# Patient Record
Sex: Female | Born: 1965 | Race: White | Hispanic: No | Marital: Married | Smoking: Never smoker
Health system: Southern US, Community
[De-identification: ages and names within clinical notes are randomized; demographics above are authoritative.]

## PROBLEM LIST (undated history)

## (undated) DIAGNOSIS — G43109 Migraine with aura, not intractable, without status migrainosus: Secondary | ICD-10-CM

## (undated) DIAGNOSIS — F32A Depression, unspecified: Secondary | ICD-10-CM

## (undated) DIAGNOSIS — D649 Anemia, unspecified: Secondary | ICD-10-CM

## (undated) DIAGNOSIS — N390 Urinary tract infection, site not specified: Secondary | ICD-10-CM

## (undated) DIAGNOSIS — K219 Gastro-esophageal reflux disease without esophagitis: Secondary | ICD-10-CM

## (undated) DIAGNOSIS — I10 Essential (primary) hypertension: Secondary | ICD-10-CM

## (undated) DIAGNOSIS — R030 Elevated blood-pressure reading, without diagnosis of hypertension: Secondary | ICD-10-CM

## (undated) DIAGNOSIS — F329 Major depressive disorder, single episode, unspecified: Secondary | ICD-10-CM

## (undated) DIAGNOSIS — Z8489 Family history of other specified conditions: Secondary | ICD-10-CM

## (undated) DIAGNOSIS — K900 Celiac disease: Secondary | ICD-10-CM

## (undated) HISTORY — DX: Celiac disease: K90.0

## (undated) HISTORY — PX: EXPLORATORY LAPAROTOMY: SUR591

## (undated) HISTORY — PX: BLADDER SUSPENSION: SHX72

## (undated) HISTORY — DX: Essential (primary) hypertension: I10

## (undated) HISTORY — DX: Migraine with aura, not intractable, without status migrainosus: G43.109

## (undated) HISTORY — DX: Gastro-esophageal reflux disease without esophagitis: K21.9

## (undated) HISTORY — PX: ABDOMINAL HYSTERECTOMY: SHX81

## (undated) HISTORY — PX: TUBAL LIGATION: SHX77

## (undated) HISTORY — DX: Urinary tract infection, site not specified: N39.0

---
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
## 1997-11-26 ENCOUNTER — Emergency Department (HOSPITAL_COMMUNITY): Admission: EM | Admit: 1997-11-26 | Discharge: 1997-11-26 | Payer: Self-pay | Admitting: Emergency Medicine

## 1998-05-04 ENCOUNTER — Emergency Department (HOSPITAL_COMMUNITY): Admission: EM | Admit: 1998-05-04 | Discharge: 1998-05-04 | Payer: Self-pay | Admitting: Emergency Medicine

## 1998-06-11 ENCOUNTER — Other Ambulatory Visit: Admission: RE | Admit: 1998-06-11 | Discharge: 1998-06-11 | Payer: Self-pay | Admitting: Emergency Medicine

## 1999-07-09 ENCOUNTER — Emergency Department (HOSPITAL_COMMUNITY): Admission: EM | Admit: 1999-07-09 | Discharge: 1999-07-10 | Payer: Self-pay | Admitting: Emergency Medicine

## 2000-12-02 ENCOUNTER — Emergency Department (HOSPITAL_COMMUNITY): Admission: EM | Admit: 2000-12-02 | Discharge: 2000-12-02 | Payer: Self-pay | Admitting: Emergency Medicine

## 2005-01-06 ENCOUNTER — Ambulatory Visit (HOSPITAL_COMMUNITY): Admission: RE | Admit: 2005-01-06 | Discharge: 2005-01-06 | Payer: Self-pay | Admitting: Urology

## 2005-01-06 ENCOUNTER — Ambulatory Visit (HOSPITAL_BASED_OUTPATIENT_CLINIC_OR_DEPARTMENT_OTHER): Admission: RE | Admit: 2005-01-06 | Discharge: 2005-01-06 | Payer: Self-pay | Admitting: Urology

## 2005-09-12 ENCOUNTER — Ambulatory Visit: Payer: Self-pay | Admitting: Internal Medicine

## 2005-09-12 ENCOUNTER — Inpatient Hospital Stay (HOSPITAL_COMMUNITY): Admission: AD | Admit: 2005-09-12 | Discharge: 2005-09-14 | Payer: Self-pay | Admitting: Family Medicine

## 2010-07-01 ENCOUNTER — Emergency Department (HOSPITAL_COMMUNITY)
Admission: EM | Admit: 2010-07-01 | Discharge: 2010-07-01 | Disposition: A | Discharge status: Home / Self Care | Payer: Self-pay | Attending: Emergency Medicine | Admitting: Emergency Medicine

## 2010-07-01 DIAGNOSIS — Y92009 Unspecified place in unspecified non-institutional (private) residence as the place of occurrence of the external cause: Secondary | ICD-10-CM | POA: Insufficient documentation

## 2010-07-01 DIAGNOSIS — S058X9A Other injuries of unspecified eye and orbit, initial encounter: Secondary | ICD-10-CM | POA: Insufficient documentation

## 2010-07-01 DIAGNOSIS — T1590XA Foreign body on external eye, part unspecified, unspecified eye, initial encounter: Secondary | ICD-10-CM | POA: Insufficient documentation

## 2011-04-09 ENCOUNTER — Ambulatory Visit (INDEPENDENT_AMBULATORY_CARE_PROVIDER_SITE_OTHER): Payer: BC Managed Care – PPO

## 2011-04-09 DIAGNOSIS — J45909 Unspecified asthma, uncomplicated: Secondary | ICD-10-CM

## 2011-04-11 ENCOUNTER — Ambulatory Visit (INDEPENDENT_AMBULATORY_CARE_PROVIDER_SITE_OTHER): Payer: BC Managed Care – PPO

## 2011-04-11 DIAGNOSIS — R05 Cough: Secondary | ICD-10-CM

## 2011-04-11 DIAGNOSIS — F411 Generalized anxiety disorder: Secondary | ICD-10-CM

## 2011-04-11 DIAGNOSIS — R059 Cough, unspecified: Secondary | ICD-10-CM

## 2011-04-11 DIAGNOSIS — S61209A Unspecified open wound of unspecified finger without damage to nail, initial encounter: Secondary | ICD-10-CM

## 2011-04-19 ENCOUNTER — Ambulatory Visit (INDEPENDENT_AMBULATORY_CARE_PROVIDER_SITE_OTHER): Payer: BC Managed Care – PPO

## 2011-04-19 DIAGNOSIS — S61209A Unspecified open wound of unspecified finger without damage to nail, initial encounter: Secondary | ICD-10-CM

## 2011-04-20 ENCOUNTER — Ambulatory Visit (INDEPENDENT_AMBULATORY_CARE_PROVIDER_SITE_OTHER): Payer: BC Managed Care – PPO

## 2011-04-20 DIAGNOSIS — R300 Dysuria: Secondary | ICD-10-CM

## 2011-04-20 DIAGNOSIS — G43709 Chronic migraine without aura, not intractable, without status migrainosus: Secondary | ICD-10-CM

## 2011-04-20 DIAGNOSIS — R112 Nausea with vomiting, unspecified: Secondary | ICD-10-CM

## 2011-05-18 ENCOUNTER — Ambulatory Visit: Payer: BC Managed Care – PPO

## 2011-05-18 ENCOUNTER — Ambulatory Visit (INDEPENDENT_AMBULATORY_CARE_PROVIDER_SITE_OTHER): Payer: BC Managed Care – PPO | Admitting: Family Medicine

## 2011-05-18 VITALS — BP 120/88 | HR 60 | Temp 98.7°F | Resp 16 | Ht 60.25 in | Wt 154.0 lb

## 2011-05-18 DIAGNOSIS — R112 Nausea with vomiting, unspecified: Secondary | ICD-10-CM

## 2011-05-18 DIAGNOSIS — K5900 Constipation, unspecified: Secondary | ICD-10-CM

## 2011-05-18 LAB — GLUCOSE, POCT (MANUAL RESULT ENTRY): POC Glucose: 80

## 2011-05-18 MED ORDER — PROMETHAZINE HCL 25 MG PO TABS: 25.0000 mg | ORAL_TABLET | Freq: Three times a day (TID) | ORAL | Status: DC | PRN

## 2011-05-18 NOTE — Progress Notes (Signed)
  Subjective:    Patient ID: [name], female    DOB: Sep 03, 1965, 46 y.o.   MRN: [id number]  HPI 46 yo female with c/o nausea for 3-4 months.  Daily.  Gags, occasionally vomits.  Comes and goes throughout the day.  Worse with movement, worse with bending over.  Better with sitting up and sitting still.  Sometimes will last several hours.  No relation to food - doesn't help or hurt.  No associated abdominal pain.  H/O migraines, decreased in frequency to 1-2/month since starting atenolol.  Sometimes feels lightheaded or weak with it.  No visual disturbance.  Frequent constipation - has had colonsocopy in last 1-2 years.  Was told to take miralax and dulcolax daily.  Nausea improved for awhile on them but then she stopped taking them and nausea returned.   Also feels heartburn symptoms with the nausea.     Review of Systems Negative except as per HPI     Objective:   Physical Exam  Constitutional: Vital signs are normal. She appears well-developed and well-nourished. She is active.  Cardiovascular: Normal rate, regular rhythm, normal heart sounds and normal pulses.   Pulmonary/Chest: Effort normal and breath sounds normal.  Abdominal: Soft. Normal appearance and bowel sounds are normal. She exhibits no distension and no mass. There is no hepatosplenomegaly. There is tenderness. There is no rigidity, no rebound, no guarding, no CVA tenderness, no tenderness at McBurney's point and negative Murphy's sign. No hernia.  Neurological: She is alert.   Abd tenderness is mild and generalized  [facility] Primary radiology reading by Dr. Georgiana Shore: mild scoliosis noted.  Heavy stool load - constipation.      Assessment & Plan:  Nausea/vomitting, exacerbated by a nything that compresses abdominal cavity.  Suspect due to constipation (history of this).  Resume miralax, dulcolax as directed by GI.  Phenergan for nausea at night while treating constipation. Avoid zofran due to constipating side effect.  If no  improvement in 10-14 days, RTC.

## 2011-05-28 ENCOUNTER — Encounter: Payer: Self-pay | Admitting: Physician Assistant

## 2011-05-28 ENCOUNTER — Ambulatory Visit (INDEPENDENT_AMBULATORY_CARE_PROVIDER_SITE_OTHER): Payer: BC Managed Care – PPO | Admitting: Physician Assistant

## 2011-05-28 VITALS — BP 114/70 | HR 76 | Temp 98.0°F | Resp 16 | Ht 60.5 in | Wt 156.0 lb

## 2011-05-28 DIAGNOSIS — R112 Nausea with vomiting, unspecified: Secondary | ICD-10-CM

## 2011-05-28 MED ORDER — METOCLOPRAMIDE HCL 5 MG PO TABS: ORAL_TABLET | ORAL | Status: DC

## 2011-05-28 NOTE — Progress Notes (Signed)
  Subjective:    Patient ID: [name], female    DOB: [birth year], 46 y.o.   MRN: [id number]  HPI    Review of Systems     Objective:   Physical Exam        Assessment & Plan:

## 2011-05-28 NOTE — Progress Notes (Signed)
  Subjective:    Patient ID: [name], female    DOB: 05/13/1965, 46 y.o.   MRN: [id number]  HPI Tariah is here for recheck of n/v. States her symptoms worsened last night.  More frequent and prolonged episodes. Phenergan has not helped. Episodes occur mainly at night after dinner despite size of meal.  Has used Dulcolax and Mag Citrate for constipation recently as she felt more bloated higher up in abdomen.   She has long h/o constipation.   Review of Systems  Constitutional: Negative for fever and chills.  Gastrointestinal: Positive for nausea, vomiting, abdominal pain, constipation and abdominal distention. Negative for blood in stool.  Musculoskeletal: Negative for myalgias.  Neurological: Negative for headaches.       Objective:   Physical Exam  Constitutional: She appears well-developed and well-nourished.  HENT:  Mouth/Throat: Oropharynx is clear and moist.  Cardiovascular: Normal rate and regular rhythm.   Pulmonary/Chest: Effort normal and breath sounds normal.  Abdominal: Soft. Normal appearance and bowel sounds are normal. There is no splenomegaly or hepatomegaly. There is no tenderness. There is no rebound and no CVA tenderness.          Assessment & Plan:  Nausea and vomiting, prolonged for 4 months. ?gastroparesis Constipation, ongoing  Recommend referral to [facility].  D/C phenergan  Start Reglan at night before meals

## 2011-06-25 ENCOUNTER — Ambulatory Visit (INDEPENDENT_AMBULATORY_CARE_PROVIDER_SITE_OTHER): Payer: BC Managed Care – PPO | Admitting: Family Medicine

## 2011-06-25 VITALS — BP 134/86 | HR 87 | Temp 97.5°F | Resp 16 | Ht 60.5 in | Wt 157.0 lb

## 2011-06-25 DIAGNOSIS — M25511 Pain in right shoulder: Secondary | ICD-10-CM

## 2011-06-25 DIAGNOSIS — K5909 Other constipation: Secondary | ICD-10-CM | POA: Insufficient documentation

## 2011-06-25 DIAGNOSIS — K5900 Constipation, unspecified: Secondary | ICD-10-CM

## 2011-06-25 DIAGNOSIS — M25519 Pain in unspecified shoulder: Secondary | ICD-10-CM

## 2011-06-25 MED ORDER — METHYLPREDNISOLONE 4 MG PO KIT: PACK | ORAL | Status: AC

## 2011-06-25 NOTE — Patient Instructions (Signed)
culturelle   Constipation in Adults Constipation is having fewer than 2 bowel movements per week. Usually, the stools are hard. As we grow older, constipation is more common. If you try to fix constipation with laxatives, the problem may get worse. This is because laxatives taken over a long period of time make the colon muscles weaker. A low-fiber diet, not taking in enough fluids, and taking some medicines may make these problems worse. MEDICATIONS THAT MAY CAUSE CONSTIPATION  Water pills (diuretics).   Calcium channel blockers (used to control blood pressure and for the heart).   Certain pain medicines (narcotics).   Anticholinergics.   Anti-inflammatory agents.   Antacids that contain aluminum.  DISEASES THAT CONTRIBUTE TO CONSTIPATION  Diabetes.   Parkinson's disease.   Dementia.   Stroke.   Depression.   Illnesses that cause problems with salt and water metabolism.  HOME CARE INSTRUCTIONS   Constipation is usually best cared for without medicines. Increasing dietary fiber and eating more fruits and vegetables is the best way to manage constipation.   Slowly increase fiber intake to 25 to 38 grams per day. Whole grains, fruits, vegetables, and legumes are good sources of fiber. A dietitian can further help you incorporate high-fiber foods into your diet.   Drink enough water and fluids to keep your urine clear or pale yellow.   A fiber supplement may be added to your diet if you cannot get enough fiber from foods.   Increasing your activities also helps improve regularity.   Suppositories, as suggested by your caregiver, will also help. If you are using antacids, such as aluminum or calcium containing products, it will be helpful to switch to products containing magnesium if your caregiver says it is okay.   If you have been given a liquid injection (enema) today, this is only a temporary measure. It should not be relied on for treatment of longstanding (chronic)  constipation.   Stronger measures, such as magnesium sulfate, should be avoided if possible. This may cause uncontrollable diarrhea. Using magnesium sulfate may not allow you time to make it to the bathroom.  SEEK IMMEDIATE MEDICAL CARE IF:   There is bright red blood in the stool.   The constipation stays for more than 4 days.   There is belly (abdominal) or rectal pain.   You do not seem to be getting better.   You have any questions or concerns.  MAKE SURE YOU:   Understand these instructions.   Will watch your condition.   Will get help right away if you are not doing well or get worse.  Document Released: 12/26/2003 Document Revised: 03/18/2011 Document Reviewed: 03/02/2011 [facility] Patient Information 2012 [place], [place].

## 2011-06-25 NOTE — Progress Notes (Signed)
46-year-old woman with 6 months of right shoulder pain. The pain is intermittent and usually comes when she is moving her shoulder by reaching behind her or overhead. Occasion the pain is anterior in nature the pain is sharp. Remember any specific injury that precipitated the pain.  Patient also notes chronic constipation. MiraLax which has done very little to help her. She's also taking Correctol and other over-the-counter laxatives which have worked better he stands up these cause chronic long-term problems. She's had a colonoscopy in the past which was unrevealing. She has not had a bowel movement in the last week. Directive:  Objective: Patient is in no acute distress, she is cooperative and friendly  Examination of the right shoulder reveals full range of motion active and passive, inspection revealed no bony normality. Palpation reveals tenderness in the anterior joint line. Resistance testing shows pain with external rotation.  Abdominal exam is negative  Skin is warm and dry   Assessment: Right shoulder tendinitis and chronic constipation  Plan: GI referral and trial of culture lle  Medrol Dosepak

## 2011-07-30 ENCOUNTER — Emergency Department (HOSPITAL_COMMUNITY): Payer: BC Managed Care – PPO

## 2011-07-30 ENCOUNTER — Encounter (HOSPITAL_COMMUNITY): Payer: Self-pay | Admitting: Emergency Medicine

## 2011-07-30 ENCOUNTER — Emergency Department (HOSPITAL_COMMUNITY)
Admission: EM | Admit: 2011-07-30 | Discharge: 2011-07-31 | Disposition: A | Discharge status: Home / Self Care | Payer: BC Managed Care – PPO | Attending: Emergency Medicine | Admitting: Emergency Medicine

## 2011-07-30 DIAGNOSIS — K921 Melena: Secondary | ICD-10-CM | POA: Insufficient documentation

## 2011-07-30 DIAGNOSIS — R112 Nausea with vomiting, unspecified: Secondary | ICD-10-CM | POA: Insufficient documentation

## 2011-07-30 DIAGNOSIS — K5900 Constipation, unspecified: Secondary | ICD-10-CM | POA: Insufficient documentation

## 2011-07-30 DIAGNOSIS — K5909 Other constipation: Secondary | ICD-10-CM

## 2011-07-30 LAB — DIFFERENTIAL
Basophils Absolute: 0 10*3/uL (ref 0.0–0.1)
Basophils Relative: 0 % (ref 0–1)
Eosinophils Absolute: 0 10*3/uL (ref 0.0–0.7)
Eosinophils Relative: 0 % (ref 0–5)
Lymphocytes Relative: 9 % — ABNORMAL LOW (ref 12–46)
Lymphs Abs: 0.5 10*3/uL — ABNORMAL LOW (ref 0.7–4.0)
Monocytes Absolute: 0.2 10*3/uL (ref 0.1–1.0)
Monocytes Relative: 3 % (ref 3–12)
Neutro Abs: 5.3 10*3/uL (ref 1.7–7.7)
Neutrophils Relative %: 88 % — ABNORMAL HIGH (ref 43–77)

## 2011-07-30 LAB — BASIC METABOLIC PANEL
BUN: 9 mg/dL (ref 6–23)
CO2: 26 mEq/L (ref 19–32)
Calcium: 9 mg/dL (ref 8.4–10.5)
Chloride: 103 mEq/L (ref 96–112)
Creatinine, Ser: 0.7 mg/dL (ref 0.50–1.10)
GFR calc Af Amer: >90 mL/min (ref >90)
GFR calc non Af Amer: >90 mL/min (ref >90)
Glucose, Bld: 92 mg/dL (ref 70–99)
Potassium: 3.5 mEq/L (ref 3.5–5.1)
Sodium: 137 mEq/L (ref 135–145)

## 2011-07-30 LAB — LIPASE, BLOOD: Lipase: 23 U/L (ref 11–59)

## 2011-07-30 LAB — URINALYSIS, ROUTINE W REFLEX MICROSCOPIC
Bilirubin Urine: NEGATIVE
Glucose, UA: NEGATIVE mg/dL
Hgb urine dipstick: NEGATIVE
Ketones, ur: 15 mg/dL — AB
Nitrite: NEGATIVE
Protein, ur: NEGATIVE mg/dL
Specific Gravity, Urine: 1.02 (ref 1.005–1.030)
Urobilinogen, UA: 1 mg/dL (ref 0.0–1.0)
pH: 7.5 (ref 5.0–8.0)

## 2011-07-30 LAB — HEPATIC FUNCTION PANEL
ALT: 11 U/L (ref 0–35)
AST: 13 U/L (ref 0–37)
Albumin: 3.2 g/dL — ABNORMAL LOW (ref 3.5–5.2)
Alkaline Phosphatase: 67 U/L (ref 39–117)
Bilirubin, Direct: 0.1 mg/dL (ref 0.0–0.3)
Indirect Bilirubin: 0.4 mg/dL (ref 0.3–0.9)
Total Bilirubin: 0.5 mg/dL (ref 0.3–1.2)
Total Protein: 6 g/dL (ref 6.0–8.3)

## 2011-07-30 LAB — URINE MICROSCOPIC-ADD ON

## 2011-07-30 LAB — CBC
HCT: 44.1 % (ref 36.0–46.0)
Hemoglobin: 15.1 g/dL — ABNORMAL HIGH (ref 12.0–15.0)
MCH: 33.2 pg (ref 26.0–34.0)
MCHC: 34.2 g/dL (ref 30.0–36.0)
MCV: 96.9 fL (ref 78.0–100.0)
Platelets: 215 10*3/uL (ref 150–400)
RBC: 4.55 MIL/uL (ref 3.87–5.11)
RDW: 12.4 % (ref 11.5–15.5)
WBC: 6 10*3/uL (ref 4.0–10.5)

## 2011-07-30 LAB — POCT PREGNANCY, URINE: Preg Test, Ur: NEGATIVE

## 2011-07-30 LAB — OCCULT BLOOD, POC DEVICE: Fecal Occult Bld: NEGATIVE

## 2011-07-30 MED ORDER — SODIUM CHLORIDE 0.9 % IV BOLUS (SEPSIS)
1000.0000 mL | Freq: Once | INTRAVENOUS | Status: AC
  Administered 2011-07-30: 1000 mL via INTRAVENOUS

## 2011-07-30 MED ORDER — ONDANSETRON HCL 4 MG/2ML IJ SOLN
4.0000 mg | Freq: Once | INTRAMUSCULAR | Status: AC
  Administered 2011-07-30: 4 mg via INTRAVENOUS
  Filled 2011-07-30: qty 2

## 2011-07-30 MED ORDER — ACETAMINOPHEN 325 MG PO TABS
650.0000 mg | ORAL_TABLET | Freq: Once | ORAL | Status: AC
  Administered 2011-07-30: 650 mg via ORAL
  Filled 2011-07-30: qty 2

## 2011-07-30 MED ORDER — MORPHINE SULFATE 4 MG/ML IJ SOLN
4.0000 mg | Freq: Once | INTRAMUSCULAR | Status: AC
  Administered 2011-07-30: 4 mg via INTRAVENOUS
  Filled 2011-07-30: qty 1

## 2011-07-30 NOTE — ED Notes (Signed)
PT states unable to provide urine at this time

## 2011-07-30 NOTE — ED Provider Notes (Signed)
History     CSN: [id number]  Arrival date & time 07/30/11  Lurline Hare   First MD Initiated Contact with Patient 07/30/11 2108      Chief Complaint  Patient presents with  . Emesis    (Consider location/radiation/quality/duration/timing/severity/associated sxs/prior treatment) HPI  46 year old female with history of chronic constipation presents with a chief complaints of constipation. Patient states she does have a history of chronic constipation for the past year. She has been seen by a primary care Dr. multiple times for it. She has been receiving several different types of stool softener to help with the symptoms. She has also been scheduled to have a colonoscopy and endoscopy on April 29 by a GI doctor. However for the past several days she has had increased nausea, vomiting with green bile and had trace of blood in her stool. States that Dr. is aware of her rectal bleeding and accounts to straining. Her last bowel movement was today but a very small amount. Due to vomiting biles, patient was recommended by her sister to come to ED for further evaluation. Patient states she still has her gallbladder intact. Patient denies fever, chills, chest pain, shortness of breath, back pain, dysuria, or rash. She has not had any appetite.  History reviewed. No pertinent past medical history.  History reviewed. No pertinent past surgical history.  No family history on file.  History  Substance Use Topics  . Smoking status: Never Smoker   . Smokeless tobacco: Not on file  . Alcohol Use: No    OB History    Grav Para Term Preterm Abortions TAB SAB Ect Mult Living                  Review of Systems  All other systems reviewed and are negative.    Allergies  Review of patient's allergies indicates no known allergies.  Home Medications   Current Outpatient Rx  Name Route Sig Dispense Refill  . ATENOLOL 25 MG PO TABS Oral Take 25 mg by mouth daily.     . BUPROPION HCL ER (SR) 150 MG PO  TB12 Oral Take 150 mg by mouth 2 (two) times daily.    Marland Kitchen HYOSCYAMINE SULFATE 0.125 MG PO TABS Oral Take 0.125-0.25 mg by mouth every 4 (four) hours as needed. For stomach cramping    . METOCLOPRAMIDE HCL 5 MG PO TABS Oral Take 5 mg by mouth See admin instructions. May take 1 tablet  1-2 hours before dinner and may repeat at bedtime if needed for nausea    . ONDANSETRON 4 MG PO TBDP Oral Take 4 mg by mouth every 8 (eight) hours as needed. For nausea    . PROMETHAZINE HCL 25 MG RE SUPP Rectal Place 25 mg rectally every 4 (four) hours as needed. For nausea    . PROMETHAZINE HCL 25 MG PO TABS Oral Take 1 tablet (25 mg total) by mouth every 8 (eight) hours as needed for nausea. 20 tablet 0    BP 120/70  Pulse 88  Temp(Src) 100.1 F (37.8 C) (Oral)  Resp 20  SpO2 94%  LMP 07/21/2011  Physical Exam  Nursing note and vitals reviewed. Constitutional: She appears well-developed and well-nourished. No distress.       Awake, alert, nontoxic appearance  HENT:  Head: Atraumatic.  Eyes: Conjunctivae are normal. Right eye exhibits no discharge. Left eye exhibits no discharge.  Neck: Neck supple.  Cardiovascular: Normal rate and regular rhythm.   Pulmonary/Chest: Effort normal. No respiratory distress.  She exhibits no tenderness.  Abdominal: Soft. There is tenderness in the suprapubic area and left lower quadrant. There is no rebound, no tenderness at McBurney's point and negative Murphy's sign. No hernia. Hernia confirmed negative in the ventral area.  Musculoskeletal: She exhibits no tenderness.       ROM appears intact, no obvious focal weakness  Neurological:       Mental status and motor strength appears intact  Skin: No rash noted.  Psychiatric: She has a normal mood and affect.    ED Course  Procedures (including critical care time)  Labs Reviewed  CBC - Abnormal; Notable for the following:    Hemoglobin 15.1 (*)    All other components within normal limits  DIFFERENTIAL - Abnormal;  Notable for the following:    Neutrophils Relative 88 (*)    Lymphocytes Relative 9 (*)    Lymphs Abs 0.5 (*)    All other components within normal limits  BASIC METABOLIC PANEL  URINALYSIS, ROUTINE W REFLEX MICROSCOPIC   No results found.   No diagnosis found.  Results for orders placed during the hospital encounter of 07/30/11  URINALYSIS, ROUTINE W REFLEX MICROSCOPIC      Component Value Range   Color, Urine YELLOW  YELLOW    APPearance CLOUDY (*) CLEAR    Specific Gravity, Urine 1.020  1.005 - 1.030    pH 7.5  5.0 - 8.0    Glucose, UA NEGATIVE  NEGATIVE (mg/dL)   Hgb urine dipstick NEGATIVE  NEGATIVE    Bilirubin Urine NEGATIVE  NEGATIVE    Ketones, ur 15 (*) NEGATIVE (mg/dL)   Protein, ur NEGATIVE  NEGATIVE (mg/dL)   Urobilinogen, UA 1.0  0.0 - 1.0 (mg/dL)   Nitrite NEGATIVE  NEGATIVE    Leukocytes, UA MODERATE (*) NEGATIVE   CBC      Component Value Range   WBC 6.0  4.0 - 10.5 (K/uL)   RBC 4.55  3.87 - 5.11 (MIL/uL)   Hemoglobin 15.1 (*) 12.0 - 15.0 (g/dL)   HCT 44.1  36.0 - 46.0 (%)   MCV 96.9  78.0 - 100.0 (fL)   MCH 33.2  26.0 - 34.0 (pg)   MCHC 34.2  30.0 - 36.0 (g/dL)   RDW 12.4  11.5 - 15.5 (%)   Platelets 215  150 - 400 (K/uL)  DIFFERENTIAL      Component Value Range   Neutrophils Relative 88 (*) 43 - 77 (%)   Neutro Abs 5.3  1.7 - 7.7 (K/uL)   Lymphocytes Relative 9 (*) 12 - 46 (%)   Lymphs Abs 0.5 (*) 0.7 - 4.0 (K/uL)   Monocytes Relative 3  3 - 12 (%)   Monocytes Absolute 0.2  0.1 - 1.0 (K/uL)   Eosinophils Relative 0  0 - 5 (%)   Eosinophils Absolute 0.0  0.0 - 0.7 (K/uL)   Basophils Relative 0  0 - 1 (%)   Basophils Absolute 0.0  0.0 - 0.1 (K/uL)  BASIC METABOLIC PANEL      Component Value Range   Sodium 137  135 - 145 (mEq/L)   Potassium 3.5  3.5 - 5.1 (mEq/L)   Chloride 103  96 - 112 (mEq/L)   CO2 26  19 - 32 (mEq/L)   Glucose, Bld 92  70 - 99 (mg/dL)   BUN 9  6 - 23 (mg/dL)   Creatinine, Ser 0.70  0.50 - 1.10 (mg/dL)   Calcium 9.0  8.4  - 10.5 (mg/dL)  GFR calc non Af Amer >90  >90 (mL/min)   GFR calc Af Amer >90  >90 (mL/min)  HEPATIC FUNCTION PANEL      Component Value Range   Total Protein 6.0  6.0 - 8.3 (g/dL)   Albumin 3.2 (*) 3.5 - 5.2 (g/dL)   AST 13  0 - 37 (U/L)   ALT 11  0 - 35 (U/L)   Alkaline Phosphatase 67  39 - 117 (U/L)   Total Bilirubin 0.5  0.3 - 1.2 (mg/dL)   Bilirubin, Direct 0.1  0.0 - 0.3 (mg/dL)   Indirect Bilirubin 0.4  0.3 - 0.9 (mg/dL)  LIPASE, BLOOD      Component Value Range   Lipase 23  11 - 59 (U/L)  POCT PREGNANCY, URINE      Component Value Range   Preg Test, Ur NEGATIVE  NEGATIVE   URINE MICROSCOPIC-ADD ON      Component Value Range   Squamous Epithelial / LPF FEW (*) RARE    WBC, UA 0-2  <3 (WBC/hpf)   Bacteria, UA RARE  RARE   OCCULT BLOOD, POC DEVICE      Component Value Range   Fecal Occult Bld NEGATIVE     Dg Abd Acute W/chest  07/30/2011  *RADIOLOGY REPORT*  Clinical Data: Nausea and vomiting.  Constipation.  ACUTE ABDOMEN SERIES (ABDOMEN 2 VIEW & CHEST 1 VIEW)  Comparison: 01/15/2012  Findings: Normal heart size and pulmonary vascularity.  No focal airspace consolidation in the lungs.  No blunting of costophrenic angles.  No pneumothorax.  Scattered gas and stool in the colon and in some loops of small bowel without distension.  Multiple small air-fluid levels predominately in the colon.  Early obstruction with predominately fluid-filled bowel is not excluded but there is no specific evidence of obstruction today.  No free intra-abdominal air.  IMPRESSION: No evidence of gaseous bowel distension.  No free air.  No active pulmonary disease.  Original Report Authenticated By: Neale Burly, M.D.      MDM  Chronic constipation. We'll obtain acute abdomen series to rule out acute obstruction. Patient has abdominal pain with nausea and vomiting, however she has no Murphy sign. The pain localized to the suprapubic and left lower quadrant. We'll obtain UA to rule out urinary  tract infection. IV fluid given, antinausea medication given, and pain medication given.  Will check belly labs.    11:23 PM Rectal exam is unremarkable.  Hemoccult negative.  Her UA shows no acute infection.  NOrmal hepatic function and normal lipase.  Abd nonsurgical.  Pt has stable VS, mildly elevated temp of 100.1.  Tylenol given.  Pt able to tolerates PO.  Acute abd series unremarkable. Will discharge, recommend f/u. Discussed care with my attending who agrees with plan.  Pt agrees to f/u with PCP.  Strict f/u instruction given.       Domenic Moras, PA-C 07/31/11 0023

## 2011-07-30 NOTE — ED Notes (Signed)
Patient with nausea, vomiting, diarrhea and constipation for last three weeks.  Patient has seen Dr Collene Mares for this.  Patient was called in [place] for nausea.  Patient used one, continues with nausea and came to ED.  Patient states she has not been able to urinate very much today and unable to keep liquids down.

## 2011-07-30 NOTE — ED Notes (Signed)
PT. REPORTS EMESIS FOR SEVERAL DAYS WITH CONSTIPATION PRESCRIBED WITH PHENERGAN SUPPOSITORY WITH NO RELIEF , SCHEDULED FOR COLONOSCOPY/ ENDOSCOPY ON APRIL29 BY DR. MANN .

## 2011-07-31 NOTE — ED Provider Notes (Signed)
Medical screening examination/treatment/procedure(s) were performed by non-physician practitioner and as supervising physician I was immediately available for consultation/collaboration.   Lezlie Octave, MD 07/31/11 [phone]

## 2011-07-31 NOTE — Discharge Instructions (Signed)
Please continue with your treatment for constipation.  Follow up with your doctor for further evaluation.  Return to ER if you are unable to keep anything down by mouth or if you have increase abdominal pain, or fever.  Take your antinausea medication as previously prescribed.    Constipation in Adults Constipation is having fewer than 2 bowel movements per week. Usually, the stools are hard. As we grow older, constipation is more common. If you try to fix constipation with laxatives, the problem may get worse. This is because laxatives taken over a long period of time make the colon muscles weaker. A low-fiber diet, not taking in enough fluids, and taking some medicines may make these problems worse. MEDICATIONS THAT MAY CAUSE CONSTIPATION  Water pills (diuretics).   Calcium channel blockers (used to control blood pressure and for the heart).   Certain pain medicines (narcotics).   Anticholinergics.   Anti-inflammatory agents.   Antacids that contain aluminum.  DISEASES THAT CONTRIBUTE TO CONSTIPATION  Diabetes.   Parkinson's disease.   Dementia.   Stroke.   Depression.   Illnesses that cause problems with salt and water metabolism.  HOME CARE INSTRUCTIONS   Constipation is usually best cared for without medicines. Increasing dietary fiber and eating more fruits and vegetables is the best way to manage constipation.   Slowly increase fiber intake to 25 to 38 grams per day. Whole grains, fruits, vegetables, and legumes are good sources of fiber. A dietitian can further help you incorporate high-fiber foods into your diet.   Drink enough water and fluids to keep your urine clear or pale yellow.   A fiber supplement may be added to your diet if you cannot get enough fiber from foods.   Increasing your activities also helps improve regularity.   Suppositories, as suggested by your caregiver, will also help. If you are using antacids, such as aluminum or calcium containing  products, it will be helpful to switch to products containing magnesium if your caregiver says it is okay.   If you have been given a liquid injection (enema) today, this is only a temporary measure. It should not be relied on for treatment of longstanding (chronic) constipation.   Stronger measures, such as magnesium sulfate, should be avoided if possible. This may cause uncontrollable diarrhea. Using magnesium sulfate may not allow you time to make it to the bathroom.  SEEK IMMEDIATE MEDICAL CARE IF:   There is bright red blood in the stool.   The constipation stays for more than 4 days.   There is belly (abdominal) or rectal pain.   You do not seem to be getting better.   You have any questions or concerns.  MAKE SURE YOU:   Understand these instructions.   Will watch your condition.   Will get help right away if you are not doing well or get worse.  Document Released: 12/26/2003 Document Revised: 03/18/2011 Document Reviewed: 03/02/2011 [facility] Patient Information 2012 [place], [place].

## 2011-09-08 ENCOUNTER — Other Ambulatory Visit: Payer: Self-pay | Admitting: Physician Assistant

## 2011-09-19 ENCOUNTER — Ambulatory Visit (INDEPENDENT_AMBULATORY_CARE_PROVIDER_SITE_OTHER): Payer: BC Managed Care – PPO | Admitting: Internal Medicine

## 2011-09-19 VITALS — BP 129/85 | HR 66 | Temp 98.0°F | Resp 16 | Ht 61.0 in | Wt 164.8 lb

## 2011-09-19 DIAGNOSIS — R110 Nausea: Secondary | ICD-10-CM

## 2011-09-19 DIAGNOSIS — F4323 Adjustment disorder with mixed anxiety and depressed mood: Secondary | ICD-10-CM | POA: Insufficient documentation

## 2011-09-19 DIAGNOSIS — G43911 Migraine, unspecified, intractable, with status migrainosus: Secondary | ICD-10-CM | POA: Insufficient documentation

## 2011-09-19 DIAGNOSIS — G43909 Migraine, unspecified, not intractable, without status migrainosus: Secondary | ICD-10-CM

## 2011-09-19 DIAGNOSIS — G43109 Migraine with aura, not intractable, without status migrainosus: Secondary | ICD-10-CM | POA: Insufficient documentation

## 2011-09-19 DIAGNOSIS — F329 Major depressive disorder, single episode, unspecified: Secondary | ICD-10-CM | POA: Insufficient documentation

## 2011-09-19 MED ORDER — ONDANSETRON 4 MG PO TBDP: ORAL_TABLET | ORAL | Status: DC

## 2011-09-19 MED ORDER — KETOROLAC TROMETHAMINE 60 MG/2ML IM SOLN
60.0000 mg | Freq: Once | INTRAMUSCULAR | Status: AC
  Administered 2011-09-19: 60 mg via INTRAMUSCULAR

## 2011-09-19 MED ORDER — ONDANSETRON HCL 4 MG/2ML IJ SOLN: 4.0000 mg | Freq: Once | INTRAMUSCULAR | Status: DC

## 2011-09-19 MED ORDER — ONDANSETRON 4 MG PO TBDP
8.0000 mg | ORAL_TABLET | Freq: Once | ORAL | Status: AC
  Administered 2011-09-19: 8 mg via ORAL

## 2011-09-19 MED ORDER — HYDROCODONE-ACETAMINOPHEN 5-500 MG PO TABS: 1.0000 | ORAL_TABLET | Freq: Four times a day (QID) | ORAL | Status: AC | PRN

## 2011-09-20 ENCOUNTER — Encounter: Payer: Self-pay | Admitting: Internal Medicine

## 2011-09-20 NOTE — Progress Notes (Signed)
  Subjective:    Patient ID: [name], female    DOB: 01-21-1966, 46 y.o.   MRN: [id number]  [facility] had a typical headache for her migraine syndrome for the past 48 hours, getting worse this morning with progression to nausea but no vomiting. She denies vision changes, dizziness, or any other neurological symptoms. The frequency of her migraines has decreased to 1-2 per month with the addition of a beta blocker atenolol. She often will respond to Excedrin Migraine but not this time.    Review of SystemsThere is no recent weight change or fatigue. No fever or chills. No congestion symptoms or hearing loss. Cardiovascular systems unremarkable No change in urination No joint problems Neuropsychiatric symptoms     Objective:   Physical ExamVital signs are stable except overweight Pupils equal round reactive to light and accommodation She is photophobic/ EOMs conjugate The neck is supple Heart is regular at 85 Neurological is otherwise intact, Including cranial nerves, deep tendon reflexes, gait, and orientation.        Assessment & Plan:  Problem #1 migraine headache syndrome Toradol 60 mg IM Zofran 8 mg ODT  Meds ordered this encounter  Medications  .     Home to rest with rescue medications if needed  .     Continue atenolol  .       Marland Kitchen HYDROcodone-acetaminophen (VICODIN) 5-500 MG per tablet    Sig: Take 1 tablet by mouth every 6 (six) hours as needed for pain.    Dispense:  12 tablet    Refill:  0  . ondansetron (ZOFRAN-ODT) 4 MG disintegrating tablet    Sig: One or 2 tabs every 6 hours as needed for nausea.    Dispense:  12 tablet    Refill:  0    You may change this to the non-orally disintegrating tablet form if it's too expensive

## 2011-11-06 ENCOUNTER — Other Ambulatory Visit: Payer: Self-pay | Admitting: Physician Assistant

## 2012-02-25 ENCOUNTER — Ambulatory Visit (INDEPENDENT_AMBULATORY_CARE_PROVIDER_SITE_OTHER): Payer: BC Managed Care – PPO | Admitting: Family Medicine

## 2012-02-25 VITALS — BP 145/98 | HR 91 | Temp 98.6°F | Resp 16 | Ht 61.0 in | Wt 162.2 lb

## 2012-02-25 DIAGNOSIS — M545 Low back pain, unspecified: Secondary | ICD-10-CM

## 2012-02-25 DIAGNOSIS — E860 Dehydration: Secondary | ICD-10-CM

## 2012-02-25 DIAGNOSIS — R112 Nausea with vomiting, unspecified: Secondary | ICD-10-CM

## 2012-02-25 DIAGNOSIS — N390 Urinary tract infection, site not specified: Secondary | ICD-10-CM

## 2012-02-25 DIAGNOSIS — J029 Acute pharyngitis, unspecified: Secondary | ICD-10-CM

## 2012-02-25 LAB — POCT URINALYSIS DIPSTICK
Glucose, UA: NEGATIVE
Ketones, UA: 40
Nitrite, UA: NEGATIVE
Protein, UA: 30
Spec Grav, UA: 1.025
Urobilinogen, UA: 1
pH, UA: 7

## 2012-02-25 LAB — POCT CBC
Granulocyte percent: 74.5 %G (ref 37–80)
HCT, POC: 49 % — AB (ref 37.7–47.9)
Hemoglobin: 15.3 g/dL (ref 12.2–16.2)
Lymph, poc: 1.3 (ref 0.6–3.4)
MCH, POC: 31.7 pg — AB (ref 27–31.2)
MCHC: 31.2 g/dL — AB (ref 31.8–35.4)
MCV: 101.5 fL — AB (ref 80–97)
MID (cbc): 0.5 (ref 0–0.9)
MPV: 10 fL (ref 0–99.8)
POC Granulocyte: 5.1 (ref 2–6.9)
POC LYMPH PERCENT: 18.7 %L (ref 10–50)
POC MID %: 6.8 %M (ref 0–12)
Platelet Count, POC: 282 10*3/uL (ref 142–424)
RBC: 4.83 M/uL (ref 4.04–5.48)
RDW, POC: 13 %
WBC: 6.8 10*3/uL (ref 4.6–10.2)

## 2012-02-25 LAB — POCT UA - MICROSCOPIC ONLY
Casts, Ur, LPF, POC: NEGATIVE
Crystals, Ur, HPF, POC: NEGATIVE
Mucus, UA: POSITIVE
Yeast, UA: NEGATIVE

## 2012-02-25 LAB — POCT URINE PREGNANCY: Preg Test, Ur: NEGATIVE

## 2012-02-25 LAB — POCT RAPID STREP A (OFFICE): Rapid Strep A Screen: NEGATIVE

## 2012-02-25 MED ORDER — CEFTRIAXONE SODIUM 1 G IJ SOLR
1.0000 g | INTRAMUSCULAR | Status: DC
  Administered 2012-02-25: 1 g via INTRAMUSCULAR

## 2012-02-25 MED ORDER — PROMETHAZINE HCL 25 MG PO TABS: 25.0000 mg | ORAL_TABLET | Freq: Three times a day (TID) | ORAL | Status: DC | PRN

## 2012-02-25 MED ORDER — CYCLOBENZAPRINE HCL 10 MG PO TABS: 10.0000 mg | ORAL_TABLET | Freq: Two times a day (BID) | ORAL | Status: DC | PRN

## 2012-02-25 MED ORDER — NITROFURANTOIN MONOHYD MACRO 100 MG PO CAPS: 100.0000 mg | ORAL_CAPSULE | Freq: Two times a day (BID) | ORAL | Status: DC

## 2012-02-25 MED ORDER — PROMETHAZINE HCL 25 MG/ML IJ SOLN
12.5000 mg | Freq: Once | INTRAMUSCULAR | Status: AC
  Administered 2012-02-25: 12.5 mg via INTRAMUSCULAR

## 2012-02-25 NOTE — Patient Instructions (Addendum)
Start the [employer] tomorrow for your UTI.  You may use phenergan as needed for nausea.  You can use flexeril for muscle pain in your back, but be cautious of sedation.    If you are not feeling better tomorrow please come back in.  If you get worse overnight go to the ED.

## 2012-02-25 NOTE — Progress Notes (Addendum)
[facility] [address] [place] [address] [phone]  Date:  02/25/2012   Name:  [name]   DOB:  [birth year]   MRN:  [id number]  PCP:  Sheila Oats, MD    Chief Complaint: Back Pain, Nausea, Emesis, Diarrhea and Sore Throat   History of Present Illness:  [name] is a 47 y.o. very pleasant female patient who presents with the following:  Today is Friday. She came down with flu- like symptoms this past Tuesday- she had a ST, sore cervical glands.  She then had onset of emesis Tuesday night/ early Wednesday morning.  She is still having vomiting off and on.  She has not been able to keep down food.  She is not able to tolerate much liquid either but is drinking some.    She had diarrhea Wednesday as well- 5 or 6 episodes.  No blood.  It was watery. This has now resolved.    Last vomited earlier today.  She is vomiting mucus.   She does have a cough, and still has a ST.   While at work today she noted the onset of lower back pain.  The pain is radiating down her right leg.  It hurts to straighten all the way u[ and seems to pull on her leg.  She is not having any numbness or weakness, no bowel or bladder incontinence.   This back pain reminds her of her typical UTI symptoms.   She is not currently taking either zofran or phenergan, but she notes that zofran gives her a HA so she prefers phenergan.    She has just now noted a little bit of abdominal tenderness across both sides of her lower abdomen.  She had not noted this until she laid down in clinic LMP was about 2 weeks ago.   No vaginal symptoms or bleeding.    She is not sure if she has run a fever during this illness.    She does have a ride home in case we give her some phenergan.  She has not taken her wellbutin in a couple of months  History of BTL  Patient Active Problem List  Diagnosis  . Constipation  . Migraine  . Depression    No past medical history on file.  No  past surgical history on file.  History  Substance Use Topics  . Smoking status: Never Smoker   . Smokeless tobacco: Not on file  . Alcohol Use: No    No family history on file.  No Known Allergies  Medication list has been reviewed and updated.  Current Outpatient Prescriptions on File Prior to Visit  Medication Sig Dispense Refill  . atenolol (TENORMIN) 25 MG tablet TAKE 1 TABLET BY MOUTH ONCE A DAY  30 tablet  0  . buPROPion (WELLBUTRIN SR) 150 MG 12 hr tablet Take 150 mg by mouth 2 (two) times daily.      . ondansetron (ZOFRAN-ODT) 4 MG disintegrating tablet One or 2 tabs every 6 hours as needed for nausea.  12 tablet  0  . promethazine (PHENERGAN) 25 MG tablet Take 1 tablet (25 mg total) by mouth every 8 (eight) hours as needed for nausea.  20 tablet  0    Review of Systems:  As per HPI- otherwise negative.   Physical Examination: Filed Vitals:   02/25/12 1812  BP: 130/80  Pulse: 111  Temp: 98.6 F (37 C)  Resp: 16   Filed  Vitals:   02/25/12 1812  Height: 5\' 1"  (1.549 m)  Weight: 162 lb 3.2 oz (73.573 kg)   Body mass index is 30.65 kg/(m^2). Ideal Body Weight: Weight in (lb) to have BMI = 25: 132   GEN: WDWN, NAD, Non-toxic, A & O x 3 HEENT: Atraumatic, Normocephalic. Neck supple. No masses, No LAD. Bilateral TM wnl, oropharynx normal.  PEERL,EOMI.   Ears and Nose: No external deformity. CV: RRR, No M/G/R. No JVD. No thrill. No extra heart sounds. PULM: CTA B, no wheezes, crackles, rhonchi. No retractions. No resp. distress. No accessory muscle use. ABD: S, ND, +BS. No rebound. No HSM. She has minimal tenderness over her lower abdomen, most over her bladder. Negative SLT.  Normal strength and sensation both LE.  She is tender in her right lumbar muscles.   EXTR: No c/c/e NEURO Normal gait.  PSYCH: Normally interactive. Conversant. Not depressed or anxious appearing.  Calm demeanor.   Results for orders placed in visit on 02/25/12  POCT CBC      Component  Value Range   WBC 6.8  4.6 - 10.2 K/uL   Lymph, poc 1.3  0.6 - 3.4   POC LYMPH PERCENT 18.7  10 - 50 %L   MID (cbc) 0.5  0 - 0.9   POC MID % 6.8  0 - 12 %M   POC Granulocyte 5.1  2 - 6.9   Granulocyte percent 74.5  37 - 80 %G   RBC 4.83  4.04 - 5.48 M/uL   Hemoglobin 15.3  12.2 - 16.2 g/dL   HCT, POC 16.1 (*) 09.6 - 47.9 %   MCV 101.5 (*) 80 - 97 fL   MCH, POC 31.7 (*) 27 - 31.2 pg   MCHC 31.2 (*) 31.8 - 35.4 g/dL   RDW, POC 04.5     Platelet Count, POC 282  142 - 424 K/uL   MPV 10.0  0 - 99.8 fL  POCT URINALYSIS DIPSTICK      Component Value Range   Color, UA yellow     Clarity, UA cloudy     Glucose, UA neg     Bilirubin, UA small     Ketones, UA 40     Spec Grav, UA 1.025     Blood, UA trace-intact     pH, UA 7.0     Protein, UA 30     Urobilinogen, UA 1.0     Nitrite, UA neg     Leukocytes, UA small (1+)    POCT UA - MICROSCOPIC ONLY      Component Value Range   WBC, Ur, HPF, POC 4-8     RBC, urine, microscopic 0-2     Bacteria, U Microscopic 3+     Mucus, UA positive     Epithelial cells, urine per micros 6-12     Crystals, Ur, HPF, POC neg     Casts, Ur, LPF, POC neg     Yeast, UA neg    POCT URINE PREGNANCY      Component Value Range   Preg Test, Ur Negative    POCT RAPID STREP A (OFFICE)      Component Value Range   Rapid Strep A Screen Negative  Negative    Assessment and Plan: 1. Low back pain  POCT urinalysis dipstick, POCT UA - Microscopic Only, POCT urine pregnancy, cyclobenzaprine (FLEXERIL) 10 MG tablet  2. Sore throat  POCT rapid strep A  3. Dehydration    4. Nausea  and vomiting  POCT CBC, promethazine (PHENERGAN) injection 12.5 mg, promethazine (PHENERGAN) 25 MG tablet  5. UTI (lower urinary tract infection)  cefTRIAXone (ROCEPHIN) injection 1 g, Urine culture, nitrofurantoin, macrocrystal-monohydrate, (MACROBID) 100 MG capsule   [name] is ill today with gastroenteritis and a UTI.  Back pain may be due to her UTI vs muscle strain from vomiting.  Treated with phenergan and rocephin IM and 2 L of IVF.  She felt less nauseated and is able to go home.  She will be observed by her family. Offered to order a CT tonight as she did start to notice some lower abdominal pain.  However, [name] feels comfortable with close follow- up if not better.  I do not think that she has appendicitis as her WBC count is normal and a UTI is more likely.  May use flexeril as needed for her back pain but cautioned her about excessive sedation if used with phenergan.  [name] will be sure to let us know if she is not better in the next 24 hours.  Her mother is a [name] and they know how to reach me if any emergency.    Will need to recheck CBC when well- discussed this with [name]  Meds ordered this encounter  Medications  . promethazine (PHENERGAN) injection 12.5 mg    Sig:   . cefTRIAXone (ROCEPHIN) injection 1 g    Sig:   . promethazine (PHENERGAN) 25 MG tablet    Sig: Take 1 tablet (25 mg total) by mouth every 8 (eight) hours as needed for nausea.    Dispense:  20 tablet    Refill:  0  . nitrofurantoin, macrocrystal-monohydrate, (MACROBID) 100 MG capsule    Sig: Take 1 capsule (100 mg total) by mouth 2 (two) times daily.    Dispense:  14 capsule    Refill:  0  . cyclobenzaprine (FLEXERIL) 10 MG tablet    Sig: Take 1 tablet (10 mg total) by mouth 2 (two) times daily as needed for muscle spasms.    Dispense:  20 tablet    Refill:  0     Ayaana Biondo, MD

## 2012-02-26 ENCOUNTER — Telehealth: Payer: Self-pay | Admitting: Family Medicine

## 2012-02-26 NOTE — Telephone Encounter (Signed)
Called- could not place call at this time

## 2012-02-27 LAB — URINE CULTURE: Colony Count: 55000

## 2012-02-28 ENCOUNTER — Telehealth: Payer: Self-pay

## 2012-02-28 MED ORDER — HYDROCOD POLST-CHLORPHEN POLST 10-8 MG/5ML PO LQCR: 5.0000 mL | Freq: Two times a day (BID) | ORAL | Status: DC

## 2012-02-28 NOTE — Telephone Encounter (Signed)
I can call her in some cough medications - it may make her sleepy. At TL desk.

## 2012-02-28 NOTE — Telephone Encounter (Signed)
Pt is stating that the cough is no better and she would like to know if she needs to come  Back in or can we call something else in for her   Best number [phone]

## 2012-02-28 NOTE — Telephone Encounter (Signed)
Patient was here for multiple issues, I think she needs to return to clinic, do you agree? However, she was c/o cough when she was seen, but this was not the most pressing issue. Please advise.

## 2012-02-29 NOTE — Telephone Encounter (Signed)
rx faxed in.  Tried calling pt no answer/no vm

## 2012-02-29 NOTE — Telephone Encounter (Signed)
Mom notified that rx was sent in

## 2012-04-28 ENCOUNTER — Ambulatory Visit (INDEPENDENT_AMBULATORY_CARE_PROVIDER_SITE_OTHER): Payer: Managed Care, Other (non HMO) | Admitting: Physician Assistant

## 2012-04-28 VITALS — BP 154/99 | HR 93 | Temp 98.7°F | Resp 16 | Ht 61.0 in | Wt 164.6 lb

## 2012-04-28 DIAGNOSIS — G43109 Migraine with aura, not intractable, without status migrainosus: Secondary | ICD-10-CM

## 2012-04-28 MED ORDER — KETOROLAC TROMETHAMINE 60 MG/2ML IM SOLN
60.0000 mg | Freq: Once | INTRAMUSCULAR | Status: AC
  Administered 2012-04-28: 60 mg via INTRAMUSCULAR

## 2012-04-28 MED ORDER — ATENOLOL 25 MG PO TABS: 25.0000 mg | ORAL_TABLET | Freq: Every day | ORAL | Status: DC

## 2012-04-28 MED ORDER — PROMETHAZINE HCL 25 MG/ML IJ SOLN
25.0000 mg | Freq: Four times a day (QID) | INTRAMUSCULAR | Status: DC | PRN
  Administered 2012-04-28: 25 mg via INTRAMUSCULAR

## 2012-04-28 NOTE — Patient Instructions (Signed)
Plan to return, at your convenience, to discuss how we can prevent your migraines.

## 2012-04-28 NOTE — Progress Notes (Signed)
Subjective:    Patient ID: [name], female    DOB: October 11, 1965, 47 y.o.   MRN: [id number]  HPI This 47 y.o. female presents for evaluation of migraine HA.    This HA began 2 days ago, and is her typical migraine.  It began with visual aura, then posterior head pain.  She has photo and phonophobia, and nausea.  Vomiting yesterday.  No dizziness, blurred/double vision, paresthesias or weakness.  She's had improvement since starting atenolol for migraine prevention, but hasn't taken it in several weeks (she just hasn't picked it up from the pharmacy).  Her HA seem to be occurring with her menses.   Past Medical History  Diagnosis Date  . Migraine with visual aura   . UTI (lower urinary tract infection)     Past Surgical History  Procedure Date  . Tubal ligation   . Exploratory laparotomy   . Bladder suspension     Prior to Admission medications   Medication Sig Start Date End Date Taking? Authorizing Provider  esomeprazole (NEXIUM) 40 MG capsule Take 40 mg by mouth daily before breakfast.   Yes Historical Provider, MD  atenolol (TENORMIN) 25 MG tablet Take 1 tablet (25 mg total) by mouth daily. 04/28/12   Jaslynn Thome S Myquan Schaumburg, PA-C  promethazine (PHENERGAN) 25 MG tablet Take 1 tablet (25 mg total) by mouth every 8 (eight) hours as needed for nausea. 02/25/12 03/03/12  Pearline Cables, MD    Allergies  Allergen Reactions  . Ondansetron Other (See Comments)    Helps her nausea, but makes HA worse    History   Social History  . Marital Status: Married    Spouse Name: [name]    Number of Children: 3  . Years of Education: 12   Occupational History  . [employer]    Social History Main Topics  . Smoking status: Never Smoker   . Smokeless tobacco: Never Used  . Alcohol Use: No  . Drug Use: No  . Sexually Active: Yes -- Female partner(s)    Birth Control/ Protection: Surgical   Other Topics Concern  . Not on file   Social History Narrative   Lives  with her husband and their three children, and her daughter's boyfriend.  Her older son's daughter lives there part-time as well.    Family History  Problem Relation Age of Onset  . Diabetes Son 12    type 1     Review of Systems As above.    Objective:   Physical Exam Blood pressure 154/99, pulse 93, temperature 98.7 F (37.1 C), temperature source Oral, resp. rate 16, height 5\' 1"  (1.549 m), weight 164 lb 9.6 oz (74.662 kg), last menstrual period 04/25/2012. Body mass index is 31.10 kg/(m^2). Well-developed, well nourished WF who is awake, alert and oriented, in NAD, though she's obviously uncomfortable with the lights on. HEENT: [place]/AT, PERRL, EOMI.  No nystagmus. Sclera and conjunctiva are clear.  Funduscopic exam is normal. EAC are patent, TMs are normal in appearance. Nasal mucosa is pink and moist. OP is clear. Neck: supple, non-tender, no lymphadenopathy, thyromegaly. Heart: RRR, no murmur Lungs: normal effort, CTA Extremities: no cyanosis, clubbing or edema. Skin: warm and dry without rash. Neurologic: CN II-XII intact.  Normal sensation and strength.  DTR's symmetrically strong throughout.  Normal coordination. Psychologic: good mood and appropriate affect, normal speech and behavior.     Assessment & Plan:   1. Migraine with aura  ketorolac (TORADOL) injection 60 mg,  promethazine (PHENERGAN) injection 25 mg, atenolol (TENORMIN) 25 MG tablet   Patient Instructions  Plan to return, at your convenience, to discuss how we can prevent your migraines.

## 2012-08-20 ENCOUNTER — Emergency Department (HOSPITAL_COMMUNITY)
Admission: EM | Admit: 2012-08-20 | Discharge: 2012-08-20 | Disposition: A | Discharge status: Home / Self Care | Payer: Managed Care, Other (non HMO) | Attending: Emergency Medicine | Admitting: Emergency Medicine

## 2012-08-20 ENCOUNTER — Encounter (HOSPITAL_COMMUNITY): Payer: Self-pay | Admitting: Cardiology

## 2012-08-20 DIAGNOSIS — G43909 Migraine, unspecified, not intractable, without status migrainosus: Secondary | ICD-10-CM | POA: Insufficient documentation

## 2012-08-20 DIAGNOSIS — Z87440 Personal history of urinary (tract) infections: Secondary | ICD-10-CM | POA: Insufficient documentation

## 2012-08-20 DIAGNOSIS — R51 Headache: Secondary | ICD-10-CM

## 2012-08-20 DIAGNOSIS — Z79899 Other long term (current) drug therapy: Secondary | ICD-10-CM | POA: Insufficient documentation

## 2012-08-20 MED ORDER — KETOROLAC TROMETHAMINE 30 MG/ML IJ SOLN
INTRAMUSCULAR | Status: AC
  Administered 2012-08-20: 15 mg
  Filled 2012-08-20: qty 1

## 2012-08-20 MED ORDER — SODIUM CHLORIDE 0.9 % IV BOLUS (SEPSIS)
1000.0000 mL | Freq: Once | INTRAVENOUS | Status: AC
  Administered 2012-08-20: 1000 mL via INTRAVENOUS

## 2012-08-20 MED ORDER — KETOROLAC TROMETHAMINE 15 MG/ML IJ SOLN
15.0000 mg | Freq: Once | INTRAMUSCULAR | Status: AC
  Administered 2012-08-20: 15 mg via INTRAVENOUS
  Filled 2012-08-20: qty 1

## 2012-08-20 MED ORDER — METOCLOPRAMIDE HCL 5 MG/ML IJ SOLN
10.0000 mg | Freq: Once | INTRAMUSCULAR | Status: AC
  Administered 2012-08-20: 10 mg via INTRAVENOUS
  Filled 2012-08-20: qty 2

## 2012-08-20 MED ORDER — DIPHENHYDRAMINE HCL 50 MG/ML IJ SOLN
25.0000 mg | Freq: Once | INTRAMUSCULAR | Status: AC
  Administered 2012-08-20: 25 mg via INTRAVENOUS
  Filled 2012-08-20: qty 1

## 2012-08-20 NOTE — ED Notes (Signed)
Up to b/r independently, steady gait, mother at side.

## 2012-08-20 NOTE — ED Notes (Signed)
Pt reports generalized headache that started this morning. States she took OTC medication without much relief and now having nausea. No vomiting. Denies any visions changes. Reports a sensitivity to light. No neuro deficits.

## 2012-08-20 NOTE — ED Notes (Signed)
Up to b/r independently, steady gait.

## 2012-08-20 NOTE — ED Notes (Signed)
Alert, NAD, calm, denies pain or nausea

## 2012-08-20 NOTE — ED Notes (Signed)
Mother at [facility], no changes, updated on wait.

## 2012-08-20 NOTE — ED Notes (Addendum)
C/o HA, also nausea and light sensitivity, pinopoints to occipital area,  c/w usual migraine, no relief with OTC excedrin, onset this am, "mother here" with pt (not in room at this time). Alert, NAD, calm, interactive, resps e/u, speaking in clear complete sentences.

## 2012-08-24 NOTE — ED Provider Notes (Signed)
History    46yf with ha. Gradual onset this morning. Denies trauma. Diffuse. Constant. No appreciable exacerbating or relieving factors. No fever or chills. Nausea. No vomiting. No photophobia. No numbness, tingling or loss of strength. No blood thinners.   CSN: [id number]  Arrival date & time 08/20/12  [id number]   First MD Initiated Contact with Patient 08/20/12 1910      Chief Complaint  Patient presents with  . Headache    (Consider location/radiation/quality/duration/timing/severity/associated sxs/prior treatment) HPI  Past Medical History  Diagnosis Date  . Migraine with visual aura   . UTI (lower urinary tract infection)     Past Surgical History  Procedure Laterality Date  . Tubal ligation    . Exploratory laparotomy    . Bladder suspension      Family History  Problem Relation Age of Onset  . Diabetes Son 12    type 1    History  Substance Use Topics  . Smoking status: Never Smoker   . Smokeless tobacco: Never Used  . Alcohol Use: No    OB History   Grav Para Term Preterm Abortions TAB SAB Ect Mult Living                  Review of Systems  All systems reviewed and negative, other than as noted in HPI.   Allergies  Ondansetron  Home Medications   Current Outpatient Rx  Name  Route  Sig  Dispense  Refill  . aspirin-acetaminophen-caffeine (EXCEDRIN MIGRAINE) 250-250-65 MG per tablet   Oral   Take 1 tablet by mouth every 6 (six) hours as needed for pain.         Marland Kitchen atenolol (TENORMIN) 25 MG tablet   Oral   Take 25 mg by mouth daily.         Marland Kitchen esomeprazole (NEXIUM) 40 MG capsule   Oral   Take 40 mg by mouth daily before breakfast.           BP 115/62  Pulse 72  Temp(Src) 97.9 F (36.6 C) (Oral)  Resp 18  SpO2 98%  Physical Exam  Nursing note and vitals reviewed. Constitutional: She is oriented to person, place, and time. She appears well-developed and well-nourished. No distress.  HENT:  Head: Normocephalic and atraumatic.   Eyes: Conjunctivae are normal. Pupils are equal, round, and reactive to light. Right eye exhibits no discharge. Left eye exhibits no discharge.  Neck: Neck supple.  No nuchal rigidity  Cardiovascular: Normal rate, regular rhythm and normal heart sounds.  Exam reveals no gallop and no friction rub.   No murmur heard. Pulmonary/Chest: Effort normal and breath sounds normal. No respiratory distress.  Abdominal: Soft. She exhibits no distension. There is no tenderness.  Musculoskeletal: She exhibits no edema and no tenderness.  Neurological: She is alert and oriented to person, place, and time. No cranial nerve deficit. She exhibits normal muscle tone. Coordination normal.  Skin: Skin is warm and dry.  Psychiatric: She has a normal mood and affect. Her behavior is normal. Thought content normal.    ED Course  Procedures (including critical care time)  Labs Reviewed - No data to display No results found.   1. Headache       MDM  46yF with headache. Suspect primary HA. Consider emergent secondary causes such as bleed, infectious or mass but doubt. There is no history of trauma. Pt has a nonfocal neurological exam. Afebrile and neck supple. No use of blood thinning medication. Consider ocular  etiology such as acute angle closure glaucoma but doubt. Pt denies acute change in visual acuity and eye exam unremarkable. Doubt temporal arteritis given age, no temporal tenderness and temporal artery pulsations palpable. Doubt CO poisoning. No contacts with similar symptoms. Doubt venous thrombosis. Doubt carotid or vertebral arteries dissection. Symptoms improved with meds. Feel that can be safely discharged, but strict return precautions discussed. Outpt fu.         Virgel Manifold, MD 08/24/12 [phone]

## 2012-11-27 ENCOUNTER — Emergency Department (HOSPITAL_COMMUNITY)
Admission: EM | Admit: 2012-11-27 | Discharge: 2012-11-27 | Disposition: A | Discharge status: Home / Self Care | Payer: Managed Care, Other (non HMO) | Attending: Emergency Medicine | Admitting: Emergency Medicine

## 2012-11-27 ENCOUNTER — Encounter (HOSPITAL_COMMUNITY): Payer: Self-pay | Admitting: Nurse Practitioner

## 2012-11-27 DIAGNOSIS — N3090 Cystitis, unspecified without hematuria: Secondary | ICD-10-CM | POA: Diagnosis present

## 2012-11-27 DIAGNOSIS — G43109 Migraine with aura, not intractable, without status migrainosus: Secondary | ICD-10-CM | POA: Insufficient documentation

## 2012-11-27 DIAGNOSIS — Z9851 Tubal ligation status: Secondary | ICD-10-CM | POA: Insufficient documentation

## 2012-11-27 DIAGNOSIS — R300 Dysuria: Secondary | ICD-10-CM | POA: Insufficient documentation

## 2012-11-27 DIAGNOSIS — A499 Bacterial infection, unspecified: Secondary | ICD-10-CM | POA: Insufficient documentation

## 2012-11-27 DIAGNOSIS — Z79899 Other long term (current) drug therapy: Secondary | ICD-10-CM | POA: Insufficient documentation

## 2012-11-27 DIAGNOSIS — R350 Frequency of micturition: Secondary | ICD-10-CM | POA: Insufficient documentation

## 2012-11-27 DIAGNOSIS — N760 Acute vaginitis: Secondary | ICD-10-CM | POA: Insufficient documentation

## 2012-11-27 DIAGNOSIS — Z3202 Encounter for pregnancy test, result negative: Secondary | ICD-10-CM | POA: Insufficient documentation

## 2012-11-27 DIAGNOSIS — R3915 Urgency of urination: Secondary | ICD-10-CM | POA: Insufficient documentation

## 2012-11-27 DIAGNOSIS — N390 Urinary tract infection, site not specified: Secondary | ICD-10-CM

## 2012-11-27 DIAGNOSIS — B9689 Other specified bacterial agents as the cause of diseases classified elsewhere: Secondary | ICD-10-CM

## 2012-11-27 DIAGNOSIS — N898 Other specified noninflammatory disorders of vagina: Secondary | ICD-10-CM | POA: Insufficient documentation

## 2012-11-27 HISTORY — DX: Other specified bacterial agents as the cause of diseases classified elsewhere: B96.89

## 2012-11-27 LAB — URINALYSIS, ROUTINE W REFLEX MICROSCOPIC
Bilirubin Urine: NEGATIVE
Glucose, UA: NEGATIVE mg/dL
Ketones, ur: NEGATIVE mg/dL
Nitrite: NEGATIVE
Protein, ur: 100 mg/dL — AB
Specific Gravity, Urine: 1.017 (ref 1.005–1.030)
Urobilinogen, UA: 1 mg/dL (ref 0.0–1.0)
pH: 6.5 (ref 5.0–8.0)

## 2012-11-27 LAB — CBC WITH DIFFERENTIAL/PLATELET
Basophils Absolute: 0 10*3/uL (ref 0.0–0.1)
Basophils Relative: 0 % (ref 0–1)
Eosinophils Absolute: 0.1 10*3/uL (ref 0.0–0.7)
Eosinophils Relative: 1 % (ref 0–5)
HCT: 39.3 % (ref 36.0–46.0)
Hemoglobin: 13.8 g/dL (ref 12.0–15.0)
Lymphocytes Relative: 10 % — ABNORMAL LOW (ref 12–46)
Lymphs Abs: 0.9 10*3/uL (ref 0.7–4.0)
MCH: 32.6 pg (ref 26.0–34.0)
MCHC: 35.1 g/dL (ref 30.0–36.0)
MCV: 92.9 fL (ref 78.0–100.0)
Monocytes Absolute: 0.9 10*3/uL (ref 0.1–1.0)
Monocytes Relative: 10 % (ref 3–12)
Neutro Abs: 7.5 10*3/uL (ref 1.7–7.7)
Neutrophils Relative %: 80 % — ABNORMAL HIGH (ref 43–77)
Platelets: 239 10*3/uL (ref 150–400)
RBC: 4.23 MIL/uL (ref 3.87–5.11)
RDW: 13.3 % (ref 11.5–15.5)
WBC: 9.5 10*3/uL (ref 4.0–10.5)

## 2012-11-27 LAB — COMPREHENSIVE METABOLIC PANEL
ALT: 13 U/L (ref 0–35)
AST: 17 U/L (ref 0–37)
Albumin: 3.4 g/dL — ABNORMAL LOW (ref 3.5–5.2)
Alkaline Phosphatase: 83 U/L (ref 39–117)
BUN: 10 mg/dL (ref 6–23)
CO2: 27 mEq/L (ref 19–32)
Calcium: 9 mg/dL (ref 8.4–10.5)
Chloride: 102 mEq/L (ref 96–112)
Creatinine, Ser: 0.71 mg/dL (ref 0.50–1.10)
GFR calc Af Amer: >90 mL/min (ref >90)
GFR calc non Af Amer: >90 mL/min (ref >90)
Glucose, Bld: 110 mg/dL — ABNORMAL HIGH (ref 70–99)
Potassium: 3.5 mEq/L (ref 3.5–5.1)
Sodium: 137 mEq/L (ref 135–145)
Total Bilirubin: 0.3 mg/dL (ref 0.3–1.2)
Total Protein: 6.7 g/dL (ref 6.0–8.3)

## 2012-11-27 LAB — URINE MICROSCOPIC-ADD ON

## 2012-11-27 LAB — POCT PREGNANCY, URINE: Preg Test, Ur: NEGATIVE

## 2012-11-27 LAB — WET PREP, GENITAL
Trich, Wet Prep: NONE SEEN
Yeast Wet Prep HPF POC: NONE SEEN

## 2012-11-27 LAB — LIPASE, BLOOD: Lipase: 31 U/L (ref 11–59)

## 2012-11-27 MED ORDER — METRONIDAZOLE 500 MG PO TABS: 500.0000 mg | ORAL_TABLET | Freq: Two times a day (BID) | ORAL | Status: DC

## 2012-11-27 MED ORDER — DEXTROSE 5 % IV SOLN
1.0000 g | Freq: Once | INTRAVENOUS | Status: AC
  Administered 2012-11-27: 1 g via INTRAVENOUS
  Filled 2012-11-27: qty 10

## 2012-11-27 MED ORDER — CEPHALEXIN 500 MG PO CAPS: 1000.0000 mg | ORAL_CAPSULE | Freq: Two times a day (BID) | ORAL | Status: DC

## 2012-11-27 MED ORDER — ACETAMINOPHEN 325 MG PO TABS
650.0000 mg | ORAL_TABLET | Freq: Once | ORAL | Status: AC
  Administered 2012-11-27: 650 mg via ORAL
  Filled 2012-11-27: qty 2

## 2012-11-27 NOTE — ED Notes (Addendum)
Pt c/o lower abd pain for past week, reports urinary urgency and hesitancy also. Pt has hx UTI and bladder sling surgery. Pt reports nausea but states she always has nausea

## 2012-11-27 NOTE — ED Provider Notes (Signed)
CSN: [id number]     Arrival date & time 11/27/12  1811 History     First MD Initiated Contact with Patient 11/27/12 1937     Chief Complaint  Patient presents with  . Abdominal Pain   (Consider location/radiation/quality/duration/timing/severity/associated sxs/prior Treatment) Patient is a 47 y.o. female presenting with abdominal pain. The history is provided by the patient.  Abdominal Pain Pain location:  Suprapubic Pain quality: burning   Pain radiates to:  Does not radiate Pain severity:  Mild Onset quality:  Gradual Duration:  1 week Timing:  Constant Progression:  Unchanged Chronicity:  Recurrent Relieved by:  Nothing Worsened by:  Nothing tried Ineffective treatments:  None tried Associated symptoms: dysuria and vaginal discharge   Associated symptoms: no chest pain, no cough, no diarrhea, no fatigue, no fever, no hematuria, no nausea, no shortness of breath and no vomiting     Past Medical History  Diagnosis Date  . Migraine with visual aura   . UTI (lower urinary tract infection)    Past Surgical History  Procedure Laterality Date  . Tubal ligation    . Exploratory laparotomy    . Bladder suspension     Family History  Problem Relation Age of Onset  . Diabetes Son 12    type 1   History  Substance Use Topics  . Smoking status: Never Smoker   . Smokeless tobacco: Never Used  . Alcohol Use: No   OB History   Grav Para Term Preterm Abortions TAB SAB Ect Mult Living                 Review of Systems  Constitutional: Negative for fever and fatigue.  HENT: Negative for congestion, drooling and neck pain.   Eyes: Negative for pain.  Respiratory: Negative for cough and shortness of breath.   Cardiovascular: Negative for chest pain.  Gastrointestinal: Positive for abdominal pain. Negative for nausea, vomiting and diarrhea.  Genitourinary: Positive for dysuria, urgency, frequency and vaginal discharge. Negative for hematuria.  Musculoskeletal: Negative  for back pain and gait problem.  Skin: Negative for color change.  Neurological: Negative for dizziness and headaches.  Hematological: Negative for adenopathy.  Psychiatric/Behavioral: Negative for behavioral problems.  All other systems reviewed and are negative.    Allergies  Ondansetron  Home Medications   Current Outpatient Rx  Name  Route  Sig  Dispense  Refill  . aspirin-acetaminophen-caffeine (EXCEDRIN MIGRAINE) 250-250-65 MG per tablet   Oral   Take 1 tablet by mouth every 6 (six) hours as needed for pain.         Marland Kitchen atenolol (TENORMIN) 25 MG tablet   Oral   Take 25 mg by mouth daily.         Marland Kitchen esomeprazole (NEXIUM) 40 MG capsule   Oral   Take 40 mg by mouth daily before breakfast.          BP 140/95  Pulse 102  Temp(Src) 98.4 F (36.9 C) (Oral)  Resp 17  Ht 5' (1.524 m)  Wt 174 lb (78.926 kg)  BMI 33.98 kg/m2  SpO2 96% Physical Exam  Nursing note and vitals reviewed. Constitutional: She is oriented to person, place, and time. She appears well-developed and well-nourished.  HENT:  Head: Normocephalic.  Mouth/Throat: No oropharyngeal exudate.  Eyes: Conjunctivae and EOM are normal. Pupils are equal, round, and reactive to light.  Neck: Normal range of motion. Neck supple.  Cardiovascular: Normal rate, regular rhythm, normal heart sounds and intact distal pulses.  Exam reveals no gallop and no friction rub.   No murmur heard. Pulmonary/Chest: Effort normal and breath sounds normal. No respiratory distress. She has no wheezes.  Abdominal: Soft. Bowel sounds are normal. There is tenderness (mild suprapubic ttp). There is no rebound and no guarding.  Genitourinary:  Normal appearing external vagina. Normal appearing cervix. Os closed. No CMT. Mild ttp of uterus during bimanual.   Musculoskeletal: Normal range of motion. She exhibits no edema and no tenderness.  Neurological: She is alert and oriented to person, place, and time.  Skin: Skin is warm and  dry.  Psychiatric: She has a normal mood and affect. Her behavior is normal.    ED Course   Procedures (including critical care time)  Labs Reviewed  URINALYSIS, ROUTINE W REFLEX MICROSCOPIC - Abnormal; Notable for the following:    APPearance TURBID (*)    Hgb urine dipstick MODERATE (*)    Protein, ur 100 (*)    Leukocytes, UA LARGE (*)    All other components within normal limits  COMPREHENSIVE METABOLIC PANEL - Abnormal; Notable for the following:    Glucose, Bld 110 (*)    Albumin 3.4 (*)    All other components within normal limits  CBC WITH DIFFERENTIAL - Abnormal; Notable for the following:    Neutrophils Relative % 80 (*)    Lymphocytes Relative 10 (*)    All other components within normal limits  URINE MICROSCOPIC-ADD ON - Abnormal; Notable for the following:    Bacteria, UA MANY (*)    All other components within normal limits  URINE CULTURE  GC/CHLAMYDIA PROBE AMP  WET PREP, GENITAL  LIPASE, BLOOD  POCT PREGNANCY, URINE   No results found. 1. UTI (lower urinary tract infection)   2. BV (bacterial vaginosis)     MDM  8:04 PM 47 y.o. female w hx of recurrent uti's pw dysuria x 1 week. Pt also notes vag d/c. Pt denies N/V/D, AFVSS here, no evidence of pyelonephritis. Will perform pelvic, labs already sent, pt refuses pain medicine.    Pt found to have UTI, few clue cells. Gave rocephin here while awaiting labwork.  I have discussed the diagnosis/risks/treatment options with the patient and believe the pt to be eligible for discharge home to follow-up with pcp as needed. We also discussed returning to the ED immediately if new or worsening sx occur. We discussed the sx which are most concerning (e.g., back pain, fever, inability to take abx, worsening pain) that necessitate immediate return. Any new prescriptions provided to the patient are listed below.  Discharge Medication List as of 11/27/2012 10:51 PM    START taking these medications   Details  cephALEXin  (KEFLEX) 500 MG capsule Take 2 capsules (1,000 mg total) by mouth 2 (two) times daily., Starting 11/27/2012, Until Discontinued, Print    metroNIDAZOLE (FLAGYL) 500 MG tablet Take 1 tablet (500 mg total) by mouth 2 (two) times daily. One po bid x 7 days, Starting 11/27/2012, Until Discontinued, Print         Junius Argyle, MD 11/28/12 [phone]

## 2012-11-28 LAB — GC/CHLAMYDIA PROBE AMP
CT Probe RNA: NEGATIVE
GC Probe RNA: NEGATIVE

## 2012-11-29 LAB — URINE CULTURE: Colony Count: >=100000

## 2012-11-30 NOTE — ED Notes (Signed)
+   Urine Culture- treated with appropriate medication per protocol MD. 

## 2012-12-25 ENCOUNTER — Ambulatory Visit (INDEPENDENT_AMBULATORY_CARE_PROVIDER_SITE_OTHER): Payer: Managed Care, Other (non HMO) | Admitting: *Deleted

## 2012-12-25 DIAGNOSIS — Z23 Encounter for immunization: Secondary | ICD-10-CM

## 2013-01-13 ENCOUNTER — Ambulatory Visit: Payer: BC Managed Care – PPO | Admitting: Family Medicine

## 2013-01-13 VITALS — BP 136/90 | HR 71 | Temp 97.9°F | Resp 16 | Ht <= 58 in | Wt 162.4 lb

## 2013-01-13 DIAGNOSIS — R42 Dizziness and giddiness: Secondary | ICD-10-CM

## 2013-01-13 DIAGNOSIS — R030 Elevated blood-pressure reading, without diagnosis of hypertension: Secondary | ICD-10-CM

## 2013-01-13 DIAGNOSIS — N390 Urinary tract infection, site not specified: Secondary | ICD-10-CM

## 2013-01-13 DIAGNOSIS — IMO0001 Reserved for inherently not codable concepts without codable children: Secondary | ICD-10-CM

## 2013-01-13 DIAGNOSIS — D7589 Other specified diseases of blood and blood-forming organs: Secondary | ICD-10-CM

## 2013-01-13 DIAGNOSIS — G43109 Migraine with aura, not intractable, without status migrainosus: Secondary | ICD-10-CM

## 2013-01-13 LAB — POCT CBC
Granulocyte percent: 65.9 %G (ref 37–80)
HCT, POC: 44.4 % (ref 37.7–47.9)
Hemoglobin: 13.9 g/dL (ref 12.2–16.2)
Lymph, poc: 1 (ref 0.6–3.4)
MCH, POC: 31.3 pg — AB (ref 27–31.2)
MCHC: 31.3 g/dL — AB (ref 31.8–35.4)
MCV: 99.9 fL — AB (ref 80–97)
MID (cbc): 0.3 (ref 0–0.9)
MPV: 9.2 fL (ref 0–99.8)
POC Granulocyte: 2.6 (ref 2–6.9)
POC LYMPH PERCENT: 26.1 %L (ref 10–50)
POC MID %: 8 %M (ref 0–12)
Platelet Count, POC: 249 10*3/uL (ref 142–424)
RBC: 4.44 M/uL (ref 4.04–5.48)
RDW, POC: 14.6 %
WBC: 4 10*3/uL — AB (ref 4.6–10.2)

## 2013-01-13 LAB — POCT URINALYSIS DIPSTICK
Bilirubin, UA: NEGATIVE
Glucose, UA: NEGATIVE
Ketones, UA: NEGATIVE
Leukocytes, UA: NEGATIVE
Nitrite, UA: NEGATIVE
Protein, UA: NEGATIVE
Spec Grav, UA: 1.015
Urobilinogen, UA: 0.2
pH, UA: 7

## 2013-01-13 LAB — GLUCOSE, POCT (MANUAL RESULT ENTRY): POC Glucose: 62 mg/dl — AB (ref 70–99)

## 2013-01-13 LAB — POCT UA - MICROSCOPIC ONLY
Casts, Ur, LPF, POC: NEGATIVE
Crystals, Ur, HPF, POC: NEGATIVE
Mucus, UA: POSITIVE
Yeast, UA: NEGATIVE

## 2013-01-13 MED ORDER — VERAPAMIL HCL ER 120 MG PO TBCR: 120.0000 mg | EXTENDED_RELEASE_TABLET | Freq: Every day | ORAL | Status: DC

## 2013-01-13 NOTE — Progress Notes (Addendum)
Subjective:    Patient ID: [name], female    DOB: Sep 27, 1965, 47 y.o.   MRN: [id number]  HPI  Chief Complaint  Patient presents with   Hypertension    Check, high x 3 days   Urinary Tract Infection    X 3 weeks     HPI Comments: [name] is a 47 y.o. female who presents to the [facility] because she has been having episodes of lightheadedness and fatigue.  First noticed 4d ago when she had sudden onset of emesis followed by a nose bleed - also had some weird sensitivity in her right neck at the time.  Checked BP at her moms - was 161/83.  The following day, we still feeling lightheaded, weak, dizzy, nauseated - just off. BP again was 160/96.   Today at work was 140/96 with wrist cuff.  Currently, pt feels a "headache coming on".  Feels that the area beneath her eyes has just been more sensitive. None of this is her normal migraine aura which usu consists of floaters. No HA. On atenolol for migraine prevention.  Pt states Typically blood pressure is not elevated unless she is having severe headache.   Pt denies cough, congestion, chest pain, swelling in legs. Pt frequently has urine infections. Saw Urinologist in the past who placed a bladder sling.  She still states she "keeps them" - gets them again as soon as the antibiotic is completed. Think that she has had preventative abx discussed w/ her prev but unsure. Vagninal odor, urinary frequency, burning when urinating which occurred a week ago and seem to be actually decreasing a little now.  Pt has celiac disease but has not been following a gluten free diet.  Current Outpatient Prescriptions on File Prior to Visit  Medication Sig Dispense Refill   aspirin-acetaminophen-caffeine (EXCEDRIN MIGRAINE) 250-250-65 MG per tablet Take 2 tablets by mouth every 6 (six) hours as needed for pain.        Aspirin-Acetaminophen-Caffeine (GOODY HEADACHE PO) Take 1 Package by mouth daily as needed (for pain).       atenolol (TENORMIN)  25 MG tablet Take 25 mg by mouth daily.       esomeprazole (NEXIUM) 40 MG capsule Take 40 mg by mouth daily before breakfast.       brompheniramine-pseudoephedrine (DIMETAPP) 1-15 MG/5ML ELIX Take 30 mL by mouth 2 (two) times daily as needed (for cold).       cephALEXin (KEFLEX) 500 MG capsule Take 2 capsules (1,000 mg total) by mouth 2 (two) times daily.  40 capsule  0   diphenhydrAMINE (BENADRYL) 25 MG tablet Take 25 mg by mouth every 6 (six) hours as needed for itching.       metroNIDAZOLE (FLAGYL) 500 MG tablet Take 1 tablet (500 mg total) by mouth 2 (two) times daily. One po bid x 7 days  14 tablet  0   No current facility-administered medications on file prior to visit.   Allergies  Allergen Reactions   Gluten Meal Diarrhea and Nausea Only        Ondansetron Other (See Comments)    Helps her nausea, but makes HA worse    Past Medical History  Diagnosis Date   Migraine with visual aura    UTI (lower urinary tract infection)    GERD (gastroesophageal reflux disease)     Review of Systems  Constitutional: Positive for diaphoresis, activity change and fatigue. Negative for fever and chills.  HENT: Positive for nosebleeds and neck  pain (mild). Negative for congestion, facial swelling, rhinorrhea and sinus pressure.   Cardiovascular: Negative for chest pain and leg swelling.  Gastrointestinal: Positive for nausea and vomiting. Negative for abdominal pain and diarrhea.  Genitourinary: Positive for urgency and frequency. Negative for dysuria, flank pain, decreased urine volume, vaginal discharge, difficulty urinating and pelvic pain.       Urinary odor  Skin: Negative for rash.  Neurological: Positive for dizziness, weakness, light-headedness and headaches. Negative for facial asymmetry.   BP 136/90   Pulse 71   Temp(Src) 97.9 F (36.6 C) (Oral)   Resp 16   Ht 5" (0.127 m)   Wt 162 lb 6.4 oz (73.664 kg)   BMI 4,567.18 kg/m2   SpO2 98%   LMP 12/27/2012     Objective:    Physical Exam  Constitutional: She is oriented to person, place, and time. She appears well-developed and well-nourished. No distress.  HENT:  Head: Normocephalic and atraumatic.  Right Ear: External ear normal.  Left Ear: External ear normal.  Eyes: Conjunctivae are normal. No scleral icterus.  Fundoscopic exam:      The right eye shows no arteriolar narrowing, no AV nicking and no papilledema.       The left eye shows no arteriolar narrowing, no AV nicking and no papilledema.  Neck: Normal range of motion. Neck supple. No thyromegaly present.  Cardiovascular: Normal rate, regular rhythm, normal heart sounds and intact distal pulses.   Pulmonary/Chest: Effort normal and breath sounds normal. No respiratory distress.  Musculoskeletal: She exhibits no edema.  Lymphadenopathy:    She has no cervical adenopathy.  Neurological: She is alert and oriented to person, place, and time. She has normal strength. She displays no tremor. No cranial nerve deficit or sensory deficit. She exhibits normal muscle tone. She displays a negative Romberg sign. Coordination normal.  2nd and 3rd parts of trigeminal slight decreased sensitivity on left.   Skin: Skin is warm and dry. She is not diaphoretic. No erythema.  Psychiatric: She has a normal mood and affect. Her behavior is normal.     [facility] reading (PRIMARY) by  Dr. Brigitte Pulse. EKG: NSR, no ischemic changes  Results for orders placed in visit on 01/13/13  POCT UA - MICROSCOPIC ONLY      Result Value Range   WBC, Ur, HPF, POC 1-2     RBC, urine, microscopic 10-15     Bacteria, U Microscopic 1+     Mucus, UA pos     Epithelial cells, urine per micros 2-4     Crystals, Ur, HPF, POC neg     Casts, Ur, LPF, POC neg     Yeast, UA neg    POCT URINALYSIS DIPSTICK      Result Value Range   Color, UA yellow     Clarity, UA clear     Glucose, UA neg     Bilirubin, UA neg     Ketones, UA neg     Spec Grav, UA 1.015     Blood, UA trace     pH, UA 7.0      Protein, UA neg     Urobilinogen, UA 0.2     Nitrite, UA neg     Leukocytes, UA Negative    POCT CBC      Result Value Range   WBC 4.0 (*) 4.6 - 10.2 K/uL   Lymph, poc 1.0  0.6 - 3.4   POC LYMPH PERCENT 26.1  10 - 50 %L  MID (cbc) 0.3  0 - 0.9   POC MID % 8.0  0 - 12 %M   POC Granulocyte 2.6  2 - 6.9   Granulocyte percent 65.9  37 - 80 %G   RBC 4.44  4.04 - 5.48 M/uL   Hemoglobin 13.9  12.2 - 16.2 g/dL   HCT, POC 44.4  37.7 - 47.9 %   MCV 99.9 (*) 80 - 97 fL   MCH, POC 31.3 (*) 27 - 31.2 pg   MCHC 31.3 (*) 31.8 - 35.4 g/dL   RDW, POC 14.6     Platelet Count, POC 249  142 - 424 K/uL   MPV 9.2  0 - 99.8 fL  GLUCOSE, POCT (MANUAL RESULT ENTRY)      Result Value Range   POC Glucose 62 (*) 70 - 99 mg/dl   EKG: NSR, no ischemic changes. Assessment & Plan:  UTI (urinary tract infection) - Plan: POCT UA - Microscopic Only, POCT urinalysis dipstick, POCT CBC, Comprehensive metabolic panel, TSH, Urine culture - pt with recurrent UTIs and reporting sx flair again. UA today looks benign so will hold off on trx until clx returns.   Episodic lightheadedness - Plan: EKG 12-Lead, POCT CBC, POCT glucose (manual entry), Comprehensive metabolic panel, TSH   Elevated blood pressure - Plan: EKG 12-Lead, POCT CBC, Comprehensive metabolic panel, TSH - Unsure if this is cause of pt's sxs or if pt's BP has been elevated due to her sxs but will proceed w/ starting verapamil (which may be helpful as migraine preventative as well) and rec pt RTC for recheck in 3-4d. If sxs worsen -> to ER for head CT.  Migraine with visual aura - Plan: POCT CBC, Comprehensive metabolic panel, TSH  Macrocytosis - Plan: Vitamin B12   Meds ordered this encounter  Medications   verapamil (CALAN-SR) 120 MG CR tablet    Sig: Take 1 tablet (120 mg total) by mouth at bedtime.    Dispense:  30 tablet    Refill:  0

## 2013-01-14 LAB — COMPREHENSIVE METABOLIC PANEL
ALT: 17 U/L (ref 0–35)
AST: 21 U/L (ref 0–37)
Albumin: 4 g/dL (ref 3.5–5.2)
Alkaline Phosphatase: 62 U/L (ref 39–117)
BUN: 11 mg/dL (ref 6–23)
CO2: 30 mEq/L (ref 19–32)
Calcium: 9.5 mg/dL (ref 8.4–10.5)
Chloride: 103 mEq/L (ref 96–112)
Creat: 0.61 mg/dL (ref 0.50–1.10)
Glucose, Bld: 70 mg/dL (ref 70–99)
Potassium: 3.8 mEq/L (ref 3.5–5.3)
Sodium: 138 mEq/L (ref 135–145)
Total Bilirubin: 0.5 mg/dL (ref 0.3–1.2)
Total Protein: 6.8 g/dL (ref 6.0–8.3)

## 2013-01-14 LAB — VITAMIN B12: Vitamin B-12: 450 pg/mL (ref 211–911)

## 2013-01-15 ENCOUNTER — Encounter: Payer: Self-pay | Admitting: Family Medicine

## 2013-01-15 LAB — TSH: TSH: 1.481 u[IU]/mL (ref 0.350–4.500)

## 2013-01-16 LAB — URINE CULTURE
Colony Count: NO GROWTH
Organism ID, Bacteria: NO GROWTH

## 2013-03-09 ENCOUNTER — Ambulatory Visit (INDEPENDENT_AMBULATORY_CARE_PROVIDER_SITE_OTHER): Payer: BC Managed Care – PPO | Admitting: Physician Assistant

## 2013-03-09 VITALS — BP 126/72 | HR 77 | Temp 98.6°F | Resp 17 | Ht 62.5 in | Wt 170.0 lb

## 2013-03-09 DIAGNOSIS — R059 Cough, unspecified: Secondary | ICD-10-CM

## 2013-03-09 DIAGNOSIS — J40 Bronchitis, not specified as acute or chronic: Secondary | ICD-10-CM

## 2013-03-09 DIAGNOSIS — R05 Cough: Secondary | ICD-10-CM

## 2013-03-09 LAB — POCT CBC
Granulocyte percent: 65.8 %G (ref 37–80)
HCT, POC: 43.8 % (ref 37.7–47.9)
Hemoglobin: 13.8 g/dL (ref 12.2–16.2)
Lymph, poc: 1 (ref 0.6–3.4)
MCH, POC: 31.5 pg — AB (ref 27–31.2)
MCHC: 31.5 g/dL — AB (ref 31.8–35.4)
MCV: 100.1 fL — AB (ref 80–97)
MID (cbc): 0.3 (ref 0–0.9)
MPV: 8 fL (ref 0–99.8)
POC Granulocyte: 2.4 (ref 2–6.9)
POC LYMPH PERCENT: 25.8 %L (ref 10–50)
POC MID %: 8.4 %M (ref 0–12)
Platelet Count, POC: 263 10*3/uL (ref 142–424)
RBC: 4.38 M/uL (ref 4.04–5.48)
RDW, POC: 13.9 %
WBC: 3.7 10*3/uL — AB (ref 4.6–10.2)

## 2013-03-09 MED ORDER — ALBUTEROL SULFATE HFA 108 (90 BASE) MCG/ACT IN AERS: 2.0000 | INHALATION_SPRAY | RESPIRATORY_TRACT | Status: DC | PRN

## 2013-03-09 MED ORDER — LEVOFLOXACIN 500 MG PO TABS: 500.0000 mg | ORAL_TABLET | Freq: Every day | ORAL | Status: DC

## 2013-03-09 MED ORDER — HYDROCODONE-HOMATROPINE 5-1.5 MG/5ML PO SYRP: ORAL_SOLUTION | ORAL | Status: DC

## 2013-03-09 NOTE — Progress Notes (Signed)
Patient ID: PATRENA SANTALUCIA MRN: [id number], DOB: [birth year], 47 y.o. Date of Encounter: 03/09/2013, 11:22 AM  Primary Physician: Default, Provider, MD  Chief Complaint:  Chief Complaint  Patient presents with  . Nasal Congestion  . URI  . Sore Throat    HPI: 47 y.o. female presents with a 7 day history of nasal congestion, post nasal drip, sore throat, and cough. Mild sinus pressure along the right maxillary sinus. Afebrile. No chills. Nasal congestion thick and green/yellow. Cough is mildly productive of green/yellow sputum and worse at nighttime. Some wheezing without SOB. Ears feel full, leading to sensation of muffled hearing. Has tried OTC cold preps without success. No GI complaints. Appetite normal. No sick contacts. No recent antibiotics, or recent travels.   No leg trauma, sedentary periods, h/o cancer, or tobacco use.  Past Medical History  Diagnosis Date  . Migraine with visual aura   . UTI (lower urinary tract infection)   . GERD (gastroesophageal reflux disease)      Home Meds: Prior to Admission medications   Medication Sig Start Date End Date Taking? Authorizing Provider  aspirin-acetaminophen-caffeine (EXCEDRIN MIGRAINE) [phone] MG per tablet Take 2 tablets by mouth every 6 (six) hours as needed for pain.    Yes Historical Provider, MD  Aspirin-Acetaminophen-Caffeine (GOODY HEADACHE PO) Take 1 Package by mouth daily as needed (for pain).   Yes Historical Provider, MD  atenolol (TENORMIN) 25 MG tablet Take 25 mg by mouth daily. 04/28/12  Yes Chelle S Jeffery, PA-C  esomeprazole (NEXIUM) 40 MG capsule Take 40 mg by mouth daily before breakfast.   Yes Historical Provider, MD    Allergies:  Allergies  Allergen Reactions  . Gluten Meal Diarrhea and Nausea Only       . Ondansetron Other (See Comments)    Helps her nausea, but makes HA worse    History   Social History  . Marital Status: Married    Spouse Name: [name]    Number of Children: 3  . Years of  Education: 12   Occupational History  . [employer]    Social History Main Topics  . Smoking status: Never Smoker   . Smokeless tobacco: Never Used  . Alcohol Use: No  . Drug Use: No  . Sexual Activity: Yes    Partners: Female    Birth Control/ Protection: Surgical   Other Topics Concern  . Not on file   Social History Narrative   Lives with her husband and their three children, and her daughter's boyfriend.  Her older son's daughter lives there part-time as well.     Review of Systems: Constitutional: positive for fatigue. negative for chills or fever HEENT: see above Cardiovascular: negative for chest pain or palpitations Respiratory: positive for cough and wheezing. negative for shortness of breath Abdominal: negative for abdominal pain, nausea, vomiting or diarrhea Dermatological: negative for rash Neurologic: negative for headache   Physical Exam: Blood pressure 126/72, pulse 77, temperature 98.6 F (37 C), temperature source Oral, resp. rate 17, height 5' 2.5" (1.588 m), weight 170 lb (77.111 kg), last menstrual period 03/01/2013, SpO2 97.00%., Body mass index is 30.58 kg/(m^2). General: Well developed, well nourished, in no acute distress. Head: Normocephalic, atraumatic, eyes without discharge, sclera non-icteric, nares are congested. Bilateral auditory canals clear, TM's are without perforation, pearly grey with reflective cone of light bilaterally. No sinus TTP. Oral cavity moist, dentition normal. Posterior pharynx with post nasal drip and mild erythema. No peritonsillar abscess or  tonsillar exudate. Uvula midline.  Neck: Supple. No thyromegaly. Full ROM. No lymphadenopathy. Lungs: Coarse breath sounds bilaterally that clear with coughing without wheezes, rales, or rhonchi. Breathing is unlabored.  Heart: RRR with S1 S2. No murmurs, rubs, or gallops appreciated. Msk:  Strength and tone normal for age. Extremities: No clubbing or cyanosis. No  edema. Neuro: Alert and oriented X 3. Moves all extremities spontaneously. CNII-XII grossly in tact. Psych:  Responds to questions appropriately with a normal affect.   Labs: Results for orders placed in visit on 03/09/13  POCT CBC      Result Value Range   WBC 3.7 (*) 4.6 - 10.2 K/uL   Lymph, poc 1.0  0.6 - 3.4   POC LYMPH PERCENT 25.8  10 - 50 %L   MID (cbc) 0.3  0 - 0.9   POC MID % 8.4  0 - 12 %M   POC Granulocyte 2.4  2 - 6.9   Granulocyte percent 65.8  37 - 80 %G   RBC 4.38  4.04 - 5.48 M/uL   Hemoglobin 13.8  12.2 - 16.2 g/dL   HCT, POC 16.1  09.6 - 47.9 %   MCV 100.1 (*) 80 - 97 fL   MCH, POC 31.5 (*) 27 - 31.2 pg   MCHC 31.5 (*) 31.8 - 35.4 g/dL   RDW, POC 04.5     Platelet Count, POC 263  142 - 424 K/uL   MPV 8.0  0 - 99.8 fL     ASSESSMENT AND PLAN:  47 y.o. female with bronchitis and cough.  -Symptoms have been present for 1 week, afebrile, and outside of the Tamiflu window, so I will hold of on checking a flu today -Levaquin 500 mg 1 po daily #10 no RF -Proventil 2 puffs inhaled q 4-6 hours prn #1 no RF -Hycodan #4oz 1 tsp po q 4-6 hours prn cough no RF SED -Mucinex -Tylenol/Motrin prn -Rest/fluids -RTC precautions -RTC 3-5 days if no improvement  Signed, Eula Listen, PA-C [facility] [place], [place] [address] [phone] 03/09/2013 11:22 AM

## 2013-03-12 ENCOUNTER — Other Ambulatory Visit: Payer: Self-pay | Admitting: Family Medicine

## 2013-03-13 ENCOUNTER — Encounter: Payer: Self-pay | Admitting: Family Medicine

## 2013-03-13 NOTE — Telephone Encounter (Signed)
Pt was given a 30d supply 2 mos ago so has likely been out for over a month and at her last OV last week her BP was fine so I am concerned that if she restarts the verapamil her BP may go to low.  What have her BPs been ranging outside of the office?

## 2013-03-14 ENCOUNTER — Telehealth: Payer: Self-pay | Admitting: *Deleted

## 2013-03-14 NOTE — Telephone Encounter (Signed)
Ariel Mocha, MD at 03/13/2013 10:55 AM This is in regards to pharmacy request for verapamil.     Status: Signed        Pt was given a 30d supply 2 mos ago so has likely been out for over a month and at her last OV last week her BP was fine so I am concerned that if she restarts the verapamil her BP may go to low. What have her BPs been ranging outside of the office?   lmom to cb

## 2013-03-16 NOTE — Telephone Encounter (Signed)
Left message to get BP readings.

## 2013-03-16 NOTE — Telephone Encounter (Signed)
Left a message for patient to return call.

## 2013-03-17 ENCOUNTER — Telehealth: Payer: Self-pay

## 2013-03-17 NOTE — Telephone Encounter (Signed)
PATIENT STATES SHE RECEIVED A CALL FROM [facility] ON Friday FOR HER TO CALL BACK AND ASK FOR THE CLINICAL TEAM LEADER. SHE SAID SHE DOES NOT KNOW WHY WE HAVE ASKED HER TO CALL? BEST PHONE [phone] (CELL)   [name], [place]

## 2013-03-17 NOTE — Telephone Encounter (Signed)
Left message for rtn call. Need BP reading recently.

## 2013-03-17 NOTE — Telephone Encounter (Signed)
Duplicate message- need BP readings.

## 2013-03-20 ENCOUNTER — Telehealth: Payer: Self-pay | Admitting: Radiology

## 2013-03-20 ENCOUNTER — Telehealth: Payer: Self-pay

## 2013-03-20 NOTE — Telephone Encounter (Signed)
Unable to reach, letter sent

## 2013-03-20 NOTE — Telephone Encounter (Signed)
We have called multiple times, what are her blood pressure readings? Called again. Still unable to reach. Left another message, (detailed) letter sent also.

## 2013-03-20 NOTE — Telephone Encounter (Signed)
PT STATES SOMEONE CALLED HER THE OTHER DAY AND SHE ISN'T SURE WHY. [place] [phone]

## 2013-03-20 NOTE — Telephone Encounter (Signed)
Spoke to patient. She states she has not been checking her blood pressures. She will check them over the course of the next 1-2 weeks and call back with a report.

## 2013-03-28 NOTE — Telephone Encounter (Signed)
03/28/2013 - AMY - CAN THIS ENCOUNTER BE CLOSED?   [place], [place]

## 2013-05-31 ENCOUNTER — Ambulatory Visit (INDEPENDENT_AMBULATORY_CARE_PROVIDER_SITE_OTHER): Payer: BC Managed Care – PPO | Admitting: Internal Medicine

## 2013-05-31 VITALS — BP 128/92 | HR 78 | Temp 97.8°F | Resp 18 | Ht 60.5 in | Wt 166.0 lb

## 2013-05-31 DIAGNOSIS — G43909 Migraine, unspecified, not intractable, without status migrainosus: Secondary | ICD-10-CM

## 2013-05-31 MED ORDER — PROMETHAZINE HCL 12.5 MG PO TABS
25.0000 mg | ORAL_TABLET | Freq: Once | ORAL | Status: AC
  Administered 2013-05-31: 25 mg via ORAL

## 2013-05-31 MED ORDER — NALBUPHINE HCL 10 MG/ML IJ SOLN
10.0000 mg | Freq: Once | INTRAMUSCULAR | Status: AC
  Administered 2013-05-31: 10 mg via INTRAMUSCULAR

## 2013-05-31 MED ORDER — PROMETHAZINE HCL 25 MG/ML IJ SOLN: 25.0000 mg | Freq: Once | INTRAMUSCULAR | Status: DC

## 2013-05-31 MED ORDER — NALBUPHINE HCL 10 MG/ML IJ SOLN: 10.0000 mg | Freq: Once | INTRAMUSCULAR | Status: DC

## 2013-05-31 NOTE — Progress Notes (Signed)
   Subjective:    Patient ID: [name], female    DOB: 20-Jun-1965, 48 y.o.   MRN: [id number]  HPI  Patient presents today with a Migraine for the last 2 days. We are her primary caregivers. She has a history of Migraines with the visual aura and usually her menstrual cycle is a big trigger to the headaches. The aura she had this time before the headache started was black dots. She states tension also triggers them. She has been getting them less frequently lately. She has come in in the past for a Toradol injection and frequently that has helped. She states she does have worsening Migraines when she takes Zofran. She checks her BP every day and more often when her migraine start.   On tenormin and bp normal.  Review of Systems     Objective:   Physical Exam  Vitals reviewed. Constitutional: She is oriented to person, place, and time. She appears well-developed and well-nourished. She appears distressed.  HENT:  Head: Normocephalic.  Mouth/Throat: Oropharynx is clear and moist.  Eyes: EOM are normal. Pupils are equal, round, and reactive to light.  Neck: Normal range of motion. Neck supple.  Cardiovascular: Normal rate, regular rhythm and normal heart sounds.   Pulmonary/Chest: Effort normal and breath sounds normal.  Musculoskeletal: Normal range of motion.  Neurological: She is alert and oriented to person, place, and time. No cranial nerve deficit or sensory deficit. She exhibits normal muscle tone. She displays a negative Romberg sign. Coordination and gait normal.  Balance and drift normal  Psychiatric: She has a normal mood and affect. Her behavior is normal. Judgment and thought content normal.          Assessment & Plan:  Acute migrain with aura Rec establish with one PA or MD 104 to sort out prevention/cpe Nubain 10mg /phenergan 25mg  IM Phenergan po for home use/reconsider tryptans and prevention

## 2013-05-31 NOTE — Patient Instructions (Signed)
Migraine Headache A migraine headache is an intense, throbbing pain on one or both sides of your head. A migraine can last for 30 minutes to several hours. CAUSES  The exact cause of a migraine headache is not always known. However, a migraine may be caused when nerves in the brain become irritated and release chemicals that cause inflammation. This causes pain. Certain things may also trigger migraines, such as:  Alcohol.  Smoking.  Stress.  Menstruation.  Aged cheeses.  Foods or drinks that contain nitrates, glutamate, aspartame, or tyramine.  Lack of sleep.  Chocolate.  Caffeine.  Hunger.  Physical exertion.  Fatigue.  Medicines used to treat chest pain (nitroglycerine), birth control pills, estrogen, and some blood pressure medicines. SIGNS AND SYMPTOMS  Pain on one or both sides of your head.  Pulsating or throbbing pain.  Severe pain that prevents daily activities.  Pain that is aggravated by any physical activity.  Nausea, vomiting, or both.  Dizziness.  Pain with exposure to bright lights, loud noises, or activity.  General sensitivity to bright lights, loud noises, or smells. Before you get a migraine, you may get warning signs that a migraine is coming (aura). An aura may include:  Seeing flashing lights.  Seeing bright spots, halos, or zig-zag lines.  Having tunnel vision or blurred vision.  Having feelings of numbness or tingling.  Having trouble talking.  Having muscle weakness. DIAGNOSIS  A migraine headache is often diagnosed based on:  Symptoms.  Physical exam.  A CT scan or MRI of your head. These imaging tests cannot diagnose migraines, but they can help rule out other causes of headaches. TREATMENT Medicines may be given for pain and nausea. Medicines can also be given to help prevent recurrent migraines.  HOME CARE INSTRUCTIONS  Only take over-the-counter or prescription medicines for pain or discomfort as directed by your  health care provider. The use of long-term narcotics is not recommended.  Lie down in a dark, quiet room when you have a migraine.  Keep a journal to find out what may trigger your migraine headaches. For example, write down:  What you eat and drink.  How much sleep you get.  Any change to your diet or medicines.  Limit alcohol consumption.  Quit smoking if you smoke.  Get 7 9 hours of sleep, or as recommended by your health care provider.  Limit stress.  Keep lights dim if bright lights bother you and make your migraines worse. SEEK IMMEDIATE MEDICAL CARE IF:   Your migraine becomes severe.  You have a fever.  You have a stiff neck.  You have vision loss.  You have muscular weakness or loss of muscle control.  You start losing your balance or have trouble walking.  You feel faint or pass out.  You have severe symptoms that are different from your first symptoms. MAKE SURE YOU:   Understand these instructions.  Will watch your condition.  Will get help right away if you are not doing well or get worse. Document Released: 03/29/2005 Document Revised: 01/17/2013 Document Reviewed: 12/04/2012 [employer] Patient Information 2014 [employer].  

## 2013-05-31 NOTE — Progress Notes (Signed)
   Subjective:    Patient ID: [name], female    DOB: 12-Mar-1966, 48 y.o.   MRN: [id number]  HPI    Review of Systems     Objective:   Physical Exam        Assessment & Plan:

## 2013-11-02 ENCOUNTER — Encounter (HOSPITAL_COMMUNITY): Payer: Self-pay | Admitting: Emergency Medicine

## 2013-11-02 ENCOUNTER — Emergency Department (HOSPITAL_COMMUNITY)
Admission: EM | Admit: 2013-11-02 | Discharge: 2013-11-02 | Disposition: A | Discharge status: Home / Self Care | Payer: BC Managed Care – PPO | Attending: Emergency Medicine | Admitting: Emergency Medicine

## 2013-11-02 DIAGNOSIS — G43009 Migraine without aura, not intractable, without status migrainosus: Secondary | ICD-10-CM | POA: Insufficient documentation

## 2013-11-02 DIAGNOSIS — Z8719 Personal history of other diseases of the digestive system: Secondary | ICD-10-CM | POA: Insufficient documentation

## 2013-11-02 DIAGNOSIS — Z87440 Personal history of urinary (tract) infections: Secondary | ICD-10-CM | POA: Insufficient documentation

## 2013-11-02 DIAGNOSIS — G43109 Migraine with aura, not intractable, without status migrainosus: Secondary | ICD-10-CM | POA: Insufficient documentation

## 2013-11-02 DIAGNOSIS — R110 Nausea: Secondary | ICD-10-CM | POA: Insufficient documentation

## 2013-11-02 MED ORDER — KETOROLAC TROMETHAMINE 30 MG/ML IJ SOLN
30.0000 mg | Freq: Once | INTRAMUSCULAR | Status: AC
  Administered 2013-11-02: 30 mg via INTRAVENOUS
  Filled 2013-11-02: qty 1

## 2013-11-02 MED ORDER — METOCLOPRAMIDE HCL 5 MG/ML IJ SOLN
10.0000 mg | Freq: Once | INTRAMUSCULAR | Status: AC
  Administered 2013-11-02: 10 mg via INTRAVENOUS
  Filled 2013-11-02: qty 2

## 2013-11-02 MED ORDER — DIPHENHYDRAMINE HCL 50 MG/ML IJ SOLN
12.5000 mg | Freq: Once | INTRAMUSCULAR | Status: AC
  Administered 2013-11-02: 12.5 mg via INTRAVENOUS
  Filled 2013-11-02: qty 1

## 2013-11-02 NOTE — ED Provider Notes (Signed)
CSN: [id number]     Arrival date & time 11/02/13  1826 History   First MD Initiated Contact with Patient 11/02/13 1838     Chief Complaint  Patient presents with  . Migraine  . Nausea     (Consider location/radiation/quality/duration/timing/severity/associated sxs/prior Treatment) Patient is a 48 y.o. female presenting with migraines. The history is provided by the patient. No language interpreter was used.  Migraine This is a new problem. The current episode started today. Associated symptoms include headaches and nausea. Pertinent negatives include no chills, fever or vomiting. Associated symptoms comments: Headache since this morning typical of her migraine history. HA located behind her left eye - typical location. She has had nausea without significant vomiting. No visual changes, fever..    Past Medical History  Diagnosis Date  . Migraine with visual aura   . UTI (lower urinary tract infection)   . GERD (gastroesophageal reflux disease)   . Celiac disease    Past Surgical History  Procedure Laterality Date  . Tubal ligation    . Exploratory laparotomy    . Bladder suspension     Family History  Problem Relation Age of Onset  . Diabetes Son 12    type 1   History  Substance Use Topics  . Smoking status: Never Smoker   . Smokeless tobacco: Never Used  . Alcohol Use: No   OB History   Grav Para Term Preterm Abortions TAB SAB Ect Mult Living                 Review of Systems  Constitutional: Negative for fever and chills.  Eyes: Positive for photophobia.  Respiratory: Negative.   Cardiovascular: Negative.   Gastrointestinal: Positive for nausea. Negative for vomiting.  Musculoskeletal: Negative.   Skin: Negative.   Neurological: Positive for headaches.      Allergies  Gluten meal and Ondansetron  Home Medications   Prior to Admission medications   Medication Sig Start Date End Date Taking? Authorizing Provider  aspirin-acetaminophen-caffeine  (EXCEDRIN MIGRAINE) [phone] MG per tablet Take 2 tablets by mouth every 6 (six) hours as needed for pain.    Yes Historical Provider, MD  ibuprofen (ADVIL,MOTRIN) 200 MG tablet Take 800 mg by mouth every 6 (six) hours as needed (pain).   Yes Historical Provider, MD   BP 150/88  Pulse 72  Temp(Src) 98.3 F (36.8 C) (Oral)  Resp 16  SpO2 99% Physical Exam  Constitutional: She is oriented to person, place, and time. She appears well-developed and well-nourished. No distress.  HENT:  Head: Normocephalic.  Eyes: Pupils are equal, round, and reactive to light.  Neck: Normal range of motion.  Pulmonary/Chest: Effort normal.  Abdominal: Soft. There is no tenderness.  Musculoskeletal: Normal range of motion.  Neurological: She is alert and oriented to person, place, and time.  CN's 3-12 intact. Speech clear and focused. No deficits of coordination.     ED Course  Procedures (including critical care time) Labs Review Labs Reviewed - No data to display  Imaging Review No results found.   EKG Interpretation None      MDM   Final diagnoses:  None    1. Migraine HA  Symptoms today are similar in pattern to her history of migraine headaches. No neurologic deficits on exam. Pain is resolved with headache cocktail given IV with Toradol. Stable for discharge.     Dewaine Oats, PA-C 11/03/13 1723

## 2013-11-02 NOTE — Discharge Instructions (Signed)

## 2013-11-02 NOTE — ED Notes (Signed)
Pt reports hx of migraines, migraine started this morning. Pain 10/10. Sensitivity to light and sound. Reports nausea. Denies vomiting.

## 2013-11-10 NOTE — ED Provider Notes (Signed)
Medical screening examination/treatment/procedure(s) were performed by non-physician practitioner and as supervising physician I was immediately available for consultation/collaboration.   EKG Interpretation None        Tanna Furry, MD 11/10/13 1940

## 2014-05-22 ENCOUNTER — Ambulatory Visit (INDEPENDENT_AMBULATORY_CARE_PROVIDER_SITE_OTHER): Payer: BLUE CROSS/BLUE SHIELD | Admitting: Family Medicine

## 2014-05-22 VITALS — BP 142/90 | HR 82 | Temp 98.2°F | Resp 18 | Ht 62.0 in | Wt 158.0 lb

## 2014-05-22 DIAGNOSIS — K2100 Gastro-esophageal reflux disease with esophagitis, without bleeding: Secondary | ICD-10-CM

## 2014-05-22 DIAGNOSIS — N921 Excessive and frequent menstruation with irregular cycle: Secondary | ICD-10-CM

## 2014-05-22 DIAGNOSIS — K5900 Constipation, unspecified: Secondary | ICD-10-CM

## 2014-05-22 DIAGNOSIS — R350 Frequency of micturition: Secondary | ICD-10-CM

## 2014-05-22 LAB — POCT UA - MICROSCOPIC ONLY
Casts, Ur, LPF, POC: NEGATIVE
Crystals, Ur, HPF, POC: NEGATIVE
Mucus, UA: NEGATIVE
RBC, urine, microscopic: NEGATIVE
Yeast, UA: NEGATIVE

## 2014-05-22 LAB — COMPREHENSIVE METABOLIC PANEL
ALT: 14 U/L (ref 0–35)
AST: 19 U/L (ref 0–37)
Albumin: 3.6 g/dL (ref 3.5–5.2)
Alkaline Phosphatase: 60 U/L (ref 39–117)
BUN: 12 mg/dL (ref 6–23)
CO2: 26 mEq/L (ref 19–32)
Calcium: 8.6 mg/dL (ref 8.4–10.5)
Chloride: 104 mEq/L (ref 96–112)
Creat: 0.65 mg/dL (ref 0.50–1.10)
Glucose, Bld: 105 mg/dL — ABNORMAL HIGH (ref 70–99)
Potassium: 4.1 mEq/L (ref 3.5–5.3)
Sodium: 138 mEq/L (ref 135–145)
Total Bilirubin: 0.6 mg/dL (ref 0.2–1.2)
Total Protein: 6.2 g/dL (ref 6.0–8.3)

## 2014-05-22 LAB — POCT URINALYSIS DIPSTICK
Bilirubin, UA: NEGATIVE
Glucose, UA: NEGATIVE
Ketones, UA: NEGATIVE
Leukocytes, UA: NEGATIVE
Nitrite, UA: NEGATIVE
Protein, UA: NEGATIVE
Spec Grav, UA: 1.02
Urobilinogen, UA: 0.2
pH, UA: 7

## 2014-05-22 LAB — POCT CBC
Granulocyte percent: 73.5 %G (ref 37–80)
HCT, POC: 41.5 % (ref 37.7–47.9)
Hemoglobin: 13.4 g/dL (ref 12.2–16.2)
Lymph, poc: 1 (ref 0.6–3.4)
MCH, POC: 30.6 pg (ref 27–31.2)
MCHC: 32.3 g/dL (ref 31.8–35.4)
MCV: 94.8 fL (ref 80–97)
MID (cbc): 0.4 (ref 0–0.9)
MPV: 7.5 fL (ref 0–99.8)
POC Granulocyte: 3.9 (ref 2–6.9)
POC LYMPH PERCENT: 19.6 %L (ref 10–50)
POC MID %: 6.9 %M (ref 0–12)
Platelet Count, POC: 294 10*3/uL (ref 142–424)
RBC: 4.38 M/uL (ref 4.04–5.48)
RDW, POC: 14.6 %
WBC: 5.3 10*3/uL (ref 4.6–10.2)

## 2014-05-22 LAB — TSH: TSH: 2.022 u[IU]/mL (ref 0.350–4.500)

## 2014-05-22 LAB — POCT GLYCOSYLATED HEMOGLOBIN (HGB A1C): Hemoglobin A1C: 5.1

## 2014-05-22 MED ORDER — CIPROFLOXACIN HCL 250 MG PO TABS: 250.0000 mg | ORAL_TABLET | Freq: Two times a day (BID) | ORAL | Status: DC

## 2014-05-22 MED ORDER — RANITIDINE HCL 150 MG PO TABS: 150.0000 mg | ORAL_TABLET | Freq: Two times a day (BID) | ORAL | Status: DC

## 2014-05-22 MED ORDER — PROMETHAZINE HCL 12.5 MG PO TABS: 12.5000 mg | ORAL_TABLET | Freq: Three times a day (TID) | ORAL | Status: DC | PRN

## 2014-05-22 NOTE — Addendum Note (Signed)
Addended by: Robyn Haber on: 05/22/2014 09:23 AM   Modules accepted: Orders

## 2014-05-22 NOTE — Progress Notes (Signed)
49 yo woman who works at [employer] with several problems:  1. Urinary frequency with recurrent UTI's  -this episode is similar to the others  -has been hospitalized with pyelo  - no burning now, but she does have some urgency and hesitancy with an odor  -h/o bladder sling 6-7 years ago  2. Chronic vomiting (almost daily) for six months, associated with nausea, irrespective of meals  - has been diagnosed with GERD and celiac disease after endoscopy (Dr. Collene Mares)  - she cannot manage the gluten free diet  - has taken Nexium with some relief  3. Vaginal spotting for several months  -sometimes associated with cramps  -persistent low back pain  -some dyspareunia and bleeding after intercourse  - no abnormal paps, several years since last pap  4. Constipation not responding to prune juice   Objective:  HEENT:  Normal TM's, missing some teeth with broken tooth #9 Neck: supple, no adenopathy or thyromegaly Chest:  Clear Heart:  Reg, no murmur or gallop Abdomen:  Slight LLQ pain, no HSM or masses, soft Skin:  Warm and dry, no suspicious lesions or rash Extrem:  Full ROM Neuro:  Alert, appropriate, CN III-XII intact  Results for orders placed or performed in visit on 05/22/14  POCT urinalysis dipstick  Result Value Ref Range   Color, UA yellow    Clarity, UA cloudy    Glucose, UA neg    Bilirubin, UA neg    Ketones, UA neg    Spec Grav, UA 1.020    Blood, UA Trace    pH, UA 7.0    Protein, UA neg    Urobilinogen, UA 0.2    Nitrite, UA neg    Leukocytes, UA Negative   POCT UA - Microscopic Only  Result Value Ref Range   WBC, Ur, HPF, POC 0-1    RBC, urine, microscopic neg    Bacteria, U Microscopic Large    Mucus, UA neg    Epithelial cells, urine per micros 0-1    Crystals, Ur, HPF, POC neg    Casts, Ur, LPF, POC neg    Yeast, UA neg   POCT CBC  Result Value Ref Range   WBC 5.3 4.6 - 10.2 K/uL   Lymph, poc 1.0 0.6 - 3.4   POC LYMPH PERCENT 19.6 10 - 50 %L   MID  (cbc) 0.4 0 - 0.9   POC MID % 6.9 0 - 12 %M   POC Granulocyte 3.9 2 - 6.9   Granulocyte percent 73.5 37 - 80 %G   RBC 4.38 4.04 - 5.48 M/uL   Hemoglobin 13.4 12.2 - 16.2 g/dL   HCT, POC 41.5 37.7 - 47.9 %   MCV 94.8 80 - 97 fL   MCH, POC 30.6 27 - 31.2 pg   MCHC 32.3 31.8 - 35.4 g/dL   RDW, POC 14.6 %   Platelet Count, POC 294 142 - 424 K/uL   MPV 7.5 0 - 99.8 fL  POCT glycosylated hemoglobin (Hb A1C)  Result Value Ref Range   Hemoglobin A1C 5.1        ICD-9-CM ICD-10-CM   1. Frequent urination 788.41 R35.0 POCT urinalysis dipstick     POCT UA - Microscopic Only     Urine culture     POCT CBC     POCT glycosylated hemoglobin (Hb A1C)     Comprehensive metabolic panel  2. Gastroesophageal reflux disease with esophagitis 530.11 K21.0 ranitidine (ZANTAC) 150 MG tablet  promethazine (PHENERGAN) 12.5 MG tablet     POCT CBC  3. Constipation, unspecified constipation type 564.00 K59.00 TSH  4. Menorrhagia with irregular cycle 626.2 N92.1 POCT CBC     TSH     Ambulatory referral to Gynecology     Signed, Robyn Haber, MD

## 2014-05-24 ENCOUNTER — Telehealth: Payer: Self-pay

## 2014-05-24 ENCOUNTER — Other Ambulatory Visit: Payer: Self-pay | Admitting: Family Medicine

## 2014-05-24 DIAGNOSIS — N3000 Acute cystitis without hematuria: Secondary | ICD-10-CM

## 2014-05-24 MED ORDER — AMOXICILLIN 875 MG PO TABS
875.0000 mg | ORAL_TABLET | Freq: Two times a day (BID) | ORAL | Status: DC
Start: 2014-05-24 — End: 2014-06-13

## 2014-05-24 NOTE — Telephone Encounter (Signed)
Patient states she received a call from our office and is returning the call. Patient not sure who called but feels it is in regards to her lab results. Please call patient at [phone]

## 2014-05-25 LAB — URINE CULTURE: Colony Count: >=100000

## 2014-05-25 NOTE — Telephone Encounter (Signed)
See labs 

## 2014-06-13 ENCOUNTER — Ambulatory Visit (INDEPENDENT_AMBULATORY_CARE_PROVIDER_SITE_OTHER): Payer: BLUE CROSS/BLUE SHIELD | Admitting: Women's Health

## 2014-06-13 ENCOUNTER — Encounter: Payer: Self-pay | Admitting: Women's Health

## 2014-06-13 ENCOUNTER — Other Ambulatory Visit (HOSPITAL_COMMUNITY)
Admission: RE | Admit: 2014-06-13 | Discharge: 2014-06-13 | Disposition: A | Discharge status: Home / Self Care | Payer: BLUE CROSS/BLUE SHIELD | Source: Ambulatory Visit | Attending: Women's Health | Admitting: Women's Health

## 2014-06-13 VITALS — BP 128/88 | Ht 61.0 in | Wt 160.0 lb

## 2014-06-13 DIAGNOSIS — N898 Other specified noninflammatory disorders of vagina: Secondary | ICD-10-CM | POA: Diagnosis not present

## 2014-06-13 DIAGNOSIS — N938 Other specified abnormal uterine and vaginal bleeding: Secondary | ICD-10-CM

## 2014-06-13 DIAGNOSIS — Z01419 Encounter for gynecological examination (general) (routine) without abnormal findings: Secondary | ICD-10-CM

## 2014-06-13 DIAGNOSIS — Z01411 Encounter for gynecological examination (general) (routine) with abnormal findings: Secondary | ICD-10-CM | POA: Insufficient documentation

## 2014-06-13 DIAGNOSIS — A499 Bacterial infection, unspecified: Secondary | ICD-10-CM

## 2014-06-13 DIAGNOSIS — N760 Acute vaginitis: Secondary | ICD-10-CM | POA: Diagnosis not present

## 2014-06-13 DIAGNOSIS — Z1151 Encounter for screening for human papillomavirus (HPV): Secondary | ICD-10-CM | POA: Insufficient documentation

## 2014-06-13 DIAGNOSIS — B9689 Other specified bacterial agents as the cause of diseases classified elsewhere: Secondary | ICD-10-CM

## 2014-06-13 LAB — WET PREP FOR TRICH, YEAST, CLUE
Trich, Wet Prep: NONE SEEN
WBC, Wet Prep HPF POC: NONE SEEN
Yeast Wet Prep HPF POC: NONE SEEN

## 2014-06-13 MED ORDER — METRONIDAZOLE 0.75 % VA GEL: VAGINAL | Status: DC

## 2014-06-13 NOTE — Progress Notes (Signed)
Anaisa Everage 01/19/1966 [id number]    History:    Presents for annual exam.  Regular monthly 5-6 day cycle until approximately 4 months ago when spotting/bleeding most days of the month started. BTL. History of bladder suspension. Primary care manages celiac disease, migraines and GERD. Greater than 10 years ago for a Pap and has not had a mammogram. Good relationship with husband, denies need for STD screening. Normal TSH and labs at primary care. Negative colonoscopy 2013.  Past medical history, past surgical history, family history and social history were all reviewed and documented in the EPIC chart. Maternal grandmother had breast cancer when old her. Parents heart disease. Son 88 has type 1 diabetes diagnosed at age 51, poor health. 2 other children healthy. Careers adviser.  ROS:  A ROS was performed and pertinent positives and negatives are included.  Exam:  Filed Vitals:   06/13/14 1005  BP: 128/88    General appearance:  Normal Thyroid:  Symmetrical, normal in size, without palpable masses or nodularity. Respiratory  Auscultation:  Clear without wheezing or rhonchi Cardiovascular  Auscultation:  Regular rate, without rubs, murmurs or gallops  Edema/varicosities:  Not grossly evident Abdominal  Soft,nontender, without masses, guarding or rebound.  Liver/spleen:  No organomegaly noted  Hernia:  None appreciated  Skin  Inspection:  Grossly normal   Breasts: Examined lying and sitting.     Right: Without masses, retractions, discharge or axillary adenopathy.     Left: Without masses, retractions, discharge or axillary adenopathy. Gentitourinary   Inguinal/mons:  Normal without inguinal adenopathy  External genitalia:  Normal  BUS/Urethra/Skene's glands:  Normal  Vagina:  Moderate bloody discharge, wet prep positive for many clues, TNTC bacteria and amines.  Cervix:  2 cm lobe on left cervix, not in os  Uterus:   normal in size, shape and contour.   Midline and mobile  Adnexa/parametria:     Rt: Without masses or tenderness.   Lt: Without masses or tenderness.  Anus and perineum: Normal  Digital rectal exam: Normal sphincter tone without palpated masses or tenderness  Assessment/Plan:  49 y.o. MWF G3P3 for annual exam.     Monthly cycle with spotting/bleeding most days of the month for past 4 months BTL Bacteria vaginosis GERD/celiac disease-primary care manages labs and meds  Plan: Sonohysterogram after this cycle if can be scheduled or after next with Dr. Phineas Real. SBE's, reviewed importance of annual screening mammogram, breast center information given and reviewed, instructed to schedule. Reviewed importance of increasing regular exercise, calcium rich diet, vitamin D 2000 daily encouraged. MetroGel vaginal cream 1 applicator at bedtime 5, alcohol precautions reviewed,.UA, Pap with HR HPV typing, new screening guidelines reviewed.     Huel Cote [facility], 11:42 AM 06/13/2014

## 2014-06-13 NOTE — Patient Instructions (Signed)
Health Recommendations for Postmenopausal Women Respected and ongoing research has looked at the most common causes of death, disability, and poor quality of life in postmenopausal women. The causes include heart disease, diseases of blood vessels, diabetes, depression, cancer, and bone loss (osteoporosis). Many things can be done to help lower the chances of developing these and other common problems. CARDIOVASCULAR DISEASE Heart Disease: A heart attack is a medical emergency. Know the signs and symptoms of a heart attack. Below are things women can do to reduce their risk for heart disease.   Do not smoke. If you smoke, quit.  Aim for a healthy weight. Being overweight causes many preventable deaths. Eat a healthy and balanced diet and drink an adequate amount of liquids.  Get moving. Make a commitment to be more physically active. Aim for 30 minutes of activity on most, if not all days of the week.  Eat for heart health. Choose a diet that is low in saturated fat and cholesterol and eliminate trans fat. Include whole grains, vegetables, and fruits. Read and understand the labels on food containers before buying.  Know your numbers. Ask your caregiver to check your blood pressure, cholesterol (total, HDL, LDL, triglycerides) and blood glucose. Work with your caregiver on improving your entire clinical picture.  High blood pressure. Limit or stop your table salt intake (try salt substitute and food seasonings). Avoid salty foods and drinks. Read labels on food containers before buying. Eating well and exercising can help control high blood pressure. STROKE  Stroke is a medical emergency. Stroke may be the result of a blood clot in a blood vessel in the brain or by a brain hemorrhage (bleeding). Know the signs and symptoms of a stroke. To lower the risk of developing a stroke:  Avoid fatty foods.  Quit smoking.  Control your diabetes, blood pressure, and irregular heart rate. THROMBOPHLEBITIS  (BLOOD CLOT) OF THE LEG  Becoming overweight and leading a stationary lifestyle may also contribute to developing blood clots. Controlling your diet and exercising will help lower the risk of developing blood clots. CANCER SCREENING  Breast Cancer: Take steps to reduce your risk of breast cancer.  You should practice "breast self-awareness." This means understanding the normal appearance and feel of your breasts and should include breast self-examination. Any changes detected, no matter how small, should be reported to your caregiver.  After age 40, you should have a clinical breast exam (CBE) every year.  Starting at age 40, you should consider having a mammogram (breast X-ray) every year.  If you have a family history of breast cancer, talk to your caregiver about genetic screening.  If you are at high risk for breast cancer, talk to your caregiver about having an MRI and a mammogram every year.  Intestinal or Stomach Cancer: Tests to consider are a rectal exam, fecal occult blood, sigmoidoscopy, and colonoscopy. Women who are high risk may need to be screened at an earlier age and more often.  Cervical Cancer:  Beginning at age 30, you should have a Pap test every 3 years as long as the past 3 Pap tests have been normal.  If you have had past treatment for cervical cancer or a condition that could lead to cancer, you need Pap tests and screening for cancer for at least 20 years after your treatment.  If you had a hysterectomy for a problem that was not cancer or a condition that could lead to cancer, then you no longer need Pap tests.    If you are between ages 65 and 70, and you have had normal Pap tests going back 10 years, you no longer need Pap tests.  If Pap tests have been discontinued, risk factors (such as a new sexual partner) need to be reassessed to determine if screening should be resumed.  Some medical problems can increase the chance of getting cervical cancer. In these  cases, your caregiver may recommend more frequent screening and Pap tests.  Uterine Cancer: If you have vaginal bleeding after reaching menopause, you should notify your caregiver.  Ovarian Cancer: Other than yearly pelvic exams, there are no reliable tests available to screen for ovarian cancer at this time except for yearly pelvic exams.  Lung Cancer: Yearly chest X-rays can detect lung cancer and should be done on high risk women, such as cigarette smokers and women with chronic lung disease (emphysema).  Skin Cancer: A complete body skin exam should be done at your yearly examination. Avoid overexposure to the sun and ultraviolet light lamps. Use a strong sun block cream when in the sun. All of these things are important for lowering the risk of skin cancer. MENOPAUSE Menopause Symptoms: Hormone therapy products are effective for treating symptoms associated with menopause:  Moderate to severe hot flashes.  Night sweats.  Mood swings.  Headaches.  Tiredness.  Loss of sex drive.  Insomnia.  Other symptoms. Hormone replacement carries certain risks, especially in older women. Women who use or are thinking about using estrogen or estrogen with progestin treatments should discuss that with their caregiver. Your caregiver will help you understand the benefits and risks. The ideal dose of hormone replacement therapy is not known. The Food and Drug Administration (FDA) has concluded that hormone therapy should be used only at the lowest doses and for the shortest amount of time to reach treatment goals.  OSTEOPOROSIS Protecting Against Bone Loss and Preventing Fracture If you use hormone therapy for prevention of bone loss (osteoporosis), the risks for bone loss must outweigh the risk of the therapy. Ask your caregiver about other medications known to be safe and effective for preventing bone loss and fractures. To guard against bone loss or fractures, the following is recommended:  If  you are younger than age 50, take 1000 mg of calcium and at least 600 mg of Vitamin D per day.  If you are older than age 50 but younger than age 70, take 1200 mg of calcium and at least 600 mg of Vitamin D per day.  If you are older than age 70, take 1200 mg of calcium and at least 800 mg of Vitamin D per day. Smoking and excessive alcohol intake increases the risk of osteoporosis. Eat foods rich in calcium and vitamin D and do weight bearing exercises several times a week as your caregiver suggests. DIABETES Diabetes Mellitus: If you have type I or type 2 diabetes, you should keep your blood sugar under control with diet, exercise, and recommended medication. Avoid starchy and fatty foods, and too many sweets. Being overweight can make diabetes control more difficult. COGNITION AND MEMORY Cognition and Memory: Menopausal hormone therapy is not recommended for the prevention of cognitive disorders such as Alzheimer's disease or memory loss.  DEPRESSION  Depression may occur at any age, but it is common in elderly women. This may be because of physical, medical, social (loneliness), or financial problems and needs. If you are experiencing depression because of medical problems and control of symptoms, talk to your caregiver about this. Physical   activity and exercise may help with mood and sleep. Community and volunteer involvement may improve your sense of value and worth. If you have depression and you feel that the problem is getting worse or becoming severe, talk to your caregiver about which treatment options are best for you. ACCIDENTS  Accidents are common and can be serious in elderly woman. Prepare your house to prevent accidents. Eliminate throw rugs, place hand bars in bath, shower, and toilet areas. Avoid wearing high heeled shoes or walking on wet, snowy, and icy areas. Limit or stop driving if you have vision or hearing problems, or if you feel you are unsteady with your movements and  reflexes. HEPATITIS C Hepatitis C is a type of viral infection affecting the liver. It is spread mainly through contact with blood from an infected person. It can be treated, but if left untreated, it can lead to severe liver damage over the years. Many people who are infected do not know that the virus is in their blood. If you are a "baby-boomer", it is recommended that you have one screening test for Hepatitis C. IMMUNIZATIONS  Several immunizations are important to consider having during your senior years, including:   Tetanus, diphtheria, and pertussis booster shot.  Influenza every year before the flu season begins.  Pneumonia vaccine.  Shingles vaccine.  Others, as indicated based on your specific needs. Talk to your caregiver about these. Document Released: 05/21/2005 Document Revised: 08/13/2013 Document Reviewed: 01/15/2008 [facility] Patient Information 2015 [place], [place]. This information is not intended to replace advice given to you by your health care provider. Make sure you discuss any questions you have with your health care provider. Dysfunctional Uterine Bleeding Normally, menstrual periods begin between ages 28 to 23 in [name] women. A normal menstrual cycle/period may begin every 23 days up to 35 days and lasts from 1 to 7 days. Around 12 to 14 days before your menstrual period starts, ovulation (ovary produces an egg) occurs. When counting the time between menstrual periods, count from the first day of bleeding of the previous period to the first day of bleeding of the next period. Dysfunctional (abnormal) uterine bleeding is bleeding that is different from a normal menstrual period. Your periods may come earlier or later than usual. They may be lighter, have blood clots or be heavier. You may have bleeding between periods, or you may skip one period or more. You may have bleeding after sexual intercourse, bleeding after menopause, or no menstrual period. CAUSES    Pregnancy (normal, miscarriage, tubal).  IUDs (intrauterine device, birth control).  Birth control pills.  Hormone treatment.  Menopause.  Infection of the cervix.  Blood clotting problems.  Infection of the inside lining of the uterus.  Endometriosis, inside lining of the uterus growing in the pelvis and other female organs.  Adhesions (scar tissue) inside the uterus.  Obesity or severe weight loss.  Uterine polyps inside the uterus.  Cancer of the vagina, cervix, or uterus.  Ovarian cysts or polycystic ovary syndrome.  Medical problems (diabetes, thyroid disease).  Uterine fibroids (noncancerous tumor).  Problems with your female hormones.  Endometrial hyperplasia, very thick lining and enlarged cells inside of the uterus.  Medicines that interfere with ovulation.  Radiation to the pelvis or abdomen.  Chemotherapy. DIAGNOSIS   Your doctor will discuss the history of your menstrual periods, medicines you are taking, changes in your weight, stress in your life, and any medical problems you may have.  Your doctor will do a  physical and pelvic examination.  Your doctor may want to perform certain tests to make a diagnosis, such as:  Pap test.  Blood tests.  Cultures for infection.  CT scan.  Ultrasound.  Hysteroscopy.  Laparoscopy.  MRI.  Hysterosalpingography.  D and C.  Endometrial biopsy. TREATMENT  Treatment will depend on the cause of the dysfunctional uterine bleeding (DUB). Treatment may include:  Observing your menstrual periods for a couple of months.  Prescribing medicines for medical problems, including:  Antibiotics.  Hormones.  Birth control pills.  Removing an IUD (intrauterine device, birth control).  Surgery:  D and C (scrape and remove tissue from inside the uterus).  Laparoscopy (examine inside the abdomen with a lighted tube).  Uterine ablation (destroy lining of the uterus with electrical current, laser,  heat, or freezing).  Hysteroscopy (examine cervix and uterus with a lighted tube).  Hysterectomy (remove the uterus). HOME CARE INSTRUCTIONS   If medicines were prescribed, take exactly as directed. Do not change or switch medicines without consulting your caregiver.  Long term heavy bleeding may result in iron deficiency. Your caregiver may have prescribed iron pills. They help replace the iron that your body lost from heavy bleeding. Take exactly as directed.  Do not take aspirin or medicines that contain aspirin one week before or during your menstrual period. Aspirin may make the bleeding worse.  If you need to change your sanitary pad or tampon more than once every 2 hours, stay in bed with your feet elevated and a cold pack on your lower abdomen. Rest as much as possible, until the bleeding stops or slows down.  Eat well-balanced meals. Eat foods high in iron. Examples are:  Leafy green vegetables.  Whole-grain breads and cereals.  Eggs.  Meat.  Liver.  Do not try to lose weight until the abnormal bleeding has stopped and your blood iron level is back to normal. Do not lift more than ten pounds or do strenuous activities when you are bleeding.  For a couple of months, make note on your calendar, marking the start and ending of your period, and the type of bleeding (light, medium, heavy, spotting, clots or missed periods). This is for your caregiver to better evaluate your problem. SEEK MEDICAL CARE IF:   You develop nausea (feeling sick to your stomach) and vomiting, dizziness, or diarrhea while you are taking your medicine.  You are getting lightheaded or weak.  You have any problems that may be related to the medicine you are taking.  You develop pain with your DUB.  You want to remove your IUD.  You want to stop or change your birth control pills or hormones.  You have any type of abnormal bleeding mentioned above.  You are over 24 years old and have not had a  menstrual period yet.  You are 49 years old and you are still having menstrual periods.  You have any of the symptoms mentioned above.  You develop a rash. SEEK IMMEDIATE MEDICAL CARE IF:   An oral temperature above 102 F (38.9 C) develops.  You develop chills.  You are changing your sanitary pad or tampon more than once an hour.  You develop abdominal pain.  You pass out or faint. Document Released: 03/26/2000 Document Revised: 06/21/2011 Document Reviewed: 02/25/2009 [facility] Patient Information 2015 [place], [place]. This information is not intended to replace advice given to you by your health care provider. Make sure you discuss any questions you have with your health care provider.

## 2014-06-13 NOTE — Addendum Note (Signed)
Addended by: Burnett Kanaris on: 06/13/2014 02:43 PM   Modules accepted: Orders

## 2014-06-14 LAB — URINALYSIS W MICROSCOPIC + REFLEX CULTURE
Bacteria, UA: NONE SEEN
Bilirubin Urine: NEGATIVE
Casts: NONE SEEN
Crystals: NONE SEEN
Glucose, UA: NEGATIVE mg/dL
Hgb urine dipstick: NEGATIVE
Ketones, ur: NEGATIVE mg/dL
Leukocytes, UA: NEGATIVE
Nitrite: NEGATIVE
Protein, ur: NEGATIVE mg/dL
Specific Gravity, Urine: 1.018 (ref 1.005–1.030)
Squamous Epithelial / LPF: NONE SEEN
Urobilinogen, UA: 1 mg/dL (ref 0.0–1.0)
pH: 7 (ref 5.0–8.0)

## 2014-06-14 LAB — CYTOLOGY - PAP

## 2014-06-17 ENCOUNTER — Other Ambulatory Visit: Payer: Self-pay | Admitting: Gynecology

## 2014-06-17 DIAGNOSIS — N938 Other specified abnormal uterine and vaginal bleeding: Secondary | ICD-10-CM

## 2014-06-17 DIAGNOSIS — N939 Abnormal uterine and vaginal bleeding, unspecified: Secondary | ICD-10-CM

## 2014-06-19 ENCOUNTER — Ambulatory Visit (INDEPENDENT_AMBULATORY_CARE_PROVIDER_SITE_OTHER): Payer: BLUE CROSS/BLUE SHIELD | Admitting: Gynecology

## 2014-06-19 ENCOUNTER — Encounter: Payer: Self-pay | Admitting: Gynecology

## 2014-06-19 ENCOUNTER — Ambulatory Visit (INDEPENDENT_AMBULATORY_CARE_PROVIDER_SITE_OTHER): Payer: BLUE CROSS/BLUE SHIELD

## 2014-06-19 ENCOUNTER — Other Ambulatory Visit: Payer: Self-pay | Admitting: Gynecology

## 2014-06-19 VITALS — BP 116/76

## 2014-06-19 DIAGNOSIS — N938 Other specified abnormal uterine and vaginal bleeding: Secondary | ICD-10-CM

## 2014-06-19 DIAGNOSIS — D251 Intramural leiomyoma of uterus: Secondary | ICD-10-CM

## 2014-06-19 DIAGNOSIS — N939 Abnormal uterine and vaginal bleeding, unspecified: Secondary | ICD-10-CM

## 2014-06-19 DIAGNOSIS — N926 Irregular menstruation, unspecified: Secondary | ICD-10-CM

## 2014-06-19 LAB — PROLACTIN: Prolactin: 7.6 ng/mL

## 2014-06-19 LAB — FOLLICLE STIMULATING HORMONE: FSH: 7.1 m[IU]/mL

## 2014-06-19 NOTE — Progress Notes (Signed)
[name] 06-05-1965 [id number]        49 y.o.  Presents having seen [place] with a history of regular monthly menses lasting 5-6 days until approximately 4 months ago when she started bleeding in between her periods and after intercourse. No pain associated with this. No hot flashes night sweats we changed his hair or skin changes. Recent TSH was normal. Hemoglobin 13  Pap smear/HPV 06/13/2014 was negative but did note endometrial cells.  Past medical history,surgical history, problem list, medications, allergies, family history and social history were all reviewed and documented in the EPIC chart.  Directed ROS with pertinent positives and negatives documented in the history of present illness/assessment and plan.  Exam: [place] assistant Filed Vitals:   06/19/14 0859  BP: 116/76   General appearance:  Normal  abdomen soft nontender without masses guarding rebound Pelvic external BUS vagina normal. Cervix grossly normal. Uterus grossly normal size midline mobile nontender. Adnexa without masses or tenderness  Ultrasound shows uterus overall normal size with endometrial echo of 19 mm. 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 Right and left ovaries grossly normal. Cul-de-sac negative.  Sonohysterogram was performed, sterile technique, easy catheter introduction, good distention with no abnormalities. Thicker endometrium noted but no clear polyps. Endometrial sample taken. Patient tolerated well.  Assessment/Plan:  49 y.o. with the above history. Patient will follow up for biopsy results. TSH was negative. Check FSH and prolactin today. Various scenarios reviewed to include intermittent progesterone withdrawal versus hysteroscopy D&C. We'll further discuss after biopsy results and hormone level results.     Anastasio Auerbach MD, 9:16 AM 06/19/2014

## 2014-06-19 NOTE — Patient Instructions (Signed)
Office will call you with biopsy results 

## 2014-06-21 ENCOUNTER — Other Ambulatory Visit: Payer: Self-pay | Admitting: Gynecology

## 2014-06-21 DIAGNOSIS — N926 Irregular menstruation, unspecified: Secondary | ICD-10-CM

## 2014-06-21 MED ORDER — MEDROXYPROGESTERONE ACETATE 10 MG PO TABS: ORAL_TABLET | ORAL | Status: DC

## 2014-07-10 ENCOUNTER — Telehealth: Payer: Self-pay | Admitting: *Deleted

## 2014-07-10 NOTE — Telephone Encounter (Signed)
Pt started Provera 10 mg for 14 days of month x 3 months as directed her first dose was on 06/20/14 completed 14 days and started bleeding on 07/06/14. Pt said Rx was to help shed her lining, but the bleeding is heavy changing pads every hour to an hour and a half. She asked if this is a normal bled you want her to have? Please advise

## 2014-07-10 NOTE — Telephone Encounter (Signed)
Pt informed with the below note, will follow up tomorrow

## 2014-07-10 NOTE — Telephone Encounter (Signed)
She did have a thicker lining on the ultrasound and this first withdrawal bleed may be heavier because of that. Hopefully when she does this for the next 3 months the subsequent bleeds will not be as heavy as she has shed off most of the lining this time. See what she does today. If her heavy bleeding continues then I can give her something to slow it down but hopefully over today's time it will take care of itself. Call if it continues heavy through tomorrow.

## 2014-09-01 ENCOUNTER — Emergency Department (HOSPITAL_COMMUNITY): Payer: BLUE CROSS/BLUE SHIELD

## 2014-09-01 ENCOUNTER — Encounter (HOSPITAL_COMMUNITY): Payer: Self-pay | Admitting: Physical Medicine and Rehabilitation

## 2014-09-01 ENCOUNTER — Emergency Department (HOSPITAL_COMMUNITY)
Admission: EM | Admit: 2014-09-01 | Discharge: 2014-09-01 | Disposition: A | Discharge status: Home / Self Care | Payer: BLUE CROSS/BLUE SHIELD | Attending: Emergency Medicine | Admitting: Emergency Medicine

## 2014-09-01 DIAGNOSIS — Z87440 Personal history of urinary (tract) infections: Secondary | ICD-10-CM | POA: Diagnosis not present

## 2014-09-01 DIAGNOSIS — Q7649 Other congenital malformations of spine, not associated with scoliosis: Secondary | ICD-10-CM | POA: Insufficient documentation

## 2014-09-01 DIAGNOSIS — M545 Low back pain, unspecified: Secondary | ICD-10-CM

## 2014-09-01 DIAGNOSIS — Z79899 Other long term (current) drug therapy: Secondary | ICD-10-CM | POA: Insufficient documentation

## 2014-09-01 DIAGNOSIS — M419 Scoliosis, unspecified: Secondary | ICD-10-CM | POA: Diagnosis not present

## 2014-09-01 DIAGNOSIS — K219 Gastro-esophageal reflux disease without esophagitis: Secondary | ICD-10-CM | POA: Diagnosis not present

## 2014-09-01 DIAGNOSIS — N200 Calculus of kidney: Secondary | ICD-10-CM | POA: Insufficient documentation

## 2014-09-01 DIAGNOSIS — G43109 Migraine with aura, not intractable, without status migrainosus: Secondary | ICD-10-CM | POA: Diagnosis not present

## 2014-09-01 DIAGNOSIS — M5442 Lumbago with sciatica, left side: Secondary | ICD-10-CM | POA: Diagnosis not present

## 2014-09-01 MED ORDER — NAPROXEN 500 MG PO TABS: 500.0000 mg | ORAL_TABLET | Freq: Two times a day (BID) | ORAL | Status: DC | PRN

## 2014-09-01 MED ORDER — HYDROCODONE-ACETAMINOPHEN 5-325 MG PO TABS
1.0000 | ORAL_TABLET | Freq: Once | ORAL | Status: AC
  Administered 2014-09-01: 1 via ORAL
  Filled 2014-09-01: qty 1

## 2014-09-01 MED ORDER — PROMETHAZINE HCL 25 MG PO TABS
25.0000 mg | ORAL_TABLET | Freq: Once | ORAL | Status: AC
  Administered 2014-09-01: 25 mg via ORAL
  Filled 2014-09-01: qty 1

## 2014-09-01 MED ORDER — CYCLOBENZAPRINE HCL 10 MG PO TABS: 10.0000 mg | ORAL_TABLET | Freq: Three times a day (TID) | ORAL | Status: DC | PRN

## 2014-09-01 MED ORDER — HYDROCODONE-ACETAMINOPHEN 5-325 MG PO TABS: 1.0000 | ORAL_TABLET | Freq: Four times a day (QID) | ORAL | Status: DC | PRN

## 2014-09-01 MED ORDER — PREDNISONE 20 MG PO TABS: ORAL_TABLET | ORAL | Status: DC

## 2014-09-01 NOTE — ED Provider Notes (Signed)
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 This patient was seen in room TR07C/TR07C and the patient's care was started at 7:12 PM.  Chief Complaint  Patient presents with  . Back Pain   Patient is a 49 y.o. female presenting with back pain. The history is provided by the patient. No language interpreter was used.  Back Pain Location:  Lumbar spine Quality:  Aching Radiates to:  L thigh Pain severity:  Moderate Pain is:  Same all the time Onset quality:  Gradual Duration:  3 months Timing:  Constant Progression:  Worsening Chronicity:  New Context: lifting heavy objects   Relieved by:  Muscle relaxants Worsened by:  Ambulation and movement Ineffective treatments:  Ibuprofen Associated symptoms: no abdominal pain, no bladder incontinence, no bowel incontinence, no chest pain, no dysuria, no fever, no headaches, no numbness, no paresthesias, no pelvic pain, no perianal numbness, no tingling and no weakness   Risk factors: no hx of cancer    HPI Comments: [name] is a 49 y.o. female with a PMHx of GERD and chronic migraines, who presents to the Emergency Department complaining of constant moderate lower back pain that started 3 months ago. She states that she has been having lower back pain radiating in her L thigh for 3 months, but worsened today so she came to the ED. She reports that she does frequent heavy lifting with her job, but cannot recall a specific injury with twisting or MVC. She currently rates the severity of the pain as a 10/10, and states it's constant but waxes/wanes in severity. She describes the pain as a constant throbbing, achy sensation with intermittent sharp sensations. She reports that she took ibuprofen and midol without any relief. States that flexeril from her mother helped somewhat. She states  that ambulation and movement exacerbates the pain. She reports that she has no hx of cancer or IV drug use. During her wait in the ER, she developed some nausea which she states is a chronic issue due to her GERD. She denies any fevers, chills, chest pain, SOB, abdominal pain, vomiting, diarrhea, constipation, hematuria, dysuria, melena, hematochezia, vaginal discharge, bowel or bladder incontinence, cauda equina symptoms, numbness, tingling, weakness, vision changes, or headache. Currently on her menses. Has no hx of HTN but states it goes up when she's in pain.    Past Medical History  Diagnosis Date  . Migraine with visual aura   . UTI (lower urinary tract infection)   . GERD (gastroesophageal reflux disease)   . Celiac disease    Past Surgical History  Procedure Laterality Date  . Tubal ligation    . Exploratory laparotomy    . Bladder suspension     Family History  Problem Relation Age of Onset  . Diabetes Son 12    type 1   History  Substance Use Topics  . Smoking status: Never Smoker   . Smokeless tobacco: Never Used  . Alcohol Use: No   OB History    No data available     Review of Systems  Constitutional: Negative for fever and chills.  Eyes: Negative for visual disturbance.  Respiratory: Negative for shortness of breath.   Cardiovascular: Negative for chest pain.  Gastrointestinal: Positive for nausea (mild). Negative for vomiting, abdominal pain, diarrhea, constipation, blood in stool and bowel incontinence.  Genitourinary: Negative  for bladder incontinence, dysuria, hematuria, flank pain, vaginal bleeding, vaginal discharge and pelvic pain.  Musculoskeletal: Positive for back pain. Negative for gait problem and neck pain.  Skin: Negative for color change.  Allergic/Immunologic: Negative for immunocompromised state.  Neurological: Negative for tingling, weakness, numbness, headaches and paresthesias.  10 Systems reviewed and all are negative for acute change  except as noted in the HPI.  Allergies  Gluten meal and Ondansetron  Home Medications   Prior to Admission medications   Medication Sig Start Date End Date Taking? Authorizing Provider  aspirin-acetaminophen-caffeine (EXCEDRIN MIGRAINE) [phone] MG per tablet Take 2 tablets by mouth every 6 (six) hours as needed for pain.     Historical Provider, MD  ibuprofen (ADVIL,MOTRIN) 200 MG tablet Take 800 mg by mouth every 6 (six) hours as needed (pain).    Historical Provider, MD  medroxyPROGESTERone (PROVERA) 10 MG tablet Take one tab daily D1-14 of each mos x 3 mos. 06/21/14   Anastasio Auerbach, MD  promethazine (PHENERGAN) 25 MG tablet Take 25 mg by mouth every 6 (six) hours as needed for nausea or vomiting.    Historical Provider, MD  ranitidine (ZANTAC) 150 MG tablet Take 1 tablet (150 mg total) by mouth 2 (two) times daily. 05/22/14   Robyn Haber, MD   BP 155/103 mmHg  Pulse 97  Temp(Src) 98 F (36.7 C) (Oral)  Resp 18  Wt 157 lb 4.8 oz (71.351 kg)  SpO2 98% Physical Exam  Constitutional: She is oriented to person, place, and time. She appears well-developed and well-nourished.  Non-toxic appearance. No distress.  Afebrile, nontoxic, NAD, mild HTN noted but otherwise VSS  HENT:  Head: Normocephalic and atraumatic.  Mouth/Throat: Oropharynx is clear and moist and mucous membranes are normal.  Eyes: Conjunctivae and EOM are normal. Right eye exhibits no discharge. Left eye exhibits no discharge.  Neck: Normal range of motion. Neck supple. No spinous process tenderness and no muscular tenderness present. No rigidity. Normal range of motion present.  Cardiovascular: Normal rate, regular rhythm, normal heart sounds and intact distal pulses.  Exam reveals no gallop and no friction rub.   No murmur heard. Pulmonary/Chest: Effort normal and breath sounds normal. No respiratory distress. She has no decreased breath sounds. She has no wheezes. She has no rhonchi. She has no rales.   Abdominal: Soft. Normal appearance and bowel sounds are normal. She exhibits no distension. There is no tenderness. There is no rigidity, no rebound, no guarding, no CVA tenderness, no tenderness at McBurney's point and negative Murphy's sign.  Soft, NTND, +BS throughout, no r/g/r, neg murphy's, neg mcburney's, no CVA TTP   Musculoskeletal: Normal range of motion.       Lumbar back: She exhibits tenderness, bony tenderness and spasm. She exhibits normal range of motion and no deformity.       Back:  Lumbar spine with FROM intact with mild midline spinous process TTP around L4-5, no bony stepoffs or deformities, mild b/l paraspinous muscle TTP with slight muscle spasms. Strength 5/5 in all extremities, sensation grossly intact in all extremities, +L sided SLR, neg R sided SLR, gait steady and nonantalgic. No overlying skin changes.   Neurological: She is alert and oriented to person, place, and time. She has normal strength. No sensory deficit. Gait normal.  Skin: Skin is warm, dry and intact. No rash noted.  Psychiatric: She has a normal mood and affect. Her behavior is normal.  Nursing note and vitals reviewed.   ED Course  Procedures (  including critical care time) DIAGNOSTIC STUDIES: Oxygen Saturation is 98% on RA, normal by my interpretation.    COORDINATION OF CARE: 7:16 PM- Pt advised of plan for treatment which includes medication and pt agrees.  Labs Review Labs Reviewed - No data to display  Imaging Review Dg Lumbar Spine Complete  09/01/2014   CLINICAL DATA:  Lumbar back pain. Pain extends down left leg intermittently for the past 3 months.  EXAM: LUMBAR SPINE - COMPLETE 4+ VIEW  COMPARISON:  07/30/2011  FINDINGS: There is 11 degrees of dextroconvex lumbar scoliosis between L1 and L5.  Small calculi project over the lower poles of both kidneys.  Limbus vertebra at L4. No significant malalignment. Mild spurring anterior to the vertebral body column along the lower thoracic spine.  No significant malalignment.  IMPRESSION: 1. Mild dextroconvex lumbar scoliosis. No significant subluxation or fracture. 2. Small calculi project over the lower poles of both kidneys.   Electronically Signed   By: Van Clines M.D.   On: 09/01/2014 20:27     EKG Interpretation None      MDM   Final diagnoses:  Lumbar back pain  Midline low back pain with left-sided sciatica  Scoliosis  Anomaly of vertebrae  Nephrolithiasis    49 y.o. female here with low back pain radiating down L leg x3 months. Does a lot of heavy lifting at work. Worsened yesterday and today. On exam, some midline tenderness, extremities neurovascularly intact, and steady gait, but given midline tenderness will obtain xray. Doubt need for emergent MRI but discussed that it could be a disc pathology and could warrant MRI as outpt. Want to eval for degenerative changes or occult fx with xray today. Will give pain meds. Of note, pt states she's developed nausea in the ER while waiting, states this is a chronic issue due to her reflux, and has been using NSAIDs quite a bit recently. No abdominal tenderness, tolerating PO well here. Will give phenergan now. Pt noted to have mild HTN here, could be pain related, no s/sx of HTN therefore doubt further work up needs to be done. Will reassess after xray.   8:58 PM Pain improved and VS improved. Xray showing Limbus vertebrae over her area of pain which increases suspicion for bulging disc; also shows scoliosis, and b/l renal stones in lower pole, doubt this is causing her pain. Given no cauda equina symptoms, doubt need for emergent MRI imaging. Patient was counseled on back pain precautions and told to do activity as tolerated but do not lift, push, or pull heavy objects more than 10 pounds for the next week. Patient counseled to use ice or heat on back for no longer than 15 minutes every hour. Rx given for muscle relaxer and counseled on proper use of muscle relaxant medication.  Rx given for narcotic pain medicine and counseled on proper use of narcotic pain medications. Told that they can increase to every 4 hrs if needed while pain is worse. Counseled not to combine this medication with others containing tylenol. Urged patient not to drink alcohol, drive, or perform any other activities that requires focus while taking either of these medications. Patient urged to follow-up with ortho in 1wk. Urged to return with worsening severe pain, loss of bowel or bladder control, trouble walking. The patient verbalizes understanding and agrees with the plan.   I personally performed the services described in this documentation, which was scribed in my presence. The recorded information has been reviewed and is accurate.  BP  137/90 mmHg  Pulse 71  Temp(Src) 98 F (36.7 C) (Oral)  Resp 16  Wt 157 lb 4.8 oz (71.351 kg)  SpO2 100%  LMP 08/31/2014  Meds ordered this encounter  Medications  . promethazine (PHENERGAN) tablet 25 mg    Sig:   . HYDROcodone-acetaminophen (NORCO/VICODIN) 5-325 MG per tablet 1 tablet    Sig:   . predniSONE (DELTASONE) 20 MG tablet    Sig: 3 tabs po daily x 4 days    Dispense:  12 tablet    Refill:  0    Order Specific Question:  Supervising Provider    Answer:  MILLER, BRIAN [[id number]]  . HYDROcodone-acetaminophen (NORCO) 5-325 MG per tablet    Sig: Take 1 tablet by mouth every 6 (six) hours as needed for severe pain.    Dispense:  20 tablet    Refill:  0    Order Specific Question:  Supervising Provider    Answer:  MILLER, BRIAN [[id number]]  . naproxen (NAPROSYN) 500 MG tablet    Sig: Take 1 tablet (500 mg total) by mouth 2 (two) times daily as needed for mild pain, moderate pain or headache (TAKE WITH MEALS.).    Dispense:  20 tablet    Refill:  0    Order Specific Question:  Supervising Provider    Answer:  MILLER, BRIAN [[id number]]  . cyclobenzaprine (FLEXERIL) 10 MG tablet    Sig: Take 1 tablet (10 mg total) by mouth 3 (three) times daily as needed  for muscle spasms.    Dispense:  30 tablet    Refill:  0    Order Specific Question:  Supervising Provider    Answer:  Noemi Chapel [[id number]]     Ariel Boling Camprubi-Soms, [name] 09/01/14 2100  Ariel Essex, MD 09/02/14 3149

## 2014-09-01 NOTE — Discharge Instructions (Signed)
Back Pain:  Your back pain should be treated with medicines such as ibuprofen or aleve and this back pain should get better over the next 2 weeks.  However if you develop severe or worsening pain, low back pain with fever, numbness, weakness or inability to walk or urinate, you should return to the ER immediately.  Please follow up with your doctor this week for a recheck if still having symptoms. NO HEAVY LIFTING over 15 pounds.  Low back pain is discomfort in the lower back that may be due to injuries to muscles and ligaments around the spine.  Occasionally, it may be caused by a a problem to a part of the spine called a disc, which may be what's causing your pain.  The pain may last several days or a week;  However, most patients get completely well in 4 weeks.  Self - care:  The application of heat can help soothe the pain.  Maintaining your daily activities, including walking, is encourged, as it will help you get better faster than just staying in bed. Perform gentle stretching as discussed. Drink plenty of fluids.  Medications are also useful to help with pain control.  A commonly prescribed medication includes norco.  Do not drive or operate heavy machinery while taking this medication.  Non steroidal anti inflammatory medications including Ibuprofen and naproxen;  These medications help both pain and swelling and are very useful in treating back pain.  They should be taken with food, as they can cause stomach upset, and more seriously, stomach bleeding.    Muscle relaxants:  These medications can help with muscle tightness that is a cause of lower back pain.  Most of these medications can cause drowsiness, and it is not safe to drive or use dangerous machinery while taking them.  Prednisone: take this to help with the pain in your leg.  SEEK IMMEDIATE MEDICAL ATTENTION IF: New numbness, tingling, weakness, or problem with the use of your arms or legs.  Severe back pain not relieved with  medications.  Difficulty with or loss of control of your bowel or bladder control.  Increasing pain in any areas of the body (such as chest or abdominal pain).  Shortness of breath, dizziness or fainting.  Nausea (feeling sick to your stomach), vomiting, fever, or sweats.  You will need to follow up with the orthopedist listed above in 1-2 weeks for ongoing management of your back pain.   Back Pain, Adult Back pain is very common. The pain often gets better over time. The cause of back pain is usually not dangerous. Most people can learn to manage their back pain on their own.  HOME CARE   Stay active. Start with short walks on flat ground if you can. Try to walk farther each day.  Do not sit, drive, or stand in one place for more than 30 minutes. Do not stay in bed.  Do not avoid exercise or work. Activity can help your back heal faster.  Be careful when you bend or lift an object. Bend at your knees, keep the object close to you, and do not twist.  Sleep on a firm mattress. Lie on your side, and bend your knees. If you lie on your back, put a pillow under your knees.  Only take medicines as told by your doctor.  Put ice on the injured area.  Put ice in a plastic bag.  Place a towel between your skin and the bag.  Leave the ice  on for 15-20 minutes, 03-04 times a day for the first 2 to 3 days. After that, you can switch between ice and heat packs.  Ask your doctor about back exercises or massage.  Avoid feeling anxious or stressed. Find good ways to deal with stress, such as exercise. GET HELP RIGHT AWAY IF:   Your pain does not go away with rest or medicine.  Your pain does not go away in 1 week.  You have new problems.  You do not feel well.  The pain spreads into your legs.  You cannot control when you poop (bowel movement) or pee (urinate).  Your arms or legs feel weak or lose feeling (numbness).  You feel sick to your stomach (nauseous) or throw up  (vomit).  You have belly (abdominal) pain.  You feel like you may pass out (faint). MAKE SURE YOU:   Understand these instructions.  Will watch your condition.  Will get help right away if you are not doing well or get worse. Document Released: 09/15/2007 Document Revised: 06/21/2011 Document Reviewed: 07/31/2013 [facility] Patient Information 2015 [place], [place]. This information is not intended to replace advice given to you by your health care provider. Make sure you discuss any questions you have with your health care provider.  Back Injury Prevention The following tips can help you to prevent a back injury. PHYSICAL FITNESS  Exercise often. Try to develop strong stomach (abdominal) muscles.  Do aerobic exercises often. This includes walking, jogging, biking, swimming.  Do exercises that help with balance and strength often. This includes tai chi and yoga.  Stretch before and after you exercise.  Keep a healthy weight. DIET   Ask your doctor how much calcium and vitamin D you need every day.  Include calcium in your diet. Foods high in calcium include dairy products; green, leafy vegetables; and products with calcium added (fortified).  Include vitamin D in your diet. Foods high in vitamin D include milk and products with vitamin D added.  Think about taking a multivitamin or other nutritional products called " supplements."  Stop smoking if you smoke. POSTURE   Sit and stand up straight. Avoid leaning forward or hunching over.  Choose chairs that support your lower back.  If you work at a desk:  Sit close to your work so you do not lean over.  Keep your chin tucked in.  Keep your neck drawn back.  Keep your elbows bent at a right angle. Your arms should look like the letter "L."  Sit high and close to the steering wheel when you drive. Add low back support to your car seat if needed.  Avoid sitting or standing in one position for too long. Get up and move  around every hour. Take breaks if you are driving for a long time.  Sleep on your side with your knees slightly bent. You can also sleep on your back with a pillow under your knees. Do not sleep on your stomach. LIFTING, TWISTING, AND REACHING  Avoid heavy lifting, especially lifting over and over again. If you must do heavy lifting:  Stretch before lifting.  Work slowly.  Rest between lifts.  Use carts and dollies to move objects when possible.  Make several small trips instead of carrying 1 heavy load.  Ask for help when you need it.  Ask for help when moving big, awkward objects.  Follow these steps when lifting:  Stand with your feet shoulder-width apart.  Get as close to the object  as you can. Do not pick up heavy objects that are far from your body.  Use handles or lifting straps when possible.  Bend at your knees. Squat down, but keep your heels off the floor.  Keep your shoulders back, your chin tucked in, and your back straight.  Lift the object slowly. Tighten the muscles in your legs, stomach, and butt. Keep the object as close to the center of your body as possible.  Reverse these directions when you put a load down.  Do not:  Lift the object above your waist.  Twist at the waist while lifting or carrying a load. Move your feet if you need to turn, not your waist.  Bend over without bending at your knees.  Avoid reaching over your head, across a table, or for an object on a high surface. OTHER TIPS  Avoid wet floors and keep sidewalks clear of ice.  Do not sleep on a mattress that is too soft or too hard.  Keep items that you use often within easy reach.  Put heavier objects on shelves at waist level. Put lighter objects on lower or higher shelves.  Find ways to lessen your stress. You can try exercise, massage, or relaxation.  Get help for depression or anxiety if needed. GET HELP IF:  You injure your back.  You have questions about diet,  exercise, or other ways to prevent back injuries. MAKE SURE YOU:  Understand these instructions.  Will watch your condition.  Will get help right away if you are not doing well or get worse. Document Released: 09/15/2007 Document Revised: 06/21/2011 Document Reviewed: 05/10/2011 [facility] Patient Information 2015 [place], [place]. This information is not intended to replace advice given to you by your health care provider. Make sure you discuss any questions you have with your health care provider.  Back Exercises Back exercises help treat and prevent back injuries. The goal is to increase your strength in your belly (abdominal) and back muscles. These exercises can also help with flexibility. Start these exercises when told by your doctor. HOME CARE Back exercises include: Pelvic Tilt.  Lie on your back with your knees bent. Tilt your pelvis until the lower part of your back is against the floor. Hold this position 5 to 10 sec. Repeat this exercise 5 to 10 times. Knee to Chest.  Pull 1 knee up against your chest and hold for 20 to 30 seconds. Repeat this with the other knee. This may be done with the other leg straight or bent, whichever feels better. Then, pull both knees up against your chest. Sit-Ups or Curl-Ups.  Bend your knees 90 degrees. Start with tilting your pelvis, and do a partial, slow sit-up. Only lift your upper half 30 to 45 degrees off the floor. Take at least 2 to 3 seonds for each sit-up. Do not do sit-ups with your knees out straight. If partial sit-ups are difficult, simply do the above but with only tightening your belly (abdominal) muscles and holding it as told. Hip-Lift.  Lie on your back with your knees flexed 90 degrees. Push down with your feet and shoulders as you raise your hips 2 inches off the floor. Hold for 10 seconds, repeat 5 to 10 times. Back Arches.  Lie on your stomach. Prop yourself up on bent elbows. Slowly press on your hands, causing an arch in  your low back. Repeat 3 to 5 times. Shoulder-Lifts.  Lie face down with arms beside your body. Keep hips and belly  pressed to floor as you slowly lift your head and shoulders off the floor. Do not overdo your exercises. Be careful in the beginning. Exercises may cause you some mild back discomfort. If the pain lasts for more than 15 minutes, stop the exercises until you see your doctor. Improvement with exercise for back problems is slow.  Document Released: 05/01/2010 Document Revised: 06/21/2011 Document Reviewed: 01/28/2011 [facility] Patient Information 2015 [place], [place]. This information is not intended to replace advice given to you by your health care provider. Make sure you discuss any questions you have with your health care provider.  Heat Therapy Heat therapy can help make painful, stiff muscles and joints feel better. Do not use heat on new injuries. Wait at least 48 hours after an injury to use heat. Do not use heat when you have aches or pains right after an activity. If you still have pain 3 hours after stopping the activity, then you may use heat. HOME CARE Wet heat pack  Soak a clean towel in warm water. Squeeze out the extra water.  Put the warm, wet towel in a plastic bag.  Place a thin, dry towel between your skin and the bag.  Put the heat pack on the area for 5 minutes, and check your skin. Your skin may be pink, but it should not be red.  Leave the heat pack on the area for 15 to 30 minutes.  Repeat this every 2 to 4 hours while awake. Do not use heat while you are sleeping. Warm water bath  Fill a tub with warm water.  Place the affected body part in the tub.  Soak the area for 20 to 40 minutes.  Repeat as needed. Hot water bottle  Fill the water bottle half full with hot water.  Press out the extra air. Close the cap tightly.  Place a dry towel between your skin and the bottle.  Put the bottle on the area for 5 minutes, and check your skin. Your skin  may be pink, but it should not be red.  Leave the bottle on the area for 15 to 30 minutes.  Repeat this every 2 to 4 hours while awake. Electric heating pad  Place a dry towel between your skin and the heating pad.  Set the heating pad on low heat.  Put the heating pad on the area for 10 minutes, and check your skin. Your skin may be pink, but it should not be red.  Leave the heating pad on the area for 20 to 40 minutes.  Repeat this every 2 to 4 hours while awake.  Do not lie on the heating pad.  Do not fall asleep while using the heating pad.  Do not use the heating pad near water. GET HELP RIGHT AWAY IF:  You get blisters or red skin.  Your skin is puffy (swollen), or you lose feeling (numbness) in the affected area.  You have any new problems.  Your problems are getting worse.  You have any questions or concerns. If you have any problems, stop using heat therapy until you see your doctor. MAKE SURE YOU:  Understand these instructions.  Will watch your condition.  Will get help right away if you are not doing well or get worse. Document Released: 06/21/2011 Document Reviewed: 05/22/2013 [facility] Patient Information 2015 [place]. This information is not intended to replace advice given to you by your health care provider. Make sure you discuss any questions you have with your  health care provider.

## 2014-09-01 NOTE — ED Notes (Signed)
Pt reports lower back pain radiating to L leg for several months. States pain increased today. 8/10 pain upon arrival. Pt is alert and oriented x4. NAD.

## 2014-09-20 ENCOUNTER — Emergency Department (HOSPITAL_COMMUNITY): Payer: BLUE CROSS/BLUE SHIELD

## 2014-09-20 ENCOUNTER — Emergency Department (HOSPITAL_COMMUNITY)
Admission: EM | Admit: 2014-09-20 | Discharge: 2014-09-21 | Disposition: A | Discharge status: Home / Self Care | Payer: BLUE CROSS/BLUE SHIELD | Attending: Emergency Medicine | Admitting: Emergency Medicine

## 2014-09-20 ENCOUNTER — Other Ambulatory Visit: Payer: Self-pay | Admitting: Gynecology

## 2014-09-20 ENCOUNTER — Encounter (HOSPITAL_COMMUNITY): Payer: Self-pay | Admitting: Emergency Medicine

## 2014-09-20 DIAGNOSIS — Z79899 Other long term (current) drug therapy: Secondary | ICD-10-CM | POA: Diagnosis not present

## 2014-09-20 DIAGNOSIS — R42 Dizziness and giddiness: Secondary | ICD-10-CM

## 2014-09-20 DIAGNOSIS — G43909 Migraine, unspecified, not intractable, without status migrainosus: Secondary | ICD-10-CM | POA: Diagnosis not present

## 2014-09-20 DIAGNOSIS — Z87440 Personal history of urinary (tract) infections: Secondary | ICD-10-CM | POA: Diagnosis not present

## 2014-09-20 DIAGNOSIS — N930 Postcoital and contact bleeding: Secondary | ICD-10-CM

## 2014-09-20 DIAGNOSIS — K219 Gastro-esophageal reflux disease without esophagitis: Secondary | ICD-10-CM | POA: Insufficient documentation

## 2014-09-20 DIAGNOSIS — Z8669 Personal history of other diseases of the nervous system and sense organs: Secondary | ICD-10-CM | POA: Diagnosis not present

## 2014-09-20 DIAGNOSIS — R030 Elevated blood-pressure reading, without diagnosis of hypertension: Secondary | ICD-10-CM | POA: Diagnosis present

## 2014-09-20 LAB — CBC WITH DIFFERENTIAL/PLATELET
Basophils Absolute: 0 10*3/uL (ref 0.0–0.1)
Basophils Relative: 0 % (ref 0–1)
Eosinophils Absolute: 0.3 10*3/uL (ref 0.0–0.7)
Eosinophils Relative: 7 % — ABNORMAL HIGH (ref 0–5)
HCT: 38.3 % (ref 36.0–46.0)
Hemoglobin: 12.9 g/dL (ref 12.0–15.0)
Lymphocytes Relative: 21 % (ref 12–46)
Lymphs Abs: 1.1 10*3/uL (ref 0.7–4.0)
MCH: 30.4 pg (ref 26.0–34.0)
MCHC: 33.7 g/dL (ref 30.0–36.0)
MCV: 90.3 fL (ref 78.0–100.0)
Monocytes Absolute: 0.4 10*3/uL (ref 0.1–1.0)
Monocytes Relative: 7 % (ref 3–12)
Neutro Abs: 3.5 10*3/uL (ref 1.7–7.7)
Neutrophils Relative %: 65 % (ref 43–77)
Platelets: 251 10*3/uL (ref 150–400)
RBC: 4.24 MIL/uL (ref 3.87–5.11)
RDW: 14.2 % (ref 11.5–15.5)
WBC: 5.3 10*3/uL (ref 4.0–10.5)

## 2014-09-20 NOTE — ED Notes (Signed)
Pt. reports elevated blood pressure at [facility] this evening 153/100 , denies headache or nausea , pt. is not taking  antihypertensive medications .

## 2014-09-20 NOTE — ED Provider Notes (Signed)
CSN: [id number]     Arrival date & time 09/20/14  2126 History  This chart was scribed for Ariel Glazier, PA-C working with No att. providers found by Mercy Moore, ED Scribe. This patient was seen in room TR05C/TR05C and the patient's care was started at 10:18 PM.   Chief Complaint  Patient presents with  . Hypertension   The history is provided by the patient. No language interpreter was used.   HPI Comments: [name] is a 49 y.o. female who presents to the Emergency Department reporting elevated blood pressure measured [facility] today. Patient without prior history/diagnosis of Hypertension. Patient reports headache and left eye redness which presented five days ago; patient states she did not find this concerning because she typically has migraines. Patient states that her associated headache is consistent with typical migraine which is relieved with OTC ibuprofen and/or Excedrin. Patient reports blood pressure measured at 136/91 this morning. Later in the day patient reports feeling lightheaded, dizzy with blurred vision. [facility]; her blood pressure measured at 153/91 with a pulse of 94. One hour later patient's blood pressure measured at 153/100 with pulse of 108. Currently patient reports that she is no longer experiencing lightheadedness or room spinning sensation, but reports that she still feels dazed. Patient is not a smoker.   Past Medical History  Diagnosis Date  . Migraine with visual aura   . UTI (lower urinary tract infection)   . GERD (gastroesophageal reflux disease)   . Celiac disease    Past Surgical History  Procedure Laterality Date  . Tubal ligation    . Exploratory laparotomy    . Bladder suspension     Family History  Problem Relation Age of Onset  . Diabetes Son 12    type 1   History  Substance Use Topics  . Smoking status: Never Smoker   . Smokeless tobacco: Never Used  . Alcohol Use: No   OB History    No data available      Review of Systems  Constitutional: Negative for fever and chills.  HENT: Negative for ear pain and sore throat.   Respiratory: Negative for cough.   Cardiovascular: Negative for chest pain.  Gastrointestinal: Negative for abdominal pain, diarrhea and constipation.  Genitourinary: Negative for dysuria, hematuria and difficulty urinating.  Neurological: Positive for dizziness, light-headedness and headaches. Negative for syncope, facial asymmetry, speech difficulty, weakness and numbness.  All other systems reviewed and are negative.     Allergies  Gluten meal and Ondansetron  Home Medications   Prior to Admission medications   Medication Sig Start Date End Date Taking? Authorizing Provider  aspirin-acetaminophen-caffeine (EXCEDRIN MIGRAINE) [phone] MG per tablet Take 2 tablets by mouth every 6 (six) hours as needed for pain.     Historical Provider, MD  cyclobenzaprine (FLEXERIL) 10 MG tablet Take 1 tablet (10 mg total) by mouth 3 (three) times daily as needed for muscle spasms. 09/01/14   Mercedes Camprubi-Soms, PA-C  HYDROcodone-acetaminophen (NORCO) 5-325 MG per tablet Take 1 tablet by mouth every 6 (six) hours as needed for severe pain. 09/01/14   Mercedes Camprubi-Soms, PA-C  ibuprofen (ADVIL,MOTRIN) 200 MG tablet Take 800 mg by mouth every 6 (six) hours as needed (pain).    Historical Provider, MD  medroxyPROGESTERone (PROVERA) 10 MG tablet Take one tab daily D1-14 of each mos x 3 mos. 06/21/14   Anastasio Auerbach, MD  naproxen (NAPROSYN) 500 MG tablet Take 1 tablet (500 mg total) by mouth  2 (two) times daily as needed for mild pain, moderate pain or headache (TAKE WITH MEALS.). 09/01/14   Mercedes Camprubi-Soms, PA-C  predniSONE (DELTASONE) 20 MG tablet 3 tabs po daily x 4 days 09/01/14   Mercedes Camprubi-Soms, PA-C  promethazine (PHENERGAN) 25 MG tablet Take 25 mg by mouth every 6 (six) hours as needed for nausea or vomiting.    Historical Provider, MD  ranitidine (ZANTAC) 150  MG tablet Take 1 tablet (150 mg total) by mouth 2 (two) times daily. 05/22/14   Robyn Haber, MD   Triage Vitals: BP 158/94 mmHg  Pulse 94  Temp(Src) 98.8 F (37.1 C) (Oral)  Resp 16  SpO2 100%  LMP 09/13/2014 Physical Exam  Constitutional: She is oriented to person, place, and time. She appears well-developed and well-nourished. No distress.  Well appearing non toxic.   HENT:  Head: Normocephalic and atraumatic.  Eyes: Conjunctivae and EOM are normal.  Neck: Neck supple.  Cardiovascular: Normal rate, regular rhythm and normal heart sounds.   Good radial pulses.   Pulmonary/Chest: Effort normal and breath sounds normal. No respiratory distress. She has no wheezes. She has no rales.  Lungs clear to ascultation.   Abdominal: Soft. There is no tenderness.  Musculoskeletal: Normal range of motion. She exhibits no edema.  No leg swelling.   Neurological: She is alert and oriented to person, place, and time. She has normal strength. No cranial nerve deficit or sensory deficit. GCS eye subscore is 4. GCS verbal subscore is 5. GCS motor subscore is 6.  Cranial nerves III through XII intact.  Skin: Skin is warm and dry.  Psychiatric: She has a normal mood and affect. Her behavior is normal.  Nursing note and vitals reviewed.   ED Course  Procedures (including critical care time)  COORDINATION OF CARE: 10:30 PM- Discussed treatment plan with patient at bedside and patient agreed to plan.   Labs Review Labs Reviewed  BASIC METABOLIC PANEL - Abnormal; Notable for the following:    Calcium 8.6 (*)    All other components within normal limits  CBC WITH DIFFERENTIAL/PLATELET - Abnormal; Notable for the following:    Eosinophils Relative 7 (*)    All other components within normal limits    Imaging Review Ct Head Wo Contrast  09/20/2014   CLINICAL DATA:  High blood pressure, acute onset. Dizziness and migraine headaches. Initial encounter.  EXAM: CT HEAD WITHOUT CONTRAST  TECHNIQUE:  Contiguous axial images were obtained from the base of the skull through the vertex without intravenous contrast.  COMPARISON:  None.  FINDINGS: There is no evidence of acute infarction, mass lesion, or intra- or extra-axial hemorrhage on CT.  The posterior fossa, including the cerebellum, brainstem and fourth ventricle, is within normal limits. The third and lateral ventricles, and basal ganglia are unremarkable in appearance. The cerebral hemispheres are symmetric in appearance, with normal gray-white differentiation. No mass effect or midline shift is seen.  There is no evidence of fracture; visualized osseous structures are unremarkable in appearance. The visualized portions of the orbits are within normal limits. The paranasal sinuses and mastoid air cells are well-aerated. No significant soft tissue abnormalities are seen.  IMPRESSION: Unremarkable noncontrast CT of the head.   Electronically Signed   By: Garald Balding M.D.   On: 09/20/2014 23:00     EKG Interpretation   Date/Time:  Friday September 20 2014 22:36:57 EDT Ventricular Rate:  89 PR Interval:  160 QRS Duration: 84 QT Interval:  384 QTC Calculation: 467  R Axis:   72 Text Interpretation:  Normal sinus rhythm Normal ECG Confirmed by Hazle Coca [phone]) on 09/20/2014 10:43:04 PM      MDM   Final diagnoses:  Dizziness  Patient had symptoms earlier today of dizziness, blurry vision, hypertension. Her symptoms have resolved including her headache prior to arrival in the ED. She is asymptomatic now so I did not take orthostatic vital signs. Her vitals have been stable throughout her visit. She states her daughter was the last person who saw her this morning and did not notice any facial droop or any difficulty speaking. She denies any numbness or tingling. I discussed that she should follow up with her primary care provider regarding her blood pressure. I did not treat her for hypertension since her last blood pressure reading was 137/84.  She is not tachycardic. Her EKG is normal sinus rhythm. I discussed her labs which are unremarkable and her CT which is negative for acute infarct, mass lesion or intracranial hemorrhage. Patient verbally agrees with the plan to follow up with her PCP.  I personally performed the services described in this documentation, which was scribed in my presence. The recorded information has been reviewed and is accurate.   Ariel Glazier, PA-C 09/21/14 0207  Quintella Reichert, MD 09/22/14 [phone]

## 2014-09-21 LAB — BASIC METABOLIC PANEL
Anion gap: 9 (ref 5–15)
BUN: 14 mg/dL (ref 6–20)
CO2: 22 mmol/L (ref 22–32)
Calcium: 8.6 mg/dL — ABNORMAL LOW (ref 8.9–10.3)
Chloride: 107 mmol/L (ref 101–111)
Creatinine, Ser: 0.68 mg/dL (ref 0.44–1.00)
GFR calc Af Amer: >60 mL/min (ref >60)
GFR calc non Af Amer: >60 mL/min (ref >60)
Glucose, Bld: 91 mg/dL (ref 65–99)
Potassium: 4.7 mmol/L (ref 3.5–5.1)
Sodium: 138 mmol/L (ref 135–145)

## 2014-09-21 NOTE — Discharge Instructions (Signed)

## 2014-10-09 ENCOUNTER — Emergency Department (HOSPITAL_COMMUNITY)
Admission: EM | Admit: 2014-10-09 | Discharge: 2014-10-09 | Disposition: A | Discharge status: Home / Self Care | Payer: BLUE CROSS/BLUE SHIELD | Attending: Emergency Medicine | Admitting: Emergency Medicine

## 2014-10-09 ENCOUNTER — Encounter (HOSPITAL_COMMUNITY): Payer: Self-pay | Admitting: *Deleted

## 2014-10-09 DIAGNOSIS — Z8719 Personal history of other diseases of the digestive system: Secondary | ICD-10-CM | POA: Insufficient documentation

## 2014-10-09 DIAGNOSIS — Z791 Long term (current) use of non-steroidal anti-inflammatories (NSAID): Secondary | ICD-10-CM | POA: Insufficient documentation

## 2014-10-09 DIAGNOSIS — G43109 Migraine with aura, not intractable, without status migrainosus: Secondary | ICD-10-CM | POA: Insufficient documentation

## 2014-10-09 DIAGNOSIS — N390 Urinary tract infection, site not specified: Secondary | ICD-10-CM | POA: Diagnosis not present

## 2014-10-09 DIAGNOSIS — G8929 Other chronic pain: Secondary | ICD-10-CM

## 2014-10-09 DIAGNOSIS — Z7982 Long term (current) use of aspirin: Secondary | ICD-10-CM | POA: Diagnosis not present

## 2014-10-09 DIAGNOSIS — M545 Low back pain: Secondary | ICD-10-CM | POA: Diagnosis present

## 2014-10-09 DIAGNOSIS — M549 Dorsalgia, unspecified: Secondary | ICD-10-CM

## 2014-10-09 LAB — CBC WITH DIFFERENTIAL/PLATELET
Basophils Absolute: 0 10*3/uL (ref 0.0–0.1)
Basophils Relative: 0 % (ref 0–1)
Eosinophils Absolute: 0 10*3/uL (ref 0.0–0.7)
Eosinophils Relative: 0 % (ref 0–5)
HCT: 39.7 % (ref 36.0–46.0)
Hemoglobin: 12.8 g/dL (ref 12.0–15.0)
Lymphocytes Relative: 10 % — ABNORMAL LOW (ref 12–46)
Lymphs Abs: 0.7 10*3/uL (ref 0.7–4.0)
MCH: 29.4 pg (ref 26.0–34.0)
MCHC: 32.2 g/dL (ref 30.0–36.0)
MCV: 91.1 fL (ref 78.0–100.0)
Monocytes Absolute: 0.5 10*3/uL (ref 0.1–1.0)
Monocytes Relative: 7 % (ref 3–12)
Neutro Abs: 5.8 10*3/uL (ref 1.7–7.7)
Neutrophils Relative %: 83 % — ABNORMAL HIGH (ref 43–77)
Platelets: 222 10*3/uL (ref 150–400)
RBC: 4.36 MIL/uL (ref 3.87–5.11)
RDW: 13.8 % (ref 11.5–15.5)
WBC: 7 10*3/uL (ref 4.0–10.5)

## 2014-10-09 LAB — COMPREHENSIVE METABOLIC PANEL
ALT: 19 U/L (ref 14–54)
AST: 24 U/L (ref 15–41)
Albumin: 3.7 g/dL (ref 3.5–5.0)
Alkaline Phosphatase: 81 U/L (ref 38–126)
Anion gap: 7 (ref 5–15)
BUN: 15 mg/dL (ref 6–20)
CO2: 26 mmol/L (ref 22–32)
Calcium: 8.8 mg/dL — ABNORMAL LOW (ref 8.9–10.3)
Chloride: 103 mmol/L (ref 101–111)
Creatinine, Ser: 0.81 mg/dL (ref 0.44–1.00)
GFR calc Af Amer: >60 mL/min (ref >60)
GFR calc non Af Amer: >60 mL/min (ref >60)
Glucose, Bld: 100 mg/dL — ABNORMAL HIGH (ref 65–99)
Potassium: 3.5 mmol/L (ref 3.5–5.1)
Sodium: 136 mmol/L (ref 135–145)
Total Bilirubin: 0.8 mg/dL (ref 0.3–1.2)
Total Protein: 7 g/dL (ref 6.5–8.1)

## 2014-10-09 LAB — URINE MICROSCOPIC-ADD ON

## 2014-10-09 LAB — LIPASE, BLOOD: Lipase: 18 U/L — ABNORMAL LOW (ref 22–51)

## 2014-10-09 LAB — URINALYSIS, ROUTINE W REFLEX MICROSCOPIC
Bilirubin Urine: NEGATIVE
Glucose, UA: NEGATIVE mg/dL
Hgb urine dipstick: NEGATIVE
Ketones, ur: 15 mg/dL — AB
Nitrite: NEGATIVE
Protein, ur: 100 mg/dL — AB
Specific Gravity, Urine: 1.022 (ref 1.005–1.030)
Urobilinogen, UA: 1 mg/dL (ref 0.0–1.0)
pH: 7 (ref 5.0–8.0)

## 2014-10-09 MED ORDER — CEPHALEXIN 500 MG PO CAPS: 1000.0000 mg | ORAL_CAPSULE | Freq: Two times a day (BID) | ORAL | Status: DC

## 2014-10-09 MED ORDER — MORPHINE SULFATE 4 MG/ML IJ SOLN
4.0000 mg | Freq: Once | INTRAMUSCULAR | Status: AC
  Administered 2014-10-09: 4 mg via INTRAVENOUS
  Filled 2014-10-09: qty 1

## 2014-10-09 MED ORDER — PROMETHAZINE HCL 25 MG/ML IJ SOLN
25.0000 mg | Freq: Once | INTRAMUSCULAR | Status: AC
  Administered 2014-10-09: 25 mg via INTRAVENOUS
  Filled 2014-10-09: qty 1

## 2014-10-09 MED ORDER — TRAMADOL HCL 50 MG PO TABS: 50.0000 mg | ORAL_TABLET | Freq: Four times a day (QID) | ORAL | Status: DC | PRN

## 2014-10-09 MED ORDER — CEFTRIAXONE SODIUM 1 G IJ SOLR
1.0000 g | Freq: Once | INTRAMUSCULAR | Status: AC
  Administered 2014-10-09: 1 g via INTRAVENOUS
  Filled 2014-10-09: qty 10

## 2014-10-09 NOTE — ED Notes (Signed)
Pt reports recent lower back pain and leg pain. Having n/v x 4 days. Denies diarrhea, having recent urinary symptoms.

## 2014-10-09 NOTE — ED Provider Notes (Signed)
CSN: [id number]     Arrival date & time 10/09/14  1612 History   First MD Initiated Contact with Patient 10/09/14 1834     Chief Complaint  Patient presents with  . Emesis  . Back Pain     (Consider location/radiation/quality/duration/timing/severity/associated sxs/prior Treatment) HPI   49 year old female with history of recurrent urinary tract infection, GERD, celiac disease who presents for evaluation of low back pain and dysuria. Patient admits that she has recurrent urinary tract infection. For nearly a month she has had increased urinary frequency, urgency, burning urination, strong urine odor and cloudy urine. Symptoms have been persistent. She also has history of low back pain with radicular pain which she was diagnosed with having sciatica. She has been having increasing low back pain radiates to her right leg consistence with her sciatica for the same duration. She has been seen and evaluated by orthopedist and had been referred for physical therapy which she has been going for several visits but unable to continue with treatment due to lack of funds. Patient states having back pain persist. For the past several days she also endorsed having nausea and vomiting, last vomited was yesterday. She denies any fever but endorsed chills. No chest pain, shortness of breath, bowel bladder incontinence, saddle anesthesia, or rash. No history of kidney stone.  Past Medical History  Diagnosis Date  . Migraine with visual aura   . UTI (lower urinary tract infection)   . GERD (gastroesophageal reflux disease)   . Celiac disease    Past Surgical History  Procedure Laterality Date  . Tubal ligation    . Exploratory laparotomy    . Bladder suspension     Family History  Problem Relation Age of Onset  . Diabetes Son 12    type 1   History  Substance Use Topics  . Smoking status: Never Smoker   . Smokeless tobacco: Never Used  . Alcohol Use: No   OB History    No data available      Review of Systems  All other systems reviewed and are negative.     Allergies  Gluten meal and Ondansetron  Home Medications   Prior to Admission medications   Medication Sig Start Date End Date Taking? Authorizing Provider  aspirin-acetaminophen-caffeine (EXCEDRIN MIGRAINE) [phone] MG per tablet Take 2 tablets by mouth every 6 (six) hours as needed for pain.    Yes Historical Provider, MD  cyclobenzaprine (FLEXERIL) 10 MG tablet Take 1 tablet (10 mg total) by mouth 3 (three) times daily as needed for muscle spasms. 09/01/14  Yes Mercedes Camprubi-Soms, PA-C  ibuprofen (ADVIL,MOTRIN) 200 MG tablet Take 800 mg by mouth every 6 (six) hours as needed (pain).   Yes Historical Provider, MD  naproxen (NAPROSYN) 500 MG tablet Take 500 mg by mouth 2 (two) times daily. 09/11/14  Yes Historical Provider, MD  promethazine (PHENERGAN) 25 MG tablet Take 25 mg by mouth every 6 (six) hours as needed for nausea or vomiting.   Yes Historical Provider, MD  medroxyPROGESTERone (PROVERA) 10 MG tablet Take one tab daily D1-14 of each mos x 3 mos. 06/21/14   Anastasio Auerbach, MD   BP 135/81 mmHg  Pulse 100  Temp(Src) 99 F (37.2 C) (Oral)  Resp 20  Ht 5' (1.524 m)  Wt 153 lb (69.4 kg)  BMI 29.88 kg/m2  SpO2 99%  LMP 09/13/2014 Physical Exam  Constitutional: She is oriented to person, place, and time. She appears well-developed and well-nourished. No distress.  HENT:  Head: Atraumatic.  Eyes: Conjunctivae are normal.  Neck: Neck supple.  Cardiovascular: Normal rate, regular rhythm and intact distal pulses.   Pulmonary/Chest: Effort normal and breath sounds normal.  Abdominal: Soft. There is tenderness (mild suprapubic tenderness without guarding or rebound tenderness).  Genitourinary:  No CVA tenderness  Musculoskeletal: She exhibits tenderness (Lumbar and paralumbar spine tenderness on palpation ).  Neurological: She is alert and oriented to person, place, and time.  Patellar deep tendon  reflex intact bilaterally, normal strength to bilateral lower extremities without foot drops. Intact distal pedal pulses.  Skin: No rash noted.  Psychiatric: She has a normal mood and affect.  Nursing note and vitals reviewed.   ED Course  Procedures (including critical care time)  Patient presents with dysuria and low back pain urine showed evidence of urinary tract infection. She notes nausea without vomiting. Her electrolytes are reassuring and normal WBC. She does have some radicular pain but no red flags. Low suspicion for cauda equina. Will get antibiotic and patient will be discharge with pain medication and antibiotic. Urine culture sent.  8:36 PM Nausea has resolved. Patient able to start his by mouth. The pain is well controlled. She has received antibiotic and she is stable for discharge.  Labs Review Labs Reviewed  CBC WITH DIFFERENTIAL/PLATELET - Abnormal; Notable for the following:    Neutrophils Relative % 83 (*)    Lymphocytes Relative 10 (*)    All other components within normal limits  COMPREHENSIVE METABOLIC PANEL - Abnormal; Notable for the following:    Glucose, Bld 100 (*)    Calcium 8.8 (*)    All other components within normal limits  LIPASE, BLOOD - Abnormal; Notable for the following:    Lipase 18 (*)    All other components within normal limits  URINALYSIS, ROUTINE W REFLEX MICROSCOPIC (NOT AT [facility]) - Abnormal; Notable for the following:    APPearance TURBID (*)    Ketones, ur 15 (*)    Protein, ur 100 (*)    Leukocytes, UA LARGE (*)    All other components within normal limits  URINE MICROSCOPIC-ADD ON - Abnormal; Notable for the following:    Squamous Epithelial / LPF FEW (*)    Bacteria, UA MANY (*)    Casts GRANULAR CAST (*)    All other components within normal limits    Imaging Review No results found.   EKG Interpretation None      MDM   Final diagnoses:  UTI (lower urinary tract infection)  Chronic back pain    BP 138/84 mmHg   Pulse 107  Temp(Src) 99 F (37.2 C) (Oral)  Resp 20  Ht 5' (1.524 m)  Wt 153 lb (69.4 kg)  BMI 29.88 kg/m2  SpO2 99%  LMP 09/13/2014     Domenic Moras, PA-C 10/09/14 2039  Daleen Bo, MD 10/10/14 [phone]

## 2014-10-09 NOTE — Discharge Instructions (Signed)
Urinary Tract Infection Urinary tract infections (UTIs) can develop anywhere along your urinary tract. Your urinary tract is your body's drainage system for removing wastes and extra water. Your urinary tract includes two kidneys, two ureters, a bladder, and a urethra. Your kidneys are a pair of bean-shaped organs. Each kidney is about the size of your fist. They are located below your ribs, one on each side of your spine. CAUSES Infections are caused by microbes, which are microscopic organisms, including fungi, viruses, and bacteria. These organisms are so small that they can only be seen through a microscope. Bacteria are the microbes that most commonly cause UTIs. SYMPTOMS  Symptoms of UTIs may vary by age and gender of the patient and by the location of the infection. Symptoms in young women typically include a frequent and intense urge to urinate and a painful, burning feeling in the bladder or urethra during urination. Older women and men are more likely to be tired, shaky, and weak and have muscle aches and abdominal pain. A fever may mean the infection is in your kidneys. Other symptoms of a kidney infection include pain in your back or sides below the ribs, nausea, and vomiting. DIAGNOSIS To diagnose a UTI, your caregiver will ask you about your symptoms. Your caregiver also will ask to provide a urine sample. The urine sample will be tested for bacteria and white blood cells. White blood cells are made by your body to help fight infection. TREATMENT  Typically, UTIs can be treated with medication. Because most UTIs are caused by a bacterial infection, they usually can be treated with the use of antibiotics. The choice of antibiotic and length of treatment depend on your symptoms and the type of bacteria causing your infection. HOME CARE INSTRUCTIONS  If you were prescribed antibiotics, take them exactly as your caregiver instructs you. Finish the medication even if you feel better after you  have only taken some of the medication.  Drink enough water and fluids to keep your urine clear or pale yellow.  Avoid caffeine, tea, and carbonated beverages. They tend to irritate your bladder.  Empty your bladder often. Avoid holding urine for long periods of time.  Empty your bladder before and after sexual intercourse.  After a bowel movement, women should cleanse from front to back. Use each tissue only once. SEEK MEDICAL CARE IF:   You have back pain.  You develop a fever.  Your symptoms do not begin to resolve within 3 days. SEEK IMMEDIATE MEDICAL CARE IF:   You have severe back pain or lower abdominal pain.  You develop chills.  You have nausea or vomiting.  You have continued burning or discomfort with urination. MAKE SURE YOU:   Understand these instructions.  Will watch your condition.  Will get help right away if you are not doing well or get worse. Document Released: 01/06/2005 Document Revised: 09/28/2011 Document Reviewed: 05/07/2011 [facility] Patient Information 2015 [place], [place]. This information is not intended to replace advice given to you by your health care provider. Make sure you discuss any questions you have with your health care provider.   Emergency Department Resource Guide 1) Find a Doctor and Pay Out of Pocket Although you won't have to find out who is covered by your insurance plan, it is a good idea to ask around and get recommendations. You will then need to call the office and see if the doctor you have chosen will accept you as a new patient and what types of  options they offer for patients who are self-pay. Some doctors offer discounts or will set up payment plans for their patients who do not have insurance, but you will need to ask so you aren't surprised when you get to your appointment.  2) Contact Your Local Health Department Not all health departments have doctors that can see patients for sick visits, but many do, so it is worth  a call to see if yours does. If you don't know where your local health department is, you can check in your phone book. The CDC also has a tool to help you locate your state's health department, and many state websites also have listings of all of their local health departments.  3) Find a [facility] If your illness is not likely to be very severe or complicated, you may want to try a walk in clinic. These are popping up all over the country in pharmacies, drugstores, and shopping centers. They're usually staffed by nurse practitioners or physician assistants that have been trained to treat common illnesses and complaints. They're usually fairly quick and inexpensive. However, if you have serious medical issues or chronic medical problems, these are probably not your best option.  No Primary Care Doctor: - Call Health Connect at  [phone] - they can help you locate a primary care doctor that  accepts your insurance, provides certain services, etc. - Physician Referral Service- [phone]  Chronic Pain Problems: Organization         Address  Phone   Notes  [facility]  [phone] Patients need to be referred by their primary care doctor.   Medication Assistance: Organization         Address  Phone   Notes  [facility] Medication [facility] [address] [place], [place] [address] [phone] --Must be a resident of [facility] -- Must have NO insurance coverage whatsoever (no Medicaid/ Medicare, etc.) -- The pt. MUST have a primary care doctor that directs their care regularly and follows them in the community   MedAssist  [phone]   [employer]  [phone]    Agencies that provide inexpensive medical care: Organization         Address  Phone   Notes  [place]  [phone]   [facility]    [phone]   [facility] [place], [place]  [phone]   [place] [address] [place] [phone]   Planned Parenthood    [phone]   [facility]    [phone]   [place] and [place] [address], [place] Phone:  [phone], Fax:  [phone] Hours of Operation:  9 am - 6 pm, M-F.  Also accepts Medicaid/Medicare and self-pay.  [facility] for [place], [address], [place] Phone: [phone], Fax: [phone]. Hours of Operation:  8:30 am - 5:30 pm, M-F.  Also accepts Medicaid and self-pay.  [facility] [place] [address] [place] Phone: [phone]   [place], [place], [place] [phone], Ext. [phone] Mondays & Thursdays: 7-9 AM.  First 15 patients are seen on a first come, first serve basis.    [place] Providers:  Organization         Address  Phone   Notes  [facility] [address], [place] [phone] Also accepts self-pay patients.  [facility] [address], [place]  [phone]   [place], [address], [place] [phone]   [facility] [address], [place] [phone]   Lucianne Lei [address] [place] [address], [place]   [phone] Only accepts [place] Access [place] patients after they have their name applied to their card.   Self-Pay (no insurance) in [facility]:  Organization         Address  Phone   Notes  Sickle Cell Patients, [facility] [place] [phone]   [facility] [place] [phone]   [facility] [place]  [place], [place], [place] [phone]   [facility]/Dr. Osei-Bonsu  [address] [place] or [address], [place] [phone] Phone number for both [place] and [place] locations is the same.  Urgent Medical and [facility] [address] [place] [phone]   [facility] [address], [place] or [address] [phone]   [facility] [address], [place] [phone], phone; [phone], fax Sees patients 1st and 3rd Saturday of every month.  Must not qualify for public or private insurance (i.e. Medicaid, Medicare, [place] Health Choice, Veterans' Benefits)  Household income should be no more than 200% of the poverty level The clinic cannot treat you if you are pregnant or think you are pregnant  Sexually transmitted diseases are not treated at the clinic.    Dental Care: Organization         Address  Phone  Notes  [facility] Department of [facility] [place] [phone] Accepts children up to age 2 who are enrolled in [place] or [place]; pregnant women with a Medicaid card; and children who have applied for Medicaid or [place] Health Choice, but were declined, whose parents can pay a reduced fee at time of service.  [facility] Department of [facility]  [address], [place] [phone] Accepts children up to age 23 who are enrolled in [place] or [place]; pregnant women with a Medicaid card; and children who have applied for Medicaid or [place] Health Choice, but were declined, whose parents can pay a reduced fee at time of service.  [place] Adult Dental Access PROGRAM  [place] [phone] Patients are seen by appointment only. Walk-ins are not accepted. [place] will see patients 30 years of age and older. Monday - Tuesday (8am-5pm) Most Wednesdays (8:30-5pm) $30 per visit, cash only  [facility] Adult Dental Access PROGRAM  [address], [facility] [phone]  Patients are seen by appointment only. Walk-ins are not accepted. [place] will see patients 97 years of age and older. One Wednesday Evening (Monthly: Volunteer Based).  $30 per visit, cash only  [place]  [phone] for adults; Children under age 70, call Graduate Pediatric Dentistry at [phone]. Children aged 87-14, please call [phone] to request a pediatric application.  Dental services are provided in all areas of dental care including fillings, crowns and bridges, complete and partial  dentures, implants, gum treatment, root canals, and extractions. Preventive care is also provided. Treatment is provided to both adults and children. Patients are selected via a lottery and there is often a waiting list.   [facility] [address], [place]  [phone] [email or web address]   [facility] [address] [place], [place] [phone], Ext. [phone] Second and Fourth Thursday of each month, opens at 6:30 AM; Clinic ends at 9 AM.  Patients are seen on a first-come first-served basis, and a limited number are seen during each clinic.   [facility]  [address] Hillard Danker [place], [place] [phone]   Eligibility Requirements You must have lived in [place], [place], or [place] counties for at least the last three months.   You cannot be eligible for state or federal sponsored [employer], including [employer], [place], or [employer].   You generally cannot be eligible for healthcare insurance through your employer.    How to apply: Eligibility screenings are held every Tuesday and Wednesday afternoon from 1:00 pm until 4:00 pm. You do not need an appointment for the interview!  [facility] [address], [place], [place]   [place]  [place] Department  [place]   [phone]    Behavioral Health Resources in the Community: Intensive Outpatient Programs Organization         Address  Phone  Notes  [facility] [address] [place], [place] [phone]   [facility] Outpatient [address] [place], [place]   ADS: Alcohol & Drug Svcs [address] [place], [place]   [place] [address],  [place], [place] or [phone]   Substance Abuse Resources Organization         Address  Phone  Notes  Alcohol and Drug Services  [phone]   [place]  [phone]   [facility] [place]   Chinita Pester  [phone]   Residential & Outpatient Substance Abuse Program  [phone]   Psychological Services Organization         Address  Phone  Notes  [facility]  [place]  [phone]   [place] [address] [place] or [phone]    Mobile Crisis Teams Organization         Address  Phone  Notes  [facility], Mobile Crisis Care Unit  [phone]   [facility] Psychotherapeutic Services  [address] [place], [place]   [place] [address], [place] [phone]    Self-Help/Support Groups Organization         Address  Phone             Notes  [place]. of [place]fort - variety of support groups  [place] Call for more information  Narcotics Anonymous (NA), [facility] [address], [employer]   2 meetings at this location   Special educational needs teacher         Address  Phone  Notes  [facility]idential [facility] [place],    [place]  [place]  [address] [place] [phone], [place], [place]   [place], [place] [phone] Admissions: 8am-3pm M-F  Incentives Substance Magnet [address]    [place], [place], [place] [phone]   [facility] [address] [place], [place], [place]   [facility] [address],  [place], [place]   [facility]ight Programs - Intensive Outpatient [address], [place], [place]   [name] ([place].,  [place], [place] [phone] or [phone]   [facility] Services (RTS) [address] [place], [place] Accepts Medicaid  [place] [address]  [place] [phone] Substance Abuse/Addiction Treatment   [facility] Organization         Address  Phone  Notes  [facility]  [phone]   Domenic Schwab, PhD [address] Arlis Porta [employer], [place]   [phone] or [phone]   Isabella Bellefonte [place], [place] [phone]   [facility] [address], [place], [place] [phone] Insurance/Medicaid/sponsorship through [facility] and Families [address], [address] [place]                                    [place], [place] [phone] [place] [address], [place] [phone]    Dr. Adele Schilder  [phone]   [facility] [place] Dept. 1) [address] [place] 2) [place] 3)  [place] [address], [place] [phone]  [phone]   [place] [phone] or [phone] (After Hours)

## 2014-10-09 NOTE — ED Notes (Addendum)
PT requesting to be tested for UTI. RN notified

## 2014-10-11 ENCOUNTER — Telehealth: Payer: Self-pay | Admitting: Gynecology

## 2014-10-11 NOTE — Telephone Encounter (Signed)
10/11/14-Pt was advised today that her [facility] will cover her sonohysterogram & biopsy under her $25.00 copay.wl

## 2014-10-14 LAB — URINE CULTURE: Culture: >=100000

## 2014-10-15 ENCOUNTER — Telehealth (HOSPITAL_BASED_OUTPATIENT_CLINIC_OR_DEPARTMENT_OTHER): Payer: Self-pay | Admitting: Emergency Medicine

## 2014-10-15 NOTE — Telephone Encounter (Signed)
Post ED Visit - Positive Culture Follow-up  Culture report reviewed by antimicrobial stewardship pharmacist: []  Wes [place], Pharm.D., BCPS []  Heide Guile, Pharm.D., BCPS []  Alycia Rossetti, Pharm.D., BCPS []  [place], Pharm.D., BCPS, AAHIVP []  Legrand Como, Pharm.D., BCPS, AAHIVP []  Isac Sarna, Pharm.D., BCPS [place] Pharm D  Positive urine  Culture E. coli Treated with cephalexin, organism sensitive to the same and no further patient follow-up is required at this time.  Hazle Nordmann 10/15/2014, 8:43 AM

## 2014-10-16 ENCOUNTER — Ambulatory Visit: Payer: BLUE CROSS/BLUE SHIELD | Admitting: Gynecology

## 2014-10-16 ENCOUNTER — Other Ambulatory Visit: Payer: BLUE CROSS/BLUE SHIELD

## 2014-11-12 ENCOUNTER — Emergency Department (HOSPITAL_COMMUNITY)
Admission: EM | Admit: 2014-11-12 | Discharge: 2014-11-12 | Disposition: A | Discharge status: Home / Self Care | Payer: BLUE CROSS/BLUE SHIELD | Attending: Emergency Medicine | Admitting: Emergency Medicine

## 2014-11-12 ENCOUNTER — Encounter (HOSPITAL_COMMUNITY): Payer: Self-pay | Admitting: Emergency Medicine

## 2014-11-12 DIAGNOSIS — Z79899 Other long term (current) drug therapy: Secondary | ICD-10-CM | POA: Diagnosis not present

## 2014-11-12 DIAGNOSIS — Z8679 Personal history of other diseases of the circulatory system: Secondary | ICD-10-CM | POA: Diagnosis not present

## 2014-11-12 DIAGNOSIS — Z8719 Personal history of other diseases of the digestive system: Secondary | ICD-10-CM | POA: Insufficient documentation

## 2014-11-12 DIAGNOSIS — R112 Nausea with vomiting, unspecified: Secondary | ICD-10-CM | POA: Diagnosis present

## 2014-11-12 DIAGNOSIS — N390 Urinary tract infection, site not specified: Secondary | ICD-10-CM | POA: Insufficient documentation

## 2014-11-12 DIAGNOSIS — Z3202 Encounter for pregnancy test, result negative: Secondary | ICD-10-CM | POA: Insufficient documentation

## 2014-11-12 LAB — CBC
HCT: 36.7 % (ref 36.0–46.0)
Hemoglobin: 12.1 g/dL (ref 12.0–15.0)
MCH: 29.5 pg (ref 26.0–34.0)
MCHC: 33 g/dL (ref 30.0–36.0)
MCV: 89.5 fL (ref 78.0–100.0)
Platelets: 206 10*3/uL (ref 150–400)
RBC: 4.1 MIL/uL (ref 3.87–5.11)
RDW: 14.3 % (ref 11.5–15.5)
WBC: 7.6 10*3/uL (ref 4.0–10.5)

## 2014-11-12 LAB — COMPREHENSIVE METABOLIC PANEL
ALT: 15 U/L (ref 14–54)
AST: 19 U/L (ref 15–41)
Albumin: 3.5 g/dL (ref 3.5–5.0)
Alkaline Phosphatase: 58 U/L (ref 38–126)
Anion gap: 9 (ref 5–15)
BUN: 9 mg/dL (ref 6–20)
CO2: 24 mmol/L (ref 22–32)
Calcium: 8.7 mg/dL — ABNORMAL LOW (ref 8.9–10.3)
Chloride: 102 mmol/L (ref 101–111)
Creatinine, Ser: 0.75 mg/dL (ref 0.44–1.00)
GFR calc Af Amer: >60 mL/min (ref >60)
GFR calc non Af Amer: >60 mL/min (ref >60)
Glucose, Bld: 128 mg/dL — ABNORMAL HIGH (ref 65–99)
Potassium: 3.5 mmol/L (ref 3.5–5.1)
Sodium: 135 mmol/L (ref 135–145)
Total Bilirubin: 0.7 mg/dL (ref 0.3–1.2)
Total Protein: 6.2 g/dL — ABNORMAL LOW (ref 6.5–8.1)

## 2014-11-12 LAB — URINALYSIS, ROUTINE W REFLEX MICROSCOPIC
Bilirubin Urine: NEGATIVE
Glucose, UA: NEGATIVE mg/dL
Ketones, ur: NEGATIVE mg/dL
Nitrite: POSITIVE — AB
Protein, ur: 30 mg/dL — AB
Specific Gravity, Urine: 1.016 (ref 1.005–1.030)
Urobilinogen, UA: 0.2 mg/dL (ref 0.0–1.0)
pH: 8 (ref 5.0–8.0)

## 2014-11-12 LAB — LIPASE, BLOOD: Lipase: 19 U/L — ABNORMAL LOW (ref 22–51)

## 2014-11-12 LAB — URINE MICROSCOPIC-ADD ON

## 2014-11-12 LAB — POC URINE PREG, ED: Preg Test, Ur: NEGATIVE

## 2014-11-12 MED ORDER — SODIUM CHLORIDE 0.9 % IV BOLUS (SEPSIS)
1000.0000 mL | Freq: Once | INTRAVENOUS | Status: AC
  Administered 2014-11-12: 1000 mL via INTRAVENOUS

## 2014-11-12 MED ORDER — CEPHALEXIN 500 MG PO CAPS: 500.0000 mg | ORAL_CAPSULE | Freq: Four times a day (QID) | ORAL | Status: DC

## 2014-11-12 MED ORDER — DEXTROSE 5 % IV SOLN
1.0000 g | Freq: Once | INTRAVENOUS | Status: AC
  Administered 2014-11-12: 1 g via INTRAVENOUS
  Filled 2014-11-12: qty 10

## 2014-11-12 MED ORDER — PROMETHAZINE HCL 25 MG PO TABS: 25.0000 mg | ORAL_TABLET | Freq: Four times a day (QID) | ORAL | Status: DC | PRN

## 2014-11-12 MED ORDER — KETOROLAC TROMETHAMINE 15 MG/ML IJ SOLN
15.0000 mg | Freq: Once | INTRAMUSCULAR | Status: AC
  Administered 2014-11-12: 15 mg via INTRAVENOUS
  Filled 2014-11-12: qty 1

## 2014-11-12 MED ORDER — PROMETHAZINE HCL 25 MG/ML IJ SOLN
25.0000 mg | Freq: Once | INTRAMUSCULAR | Status: AC
  Administered 2014-11-12: 25 mg via INTRAMUSCULAR
  Filled 2014-11-12: qty 1

## 2014-11-12 NOTE — ED Notes (Signed)
Dr. Ralene Bathe with patient at bedside at this time.

## 2014-11-12 NOTE — ED Provider Notes (Signed)
CSN: [id number]     Arrival date & time 11/12/14  0421 History   First MD Initiated Contact with Patient 11/12/14 0441     Chief Complaint  Patient presents with  . Nausea  . Emesis  . Urinary Tract Infection     Patient is a 49 y.o. female presenting with vomiting and urinary tract infection. The history is provided by the patient. No language interpreter was used.  Emesis Urinary Tract Infection Associated symptoms: vomiting    [name] presents for evaluation of vomiting and dysuria. Her symptoms started yesterday. She's had moderate amounts of vomiting with a small amount of diarrhea. She denies any fevers. She denies any abdominal pain. She does have low back pain bilaterally. The back pain is been present for the last several months. The back pain worsened today. She has associated dysuria, urinary frequency, foul-smelling urine. She has a history of urinary tract infection was treated with Avelox about a month ago for similar symptoms and her symptoms did improve in that time and now they have returned. Symptoms are moderate, constant, worsening.  Past Medical History  Diagnosis Date  . Migraine with visual aura   . UTI (lower urinary tract infection)   . GERD (gastroesophageal reflux disease)   . Celiac disease    Past Surgical History  Procedure Laterality Date  . Tubal ligation    . Exploratory laparotomy    . Bladder suspension     Family History  Problem Relation Age of Onset  . Diabetes Son 12    type 1   History  Substance Use Topics  . Smoking status: Never Smoker   . Smokeless tobacco: Never Used  . Alcohol Use: No   OB History    No data available     Review of Systems  Gastrointestinal: Positive for vomiting.  All other systems reviewed and are negative.     Allergies  Gluten meal and Ondansetron  Home Medications   Prior to Admission medications   Medication Sig Start Date End Date Taking? Authorizing Provider  Cranberry-Vitamin  C-Probiotic (AZO CRANBERRY) 250-30 MG TABS Take 1 tablet by mouth as needed (for bladder).   Yes Historical Provider, MD  ibuprofen (ADVIL,MOTRIN) 200 MG tablet Take 800 mg by mouth every 6 (six) hours as needed (pain).   Yes Historical Provider, MD  cephALEXin (KEFLEX) 500 MG capsule Take 1 capsule (500 mg total) by mouth 4 (four) times daily. 11/12/14   Ariel Reichert, MD  promethazine (PHENERGAN) 25 MG tablet Take 1 tablet (25 mg total) by mouth every 6 (six) hours as needed for nausea or vomiting. 11/12/14   Ariel Reichert, MD   BP 132/70 mmHg  Pulse 92  Temp(Src) 99.8 F (37.7 C) (Oral)  Ht 5' (1.524 m)  Wt 157 lb (71.215 kg)  BMI 30.66 kg/m2  SpO2 98%  LMP 10/12/2014 (Approximate) Physical Exam  Constitutional: She is oriented to person, place, and time. She appears well-developed and well-nourished.  HENT:  Head: Normocephalic and atraumatic.  Cardiovascular: Normal rate and regular rhythm.   No murmur heard. Pulmonary/Chest: Effort normal and breath sounds normal. No respiratory distress.  Abdominal: Soft. There is no rebound and no guarding.  Mild suprapubic tenderness  Musculoskeletal: She exhibits no edema or tenderness.  Neurological: She is alert and oriented to person, place, and time.  Skin: Skin is warm and dry.  Psychiatric: She has a normal mood and affect. Her behavior is normal.  Nursing note and vitals reviewed.  ED Course  Procedures (including critical care time) Labs Review Labs Reviewed  LIPASE, BLOOD - Abnormal; Notable for the following:    Lipase 19 (*)    All other components within normal limits  COMPREHENSIVE METABOLIC PANEL - Abnormal; Notable for the following:    Glucose, Bld 128 (*)    Calcium 8.7 (*)    Total Protein 6.2 (*)    All other components within normal limits  URINALYSIS, ROUTINE W REFLEX MICROSCOPIC (NOT AT [facility]) - Abnormal; Notable for the following:    APPearance TURBID (*)    Hgb urine dipstick SMALL (*)    Protein, ur 30 (*)     Nitrite POSITIVE (*)    Leukocytes, UA MODERATE (*)    All other components within normal limits  URINE MICROSCOPIC-ADD ON - Abnormal; Notable for the following:    Squamous Epithelial / LPF FEW (*)    Bacteria, UA MANY (*)    All other components within normal limits  URINE CULTURE  CBC  POC URINE PREG, ED    Imaging Review No results found.   EKG Interpretation None      MDM   Final diagnoses:  Acute UTI    Patient here for evaluation of dysuria, low back pain, vomiting. Patient with acute urinary tract infection. History of presentation is not consistent with renal colic, appendicitis. Patient tolerating oral fluids in the department. Discussed with patient having care for urinary tract infection. Sending urine culture due to patient's recurrent infections. Return precautions were discussed.  Ariel Reichert, MD 11/12/14 [phone]

## 2014-11-12 NOTE — ED Notes (Signed)
Patient reports that she has had nausea/vomiting since yesterday (reports vomiting small amounts every 30 minutes). She also c/o lower back pain and urine with foul odor. Reports that she has frequent UTIs. Reports lower back pain 10/10.

## 2014-11-12 NOTE — ED Notes (Signed)
Patient provided with ginger ale with ice for PO challenge. Tolerating well at this time.

## 2014-11-12 NOTE — Discharge Instructions (Signed)

## 2014-11-14 LAB — URINE CULTURE: Culture: >=100000

## 2014-11-15 ENCOUNTER — Telehealth: Payer: Self-pay | Admitting: Emergency Medicine

## 2014-11-15 NOTE — Telephone Encounter (Signed)
Post ED Visit - Positive Culture Follow-up  Culture report reviewed by antimicrobial stewardship pharmacist: []  Wes [place], Pharm.D., BCPS []  Heide Guile, Pharm.D., BCPS []  Alycia Rossetti, Pharm.D., BCPS []  [place], Pharm.D., BCPS, AAHIVP []  Legrand Como, Pharm.D., BCPS, AAHIVP [x]  Dimitri Ped, [facility]  Positive Urine culture Treated with Cephalexin, organism sensitive to the same and no further patient follow-up is required at this time.  Ernesta Amble 11/15/2014, 5:06 PM

## 2015-04-25 ENCOUNTER — Emergency Department (HOSPITAL_COMMUNITY)
Admission: EM | Admit: 2015-04-25 | Discharge: 2015-04-25 | Disposition: A | Discharge status: Home / Self Care | Payer: BLUE CROSS/BLUE SHIELD | Attending: Emergency Medicine | Admitting: Emergency Medicine

## 2015-04-25 ENCOUNTER — Encounter (HOSPITAL_COMMUNITY): Payer: Self-pay

## 2015-04-25 DIAGNOSIS — Z3202 Encounter for pregnancy test, result negative: Secondary | ICD-10-CM | POA: Insufficient documentation

## 2015-04-25 DIAGNOSIS — N390 Urinary tract infection, site not specified: Secondary | ICD-10-CM | POA: Diagnosis not present

## 2015-04-25 DIAGNOSIS — Z8679 Personal history of other diseases of the circulatory system: Secondary | ICD-10-CM | POA: Insufficient documentation

## 2015-04-25 DIAGNOSIS — R300 Dysuria: Secondary | ICD-10-CM | POA: Diagnosis present

## 2015-04-25 LAB — URINALYSIS, ROUTINE W REFLEX MICROSCOPIC
Bilirubin Urine: NEGATIVE
Glucose, UA: NEGATIVE mg/dL
Ketones, ur: NEGATIVE mg/dL
Nitrite: POSITIVE — AB
Protein, ur: NEGATIVE mg/dL
Specific Gravity, Urine: 1.03 (ref 1.005–1.030)
pH: 6 (ref 5.0–8.0)

## 2015-04-25 LAB — COMPREHENSIVE METABOLIC PANEL
ALT: 20 U/L (ref 14–54)
AST: 29 U/L (ref 15–41)
Albumin: 3.6 g/dL (ref 3.5–5.0)
Alkaline Phosphatase: 68 U/L (ref 38–126)
Anion gap: 7 (ref 5–15)
BUN: 13 mg/dL (ref 6–20)
CO2: 27 mmol/L (ref 22–32)
Calcium: 8.6 mg/dL — ABNORMAL LOW (ref 8.9–10.3)
Chloride: 104 mmol/L (ref 101–111)
Creatinine, Ser: 0.72 mg/dL (ref 0.44–1.00)
GFR calc Af Amer: >60 mL/min (ref >60)
GFR calc non Af Amer: >60 mL/min (ref >60)
Glucose, Bld: 121 mg/dL — ABNORMAL HIGH (ref 65–99)
Potassium: 3.7 mmol/L (ref 3.5–5.1)
Sodium: 138 mmol/L (ref 135–145)
Total Bilirubin: 0.6 mg/dL (ref 0.3–1.2)
Total Protein: 6.7 g/dL (ref 6.5–8.1)

## 2015-04-25 LAB — CBC
HCT: 41.1 % (ref 36.0–46.0)
Hemoglobin: 13.4 g/dL (ref 12.0–15.0)
MCH: 29.9 pg (ref 26.0–34.0)
MCHC: 32.6 g/dL (ref 30.0–36.0)
MCV: 91.7 fL (ref 78.0–100.0)
Platelets: 214 10*3/uL (ref 150–400)
RBC: 4.48 MIL/uL (ref 3.87–5.11)
RDW: 14 % (ref 11.5–15.5)
WBC: 5.2 10*3/uL (ref 4.0–10.5)

## 2015-04-25 LAB — URINE MICROSCOPIC-ADD ON

## 2015-04-25 LAB — POC URINE PREG, ED: Preg Test, Ur: NEGATIVE

## 2015-04-25 LAB — LIPASE, BLOOD: Lipase: 29 U/L (ref 11–51)

## 2015-04-25 MED ORDER — PROMETHAZINE HCL 25 MG/ML IJ SOLN
12.5000 mg | Freq: Once | INTRAMUSCULAR | Status: AC
  Administered 2015-04-25: 12.5 mg via INTRAVENOUS
  Filled 2015-04-25: qty 1

## 2015-04-25 MED ORDER — ONDANSETRON 4 MG PO TBDP
ORAL_TABLET | ORAL | Status: DC
Start: 2015-04-25 — End: 2015-07-06

## 2015-04-25 MED ORDER — SODIUM CHLORIDE 0.9 % IV BOLUS (SEPSIS)
1000.0000 mL | Freq: Once | INTRAVENOUS | Status: AC
  Administered 2015-04-25: 1000 mL via INTRAVENOUS

## 2015-04-25 MED ORDER — CEFTRIAXONE SODIUM 1 G IJ SOLR
1.0000 g | Freq: Once | INTRAMUSCULAR | Status: AC
  Administered 2015-04-25: 1 g via INTRAVENOUS
  Filled 2015-04-25: qty 10

## 2015-04-25 MED ORDER — CEPHALEXIN 500 MG PO CAPS: 500.0000 mg | ORAL_CAPSULE | Freq: Four times a day (QID) | ORAL | Status: DC

## 2015-04-25 MED ORDER — HYDROMORPHONE HCL 1 MG/ML IJ SOLN
1.0000 mg | Freq: Once | INTRAMUSCULAR | Status: AC
  Administered 2015-04-25: 1 mg via INTRAVENOUS
  Filled 2015-04-25: qty 1

## 2015-04-25 NOTE — ED Notes (Signed)
Patient here with dysuria for several days and then started vomiting 4 days agho, complains of weakness

## 2015-04-25 NOTE — ED Provider Notes (Signed)
CSN: [id number]     Arrival date & time 04/25/15  0710 History   First MD Initiated Contact with Patient 04/25/15 [phone]     Chief Complaint  Patient presents with  . Emesis  . Dysuria     (Consider location/radiation/quality/duration/timing/severity/associated sxs/prior Treatment) Patient is a 50 y.o. female presenting with vomiting and dysuria. The history is provided by the patient (Patient complains of dysuria and vomiting).  Emesis Severity:  Moderate Timing:  Intermittent Quality:  Undigested food Able to tolerate:  Liquids Progression:  Unchanged Associated symptoms: no abdominal pain, no diarrhea and no headaches   Dysuria Associated symptoms: vomiting   Associated symptoms: no abdominal pain     Past Medical History  Diagnosis Date  . Migraine with visual aura   . UTI (lower urinary tract infection)   . GERD (gastroesophageal reflux disease)   . Celiac disease    Past Surgical History  Procedure Laterality Date  . Tubal ligation    . Exploratory laparotomy    . Bladder suspension     Family History  Problem Relation Age of Onset  . Diabetes Son 67    type 1   Social History  Substance Use Topics  . Smoking status: Never Smoker   . Smokeless tobacco: Never Used  . Alcohol Use: No   OB History    No data available     Review of Systems  Constitutional: Negative for appetite change and fatigue.  HENT: Negative for congestion, ear discharge and sinus pressure.   Eyes: Negative for discharge.  Respiratory: Negative for cough.   Cardiovascular: Negative for chest pain.  Gastrointestinal: Positive for vomiting. Negative for abdominal pain and diarrhea.  Genitourinary: Positive for dysuria. Negative for frequency and hematuria.  Musculoskeletal: Negative for back pain.  Skin: Negative for rash.  Neurological: Negative for seizures and headaches.  Psychiatric/Behavioral: Negative for hallucinations.      Allergies  Gluten meal and Ondansetron  Home  Medications   Prior to Admission medications   Medication Sig Start Date End Date Taking? Authorizing Provider  ibuprofen (ADVIL,MOTRIN) 200 MG tablet Take 800 mg by mouth every 6 (six) hours as needed (pain).   Yes Historical Provider, MD  cephALEXin (KEFLEX) 500 MG capsule Take 1 capsule (500 mg total) by mouth 4 (four) times daily. 04/25/15   Milton Ferguson, MD  Cranberry-Vitamin C-Probiotic (AZO CRANBERRY) 250-30 MG TABS Take 1 tablet by mouth as needed (for bladder).    Historical Provider, MD  ondansetron (ZOFRAN ODT) 4 MG disintegrating tablet 4mg  ODT q4 hours prn nausea/vomit 04/25/15   Milton Ferguson, MD  promethazine (PHENERGAN) 25 MG tablet Take 1 tablet (25 mg total) by mouth every 6 (six) hours as needed for nausea or vomiting. Patient not taking: Reported on 04/25/2015 11/12/14   Quintella Reichert, MD   BP 109/76 mmHg  Pulse 84  Temp(Src) 98.8 F (37.1 C) (Oral)  Resp 16  Ht 5' (1.524 m)  Wt 165 lb (74.844 kg)  BMI 32.22 kg/m2  SpO2 95%  LMP 04/24/2015 Physical Exam  Constitutional: She is oriented to person, place, and time. She appears well-developed.  HENT:  Head: Normocephalic.  Eyes: Conjunctivae and EOM are normal. No scleral icterus.  Neck: Neck supple. No thyromegaly present.  Cardiovascular: Normal rate and regular rhythm.  Exam reveals no gallop and no friction rub.   No murmur heard. Pulmonary/Chest: No stridor. She has no wheezes. She has no rales. She exhibits no tenderness.  Abdominal: She exhibits no distension. There  is tenderness. There is no rebound.  Minor suprapubic tenderness  Musculoskeletal: Normal range of motion. She exhibits no edema.  Lymphadenopathy:    She has no cervical adenopathy.  Neurological: She is oriented to person, place, and time. She exhibits normal muscle tone. Coordination normal.  Skin: No rash noted. No erythema.  Psychiatric: She has a normal mood and affect. Her behavior is normal.    ED Course  Procedures (including critical  care time) Labs Review Labs Reviewed  COMPREHENSIVE METABOLIC PANEL - Abnormal; Notable for the following:    Glucose, Bld 121 (*)    Calcium 8.6 (*)    All other components within normal limits  URINALYSIS, ROUTINE W REFLEX MICROSCOPIC (NOT AT [facility]) - Abnormal; Notable for the following:    APPearance CLOUDY (*)    Hgb urine dipstick MODERATE (*)    Nitrite POSITIVE (*)    Leukocytes, UA TRACE (*)    All other components within normal limits  URINE MICROSCOPIC-ADD ON - Abnormal; Notable for the following:    Squamous Epithelial / LPF 0-5 (*)    Bacteria, UA MANY (*)    All other components within normal limits  URINE CULTURE  LIPASE, BLOOD  CBC  POC URINE PREG, ED    Imaging Review No results found. I have personally reviewed and evaluated these images and lab results as part of my medical decision-making.   EKG Interpretation None      MDM   Final diagnoses:  UTI (lower urinary tract infection)    UTI will treat with Keflex and Zofran and have patient follow-up with PCP    Milton Ferguson, MD 04/25/15 1139

## 2015-04-25 NOTE — Discharge Instructions (Signed)
Follow up with your md next week. °

## 2015-04-25 NOTE — ED Notes (Signed)
MD at bedside. 

## 2015-04-27 LAB — URINE CULTURE
Culture: >=100000
Special Requests: NORMAL

## 2015-04-28 ENCOUNTER — Telehealth (HOSPITAL_BASED_OUTPATIENT_CLINIC_OR_DEPARTMENT_OTHER): Payer: Self-pay | Admitting: Emergency Medicine

## 2015-04-28 NOTE — Telephone Encounter (Signed)
Post ED Visit - Positive Culture Follow-up  Culture report reviewed by antimicrobial stewardship pharmacist:  []  Elenor Quinones, Pharm.D. []  Heide Guile, Pharm.D., BCPS []  Parks Neptune, Pharm.D. []  Alycia Rossetti, Pharm.D., BCPS []  [place], Pharm.D., BCPS, AAHIVP []  Legrand Como, Pharm.D., BCPS, AAHIVP []  Milus Glazier, Pharm.D. []  Stephens November, Pharm.D.  Positive urine  culture Treated with cephalexin, organism sensitive to the same and no further patient follow-up is required at this time.  Hazle Nordmann 04/28/2015, 11:25 AM

## 2015-07-06 ENCOUNTER — Ambulatory Visit (INDEPENDENT_AMBULATORY_CARE_PROVIDER_SITE_OTHER): Payer: BLUE CROSS/BLUE SHIELD | Admitting: Physician Assistant

## 2015-07-06 VITALS — BP 152/88 | HR 89 | Temp 98.1°F | Resp 16 | Ht 61.0 in | Wt 159.0 lb

## 2015-07-06 DIAGNOSIS — S335XXA Sprain of ligaments of lumbar spine, initial encounter: Secondary | ICD-10-CM | POA: Diagnosis not present

## 2015-07-06 DIAGNOSIS — R350 Frequency of micturition: Secondary | ICD-10-CM

## 2015-07-06 DIAGNOSIS — Z23 Encounter for immunization: Secondary | ICD-10-CM | POA: Diagnosis not present

## 2015-07-06 LAB — POC MICROSCOPIC URINALYSIS (UMFC)

## 2015-07-06 LAB — POCT URINALYSIS DIP (MANUAL ENTRY)
Bilirubin, UA: NEGATIVE
Glucose, UA: NEGATIVE
Ketones, POC UA: NEGATIVE
Leukocytes, UA: NEGATIVE
Nitrite, UA: NEGATIVE
Protein Ur, POC: NEGATIVE
Spec Grav, UA: 1.02
Urobilinogen, UA: 0.2
pH, UA: 6

## 2015-07-06 MED ORDER — CYCLOBENZAPRINE HCL 5 MG PO TABS: 5.0000 mg | ORAL_TABLET | Freq: Three times a day (TID) | ORAL | Status: DC | PRN

## 2015-07-06 MED ORDER — PREDNISONE 10 MG PO TABS: ORAL_TABLET | ORAL | Status: AC

## 2015-07-06 MED ORDER — DICLOFENAC SODIUM 75 MG PO TBEC: 75.0000 mg | DELAYED_RELEASE_TABLET | Freq: Two times a day (BID) | ORAL | Status: DC

## 2015-07-06 NOTE — Progress Notes (Signed)
[name]  MRN: [id number] DOB: July 22, 1965  Subjective:  Pt presents to clinic with contiuned problems with dysuria, urinary urgency and frequency.  Generalized low back pain currently.  No fevers or chills.  Some abd pressure.  A couple of weeks ago she was having low back spasms and some pain into the left hip/thigh area which she is still currently having. She has limited work duties with lifting related to her chronic back problems but over the last couple of days she has done it anyway.  She has also been lifting her grandchildren who live with her.  She has no paresthesias or weakness in her left leg.  They wanted her to go to PT but she was not able to because of the cost.  Patient Active Problem List   Diagnosis Date Noted  . BV (bacterial vaginosis) 11/27/2012  . UTI (lower urinary tract infection) 11/27/2012  . Migraine with visual aura 09/19/2011  . Depression 09/19/2011  . Constipation 06/25/2011    Current Outpatient Prescriptions on File Prior to Visit  Medication Sig Dispense Refill  . [DISCONTINUED] medroxyPROGESTERone (PROVERA) 10 MG tablet Take one tab daily D1-14 of each mos x 3 mos. (Patient not taking: Reported on 11/12/2014) 42 tablet 0   No current facility-administered medications on file prior to visit.    Allergies  Allergen Reactions  . Gluten Meal Diarrhea and Nausea Only       . Ondansetron Other (See Comments)    Helps her nausea, but makes HA worse    Review of Systems  Constitutional: Negative for fever and chills.  Gastrointestinal: Positive for abdominal pain (pressure).  Genitourinary: Negative for dysuria, urgency and frequency.  Musculoskeletal: Positive for back pain (low back). Negative for myalgias.   Objective:  BP 152/88 mmHg  Pulse 89  Temp(Src) 98.1 F (36.7 C) (Oral)  Resp 16  Ht 5\' 1"  (1.549 m)  Wt 159 lb (72.122 kg)  BMI 30.06 kg/m2  SpO2 97%  LMP 07/06/2015 (Exact Date)  Physical Exam  Constitutional: She is  oriented to person, place, and time and well-developed, well-nourished, and in no distress.  HENT:  Head: Normocephalic and atraumatic.  Right Ear: Hearing and external ear normal.  Left Ear: Hearing and external ear normal.  Eyes: Conjunctivae are normal.  Neck: Normal range of motion.  Cardiovascular: Normal rate, regular rhythm and normal heart sounds.   No murmur heard. Pulmonary/Chest: Effort normal and breath sounds normal. She has no wheezes.  Abdominal: Soft. Bowel sounds are normal. There is no tenderness.  Musculoskeletal:       Lumbar back: She exhibits decreased range of motion (increased pain with extension>flexion but she almost to full movement), tenderness and spasm.       Back:  Pain with SLR but she is able to do it  Neurological: She is alert and oriented to person, place, and time. She has normal sensation, normal strength and normal reflexes. She displays no weakness. She has a normal Straight Leg Raise Test. Gait normal. Gait normal.  Skin: Skin is warm and dry.  Psychiatric: Mood, memory, affect and judgment normal.  Vitals reviewed.  Results for orders placed or performed in visit on 07/06/15  POCT urinalysis dipstick  Result Value Ref Range   Color, UA yellow yellow   Clarity, UA cloudy (A) clear   Glucose, UA negative negative   Bilirubin, UA negative negative   Ketones, POC UA negative negative   Spec Grav, UA 1.020    Blood,  UA moderate (A) negative   pH, UA 6.0    Protein Ur, POC negative negative   Urobilinogen, UA 0.2    Nitrite, UA Negative Negative   Leukocytes, UA Negative Negative  POCT Microscopic Urinalysis ([facility])  Result Value Ref Range   WBC,UR,HPF,POC None None WBC/hpf   RBC,UR,HPF,POC Few (A) None RBC/hpf   Bacteria None None, Too numerous to count   Mucus Present (A) Absent   Epithelial Cells, UR Per Microscopy Few (A) None, Too numerous to count cells/hpf    Assessment and Plan :  Urinary frequency - Plan: POCT urinalysis  dipstick, POCT Microscopic Urinalysis ([facility])  Lumbar sprain, initial encounter - Plan: diclofenac (VOLTAREN) 75 MG EC tablet, predniSONE (DELTASONE) 10 MG tablet, cyclobenzaprine (FLEXERIL) 5 MG tablet, Care order/instruction  Need for Tdap vaccination - Plan: Tdap vaccine greater than or equal to 7yo IM   Pt's urine is clear today and her symptoms seem more related to her lumbar strain and sciatica.  We will treat with muscle relaxers and prednisone today and then when the prednisone is finished she will use Voltaren as needed.  She was given home PT exercises to start but encouraged her to call insurance to see if any PT place is cheaper for her.  She will use heat and f/u as needed.  Windell Hummingbird PA-C  [facility] 07/06/2015 1:22 PM

## 2015-07-06 NOTE — Patient Instructions (Addendum)
Low Back Sprain With Rehab A sprain is an injury in which a ligament is torn. The ligaments of the lower back are vulnerable to sprains. However, they are strong and require great force to be injured. These ligaments are important for stabilizing the spinal column. Sprains are classified into three categories. Grade 1 sprains cause pain, but the tendon is not lengthened. Grade 2 sprains include a lengthened ligament, due to the ligament being stretched or partially ruptured. With grade 2 sprains there is still function, although the function may be decreased. Grade 3 sprains involve a complete tear of the tendon or muscle, and function is usually impaired. SYMPTOMS   Severe pain in the lower back.  Sometimes, a feeling of a "pop," "snap," or tear, at the time of injury.  Tenderness and sometimes swelling at the injury site.  Uncommonly, bruising (contusion) within 48 hours of injury.  Muscle spasms in the back. CAUSES  Low back sprains occur when a force is placed on the ligaments that is greater than they can handle. Common causes of injury include:  Performing a stressful act while off-balance.  Repetitive stressful activities that involve movement of the lower back.  Direct hit (trauma) to the lower back. RISK INCREASES WITH:  Contact sports (football, wrestling).  Collisions (major skiing accidents).  Sports that require throwing or lifting (baseball, weightlifting).  Sports involving twisting of the spine (gymnastics, diving, tennis, golf).  Poor strength and flexibility.  Inadequate protection.  Previous back injury or surgery (especially fusion). PREVENTION  Wear properly fitted and padded protective equipment.  Warm up and stretch properly before activity.  Allow for adequate recovery between workouts.  Maintain physical fitness:  Strength, flexibility, and endurance.  Cardiovascular fitness.  Maintain a healthy body weight. PROGNOSIS  If treated properly,  low back sprains usually heal with non-surgical treatment. The length of time for healing depends on the severity of the injury.  RELATED COMPLICATIONS   Recurring symptoms, resulting in a chronic problem.  Chronic inflammation and pain in the low back.  Delayed healing or resolution of symptoms, especially if activity is resumed too soon.  Prolonged impairment.  Unstable or arthritic joints of the low back. TREATMENT  Treatment first involves the use of ice and medicine, to reduce pain and inflammation. The use of strengthening and stretching exercises may help reduce pain with activity. These exercises may be performed at home or with a therapist. Severe injuries may require referral to a therapist for further evaluation and treatment, such as ultrasound. Your caregiver may advise that you wear a back brace or corset, to help reduce pain and discomfort. Often, prolonged bed rest results in greater harm then benefit. Corticosteroid injections may be recommended. However, these should be reserved for the most serious cases. It is important to avoid using your back when lifting objects. At night, sleep on your back on a firm mattress, with a pillow placed under your knees. If non-surgical treatment is unsuccessful, surgery may be needed.  MEDICATION   If pain medicine is needed, nonsteroidal anti-inflammatory medicines (aspirin and ibuprofen), or other minor pain relievers (acetaminophen), are often advised.  Do not take pain medicine for 7 days before surgery.  Prescription pain relievers may be given, if your caregiver thinks they are needed. Use only as directed and only as much as you need.  Ointments applied to the skin may be helpful.  Corticosteroid injections may be given by your caregiver. These injections should be reserved for the most serious cases, because   they may only be given a certain number of times. HEAT AND COLD  Cold treatment (icing) should be applied for 10 to 15  minutes every 2 to 3 hours for inflammation and pain, and immediately after activity that aggravates your symptoms. Use ice packs or an ice massage.  Heat treatment may be used before performing stretching and strengthening activities prescribed by your caregiver, physical therapist, or athletic trainer. Use a heat pack or a warm water soak. SEEK MEDICAL CARE IF:   Symptoms get worse or do not improve in 2 to 4 weeks, despite treatment.  You develop numbness or weakness in either leg.  You lose bowel or bladder function.  Any of the following occur after surgery: fever, increased pain, swelling, redness, drainage of fluids, or bleeding in the affected area.  New, unexplained symptoms develop. (Drugs used in treatment may produce side effects.) EXERCISES  RANGE OF MOTION (ROM) AND STRETCHING EXERCISES - Low Back Sprain Most people with lower back pain will find that their symptoms get worse with excessive bending forward (flexion) or arching at the lower back (extension). The exercises that will help resolve your symptoms will focus on the opposite motion.  Your physician, physical therapist or athletic trainer will help you determine which exercises will be most helpful to resolve your lower back pain. Do not complete any exercises without first consulting with your caregiver. Discontinue any exercises which make your symptoms worse, until you speak to your caregiver. If you have pain, numbness or tingling which travels down into your buttocks, leg or foot, the goal of the therapy is for these symptoms to move closer to your back and eventually resolve. Sometimes, these leg symptoms will get better, but your lower back pain may worsen. This is often an indication of progress in your rehabilitation. Be very alert to any changes in your symptoms and the activities in which you participated in the 24 hours prior to the change. Sharing this information with your caregiver will allow him or her to most  efficiently treat your condition. These exercises may help you when beginning to rehabilitate your injury. Your symptoms may resolve with or without further involvement from your physician, physical therapist or athletic trainer. While completing these exercises, remember:   Restoring tissue flexibility helps normal motion to return to the joints. This allows healthier, less painful movement and activity.  An effective stretch should be held for at least 30 seconds.  A stretch should never be painful. You should only feel a gentle lengthening or release in the stretched tissue. FLEXION RANGE OF MOTION AND STRETCHING EXERCISES: STRETCH - Flexion, Single Knee to Chest   Lie on a firm bed or floor with both legs extended in front of you.  Keeping one leg in contact with the floor, bring your opposite knee to your chest. Hold your leg in place by either grabbing behind your thigh or at your knee.  Pull until you feel a gentle stretch in your low back. Hold __________ seconds.  Slowly release your grasp and repeat the exercise with the opposite side. Repeat __________ times. Complete this exercise __________ times per day.  STRETCH - Flexion, Double Knee to Chest  Lie on a firm bed or floor with both legs extended in front of you.  Keeping one leg in contact with the floor, bring your opposite knee to your chest.  Tense your stomach muscles to support your back and then lift your other knee to your chest. Hold your legs in   place by either grabbing behind your thighs or at your knees.  Pull both knees toward your chest until you feel a gentle stretch in your low back. Hold __________ seconds.  Tense your stomach muscles and slowly return one leg at a time to the floor. Repeat __________ times. Complete this exercise __________ times per day.  STRETCH - Low Trunk Rotation  Lie on a firm bed or floor. Keeping your legs in front of you, bend your knees so they are both pointed toward the  ceiling and your feet are flat on the floor.  Extend your arms out to the side. This will stabilize your upper body by keeping your shoulders in contact with the floor.  Gently and slowly drop both knees together to one side until you feel a gentle stretch in your low back. Hold for __________ seconds.  Tense your stomach muscles to support your lower back as you bring your knees back to the starting position. Repeat the exercise to the other side. Repeat __________ times. Complete this exercise __________ times per day  EXTENSION RANGE OF MOTION AND FLEXIBILITY EXERCISES: STRETCH - Extension, Prone on Elbows   Lie on your stomach on the floor, a bed will be too soft. Place your palms about shoulder width apart and at the height of your head.  Place your elbows under your shoulders. If this is too painful, stack pillows under your chest.  Allow your body to relax so that your hips drop lower and make contact more completely with the floor.  Hold this position for __________ seconds.  Slowly return to lying flat on the floor. Repeat __________ times. Complete this exercise __________ times per day.  RANGE OF MOTION - Extension, Prone Press Ups  Lie on your stomach on the floor, a bed will be too soft. Place your palms about shoulder width apart and at the height of your head.  Keeping your back as relaxed as possible, slowly straighten your elbows while keeping your hips on the floor. You may adjust the placement of your hands to maximize your comfort. As you gain motion, your hands will come more underneath your shoulders.  Hold this position __________ seconds.  Slowly return to lying flat on the floor. Repeat __________ times. Complete this exercise __________ times per day.  RANGE OF MOTION- Quadruped, Neutral Spine   Assume a hands and knees position on a firm surface. Keep your hands under your shoulders and your knees under your hips. You may place padding under your knees for  comfort.  Drop your head and point your tailbone toward the ground below you. This will round out your lower back like an angry cat. Hold this position for __________ seconds.  Slowly lift your head and release your tail bone so that your back sags into a large arch, like an old horse.  Hold this position for __________ seconds.  Repeat this until you feel limber in your low back.  Now, find your "sweet spot." This will be the most comfortable position somewhere between the two previous positions. This is your neutral spine. Once you have found this position, tense your stomach muscles to support your low back.  Hold this position for __________ seconds. Repeat __________ times. Complete this exercise __________ times per day.  STRENGTHENING EXERCISES - Low Back Sprain These exercises may help you when beginning to rehabilitate your injury. These exercises should be done near your "sweet spot." This is the neutral, low-back arch, somewhere between fully rounded and   fully arched, that is your least painful position. When performed in this safe range of motion, these exercises can be used for people who have either a flexion or extension based injury. These exercises may resolve your symptoms with or without further involvement from your physician, physical therapist or athletic trainer. While completing these exercises, remember:   Muscles can gain both the endurance and the strength needed for everyday activities through controlled exercises.  Complete these exercises as instructed by your physician, physical therapist or athletic trainer. Increase the resistance and repetitions only as guided.  You may experience muscle soreness or fatigue, but the pain or discomfort you are trying to eliminate should never worsen during these exercises. If this pain does worsen, stop and make certain you are following the directions exactly. If the pain is still present after adjustments, discontinue the  exercise until you can discuss the trouble with your caregiver. STRENGTHENING - Deep Abdominals, Pelvic Tilt   Lie on a firm bed or floor. Keeping your legs in front of you, bend your knees so they are both pointed toward the ceiling and your feet are flat on the floor.  Tense your lower abdominal muscles to press your low back into the floor. This motion will rotate your pelvis so that your tail bone is scooping upwards rather than pointing at your feet or into the floor. With a gentle tension and even breathing, hold this position for __________ seconds. Repeat __________ times. Complete this exercise __________ times per day.  STRENGTHENING - Abdominals, Crunches   Lie on a firm bed or floor. Keeping your legs in front of you, bend your knees so they are both pointed toward the ceiling and your feet are flat on the floor. Cross your arms over your chest.  Slightly tip your chin down without bending your neck.  Tense your abdominals and slowly lift your trunk high enough to just clear your shoulder blades. Lifting higher can put excessive stress on the lower back and does not further strengthen your abdominal muscles.  Control your return to the starting position. Repeat __________ times. Complete this exercise __________ times per day.  STRENGTHENING - Quadruped, Opposite UE/LE Lift   Assume a hands and knees position on a firm surface. Keep your hands under your shoulders and your knees under your hips. You may place padding under your knees for comfort.  Find your neutral spine and gently tense your abdominal muscles so that you can maintain this position. Your shoulders and hips should form a rectangle that is parallel with the floor and is not twisted.  Keeping your trunk steady, lift your right hand no higher than your shoulder and then your left leg no higher than your hip. Make sure you are not holding your breath. Hold this position for __________ seconds.  Continuing to keep  your abdominal muscles tense and your back steady, slowly return to your starting position. Repeat with the opposite arm and leg. Repeat __________ times. Complete this exercise __________ times per day.  STRENGTHENING - Abdominals and Quadriceps, Straight Leg Raise   Lie on a firm bed or floor with both legs extended in front of you.  Keeping one leg in contact with the floor, bend the other knee so that your foot can rest flat on the floor.  Find your neutral spine, and tense your abdominal muscles to maintain your spinal position throughout the exercise.  Slowly lift your straight leg off the floor about 6 inches for a count of   15, making sure to not hold your breath.  Still keeping your neutral spine, slowly lower your leg all the way to the floor. Repeat this exercise with each leg __________ times. Complete this exercise __________ times per day. POSTURE AND BODY MECHANICS CONSIDERATIONS - Low Back Sprain Keeping correct posture when sitting, standing or completing your activities will reduce the stress put on different body tissues, allowing injured tissues a chance to heal and limiting painful experiences. The following are general guidelines for improved posture. Your physician or physical therapist will provide you with any instructions specific to your needs. While reading these guidelines, remember:  The exercises prescribed by your provider will help you have the flexibility and strength to maintain correct postures.  The correct posture provides the best environment for your joints to work. All of your joints have less wear and tear when properly supported by a spine with good posture. This means you will experience a healthier, less painful body.  Correct posture must be practiced with all of your activities, especially prolonged sitting and standing. Correct posture is as important when doing repetitive low-stress activities (typing) as it is when doing a single heavy-load  activity (lifting). RESTING POSITIONS Consider which positions are most painful for you when choosing a resting position. If you have pain with flexion-based activities (sitting, bending, stooping, squatting), choose a position that allows you to rest in a less flexed posture. You would want to avoid curling into a fetal position on your side. If your pain worsens with extension-based activities (prolonged standing, working overhead), avoid resting in an extended position such as sleeping on your stomach. Most people will find more comfort when they rest with their spine in a more neutral position, neither too rounded nor too arched. Lying on a non-sagging bed on your side with a pillow between your knees, or on your back with a pillow under your knees will often provide some relief. Keep in mind, being in any one position for a prolonged period of time, no matter how correct your posture, can still lead to stiffness. PROPER SITTING POSTURE In order to minimize stress and discomfort on your spine, you must sit with correct posture. Sitting with good posture should be effortless for a healthy body. Returning to good posture is a gradual process. Many people can work toward this most comfortably by using various supports until they have the flexibility and strength to maintain this posture on their own. When sitting with proper posture, your ears will fall over your shoulders and your shoulders will fall over your hips. You should use the back of the chair to support your upper back. Your lower back will be in a neutral position, just slightly arched. You may place a small pillow or folded towel at the base of your lower back for  support.  When working at a desk, create an environment that supports good, upright posture. Without extra support, muscles tire, which leads to excessive strain on joints and other tissues. Keep these recommendations in mind: CHAIR:  A chair should be able to slide under your desk  when your back makes contact with the back of the chair. This allows you to work closely.  The chair's height should allow your eyes to be level with the upper part of your monitor and your hands to be slightly lower than your elbows. BODY POSITION  Your feet should make contact with the floor. If this is not possible, use a foot rest.  Keep your ears   over your shoulders. This will reduce stress on your neck and low back. INCORRECT SITTING POSTURES  If you are feeling tired and unable to assume a healthy sitting posture, do not slouch or slump. This puts excessive strain on your back tissues, causing more damage and pain. Healthier options include:  Using more support, like a lumbar pillow.  Switching tasks to something that requires you to be upright or walking.  Talking a brief walk.  Lying down to rest in a neutral-spine position. PROLONGED STANDING WHILE SLIGHTLY LEANING FORWARD  When completing a task that requires you to lean forward while standing in one place for a long time, place either foot up on a stationary 2-4 inch high object to help maintain the best posture. When both feet are on the ground, the lower back tends to lose its slight inward curve. If this curve flattens (or becomes too large), then the back and your other joints will experience too much stress, tire more quickly, and can cause pain. CORRECT STANDING POSTURES Proper standing posture should be assumed with all daily activities, even if they only take a few moments, like when brushing your teeth. As in sitting, your ears should fall over your shoulders and your shoulders should fall over your hips. You should keep a slight tension in your abdominal muscles to brace your spine. Your tailbone should point down to the ground, not behind your body, resulting in an over-extended swayback posture.  INCORRECT STANDING POSTURES  Common incorrect standing postures include a forward head, locked knees and/or an excessive  swayback. WALKING Walk with an upright posture. Your ears, shoulders and hips should all line-up. PROLONGED ACTIVITY IN A FLEXED POSITION When completing a task that requires you to bend forward at your waist or lean over a low surface, try to find a way to stabilize 3 out of 4 of your limbs. You can place a hand or elbow on your thigh or rest a knee on the surface you are reaching across. This will provide you more stability, so that your muscles do not tire as quickly. By keeping your knees relaxed, or slightly bent, you will also reduce stress across your lower back. CORRECT LIFTING TECHNIQUES DO :  Assume a wide stance. This will provide you more stability and the opportunity to get as close as possible to the object which you are lifting.  Tense your abdominals to brace your spine. Bend at the knees and hips. Keeping your back locked in a neutral-spine position, lift using your leg muscles. Lift with your legs, keeping your back straight.  Test the weight of unknown objects before attempting to lift them.  Try to keep your elbows locked down at your sides in order get the best strength from your shoulders when carrying an object.  Always ask for help when lifting heavy or awkward objects. INCORRECT LIFTING TECHNIQUES DO NOT:   Lock your knees when lifting, even if it is a small object.  Bend and twist. Pivot at your feet or move your feet when needing to change directions.  Assume that you can safely pick up even a paperclip without proper posture.   This information is not intended to replace advice given to you by your health care provider. Make sure you discuss any questions you have with your health care provider.   Document Released: 03/29/2005 Document Revised: 04/19/2014 Document Reviewed: 07/11/2008 [employer] Interactive Patient Education 2016 [employer]     IF you received an x-ray today, you will   receive an invoice from [facility]. Please contact  [facility] at [phone] with questions or concerns regarding your invoice.   IF you received labwork today, you will receive an invoice from [facility]. Please contact [facility] at [phone] with questions or concerns regarding your invoice.   Our billing staff will not be able to assist you with questions regarding bills from these companies.  You will be contacted with the lab results as soon as they are available. The fastest way to get your results is to activate your My Chart account. Instructions are located on the last page of this paperwork. If you have not heard from us regarding the results in 2 weeks, please contact this office.      

## 2015-07-13 ENCOUNTER — Emergency Department (HOSPITAL_COMMUNITY)
Admission: EM | Admit: 2015-07-13 | Discharge: 2015-07-14 | Disposition: A | Discharge status: Home / Self Care | Payer: BLUE CROSS/BLUE SHIELD | Attending: Emergency Medicine | Admitting: Emergency Medicine

## 2015-07-13 ENCOUNTER — Encounter (HOSPITAL_COMMUNITY): Payer: Self-pay

## 2015-07-13 DIAGNOSIS — Z3202 Encounter for pregnancy test, result negative: Secondary | ICD-10-CM | POA: Diagnosis not present

## 2015-07-13 DIAGNOSIS — I10 Essential (primary) hypertension: Secondary | ICD-10-CM | POA: Diagnosis not present

## 2015-07-13 DIAGNOSIS — R1110 Vomiting, unspecified: Secondary | ICD-10-CM

## 2015-07-13 DIAGNOSIS — R112 Nausea with vomiting, unspecified: Secondary | ICD-10-CM | POA: Diagnosis not present

## 2015-07-13 DIAGNOSIS — G43909 Migraine, unspecified, not intractable, without status migrainosus: Secondary | ICD-10-CM | POA: Insufficient documentation

## 2015-07-13 DIAGNOSIS — K219 Gastro-esophageal reflux disease without esophagitis: Secondary | ICD-10-CM | POA: Diagnosis not present

## 2015-07-13 DIAGNOSIS — Z87440 Personal history of urinary (tract) infections: Secondary | ICD-10-CM | POA: Insufficient documentation

## 2015-07-13 DIAGNOSIS — Z791 Long term (current) use of non-steroidal anti-inflammatories (NSAID): Secondary | ICD-10-CM | POA: Diagnosis not present

## 2015-07-13 DIAGNOSIS — R197 Diarrhea, unspecified: Secondary | ICD-10-CM | POA: Diagnosis not present

## 2015-07-13 LAB — COMPREHENSIVE METABOLIC PANEL
ALT: 15 U/L (ref 14–54)
AST: 20 U/L (ref 15–41)
Albumin: 3.7 g/dL (ref 3.5–5.0)
Alkaline Phosphatase: 65 U/L (ref 38–126)
Anion gap: 10 (ref 5–15)
BUN: 16 mg/dL (ref 6–20)
CO2: 26 mmol/L (ref 22–32)
Calcium: 9.5 mg/dL (ref 8.9–10.3)
Chloride: 102 mmol/L (ref 101–111)
Creatinine, Ser: 0.66 mg/dL (ref 0.44–1.00)
GFR calc Af Amer: >60 mL/min (ref >60)
GFR calc non Af Amer: >60 mL/min (ref >60)
Glucose, Bld: 100 mg/dL — ABNORMAL HIGH (ref 65–99)
Potassium: 3.9 mmol/L (ref 3.5–5.1)
Sodium: 138 mmol/L (ref 135–145)
Total Bilirubin: 0.5 mg/dL (ref 0.3–1.2)
Total Protein: 6.6 g/dL (ref 6.5–8.1)

## 2015-07-13 LAB — URINALYSIS, ROUTINE W REFLEX MICROSCOPIC
Bilirubin Urine: NEGATIVE
Glucose, UA: NEGATIVE mg/dL
Ketones, ur: NEGATIVE mg/dL
Nitrite: NEGATIVE
Protein, ur: NEGATIVE mg/dL
Specific Gravity, Urine: 1.016 (ref 1.005–1.030)
pH: 8 (ref 5.0–8.0)

## 2015-07-13 LAB — URINE MICROSCOPIC-ADD ON: RBC / HPF: NONE SEEN RBC/hpf (ref 0–5)

## 2015-07-13 LAB — CBC
HCT: 41 % (ref 36.0–46.0)
Hemoglobin: 13.3 g/dL (ref 12.0–15.0)
MCH: 30.4 pg (ref 26.0–34.0)
MCHC: 32.4 g/dL (ref 30.0–36.0)
MCV: 93.6 fL (ref 78.0–100.0)
Platelets: 312 10*3/uL (ref 150–400)
RBC: 4.38 MIL/uL (ref 3.87–5.11)
RDW: 13.9 % (ref 11.5–15.5)
WBC: 6.8 10*3/uL (ref 4.0–10.5)

## 2015-07-13 LAB — I-STAT BETA HCG BLOOD, ED (MC, WL, AP ONLY): I-stat hCG, quantitative: <5 m[IU]/mL (ref <5)

## 2015-07-13 LAB — LIPASE, BLOOD: Lipase: 30 U/L (ref 11–51)

## 2015-07-13 NOTE — ED Provider Notes (Signed)
CSN: [id number]     Arrival date & time 07/13/15  2153 History   By signing my name below, I, [name], attest that this documentation has been prepared under the direction and in the presence of Ariel Hacker, MD.  Electronically Signed: Forrestine [name], ED Scribe. 07/13/2015. 11:50 PM.   Chief Complaint  Patient presents with  . Emesis  . Hypertension   The history is provided by the patient. No language interpreter was used.    HPI Comments: [name] is a 50 y.o. female without any pertinent past medical history who presents to the Emergency Department complaining of intermittent, ongoing nausea and vomiting onset 1330 this afternoon. Pt reports 2-3 episodes while at work earlier. She states she was released early from work and went to [facility] to check her blood pressure with a recorded reading of 150/102 with a HR of 111. Pt also reports feeling "spacey" earlier and a 5/10 HA currently. However, she states HAs are not unusual for her. No recent fever, chills, chest pain, or shortness of breath. She denies any alcohol consumption or illicit drug use. Mr. Kilcrease is not currently prescribed any medications for high blood pressure.  PCP: [facility]     Past Medical History  Diagnosis Date  . Migraine with visual aura   . UTI (lower urinary tract infection)   . GERD (gastroesophageal reflux disease)   . Celiac disease    Past Surgical History  Procedure Laterality Date  . Tubal ligation    . Exploratory laparotomy    . Bladder suspension     Family History  Problem Relation Age of Onset  . Diabetes Son 73    type 1   Social History  Substance Use Topics  . Smoking status: Never Smoker   . Smokeless tobacco: Never Used  . Alcohol Use: No   OB History    No data available     Review of Systems  Constitutional: Negative for fever and chills.  Respiratory: Negative for shortness of breath.   Cardiovascular: Negative for chest pain.   Gastrointestinal: Positive for nausea and vomiting. Negative for abdominal pain.  Neurological: Positive for headaches.  Psychiatric/Behavioral: Negative for confusion.  All other systems reviewed and are negative.     Allergies  Gluten meal and Ondansetron  Home Medications   Prior to Admission medications   Medication Sig Start Date End Date Taking? Authorizing Provider  cyclobenzaprine (FLEXERIL) 5 MG tablet Take 1 tablet (5 mg total) by mouth 3 (three) times daily as needed for muscle spasms. 07/06/15  Yes Ariel Bale, PA-C  diclofenac (VOLTAREN) 75 MG EC tablet Take 1 tablet (75 mg total) by mouth 2 (two) times daily. 07/06/15  Yes Ariel Bale, PA-C  ranitidine (ZANTAC) 150 MG tablet Take 150 mg by mouth 2 (two) times daily.   Yes Historical Provider, MD  loperamide (IMODIUM) 2 MG capsule Take 1 capsule (2 mg total) by mouth as needed for diarrhea or loose stools. 07/14/15   Ariel Hacker, MD  promethazine (PHENERGAN) 25 MG tablet Take 1 tablet (25 mg total) by mouth every 8 (eight) hours as needed for nausea or vomiting. 07/14/15   Ariel Hacker, MD   Triage Vitals: BP 141/88 mmHg  Pulse 100  Temp(Src) 98.3 F (36.8 C) (Oral)  Resp 18  SpO2 99%  LMP 07/06/2015 (Exact Date)   Physical Exam  Constitutional: She is oriented to person, place, and time. She appears well-developed  and well-nourished. No distress.  HENT:  Head: Normocephalic and atraumatic.  Cardiovascular: Normal rate, regular rhythm and normal heart sounds.   No murmur heard. Pulmonary/Chest: Effort normal and breath sounds normal. No respiratory distress. She has no wheezes.  Abdominal: Soft. Bowel sounds are normal. There is no tenderness. There is no rebound.  Neurological: She is alert and oriented to person, place, and time.  Cranial nerves II through XII intact, 5 out of 5 strength in all 4 extremities  Skin: Skin is warm and dry.  Psychiatric: She has a normal mood and affect.  Nursing note  and vitals reviewed.   ED Course  Procedures (including critical care time)  DIAGNOSTIC STUDIES: Oxygen Saturation is 97% on RA, adequate by my interpretation.    COORDINATION OF CARE: 11:43 PM- Will order blood work. Discussed treatment plan with pt at bedside and pt agreed to plan.     Labs Review Labs Reviewed  COMPREHENSIVE METABOLIC PANEL - Abnormal; Notable for the following:    Glucose, Bld 100 (*)    All other components within normal limits  URINALYSIS, ROUTINE W REFLEX MICROSCOPIC (NOT AT [facility]) - Abnormal; Notable for the following:    APPearance TURBID (*)    Hgb urine dipstick SMALL (*)    Leukocytes, UA SMALL (*)    All other components within normal limits  URINE MICROSCOPIC-ADD ON - Abnormal; Notable for the following:    Squamous Epithelial / LPF 0-5 (*)    Bacteria, UA RARE (*)    Crystals TRIPLE PHOSPHATE CRYSTALS (*)    All other components within normal limits  LIPASE, BLOOD  CBC  I-STAT BETA HCG BLOOD, ED ([facility], [facility], [facility] ONLY)    Imaging Review No results found. I have personally reviewed and evaluated these images and lab results as part of my medical decision-making.   EKG Interpretation   Date/Time:  Monday July 14 2015 01:10:47 EDT Ventricular Rate:  87 PR Interval:  160 QRS Duration: 92 QT Interval:  395 QTC Calculation: 475 R Axis:   62 Text Interpretation:  Sinus rhythm Confirmed by Nelline Lio  MD, [name]  [id number]) on 07/14/2015 1:15:20 AM      MDM   Final diagnoses:  Vomiting and diarrhea  Essential hypertension    Patient presents with less than 12 hours of vomiting and diarrhea. Also noted blood pressure to be mildly elevated at 140s over 100. She is nontoxic on exam. Denies chest pain or other symptoms. Basic labwork is reassuring. EKG is reassuring. Repeat blood pressures here more reassuring with 130s over 90s. She is mildly hypertensive but doubt hypertensive urgency or emergency. Patient was given Phenergan and Imodium. Suspect  early viral illness. She is able to tolerate by mouth. I recommended the patient follow-up with her primary physician for repeat blood pressure check in one week.  After history, exam, and medical workup I feel the patient has been appropriately medically screened and is safe for discharge home. Pertinent diagnoses were discussed with the patient. Patient was given return precautions.  I personally performed the services described in this documentation, which was scribed in my presence. The recorded information has been reviewed and is accurate.   Ariel Hacker, MD 07/14/15 [phone]

## 2015-07-13 NOTE — ED Notes (Signed)
Pt reports nausea and vomiting this afternoon around 1330. She also reports losing track of time today. She got off work, went to [facility] and checked her BP and it was 150/102 and HR 111. Pt does not take BP medication.

## 2015-07-14 MED ORDER — LOPERAMIDE HCL 2 MG PO CAPS: 2.0000 mg | ORAL_CAPSULE | ORAL | Status: DC | PRN

## 2015-07-14 MED ORDER — PROMETHAZINE HCL 25 MG PO TABS: 25.0000 mg | ORAL_TABLET | Freq: Three times a day (TID) | ORAL | Status: DC | PRN

## 2015-07-14 MED ORDER — PROMETHAZINE HCL 25 MG PO TABS
25.0000 mg | ORAL_TABLET | Freq: Once | ORAL | Status: AC
  Administered 2015-07-14: 25 mg via ORAL
  Filled 2015-07-14: qty 1

## 2015-07-14 MED ORDER — LOPERAMIDE HCL 2 MG PO CAPS
4.0000 mg | ORAL_CAPSULE | Freq: Once | ORAL | Status: AC
  Administered 2015-07-14: 4 mg via ORAL
  Filled 2015-07-14: qty 2

## 2015-07-14 NOTE — Discharge Instructions (Signed)

## 2015-08-22 ENCOUNTER — Emergency Department (HOSPITAL_COMMUNITY)
Admission: EM | Admit: 2015-08-22 | Discharge: 2015-08-23 | Disposition: A | Discharge status: Home / Self Care | Payer: BLUE CROSS/BLUE SHIELD | Attending: Emergency Medicine | Admitting: Emergency Medicine

## 2015-08-22 ENCOUNTER — Encounter (HOSPITAL_COMMUNITY): Payer: Self-pay

## 2015-08-22 DIAGNOSIS — Z9851 Tubal ligation status: Secondary | ICD-10-CM | POA: Diagnosis not present

## 2015-08-22 DIAGNOSIS — K219 Gastro-esophageal reflux disease without esophagitis: Secondary | ICD-10-CM | POA: Insufficient documentation

## 2015-08-22 DIAGNOSIS — Z79899 Other long term (current) drug therapy: Secondary | ICD-10-CM | POA: Diagnosis not present

## 2015-08-22 DIAGNOSIS — Z87440 Personal history of urinary (tract) infections: Secondary | ICD-10-CM | POA: Insufficient documentation

## 2015-08-22 DIAGNOSIS — G43101 Migraine with aura, not intractable, with status migrainosus: Secondary | ICD-10-CM | POA: Diagnosis not present

## 2015-08-22 DIAGNOSIS — R112 Nausea with vomiting, unspecified: Secondary | ICD-10-CM | POA: Diagnosis present

## 2015-08-22 LAB — URINE MICROSCOPIC-ADD ON: RBC / HPF: NONE SEEN RBC/hpf (ref 0–5)

## 2015-08-22 LAB — URINALYSIS, ROUTINE W REFLEX MICROSCOPIC
Bilirubin Urine: NEGATIVE
Glucose, UA: NEGATIVE mg/dL
Hgb urine dipstick: NEGATIVE
Ketones, ur: NEGATIVE mg/dL
Nitrite: NEGATIVE
Protein, ur: NEGATIVE mg/dL
Specific Gravity, Urine: 1.014 (ref 1.005–1.030)
pH: 7 (ref 5.0–8.0)

## 2015-08-22 NOTE — ED Notes (Signed)
Pt states shes been nauseated and vomiting since Tuesday. Today began having a migraine. Pt states "I might also have a UTI."

## 2015-08-23 LAB — BASIC METABOLIC PANEL
Anion gap: 9 (ref 5–15)
BUN: 13 mg/dL (ref 6–20)
CO2: 26 mmol/L (ref 22–32)
Calcium: 10.3 mg/dL (ref 8.9–10.3)
Chloride: 106 mmol/L (ref 101–111)
Creatinine, Ser: 0.85 mg/dL (ref 0.44–1.00)
GFR calc Af Amer: >60 mL/min (ref >60)
GFR calc non Af Amer: >60 mL/min (ref >60)
Glucose, Bld: 108 mg/dL — ABNORMAL HIGH (ref 65–99)
Potassium: 4 mmol/L (ref 3.5–5.1)
Sodium: 141 mmol/L (ref 135–145)

## 2015-08-23 LAB — CBC WITH DIFFERENTIAL/PLATELET
Basophils Absolute: 0 10*3/uL (ref 0.0–0.1)
Basophils Relative: 0 %
Eosinophils Absolute: 0.3 10*3/uL (ref 0.0–0.7)
Eosinophils Relative: 5 %
HCT: 40.1 % (ref 36.0–46.0)
Hemoglobin: 13.1 g/dL (ref 12.0–15.0)
Lymphocytes Relative: 16 %
Lymphs Abs: 0.9 10*3/uL (ref 0.7–4.0)
MCH: 29.9 pg (ref 26.0–34.0)
MCHC: 32.7 g/dL (ref 30.0–36.0)
MCV: 91.6 fL (ref 78.0–100.0)
Monocytes Absolute: 0.6 10*3/uL (ref 0.1–1.0)
Monocytes Relative: 11 %
Neutro Abs: 3.6 10*3/uL (ref 1.7–7.7)
Neutrophils Relative %: 68 %
Platelets: 239 10*3/uL (ref 150–400)
RBC: 4.38 MIL/uL (ref 3.87–5.11)
RDW: 13.5 % (ref 11.5–15.5)
WBC: 5.4 10*3/uL (ref 4.0–10.5)

## 2015-08-23 LAB — LIPASE, BLOOD: Lipase: 27 U/L (ref 11–51)

## 2015-08-23 MED ORDER — PROMETHAZINE HCL 25 MG PO TABS: 25.0000 mg | ORAL_TABLET | Freq: Three times a day (TID) | ORAL | Status: DC | PRN

## 2015-08-23 MED ORDER — SODIUM CHLORIDE 0.9 % IV BOLUS (SEPSIS)
1000.0000 mL | Freq: Once | INTRAVENOUS | Status: AC
  Administered 2015-08-23: 1000 mL via INTRAVENOUS

## 2015-08-23 MED ORDER — DIPHENHYDRAMINE HCL 50 MG/ML IJ SOLN
25.0000 mg | Freq: Once | INTRAMUSCULAR | Status: AC
  Administered 2015-08-23: 25 mg via INTRAVENOUS
  Filled 2015-08-23: qty 1

## 2015-08-23 MED ORDER — PANTOPRAZOLE SODIUM 40 MG IV SOLR
40.0000 mg | Freq: Once | INTRAVENOUS | Status: AC
  Administered 2015-08-23: 40 mg via INTRAVENOUS
  Filled 2015-08-23: qty 40

## 2015-08-23 MED ORDER — PROCHLORPERAZINE EDISYLATE 5 MG/ML IJ SOLN
10.0000 mg | Freq: Once | INTRAMUSCULAR | Status: AC
Start: 2015-08-23 — End: 2015-08-23
  Administered 2015-08-23: 10 mg via INTRAVENOUS
  Filled 2015-08-23: qty 2

## 2015-08-23 MED ORDER — OMEPRAZOLE 20 MG PO CPDR: 20.0000 mg | DELAYED_RELEASE_CAPSULE | Freq: Every day | ORAL | Status: DC

## 2015-08-23 NOTE — ED Provider Notes (Signed)
CSN: [id number]     Arrival date & time 08/22/15  2259 History  By signing my name below, I, [name], attest that this documentation has been prepared under the direction and in the presence of [name], MD. Electronically Signed: Randa [name], ED Scribe. 08/23/2015. 12:26 AM.      Chief Complaint  Patient presents with  . Nausea  . Migraine   The history is provided by the patient. No language interpreter was used.   HPI Comments: [name] is a 50 y.o. female who presents to the Emergency Department complaining of nausea and vomiting onset 3 days prior. Pt states that today she began to feel light headed prior to the onset of a migraine HA today. Pt reports hx of migraine HA's. Pt reports seeing floaters and having increased sensitivity to light which is typical of her migraine HA's. Pt reports that she feels as if she has a UTI. She reports slight abdominal pain, intermittent dysuria, vaginal itching and  foul smelling urine that's darker than normal. Pt states that this feels like previous UTI. Pt states she has tried OTC medications with no relief. Pt doesn't report fever, chills or other related symptoms.   Past Medical History  Diagnosis Date  . Migraine with visual aura   . UTI (lower urinary tract infection)   . GERD (gastroesophageal reflux disease)   . Celiac disease    Past Surgical History  Procedure Laterality Date  . Tubal ligation    . Exploratory laparotomy    . Bladder suspension     Family History  Problem Relation Age of Onset  . Diabetes Son 63    type 1   Social History  Substance Use Topics  . Smoking status: Never Smoker   . Smokeless tobacco: Never Used  . Alcohol Use: No   OB History    No data available     Review of Systems  Constitutional: Negative for fever and chills.  Eyes: Positive for photophobia.  Gastrointestinal: Positive for nausea and vomiting. Negative for abdominal pain.  Neurological: Positive for  headaches.  All other systems reviewed and are negative.     Allergies  Gluten meal and Ondansetron  Home Medications   Prior to Admission medications   Medication Sig Start Date End Date Taking? Authorizing Provider  loperamide (IMODIUM) 2 MG capsule Take 1 capsule (2 mg total) by mouth as needed for diarrhea or loose stools. 07/14/15  Yes [name], MD  promethazine (PHENERGAN) 25 MG tablet Take 1 tablet (25 mg total) by mouth every 8 (eight) hours as needed for nausea or vomiting. 07/14/15  Yes [name], MD  ranitidine (ZANTAC) 150 MG tablet Take 150 mg by mouth daily.    Yes Historical Provider, MD  omeprazole (PRILOSEC) 20 MG capsule Take 1 capsule (20 mg total) by mouth daily. 08/23/15   [name], MD   BP 149/98 mmHg  Pulse 88  Temp(Src) 97.5 F (36.4 C) (Oral)  Resp 16  SpO2 97%   Physical Exam  Constitutional: She is oriented to person, place, and time. She appears well-developed and well-nourished. No distress.  HENT:  Head: Normocephalic and atraumatic.  Poor dentition  Eyes: Pupils are equal, round, and reactive to light.  Cardiovascular: Normal rate, regular rhythm and normal heart sounds.   No murmur heard. Pulmonary/Chest: Effort normal and breath sounds normal. No respiratory distress. She has no wheezes.  Abdominal: Soft. Bowel sounds are normal. There is tenderness. There  is no rebound and no guarding.  Mild epigastric tenderness to palpation without rebound or guarding  Neurological: She is alert and oriented to person, place, and time.  Fluent speech, cranial nerves II through XII intact, 5 out of 5 strength in all 4 extremities  Skin: Skin is warm and dry.  Psychiatric: She has a normal mood and affect.  Nursing note and vitals reviewed.   ED Course  Procedures (including critical care time) DIAGNOSTIC STUDIES: Oxygen Saturation is 98% on RA, normal by my interpretation.    COORDINATION OF CARE: 12:25 AM-Discussed treatment  plan with pt at bedside and pt agreed to plan.     Labs Review Labs Reviewed  URINALYSIS, ROUTINE W REFLEX MICROSCOPIC (NOT AT [facility]) - Abnormal; Notable for the following:    APPearance TURBID (*)    Leukocytes, UA SMALL (*)    All other components within normal limits  URINE MICROSCOPIC-ADD ON - Abnormal; Notable for the following:    Squamous Epithelial / LPF 0-5 (*)    Bacteria, UA RARE (*)    All other components within normal limits  BASIC METABOLIC PANEL - Abnormal; Notable for the following:    Glucose, Bld 108 (*)    All other components within normal limits  URINE CULTURE  CBC WITH DIFFERENTIAL/PLATELET  LIPASE, BLOOD    Imaging Review No results found.    EKG Interpretation None      MDM   Final diagnoses:  Migraine with aura and with status migrainosus, not intractable  Gastroesophageal reflux disease, esophagitis presence not specified    Patient presents with vomiting for several days as well as migraine headache. Nontoxic-appearing. Vital signs notable for elevated BP. This improved with pain management. She has mild epigastric tenderness. History of GERD and recently switched from Nexium to Zantac. Suspect this may be the etiology of her persistent nausea. However, will obtain lab work. Patient was also given a migraine cocktail. No red flags for headache. History of migraines. Doubt subarachnoid or meningitis.  2:17 AM Lab work is reassuring. Patient improved after migraine cocktail. Will have patient follow-up closely with her primary physician. Will change to omeprazole for PPI.  After history, exam, and medical workup I feel the patient has been appropriately medically screened and is safe for discharge home. Pertinent diagnoses were discussed with the patient. Patient was given return precautions.  I personally performed the services described in this documentation, which was scribed in my presence. The recorded information has been reviewed and is  accurate.      [name], MD 08/23/15 [phone]

## 2015-08-23 NOTE — Discharge Instructions (Signed)
Gastroesophageal Reflux Disease, Adult Normally, food travels down the esophagus and stays in the stomach to be digested. However, when a person has gastroesophageal reflux disease (GERD), food and stomach acid move back up into the esophagus. When this happens, the esophagus becomes sore and inflamed. Over time, GERD can create small holes (ulcers) in the lining of the esophagus.  CAUSES This condition is caused by a problem with the muscle between the esophagus and the stomach (lower esophageal sphincter, or LES). Normally, the LES muscle closes after food passes through the esophagus to the stomach. When the LES is weakened or abnormal, it does not close properly, and that allows food and stomach acid to go back up into the esophagus. The LES can be weakened by certain dietary substances, medicines, and medical conditions, including:  Tobacco use.  Pregnancy.  Having a hiatal hernia.  Heavy alcohol use.  Certain foods and beverages, such as coffee, chocolate, onions, and peppermint. RISK FACTORS This condition is more likely to develop in:  People who have an increased body weight.  People who have connective tissue disorders.  People who use NSAID medicines. SYMPTOMS Symptoms of this condition include:  Heartburn.  Difficult or painful swallowing.  The feeling of having a lump in the throat.  Abitter taste in the mouth.  Bad breath.  Having a large amount of saliva.  Having an upset or bloated stomach.  Belching.  Chest pain.  Shortness of breath or wheezing.  Ongoing (chronic) cough or a night-time cough.  Wearing away of tooth enamel.  Weight loss. Different conditions can cause chest pain. Make sure to see your health care provider if you experience chest pain. DIAGNOSIS Your health care provider will take a medical history and perform a physical exam. To determine if you have mild or severe GERD, your health care provider may also monitor how you respond  to treatment. You may also have other tests, including:  An endoscopy toexamine your stomach and esophagus with a small camera.  A test thatmeasures the acidity level in your esophagus.  A test thatmeasures how much pressure is on your esophagus.  A barium swallow or modified barium swallow to show the shape, size, and functioning of your esophagus. TREATMENT The goal of treatment is to help relieve your symptoms and to prevent complications. Treatment for this condition may vary depending on how severe your symptoms are. Your health care provider may recommend:  Changes to your diet.  Medicine.  Surgery. HOME CARE INSTRUCTIONS Diet  Follow a diet as recommended by your health care provider. This may involve avoiding foods and drinks such as:  Coffee and tea (with or without caffeine).  Drinks that containalcohol.  Energy drinks and sports drinks.  Carbonated drinks or sodas.  Chocolate and cocoa.  Peppermint and mint flavorings.  Garlic and onions.  Horseradish.  Spicy and acidic foods, including peppers, chili powder, curry powder, vinegar, hot sauces, and barbecue sauce.  Citrus fruit juices and citrus fruits, such as oranges, lemons, and limes.  Tomato-based foods, such as red sauce, chili, salsa, and pizza with red sauce.  Fried and fatty foods, such as donuts, french fries, potato chips, and high-fat dressings.  High-fat meats, such as hot dogs and fatty cuts of red and white meats, such as rib eye steak, sausage, ham, and bacon.  High-fat dairy items, such as whole milk, butter, and cream cheese.  Eat small, frequent meals instead of large meals.  Avoid drinking large amounts of liquid with your  meals.  Avoid eating meals during the 2-3 hours before bedtime.  Avoid lying down right after you eat.  Do not exercise right after you eat. General Instructions  Pay attention to any changes in your symptoms.  Take over-the-counter and prescription  medicines only as told by your health care provider. Do not take aspirin, ibuprofen, or other NSAIDs unless your health care provider told you to do so.  Do not use any tobacco products, including cigarettes, chewing tobacco, and e-cigarettes. If you need help quitting, ask your health care provider.  Wear loose-fitting clothing. Do not wear anything tight around your waist that causes pressure on your abdomen.  Raise (elevate) the head of your bed 6 inches (15cm).  Try to reduce your stress, such as with yoga or meditation. If you need help reducing stress, ask your health care provider.  If you are overweight, reduce your weight to an amount that is healthy for you. Ask your health care provider for guidance about a safe weight loss goal.  Keep all follow-up visits as told by your health care provider. This is important. SEEK MEDICAL CARE IF:  You have new symptoms.  You have unexplained weight loss.  You have difficulty swallowing, or it hurts to swallow.  You have wheezing or a persistent cough.  Your symptoms do not improve with treatment.  You have a hoarse voice. SEEK IMMEDIATE MEDICAL CARE IF:  You have pain in your arms, neck, jaw, teeth, or back.  You feel sweaty, dizzy, or light-headed.  You have chest pain or shortness of breath.  You vomit and your vomit looks like blood or coffee grounds.  You faint.  Your stool is bloody or black.  You cannot swallow, drink, or eat.   This information is not intended to replace advice given to you by your health care provider. Make sure you discuss any questions you have with your health care provider.   Document Released: 01/06/2005 Document Revised: 12/18/2014 Document Reviewed: 07/24/2014 Elsevier Interactive Patient Education 2016 [employer]. Migraine Headache A migraine headache is an intense, throbbing pain on one or both sides of your head. A migraine can last for 30 minutes to several hours. CAUSES  The exact  cause of a migraine headache is not always known. However, a migraine may be caused when nerves in the brain become irritated and release chemicals that cause inflammation. This causes pain. Certain things may also trigger migraines, such as:  Alcohol.  Smoking.  Stress.  Menstruation.  Aged cheeses.  Foods or drinks that contain nitrates, glutamate, aspartame, or tyramine.  Lack of sleep.  Chocolate.  Caffeine.  Hunger.  Physical exertion.  Fatigue.  Medicines used to treat chest pain (nitroglycerine), birth control pills, estrogen, and some blood pressure medicines. SIGNS AND SYMPTOMS  Pain on one or both sides of your head.  Pulsating or throbbing pain.  Severe pain that prevents daily activities.  Pain that is aggravated by any physical activity.  Nausea, vomiting, or both.  Dizziness.  Pain with exposure to bright lights, loud noises, or activity.  General sensitivity to bright lights, loud noises, or smells. Before you get a migraine, you may get warning signs that a migraine is coming (aura). An aura may include:  Seeing flashing lights.  Seeing bright spots, halos, or zigzag lines.  Having tunnel vision or blurred vision.  Having feelings of numbness or tingling.  Having trouble talking.  Having muscle weakness. DIAGNOSIS  A migraine headache is often diagnosed based on:  Symptoms.  Physical exam.  A CT scan or MRI of your head. These imaging tests cannot diagnose migraines, but they can help rule out other causes of headaches. TREATMENT Medicines may be given for pain and nausea. Medicines can also be given to help prevent recurrent migraines.  HOME CARE INSTRUCTIONS  Only take over-the-counter or prescription medicines for pain or discomfort as directed by your health care provider. The use of long-term narcotics is not recommended.  Lie down in a dark, quiet room when you have a migraine.  Keep a journal to find out what may trigger  your migraine headaches. For example, write down:  What you eat and drink.  How much sleep you get.  Any change to your diet or medicines.  Limit alcohol consumption.  Quit smoking if you smoke.  Get 7-9 hours of sleep, or as recommended by your health care provider.  Limit stress.  Keep lights dim if bright lights bother you and make your migraines worse. SEEK IMMEDIATE MEDICAL CARE IF:   Your migraine becomes severe.  You have a fever.  You have a stiff neck.  You have vision loss.  You have muscular weakness or loss of muscle control.  You start losing your balance or have trouble walking.  You feel faint or pass out.  You have severe symptoms that are different from your first symptoms. MAKE SURE YOU:   Understand these instructions.  Will watch your condition.  Will get help right away if you are not doing well or get worse.   This information is not intended to replace advice given to you by your health care provider. Make sure you discuss any questions you have with your health care provider.   Document Released: 03/29/2005 Document Revised: 04/19/2014 Document Reviewed: 12/04/2012 Elsevier Interactive Patient Education [employer].

## 2015-08-23 NOTE — ED Notes (Signed)
Given PO challenge 

## 2015-08-23 NOTE — ED Notes (Signed)
Patient states she has hx of Migraine and states vomiting with migraine states took OTC medications with no relief.

## 2015-08-23 NOTE — ED Notes (Signed)
Patient left at this time with all belongings. 

## 2015-08-24 LAB — URINE CULTURE

## 2015-10-03 ENCOUNTER — Ambulatory Visit (INDEPENDENT_AMBULATORY_CARE_PROVIDER_SITE_OTHER): Payer: BLUE CROSS/BLUE SHIELD | Admitting: Family Medicine

## 2015-10-03 VITALS — BP 122/76 | HR 85 | Temp 98.2°F | Resp 17 | Ht 61.0 in | Wt 158.2 lb

## 2015-10-03 DIAGNOSIS — IMO0001 Reserved for inherently not codable concepts without codable children: Secondary | ICD-10-CM

## 2015-10-03 DIAGNOSIS — R030 Elevated blood-pressure reading, without diagnosis of hypertension: Secondary | ICD-10-CM

## 2015-10-03 DIAGNOSIS — K2100 Gastro-esophageal reflux disease with esophagitis, without bleeding: Secondary | ICD-10-CM

## 2015-10-03 DIAGNOSIS — R05 Cough: Secondary | ICD-10-CM

## 2015-10-03 DIAGNOSIS — R058 Other specified cough: Secondary | ICD-10-CM

## 2015-10-03 MED ORDER — BENZONATATE 100 MG PO CAPS: 100.0000 mg | ORAL_CAPSULE | Freq: Three times a day (TID) | ORAL | Status: DC | PRN

## 2015-10-03 MED ORDER — ALBUTEROL SULFATE HFA 108 (90 BASE) MCG/ACT IN AERS: 2.0000 | INHALATION_SPRAY | RESPIRATORY_TRACT | Status: DC | PRN

## 2015-10-03 NOTE — Progress Notes (Signed)
Subjective:    Patient ID: [name], female    DOB: [birth year], 50 y.o.   MRN: [id number]  HPI This is a 50 yo female who presents today with cough x 2 weeks. Has been taking OTC cough suppressants without relief. Chest feels tight and congested. Little sputum production. Little ear popping, some post nasal drainage, little nasal congestion. No fever. Feels fatigued. Cough keeping her awake at night. No SOB, no chest pain. Hears wheezing, worse with getting hot, lying down, over activity, talking.     No known asthma, has used inhaler in the past. Has a history of GERD and has been out of her omeprazole. She has started taking Zantac because it is less expensive.   Checks her blood pressure at her local pharmacy and it has been running 140s/90-100.   Past Medical History  Diagnosis Date  . Migraine with visual aura   . UTI (lower urinary tract infection)   . GERD (gastroesophageal reflux disease)   . Celiac disease    Past Surgical History  Procedure Laterality Date  . Tubal ligation    . Exploratory laparotomy    . Bladder suspension     Family History  Problem Relation Age of Onset  . Diabetes Son 22    type 1   Social History  Substance Use Topics  . Smoking status: Never Smoker   . Smokeless tobacco: Never Used  . Alcohol Use: No      Review of Systems Per HPI    Objective:   Physical Exam  Constitutional: She is oriented to person, place, and time. She appears well-developed and well-nourished.  HENT:  Head: Normocephalic and atraumatic.  Right Ear: Tympanic membrane, external ear and ear canal normal.  Left Ear: Tympanic membrane, external ear and ear canal normal.  Nose: Nose normal.  Mouth/Throat: Oropharynx is clear and moist.  Eyes: Conjunctivae are normal.  Cardiovascular: Normal rate, regular rhythm and normal heart sounds.   Pulmonary/Chest: Effort normal and breath sounds normal.  Musculoskeletal: Normal range of motion.  Neurological: She is  alert and oriented to person, place, and time.  Skin: Skin is warm and dry.  Psychiatric: She has a normal mood and affect. Her behavior is normal. Judgment and thought content normal.  Vitals reviewed.     BP 122/76 mmHg  Pulse 85  Temp(Src) 98.2 F (36.8 C) (Oral)  Resp 17  Ht 5\' 1"  (1.549 m)  Wt 158 lb 3.2 oz (71.759 kg)  BMI 29.91 kg/m2  SpO2 99%  LMP 09/22/2015 Wt Readings from Last 3 Encounters:  10/03/15 158 lb 3.2 oz (71.759 kg)  07/06/15 159 lb (72.122 kg)  04/25/15 165 lb (74.844 kg)       Assessment & Plan:  1. Post-viral cough syndrome - provided written and verbal information regarding diagnosis and treatment - albuterol (PROVENTIL HFA;VENTOLIN HFA) 108 (90 Base) MCG/ACT inhaler; Inhale 2 puffs into the lungs every 4 (four) hours as needed for wheezing or shortness of breath (cough, shortness of breath or wheezing.).  Dispense: 1 Inhaler; Refill: 1 - benzonatate (TESSALON) 100 MG capsule; Take 1-2 capsules (100-200 mg total) by mouth 3 (three) times daily as needed for cough.  Dispense: 40 capsule; Refill: 0 - RTC precautions reviewed  2. Gastroesophageal reflux disease with esophagitis - likely contributing to cough  3. Elevated blood pressure - reviewed blood pressure readings from last year, she had some elevated readings with ED visits (migraine, vomiting/diarrhea), non acute visit BP readings WNL -  normal labs last month - I have asked her to keep a log of her blood pressure readings and bring with her for a follow up visit in about 2 months   Clarene Reamer, FNP-BC  [facility]  10/03/2015 8:51 AM

## 2015-10-03 NOTE — Patient Instructions (Addendum)
Use your inhaler every 2 to 4 hours for the next 2 days, then every 4 hours as needed for cough, chest tightness.   Please come back in if you are not better in 1 week or sooner if you get worse, run a fever or develop shortness of breath    IF you received an x-ray today, you will receive an invoice from [facility]. Please contact [facility] at [phone] with questions or concerns regarding your invoice.   IF you received labwork today, you will receive an invoice from [facility]. Please contact [facility] at [phone] with questions or concerns regarding your invoice.   Our billing staff will not be able to assist you with questions regarding bills from these companies.  You will be contacted with the lab results as soon as they are available. The fastest way to get your results is to activate your My Chart account. Instructions are located on the last page of this paperwork. If you have not heard from us regarding the results in 2 weeks, please contact this office.     Cough, Adult Coughing is a reflex that clears your throat and your airways. Coughing helps to heal and protect your lungs. It is normal to cough occasionally, but a cough that happens with other symptoms or lasts a long time may be a sign of a condition that needs treatment. A cough may last only 2-3 weeks (acute), or it may last longer than 8 weeks (chronic). CAUSES Coughing is commonly caused by:  Breathing in substances that irritate your lungs.  A viral or bacterial respiratory infection.  Allergies.  Asthma.  Postnasal drip.  Smoking.  Acid backing up from the stomach into the esophagus (gastroesophageal reflux).  Certain medicines.  Chronic lung problems, including COPD (or rarely, lung cancer).  Other medical conditions such as heart failure. HOME CARE INSTRUCTIONS  Pay attention to any changes in your symptoms. Take these actions to help with your  discomfort:  Take medicines only as told by your health care provider.  If you were prescribed an antibiotic medicine, take it as told by your health care provider. Do not stop taking the antibiotic even if you start to feel better.  Talk with your health care provider before you take a cough suppressant medicine.  Drink enough fluid to keep your urine clear or pale yellow.  If the air is dry, use a cold steam vaporizer or humidifier in your bedroom or your home to help loosen secretions.  Avoid anything that causes you to cough at work or at home.  If your cough is worse at night, try sleeping in a semi-upright position.  Avoid cigarette smoke. If you smoke, quit smoking. If you need help quitting, ask your health care provider.  Avoid caffeine.  Avoid alcohol.  Rest as needed. SEEK MEDICAL CARE IF:   You have new symptoms.  You cough up pus.  Your cough does not get better after 2-3 weeks, or your cough gets worse.  You cannot control your cough with suppressant medicines and you are losing sleep.  You develop pain that is getting worse or pain that is not controlled with pain medicines.  You have a fever.  You have unexplained weight loss.  You have night sweats. SEEK IMMEDIATE MEDICAL CARE IF:  You cough up blood.  You have difficulty breathing.  Your heartbeat is very fast.   This information is not intended to replace advice given to you by your health  care provider. Make sure you discuss any questions you have with your health care provider.   Document Released: 09/25/2010 Document Revised: 12/18/2014 Document Reviewed: 06/05/2014 Elsevier Interactive Patient Education [employer].

## 2016-04-10 ENCOUNTER — Encounter (HOSPITAL_COMMUNITY): Payer: Self-pay

## 2016-04-10 ENCOUNTER — Emergency Department (HOSPITAL_COMMUNITY): Payer: BLUE CROSS/BLUE SHIELD

## 2016-04-10 DIAGNOSIS — R200 Anesthesia of skin: Secondary | ICD-10-CM | POA: Diagnosis not present

## 2016-04-10 DIAGNOSIS — Z5181 Encounter for therapeutic drug level monitoring: Secondary | ICD-10-CM | POA: Insufficient documentation

## 2016-04-10 DIAGNOSIS — E876 Hypokalemia: Secondary | ICD-10-CM | POA: Insufficient documentation

## 2016-04-10 DIAGNOSIS — I10 Essential (primary) hypertension: Secondary | ICD-10-CM | POA: Insufficient documentation

## 2016-04-10 DIAGNOSIS — Z79899 Other long term (current) drug therapy: Secondary | ICD-10-CM | POA: Insufficient documentation

## 2016-04-10 DIAGNOSIS — R51 Headache: Secondary | ICD-10-CM | POA: Diagnosis present

## 2016-04-10 LAB — COMPREHENSIVE METABOLIC PANEL
ALT: 16 U/L (ref 14–54)
AST: 21 U/L (ref 15–41)
Albumin: 3.2 g/dL — ABNORMAL LOW (ref 3.5–5.0)
Alkaline Phosphatase: 59 U/L (ref 38–126)
Anion gap: 9 (ref 5–15)
BUN: 13 mg/dL (ref 6–20)
CO2: 23 mmol/L (ref 22–32)
Calcium: 9.1 mg/dL (ref 8.9–10.3)
Chloride: 108 mmol/L (ref 101–111)
Creatinine, Ser: 0.62 mg/dL (ref 0.44–1.00)
GFR calc Af Amer: >60 mL/min (ref >60)
GFR calc non Af Amer: >60 mL/min (ref >60)
Glucose, Bld: 117 mg/dL — ABNORMAL HIGH (ref 65–99)
Potassium: 3.4 mmol/L — ABNORMAL LOW (ref 3.5–5.1)
Sodium: 140 mmol/L (ref 135–145)
Total Bilirubin: 0.3 mg/dL (ref 0.3–1.2)
Total Protein: 6.4 g/dL — ABNORMAL LOW (ref 6.5–8.1)

## 2016-04-10 LAB — DIFFERENTIAL
Basophils Absolute: 0 10*3/uL (ref 0.0–0.1)
Basophils Relative: 1 %
Eosinophils Absolute: 0.3 10*3/uL (ref 0.0–0.7)
Eosinophils Relative: 5 %
Lymphocytes Relative: 24 %
Lymphs Abs: 1.2 10*3/uL (ref 0.7–4.0)
Monocytes Absolute: 0.6 10*3/uL (ref 0.1–1.0)
Monocytes Relative: 12 %
Neutro Abs: 3.1 10*3/uL (ref 1.7–7.7)
Neutrophils Relative %: 58 %

## 2016-04-10 LAB — I-STAT CHEM 8, ED
BUN: 14 mg/dL (ref 6–20)
Calcium, Ion: 1.27 mmol/L (ref 1.15–1.40)
Chloride: 105 mmol/L (ref 101–111)
Creatinine, Ser: 0.7 mg/dL (ref 0.44–1.00)
Glucose, Bld: 118 mg/dL — ABNORMAL HIGH (ref 65–99)
HCT: 34 % — ABNORMAL LOW (ref 36.0–46.0)
Hemoglobin: 11.6 g/dL — ABNORMAL LOW (ref 12.0–15.0)
Potassium: 3.4 mmol/L — ABNORMAL LOW (ref 3.5–5.1)
Sodium: 143 mmol/L (ref 135–145)
TCO2: 28 mmol/L (ref 0–100)

## 2016-04-10 LAB — CBC
HCT: 35.4 % — ABNORMAL LOW (ref 36.0–46.0)
Hemoglobin: 11.3 g/dL — ABNORMAL LOW (ref 12.0–15.0)
MCH: 27.6 pg (ref 26.0–34.0)
MCHC: 31.9 g/dL (ref 30.0–36.0)
MCV: 86.6 fL (ref 78.0–100.0)
Platelets: 265 10*3/uL (ref 150–400)
RBC: 4.09 MIL/uL (ref 3.87–5.11)
RDW: 14.6 % (ref 11.5–15.5)
WBC: 5.2 10*3/uL (ref 4.0–10.5)

## 2016-04-10 LAB — APTT: aPTT: 27 seconds (ref 24–36)

## 2016-04-10 LAB — PROTIME-INR
INR: 0.94
Prothrombin Time: 12.6 seconds (ref 11.4–15.2)

## 2016-04-10 LAB — TROPONIN I: Troponin I: <0.03 ng/mL (ref <0.03)

## 2016-04-10 NOTE — ED Triage Notes (Signed)
Pt states she had a knot on her head yesterday but had no soreness but today the right side of her head and face has been sore, tender, and right facial numbness that began this morning at 0600. Pt also endorses right ear ache that began this evening. LSN 0600 this morning. Pt hypertensive in triage. No facial asymmetry, grips equal, no gait problems, no slurred speech. NIH 1 for right sided facial numbness.

## 2016-04-11 ENCOUNTER — Emergency Department (HOSPITAL_COMMUNITY)
Admission: EM | Admit: 2016-04-11 | Discharge: 2016-04-11 | Disposition: A | Discharge status: Home / Self Care | Payer: BLUE CROSS/BLUE SHIELD | Attending: Emergency Medicine | Admitting: Emergency Medicine

## 2016-04-11 DIAGNOSIS — R519 Headache, unspecified: Secondary | ICD-10-CM

## 2016-04-11 DIAGNOSIS — R51 Headache: Secondary | ICD-10-CM

## 2016-04-11 DIAGNOSIS — R200 Anesthesia of skin: Secondary | ICD-10-CM

## 2016-04-11 DIAGNOSIS — E876 Hypokalemia: Secondary | ICD-10-CM

## 2016-04-11 MED ORDER — POTASSIUM CHLORIDE CRYS ER 20 MEQ PO TBCR
40.0000 meq | EXTENDED_RELEASE_TABLET | Freq: Once | ORAL | Status: AC
  Administered 2016-04-11: 40 meq via ORAL
  Filled 2016-04-11: qty 2

## 2016-04-11 NOTE — ED Provider Notes (Signed)
[facility] DEPT Provider Note   CSN: [id number] Arrival date & time: 04/10/16  2211     History   Chief Complaint Chief Complaint  Patient presents with  . Numbness  . Headache  . Stroke Symptoms    HPI [name] is a 50 y.o. female with a hx of celiac disease, GERD, HTN, migraine headache presents to the Emergency Department complaining of gradual, persistent, progressively worsening right sided scalp pain and right sided facial numbness onset around 6am this morning. Pt reports that last night she had a "lump" on the left scalp that resolved spontaneously, but when she awoke this morning she had the right sided pain an numbness.  She denies paresthesias or numbness anywhere else, weakness, gait disturbance, vision changes, rash, abd pain, CP, N/V/D.  Associated symptoms include right ear pain.  She denies nasal congestion, headache.  No treatments PTA.  Nothing makes it better and nothing makes it worse.  Pt denies fever, chills, neck pain.     The history is provided by the patient, a parent and medical records. No language interpreter was used.    Past Medical History:  Diagnosis Date  . Celiac disease   . GERD (gastroesophageal reflux disease)   . Hypertension   . Migraine with visual aura   . UTI (lower urinary tract infection)     Patient Active Problem List   Diagnosis Date Noted  . BV (bacterial vaginosis) 11/27/2012  . UTI (lower urinary tract infection) 11/27/2012  . Migraine with visual aura 09/19/2011  . Depression 09/19/2011  . Constipation 06/25/2011    Past Surgical History:  Procedure Laterality Date  . BLADDER SUSPENSION    . EXPLORATORY LAPAROTOMY    . TUBAL LIGATION      OB History    No data available       Home Medications    Prior to Admission medications   Not on File    Family History Family History  Problem Relation Age of Onset  . Diabetes Son 41    type 1    Social History Social History  Substance Use Topics    . Smoking status: Never Smoker  . Smokeless tobacco: Never Used  . Alcohol use No     Allergies   Gluten meal and Ondansetron   Review of Systems Review of Systems  HENT: Positive for ear pain ( right).   Neurological: Positive for numbness (right side of scalp/face) and headaches ( scalp pain).  All other systems reviewed and are negative.    Physical Exam Updated Vital Signs BP 143/91   Pulse 83   Temp 98.9 F (37.2 C)   Resp 17   Ht 5' (1.524 m)   Wt 74.1 kg   LMP 03/24/2016 (Approximate)   SpO2 98%   BMI 31.89 kg/m   Physical Exam  Constitutional: She is oriented to person, place, and time. She appears well-developed and well-nourished. No distress.  Awake, alert, nontoxic appearance  HENT:  Head: Normocephalic and atraumatic.    Mouth/Throat: Oropharynx is clear and moist. No oropharyngeal exudate.  Eyes: Conjunctivae and EOM are normal. Pupils are equal, round, and reactive to light. No scleral icterus.  No horizontal, vertical or rotational nystagmus  Neck: Normal range of motion. Neck supple.  Full active and passive ROM without pain No midline or paraspinal tenderness No nuchal rigidity or meningeal signs  Cardiovascular: Normal rate, regular rhythm and intact distal pulses.   Pulmonary/Chest: Effort normal and breath sounds normal. No respiratory  distress. She has no wheezes. She has no rales.  Equal chest expansion  Abdominal: Soft. Bowel sounds are normal. She exhibits no mass. There is no tenderness. There is no rebound and no guarding.  Musculoskeletal: Normal range of motion. She exhibits no edema.  Lymphadenopathy:    She has no cervical adenopathy.  Neurological: She is alert and oriented to person, place, and time. No cranial nerve deficit. She exhibits normal muscle tone. Coordination normal.  Mental Status:  Alert, oriented, thought content appropriate. Speech fluent without evidence of aphasia. Able to follow 2 step commands without  difficulty.  Cranial Nerves:  II:  Peripheral visual fields grossly normal, pupils equal, round, reactive to light III,IV, VI: ptosis not present, extra-ocular motions intact bilaterally  V,VII: smile symmetric, facial light touch sensation equal VIII: hearing grossly normal bilaterally  IX,X: midline uvula rise  XI: bilateral shoulder shrug equal and strong XII: midline tongue extension  Motor:  5/5 in upper and lower extremities bilaterally including strong and equal grip strength and dorsiflexion/plantar flexion Sensory: Pinprick and light touch normal in all extremities.  Cerebellar: normal finger-to-nose with bilateral upper extremities Gait: normal gait and balance CV: distal pulses palpable throughout   Skin: Skin is warm and dry. No rash noted. She is not diaphoretic.  Psychiatric: She has a normal mood and affect. Her behavior is normal. Judgment and thought content normal.  Nursing note and vitals reviewed.    ED Treatments / Results  Labs (all labs ordered are listed, but only abnormal results are displayed) Labs Reviewed  CBC - Abnormal; Notable for the following:       Result Value   Hemoglobin 11.3 (*)    HCT 35.4 (*)    All other components within normal limits  COMPREHENSIVE METABOLIC PANEL - Abnormal; Notable for the following:    Potassium 3.4 (*)    Glucose, Bld 117 (*)    Total Protein 6.4 (*)    Albumin 3.2 (*)    All other components within normal limits  I-STAT CHEM 8, ED - Abnormal; Notable for the following:    Potassium 3.4 (*)    Glucose, Bld 118 (*)    Hemoglobin 11.6 (*)    HCT 34.0 (*)    All other components within normal limits  PROTIME-INR  APTT  DIFFERENTIAL  TROPONIN I    EKG  EKG Interpretation  Date/Time:  Saturday April 10 2016 22:31:16 EST Ventricular Rate:  80 PR Interval:  164 QRS Duration: 86 QT Interval:  412 QTC Calculation: 475 R Axis:   70 Text Interpretation:  Normal sinus rhythm Normal ECG No acute changes No  significant change since last tracing Confirmed by Kathrynn Humble, MD, Thelma Comp [phone]) on 04/11/2016 1:34:47 AM       Radiology Ct Head Wo Contrast  Result Date: 04/10/2016 CLINICAL DATA:  Patient with facial numbness. History of migraine headaches. EXAM: CT HEAD WITHOUT CONTRAST TECHNIQUE: Contiguous axial images were obtained from the base of the skull through the vertex without intravenous contrast. COMPARISON:  Brain CT 09/20/2014. FINDINGS: Brain: No evidence of acute infarction, hemorrhage, hydrocephalus, extra-axial collection or mass lesion/mass effect. Vascular: No hyperdense vessel or unexpected calcification. Skull: Intact. Sinuses/Orbits: Circumferential mucosal thickening involving the maxillary sinuses. Remainder of the paranasal sinuses are unremarkable. Mastoid air cells are unremarkable. Orbits are unremarkable. Other: None. IMPRESSION: No acute intracranial process. Maxillary sinus mucosal thickening as can be seen with sinusitis. Electronically Signed   By: Lovey Newcomer M.D.   On: 04/10/2016 22:57  Procedures Procedures (including critical care time)  Medications Ordered in ED Medications  potassium chloride SA (K-DUR,KLOR-CON) CR tablet 40 mEq (40 mEq Oral Given 04/11/16 0255)     Initial Impression / Assessment and Plan / ED Course  I have reviewed the triage vital signs and the nursing notes.  Pertinent labs & imaging results that were available during my care of the patient were reviewed by me and considered in my medical decision making (see chart for details).  Clinical Course as of Apr 11 336  Sun Apr 11, 2016  0335 Replaced in the ED Potassium: (!) 3.4 [HM]  0335 Negative Troponin I: <0.03 [HM]  0336 Hemoglobin: (!) 11.3 [HM]  0336 Slightly below baseline. Patient denies melena or hematochezia. No palpitations, chest pain or shortness of breath.  [HM]  0336 No acute intercranial process. CT Head Wo Contrast [HM]  0336 Sinus rhythm without acute changes. ED EKG  [HM]    Clinical Course User Index [HM] Abigail Butts, PA-C    Patient presents with greater than 12 hours of right-sided scalp pain and numbness. Neurologic exam otherwise unremarkable. She is well appearing. No lesions have appeared to suggest shingles. Concern for possible trigeminal neuralgia however symptoms are somewhat inconsistent. Highly doubt giant cell arteritis at this time. Skin is without vision changes or acute headache. Vital signs are stable. Patient with hypertension upon arrival to the emergency department but no chest pain or shortness of breath. Blood pressure has stabilized without intervention. Highly doubt CVA. Discussed concerns and need for neurology follow-up. Also discussed reasons to return to the emergency department. Patient and mother state understanding and are in agreement with the plan.  The patient was discussed with and seen by Dr. Kathrynn Humble who agrees with the treatment plan.   Final Clinical Impressions(s) / ED Diagnoses   Final diagnoses:  Numbness  Scalp pain  Hypokalemia    New Prescriptions Current Discharge Medication List       Abigail Butts, PA-C 04/11/16 [place], MD 04/11/16 2339

## 2016-04-11 NOTE — Discharge Instructions (Signed)
1. Medications: usual home medications 2. Treatment: rest, drink plenty of fluids,  3. Follow Up: Please followup with neurology this week days for discussion of your diagnoses and further evaluation after today's visit; if you do not have a primary care doctor use the resource guide provided to find one; Please return to the ER for her sitting symptoms, development of headache, vision changes or other concerns.

## 2016-04-11 NOTE — ED Notes (Signed)
Patient left at this time with all belongings. 

## 2016-07-13 ENCOUNTER — Ambulatory Visit: Payer: BLUE CROSS/BLUE SHIELD

## 2016-07-13 ENCOUNTER — Ambulatory Visit (HOSPITAL_COMMUNITY)
Admission: EM | Admit: 2016-07-13 | Discharge: 2016-07-13 | Disposition: A | Discharge status: Home / Self Care | Payer: BLUE CROSS/BLUE SHIELD | Attending: Family Medicine | Admitting: Family Medicine

## 2016-07-13 ENCOUNTER — Encounter (HOSPITAL_COMMUNITY): Payer: Self-pay | Admitting: Emergency Medicine

## 2016-07-13 DIAGNOSIS — R519 Headache, unspecified: Secondary | ICD-10-CM

## 2016-07-13 DIAGNOSIS — N3000 Acute cystitis without hematuria: Secondary | ICD-10-CM | POA: Insufficient documentation

## 2016-07-13 DIAGNOSIS — R51 Headache: Secondary | ICD-10-CM | POA: Insufficient documentation

## 2016-07-13 DIAGNOSIS — R300 Dysuria: Secondary | ICD-10-CM | POA: Diagnosis present

## 2016-07-13 LAB — POCT URINALYSIS DIP (DEVICE)
Bilirubin Urine: NEGATIVE
Glucose, UA: NEGATIVE mg/dL
Ketones, ur: NEGATIVE mg/dL
Leukocytes, UA: NEGATIVE
Nitrite: POSITIVE — AB
Protein, ur: NEGATIVE mg/dL
Specific Gravity, Urine: 1.02 (ref 1.005–1.030)
Urobilinogen, UA: 0.2 mg/dL (ref 0.0–1.0)
pH: 6 (ref 5.0–8.0)

## 2016-07-13 MED ORDER — KETOROLAC TROMETHAMINE 30 MG/ML IJ SOLN
30.0000 mg | Freq: Once | INTRAMUSCULAR | Status: AC
  Administered 2016-07-13: 30 mg via INTRAMUSCULAR

## 2016-07-13 MED ORDER — CEPHALEXIN 500 MG PO CAPS
500.0000 mg | ORAL_CAPSULE | Freq: Four times a day (QID) | ORAL | 0 refills | Status: DC
Start: 2016-07-13 — End: 2016-10-19

## 2016-07-13 MED ORDER — PROMETHAZINE HCL 25 MG PO TABS: 25.0000 mg | ORAL_TABLET | Freq: Four times a day (QID) | ORAL | 0 refills | Status: DC | PRN

## 2016-07-13 MED ORDER — PHENAZOPYRIDINE HCL 200 MG PO TABS: 200.0000 mg | ORAL_TABLET | Freq: Three times a day (TID) | ORAL | 0 refills | Status: DC | PRN

## 2016-07-13 MED ORDER — DEXAMETHASONE SODIUM PHOSPHATE 10 MG/ML IJ SOLN
10.0000 mg | Freq: Once | INTRAMUSCULAR | Status: AC
  Administered 2016-07-13: 10 mg via INTRAMUSCULAR

## 2016-07-13 MED ORDER — DEXAMETHASONE SODIUM PHOSPHATE 10 MG/ML IJ SOLN
INTRAMUSCULAR | Status: AC
  Filled 2016-07-13: qty 1

## 2016-07-13 MED ORDER — KETOROLAC TROMETHAMINE 30 MG/ML IJ SOLN
INTRAMUSCULAR | Status: AC
  Filled 2016-07-13: qty 1

## 2016-07-13 NOTE — ED Provider Notes (Signed)
CSN: [id number]     Arrival date & time 07/13/16  1350 History   First MD Initiated Contact with Patient 07/13/16 1427     Chief Complaint  Patient presents with  . Dysuria   (Consider location/radiation/quality/duration/timing/severity/associated sxs/prior Treatment) 51 year old female presents to clinic for dysuria, and for headache.   The history is provided by the patient.  Dysuria  Pain quality:  Aching and burning Pain severity:  Moderate Onset quality:  Gradual Duration:  1 week Timing:  Constant Progression:  Worsening Chronicity:  Recurrent Recent urinary tract infections: yes   Ineffective treatments:  Acetaminophen Urinary symptoms: discolored urine, foul-smelling urine and frequent urination   Urinary symptoms: no bladder incontinence   Associated symptoms: abdominal pain, fever, flank pain and nausea   Associated symptoms: no vaginal discharge and no vomiting   Headache  Pain location:  Generalized Quality:  Dull Radiates to:  Does not radiate Severity currently:  8/10 Severity at highest:  9/10 Onset quality:  Gradual Duration:  3 days Timing:  Constant Progression:  Worsening Chronicity:  Chronic Similar to prior headaches: yes   Context: activity, bright light and loud noise   Relieved by:  Nothing Worsened by:  Nothing Ineffective treatments:  Acetaminophen Associated symptoms: abdominal pain, back pain, fever and nausea   Associated symptoms: no cough, no dizziness, no neck pain, no neck stiffness and no vomiting     Past Medical History:  Diagnosis Date  . Celiac disease   . GERD (gastroesophageal reflux disease)   . Hypertension   . Migraine with visual aura   . UTI (lower urinary tract infection)    Past Surgical History:  Procedure Laterality Date  . BLADDER SUSPENSION    . EXPLORATORY LAPAROTOMY    . TUBAL LIGATION     Family History  Problem Relation Age of Onset  . Diabetes Son 70    type 1   Social History  Substance Use  Topics  . Smoking status: Never Smoker  . Smokeless tobacco: Never Used  . Alcohol use No   OB History    No data available     Review of Systems  Constitutional: Positive for chills and fever.  HENT: Negative.   Respiratory: Negative for cough and shortness of breath.   Cardiovascular: Negative for chest pain and palpitations.  Gastrointestinal: Positive for abdominal pain and nausea. Negative for vomiting.  Genitourinary: Positive for dysuria, flank pain and frequency. Negative for urgency and vaginal discharge.  Musculoskeletal: Positive for back pain. Negative for neck pain and neck stiffness.  Neurological: Positive for headaches. Negative for dizziness.    Allergies  Gluten meal and Ondansetron  Home Medications   Prior to Admission medications   Medication Sig Start Date End Date Taking? Authorizing Provider  cephALEXin (KEFLEX) 500 MG capsule Take 1 capsule (500 mg total) by mouth 4 (four) times daily. 07/13/16   Barnet Glasgow, NP  phenazopyridine (PYRIDIUM) 200 MG tablet Take 1 tablet (200 mg total) by mouth 3 (three) times daily as needed for pain. 07/13/16   Barnet Glasgow, NP  promethazine (PHENERGAN) 25 MG tablet Take 1 tablet (25 mg total) by mouth every 6 (six) hours as needed for nausea or vomiting. 07/13/16   Barnet Glasgow, NP   Meds Ordered and Administered this Visit   Medications  ketorolac (TORADOL) 30 MG/ML injection 30 mg (30 mg Intramuscular Given 07/13/16 1450)  dexamethasone (DECADRON) injection 10 mg (10 mg Intramuscular Given 07/13/16 1451)    BP (!) 155/98 (BP Location:  Right Arm) Comment: notified cma  Pulse 74   Temp 98.6 F (37 C) (Oral)   Resp 16   LMP 07/13/2016 (Exact Date)   SpO2 100%  No data found.   Physical Exam  Constitutional: She is oriented to person, place, and time. She appears well-developed and well-nourished. No distress.  HENT:  Head: Normocephalic and atraumatic.  Eyes: Pupils are equal, round, and reactive to light.   Neck: Normal range of motion.  Cardiovascular: Normal rate and regular rhythm.   Pulmonary/Chest: Effort normal and breath sounds normal.  Abdominal: Soft. Bowel sounds are normal. She exhibits no distension and no mass. There is no tenderness. There is no guarding and no CVA tenderness.  Neurological: She is alert and oriented to person, place, and time.  Skin: Skin is warm and dry. Capillary refill takes less than 2 seconds. She is not diaphoretic.  Psychiatric: She has a normal mood and affect. Her behavior is normal.  Nursing note and vitals reviewed.   Urgent Care Course     Procedures (including critical care time)  Labs Review Labs Reviewed  URINE CULTURE    Imaging Review No results found.     MDM   1. Acute cystitis without hematuria   2. Nonintractable headache, unspecified chronicity pattern, unspecified headache type    Treating for urinary tract infection, given prescription for Keflex, and Pyridium. Urine sent for culture. For headache, given an injection of Toradol, dexamethasone in clinic, Phenergan for nausea.     Barnet Glasgow, NP 07/13/16 1505

## 2016-07-13 NOTE — ED Triage Notes (Signed)
The patient presented to the [facility] with a complaint of a vaginal/urinary odor as well as dysuria x "several" months. The patient also stated her BP has been elevated.

## 2016-07-13 NOTE — Discharge Instructions (Signed)
You are being treated today for a urinary tract infection. I have prescribed Keflex, take 1 tablet 4 times a day for 5 days. I have also prescribed Pyridium. Take 1 tablet a day 3 times a day for 2 days. Your urine will be sent for culture and you will be notified should any change in therapy be needed. Drink plenty of fluids and rest. For your nausea, you have been given a prescription for phenergan, this medicine may cause drowsiness, do not drive while taking.   You've been given an injection of Toradol, and dexamethasone in clinic today. He may take over-the-counter Tylenol 1 tablet every 4 hours as needed not to exceed 4000 mg in any 24-hour period   Should your symptoms fail to resolve, follow up with your primary care provider or return to clinic.

## 2016-07-15 LAB — URINE CULTURE: Culture: >=100000 — AB

## 2016-10-19 ENCOUNTER — Encounter (HOSPITAL_COMMUNITY): Payer: Self-pay

## 2016-10-19 ENCOUNTER — Emergency Department (HOSPITAL_COMMUNITY)
Admission: EM | Admit: 2016-10-19 | Discharge: 2016-10-19 | Disposition: A | Discharge status: Home / Self Care | Payer: BLUE CROSS/BLUE SHIELD | Attending: Emergency Medicine | Admitting: Emergency Medicine

## 2016-10-19 DIAGNOSIS — R197 Diarrhea, unspecified: Secondary | ICD-10-CM | POA: Diagnosis not present

## 2016-10-19 DIAGNOSIS — I10 Essential (primary) hypertension: Secondary | ICD-10-CM | POA: Insufficient documentation

## 2016-10-19 DIAGNOSIS — N390 Urinary tract infection, site not specified: Secondary | ICD-10-CM | POA: Diagnosis not present

## 2016-10-19 DIAGNOSIS — R109 Unspecified abdominal pain: Secondary | ICD-10-CM | POA: Diagnosis present

## 2016-10-19 LAB — URINALYSIS, ROUTINE W REFLEX MICROSCOPIC
Bilirubin Urine: NEGATIVE
Glucose, UA: NEGATIVE mg/dL
Ketones, ur: NEGATIVE mg/dL
Nitrite: POSITIVE — AB
Protein, ur: NEGATIVE mg/dL
Specific Gravity, Urine: 1.021 (ref 1.005–1.030)
pH: 5 (ref 5.0–8.0)

## 2016-10-19 LAB — COMPREHENSIVE METABOLIC PANEL
ALT: 18 U/L (ref 14–54)
AST: 21 U/L (ref 15–41)
Albumin: 3.8 g/dL (ref 3.5–5.0)
Alkaline Phosphatase: 80 U/L (ref 38–126)
Anion gap: 8 (ref 5–15)
BUN: 12 mg/dL (ref 6–20)
CO2: 25 mmol/L (ref 22–32)
Calcium: 8.8 mg/dL — ABNORMAL LOW (ref 8.9–10.3)
Chloride: 104 mmol/L (ref 101–111)
Creatinine, Ser: 0.77 mg/dL (ref 0.44–1.00)
GFR calc Af Amer: >60 mL/min (ref >60)
GFR calc non Af Amer: >60 mL/min (ref >60)
Glucose, Bld: 87 mg/dL (ref 65–99)
Potassium: 3.4 mmol/L — ABNORMAL LOW (ref 3.5–5.1)
Sodium: 137 mmol/L (ref 135–145)
Total Bilirubin: 0.8 mg/dL (ref 0.3–1.2)
Total Protein: 7.2 g/dL (ref 6.5–8.1)

## 2016-10-19 LAB — I-STAT BETA HCG BLOOD, ED (MC, WL, AP ONLY): I-stat hCG, quantitative: <5 m[IU]/mL (ref <5)

## 2016-10-19 LAB — CBC
HCT: 40.6 % (ref 36.0–46.0)
Hemoglobin: 13.6 g/dL (ref 12.0–15.0)
MCH: 29.3 pg (ref 26.0–34.0)
MCHC: 33.5 g/dL (ref 30.0–36.0)
MCV: 87.5 fL (ref 78.0–100.0)
Platelets: 236 10*3/uL (ref 150–400)
RBC: 4.64 MIL/uL (ref 3.87–5.11)
RDW: 14.6 % (ref 11.5–15.5)
WBC: 5.6 10*3/uL (ref 4.0–10.5)

## 2016-10-19 LAB — LIPASE, BLOOD: Lipase: 21 U/L (ref 11–51)

## 2016-10-19 MED ORDER — CEPHALEXIN 500 MG PO CAPS: 500.0000 mg | ORAL_CAPSULE | Freq: Four times a day (QID) | ORAL | 0 refills | Status: DC

## 2016-10-19 MED ORDER — SODIUM CHLORIDE 0.9 % IV BOLUS (SEPSIS)
1000.0000 mL | Freq: Once | INTRAVENOUS | Status: AC
  Administered 2016-10-19: 1000 mL via INTRAVENOUS

## 2016-10-19 MED ORDER — DICYCLOMINE HCL 10 MG PO CAPS
10.0000 mg | ORAL_CAPSULE | Freq: Once | ORAL | Status: AC
  Administered 2016-10-19: 10 mg via ORAL
  Filled 2016-10-19: qty 1

## 2016-10-19 MED ORDER — DICYCLOMINE HCL 20 MG PO TABS: 20.0000 mg | ORAL_TABLET | Freq: Three times a day (TID) | ORAL | 0 refills | Status: DC | PRN

## 2016-10-19 MED ORDER — PROMETHAZINE HCL 25 MG/ML IJ SOLN
12.5000 mg | Freq: Once | INTRAMUSCULAR | Status: AC
  Administered 2016-10-19: 12.5 mg via INTRAVENOUS
  Filled 2016-10-19: qty 1

## 2016-10-19 NOTE — ED Provider Notes (Signed)
[facility] DEPT Provider Note   CSN: [id number] Arrival date & time: 10/19/16  0950     History   Chief Complaint Chief Complaint  Patient presents with  . Abdominal Pain  . Diarrhea    HPI Matteson Blue is a 51 y.o. female.  HPI Patient presents with nausea vomiting diarrhea for last 3 days. Has a history of chronic nausea and some chronic emesis. States she has had more diarrhea here. States his been crampy. States it has gotten severe and there could be visual cramps in her abdomen. No fevers. Has had some darkening of her urine without dysuria. No sick contacts. Pain goes to the back.   Past Medical History:  Diagnosis Date  . Celiac disease   . GERD (gastroesophageal reflux disease)   . Hypertension   . Migraine with visual aura   . UTI (lower urinary tract infection)     Patient Active Problem List   Diagnosis Date Noted  . BV (bacterial vaginosis) 11/27/2012  . UTI (lower urinary tract infection) 11/27/2012  . Migraine with visual aura 09/19/2011  . Depression 09/19/2011  . Constipation 06/25/2011    Past Surgical History:  Procedure Laterality Date  . BLADDER SUSPENSION    . EXPLORATORY LAPAROTOMY    . TUBAL LIGATION      OB History    No data available       Home Medications    Prior to Admission medications   Medication Sig Start Date End Date Taking? Authorizing Provider  cephALEXin (KEFLEX) 500 MG capsule Take 1 capsule (500 mg total) by mouth 4 (four) times daily. 10/19/16   Davonna Belling, MD  dicyclomine (BENTYL) 20 MG tablet Take 1 tablet (20 mg total) by mouth 3 (three) times daily as needed for spasms. 10/19/16   Davonna Belling, MD  phenazopyridine (PYRIDIUM) 200 MG tablet Take 1 tablet (200 mg total) by mouth 3 (three) times daily as needed for pain. Patient not taking: Reported on 10/19/2016 07/13/16   Barnet Glasgow, NP  promethazine (PHENERGAN) 25 MG tablet Take 1 tablet (25 mg total) by mouth every 6 (six) hours as needed for  nausea or vomiting. Patient not taking: Reported on 10/19/2016 07/13/16   Barnet Glasgow, NP    Family History Family History  Problem Relation Age of Onset  . Diabetes Son 77       type 1    Social History Social History  Substance Use Topics  . Smoking status: Never Smoker  . Smokeless tobacco: Never Used  . Alcohol use No     Allergies   Gluten meal and Ondansetron   Review of Systems Review of Systems  Constitutional: Positive for appetite change. Negative for fever.  HENT: Negative for congestion.   Eyes: Negative for photophobia.  Respiratory: Negative for shortness of breath.   Cardiovascular: Negative for chest pain.  Gastrointestinal: Positive for abdominal pain, diarrhea, nausea and vomiting.  Endocrine: Negative for polyuria.  Genitourinary: Negative for flank pain.  Musculoskeletal: Negative for back pain.  Skin: Negative for rash.  Neurological: Negative for seizures.  Hematological: Negative for adenopathy.  Psychiatric/Behavioral: Negative for confusion.     Physical Exam Updated Vital Signs BP (!) 143/90 (BP Location: Right Arm)   Pulse 75   Temp 98.8 F (37.1 C) (Oral)   Resp 16   Ht 5' (1.524 m)   Wt 72.6 kg (160 lb)   LMP 10/19/2016   SpO2 100%   BMI 31.25 kg/m   Physical Exam  Constitutional:  She appears well-developed.  HENT:  Head: Atraumatic.  Eyes: Pupils are equal, round, and reactive to light.  Neck: Neck supple.  Cardiovascular: Normal rate.   Pulmonary/Chest: Effort normal.  Abdominal: There is no tenderness.  Musculoskeletal: She exhibits no edema.  Neurological: She is alert.  Skin: Skin is warm. Capillary refill takes less than 2 seconds.     ED Treatments / Results  Labs (all labs ordered are listed, but only abnormal results are displayed) Labs Reviewed  COMPREHENSIVE METABOLIC PANEL - Abnormal; Notable for the following:       Result Value   Potassium 3.4 (*)    Calcium 8.8 (*)    All other components  within normal limits  URINALYSIS, ROUTINE W REFLEX MICROSCOPIC - Abnormal; Notable for the following:    APPearance HAZY (*)    Hgb urine dipstick MODERATE (*)    Nitrite POSITIVE (*)    Leukocytes, UA LARGE (*)    Bacteria, UA MANY (*)    Squamous Epithelial / LPF 0-5 (*)    All other components within normal limits  URINE CULTURE  LIPASE, BLOOD  CBC  I-STAT BETA HCG BLOOD, ED ([facility], [facility], [facility] ONLY)    EKG  EKG Interpretation None       Radiology No results found.  Procedures Procedures (including critical care time)  Medications Ordered in ED Medications  sodium chloride 0.9 % bolus 1,000 mL (1,000 mLs Intravenous New Bag/Given 10/19/16 1305)  promethazine (PHENERGAN) injection 12.5 mg (12.5 mg Intravenous Given 10/19/16 1305)  dicyclomine (BENTYL) capsule 10 mg (10 mg Oral Given 10/19/16 1306)     Initial Impression / Assessment and Plan / ED Course  I have reviewed the triage vital signs and the nursing notes.  Pertinent labs & imaging results that were available during my care of the patient were reviewed by me and considered in my medical decision making (see chart for details).     Patient with abdominal pain. Has had some nausea vomiting diarrhea. Urine has been more slowly than normal. Found to have UTI. Mild abdominal tenderness. Labs reassuring except for UTI. Tolerated orals will discharge home. Culture sent.  Final Clinical Impressions(s) / ED Diagnoses   Final diagnoses:  Lower urinary tract infectious disease  Diarrhea, unspecified type    New Prescriptions New Prescriptions   DICYCLOMINE (BENTYL) 20 MG TABLET    Take 1 tablet (20 mg total) by mouth 3 (three) times daily as needed for spasms.     Davonna Belling, MD 10/19/16 1525

## 2016-10-19 NOTE — ED Notes (Signed)
Pt is alert and oriented x 4 and is verbally responsive. Pt states that she has been having N/V/D x 3 days. Pt denies blood in stool or emesis. Pt denies pain or burning with urination, but does report urine has had a strong odor. Pt reports abdominal pain to Bilateral upper quads, and states area is tender and sore 5/10 Pain.

## 2016-10-19 NOTE — ED Triage Notes (Signed)
Patient c/o intermittent mid abdominal cramping that radiates to her back, nausea and diarrhea since yesterday.

## 2016-10-19 NOTE — ED Notes (Signed)
Pt tolerated oral intake well.  

## 2016-10-21 LAB — URINE CULTURE: Culture: >=100000 — AB

## 2016-10-22 ENCOUNTER — Telehealth: Payer: Self-pay

## 2016-10-22 NOTE — Telephone Encounter (Signed)
Post ED Visit - Positive Culture Follow-up  Culture report reviewed by antimicrobial stewardship pharmacist:  []  Elenor Quinones, Pharm.D. []  Heide Guile, Pharm.D., BCPS AQ-ID []  Parks Neptune, Pharm.D., BCPS [x]  Alycia Rossetti, Pharm.D., BCPS []  [place], [place].D., BCPS, AAHIVP []  Legrand Como, Pharm.D., BCPS, AAHIVP []  Salome Arnt, PharmD, BCPS []  Dimitri Ped, PharmD, BCPS []  Vincenza Hews, PharmD, BCPS  Positive urine culture Treated with Cephalexin, organism sensitive to the same and no further patient follow-up is required at this time.  Genia Del 10/22/2016, 9:22 AM

## 2017-03-05 ENCOUNTER — Emergency Department: Payer: PRIVATE HEALTH INSURANCE

## 2017-03-05 ENCOUNTER — Other Ambulatory Visit: Payer: Self-pay

## 2017-03-05 ENCOUNTER — Observation Stay
Admission: EM | Admit: 2017-03-05 | Discharge: 2017-03-06 | Disposition: A | Discharge status: Home / Self Care | Payer: PRIVATE HEALTH INSURANCE | Attending: Obstetrics and Gynecology | Admitting: Obstetrics and Gynecology

## 2017-03-05 DIAGNOSIS — N938 Other specified abnormal uterine and vaginal bleeding: Secondary | ICD-10-CM

## 2017-03-05 DIAGNOSIS — Z79899 Other long term (current) drug therapy: Secondary | ICD-10-CM | POA: Diagnosis not present

## 2017-03-05 DIAGNOSIS — N83201 Unspecified ovarian cyst, right side: Secondary | ICD-10-CM | POA: Insufficient documentation

## 2017-03-05 DIAGNOSIS — N939 Abnormal uterine and vaginal bleeding, unspecified: Secondary | ICD-10-CM

## 2017-03-05 DIAGNOSIS — I10 Essential (primary) hypertension: Secondary | ICD-10-CM

## 2017-03-05 DIAGNOSIS — D259 Leiomyoma of uterus, unspecified: Secondary | ICD-10-CM | POA: Diagnosis not present

## 2017-03-05 DIAGNOSIS — K900 Celiac disease: Secondary | ICD-10-CM | POA: Diagnosis not present

## 2017-03-05 DIAGNOSIS — Z888 Allergy status to other drugs, medicaments and biological substances status: Secondary | ICD-10-CM | POA: Insufficient documentation

## 2017-03-05 DIAGNOSIS — Z91018 Allergy to other foods: Secondary | ICD-10-CM | POA: Insufficient documentation

## 2017-03-05 DIAGNOSIS — D62 Acute posthemorrhagic anemia: Secondary | ICD-10-CM

## 2017-03-05 DIAGNOSIS — N83202 Unspecified ovarian cyst, left side: Secondary | ICD-10-CM | POA: Diagnosis not present

## 2017-03-05 DIAGNOSIS — D500 Iron deficiency anemia secondary to blood loss (chronic): Secondary | ICD-10-CM

## 2017-03-05 DIAGNOSIS — K219 Gastro-esophageal reflux disease without esophagitis: Secondary | ICD-10-CM

## 2017-03-05 LAB — COMPREHENSIVE METABOLIC PANEL
ALT: 14 U/L (ref 14–54)
AST: 19 U/L (ref 15–41)
Albumin: 3.6 g/dL (ref 3.5–5.0)
Alkaline Phosphatase: 50 U/L (ref 38–126)
Anion gap: 7 (ref 5–15)
BUN: 14 mg/dL (ref 6–20)
CO2: 23 mmol/L (ref 22–32)
Calcium: 8.8 mg/dL — ABNORMAL LOW (ref 8.9–10.3)
Chloride: 106 mmol/L (ref 101–111)
Creatinine, Ser: 0.62 mg/dL (ref 0.44–1.00)
GFR calc Af Amer: >60 mL/min (ref >60)
GFR calc non Af Amer: >60 mL/min (ref >60)
Glucose, Bld: 126 mg/dL — ABNORMAL HIGH (ref 65–99)
Potassium: 3.5 mmol/L (ref 3.5–5.1)
Sodium: 136 mmol/L (ref 135–145)
Total Bilirubin: 0.4 mg/dL (ref 0.3–1.2)
Total Protein: 6.7 g/dL (ref 6.5–8.1)

## 2017-03-05 LAB — CBC
HCT: 21.2 % — ABNORMAL LOW (ref 35.0–47.0)
Hemoglobin: 6.5 g/dL — ABNORMAL LOW (ref 12.0–16.0)
MCH: 22.7 pg — ABNORMAL LOW (ref 26.0–34.0)
MCHC: 30.4 g/dL — ABNORMAL LOW (ref 32.0–36.0)
MCV: 74.8 fL — ABNORMAL LOW (ref 80.0–100.0)
Platelets: 278 10*3/uL (ref 150–440)
RBC: 2.84 MIL/uL — ABNORMAL LOW (ref 3.80–5.20)
RDW: 16.5 % — ABNORMAL HIGH (ref 11.5–14.5)
WBC: 3.4 10*3/uL — ABNORMAL LOW (ref 3.6–11.0)

## 2017-03-05 LAB — POCT PREGNANCY, URINE: Preg Test, Ur: NEGATIVE

## 2017-03-05 LAB — PREPARE RBC (CROSSMATCH)

## 2017-03-05 LAB — ABO/RH: ABO/RH(D): O NEG

## 2017-03-05 MED ORDER — KETOROLAC TROMETHAMINE 30 MG/ML IJ SOLN
15.0000 mg | Freq: Once | INTRAMUSCULAR | Status: AC
Start: 2017-03-05 — End: 2017-03-05
  Administered 2017-03-05: 15 mg via INTRAVENOUS
  Filled 2017-03-05: qty 1

## 2017-03-05 MED ORDER — DEXTROSE IN LACTATED RINGERS 5 % IV SOLN
INTRAVENOUS | Status: DC
  Administered 2017-03-06: 01:00:00 via INTRAVENOUS

## 2017-03-05 MED ORDER — PRENATAL MULTIVITAMIN CH: 1.0000 | ORAL_TABLET | Freq: Every day | ORAL | Status: DC

## 2017-03-05 MED ORDER — INFLUENZA VAC SPLIT QUAD 0.5 ML IM SUSY
0.5000 mL | PREFILLED_SYRINGE | INTRAMUSCULAR | Status: DC
Start: 2017-03-06 — End: 2017-03-06
  Filled 2017-03-05: qty 0.5

## 2017-03-05 MED ORDER — ACETAMINOPHEN 325 MG PO TABS
650.0000 mg | ORAL_TABLET | ORAL | Status: DC | PRN
  Administered 2017-03-06: 650 mg via ORAL
  Filled 2017-03-05: qty 2

## 2017-03-05 MED ORDER — ACETAMINOPHEN 325 MG PO TABS
650.0000 mg | ORAL_TABLET | Freq: Once | ORAL | Status: AC
  Administered 2017-03-05: 650 mg via ORAL

## 2017-03-05 MED ORDER — ACETAMINOPHEN 325 MG PO TABS
ORAL_TABLET | ORAL | Status: AC
  Filled 2017-03-05: qty 2

## 2017-03-05 MED ORDER — NORETHINDRONE ACETATE 5 MG PO TABS
10.0000 mg | ORAL_TABLET | Freq: Every day | ORAL | Status: DC
  Administered 2017-03-05 – 2017-03-06 (×2): 10 mg via ORAL
  Filled 2017-03-05 (×2): qty 2

## 2017-03-05 MED ORDER — SODIUM CHLORIDE 0.9 % IV SOLN
10.0000 mL/h | Freq: Once | INTRAVENOUS | Status: AC
  Administered 2017-03-05: 10 mL/h via INTRAVENOUS

## 2017-03-05 NOTE — ED Provider Notes (Signed)
[facility] Emergency Department Provider Note    First MD Initiated Contact with Patient 03/05/17 1146     (approximate)  I have reviewed the triage vital signs and the nursing notes.   HISTORY  Chief Complaint Vaginal Bleeding and Weakness    HPI [name] is a 51 y.o. female presents with chief complaint of generalized weakness and lightheadedness particularly when she is standing to pass out.  Patient states that for the past 5-6 months she has been having heavy vaginal bleeding intermittent cramping sensation but no pain today.  Patient has not been seen for her vaginal bleeding.  Denies any history of abnormal Pap smears.  She does not smoke.  No known history of malignancy.  Denies any discharge.  Her presentation to the ER today was due to weakness.  She is not on any blood thinners.  Past Medical History:  Diagnosis Date  . Celiac disease   . GERD (gastroesophageal reflux disease)   . Hypertension   . Migraine with visual aura   . UTI (lower urinary tract infection)    Family History  Problem Relation Age of Onset  . Diabetes Son 12       type 1   Past Surgical History:  Procedure Laterality Date  . BLADDER SUSPENSION    . EXPLORATORY LAPAROTOMY    . TUBAL LIGATION     Patient Active Problem List   Diagnosis Date Noted  . BV (bacterial vaginosis) 11/27/2012  . UTI (lower urinary tract infection) 11/27/2012  . Migraine with visual aura 09/19/2011  . Depression 09/19/2011  . Constipation 06/25/2011      Prior to Admission medications   Medication Sig Start Date End Date Taking? Authorizing Provider  Acetaminophen (MIDOL PO) Take 2 tablets by mouth every 4 (four) hours as needed (Pain).   Yes [provider]  ibuprofen (ADVIL,MOTRIN) 200 MG tablet Take 200 mg by mouth every 6 (six) hours as needed.   Yes [provider]  dicyclomine (BENTYL) 20 MG tablet Take 1 tablet (20 mg total) by mouth 3 (three) times daily  as needed for spasms. Patient not taking: Reported on 03/05/2017 10/19/16   Davonna Belling, MD  phenazopyridine (PYRIDIUM) 200 MG tablet Take 1 tablet (200 mg total) by mouth 3 (three) times daily as needed for pain. Patient not taking: Reported on 10/19/2016 07/13/16   Barnet Glasgow, NP  promethazine (PHENERGAN) 25 MG tablet Take 1 tablet (25 mg total) by mouth every 6 (six) hours as needed for nausea or vomiting. Patient not taking: Reported on 10/19/2016 07/13/16   Barnet Glasgow, NP    Allergies Gluten meal and Ondansetron    Social History Social History   Tobacco Use  . Smoking status: Never Smoker  . Smokeless tobacco: Never Used  Substance Use Topics  . Alcohol use: No    Alcohol/week: 0.0 oz  . Drug use: No    Review of Systems Patient denies headaches, rhinorrhea, blurry vision, numbness, shortness of breath, chest pain, edema, cough, abdominal pain, nausea, vomiting, diarrhea, dysuria, fevers, rashes or hallucinations unless otherwise stated above in HPI. ____________________________________________   PHYSICAL EXAM:  VITAL SIGNS: Vitals:   03/05/17 1330 03/05/17 1400  BP: 119/79 121/72  Pulse: 64 75  Resp: 13 17  Temp:    SpO2: 97% 97%    Constitutional: Alert and oriented. Well appearing and in no acute distress. Eyes: Conjunctivae are normal.  Head: Atraumatic. Nose: No congestion/rhinnorhea. Mouth/Throat: Mucous membranes are moist.  Neck: No stridor. Painless ROM.  Cardiovascular: Normal rate, regular rhythm. Grossly normal heart sounds.  Good peripheral circulation. Respiratory: Normal respiratory effort.  No retractions. Lungs CTAB. Gastrointestinal: Soft and nontender. No distention. No abdominal bruits. No CVA tenderness. Genitourinary: Large volume blood in the vaginal vault.  There is oozing bleeding from the cervix.  Cervix appears almost nodular but I do not see any obvious mass or bleeding vessels. Musculoskeletal: No lower extremity  tenderness nor edema.  No joint effusions. Neurologic:  Normal speech and language. No gross focal neurologic deficits are appreciated. No facial droop Skin:  Skin is warm, dry and intact. No rash noted. Psychiatric: Mood and affect are normal. Speech and behavior are normal.  ____________________________________________   LABS (all labs ordered are listed, but only abnormal results are displayed)  Results for orders placed or performed during the hospital encounter of 03/05/17 (from the past 24 hour(s))  CBC     Status: Abnormal   Collection Time: 03/05/17 10:58 AM  Result Value Ref Range   WBC 3.4 (L) 3.6 - 11.0 K/uL   RBC 2.84 (L) 3.80 - 5.20 MIL/uL   Hemoglobin 6.5 (L) 12.0 - 16.0 g/dL   HCT 21.2 (L) 35.0 - 47.0 %   MCV 74.8 (L) 80.0 - 100.0 fL   MCH 22.7 (L) 26.0 - 34.0 pg   MCHC 30.4 (L) 32.0 - 36.0 g/dL   RDW 16.5 (H) 11.5 - 14.5 %   Platelets 278 150 - 440 K/uL  Comprehensive metabolic panel     Status: Abnormal   Collection Time: 03/05/17 10:58 AM  Result Value Ref Range   Sodium 136 135 - 145 mmol/L   Potassium 3.5 3.5 - 5.1 mmol/L   Chloride 106 101 - 111 mmol/L   CO2 23 22 - 32 mmol/L   Glucose, Bld 126 (H) 65 - 99 mg/dL   BUN 14 6 - 20 mg/dL   Creatinine, Ser 0.62 0.44 - 1.00 mg/dL   Calcium 8.8 (L) 8.9 - 10.3 mg/dL   Total Protein 6.7 6.5 - 8.1 g/dL   Albumin 3.6 3.5 - 5.0 g/dL   AST 19 15 - 41 U/L   ALT 14 14 - 54 U/L   Alkaline Phosphatase 50 38 - 126 U/L   Total Bilirubin 0.4 0.3 - 1.2 mg/dL   GFR calc non Af Amer >60 >60 mL/min   GFR calc Af Amer >60 >60 mL/min   Anion gap 7 5 - 15  Type and screen Tracy     Status: None   Collection Time: 03/05/17 10:58 AM  Result Value Ref Range   ABO/RH(D) O NEG    Antibody Screen NEG    Sample Expiration 03/08/2017   Pregnancy, urine POC     Status: None   Collection Time: 03/05/17  2:01 PM  Result Value Ref Range   Preg Test, Ur NEGATIVE NEGATIVE  Prepare RBC     Status: None  (Preliminary result)   Collection Time: 03/05/17  4:30 PM  Result Value Ref Range   Order Confirmation PENDING    ____________________________________________  EKG My review and personal interpretation at Time: 12:43   Indication: chest pain  Rate: 65  Rhythm: sinus Axis: normal Other: normal intervals, no stemi,  ____________________________________________  RADIOLOGY  I personally reviewed all radiographic images ordered to evaluate for the above acute complaints and reviewed radiology reports and findings.  These findings were personally discussed with the patient.  Please see medical record for  radiology report.  ____________________________________________   PROCEDURES  Procedure(s) performed:  Procedures    Critical Care performed: no ____________________________________________   INITIAL IMPRESSION / ASSESSMENT AND PLAN / ED COURSE  Pertinent labs & imaging results that were available during my care of the patient were reviewed by me and considered in my medical decision making (see chart for details).  DDX: Mass, fibroid, ectopic, miscarriage, bleeding disorder, acute blood loss anemia  [name] is a 51 y.o. who presents to the ED with evidence of acute blood loss anemia with symptomatic anemia down to 6.5.  Exam as above.  Ultrasound ordered to evaluate for structural lesion does show evidence of endometrial enlargement.  Based on persistent bleeding do feel patient will require transfusion.  Spoke with Dr. Amalia Hailey of OB/GYN who has requested dose aygestin and kindly agrees to admit patient to his service for type directed transfusion.  Have discussed with the patient and available family all diagnostics and treatments performed thus far and all questions were answered to the best of my ability. The patient demonstrates understanding and agreement with plan.       ____________________________________________   FINAL CLINICAL IMPRESSION(S) / ED  DIAGNOSES  Final diagnoses:  Vaginal bleeding  Acute blood loss anemia      NEW MEDICATIONS STARTED DURING THIS VISIT:  This SmartLink is deprecated. Use AVSMEDLIST instead to display the medication list for a patient.   Note:  This document was prepared using Dragon voice recognition software and may include unintentional dictation errors.    Merlyn Lot, MD 03/05/17 [phone]

## 2017-03-05 NOTE — ED Triage Notes (Signed)
Pt c/o having vaginal bleeding for the past 5 months, states today is having increased weakness. Denies pain.

## 2017-03-05 NOTE — H&P (Signed)
ADMIT NOTE  HPI:      [name] is a 51 y.o.   Subjective: She presents today with complaint of persistent vaginal bleeding.  She has had irregular vaginal bleeding over the last 5 months.  She often describes the bleeding as heavy it is occasionally accompanied by cramping.  She complains today of feeling rundown and occasionally "lightheaded". She has a tubal ligation for birth control. Her most recent Pap smear is not readily available.           HISTORY Allergies  Allergen Reactions  . Gluten Meal Diarrhea and Nausea Only       . Ondansetron Other (See Comments)    Helps her nausea, but makes HA worse    OB History  OB History  No data available    Past Medical History  Past Medical History:  Diagnosis Date  . Celiac disease   . GERD (gastroesophageal reflux disease)   . Hypertension   . Migraine with visual aura   . UTI (lower urinary tract infection)     Past Surgical History  Past Surgical History:  Procedure Laterality Date  . BLADDER SUSPENSION    . EXPLORATORY LAPAROTOMY    . TUBAL LIGATION        Past Social History:  Social History   Socioeconomic History  . Marital status: Married    Spouse name: Jenny Reichmann  . Number of children: 3  . Years of education: 80  . Highest education level: None  Social Needs  . Financial resource strain: None  . Food insecurity - worry: None  . Food insecurity - inability: None  . Transportation needs - medical: None  . Transportation needs - non-medical: None  Occupational History  . Occupation: Personnel officer  Tobacco Use  . Smoking status: Never Smoker  . Smokeless tobacco: Never Used  Substance and Sexual Activity  . Alcohol use: No    Alcohol/week: 0.0 oz  . Drug use: No  . Sexual activity: Yes    Partners: Female    Birth control/protection: Surgical    Comment: INTERCOURSE AGE 51, SEXUAL PARTNERS LEES THAN 6  Other Topics Concern  . None  Social History Narrative   Lives with her husband and their three children, and her daughter's boyfriend.  Her older son's daughter lives there part-time as well.    Family History  Family History  Problem Relation Age of Onset  . Diabetes Son 12       type 1     ROS: Constitutional: Denied constitutional symptoms, night sweats, recent illness, fatigue, fever, insomnia and weight loss.  Eyes: Denied eye symptoms, eye pain, photophobia, vision change and visual disturbance.  Ears/Nose/Throat/Neck: Denied ear, nose, throat or neck symptoms, hearing loss, nasal discharge, sinus congestion and sore throat.  Cardiovascular: Denied cardiovascular symptoms, arrhythmia, chest pain/pressure, edema, exercise intolerance, orthopnea and palpitations.  Respiratory: Denied pulmonary symptoms, asthma, pleuritic pain, productive sputum, cough, dyspnea and wheezing.  Gastrointestinal: Denied, gastro-esophageal reflux, melena, nausea and vomiting.  Genitourinary: See HPI for additional information.  Musculoskeletal: Denied musculoskeletal symptoms, stiffness, swelling, muscle weakness and myalgia.  Dermatologic: Denied dermatology symptoms, rash and scar.  Neurologic: Denied neurology symptoms, dizziness, headache, neck pain and syncope.  Psychiatric: Denied psychiatric symptoms, anxiety and depression.  Endocrine: Denied endocrine symptoms including hot flashes and night sweats.   Medications      Medication List    ASK your doctor about these medications  dicyclomine 20 MG tablet Commonly known as:  BENTYL Take 1 tablet (20 mg total) by mouth 3 (three) times daily as needed for spasms.   ibuprofen 200 MG tablet Commonly known as:  ADVIL,MOTRIN   MIDOL PO   phenazopyridine 200 MG tablet Commonly known as:  PYRIDIUM Take 1 tablet (200 mg total) by mouth 3 (three) times daily as needed for pain.   promethazine 25 MG tablet Commonly known as:  PHENERGAN Take 1 tablet (25 mg total) by mouth every 6 (six) hours as  needed for nausea or vomiting.        Objective: Vitals:   03/05/17 1330 03/05/17 1400  BP: 119/79 121/72  Pulse: 64 75  Resp: 13 17  Temp:    SpO2: 97% 97%      Abnormal Labs Reviewed  CBC - Abnormal; Notable for the following components:      Result Value   WBC 3.4 (*)    RBC 2.84 (*)    Hemoglobin 6.5 (*)    HCT 21.2 (*)    MCV 74.8 (*)    MCH 22.7 (*)    MCHC 30.4 (*)    RDW 16.5 (*)    All other components within normal limits  COMPREHENSIVE METABOLIC PANEL - Abnormal; Notable for the following components:   Glucose, Bld 126 (*)    Calcium 8.8 (*)    All other components within normal limits   FINDINGS: Uterus  Measurements: 11.3 x 4.6 x 6.3 cm. 1.3 cm fibroid is noted in the left side of the fundus.  Endometrium  Thickness: 23 mm which is abnormally thickened. No focal abnormality visualized.  Right ovary  Measurements: 4.1 x 2.4 x 2.3 cm. 3.2 cm simple cyst is noted.  Left ovary  Measurements: 3.7 x 2.7 x 1.6 cm. 2.6 cm simple cyst is noted.  Other findings  No abnormal free fluid.  IMPRESSION: Small uterine fibroid.  Bilateral simple ovarian cysts.  Endometrium is abnormally thickened at 23 mm. If bleeding remains unresponsive to hormonal or medical therapy, focal lesion work-up with sonohysterogram should be considered. Endometrial biopsy should also be considered in pre-menopausal patients at high risk for endometrial carcinoma. (Ref: Radiological Reasoning: Algorithmic Workup of Abnormal Vaginal Bleeding with Endovaginal Sonography and Sonohysterography. AJR 2008; 324:M01-02)  Exam from ED notes: Constitutional: Alert and oriented. Well appearing and in no acute distress. Eyes: Conjunctivae are normal.  Head: Atraumatic. Nose: No congestion/rhinnorhea. Mouth/Throat: Mucous membranes are moist.   Neck: No stridor. Painless ROM.  Cardiovascular: Normal rate, regular rhythm. Grossly normal heart sounds.  Good peripheral  circulation. Respiratory: Normal respiratory effort.  No retractions. Lungs CTAB. Gastrointestinal: Soft and nontender. No distention. No abdominal bruits. No CVA tenderness. Genitourinary: Large volume blood in the vaginal vault.  There is oozing bleeding from the cervix.  Cervix appears almost nodular but I do not see any obvious mass or bleeding vessels. Musculoskeletal: No lower extremity tenderness nor edema.  No joint effusions. Neurologic:  Normal speech and language. No gross focal neurologic deficits are appreciated. No facial droop Skin:  Skin is warm, dry and intact. No rash noted. Psychiatric: Mood and affect are normal. Speech and behavior are normal.  ASSESSMENT:  1.  Acute blood loss anemia:  Likely secondary to uterine fibroids. 2.  Thickened endometrium:  Possibly secondary to endometrial hyperplasia or malignancy.  May also be a result of perimenopausal anovulation.  PLAN: 1.  Stop bleeding using progestin (Aygestin) 2.  Transfuse 2 units packed red cells 3.  Continue workup as outpatient.  Likely endometrial biopsy and Pap smear.  Jeannie Fend ,MD 03/05/2017,4:41 PM

## 2017-03-05 NOTE — ED Notes (Signed)
Per Dr Quentin Cornwall pt will receive blood products once she arrives on floor

## 2017-03-05 NOTE — Progress Notes (Signed)
RN called MD to clarify orders. RN inquired about post transfusions H&H. MD declined a post H&H at this time.    Hilbert Bible, RN

## 2017-03-05 NOTE — ED Notes (Signed)
Lab called and stated they needed a 2nd small purple top for blood type verification - they stated they would call me back once test once ran and then I could release the blood to be given

## 2017-03-05 NOTE — ED Notes (Signed)
Pt c/o weakness - she has been having vaginal bleeding for 5-6 months (has not seen MD for this) - decreased appetite

## 2017-03-05 NOTE — Progress Notes (Signed)
Blood consent signed at this time.   Hilbert Bible, RN

## 2017-03-05 NOTE — Progress Notes (Signed)
First Unit of [facility] started as per order. Pt. V/O of purpose and potential side effects of Blood Transfusion . Blood Transfusion signed and witnessed is on Pt. Shadow Chart.Premedicated with Tylenol X1 as per order.

## 2017-03-05 NOTE — ED Notes (Signed)
Patient transported to Ultrasound 

## 2017-03-05 NOTE — ED Notes (Signed)
Pt unhooked from monitor and allowed to go to the bathroom. No urine sample ordered but pt given cup for sample.

## 2017-03-06 DIAGNOSIS — I10 Essential (primary) hypertension: Secondary | ICD-10-CM | POA: Diagnosis not present

## 2017-03-06 DIAGNOSIS — N938 Other specified abnormal uterine and vaginal bleeding: Secondary | ICD-10-CM | POA: Diagnosis not present

## 2017-03-06 DIAGNOSIS — K219 Gastro-esophageal reflux disease without esophagitis: Secondary | ICD-10-CM | POA: Diagnosis not present

## 2017-03-06 DIAGNOSIS — D500 Iron deficiency anemia secondary to blood loss (chronic): Secondary | ICD-10-CM | POA: Diagnosis not present

## 2017-03-06 LAB — TYPE AND SCREEN
ABO/RH(D): O NEG
Antibody Screen: NEGATIVE
Unit division: 0
Unit division: 0

## 2017-03-06 LAB — BPAM RBC
Blood Product Expiration Date: 201811272359
Blood Product Expiration Date: 201812012359
ISSUE DATE / TIME: 201811241936
ISSUE DATE / TIME: 201811242208
Unit Type and Rh: 9500
Unit Type and Rh: 9500

## 2017-03-06 MED ORDER — NORETHINDRONE ACETATE 5 MG PO TABS: 5.0000 mg | ORAL_TABLET | Freq: Three times a day (TID) | ORAL | 1 refills | Status: DC

## 2017-03-06 NOTE — Plan of Care (Signed)
VS stable; tolerating regular diet; states "I feel better"; MD rounded on pt at 0600 and has discharged pt

## 2017-03-06 NOTE — Discharge Summary (Signed)
    L&D GYN Triage Note  SUBJECTIVE Jinger Middlesworth is a 51 y.o. No obstetric history on file. female at Unknown, EDD Estimated Date of Delivery: None noted. who presented to ED with c/o persistent vaginal bleeding and lightheadedness.  Obstetric History    No data available      Medications Prior to Admission  Medication Sig Dispense Refill Last Dose  . Acetaminophen (MIDOL PO) Take 2 tablets by mouth every 4 (four) hours as needed (Pain).   PRN at PRN  . ibuprofen (ADVIL,MOTRIN) 200 MG tablet Take 200 mg by mouth every 6 (six) hours as needed.   PRN at PRN  . dicyclomine (BENTYL) 20 MG tablet Take 1 tablet (20 mg total) by mouth 3 (three) times daily as needed for spasms. (Patient not taking: Reported on 03/05/2017) 10 tablet 0 Completed Course at Unknown time  . phenazopyridine (PYRIDIUM) 200 MG tablet Take 1 tablet (200 mg total) by mouth 3 (three) times daily as needed for pain. (Patient not taking: Reported on 10/19/2016) 10 tablet 0 Completed Course at Unknown time  . promethazine (PHENERGAN) 25 MG tablet Take 1 tablet (25 mg total) by mouth every 6 (six) hours as needed for nausea or vomiting. (Patient not taking: Reported on 10/19/2016) 30 tablet 0 Completed Course at Unknown time     OBJECTIVE  Nursing Evaluation:   BP 118/62 (BP Location: Left Arm)   Pulse 72   Temp 98 F (36.7 C) (Oral)   Resp 18   Ht 5' (1.524 m)   Wt 155 lb (70.3 kg)   SpO2 97%   BMI 30.27 kg/m    Findings:  Pt feeling much better at time of discharge.  Pt was seen by me before discharge.     ASSESSMENT Impression:  1. DUB - likely secondary to fibroids or endometrial hyperplasia. 2.  Blood loss anemia. - pt improved after transfusion.  PLAN 1.  Continue Aygestin for bleeding control 2.  Endometrial bx as outpatient.

## 2017-03-06 NOTE — Discharge Instructions (Signed)
° °  Abnormal Uterine Bleeding Abnormal uterine bleeding means bleeding more than usual from your uterus. It can include:  Bleeding between periods.  Bleeding after sex.  Bleeding that is heavier than normal.  Periods that last longer than usual.  Bleeding after you have stopped having your period (menopause).  There are many problems that may cause this. You should see a doctor for any kind of bleeding that is not normal. Treatment depends on the cause of the bleeding. Follow these instructions at home:  Watch your condition for any changes.  Do not use tampons, douche, or have sex, if your doctor tells you not to.  Change your pads often.  Get regular well-woman exams. Make sure they include a pelvic exam and cervical cancer screening.  Keep all follow-up visits as told by your doctor. This is important. Contact a doctor if:  The bleeding lasts more than one week.  You feel dizzy at times.  You feel like you are going to throw up (nauseous).  You throw up. Get help right away if:  You pass out.  You have to change pads every hour.  You have belly (abdominal) pain.  You have a fever.  You get sweaty.  You get weak.  You passing large blood clots from your vagina. Summary  Abnormal uterine bleeding means bleeding more than usual from your uterus.  There are many problems that may cause this. You should see a doctor for any kind of bleeding that is not normal.  Treatment depends on the cause of the bleeding. This information is not intended to replace advice given to you by your health care provider. Make sure you discuss any questions you have with your health care provider. Document Released: 01/24/2009 Document Revised: 03/23/2016 Document Reviewed: 03/23/2016 Elsevier Interactive Patient Education  2017 [place] MD for any dizziness, persistent bleeding or passing clots from vagina, fever, any concerns. Please call Dr. Amalia Hailey office on Monday  to schedule Out patient appointment.

## 2017-03-06 NOTE — Progress Notes (Signed)
Discharge instr reviewed with pt.  Verb u/o of calling for appt at [facility] tomorrow.  Phone number given.  Rx given for home use.

## 2017-03-06 NOTE — Progress Notes (Signed)
Discharge home.  To car via wc.

## 2017-03-09 ENCOUNTER — Encounter: Payer: Self-pay | Admitting: Obstetrics and Gynecology

## 2017-03-09 ENCOUNTER — Ambulatory Visit (INDEPENDENT_AMBULATORY_CARE_PROVIDER_SITE_OTHER): Payer: PRIVATE HEALTH INSURANCE | Admitting: Obstetrics and Gynecology

## 2017-03-09 VITALS — BP 133/76 | HR 69 | Ht 60.0 in | Wt 150.1 lb

## 2017-03-09 DIAGNOSIS — N938 Other specified abnormal uterine and vaginal bleeding: Secondary | ICD-10-CM | POA: Diagnosis not present

## 2017-03-09 NOTE — Progress Notes (Signed)
HPI:      [name] is a 51 y.o. [phone] who LMP was No LMP recorded.  Subjective:   She presents today after receiving 2 units of packed red cells at the hospital for menorrhagia.  Patient has had approximately 5 months of dysfunctional bleeding. An ultrasound performed in the hospital revealed a thickened endometrium and a uterine fibroid. She was begun on Aygestin and this has stopped her bleeding. She presents today for follow-up. Patient states that she has had normal Pap smears all of her life and a normal within the last 2 years.    Hx: The following portions of the patient's history were reviewed and updated as appropriate:             She  has a past medical history of Celiac disease, GERD (gastroesophageal reflux disease), Hypertension, Migraine with visual aura, and UTI (lower urinary tract infection). She does not have any pertinent problems on file. She  has a past surgical history that includes Tubal ligation; Exploratory laparotomy; and Bladder suspension. Her family history includes Diabetes (age of onset: 25) in her son. She  reports that  has never smoked. she has never used smokeless tobacco. She reports that she does not drink alcohol or use drugs. She is allergic to gluten meal and ondansetron.       Review of Systems:  Review of Systems  Constitutional: Denied constitutional symptoms, night sweats, recent illness, fatigue, fever, insomnia and weight loss.  Eyes: Denied eye symptoms, eye pain, photophobia, vision change and visual disturbance.  Ears/Nose/Throat/Neck: Denied ear, nose, throat or neck symptoms, hearing loss, nasal discharge, sinus congestion and sore throat.  Cardiovascular: Denied cardiovascular symptoms, arrhythmia, chest pain/pressure, edema, exercise intolerance, orthopnea and palpitations.  Respiratory: Denied pulmonary symptoms, asthma, pleuritic pain, productive sputum, cough, dyspnea and wheezing.  Gastrointestinal: Denied,  gastro-esophageal reflux, melena, nausea and vomiting.  Genitourinary: Denied genitourinary symptoms including symptomatic vaginal discharge, pelvic relaxation issues, and urinary complaints.  Musculoskeletal: Denied musculoskeletal symptoms, stiffness, swelling, muscle weakness and myalgia.  Dermatologic: Denied dermatology symptoms, rash and scar.  Neurologic: Denied neurology symptoms, dizziness, headache, neck pain and syncope.  Psychiatric: Denied psychiatric symptoms, anxiety and depression.  Endocrine: Denied endocrine symptoms including hot flashes and night sweats.   Meds:   Current Outpatient Medications on File Prior to Visit  Medication Sig Dispense Refill  . ibuprofen (ADVIL,MOTRIN) 200 MG tablet Take 200 mg by mouth every 6 (six) hours as needed.    . norethindrone (AYGESTIN) 5 MG tablet Take 1 tablet (5 mg total) by mouth 3 (three) times daily. 50 tablet 1  . [DISCONTINUED] medroxyPROGESTERone (PROVERA) 10 MG tablet Take one tab daily D1-14 of each mos x 3 mos. (Patient not taking: Reported on 11/12/2014) 42 tablet 0   No current facility-administered medications on file prior to visit.     Objective:     Vitals:   03/09/17 0824  BP: 133/76  Pulse: 69              Physical examination   Pelvic:   Vulva: Normal appearance.  No lesions.  Vagina: No lesions or abnormalities noted.  Support: Normal pelvic support.  Urethra No masses tenderness or scarring.  Meatus Normal size without lesions or prolapse.  Cervix: Normal appearance.  No lesions.  Anus: Normal exam.  No lesions.  Perineum: Normal exam.  No lesions.        Bimanual   Uterus:  10 weeks size.  Non-tender.  Mobile.  AV.  Adnexae: No masses.  Non-tender to palpation.  Cul-de-sac: Negative for abnormality.   Endometrial Biopsy After discussion with the patient regarding her abnormal uterine bleeding I recommended that she proceed with an endometrial biopsy for further diagnosis. The risks, benefits,  alternatives, and indications for an endometrial biopsy were discussed with the patient in detail. She understood the risks including infection, bleeding, cervical laceration and uterine perforation.  Verbal consent was obtained.   PROCEDURE NOTE:  Vacurette endometrial biopsy was performed using aseptic technique with iodine preparation.  The uterus was sounded to a length of 8 cm.  Adequate sampling was obtained with minimal blood loss.  The patient tolerated the procedure well.  Disposition will be pending pathology   Assessment:    [id number] Patient Active Problem List   Diagnosis Date Noted  . Blood loss anemia 03/05/2017  . BV (bacterial vaginosis) 11/27/2012  . UTI (lower urinary tract infection) 11/27/2012  . Migraine with visual aura 09/19/2011  . Depression 09/19/2011  . Constipation 06/25/2011     1. DUB (dysfunctional uterine bleeding)     This may be secondary to uterine fibroids for possible endometrial hyperplasia.   Plan:            1.  Await biopsy results discussed management at that time. Orders No orders of the defined types were placed in this encounter.   No orders of the defined types were placed in this encounter.     F/U  Return in about 1 week (around 03/16/2017).  Finis Bud, M.D. 03/09/2017 9:06 AM

## 2017-03-09 NOTE — Addendum Note (Signed)
Addended by: Raliegh Ip on: 03/09/2017 09:28 AM   Modules accepted: Orders

## 2017-03-11 LAB — PATHOLOGY

## 2017-03-16 ENCOUNTER — Encounter: Payer: Self-pay | Admitting: Obstetrics and Gynecology

## 2017-03-16 ENCOUNTER — Ambulatory Visit (INDEPENDENT_AMBULATORY_CARE_PROVIDER_SITE_OTHER): Payer: PRIVATE HEALTH INSURANCE | Admitting: Obstetrics and Gynecology

## 2017-03-16 VITALS — BP 147/93 | HR 73 | Ht 60.0 in | Wt 149.0 lb

## 2017-03-16 DIAGNOSIS — N840 Polyp of corpus uteri: Secondary | ICD-10-CM

## 2017-03-16 DIAGNOSIS — N938 Other specified abnormal uterine and vaginal bleeding: Secondary | ICD-10-CM

## 2017-03-16 DIAGNOSIS — D219 Benign neoplasm of connective and other soft tissue, unspecified: Secondary | ICD-10-CM

## 2017-03-16 NOTE — Progress Notes (Signed)
HPI:      [name] is a 51 y.o. [phone] who LMP was No LMP recorded.  Subjective:   She presents today to discuss her endometrial biopsy and make a plan going forward regarding her uterine fibroid and dysfunctional bleeding.  She is currently taking Aygestin is not bleeding.  She recently received a blood transfusion because of significant menorrhagia.    Hx: The following portions of the patient's history were reviewed and updated as appropriate:             She  has a past medical history of Celiac disease, GERD (gastroesophageal reflux disease), Hypertension, Migraine with visual aura, and UTI (lower urinary tract infection). She does not have any pertinent problems on file. She  has a past surgical history that includes Tubal ligation; Exploratory laparotomy; and Bladder suspension. Her family history includes Diabetes (age of onset: 49) in her son. She  reports that  has never smoked. she has never used smokeless tobacco. She reports that she does not drink alcohol or use drugs. She is allergic to gluten meal and ondansetron.       Review of Systems:  Review of Systems  Constitutional: Denied constitutional symptoms, night sweats, recent illness, fatigue, fever, insomnia and weight loss.  Eyes: Denied eye symptoms, eye pain, photophobia, vision change and visual disturbance.  Ears/Nose/Throat/Neck: Denied ear, nose, throat or neck symptoms, hearing loss, nasal discharge, sinus congestion and sore throat.  Cardiovascular: Denied cardiovascular symptoms, arrhythmia, chest pain/pressure, edema, exercise intolerance, orthopnea and palpitations.  Respiratory: Denied pulmonary symptoms, asthma, pleuritic pain, productive sputum, cough, dyspnea and wheezing.  Gastrointestinal: Denied, gastro-esophageal reflux, melena, nausea and vomiting.  Genitourinary: Denied genitourinary symptoms including symptomatic vaginal discharge, pelvic relaxation issues, and urinary complaints.   Musculoskeletal: Denied musculoskeletal symptoms, stiffness, swelling, muscle weakness and myalgia.  Dermatologic: Denied dermatology symptoms, rash and scar.  Neurologic: Denied neurology symptoms, dizziness, headache, neck pain and syncope.  Psychiatric: Denied psychiatric symptoms, anxiety and depression.  Endocrine: Denied endocrine symptoms including hot flashes and night sweats.   Meds:   Current Outpatient Medications on File Prior to Visit  Medication Sig Dispense Refill  . ibuprofen (ADVIL,MOTRIN) 200 MG tablet Take 200 mg by mouth every 6 (six) hours as needed.    . norethindrone (AYGESTIN) 5 MG tablet Take 1 tablet (5 mg total) by mouth 3 (three) times daily. 50 tablet 1  . [DISCONTINUED] medroxyPROGESTERone (PROVERA) 10 MG tablet Take one tab daily D1-14 of each mos x 3 mos. (Patient not taking: Reported on 11/12/2014) 42 tablet 0   No current facility-administered medications on file prior to visit.     Objective:     Vitals:   03/16/17 0821  BP: (!) 147/93  Pulse: 73              Endometrial biopsy results reviewed directly with the patient.  Assessment:    [id number] Patient Active Problem List   Diagnosis Date Noted  . Blood loss anemia 03/05/2017  . BV (bacterial vaginosis) 11/27/2012  . UTI (lower urinary tract infection) 11/27/2012  . Migraine with visual aura 09/19/2011  . Depression 09/19/2011  . Constipation 06/25/2011     1. DUB (dysfunctional uterine bleeding)   2. Fibroid   3. Endometrial polyp     No evidence of hyperplasia or malignancy.  Endometrial polyp noted.   Plan:            1.  I discussed multiple options with the patient including hormonal cycle control,  hysteroscopy D&C, IUD, definitive surgery.  The risks and benefits of each were specifically discussed.  She has elected to get an IUD in the hope that it will control her bleeding and that it will allow her to reach menopause whereupon her periods will stop and the fibroids will  shrink. Orders No orders of the defined types were placed in this encounter.   No orders of the defined types were placed in this encounter.     F/U  No Follow-up on file. I spent 17 minutes with this patient of which greater than 50% was spent discussing her endometrial biopsy, her fibroid uterus, and the multiple management schemes noted above.  Finis Bud, M.D. 03/16/2017 9:35 AM

## 2017-03-22 ENCOUNTER — Encounter: Payer: Self-pay | Admitting: Obstetrics and Gynecology

## 2017-03-22 ENCOUNTER — Ambulatory Visit (INDEPENDENT_AMBULATORY_CARE_PROVIDER_SITE_OTHER): Payer: PRIVATE HEALTH INSURANCE | Admitting: Obstetrics and Gynecology

## 2017-03-22 VITALS — BP 127/81 | HR 84 | Ht 60.0 in | Wt 152.2 lb

## 2017-03-22 DIAGNOSIS — Z30430 Encounter for insertion of intrauterine contraceptive device: Secondary | ICD-10-CM

## 2017-03-22 DIAGNOSIS — N938 Other specified abnormal uterine and vaginal bleeding: Secondary | ICD-10-CM

## 2017-03-22 NOTE — Progress Notes (Signed)
HPI:      [name] is a 51 y.o. [phone] who LMP was No LMP recorded.  Subjective:   She presents today for IUD insertion.  She would like to attempt IUD use for control of dysfunctional uterine bleeding.  See previous visits and dictations for further discussion on this matter.    Hx: The following portions of the patient's history were reviewed and updated as appropriate:             She  has a past medical history of Celiac disease, GERD (gastroesophageal reflux disease), Hypertension, Migraine with visual aura, and UTI (lower urinary tract infection). She does not have any pertinent problems on file. She  has a past surgical history that includes Tubal ligation; Exploratory laparotomy; and Bladder suspension. Her family history includes Diabetes (age of onset: 72) in her son. She  reports that  has never smoked. she has never used smokeless tobacco. She reports that she does not drink alcohol or use drugs. She is allergic to gluten meal and ondansetron.       Review of Systems:  Review of Systems  Constitutional: Denied constitutional symptoms, night sweats, recent illness, fatigue, fever, insomnia and weight loss.  Eyes: Denied eye symptoms, eye pain, photophobia, vision change and visual disturbance.  Ears/Nose/Throat/Neck: Denied ear, nose, throat or neck symptoms, hearing loss, nasal discharge, sinus congestion and sore throat.  Cardiovascular: Denied cardiovascular symptoms, arrhythmia, chest pain/pressure, edema, exercise intolerance, orthopnea and palpitations.  Respiratory: Denied pulmonary symptoms, asthma, pleuritic pain, productive sputum, cough, dyspnea and wheezing.  Gastrointestinal: Denied, gastro-esophageal reflux, melena, nausea and vomiting.  Genitourinary: Denied genitourinary symptoms including symptomatic vaginal discharge, pelvic relaxation issues, and urinary complaints.  Musculoskeletal: Denied musculoskeletal symptoms, stiffness, swelling, muscle weakness  and myalgia.  Dermatologic: Denied dermatology symptoms, rash and scar.  Neurologic: Denied neurology symptoms, dizziness, headache, neck pain and syncope.  Psychiatric: Denied psychiatric symptoms, anxiety and depression.  Endocrine: Denied endocrine symptoms including hot flashes and night sweats.   Meds:   Current Outpatient Medications on File Prior to Visit  Medication Sig Dispense Refill  . ibuprofen (ADVIL,MOTRIN) 200 MG tablet Take 200 mg by mouth every 6 (six) hours as needed.    . norethindrone (AYGESTIN) 5 MG tablet Take 1 tablet (5 mg total) by mouth 3 (three) times daily. 50 tablet 1  . [DISCONTINUED] medroxyPROGESTERone (PROVERA) 10 MG tablet Take one tab daily D1-14 of each mos x 3 mos. (Patient not taking: Reported on 11/12/2014) 42 tablet 0   No current facility-administered medications on file prior to visit.     Objective:     Vitals:   03/22/17 1522  BP: 127/81  Pulse: 84    Physical examination   Pelvic:   Vulva: Normal appearance.  No lesions.  Vagina: No lesions or abnormalities noted.  Support: Normal pelvic support.  Urethra No masses tenderness or scarring.  Meatus Normal size without lesions or prolapse.  Cervix: Normal appearance.  No lesions.  Anus: Normal exam.  No lesions.  Perineum: Normal exam.  No lesions.        Bimanual   Uterus: Normal size.  Non-tender.  Mobile.  AV.  Adnexae: No masses.  Non-tender to palpation.  Cul-de-sac: Negative for abnormality.   IUD Procedure Pt has read the booklet and signed the appropriate forms regarding the Mirena IUD.  All of her questions have been answered.   The cervix was cleansed with betadine solution.  After sounding the uterus and noting the position,  the IUD was placed in the usual manner without problem.  The string was cut to the appropriate length.  The patient tolerated the procedure well.              Assessment:    [id number] Patient Active Problem List   Diagnosis Date Noted  . Blood  loss anemia 03/05/2017  . BV (bacterial vaginosis) 11/27/2012  . UTI (lower urinary tract infection) 11/27/2012  . Migraine with visual aura 09/19/2011  . Depression 09/19/2011  . Constipation 06/25/2011     1. Encounter for insertion of mirena IUD   2. DUB (dysfunctional uterine bleeding)       Plan:             F/U  Return in about 4 weeks (around 04/19/2017).  Finis Bud, M.D. 03/22/2017 3:58 PM

## 2017-03-29 ENCOUNTER — Telehealth: Payer: Self-pay | Admitting: Obstetrics and Gynecology

## 2017-03-29 NOTE — Telephone Encounter (Signed)
The patient called and stated that she missed a call from [facility] and would like a call back. Please advise.

## 2017-03-29 NOTE — Telephone Encounter (Signed)
The patient called and stated that she had an IUD put in last Tuesday, and since then she has been experiencing severe lower back pain, and her medicine that she ha/s been taking has not been helping. The patient would like to speak with a nurse to discuss her questions and concerns she has. No other information was disclosed. Please advise.

## 2017-03-30 ENCOUNTER — Other Ambulatory Visit: Payer: Self-pay | Admitting: Obstetrics and Gynecology

## 2017-03-30 ENCOUNTER — Encounter: Payer: Self-pay | Admitting: Obstetrics and Gynecology

## 2017-03-30 ENCOUNTER — Telehealth: Payer: Self-pay | Admitting: Obstetrics and Gynecology

## 2017-03-30 ENCOUNTER — Telehealth: Payer: Self-pay

## 2017-03-30 ENCOUNTER — Other Ambulatory Visit (INDEPENDENT_AMBULATORY_CARE_PROVIDER_SITE_OTHER): Payer: PRIVATE HEALTH INSURANCE

## 2017-03-30 ENCOUNTER — Ambulatory Visit (INDEPENDENT_AMBULATORY_CARE_PROVIDER_SITE_OTHER): Payer: PRIVATE HEALTH INSURANCE | Admitting: Obstetrics and Gynecology

## 2017-03-30 VITALS — BP 157/84 | HR 83 | Wt 151.3 lb

## 2017-03-30 DIAGNOSIS — Z975 Presence of (intrauterine) contraceptive device: Secondary | ICD-10-CM

## 2017-03-30 DIAGNOSIS — Z30431 Encounter for routine checking of intrauterine contraceptive device: Secondary | ICD-10-CM

## 2017-03-30 DIAGNOSIS — N921 Excessive and frequent menstruation with irregular cycle: Secondary | ICD-10-CM | POA: Diagnosis not present

## 2017-03-30 DIAGNOSIS — N946 Dysmenorrhea, unspecified: Secondary | ICD-10-CM | POA: Diagnosis not present

## 2017-03-30 DIAGNOSIS — N920 Excessive and frequent menstruation with regular cycle: Secondary | ICD-10-CM | POA: Diagnosis not present

## 2017-03-30 MED ORDER — NORETHINDRONE ACETATE 5 MG PO TABS: 5.0000 mg | ORAL_TABLET | Freq: Three times a day (TID) | ORAL | 0 refills | Status: DC

## 2017-03-30 NOTE — Telephone Encounter (Signed)
The patient called and stated that she missed a call from [facility] and she would like her call to be returned. The patient is experiencing back pain, and has been taking a variation of medications and has had no relief, The patient thinks it may be her IUD. Please advise.

## 2017-03-30 NOTE — Progress Notes (Signed)
HPI:      [name] is a 51 y.o. [phone] who LMP was No LMP recorded.  Subjective:   She presents today after calling in today stating that she had very heavy bleeding over the last 3 days.  And significant cramping associated with it.  She recently received an IUD and was taking Aygestin prior to IUD insertion.  Patient stopped Aygestin on Friday.  She was concerned that the IUD was malpositioned and causing her significant pain. She states this is not the worst.  That she has ever had and that she is used to very heavy periods like this sometimes.    Hx: The following portions of the patient's history were reviewed and updated as appropriate:             She  has a past medical history of Celiac disease, GERD (gastroesophageal reflux disease), Hypertension, Migraine with visual aura, and UTI (lower urinary tract infection). She does not have any pertinent problems on file. She  has a past surgical history that includes Tubal ligation; Exploratory laparotomy; and Bladder suspension. Her family history includes Diabetes (age of onset: 63) in her son. She  reports that  has never smoked. she has never used smokeless tobacco. She reports that she does not drink alcohol or use drugs. She is allergic to gluten meal and ondansetron.       Review of Systems:  Review of Systems  Constitutional: Denied constitutional symptoms, night sweats, recent illness, fatigue, fever, insomnia and weight loss.  Eyes: Denied eye symptoms, eye pain, photophobia, vision change and visual disturbance.  Ears/Nose/Throat/Neck: Denied ear, nose, throat or neck symptoms, hearing loss, nasal discharge, sinus congestion and sore throat.  Cardiovascular: Denied cardiovascular symptoms, arrhythmia, chest pain/pressure, edema, exercise intolerance, orthopnea and palpitations.  Respiratory: Denied pulmonary symptoms, asthma, pleuritic pain, productive sputum, cough, dyspnea and wheezing.  Gastrointestinal: Denied,  gastro-esophageal reflux, melena, nausea and vomiting.  Genitourinary: See HPI for additional information.  Musculoskeletal: Denied musculoskeletal symptoms, stiffness, swelling, muscle weakness and myalgia.  Dermatologic: Denied dermatology symptoms, rash and scar.  Neurologic: Denied neurology symptoms, dizziness, headache, neck pain and syncope.  Psychiatric: Denied psychiatric symptoms, anxiety and depression.  Endocrine: Denied endocrine symptoms including hot flashes and night sweats.   Meds:   Current Outpatient Medications on File Prior to Visit  Medication Sig Dispense Refill  . ibuprofen (ADVIL,MOTRIN) 200 MG tablet Take 200 mg by mouth every 6 (six) hours as needed.    . [DISCONTINUED] medroxyPROGESTERone (PROVERA) 10 MG tablet Take one tab daily D1-14 of each mos x 3 mos. (Patient not taking: Reported on 11/12/2014) 42 tablet 0   No current facility-administered medications on file prior to visit.     Objective:     Vitals:   03/30/17 1541  BP: (!) 157/84  Pulse: 83              Ultrasound results reviewed directly with the patient.  One fibroid appears to be submucous mildly distorting the endometrial cavity.  IUD appears to be positioned within the large endometrial cavity and not low in the cervix.  The IUD does not appear to be malpositioned by the fibroid.  Assessment:    [id number] Patient Active Problem List   Diagnosis Date Noted  . Blood loss anemia 03/05/2017  . BV (bacterial vaginosis) 11/27/2012  . UTI (lower urinary tract infection) 11/27/2012  . Migraine with visual aura 09/19/2011  . Depression 09/19/2011  . Constipation 06/25/2011     1.  Menorrhagia with regular cycle   2. Dysmenorrhea     It is likely she is having a very heavy cycle as she sheds the endometrium by stopping Aygestin and having an IUD.  Doubt mis placed IUD causing her issue.   Plan:            1.  Restart Aygestin and expect her bleeding to slow down rapidly and her cramping  to improve.  2.  NSAID for pain relief  3.  If patient continues to have this type of menses in the future strongly consider definitive surgery. Orders No orders of the defined types were placed in this encounter.    Meds ordered this encounter  Medications  . norethindrone (AYGESTIN) 5 MG tablet    Sig: Take 1 tablet (5 mg total) by mouth 3 (three) times daily for 21 days. As directed 3 times daily for 6 days then twice daily for 14 days.    Dispense:  42 tablet    Refill:  0      F/U  Return in about 3 weeks (around 04/20/2017).  [name], M.D. 03/30/2017 4:31 PM

## 2017-03-30 NOTE — Telephone Encounter (Signed)
Appointment made for today 03/30/17

## 2017-04-20 ENCOUNTER — Ambulatory Visit (INDEPENDENT_AMBULATORY_CARE_PROVIDER_SITE_OTHER): Payer: PRIVATE HEALTH INSURANCE | Admitting: Obstetrics and Gynecology

## 2017-04-20 ENCOUNTER — Encounter: Payer: Self-pay | Admitting: Obstetrics and Gynecology

## 2017-04-20 VITALS — BP 145/81 | HR 82 | Ht 60.0 in | Wt 153.2 lb

## 2017-04-20 DIAGNOSIS — N946 Dysmenorrhea, unspecified: Secondary | ICD-10-CM

## 2017-04-20 DIAGNOSIS — N920 Excessive and frequent menstruation with regular cycle: Secondary | ICD-10-CM

## 2017-04-20 MED ORDER — NORETHINDRONE ACETATE 5 MG PO TABS: 5.0000 mg | ORAL_TABLET | Freq: Every day | ORAL | 1 refills | Status: DC

## 2017-04-20 NOTE — Progress Notes (Signed)
HPI:      [name] is a 52 y.o. [phone] who LMP was No LMP recorded.  Subjective:   She presents today stating that she discontinued her Aygestin and she began bleeding.  She says the bleeding was initially heavy and she restarted the Aygestin.  At this time she has decided upon surgery.  She will have to clear this with work and would like to schedule it in the future after discussing this with her new employer.  Until that time she would like to continue at 1 Aygestin pill per day to help control her bleeding.    Hx: The following portions of the patient's history were reviewed and updated as appropriate:             She  has a past medical history of Celiac disease, GERD (gastroesophageal reflux disease), Hypertension, Migraine with visual aura, and UTI (lower urinary tract infection). She does not have any pertinent problems on file. She  has a past surgical history that includes Tubal ligation; Exploratory laparotomy; and Bladder suspension. Her family history includes Diabetes (age of onset: 46) in her son. She  reports that  has never smoked. she has never used smokeless tobacco. She reports that she does not drink alcohol or use drugs. She is allergic to gluten meal and ondansetron.       Review of Systems:  Review of Systems  Constitutional: Denied constitutional symptoms, night sweats, recent illness, fatigue, fever, insomnia and weight loss.  Eyes: Denied eye symptoms, eye pain, photophobia, vision change and visual disturbance.  Ears/Nose/Throat/Neck: Denied ear, nose, throat or neck symptoms, hearing loss, nasal discharge, sinus congestion and sore throat.  Cardiovascular: Denied cardiovascular symptoms, arrhythmia, chest pain/pressure, edema, exercise intolerance, orthopnea and palpitations.  Respiratory: Denied pulmonary symptoms, asthma, pleuritic pain, productive sputum, cough, dyspnea and wheezing.  Gastrointestinal: Denied, gastro-esophageal reflux, melena, nausea  and vomiting.  Genitourinary: Denied genitourinary symptoms including symptomatic vaginal discharge, pelvic relaxation issues, and urinary complaints.  Musculoskeletal: Denied musculoskeletal symptoms, stiffness, swelling, muscle weakness and myalgia.  Dermatologic: Denied dermatology symptoms, rash and scar.  Neurologic: Denied neurology symptoms, dizziness, headache, neck pain and syncope.  Psychiatric: Denied psychiatric symptoms, anxiety and depression.  Endocrine: Denied endocrine symptoms including hot flashes and night sweats.   Meds:   Current Outpatient Medications on File Prior to Visit  Medication Sig Dispense Refill  . ibuprofen (ADVIL,MOTRIN) 200 MG tablet Take 200 mg by mouth every 6 (six) hours as needed.    . norethindrone (AYGESTIN) 5 MG tablet Take 1 tablet (5 mg total) by mouth 3 (three) times daily for 21 days. As directed 3 times daily for 6 days then twice daily for 14 days. 42 tablet 0  . [DISCONTINUED] medroxyPROGESTERone (PROVERA) 10 MG tablet Take one tab daily D1-14 of each mos x 3 mos. (Patient not taking: Reported on 11/12/2014) 42 tablet 0   No current facility-administered medications on file prior to visit.     Objective:     Vitals:   04/20/17 1532  BP: (!) 145/81  Pulse: 82                Assessment:    [id number] Patient Active Problem List   Diagnosis Date Noted  . Blood loss anemia 03/05/2017  . BV (bacterial vaginosis) 11/27/2012  . UTI (lower urinary tract infection) 11/27/2012  . Migraine with visual aura 09/19/2011  . Depression 09/19/2011  . Constipation 06/25/2011     1. Menorrhagia with regular cycle  2. Dysmenorrhea     Patient with known uterine fibroids (submucosal).  Despite IUD in place and previous use of Aygestin patient continues to experience heavy bleeding and dysmenorrhea.   Plan:            1.  Continue Aygestin 1/day to control bleeding.  2.  Patient to discuss possible surgery with employer and choose future  date. Orders No orders of the defined types were placed in this encounter.    Meds ordered this encounter  Medications  . norethindrone (AYGESTIN) 5 MG tablet    Sig: Take 1 tablet (5 mg total) by mouth daily for 21 days. As directed    Dispense:  50 tablet    Refill:  1      F/U  No Follow-up on file. I spent 12 minutes with this patient of which greater than 50% was spent discussing bleeding, strategies to control bleeding, possible alternatives to surgery, surgery.  Finis Bud, M.D. 04/20/2017 4:19 PM

## 2017-04-22 ENCOUNTER — Encounter: Payer: PRIVATE HEALTH INSURANCE | Admitting: Obstetrics and Gynecology

## 2017-05-12 ENCOUNTER — Ambulatory Visit (HOSPITAL_COMMUNITY)
Admission: EM | Admit: 2017-05-12 | Discharge: 2017-05-12 | Disposition: A | Discharge status: Home / Self Care | Payer: PRIVATE HEALTH INSURANCE | Attending: Family Medicine | Admitting: Family Medicine

## 2017-05-12 ENCOUNTER — Encounter (HOSPITAL_COMMUNITY): Payer: Self-pay | Admitting: Emergency Medicine

## 2017-05-12 DIAGNOSIS — R300 Dysuria: Secondary | ICD-10-CM | POA: Diagnosis present

## 2017-05-12 DIAGNOSIS — R05 Cough: Secondary | ICD-10-CM | POA: Diagnosis not present

## 2017-05-12 DIAGNOSIS — K219 Gastro-esophageal reflux disease without esophagitis: Secondary | ICD-10-CM

## 2017-05-12 DIAGNOSIS — R059 Cough, unspecified: Secondary | ICD-10-CM

## 2017-05-12 DIAGNOSIS — Z1620 Resistance to unspecified antibiotic: Secondary | ICD-10-CM | POA: Diagnosis not present

## 2017-05-12 DIAGNOSIS — B9689 Other specified bacterial agents as the cause of diseases classified elsewhere: Secondary | ICD-10-CM | POA: Insufficient documentation

## 2017-05-12 DIAGNOSIS — B9620 Unspecified Escherichia coli [E. coli] as the cause of diseases classified elsewhere: Secondary | ICD-10-CM | POA: Insufficient documentation

## 2017-05-12 DIAGNOSIS — N390 Urinary tract infection, site not specified: Secondary | ICD-10-CM | POA: Insufficient documentation

## 2017-05-12 LAB — POCT URINALYSIS DIP (DEVICE)
Bilirubin Urine: NEGATIVE
Glucose, UA: NEGATIVE mg/dL
Ketones, ur: NEGATIVE mg/dL
Nitrite: POSITIVE — AB
Protein, ur: NEGATIVE mg/dL
Specific Gravity, Urine: 1.01 (ref 1.005–1.030)
Urobilinogen, UA: 0.2 mg/dL (ref 0.0–1.0)
pH: 6.5 (ref 5.0–8.0)

## 2017-05-12 MED ORDER — OMEPRAZOLE 20 MG PO CPDR: 20.0000 mg | DELAYED_RELEASE_CAPSULE | Freq: Every day | ORAL | 5 refills | Status: DC

## 2017-05-12 MED ORDER — CEPHALEXIN 500 MG PO CAPS: 500.0000 mg | ORAL_CAPSULE | Freq: Three times a day (TID) | ORAL | 0 refills | Status: DC

## 2017-05-12 NOTE — ED Provider Notes (Signed)
[place]   [id number] 05/12/17 Arrival Time: 8676   SUBJECTIVE:  [name] is a 52 y.o. female who presents to the urgent care with complaint of dysuria and frequency for over 1 month.   PT reports URI symptoms for 1 week.   She has esophageal reflux diagnosed in the past and wonders if that is causing some of the cough.  She has mild congestion as well as dry cough.  She has run out of her Prilosec.  Past Medical History:  Diagnosis Date  . Celiac disease   . GERD (gastroesophageal reflux disease)   . Hypertension   . Migraine with visual aura   . UTI (lower urinary tract infection)    Family History  Problem Relation Age of Onset  . Diabetes Son 40       type 1   Social History   Socioeconomic History  . Marital status: Married    Spouse name: Jenny Reichmann  . Number of children: 3  . Years of education: 50  . Highest education level: Not on file  Social Needs  . Financial resource strain: Not on file  . Food insecurity - worry: Not on file  . Food insecurity - inability: Not on file  . Transportation needs - medical: Not on file  . Transportation needs - non-medical: Not on file  Occupational History  . Occupation: Personnel officer  Tobacco Use  . Smoking status: Never Smoker  . Smokeless tobacco: Never Used  Substance and Sexual Activity  . Alcohol use: No    Alcohol/week: 0.0 oz  . Drug use: No  . Sexual activity: Yes    Partners: Female    Birth control/protection: Surgical    Comment: INTERCOURSE AGE 64, SEXUAL PARTNERS LEES THAN 5  Other Topics Concern  . Not on file  Social History Narrative   Lives with her husband and their three children, and her daughter's boyfriend.  Her older son's daughter lives there part-time as well.   Current Meds  Medication Sig  . norethindrone (AYGESTIN) 5 MG tablet Take 1 tablet (5 mg total) by mouth daily for 21 days. As directed   Allergies  Allergen Reactions  . Gluten Meal Diarrhea and  Nausea Only       . Ondansetron Other (See Comments)    Helps her nausea, but makes HA worse      ROS: As per HPI, remainder of ROS negative.   OBJECTIVE:   Vitals:   05/12/17 1559  BP: (!) 165/96  Pulse: 89  Resp: 16  Temp: 98.5 F (36.9 C)  TempSrc: Oral  SpO2: 97%  Weight: 155 lb (70.3 kg)  Height: 5' (1.524 m)     General appearance: alert; no distress Eyes: PERRL; EOMI; conjunctiva normal HENT: normocephalic; atraumatic; TMs serous fluid, canal normal, external ears normal without trauma; nasal mucosa normal; oral mucosa normal Neck: supple Lungs: clear to auscultation bilaterally Heart: regular rate and rhythm Back: no CVA tenderness Extremities: no cyanosis or edema; symmetrical with no gross deformities Skin: warm and dry Neurologic: normal gait; grossly normal Psychological: alert and cooperative; normal mood and affect      Labs:  Results for orders placed or performed during the hospital encounter of 05/12/17  POCT urinalysis dip (device)  Result Value Ref Range   Glucose, UA NEGATIVE NEGATIVE mg/dL   Bilirubin Urine NEGATIVE NEGATIVE   Ketones, ur NEGATIVE NEGATIVE mg/dL   Specific Gravity, Urine 1.010 1.005 - 1.030   Hgb urine  dipstick TRACE (A) NEGATIVE   pH 6.5 5.0 - 8.0   Protein, ur NEGATIVE NEGATIVE mg/dL   Urobilinogen, UA 0.2 0.0 - 1.0 mg/dL   Nitrite POSITIVE (A) NEGATIVE   Leukocytes, UA SMALL (A) NEGATIVE    Labs Reviewed  POCT URINALYSIS DIP (DEVICE) - Abnormal; Notable for the following components:      Result Value   Hgb urine dipstick TRACE (*)    Nitrite POSITIVE (*)    Leukocytes, UA SMALL (*)    All other components within normal limits    No results found.     ASSESSMENT & PLAN:  1. Cough   2. Gastroesophageal reflux disease without esophagitis   3. Lower urinary tract infectious disease     Meds ordered this encounter  Medications  . cephALEXin (KEFLEX) 500 MG capsule    Sig: Take 1 capsule (500 mg  total) by mouth 3 (three) times daily.    Dispense:  20 capsule    Refill:  0  . omeprazole (PRILOSEC) 20 MG capsule    Sig: Take 1 capsule (20 mg total) by mouth daily.    Dispense:  30 capsule    Refill:  5    Reviewed expectations re: course of current medical issues. Questions answered. Outlined signs and symptoms indicating need for more acute intervention. Patient verbalized understanding. After Visit Summary given.    Procedures:      Robyn Haber, MD 05/12/17 1635

## 2017-05-12 NOTE — ED Triage Notes (Signed)
PT reports dysuria and frequency for over 1 month.   PT reports URI symptoms for 1 week.

## 2017-05-15 LAB — URINE CULTURE: Culture: >=100000 — AB

## 2017-05-16 ENCOUNTER — Telehealth (HOSPITAL_COMMUNITY): Payer: Self-pay | Admitting: Internal Medicine

## 2017-05-16 MED ORDER — NITROFURANTOIN MONOHYD MACRO 100 MG PO CAPS: 100.0000 mg | ORAL_CAPSULE | Freq: Two times a day (BID) | ORAL | 0 refills | Status: DC

## 2017-05-16 NOTE — Telephone Encounter (Signed)
Clinical staff, please let patient know that urine culture was positive for E coli germ, resistant to cephalexin rx given at the urgent care visit but sensitive to nitrofurantoin.  Stop cephalexin.  Rx nitrofurantoin sent to the pharmacy of record, [facility] on [address] in [place].  Recheck for further evaluation if symptoms are not improving.  LM

## 2017-05-29 ENCOUNTER — Other Ambulatory Visit: Payer: Self-pay | Admitting: Obstetrics and Gynecology

## 2017-05-29 DIAGNOSIS — N920 Excessive and frequent menstruation with regular cycle: Secondary | ICD-10-CM

## 2017-06-06 ENCOUNTER — Other Ambulatory Visit: Payer: Self-pay

## 2017-06-06 DIAGNOSIS — N920 Excessive and frequent menstruation with regular cycle: Secondary | ICD-10-CM

## 2017-06-06 MED ORDER — NORETHINDRONE ACETATE 5 MG PO TABS: 5.0000 mg | ORAL_TABLET | Freq: Three times a day (TID) | ORAL | 0 refills | Status: DC

## 2017-06-22 ENCOUNTER — Other Ambulatory Visit: Payer: Self-pay | Admitting: *Deleted

## 2017-06-22 DIAGNOSIS — N920 Excessive and frequent menstruation with regular cycle: Secondary | ICD-10-CM

## 2017-06-22 MED ORDER — NORETHINDRONE ACETATE 5 MG PO TABS: 5.0000 mg | ORAL_TABLET | Freq: Every day | ORAL | 1 refills | Status: DC

## 2017-06-24 ENCOUNTER — Encounter: Payer: Self-pay | Admitting: *Deleted

## 2017-06-24 DIAGNOSIS — Z79899 Other long term (current) drug therapy: Secondary | ICD-10-CM | POA: Diagnosis not present

## 2017-06-24 DIAGNOSIS — I10 Essential (primary) hypertension: Secondary | ICD-10-CM | POA: Diagnosis not present

## 2017-06-24 DIAGNOSIS — N938 Other specified abnormal uterine and vaginal bleeding: Secondary | ICD-10-CM | POA: Diagnosis not present

## 2017-06-24 DIAGNOSIS — R1030 Lower abdominal pain, unspecified: Secondary | ICD-10-CM | POA: Diagnosis present

## 2017-06-24 LAB — CBC
HCT: 38.9 % (ref 35.0–47.0)
Hemoglobin: 12.6 g/dL (ref 12.0–16.0)
MCH: 25.9 pg — ABNORMAL LOW (ref 26.0–34.0)
MCHC: 32.3 g/dL (ref 32.0–36.0)
MCV: 80.2 fL (ref 80.0–100.0)
Platelets: 335 10*3/uL (ref 150–440)
RBC: 4.86 MIL/uL (ref 3.80–5.20)
RDW: 25.9 % — ABNORMAL HIGH (ref 11.5–14.5)
WBC: 8.4 10*3/uL (ref 3.6–11.0)

## 2017-06-24 LAB — COMPREHENSIVE METABOLIC PANEL
ALT: 53 U/L (ref 14–54)
AST: 36 U/L (ref 15–41)
Albumin: 3.6 g/dL (ref 3.5–5.0)
Alkaline Phosphatase: 45 U/L (ref 38–126)
Anion gap: 10 (ref 5–15)
BUN: 18 mg/dL (ref 6–20)
CO2: 22 mmol/L (ref 22–32)
Calcium: 9 mg/dL (ref 8.9–10.3)
Chloride: 105 mmol/L (ref 101–111)
Creatinine, Ser: 0.65 mg/dL (ref 0.44–1.00)
GFR calc Af Amer: >60 mL/min (ref >60)
GFR calc non Af Amer: >60 mL/min (ref >60)
Glucose, Bld: 96 mg/dL (ref 65–99)
Potassium: 4.1 mmol/L (ref 3.5–5.1)
Sodium: 137 mmol/L (ref 135–145)
Total Bilirubin: 0.4 mg/dL (ref 0.3–1.2)
Total Protein: 7 g/dL (ref 6.5–8.1)

## 2017-06-24 LAB — URINALYSIS, COMPLETE (UACMP) WITH MICROSCOPIC
Bacteria, UA: NONE SEEN
Bilirubin Urine: NEGATIVE
Glucose, UA: NEGATIVE mg/dL
Hgb urine dipstick: NEGATIVE
Ketones, ur: NEGATIVE mg/dL
Leukocytes, UA: NEGATIVE
Nitrite: NEGATIVE
Protein, ur: NEGATIVE mg/dL
Specific Gravity, Urine: 1.011 (ref 1.005–1.030)
Squamous Epithelial / LPF: NONE SEEN
pH: 6 (ref 5.0–8.0)

## 2017-06-24 LAB — POCT PREGNANCY, URINE: Preg Test, Ur: NEGATIVE

## 2017-06-24 NOTE — ED Triage Notes (Addendum)
Pt reports she has an IUD and is taking hormone replacement (norethindrone 5mg ) for vaginal bleeding and is due to have hysterectomy at some point. Pt says she ran out of her hormone pills (for 5 days) and started taking them on Saturday again. Since then, she has been having lower left abdominal pain and sharp lower back pain that has become worse. Last [place] for pain at 1630 today. Denies urinary symptoms or fevers.

## 2017-06-25 ENCOUNTER — Emergency Department
Admission: EM | Admit: 2017-06-25 | Discharge: 2017-06-25 | Disposition: A | Discharge status: Home / Self Care | Payer: PRIVATE HEALTH INSURANCE | Attending: Emergency Medicine | Admitting: Emergency Medicine

## 2017-06-25 ENCOUNTER — Emergency Department: Payer: PRIVATE HEALTH INSURANCE

## 2017-06-25 DIAGNOSIS — N938 Other specified abnormal uterine and vaginal bleeding: Secondary | ICD-10-CM

## 2017-06-25 DIAGNOSIS — R102 Pelvic and perineal pain: Secondary | ICD-10-CM

## 2017-06-25 MED ORDER — KETOROLAC TROMETHAMINE 30 MG/ML IJ SOLN
30.0000 mg | Freq: Once | INTRAMUSCULAR | Status: AC
  Administered 2017-06-25: 30 mg via INTRAMUSCULAR
  Filled 2017-06-25: qty 1

## 2017-06-25 MED ORDER — TRAMADOL HCL 50 MG PO TABS
50.0000 mg | ORAL_TABLET | Freq: Once | ORAL | Status: AC
  Administered 2017-06-25: 50 mg via ORAL
  Filled 2017-06-25: qty 1

## 2017-06-25 MED ORDER — TRAMADOL HCL 50 MG PO TABS: 50.0000 mg | ORAL_TABLET | Freq: Four times a day (QID) | ORAL | 0 refills | Status: AC | PRN

## 2017-06-25 MED ORDER — IBUPROFEN 600 MG PO TABS: 600.0000 mg | ORAL_TABLET | Freq: Four times a day (QID) | ORAL | 0 refills | Status: DC | PRN

## 2017-06-25 NOTE — ED Provider Notes (Signed)
[name] Emergency Department Provider Note ____________________________________________   First MD Initiated Contact with Patient 06/25/17 0131     (approximate)  I have reviewed the triage vital signs and the nursing notes.   HISTORY  Chief Complaint Abdominal Pain    HPI [name] is a 52 y.o. female with past medical history as noted below who presents with lower abdominal pain over the last several [name], [name], [name] but now more constant, associated with vaginal bleeding.  No associated vomiting, diarrhea, or urinary symptoms.  The patient reports chronic vaginal bleeding for which she is on hormone replacement therapy.  She states that she had run out of them for several [name] and then resumed last week, but the pain and bleeding which recurred while she was off the medication persisted since she restarted.  Past Medical History:  Diagnosis Date  . Celiac disease   . GERD (gastroesophageal reflux disease)   . Hypertension   . Migraine with visual aura   . UTI (lower urinary tract infection)     Patient Active Problem List   Diagnosis Date Noted  . Blood loss anemia 03/05/2017  . BV (bacterial vaginosis) 11/27/2012  . UTI (lower urinary tract infection) 11/27/2012  . Migraine with visual aura 09/19/2011  . Depression 09/19/2011  . Constipation 06/25/2011    Past Surgical History:  Procedure Laterality Date  . BLADDER SUSPENSION    . EXPLORATORY LAPAROTOMY    . TUBAL LIGATION      Prior to Admission medications   Medication Sig Start Date End Date Taking? Authorizing Provider  cephALEXin (KEFLEX) 500 MG capsule Take 1 capsule (500 mg total) by mouth 3 (three) times daily. 05/12/17   Robyn Haber, MD  ibuprofen (ADVIL,MOTRIN) 200 MG tablet Take 200 mg by mouth every 6 (six) hours as needed.    [provider]  nitrofurantoin, macrocrystal-monohydrate, (MACROBID) 100 MG capsule Take 1 capsule  (100 mg total) by mouth 2 (two) times daily. For UTI, resistant to cephalexin, on urine culture. 05/16/17   Wynona Luna, MD  norethindrone (AYGESTIN) 5 MG tablet Take 1 tablet (5 mg total) by mouth 3 (three) times daily for 21 [name]. As directed 3 times daily for 6 [name] then twice daily for 14 [name]. 06/06/17 06/27/17  Harlin Heys, MD  norethindrone (AYGESTIN) 5 MG tablet Take 1 tablet (5 mg total) by mouth daily for 21 [name]. As directed 06/22/17 07/13/17  Harlin Heys, MD  omeprazole (PRILOSEC) 20 MG capsule Take 1 capsule (20 mg total) by mouth daily. 05/12/17   Robyn Haber, MD    Allergies Gluten meal and Ondansetron  Family History  Problem Relation Age of [name]  . Diabetes Son 61       type 1    Social History Social History   Tobacco Use  . Smoking status: Never Smoker  . Smokeless tobacco: Never Used  Substance Use Topics  . Alcohol use: No    Alcohol/week: 0.0 oz  . Drug use: No    Review of Systems  Constitutional: No fever Eyes: No redness. ENT: No sore throat. Cardiovascular: Denies chest pain. Respiratory: Denies shortness of breath. Gastrointestinal: No vomiting.  Positive for abdominal pain..  Genitourinary: Negative for dysuria.  Positive for vaginal bleeding. Musculoskeletal: Negative for back pain. Skin: Negative for rash. Neurological: Negative for headache.   ____________________________________________   PHYSICAL EXAM:  VITAL SIGNS: ED Triage Vitals  Enc Vitals Group     BP 06/24/17  2023 (!) 167/90     Pulse Rate 06/24/17 2023 78     Resp 06/24/17 2023 18     Temp 06/24/17 2023 98.5 F (36.9 C)     Temp Source 06/24/17 2023 Oral     SpO2 06/24/17 2023 96 %     Weight 06/24/17 2028 160 lb (72.6 kg)     Height 06/24/17 2028 5' (1.524 m)     Head Circumference --      Peak Flow --      Pain Score 06/24/17 2028 10     Pain Loc --      Pain Edu? --      Excl. in [place]? --     Constitutional: Alert and oriented. Well  appearing and in no acute distress. Eyes: Conjunctivae are normal.  Head: Atraumatic. Nose: No congestion/rhinnorhea. Mouth/Throat: Mucous membranes are moist.   Neck: Normal range of motion.  Cardiovascular: Good peripheral circulation. Respiratory: Normal respiratory effort.   Gastrointestinal: Soft with bilateral suprapubic mild tenderness. Genitourinary: No CVA or flank tenderness. Musculoskeletal: No lower extremity edema.  Extremities warm and well perfused.  Neurologic:  Normal speech and language. No gross focal neurologic deficits are appreciated.  Skin:  Skin is warm and dry. No rash noted. Psychiatric: Mood and affect are normal. Speech and behavior are normal.  ____________________________________________   LABS (all labs ordered are listed, but only abnormal results are displayed)  Labs Reviewed  CBC - Abnormal; Notable for the following components:      Result Value   MCH 25.9 (*)    RDW 25.9 (*)    All other components within normal limits  URINALYSIS, COMPLETE (UACMP) WITH MICROSCOPIC - Abnormal; Notable for the following components:   Color, Urine STRAW (*)    APPearance CLEAR (*)    All other components within normal limits  COMPREHENSIVE METABOLIC PANEL  POCT PREGNANCY, URINE   ____________________________________________  EKG   ____________________________________________  RADIOLOGY  US pelvis: Malpositioned low-lying IUD.  Adenomyosis of uterus.  Small fibroid.  ____________________________________________   PROCEDURES  Procedure(s) performed: No  Procedures  Critical Care performed: No ____________________________________________   INITIAL IMPRESSION / ASSESSMENT AND PLAN / ED COURSE  Pertinent labs & imaging results that were available during my care of the patient were reviewed by me and considered in my medical decision making (see chart for details).  52 year old female with past medical history as noted above on hormone therapy  for chronic vaginal bleeding presents with vaginal bleeding and lower abdominal pain over the last several [name] after being off of the medication for several [name] when she ran out, and resuming last week.  On exam, the patient is  Relatively well-appearing, vital signs are normal except for hypertension, and the remainder the exam is as described above.  The patient has bilateral mild suprapubic tenderness but no other significant abdominal tenderness.  Pelvic exam deferred as patient has no acute severe hemorrhage and she declines the exam at this time.  Presentation is consistent with dysfunctional uterine bleeding, versus endometriosis, fibroid, or other exacerbation of chronic cause, or possible UTI/cystitis.  We will obtain a pelvic ultrasound to evaluate for any acute causes, as well as basic labs, UA, and reassess.    ----------------------------------------- 4:07 AM on 06/25/2017 -----------------------------------------  Ultrasound does not show any concerning acute findings.  The IUD is somewhat misplaced but patient states she has been told this previously.  There is no evidence of other acute complication.  Her pain is well  controlled with Toradol here, and she remained stable.  She feels well to go home.  She agrees to call her gynecologist on Monday to arrange for follow-up.  Return precautions given and the patient expresses understanding.  ____________________________________________   FINAL CLINICAL IMPRESSION(S) / ED DIAGNOSES  Final diagnoses:  Pelvic pain  Dysfunctional uterine bleeding      NEW MEDICATIONS STARTED DURING THIS VISIT:  New Prescriptions   No medications on file     Note:  This document was prepared using Dragon voice recognition software and may include unintentional dictation errors.    Arta Silence, MD 06/25/17 [phone]

## 2017-06-25 NOTE — Discharge Instructions (Signed)
Call your gynecologist on Monday to arrange for follow-up.  Return to the emergency department for new, worsening, or persistent severe pain, bleeding, weakness or lightheadedness, or any other new or worsening symptoms that concern you.  You may take the ibuprofen up to every 6 hours for pain, and you can take the tramadol additionally for more severe pain.  Do not take the ibuprofen at the same time as other NSAIDs such as Aleve.

## 2017-06-27 ENCOUNTER — Ambulatory Visit (INDEPENDENT_AMBULATORY_CARE_PROVIDER_SITE_OTHER): Payer: PRIVATE HEALTH INSURANCE | Admitting: Obstetrics and Gynecology

## 2017-06-27 ENCOUNTER — Encounter: Payer: Self-pay | Admitting: Obstetrics and Gynecology

## 2017-06-27 VITALS — BP 152/86 | HR 97 | Ht 60.0 in | Wt 157.4 lb

## 2017-06-27 DIAGNOSIS — D219 Benign neoplasm of connective and other soft tissue, unspecified: Secondary | ICD-10-CM

## 2017-06-27 DIAGNOSIS — N938 Other specified abnormal uterine and vaginal bleeding: Secondary | ICD-10-CM | POA: Diagnosis not present

## 2017-06-27 DIAGNOSIS — N920 Excessive and frequent menstruation with regular cycle: Secondary | ICD-10-CM | POA: Diagnosis not present

## 2017-06-27 DIAGNOSIS — N946 Dysmenorrhea, unspecified: Secondary | ICD-10-CM

## 2017-06-27 NOTE — Progress Notes (Signed)
HPI:      [name] is a 52 y.o. [phone] who LMP was No LMP recorded (lmp unknown).  Subjective:   She presents today as an ED follow-up.  Patient went to the emergency department because she was having left-sided pain and bleeding.  She continues to experience intermittent bleeding with Aygestin and with IUD inserted. Ultrasound reveals IUD in the lower uterine segment.  Fibroids noted. The patient has decided that she would like to have definitive surgery at this time.  She is tired of dealing with the pain and irregular bleeding despite IUD and Aygestin coincidently.    Hx: The following portions of the patient's history were reviewed and updated as appropriate:             She  has a past medical history of Celiac disease, GERD (gastroesophageal reflux disease), Hypertension, Migraine with visual aura, and UTI (lower urinary tract infection). She does not have any pertinent problems on file. She  has a past surgical history that includes Tubal ligation; Exploratory laparotomy; and Bladder suspension. Her family history includes Diabetes (age of onset: 3) in her son. She  reports that  has never smoked. she has never used smokeless tobacco. She reports that she does not drink alcohol or use drugs. She has a current medication list which includes the following prescription(s): ibuprofen, norethindrone, omeprazole, and tramadol. She is allergic to gluten meal and ondansetron.       Review of Systems:  Review of Systems  Constitutional: Denied constitutional symptoms, night sweats, recent illness, fatigue, fever, insomnia and weight loss.  Eyes: Denied eye symptoms, eye pain, photophobia, vision change and visual disturbance.  Ears/Nose/Throat/Neck: Denied ear, nose, throat or neck symptoms, hearing loss, nasal discharge, sinus congestion and sore throat.  Cardiovascular: Denied cardiovascular symptoms, arrhythmia, chest pain/pressure, edema, exercise intolerance, orthopnea and  palpitations.  Respiratory: Denied pulmonary symptoms, asthma, pleuritic pain, productive sputum, cough, dyspnea and wheezing.  Gastrointestinal: Denied, gastro-esophageal reflux, melena, nausea and vomiting.  Genitourinary: See HPI for additional information.  Musculoskeletal: Denied musculoskeletal symptoms, stiffness, swelling, muscle weakness and myalgia.  Dermatologic: Denied dermatology symptoms, rash and scar.  Neurologic: Denied neurology symptoms, dizziness, headache, neck pain and syncope.  Psychiatric: Denied psychiatric symptoms, anxiety and depression.  Endocrine: Denied endocrine symptoms including hot flashes and night sweats.   Meds:   Current Outpatient Medications on File Prior to Visit  Medication Sig Dispense Refill  . ibuprofen (ADVIL,MOTRIN) 600 MG tablet Take 1 tablet (600 mg total) by mouth every 6 (six) hours as needed. 30 tablet 0  . norethindrone (AYGESTIN) 5 MG tablet Take 1 tablet (5 mg total) by mouth 3 (three) times daily for 21 days. As directed 3 times daily for 6 days then twice daily for 14 days. 42 tablet 0  . omeprazole (PRILOSEC) 20 MG capsule Take 1 capsule (20 mg total) by mouth daily. 30 capsule 5  . traMADol (ULTRAM) 50 MG tablet Take 1 tablet (50 mg total) by mouth every 6 (six) hours as needed for up to 5 days for moderate pain. 20 tablet 0   No current facility-administered medications on file prior to visit.     Objective:     Vitals:   06/27/17 1058  BP: (!) 152/86  Pulse: 97              Physical examination   Pelvic:   Vulva: Normal appearance.  No lesions.  Vagina: No lesions or abnormalities noted.  Support: Normal pelvic support.  Urethra No masses tenderness or scarring.  Meatus Normal size without lesions or prolapse.  Cervix: Normal appearance.  No lesions.  IUD strings could not be noted at the external cervical loss.  Passing a tonsil clamp could not palpate the IUD.  Anus: Normal exam.  No lesions.  Perineum: Normal  exam.  No lesions.        Bimanual   Uterus: Normal size.  Non-tender.  Mobile.  AV.  Adnexae: No masses.  Non-tender to palpation.  Cul-de-sac: Negative for abnormality.     Assessment:    [id number] Patient Active Problem List   Diagnosis Date Noted  . Blood loss anemia 03/05/2017  . BV (bacterial vaginosis) 11/27/2012  . UTI (lower urinary tract infection) 11/27/2012  . Migraine with visual aura 09/19/2011  . Depression 09/19/2011  . Constipation 06/25/2011     1. Menorrhagia with regular cycle   2. Dysmenorrhea   3. Fibroid   4. DUB (dysfunctional uterine bleeding)     Patient desires definitive surgery   Plan:            1.  Had planned to attempt to further insert the IUD out of the lower uterine segment but it could not be visualized or palpated including use of a tonsil clamp.  Because patient has decided upon definitive surgery I think this is not immediately necessary nor is complete identification of the IUD in the uterus necessary.  Did not attempt replacement aggressively.  2.  Schedule preop for LAV[facility] BSO. Orders No orders of the defined types were placed in this encounter.   No orders of the defined types were placed in this encounter.     F/U  Return in about 1 week (around 07/04/2017).  Finis Bud, M.D. 06/27/2017 11:32 AM

## 2017-07-05 ENCOUNTER — Ambulatory Visit (INDEPENDENT_AMBULATORY_CARE_PROVIDER_SITE_OTHER): Payer: PRIVATE HEALTH INSURANCE | Admitting: Obstetrics and Gynecology

## 2017-07-05 ENCOUNTER — Encounter: Payer: Self-pay | Admitting: Obstetrics and Gynecology

## 2017-07-05 ENCOUNTER — Other Ambulatory Visit: Payer: Self-pay | Admitting: Obstetrics and Gynecology

## 2017-07-05 ENCOUNTER — Other Ambulatory Visit (INDEPENDENT_AMBULATORY_CARE_PROVIDER_SITE_OTHER): Payer: PRIVATE HEALTH INSURANCE

## 2017-07-05 VITALS — BP 141/76 | HR 77 | Ht 60.0 in | Wt 156.4 lb

## 2017-07-05 DIAGNOSIS — T8332XD Displacement of intrauterine contraceptive device, subsequent encounter: Secondary | ICD-10-CM

## 2017-07-05 DIAGNOSIS — N920 Excessive and frequent menstruation with regular cycle: Secondary | ICD-10-CM | POA: Diagnosis not present

## 2017-07-05 DIAGNOSIS — Z30431 Encounter for routine checking of intrauterine contraceptive device: Secondary | ICD-10-CM

## 2017-07-05 DIAGNOSIS — Z01818 Encounter for other preprocedural examination: Secondary | ICD-10-CM

## 2017-07-05 DIAGNOSIS — N946 Dysmenorrhea, unspecified: Secondary | ICD-10-CM

## 2017-07-05 DIAGNOSIS — D219 Benign neoplasm of connective and other soft tissue, unspecified: Secondary | ICD-10-CM

## 2017-07-05 MED ORDER — METRONIDAZOLE 500 MG PO TABS: 500.0000 mg | ORAL_TABLET | Freq: Two times a day (BID) | ORAL | 0 refills | Status: DC

## 2017-07-05 NOTE — Progress Notes (Signed)
PRE-OPERATIVE HISTORY AND PHYSICAL EXAM  PCP:  Patient, No Pcp Per Subjective:   HPI:  [name] is a 52 y.o. [id number].  No LMP recorded (lmp unknown).  She presents today for a pre-op discussion and PE.  She has the following symptoms: Pelvic cramping, dysfunctional bleeding despite IUD and Aygestin.  Generally uncomfortable with intermittent pelvic pain. Significant history includes a history of "some type of bladder sling procedure". Previous abdominal surgery-tubal ligation.  Review of Systems:   Constitutional: Denied constitutional symptoms, night sweats, recent illness, fatigue, fever, insomnia and weight loss.  Eyes: Denied eye symptoms, eye pain, photophobia, vision change and visual disturbance.  Ears/Nose/Throat/Neck: Denied ear, nose, throat or neck symptoms, hearing loss, nasal discharge, sinus congestion and sore throat.  Cardiovascular: Denied cardiovascular symptoms, arrhythmia, chest pain/pressure, edema, exercise intolerance, orthopnea and palpitations.  Respiratory: Denied pulmonary symptoms, asthma, pleuritic pain, productive sputum, cough, dyspnea and wheezing.  Gastrointestinal: Denied, gastro-esophageal reflux, melena, nausea and vomiting.  Genitourinary: Denied genitourinary symptoms including symptomatic vaginal discharge, pelvic relaxation issues, and urinary complaints.  Musculoskeletal: Denied musculoskeletal symptoms, stiffness, swelling, muscle weakness and myalgia.  Dermatologic: Denied dermatology symptoms, rash and scar.  Neurologic: Denied neurology symptoms, dizziness, headache, neck pain and syncope.  Psychiatric: Denied psychiatric symptoms, anxiety and depression.  Endocrine: Denied endocrine symptoms including hot flashes and night sweats.   OB History  Gravida Para Term Preterm AB Living  3 3 3     3   SAB TAB Ectopic Multiple Live Births          3    # Outcome Date GA Lbr Len/2nd Weight Sex Delivery Anes PTL Lv  3 Term 1992   7 lb 4  oz (3.289 kg) F Vag-Spont  N LIV  2 Term 1989   7 lb 12 oz (3.515 kg) M Vag-Spont  N LIV  1 Term 1988   8 lb 4.5 oz (3.756 kg) M Vag-Spont  N LIV    Past Medical History:  Diagnosis Date  . Celiac disease   . GERD (gastroesophageal reflux disease)   . Hypertension   . Migraine with visual aura   . UTI (lower urinary tract infection)     Past Surgical History:  Procedure Laterality Date  . BLADDER SUSPENSION    . EXPLORATORY LAPAROTOMY    . TUBAL LIGATION        SOCIAL HISTORY:  Social History   Tobacco Use  Smoking Status Never Smoker  Smokeless Tobacco Never Used   Social History   Substance and Sexual Activity  Alcohol Use No  . Alcohol/week: 0.0 oz    Social History   Substance and Sexual Activity  Drug Use No    Family History  Problem Relation Age of Onset  . Diabetes Son 12       type 1    ALLERGIES:  Gluten meal and Ondansetron  MEDS:   Current Outpatient Medications on File Prior to Visit  Medication Sig Dispense Refill  . ibuprofen (ADVIL,MOTRIN) 600 MG tablet Take 1 tablet (600 mg total) by mouth every 6 (six) hours as needed. 30 tablet 0  . Naproxen Sod-diphenhydrAMINE (ALEVE PM) 220-25 MG TABS Take by mouth.    . norethindrone (AYGESTIN) 5 MG tablet Take 5 mg by mouth daily.    Marland Kitchen omeprazole (PRILOSEC) 20 MG capsule Take 1 capsule (20 mg total) by mouth daily. 30 capsule 5  . norethindrone (AYGESTIN) 5 MG tablet Take 1 tablet (5 mg total) by  mouth 3 (three) times daily for 21 days. As directed 3 times daily for 6 days then twice daily for 14 days. 42 tablet 0   No current facility-administered medications on file prior to visit.     Meds ordered this encounter  Medications  . metroNIDAZOLE (FLAGYL) 500 MG tablet    Sig: Take 1 tablet (500 mg total) by mouth 2 (two) times daily. Begin 5 days prior to scheduled surgery as directed.    Dispense:  10 tablet    Refill:  0     Physical examination BP (!) 141/76   Pulse 77   Ht 5' (1.524  m)   Wt 156 lb 6 oz (70.9 kg)   LMP  (LMP Unknown)   BMI 30.54 kg/m   General NAD, Conversant  HEENT Atraumatic; Op clear with mmm.  Normo-cephalic. Pupils reactive. Anicteric sclerae  Thyroid/Neck Smooth without nodularity or enlargement. Normal ROM.  Neck Supple.  Skin No rashes, lesions or ulceration. Normal palpated skin turgor. No nodularity.  Breasts: No masses or discharge.  Symmetric.  No axillary adenopathy.  Lungs: Clear to auscultation.No rales or wheezes. Normal Respiratory effort, no retractions.  Heart: NSR.  No murmurs or rubs appreciated. No periferal edema  Abdomen: Soft.  Non-tender.  No masses.  No HSM. No hernia  Extremities: Moves all appropriately.  Normal ROM for age. No lymphadenopathy.  Neuro: Oriented to PPT.  Normal mood. Normal affect.     Pelvic:   Vulva: Normal appearance.  No lesions.  Vagina: No lesions or abnormalities noted.  Support: 2nd degree cystocele and rectocelee   Urethra No masses tenderness or scarring.  Meatus Normal size without lesions or prolapse.  Cervix: Normal ectropion.  No lesions.  No IUD strings noted at the office or with exploration of the uterus.  Anus: Normal exam.  No lesions.  Perineum: Normal exam.  No lesions.        Bimanual   Uterus:  10 weeks size Tender    Mobile.  AV.  Adnexae: No masses.  Non-tender to palpation.  Cul-de-sac: Negative for abnormality.   Assessment:   [id number] Patient Active Problem List   Diagnosis Date Noted  . Blood loss anemia 03/05/2017  . BV (bacterial vaginosis) 11/27/2012  . UTI (lower urinary tract infection) 11/27/2012  . Migraine with visual aura 09/19/2011  . Depression 09/19/2011  . Constipation 06/25/2011    1. Preop examination   2. Menorrhagia with regular cycle   3. Dysmenorrhea   4. Intrauterine contraceptive device threads lost, subsequent encounter   5. Fibroid      Plan:   Orders: Meds ordered this encounter  Medications  . metroNIDAZOLE (FLAGYL) 500 MG  tablet    Sig: Take 1 tablet (500 mg total) by mouth 2 (two) times daily. Begin 5 days prior to scheduled surgery as directed.    Dispense:  10 tablet    Refill:  0     1.  laparoscopic assisted vaginal hysterectomy and bilateral oophorectomy  Pre-op discussions regarding Risks and Benefits of her scheduled surgery.  LAVH The procedure of Laparoscopic Assisted Vaginal Hysterectomy was described to the patient in detail.  We reviewed the rationale for Hysterectomy and the patient was again informed of other nonsurgical management possibilities for her condition.  She has considered these other options, and desires a Hysterectomy.  We have reviewed the fact that Hysterectomy is permanent and that following the procedure she will not be able to become pregnant or bear children.  We have discussed the following risk factors specifically and the patient has also been informed that additional complications not mentioned may develop:  Damage to bowel, bladder, ureters or to other internal organs, bleeding, infection and the risk from anesthesia.  We have discussed the procedure itself in detail and she has an informed understanding of this surgery.  We have also discussed the recovery period in which physical and sexual activity will be restricted for a varying degree of time, often 3 - 6 weeks. The Laparoscopic Portion of Hysterectomy has also been reviewed with the patient.  She understands how the laparoscope facilitates the procedure.  We have discussed the abdominal incisions and punctures that will be used.  We have also reviewed the increased Operating Room time often accompanying LAVH.  The slightly increased risk of complications secondary to abdominal punctures, and use of laparoscopic instrumentation has also been discussed in detail.I have answered all of her questions and I believe the patient has an informed understanding of the procedure of Laparoscopic Assisted Vaginal  Hysterectomy. Oophorectomy The option of Oophorectomy has been discussed with the patient.  Detailed risk/benefits have been reviewed.  The risks discussed include, but are not limited to, hemorrhage, infection, damage to ureter or other internal organ, and Ovarian Remnant Syndrome.  The benefits include a significant decrease in the risk of Ovarian Cancer and in benign Ovarian disease.  The risk of Ovarian CA has been estimated at 1 in 51.  This is a relatively small risk.  However, should Ovarian CA develop, it is often found late in the course of the disease.  We have also discussed the role of inheritance in the development of Ovarian disease.  Some women, who have close relatives with Ovarian CA, have a higher than 1 in 70 risk of Ovarian CA.  The benefits of Estrogen replacement therapy following Oophorectomy has been stressed.  If she is premenopausal, we have discussed the fact that this procedure will make her permanently sterile and that premature menopause will result if no ERT is begun.  I have answered all of her questions, and I believe that she has an adequate and informed understanding of the risks and benefits of Oophorectomy. Patient strongly desires bilateral oophorectomy.  At the time of surgery we will remove her IUD be it Intrauterine or intra-abdominal.   I have discussed this in detail with her. Ultrasound today to confirm IUD located within the uterus. Finis Bud, M.D. 07/05/2017 10:26 AM

## 2017-07-05 NOTE — H&P (Signed)
PRE-OPERATIVE HISTORY AND PHYSICAL EXAM  PCP:  Patient, No Pcp Per Subjective:   HPI:  [name] is a 52 y.o. [id number].  No LMP recorded (lmp unknown).  She presents today for a pre-op discussion and PE.  She has the following symptoms: Pelvic cramping, dysfunctional bleeding despite IUD and Aygestin.  Generally uncomfortable with intermittent pelvic pain. Significant history includes a history of "some type of bladder sling procedure". Previous abdominal surgery-tubal ligation.  Review of Systems:   Constitutional: Denied constitutional symptoms, night sweats, recent illness, fatigue, fever, insomnia and weight loss.  Eyes: Denied eye symptoms, eye pain, photophobia, vision change and visual disturbance.  Ears/Nose/Throat/Neck: Denied ear, nose, throat or neck symptoms, hearing loss, nasal discharge, sinus congestion and sore throat.  Cardiovascular: Denied cardiovascular symptoms, arrhythmia, chest pain/pressure, edema, exercise intolerance, orthopnea and palpitations.  Respiratory: Denied pulmonary symptoms, asthma, pleuritic pain, productive sputum, cough, dyspnea and wheezing.  Gastrointestinal: Denied, gastro-esophageal reflux, melena, nausea and vomiting.  Genitourinary: Denied genitourinary symptoms including symptomatic vaginal discharge, pelvic relaxation issues, and urinary complaints.  Musculoskeletal: Denied musculoskeletal symptoms, stiffness, swelling, muscle weakness and myalgia.  Dermatologic: Denied dermatology symptoms, rash and scar.  Neurologic: Denied neurology symptoms, dizziness, headache, neck pain and syncope.  Psychiatric: Denied psychiatric symptoms, anxiety and depression.  Endocrine: Denied endocrine symptoms including hot flashes and night sweats.   OB History  Gravida Para Term Preterm AB Living  3 3 3     3   SAB TAB Ectopic Multiple Live Births          3    # Outcome Date GA Lbr Len/2nd Weight Sex Delivery Anes PTL Lv  3 Term 1992   7 lb 4  oz (3.289 kg) F Vag-Spont  N LIV  2 Term 1989   7 lb 12 oz (3.515 kg) M Vag-Spont  N LIV  1 Term 1988   8 lb 4.5 oz (3.756 kg) M Vag-Spont  N LIV    Past Medical History:  Diagnosis Date  . Celiac disease   . GERD (gastroesophageal reflux disease)   . Hypertension   . Migraine with visual aura   . UTI (lower urinary tract infection)     Past Surgical History:  Procedure Laterality Date  . BLADDER SUSPENSION    . EXPLORATORY LAPAROTOMY    . TUBAL LIGATION        SOCIAL HISTORY:  Social History   Tobacco Use  Smoking Status Never Smoker  Smokeless Tobacco Never Used   Social History   Substance and Sexual Activity  Alcohol Use No  . Alcohol/week: 0.0 oz    Social History   Substance and Sexual Activity  Drug Use No    Family History  Problem Relation Age of Onset  . Diabetes Son 12       type 1    ALLERGIES:  Gluten meal and Ondansetron  MEDS:   Current Outpatient Medications on File Prior to Visit  Medication Sig Dispense Refill  . ibuprofen (ADVIL,MOTRIN) 600 MG tablet Take 1 tablet (600 mg total) by mouth every 6 (six) hours as needed. 30 tablet 0  . Naproxen Sod-diphenhydrAMINE (ALEVE PM) 220-25 MG TABS Take by mouth.    . norethindrone (AYGESTIN) 5 MG tablet Take 5 mg by mouth daily.    Marland Kitchen omeprazole (PRILOSEC) 20 MG capsule Take 1 capsule (20 mg total) by mouth daily. 30 capsule 5  . norethindrone (AYGESTIN) 5 MG tablet Take 1 tablet (5 mg total) by  mouth 3 (three) times daily for 21 days. As directed 3 times daily for 6 days then twice daily for 14 days. 42 tablet 0   No current facility-administered medications on file prior to visit.     Meds ordered this encounter  Medications  . metroNIDAZOLE (FLAGYL) 500 MG tablet    Sig: Take 1 tablet (500 mg total) by mouth 2 (two) times daily. Begin 5 days prior to scheduled surgery as directed.    Dispense:  10 tablet    Refill:  0     Physical examination BP (!) 141/76   Pulse 77   Ht 5' (1.524  m)   Wt 156 lb 6 oz (70.9 kg)   LMP  (LMP Unknown)   BMI 30.54 kg/m   General NAD, Conversant  HEENT Atraumatic; Op clear with mmm.  Normo-cephalic. Pupils reactive. Anicteric sclerae  Thyroid/Neck Smooth without nodularity or enlargement. Normal ROM.  Neck Supple.  Skin No rashes, lesions or ulceration. Normal palpated skin turgor. No nodularity.  Breasts: No masses or discharge.  Symmetric.  No axillary adenopathy.  Lungs: Clear to auscultation.No rales or wheezes. Normal Respiratory effort, no retractions.  Heart: NSR.  No murmurs or rubs appreciated. No periferal edema  Abdomen: Soft.  Non-tender.  No masses.  No HSM. No hernia  Extremities: Moves all appropriately.  Normal ROM for age. No lymphadenopathy.  Neuro: Oriented to PPT.  Normal mood. Normal affect.     Pelvic:   Vulva: Normal appearance.  No lesions.  Vagina: No lesions or abnormalities noted.  Support: 2nd degree cystocele and rectocelee   Urethra No masses tenderness or scarring.  Meatus Normal size without lesions or prolapse.  Cervix: Normal ectropion.  No lesions.  Anus: Normal exam.  No lesions.  Perineum: Normal exam.  No lesions.        Bimanual   Uterus:  10 weeks size Tender    Mobile.  AV.  Adnexae: No masses.  Non-tender to palpation.  Cul-de-sac: Negative for abnormality.   Assessment:   [id number] Patient Active Problem List   Diagnosis Date Noted  . Blood loss anemia 03/05/2017  . BV (bacterial vaginosis) 11/27/2012  . UTI (lower urinary tract infection) 11/27/2012  . Migraine with visual aura 09/19/2011  . Depression 09/19/2011  . Constipation 06/25/2011    1. Preop examination   2. Menorrhagia with regular cycle   3. Dysmenorrhea   4. Intrauterine contraceptive device threads lost, subsequent encounter   5. Fibroid      Plan:   Orders: Meds ordered this encounter  Medications  . metroNIDAZOLE (FLAGYL) 500 MG tablet    Sig: Take 1 tablet (500 mg total) by mouth 2 (two) times daily.  Begin 5 days prior to scheduled surgery as directed.    Dispense:  10 tablet    Refill:  0     1.  laparoscopic assisted vaginal hysterectomy and bilateral oophorectomy  Pre-op discussions regarding Risks and Benefits of her scheduled surgery.  LAVH The procedure of Laparoscopic Assisted Vaginal Hysterectomy was described to the patient in detail.  We reviewed the rationale for Hysterectomy and the patient was again informed of other nonsurgical management possibilities for her condition.  She has considered these other options, and desires a Hysterectomy.  We have reviewed the fact that Hysterectomy is permanent and that following the procedure she will not be able to become pregnant or bear children.  We have discussed the following risk factors specifically and the patient has also been  informed that additional complications not mentioned may develop:  Damage to bowel, bladder, ureters or to other internal organs, bleeding, infection and the risk from anesthesia.  We have discussed the procedure itself in detail and she has an informed understanding of this surgery.  We have also discussed the recovery period in which physical and sexual activity will be restricted for a varying degree of time, often 3 - 6 weeks. The Laparoscopic Portion of Hysterectomy has also been reviewed with the patient.  She understands how the laparoscope facilitates the procedure.  We have discussed the abdominal incisions and punctures that will be used.  We have also reviewed the increased Operating Room time often accompanying LAVH.  The slightly increased risk of complications secondary to abdominal punctures, and use of laparoscopic instrumentation has also been discussed in detail.I have answered all of her questions and I believe the patient has an informed understanding of the procedure of Laparoscopic Assisted Vaginal Hysterectomy. Oophorectomy The option of Oophorectomy has been discussed with the patient.   Detailed risk/benefits have been reviewed.  The risks discussed include, but are not limited to, hemorrhage, infection, damage to ureter or other internal organ, and Ovarian Remnant Syndrome.  The benefits include a significant decrease in the risk of Ovarian Cancer and in benign Ovarian disease.  The risk of Ovarian CA has been estimated at 1 in 75.  This is a relatively small risk.  However, should Ovarian CA develop, it is often found late in the course of the disease.  We have also discussed the role of inheritance in the development of Ovarian disease.  Some women, who have close relatives with Ovarian CA, have a higher than 1 in 70 risk of Ovarian CA.  The benefits of Estrogen replacement therapy following Oophorectomy has been stressed.  If she is premenopausal, we have discussed the fact that this procedure will make her permanently sterile and that premature menopause will result if no ERT is begun.  I have answered all of her questions, and I believe that she has an adequate and informed understanding of the risks and benefits of Oophorectomy. Patient strongly desires bilateral oophorectomy.  At the time of surgery we will remove her IUD be it Intrauterine or intra-abdominal.   I have discussed this in detail with her.  Finis Bud, M.D. 07/05/2017 10:26 AM

## 2017-07-05 NOTE — H&P (View-Only) (Signed)
PRE-OPERATIVE HISTORY AND PHYSICAL EXAM  PCP:  Patient, No Pcp Per Subjective:   HPI:  [name] is a 52 y.o. [id number].  No LMP recorded (lmp unknown).  She presents today for a pre-op discussion and PE.  She has the following symptoms: Pelvic cramping, dysfunctional bleeding despite IUD and Aygestin.  Generally uncomfortable with intermittent pelvic pain. Significant history includes a history of "some type of bladder sling procedure". Previous abdominal surgery-tubal ligation.  Review of Systems:   Constitutional: Denied constitutional symptoms, night sweats, recent illness, fatigue, fever, insomnia and weight loss.  Eyes: Denied eye symptoms, eye pain, photophobia, vision change and visual disturbance.  Ears/Nose/Throat/Neck: Denied ear, nose, throat or neck symptoms, hearing loss, nasal discharge, sinus congestion and sore throat.  Cardiovascular: Denied cardiovascular symptoms, arrhythmia, chest pain/pressure, edema, exercise intolerance, orthopnea and palpitations.  Respiratory: Denied pulmonary symptoms, asthma, pleuritic pain, productive sputum, cough, dyspnea and wheezing.  Gastrointestinal: Denied, gastro-esophageal reflux, melena, nausea and vomiting.  Genitourinary: Denied genitourinary symptoms including symptomatic vaginal discharge, pelvic relaxation issues, and urinary complaints.  Musculoskeletal: Denied musculoskeletal symptoms, stiffness, swelling, muscle weakness and myalgia.  Dermatologic: Denied dermatology symptoms, rash and scar.  Neurologic: Denied neurology symptoms, dizziness, headache, neck pain and syncope.  Psychiatric: Denied psychiatric symptoms, anxiety and depression.  Endocrine: Denied endocrine symptoms including hot flashes and night sweats.   OB History  Gravida Para Term Preterm AB Living  3 3 3     3   SAB TAB Ectopic Multiple Live Births          3    # Outcome Date GA Lbr Len/2nd Weight Sex Delivery Anes PTL Lv  3 Term 1992   7 lb 4  oz (3.289 kg) F Vag-Spont  N LIV  2 Term 1989   7 lb 12 oz (3.515 kg) M Vag-Spont  N LIV  1 Term 1988   8 lb 4.5 oz (3.756 kg) M Vag-Spont  N LIV    Past Medical History:  Diagnosis Date  . Celiac disease   . GERD (gastroesophageal reflux disease)   . Hypertension   . Migraine with visual aura   . UTI (lower urinary tract infection)     Past Surgical History:  Procedure Laterality Date  . BLADDER SUSPENSION    . EXPLORATORY LAPAROTOMY    . TUBAL LIGATION        SOCIAL HISTORY:  Social History   Tobacco Use  Smoking Status Never Smoker  Smokeless Tobacco Never Used   Social History   Substance and Sexual Activity  Alcohol Use No  . Alcohol/week: 0.0 oz    Social History   Substance and Sexual Activity  Drug Use No    Family History  Problem Relation Age of Onset  . Diabetes Son 12       type 1    ALLERGIES:  Gluten meal and Ondansetron  MEDS:   Current Outpatient Medications on File Prior to Visit  Medication Sig Dispense Refill  . ibuprofen (ADVIL,MOTRIN) 600 MG tablet Take 1 tablet (600 mg total) by mouth every 6 (six) hours as needed. 30 tablet 0  . Naproxen Sod-diphenhydrAMINE (ALEVE PM) 220-25 MG TABS Take by mouth.    . norethindrone (AYGESTIN) 5 MG tablet Take 5 mg by mouth daily.    Marland Kitchen omeprazole (PRILOSEC) 20 MG capsule Take 1 capsule (20 mg total) by mouth daily. 30 capsule 5  . norethindrone (AYGESTIN) 5 MG tablet Take 1 tablet (5 mg total) by  mouth 3 (three) times daily for 21 days. As directed 3 times daily for 6 days then twice daily for 14 days. 42 tablet 0   No current facility-administered medications on file prior to visit.     Meds ordered this encounter  Medications  . metroNIDAZOLE (FLAGYL) 500 MG tablet    Sig: Take 1 tablet (500 mg total) by mouth 2 (two) times daily. Begin 5 days prior to scheduled surgery as directed.    Dispense:  10 tablet    Refill:  0     Physical examination BP (!) 141/76   Pulse 77   Ht 5' (1.524  m)   Wt 156 lb 6 oz (70.9 kg)   LMP  (LMP Unknown)   BMI 30.54 kg/m   General NAD, Conversant  HEENT Atraumatic; Op clear with mmm.  Normo-cephalic. Pupils reactive. Anicteric sclerae  Thyroid/Neck Smooth without nodularity or enlargement. Normal ROM.  Neck Supple.  Skin No rashes, lesions or ulceration. Normal palpated skin turgor. No nodularity.  Breasts: No masses or discharge.  Symmetric.  No axillary adenopathy.  Lungs: Clear to auscultation.No rales or wheezes. Normal Respiratory effort, no retractions.  Heart: NSR.  No murmurs or rubs appreciated. No periferal edema  Abdomen: Soft.  Non-tender.  No masses.  No HSM. No hernia  Extremities: Moves all appropriately.  Normal ROM for age. No lymphadenopathy.  Neuro: Oriented to PPT.  Normal mood. Normal affect.     Pelvic:   Vulva: Normal appearance.  No lesions.  Vagina: No lesions or abnormalities noted.  Support: 2nd degree cystocele and rectocelee   Urethra No masses tenderness or scarring.  Meatus Normal size without lesions or prolapse.  Cervix: Normal ectropion.  No lesions.  Anus: Normal exam.  No lesions.  Perineum: Normal exam.  No lesions.        Bimanual   Uterus:  10 weeks size Tender    Mobile.  AV.  Adnexae: No masses.  Non-tender to palpation.  Cul-de-sac: Negative for abnormality.   Assessment:   [id number] Patient Active Problem List   Diagnosis Date Noted  . Blood loss anemia 03/05/2017  . BV (bacterial vaginosis) 11/27/2012  . UTI (lower urinary tract infection) 11/27/2012  . Migraine with visual aura 09/19/2011  . Depression 09/19/2011  . Constipation 06/25/2011    1. Preop examination   2. Menorrhagia with regular cycle   3. Dysmenorrhea   4. Intrauterine contraceptive device threads lost, subsequent encounter   5. Fibroid      Plan:   Orders: Meds ordered this encounter  Medications  . metroNIDAZOLE (FLAGYL) 500 MG tablet    Sig: Take 1 tablet (500 mg total) by mouth 2 (two) times daily.  Begin 5 days prior to scheduled surgery as directed.    Dispense:  10 tablet    Refill:  0     1.  laparoscopic assisted vaginal hysterectomy and bilateral oophorectomy  Pre-op discussions regarding Risks and Benefits of her scheduled surgery.  LAVH The procedure of Laparoscopic Assisted Vaginal Hysterectomy was described to the patient in detail.  We reviewed the rationale for Hysterectomy and the patient was again informed of other nonsurgical management possibilities for her condition.  She has considered these other options, and desires a Hysterectomy.  We have reviewed the fact that Hysterectomy is permanent and that following the procedure she will not be able to become pregnant or bear children.  We have discussed the following risk factors specifically and the patient has also been  informed that additional complications not mentioned may develop:  Damage to bowel, bladder, ureters or to other internal organs, bleeding, infection and the risk from anesthesia.  We have discussed the procedure itself in detail and she has an informed understanding of this surgery.  We have also discussed the recovery period in which physical and sexual activity will be restricted for a varying degree of time, often 3 - 6 weeks. The Laparoscopic Portion of Hysterectomy has also been reviewed with the patient.  She understands how the laparoscope facilitates the procedure.  We have discussed the abdominal incisions and punctures that will be used.  We have also reviewed the increased Operating Room time often accompanying LAVH.  The slightly increased risk of complications secondary to abdominal punctures, and use of laparoscopic instrumentation has also been discussed in detail.I have answered all of her questions and I believe the patient has an informed understanding of the procedure of Laparoscopic Assisted Vaginal Hysterectomy. Oophorectomy The option of Oophorectomy has been discussed with the patient.   Detailed risk/benefits have been reviewed.  The risks discussed include, but are not limited to, hemorrhage, infection, damage to ureter or other internal organ, and Ovarian Remnant Syndrome.  The benefits include a significant decrease in the risk of Ovarian Cancer and in benign Ovarian disease.  The risk of Ovarian CA has been estimated at 1 in 31.  This is a relatively small risk.  However, should Ovarian CA develop, it is often found late in the course of the disease.  We have also discussed the role of inheritance in the development of Ovarian disease.  Some women, who have close relatives with Ovarian CA, have a higher than 1 in 70 risk of Ovarian CA.  The benefits of Estrogen replacement therapy following Oophorectomy has been stressed.  If she is premenopausal, we have discussed the fact that this procedure will make her permanently sterile and that premature menopause will result if no ERT is begun.  I have answered all of her questions, and I believe that she has an adequate and informed understanding of the risks and benefits of Oophorectomy. Patient strongly desires bilateral oophorectomy.  At the time of surgery we will remove her IUD be it Intrauterine or intra-abdominal.   I have discussed this in detail with her.  Finis Bud, M.D. 07/05/2017 10:26 AM

## 2017-07-21 ENCOUNTER — Other Ambulatory Visit: Payer: Self-pay

## 2017-07-21 ENCOUNTER — Encounter
Admission: RE | Admit: 2017-07-21 | Discharge: 2017-07-21 | Disposition: A | Discharge status: Home / Self Care | Payer: PRIVATE HEALTH INSURANCE | Source: Ambulatory Visit | Attending: Obstetrics and Gynecology | Admitting: Obstetrics and Gynecology

## 2017-07-21 HISTORY — DX: Family history of other specified conditions: Z84.89

## 2017-07-21 HISTORY — DX: Anemia, unspecified: D64.9

## 2017-07-21 HISTORY — DX: Elevated blood-pressure reading, without diagnosis of hypertension: R03.0

## 2017-07-21 NOTE — Patient Instructions (Signed)
Your procedure is scheduled on: 07-25-17 MONDAY Report to Same Day Surgery 2nd floor medical mall [facility] Entrance-take elevator on left to 2nd floor.  Check in with surgery information desk.) To find out your arrival time please call [phone] between 1PM - 3PM on 07-22-17 FRIDAY  Remember: Instructions that are not followed completely may result in serious medical risk, up to and including death, or upon the discretion of your surgeon and anesthesiologist your surgery may need to be rescheduled.    _x___ 1. Do not eat food after midnight the night before your procedure. NO GUM OR CANDY AFTER MIDNIGHT.  You may drink clear liquids up to 2 hours before you are scheduled to arrive at the hospital for your procedure.  Do not drink clear liquids within 2 hours of your scheduled arrival to the hospital.  Clear liquids include  --Water or Apple juice without pulp  --Clear carbohydrate beverage such as ClearFast or Gatorade  --Black Coffee or Clear Tea (No milk, no creamers, do not add anything to  the coffee or Tea     __x__ 2. No Alcohol for 24 hours before or after surgery.   __x__3. No Smoking or e-cigarettes for 24 prior to surgery.  Do not use any chewable tobacco products for at least 6 hour prior to surgery   ____  4. Bring all medications with you on the day of surgery if instructed.    __x__ 5. Notify your doctor if there is any change in your medical condition     (cold, fever, infections).    x___6. On the morning of surgery brush your teeth with toothpaste and water.  You may rinse your mouth with mouth wash if you wish.  Do not swallow any toothpaste or mouthwash.   Do not wear jewelry, make-up, hairpins, clips or nail polish.  Do not wear lotions, powders, or perfumes. You may wear deodorant.  Do not shave 48 hours prior to surgery. Men may shave face and neck.  Do not bring valuables to the hospital.    [facility] is not responsible for any belongings or  valuables.               Contacts, dentures or bridgework may not be worn into surgery.  Leave your suitcase in the car. After surgery it may be brought to your room.  For patients admitted to the hospital, discharge time is determined by your treatment team.  _  Patients discharged the day of surgery will not be allowed to drive home.  You will need someone to drive you home and stay with you the night of your procedure.    Please read over the following fact sheets that you were given:   [facility] Preparing for Surgery and or MRSA Information   _x___ TAKE THE FOLLOWING MEDICATION THE MORNING OF SURGERY. These include:  1. PRILOSEC (OMEPRAZOLE)  2. TAKE A PRILOSEC THE NIGHT BEFORE YOUR SURGERY  3.  4.  5.  6.  ____Fleets enema or Magnesium Citrate as directed.   _x___ Use CHG Soap or sage wipes as directed on instruction sheet   ____ Use inhalers on the day of surgery and bring to hospital day of surgery  ____ Stop Metformin and Janumet 2 days prior to surgery.    ____ Take 1/2 of usual insulin dose the night before surgery and none on the morning surgery.   ____ Follow recommendations from Cardiologist, Pulmonologist or PCP regarding stopping Aspirin, Coumadin, Plavix ,Eliquis,  Effient, or Pradaxa, and Pletal.  X____Stop Anti-inflammatories such as Advil, ALEVE, IBUPROFEN, Motrin, Naproxen, Naprosyn, Goodies powders or aspirin products NOW-OK to take Tylenol    ____ Stop supplements until after surgery.    ____ Bring C-Pap to the hospital.

## 2017-07-22 ENCOUNTER — Encounter
Admission: RE | Admit: 2017-07-22 | Discharge: 2017-07-22 | Disposition: A | Discharge status: Home / Self Care | Payer: PRIVATE HEALTH INSURANCE | Source: Ambulatory Visit | Attending: Obstetrics and Gynecology | Admitting: Obstetrics and Gynecology

## 2017-07-22 DIAGNOSIS — N938 Other specified abnormal uterine and vaginal bleeding: Secondary | ICD-10-CM | POA: Insufficient documentation

## 2017-07-22 DIAGNOSIS — Z0183 Encounter for blood typing: Secondary | ICD-10-CM | POA: Diagnosis not present

## 2017-07-22 DIAGNOSIS — R102 Pelvic and perineal pain: Secondary | ICD-10-CM | POA: Insufficient documentation

## 2017-07-22 DIAGNOSIS — Z01812 Encounter for preprocedural laboratory examination: Secondary | ICD-10-CM | POA: Insufficient documentation

## 2017-07-22 DIAGNOSIS — D259 Leiomyoma of uterus, unspecified: Secondary | ICD-10-CM | POA: Diagnosis not present

## 2017-07-22 LAB — CBC
HCT: 39.5 % (ref 35.0–47.0)
Hemoglobin: 12.9 g/dL (ref 12.0–16.0)
MCH: 27.5 pg (ref 26.0–34.0)
MCHC: 32.7 g/dL (ref 32.0–36.0)
MCV: 84.3 fL (ref 80.0–100.0)
Platelets: 306 10*3/uL (ref 150–440)
RBC: 4.68 MIL/uL (ref 3.80–5.20)
RDW: 25.6 % — ABNORMAL HIGH (ref 11.5–14.5)
WBC: 3.7 10*3/uL (ref 3.6–11.0)

## 2017-07-22 LAB — TYPE AND SCREEN
ABO/RH(D): O NEG
Antibody Screen: NEGATIVE

## 2017-07-22 LAB — HCG, QUANTITATIVE, PREGNANCY: hCG, Beta Chain, Quant, S: <1 m[IU]/mL (ref <5)

## 2017-07-24 MED ORDER — CEFAZOLIN SODIUM-DEXTROSE 2-4 GM/100ML-% IV SOLN
2.0000 g | INTRAVENOUS | Status: AC
  Administered 2017-07-25: 2 g via INTRAVENOUS
  Filled 2017-07-24: qty 100

## 2017-07-25 ENCOUNTER — Encounter
Admission: RE | Disposition: A | Discharge status: Home / Self Care | Payer: Self-pay | Source: Ambulatory Visit | Attending: Obstetrics and Gynecology

## 2017-07-25 ENCOUNTER — Ambulatory Visit: Payer: PRIVATE HEALTH INSURANCE | Admitting: Certified Registered"

## 2017-07-25 ENCOUNTER — Encounter: Payer: Self-pay | Admitting: Certified Registered"

## 2017-07-25 ENCOUNTER — Observation Stay
Admission: RE | Admit: 2017-07-25 | Discharge: 2017-07-26 | Disposition: A | Discharge status: Home / Self Care | Payer: PRIVATE HEALTH INSURANCE | Source: Ambulatory Visit | Attending: Obstetrics and Gynecology | Admitting: Obstetrics and Gynecology

## 2017-07-25 ENCOUNTER — Other Ambulatory Visit: Payer: Self-pay

## 2017-07-25 DIAGNOSIS — Z79890 Hormone replacement therapy (postmenopausal): Secondary | ICD-10-CM | POA: Insufficient documentation

## 2017-07-25 DIAGNOSIS — N800 Endometriosis of uterus: Secondary | ICD-10-CM | POA: Diagnosis not present

## 2017-07-25 DIAGNOSIS — N838 Other noninflammatory disorders of ovary, fallopian tube and broad ligament: Secondary | ICD-10-CM | POA: Diagnosis not present

## 2017-07-25 DIAGNOSIS — D252 Subserosal leiomyoma of uterus: Secondary | ICD-10-CM | POA: Diagnosis not present

## 2017-07-25 DIAGNOSIS — I10 Essential (primary) hypertension: Secondary | ICD-10-CM | POA: Diagnosis not present

## 2017-07-25 DIAGNOSIS — Z791 Long term (current) use of non-steroidal anti-inflammatories (NSAID): Secondary | ICD-10-CM | POA: Insufficient documentation

## 2017-07-25 DIAGNOSIS — N920 Excessive and frequent menstruation with regular cycle: Secondary | ICD-10-CM | POA: Diagnosis not present

## 2017-07-25 DIAGNOSIS — N946 Dysmenorrhea, unspecified: Secondary | ICD-10-CM | POA: Diagnosis not present

## 2017-07-25 DIAGNOSIS — N72 Inflammatory disease of cervix uteri: Secondary | ICD-10-CM | POA: Insufficient documentation

## 2017-07-25 DIAGNOSIS — Z30432 Encounter for removal of intrauterine contraceptive device: Secondary | ICD-10-CM | POA: Insufficient documentation

## 2017-07-25 DIAGNOSIS — N8301 Follicular cyst of right ovary: Secondary | ICD-10-CM | POA: Diagnosis not present

## 2017-07-25 DIAGNOSIS — K219 Gastro-esophageal reflux disease without esophagitis: Secondary | ICD-10-CM | POA: Insufficient documentation

## 2017-07-25 DIAGNOSIS — Z79899 Other long term (current) drug therapy: Secondary | ICD-10-CM | POA: Insufficient documentation

## 2017-07-25 DIAGNOSIS — Z9889 Other specified postprocedural states: Secondary | ICD-10-CM

## 2017-07-25 HISTORY — PX: IUD REMOVAL: SHX5392

## 2017-07-25 HISTORY — PX: LAPAROSCOPIC ASSISTED VAGINAL HYSTERECTOMY: SHX5398

## 2017-07-25 LAB — POCT PREGNANCY, URINE: Preg Test, Ur: NEGATIVE

## 2017-07-25 SURGERY — HYSTERECTOMY, VAGINAL, LAPAROSCOPY-ASSISTED
Anesthesia: General | Laterality: Bilateral | Wound class: Clean Contaminated

## 2017-07-25 MED ORDER — BUPIVACAINE HCL (PF) 0.5 % IJ SOLN
INTRAMUSCULAR | Status: AC
  Filled 2017-07-25: qty 30

## 2017-07-25 MED ORDER — PROPOFOL 10 MG/ML IV BOLUS
INTRAVENOUS | Status: DC | PRN
  Administered 2017-07-25: 50 mg via INTRAVENOUS
  Administered 2017-07-25: 150 mg via INTRAVENOUS
  Administered 2017-07-25: 50 mg via INTRAVENOUS

## 2017-07-25 MED ORDER — MIDAZOLAM HCL 2 MG/2ML IJ SOLN
INTRAMUSCULAR | Status: DC | PRN
  Administered 2017-07-25 (×2): 2 mg via INTRAVENOUS

## 2017-07-25 MED ORDER — CEFAZOLIN SODIUM-DEXTROSE 2-3 GM-%(50ML) IV SOLR
INTRAVENOUS | Status: AC
  Filled 2017-07-25: qty 50

## 2017-07-25 MED ORDER — KETOROLAC TROMETHAMINE 30 MG/ML IJ SOLN: 30.0000 mg | Freq: Four times a day (QID) | INTRAMUSCULAR | Status: DC

## 2017-07-25 MED ORDER — IBUPROFEN 600 MG PO TABS
600.0000 mg | ORAL_TABLET | Freq: Four times a day (QID) | ORAL | Status: DC | PRN
  Administered 2017-07-26 (×2): 600 mg via ORAL
  Filled 2017-07-25 (×2): qty 1

## 2017-07-25 MED ORDER — ACETAMINOPHEN 10 MG/ML IV SOLN
INTRAVENOUS | Status: DC | PRN
  Administered 2017-07-25: 1000 mg via INTRAVENOUS

## 2017-07-25 MED ORDER — ACETAMINOPHEN NICU IV SYRINGE 10 MG/ML
INTRAVENOUS | Status: AC
  Filled 2017-07-25: qty 1

## 2017-07-25 MED ORDER — FENTANYL CITRATE (PF) 100 MCG/2ML IJ SOLN
INTRAMUSCULAR | Status: AC
  Filled 2017-07-25: qty 2

## 2017-07-25 MED ORDER — FENTANYL CITRATE (PF) 100 MCG/2ML IJ SOLN
INTRAMUSCULAR | Status: DC | PRN
  Administered 2017-07-25: 100 ug via INTRAVENOUS

## 2017-07-25 MED ORDER — DEXAMETHASONE SODIUM PHOSPHATE 10 MG/ML IJ SOLN
INTRAMUSCULAR | Status: DC | PRN
  Administered 2017-07-25: 10 mg via INTRAVENOUS

## 2017-07-25 MED ORDER — PROMETHAZINE HCL 25 MG PO TABS
25.0000 mg | ORAL_TABLET | Freq: Four times a day (QID) | ORAL | Status: DC | PRN
Start: 2017-07-25 — End: 2017-07-26
  Administered 2017-07-25: 25 mg via ORAL
  Filled 2017-07-25 (×2): qty 1

## 2017-07-25 MED ORDER — PHENYLEPHRINE HCL 10 MG/ML IJ SOLN
INTRAMUSCULAR | Status: DC | PRN
  Administered 2017-07-25 (×2): 100 ug via INTRAVENOUS

## 2017-07-25 MED ORDER — GLYCOPYRROLATE 0.2 MG/ML IJ SOLN
INTRAMUSCULAR | Status: DC | PRN
  Administered 2017-07-25: 0.2 mg via INTRAVENOUS
  Administered 2017-07-25: 0.1 mg via INTRAVENOUS

## 2017-07-25 MED ORDER — SUGAMMADEX SODIUM 200 MG/2ML IV SOLN
INTRAVENOUS | Status: DC | PRN
  Administered 2017-07-25: 200 mg via INTRAVENOUS

## 2017-07-25 MED ORDER — DEXTROSE IN LACTATED RINGERS 5 % IV SOLN
INTRAVENOUS | Status: DC
  Administered 2017-07-25 – 2017-07-26 (×3): via INTRAVENOUS

## 2017-07-25 MED ORDER — ROCURONIUM BROMIDE 50 MG/5ML IV SOLN
INTRAVENOUS | Status: AC
  Filled 2017-07-25: qty 1

## 2017-07-25 MED ORDER — FENTANYL CITRATE (PF) 100 MCG/2ML IJ SOLN
INTRAMUSCULAR | Status: AC
  Administered 2017-07-25: 50 ug via INTRAVENOUS
  Filled 2017-07-25: qty 2

## 2017-07-25 MED ORDER — HYDROMORPHONE HCL 1 MG/ML IJ SOLN
0.2000 mg | INTRAMUSCULAR | Status: DC | PRN
  Administered 2017-07-25 – 2017-07-26 (×4): 0.4 mg via INTRAVENOUS
  Filled 2017-07-25 (×4): qty 1

## 2017-07-25 MED ORDER — ONDANSETRON HCL 4 MG/2ML IJ SOLN
INTRAMUSCULAR | Status: DC | PRN
  Administered 2017-07-25: 4 mg via INTRAVENOUS

## 2017-07-25 MED ORDER — SIMETHICONE 80 MG PO CHEW: 80.0000 mg | CHEWABLE_TABLET | Freq: Four times a day (QID) | ORAL | Status: DC | PRN

## 2017-07-25 MED ORDER — SUGAMMADEX SODIUM 200 MG/2ML IV SOLN
INTRAVENOUS | Status: AC
Start: 2017-07-25 — End: ?
  Filled 2017-07-25: qty 2

## 2017-07-25 MED ORDER — KETOROLAC TROMETHAMINE 30 MG/ML IJ SOLN
INTRAMUSCULAR | Status: AC
  Filled 2017-07-25: qty 1

## 2017-07-25 MED ORDER — HYDROMORPHONE HCL 1 MG/ML IJ SOLN
INTRAMUSCULAR | Status: AC
Start: 2017-07-25 — End: ?
  Filled 2017-07-25: qty 2

## 2017-07-25 MED ORDER — LIDOCAINE HCL (CARDIAC) 20 MG/ML IV SOLN
INTRAVENOUS | Status: DC | PRN
  Administered 2017-07-25: 50 mg via INTRAVENOUS

## 2017-07-25 MED ORDER — SODIUM CHLORIDE 0.9 % IJ SOLN
INTRAMUSCULAR | Status: AC
  Filled 2017-07-25: qty 10

## 2017-07-25 MED ORDER — DEXAMETHASONE SODIUM PHOSPHATE 10 MG/ML IJ SOLN
INTRAMUSCULAR | Status: AC
  Filled 2017-07-25: qty 1

## 2017-07-25 MED ORDER — PROPOFOL 10 MG/ML IV BOLUS
INTRAVENOUS | Status: AC
  Filled 2017-07-25: qty 20

## 2017-07-25 MED ORDER — KETOROLAC TROMETHAMINE 30 MG/ML IJ SOLN: 30.0000 mg | Freq: Once | INTRAMUSCULAR | Status: DC

## 2017-07-25 MED ORDER — OXYCODONE HCL 5 MG PO TABS
ORAL_TABLET | ORAL | Status: AC
  Filled 2017-07-25: qty 1

## 2017-07-25 MED ORDER — VASOPRESSIN 20 UNIT/ML IV SOLN
INTRAVENOUS | Status: AC
  Filled 2017-07-25: qty 1

## 2017-07-25 MED ORDER — ONDANSETRON HCL 4 MG/2ML IJ SOLN
INTRAMUSCULAR | Status: AC
  Filled 2017-07-25: qty 2

## 2017-07-25 MED ORDER — LACTATED RINGERS IV SOLN: INTRAVENOUS | Status: DC

## 2017-07-25 MED ORDER — OXYCODONE HCL 5 MG PO TABS
5.0000 mg | ORAL_TABLET | Freq: Once | ORAL | Status: AC | PRN
  Administered 2017-07-25: 5 mg via ORAL

## 2017-07-25 MED ORDER — LACTATED RINGERS IV SOLN
INTRAVENOUS | Status: DC
  Administered 2017-07-25 (×2): via INTRAVENOUS

## 2017-07-25 MED ORDER — LIDOCAINE HCL (PF) 2 % IJ SOLN
INTRAMUSCULAR | Status: AC
  Filled 2017-07-25: qty 10

## 2017-07-25 MED ORDER — MIDAZOLAM HCL 2 MG/2ML IJ SOLN
INTRAMUSCULAR | Status: AC
  Filled 2017-07-25: qty 4

## 2017-07-25 MED ORDER — GLYCOPYRROLATE 0.2 MG/ML IJ SOLN
INTRAMUSCULAR | Status: AC
  Filled 2017-07-25: qty 1

## 2017-07-25 MED ORDER — SODIUM CHLORIDE 0.9 % IJ SOLN
INTRAMUSCULAR | Status: AC
  Filled 2017-07-25: qty 50

## 2017-07-25 MED ORDER — KETOROLAC TROMETHAMINE 30 MG/ML IJ SOLN
30.0000 mg | Freq: Four times a day (QID) | INTRAMUSCULAR | Status: DC
  Administered 2017-07-25 – 2017-07-26 (×3): 30 mg via INTRAVENOUS
  Filled 2017-07-25 (×3): qty 1

## 2017-07-25 MED ORDER — FENTANYL CITRATE (PF) 100 MCG/2ML IJ SOLN
25.0000 ug | INTRAMUSCULAR | Status: DC | PRN
  Administered 2017-07-25: 50 ug via INTRAVENOUS
  Administered 2017-07-25: 25 ug via INTRAVENOUS
  Administered 2017-07-25 (×2): 50 ug via INTRAVENOUS
  Administered 2017-07-25: 25 ug via INTRAVENOUS

## 2017-07-25 MED ORDER — HYDROMORPHONE HCL 1 MG/ML IJ SOLN
INTRAMUSCULAR | Status: DC | PRN
  Administered 2017-07-25: .2 mg via INTRAVENOUS
  Administered 2017-07-25: .4 mg via INTRAVENOUS
  Administered 2017-07-25: 1 mg via INTRAVENOUS
  Administered 2017-07-25: .4 mg via INTRAVENOUS

## 2017-07-25 MED ORDER — BUPIVACAINE HCL (PF) 0.5 % IJ SOLN
INTRAMUSCULAR | Status: DC | PRN
  Administered 2017-07-25: .3 mL
  Administered 2017-07-25: 8 mL

## 2017-07-25 MED ORDER — KETOROLAC TROMETHAMINE 30 MG/ML IJ SOLN
INTRAMUSCULAR | Status: DC | PRN
  Administered 2017-07-25: 30 mg via INTRAVENOUS

## 2017-07-25 MED ORDER — ROCURONIUM BROMIDE 100 MG/10ML IV SOLN
INTRAVENOUS | Status: DC | PRN
  Administered 2017-07-25: 50 mg via INTRAVENOUS

## 2017-07-25 MED ORDER — OXYCODONE HCL 5 MG/5ML PO SOLN: 5.0000 mg | Freq: Once | ORAL | Status: AC | PRN

## 2017-07-25 MED ORDER — KETAMINE HCL 10 MG/ML IJ SOLN
INTRAMUSCULAR | Status: DC | PRN
  Administered 2017-07-25: 35 mg via INTRAVENOUS

## 2017-07-25 MED ORDER — ESTRADIOL 1 MG PO TABS
1.0000 mg | ORAL_TABLET | Freq: Every day | ORAL | Status: DC
  Administered 2017-07-26: 1 mg via ORAL
  Filled 2017-07-25: qty 1

## 2017-07-25 SURGICAL SUPPLY — 47 items
ADHESIVE MASTISOL STRL (MISCELLANEOUS) ×3 IMPLANT
BAG URINE DRAINAGE (UROLOGICAL SUPPLIES) ×3 IMPLANT
BLADE SURG SZ11 CARB STEEL (BLADE) ×3 IMPLANT
CATH FOLEY 2WAY  5CC 16FR (CATHETERS) ×1
CATH URTH 16FR FL 2W BLN LF (CATHETERS) ×2 IMPLANT
CHLORAPREP W/TINT 26ML (MISCELLANEOUS) ×3 IMPLANT
DEFOGGER SCOPE WARMER CLEARIFY (MISCELLANEOUS) ×3 IMPLANT
DERMABOND ADVANCED (GAUZE/BANDAGES/DRESSINGS) ×1
DERMABOND ADVANCED .7 DNX12 (GAUZE/BANDAGES/DRESSINGS) ×2 IMPLANT
ELECT REM PT RETURN 9FT ADLT (ELECTROSURGICAL) ×3
ELECTRODE REM PT RTRN 9FT ADLT (ELECTROSURGICAL) ×2 IMPLANT
GLOVE BIO SURGEON STRL SZ 6.5 (GLOVE) ×3 IMPLANT
GLOVE BIO SURGEON STRL SZ8 (GLOVE) ×12 IMPLANT
GLOVE BIOGEL PI IND STRL 7.0 (GLOVE) ×2 IMPLANT
GLOVE BIOGEL PI IND STRL 8 (GLOVE) ×2 IMPLANT
GLOVE BIOGEL PI INDICATOR 7.0 (GLOVE) ×1
GLOVE BIOGEL PI INDICATOR 8 (GLOVE) ×1
GLOVE BIOGEL PI ORTHO PRO 7.5 (GLOVE) ×1
GLOVE PI ORTHO PRO STRL 7.5 (GLOVE) ×2 IMPLANT
GLOVE SURG SYN 8.0 (GLOVE) IMPLANT
GOWN STRL REUS W/ TWL LRG LVL3 (GOWN DISPOSABLE) ×2 IMPLANT
GOWN STRL REUS W/ TWL XL LVL3 (GOWN DISPOSABLE) ×4 IMPLANT
GOWN STRL REUS W/TWL LRG LVL3 (GOWN DISPOSABLE) ×1
GOWN STRL REUS W/TWL XL LVL3 (GOWN DISPOSABLE) ×2
HANDLE YANKAUER SUCT BULB TIP (MISCELLANEOUS) ×3 IMPLANT
IRRIGATION STRYKERFLOW (MISCELLANEOUS) IMPLANT
IRRIGATOR STRYKERFLOW (MISCELLANEOUS)
IV LACTATED RINGERS 1000ML (IV SOLUTION) ×3 IMPLANT
KIT PINK PAD W/HEAD ARE REST (MISCELLANEOUS) ×3
KIT PINK PAD W/HEAD ARM REST (MISCELLANEOUS) ×2 IMPLANT
KIT TURNOVER CYSTO (KITS) ×3 IMPLANT
LIGASURE LAP MARYLAND 5MM 37CM (ELECTROSURGICAL) ×3 IMPLANT
PACK BASIN MINOR ARMC (MISCELLANEOUS) ×3 IMPLANT
PACK GYN LAPAROSCOPIC (MISCELLANEOUS) ×3 IMPLANT
PAD OB MATERNITY 4.3X12.25 (PERSONAL CARE ITEMS) ×3 IMPLANT
PAD PREP 24X41 OB/GYN DISP (PERSONAL CARE ITEMS) ×3 IMPLANT
SLEEVE ENDOPATH XCEL 5M (ENDOMECHANICALS) ×6 IMPLANT
SOLUTION ELECTROLUBE (MISCELLANEOUS) IMPLANT
SPONGE LAP 18X18 5 PK (GAUZE/BANDAGES/DRESSINGS) ×3 IMPLANT
SUT VIC AB 0 CT1 27 (SUTURE) ×2
SUT VIC AB 0 CT1 27XCR 8 STRN (SUTURE) ×4 IMPLANT
SUT VIC AB 0 CT1 36 (SUTURE) ×6 IMPLANT
SUT VIC AB 4-0 FS2 27 (SUTURE) ×6 IMPLANT
SYR 10ML LL (SYRINGE) ×3 IMPLANT
TAPE TRANSPORE STRL 2 31045 (GAUZE/BANDAGES/DRESSINGS) IMPLANT
TROCAR XCEL NON-BLD 5MMX100MML (ENDOMECHANICALS) ×3 IMPLANT
TUBING INSUF HEATED (TUBING) ×3 IMPLANT

## 2017-07-25 NOTE — Op Note (Signed)
OPERATIVE NOTE 07/25/2017 1:02 PM  PRE-OPERATIVE DIAGNOSIS:  1) MENORRHAGIA, DYSMENORRHEA, FIBROID, DUB  POST-OPERATIVE DIAGNOSIS:  1) Same  OPERATION: Procedure(s) (LRB): LAPAROSCOPIC ASSISTED VAGINAL HYSTERECTOMY WITH BILATERAL SALPING OOPHERECTOMY (Bilateral) INTRAUTERINE DEVICE (IUD) REMOVAL     SURGEON(S): Surgeon(s) and Role:    Harlin Heys, MD - Primary    * Defrancesco, Alanda Slim, MD - Assisting   ANESTHESIA: General  ESTIMATED BLOOD LOSS: 30 mL  OPERATIVE FINDINGS: Enlarged uterus - normal pelvis  SPECIMEN:  ID Type Source Tests Collected by Time Destination  1 : Uterus with cervix, bilateral tubes and ovaries Tissue [facility] benign resection SURGICAL PATHOLOGY Vinnie Level, RN [id number]     COMPLICATIONS: None  DRAINS: Foley to gravity  DISPOSITION: Stable to recovery room  DESCRIPTION OF PROCEDURE:      The patient was prepped and draped in the dorsolithotomy position and placed under general anesthesia. The bladder was emptied. The cervix was grasped with a multi-toothed tenaculum the IUD was removed and noted to be intact.  A uterine manipulator was placed within the cervical os respecting the position and curvature of the uterus. After changing gloves we proceeded abdominally. A small infraumbilical incision was made and a 5 mm trocar port was placed within the abdominopelvic cavity. The opening pressure was less than 7 mmHg.  Approximately 3 and 1/2 L of carbon dioxide gas was instilled within the abdominal pelvic cavity. The laparoscope was placed and the pelvis and abdomen were carefully inspected. In the usual manner, under direct visualization right and left lower quadrant ports of 5 mm size were placed. Both ureters were identified in the pelvis prior to dissection or clamping and cutting of pedicles. A window was made in the broad ligament and the infundibulopelvic ligaments were carefully identified. The ligaments were clamped  divided and doubly suture ligated. Hemostasis of the pedicles was noted. The round ligaments were coagulated and divided and a bladder flap was created. The upper aspect of the broad ligament was clamped coagulated and divided. The uterine arteries were skeletonized, triply coagulated and divided. Careful inspection of all pedicles and the remainder of the pelvis was performed. Hemostasis was noted. The lower quadrant ports were removed, hemostasis of the port sites was noted, and the incisions were closed in subcuticular manner. The laparoscope and trocar sleeve were removed from the infraumbilical incision, hemostasis was noted, and the incision was closed in a subcuticular manner. A long-acting anesthetic was employed in the skin incisions. We then proceeded vaginally. A weighted speculum was placed posteriorly. A multi-toothed tenaculum was used to grasp the cervix and the cervix was injected in a circumferential manner with a dilute Pitressin solution. An incision was made around the cervix and the vaginal mucosa was dissected off of the cervix. The posterior cul-de-sac was identified and entered and the weighted speculum was placed within this. The anterior cul-de-sac was identified and entered and a retractor was placed and used to retract the bladder anteriorly keeping it out of the operative field. The uterosacral ligaments were clamped divided and suture ligated. The cardinal ligaments were clamped divided and suture ligated. The small remaining pedicle was clamped divided and suture ligated bilaterally allowing delivery of the specimen. Angle sutures were placed in the manner. A culdoplasty was performed. The peritoneum was identified anteriorly and then incorporating the left upper pedicle left lower pedicle right lower pedicle right upper pedicle and anterior peritoneum a pursestring suture was placed exteriorizing all pedicles.  Hemostasis of all pedicles was noted at this time. The vaginal mucosa  was then closed with a running suture of Vicryl. The patient went to recovery room in stable condition. Clear urine was noted in the Foley at the conclusion of the procedure.  Finis Bud, M.D. 07/25/2017 1:02 PM

## 2017-07-25 NOTE — Anesthesia Preprocedure Evaluation (Signed)
Anesthesia Evaluation  Patient identified by MRN, date of birth, ID band Patient awake    Reviewed: Allergy & Precautions, H&P , NPO status , Patient's Chart, lab work & pertinent test results  History of Anesthesia Complications (+) Family history of anesthesia reaction and history of anesthetic complications  Airway Mallampati: III  TM Distance: <3 FB Neck ROM: full    Dental  (+) Chipped, Poor Dentition   Pulmonary neg pulmonary ROS, neg shortness of breath,           Cardiovascular Exercise Tolerance: Good (-) angina(-) Past MI and (-) DOE negative cardio ROS       Neuro/Psych  Headaches, PSYCHIATRIC DISORDERS Depression    GI/Hepatic Neg liver ROS, GERD  Medicated and Controlled,  Endo/Other  negative endocrine ROS  Renal/GU      Musculoskeletal   Abdominal   Peds  Hematology negative hematology ROS (+)   Anesthesia Other Findings Past Medical History: No date: Anemia No date: Celiac disease No date: Elevated blood pressure reading No date: Family history of adverse reaction to anesthesia     Comment:  MOM-HARD TIME WAKING UP No date: GERD (gastroesophageal reflux disease) No date: Migraine with visual aura     Comment:  MIGRAINES No date: UTI (lower urinary tract infection)  Past Surgical History: No date: BLADDER SUSPENSION No date: EXPLORATORY LAPAROTOMY No date: TUBAL LIGATION  BMI    Body Mass Index:  30.54 kg/m      Reproductive/Obstetrics negative OB ROS                             Anesthesia Physical Anesthesia Plan  ASA: III  Anesthesia Plan: General ETT   Post-op Pain Management:    Induction: Intravenous  PONV Risk Score and Plan: Dexamethasone and Midazolam  Airway Management Planned: Oral ETT and Video Laryngoscope Planned  Additional Equipment:   Intra-op Plan:   Post-operative Plan: Extubation in OR  Informed Consent: I have reviewed the  patients History and Physical, chart, labs and discussed the procedure including the risks, benefits and alternatives for the proposed anesthesia with the patient or authorized representative who has indicated his/her understanding and acceptance.   Dental Advisory Given  Plan Discussed with: Anesthesiologist, CRNA and Surgeon  Anesthesia Plan Comments: (Patient consented for risks of anesthesia including but not limited to:  - adverse reactions to medications - damage to teeth, lips or other oral mucosa - sore throat or hoarseness - Damage to heart, brain, lungs or loss of life  Patient voiced understanding.)        Anesthesia Quick Evaluation

## 2017-07-25 NOTE — Anesthesia Procedure Notes (Signed)
Procedure Name: Intubation Date/Time: 07/25/2017 10:22 AM Performed by: Nile Riggs, CRNA Pre-anesthesia Checklist: Patient identified, Emergency Drugs available, Suction available, Patient being monitored and Timeout performed Patient Re-evaluated:Patient Re-evaluated prior to induction Oxygen Delivery Method: Circle system utilized Preoxygenation: Pre-oxygenation with 100% oxygen Induction Type: IV induction Ventilation: Mask ventilation without difficulty Laryngoscope Size: Miller and 2 Grade View: Grade II Tube type: Oral Tube size: 7.5 mm Number of attempts: 1 Placement Confirmation: ETT inserted through vocal cords under direct vision,  positive ETCO2,  CO2 detector and breath sounds checked- equal and bilateral Secured at: 21 cm Tube secured with: Tape

## 2017-07-25 NOTE — Anesthesia Post-op Follow-up Note (Signed)
Anesthesia QCDR form completed.        

## 2017-07-25 NOTE — Anesthesia Postprocedure Evaluation (Signed)
Anesthesia Post Note  Patient: [name]  Procedure(s) Performed: LAPAROSCOPIC ASSISTED VAGINAL HYSTERECTOMY WITH BILATERAL [place] OOPHERECTOMY (Bilateral ) INTRAUTERINE DEVICE (IUD) REMOVAL  Patient location during evaluation: PACU Anesthesia Type: General Level of consciousness: awake and alert Pain management: pain level controlled Vital Signs Assessment: post-procedure vital signs reviewed and stable Respiratory status: spontaneous breathing, nonlabored ventilation, respiratory function stable and patient connected to nasal cannula oxygen Cardiovascular status: blood pressure returned to baseline and stable Postop Assessment: no apparent nausea or vomiting Anesthetic complications: no     Last Vitals:  Vitals:   07/25/17 1345 07/25/17 1402  BP: (!) 160/96 124/74  Pulse: 93 61  Resp: 16 16  Temp: 36.5 C   SpO2: 95% 95%    Last Pain:  Vitals:   07/25/17 1400  TempSrc:   PainSc: 3                  Precious Haws Evelise Reine

## 2017-07-25 NOTE — Transfer of Care (Signed)
Immediate Anesthesia Transfer of Care Note  Patient: [name]  Procedure(s) Performed: LAPAROSCOPIC ASSISTED VAGINAL HYSTERECTOMY WITH BILATERAL SALPING OOPHERECTOMY (Bilateral ) INTRAUTERINE DEVICE (IUD) REMOVAL  Patient Location: PACU  Anesthesia Type:General  Level of Consciousness: drowsy and patient cooperative  Airway & Oxygen Therapy: Patient Spontanous Breathing and Patient connected to face mask oxygen  Post-op Assessment: Report given to RN, Post -op Vital signs reviewed and stable and Patient moving all extremities X 4  Post vital signs: Reviewed and stable  Last Vitals:  Vitals Value Taken Time  BP    Temp    Pulse 85 07/25/2017 12:14 PM  Resp 16 07/25/2017 12:14 PM  SpO2 94 % 07/25/2017 12:14 PM  Vitals shown include unvalidated device data.  Last Pain:  Vitals:   07/25/17 0958  TempSrc: Temporal         Complications: No apparent anesthesia complications

## 2017-07-25 NOTE — Interval H&P Note (Signed)
History and Physical Interval Note:  07/25/2017 9:52 AM  [name]  has presented today for surgery, with the diagnosis of MENORRHAGIA, DYSMENORRHEA, FIBROID, DUB  The various methods of treatment have been discussed with the patient and family. After consideration of risks, benefits and other options for treatment, the patient has consented to  Procedure(s): LAPAROSCOPIC ASSISTED VAGINAL HYSTERECTOMY WITH BILATERAL SALPING OOPHERECTOMY (Bilateral) as a surgical intervention .  The patient's history has been reviewed, patient examined, no change in status, stable for surgery.  I have reviewed the patient's chart and labs.  Questions were answered to the patient's satisfaction.     Jeannie Fend

## 2017-07-26 ENCOUNTER — Encounter: Payer: Self-pay | Admitting: Obstetrics and Gynecology

## 2017-07-26 DIAGNOSIS — N920 Excessive and frequent menstruation with regular cycle: Secondary | ICD-10-CM | POA: Diagnosis not present

## 2017-07-26 MED ORDER — ESTRADIOL 1 MG PO TABS: 1.0000 mg | ORAL_TABLET | Freq: Every day | ORAL | 1 refills | Status: DC

## 2017-07-26 MED ORDER — OXYCODONE-ACETAMINOPHEN 5-325 MG PO TABS
1.0000 | ORAL_TABLET | ORAL | Status: DC | PRN
Start: 2017-07-26 — End: 2017-07-26
  Administered 2017-07-26: 1 via ORAL
  Filled 2017-07-26: qty 1

## 2017-07-26 MED ORDER — OXYCODONE-ACETAMINOPHEN 5-325 MG PO TABS: 1.0000 | ORAL_TABLET | ORAL | 0 refills | Status: DC | PRN

## 2017-07-26 NOTE — Discharge Summary (Signed)
    Discharge Summary  Admit date: 07/25/2017  Discharge Date and Time:07/26/2017  2:45 PM  Discharge to:  Home  Admission Diagnosis:    MENORRHAGIA, DYSMENORRHEA, FIBROID, DUB                 Discharge  Diagnoses: Active Problems:   Post-operative state   OR Procedures:   Procedure(s): LAPAROSCOPIC ASSISTED VAGINAL HYSTERECTOMY WITH BILATERAL SALPING OOPHERECTOMY INTRAUTERINE DEVICE (IUD) REMOVAL Date -------------------                              Discharge Day Progress Note:   Subjective:   The patient does not have complaints.  She is ambulating well. She is taking PO well. Her pain is well controlled with her current medications. She is urinating without difficulty and is passing flatus.   Objective:  BP 122/65 (BP Location: Left Arm)   Pulse 74   Temp 98 F (36.7 C) (Oral)   Resp 18   Ht 5' (1.524 m)   Wt 156 lb 6 oz (70.9 kg)   LMP  (LMP Unknown)   SpO2 98%   BMI 30.54 kg/m     Abdomen:                         clean, dry, no drainage    Assessment:   Doing well.  Normal progress as expected.     Plan:        Discharge home.                       Medications as directed.  Hospital Course: Patient has shown excellent progress postoperatively.  She desires discharge.  Condition at Discharge:  good Discharge Medications:  Allergies as of 07/26/2017      Reactions   Gluten Meal Diarrhea, Nausea Only      Ondansetron Other (See Comments)   Helps her nausea, but makes HA worse      Medication List    STOP taking these medications   norethindrone 5 MG tablet Commonly known as:  AYGESTIN     TAKE these medications   ALEVE PM 220-25 MG Tabs Generic drug:  Naproxen Sod-diphenhydrAMINE Take 2 tablets by mouth at bedtime.   estradiol 1 MG tablet Commonly known as:  ESTRACE Take 1 tablet (1 mg total) by mouth daily. Start taking on:  07/27/2017   ibuprofen 600 MG tablet Commonly known as:  ADVIL,MOTRIN Take 1 tablet (600 mg total) by mouth every  6 (six) hours as needed.   naproxen sodium 220 MG tablet Commonly known as:  ALEVE Take 440 mg by mouth daily.   omeprazole 20 MG capsule Commonly known as:  PRILOSEC Take 1 capsule (20 mg total) by mouth daily.   oxyCODONE-acetaminophen 5-325 MG tablet Commonly known as:  PERCOCET/ROXICET Take 1-2 tablets by mouth every 4 (four) hours as needed for moderate pain or severe pain.        Follow Up:   Follow-up Information    Harlin Heys, MD Follow up in 1 week(s).   Specialty:  Obstetrics and Gynecology Contact information: [address] [place] [address] [phone]           Finis Bud, M.D. 07/26/2017 2:45 PM

## 2017-07-26 NOTE — Progress Notes (Signed)
Patient discharged home with husband Discharge instructions and prescriptions given and reviewed with patient. Patient verbalized understanding. Escorted out by auxillary.

## 2017-07-27 LAB — SURGICAL PATHOLOGY

## 2017-08-02 ENCOUNTER — Ambulatory Visit (INDEPENDENT_AMBULATORY_CARE_PROVIDER_SITE_OTHER): Payer: PRIVATE HEALTH INSURANCE | Admitting: Obstetrics and Gynecology

## 2017-08-02 ENCOUNTER — Encounter: Payer: Self-pay | Admitting: Obstetrics and Gynecology

## 2017-08-02 VITALS — BP 171/80 | HR 71 | Ht 60.0 in | Wt 156.7 lb

## 2017-08-02 DIAGNOSIS — Z9889 Other specified postprocedural states: Secondary | ICD-10-CM

## 2017-08-02 NOTE — Progress Notes (Signed)
HPI:      [name] is a 52 y.o. [phone] who LMP was No LMP recorded (lmp unknown). Patient has had a hysterectomy.  Subjective:   She presents today approximately 1 week postop from [facility].  She reports she is doing well denies hot flashes denies other problems.  She has minimal pain and minimal vaginal bleeding.  She is eating voiding and having bowel movements without difficulty.    Hx: The following portions of the patient's history were reviewed and updated as appropriate:             She  has a past medical history of Anemia, Celiac disease, Elevated blood pressure reading, Family history of adverse reaction to anesthesia, GERD (gastroesophageal reflux disease), Migraine with visual aura, and UTI (lower urinary tract infection). She does not have any pertinent problems on file. She  has a past surgical history that includes Tubal ligation; Exploratory laparotomy; Bladder suspension; Laparoscopic assisted vaginal hysterectomy (Bilateral, 07/25/2017); and IUD removal (07/25/2017). Her family history includes Diabetes (age of onset: 24) in her son. She  reports that she has never smoked. She has never used smokeless tobacco. She reports that she does not drink alcohol or use drugs. She has a current medication list which includes the following prescription(s): estradiol, ibuprofen, omeprazole, and oxycodone-acetaminophen. She is allergic to gluten meal and ondansetron.       Review of Systems:  Review of Systems  Constitutional: Denied constitutional symptoms, night sweats, recent illness, fatigue, fever, insomnia and weight loss.  Eyes: Denied eye symptoms, eye pain, photophobia, vision change and visual disturbance.  Ears/Nose/Throat/Neck: Denied ear, nose, throat or neck symptoms, hearing loss, nasal discharge, sinus congestion and sore throat.  Cardiovascular: Denied cardiovascular symptoms, arrhythmia, chest pain/pressure, edema, exercise intolerance, orthopnea and palpitations.   Respiratory: Denied pulmonary symptoms, asthma, pleuritic pain, productive sputum, cough, dyspnea and wheezing.  Gastrointestinal: Denied, gastro-esophageal reflux, melena, nausea and vomiting.  Genitourinary:  Denied genitourinary symptoms including symptomatic vaginal discharge, pelvic relaxation issues, and urinary complaints.  Musculoskeletal: Denied musculoskeletal symptoms, stiffness, swelling, muscle weakness and myalgia.  Dermatologic: Denied dermatology symptoms, rash and scar.  Neurologic: Denied neurology symptoms, dizziness, headache, neck pain and syncope.  Psychiatric: Denied psychiatric symptoms, anxiety and depression.  Endocrine: Denied endocrine symptoms including hot flashes and night sweats.   Meds:   Current Outpatient Medications on File Prior to Visit  Medication Sig Dispense Refill  . estradiol (ESTRACE) 1 MG tablet Take 1 tablet (1 mg total) by mouth daily. 30 tablet 1  . ibuprofen (ADVIL,MOTRIN) 600 MG tablet Take 1 tablet (600 mg total) by mouth every 6 (six) hours as needed. 30 tablet 0  . omeprazole (PRILOSEC) 20 MG capsule Take 1 capsule (20 mg total) by mouth daily. 30 capsule 5  . oxyCODONE-acetaminophen (PERCOCET/ROXICET) 5-325 MG tablet Take 1-2 tablets by mouth every 4 (four) hours as needed for moderate pain or severe pain. 20 tablet 0   No current facility-administered medications on file prior to visit.     Objective:     Vitals:   08/02/17 0855  BP: (!) 171/80  Pulse: 71               Abdomen: Soft.  Non-tender.  No masses.  No HSM.  Incision/s: Intact.  Healing well.  No erythema.  No drainage.      Assessment:    [id number] Patient Active Problem List   Diagnosis Date Noted  . Post-operative state 07/25/2017  . Blood loss anemia  03/05/2017  . BV (bacterial vaginosis) 11/27/2012  . UTI (lower urinary tract infection) 11/27/2012  . Migraine with visual aura 09/19/2011  . Depression 09/19/2011  . Constipation 06/25/2011     1.  Post-operative state     Patient with excellent recovery.   Plan:            1.  Continue present management.  Patient may resume normal activities with the exception of heavy lifting and nothing in the vagina.  She will return in 1 month for postop exam. Orders No orders of the defined types were placed in this encounter.   No orders of the defined types were placed in this encounter.     F/U  Return in about 1 month (around 08/30/2017).  Finis Bud, M.D. 08/02/2017 9:21 AM

## 2017-08-17 ENCOUNTER — Ambulatory Visit (INDEPENDENT_AMBULATORY_CARE_PROVIDER_SITE_OTHER): Payer: PRIVATE HEALTH INSURANCE | Admitting: Obstetrics and Gynecology

## 2017-08-17 ENCOUNTER — Encounter: Payer: Self-pay | Admitting: Obstetrics and Gynecology

## 2017-08-17 VITALS — BP 139/89 | HR 67 | Ht 60.0 in | Wt 158.5 lb

## 2017-08-17 DIAGNOSIS — L089 Local infection of the skin and subcutaneous tissue, unspecified: Secondary | ICD-10-CM

## 2017-08-17 DIAGNOSIS — T148XXA Other injury of unspecified body region, initial encounter: Secondary | ICD-10-CM

## 2017-08-17 MED ORDER — AMOXICILLIN-POT CLAVULANATE 875-125 MG PO TABS: 1.0000 | ORAL_TABLET | Freq: Two times a day (BID) | ORAL | 1 refills | Status: DC

## 2017-08-17 NOTE — Progress Notes (Signed)
HPI:      [name] is a 52 y.o. [phone] who LMP was No LMP recorded (lmp unknown). Patient has had a hysterectomy.  Subjective:   She presents today complaining that 1 of her wounds is red and slightly tender to touch.  Denies fever.  Denies any other symptoms.  Patient reports she is doing well without pain.    Hx: The following portions of the patient's history were reviewed and updated as appropriate:             She  has a past medical history of Anemia, Celiac disease, Elevated blood pressure reading, Family history of adverse reaction to anesthesia, GERD (gastroesophageal reflux disease), Migraine with visual aura, and UTI (lower urinary tract infection). She does not have any pertinent problems on file. She  has a past surgical history that includes Tubal ligation; Exploratory laparotomy; Bladder suspension; Laparoscopic assisted vaginal hysterectomy (Bilateral, 07/25/2017); and IUD removal (07/25/2017). Her family history includes Diabetes (age of onset: 74) in her son. She  reports that she has never smoked. She has never used smokeless tobacco. She reports that she does not drink alcohol or use drugs. She has a current medication list which includes the following prescription(s): estradiol, ibuprofen, omeprazole, and amoxicillin-clavulanate. She is allergic to gluten meal and ondansetron.       Review of Systems:  Review of Systems  Constitutional: Denied constitutional symptoms, night sweats, recent illness, fatigue, fever, insomnia and weight loss.  Eyes: Denied eye symptoms, eye pain, photophobia, vision change and visual disturbance.  Ears/Nose/Throat/Neck: Denied ear, nose, throat or neck symptoms, hearing loss, nasal discharge, sinus congestion and sore throat.  Cardiovascular: Denied cardiovascular symptoms, arrhythmia, chest pain/pressure, edema, exercise intolerance, orthopnea and palpitations.  Respiratory: Denied pulmonary symptoms, asthma, pleuritic pain,  productive sputum, cough, dyspnea and wheezing.  Gastrointestinal: Denied, gastro-esophageal reflux, melena, nausea and vomiting.  Genitourinary: Denied genitourinary symptoms including symptomatic vaginal discharge, pelvic relaxation issues, and urinary complaints.  Musculoskeletal: Denied musculoskeletal symptoms, stiffness, swelling, muscle weakness and myalgia.  Dermatologic: See HPI for additional information.  Neurologic: Denied neurology symptoms, dizziness, headache, neck pain and syncope.  Psychiatric: Denied psychiatric symptoms, anxiety and depression.  Endocrine: Denied endocrine symptoms including hot flashes and night sweats.   Meds:   Current Outpatient Medications on File Prior to Visit  Medication Sig Dispense Refill  . estradiol (ESTRACE) 1 MG tablet Take 1 tablet (1 mg total) by mouth daily. 30 tablet 1  . ibuprofen (ADVIL,MOTRIN) 600 MG tablet Take 1 tablet (600 mg total) by mouth every 6 (six) hours as needed. 30 tablet 0  . omeprazole (PRILOSEC) 20 MG capsule Take 1 capsule (20 mg total) by mouth daily. 30 capsule 5   No current facility-administered medications on file prior to visit.     Objective:     Vitals:   08/17/17 0839  BP: 139/89  Pulse: 67              Examination of the left lower quadrant incision shows a small area of erythema surrounding the incision.  There is no drainage.  The incision is intact.  It is not tender to touch.    Assessment:    [id number] Patient Active Problem List   Diagnosis Date Noted  . Post-operative state 07/25/2017  . Blood loss anemia 03/05/2017  . BV (bacterial vaginosis) 11/27/2012  . UTI (lower urinary tract infection) 11/27/2012  . Migraine with visual aura 09/19/2011  . Depression 09/19/2011  . Constipation 06/25/2011  1. Wound infection     Possible early infection versus irritation from clothing.  Patient states that her pants generally rub right on that area.   Plan:            1.  We will  prophylactically treat for 5 days with antibiotic.  Expect rapid resolution.  Discussed covering area so that it is not irritated by clothing. Orders No orders of the defined types were placed in this encounter.    Meds ordered this encounter  Medications  . amoxicillin-clavulanate (AUGMENTIN) 875-125 MG tablet    Sig: Take 1 tablet by mouth 2 (two) times daily.    Dispense:  10 tablet    Refill:  1      F/U  Return for As scheduled.  Finis Bud, M.D. 08/17/2017 9:04 AM

## 2017-08-30 ENCOUNTER — Encounter: Payer: PRIVATE HEALTH INSURANCE | Admitting: Obstetrics and Gynecology

## 2017-08-30 ENCOUNTER — Ambulatory Visit (INDEPENDENT_AMBULATORY_CARE_PROVIDER_SITE_OTHER): Payer: PRIVATE HEALTH INSURANCE | Admitting: Obstetrics and Gynecology

## 2017-08-30 ENCOUNTER — Encounter: Payer: Self-pay | Admitting: Obstetrics and Gynecology

## 2017-08-30 VITALS — BP 153/92 | HR 73 | Ht 60.0 in | Wt 161.3 lb

## 2017-08-30 DIAGNOSIS — Z9889 Other specified postprocedural states: Secondary | ICD-10-CM

## 2017-08-30 NOTE — Progress Notes (Signed)
HPI:      [name] is a 52 y.o. [phone] who LMP was No LMP recorded (lmp unknown). Patient has had a hysterectomy.  Subjective:   She presents today personally 6 weeks from [facility].  She is doing well.  Reports she has significantly increase in energy.  No problems ambulating voiding or eating.  Normal bowel movements.  Has resumed intercourse without issue.  Denies pain.  Reports her incisions are well-healed.    Hx: The following portions of the patient's history were reviewed and updated as appropriate:             She  has a past medical history of Anemia, Celiac disease, Elevated blood pressure reading, Family history of adverse reaction to anesthesia, GERD (gastroesophageal reflux disease), Migraine with visual aura, and UTI (lower urinary tract infection). She does not have any pertinent problems on file. She  has a past surgical history that includes Tubal ligation; Exploratory laparotomy; Bladder suspension; Laparoscopic assisted vaginal hysterectomy (Bilateral, 07/25/2017); and IUD removal (07/25/2017). Her family history includes Diabetes (age of onset: 70) in her son. She  reports that she has never smoked. She has never used smokeless tobacco. She reports that she does not drink alcohol or use drugs. She has a current medication list which includes the following prescription(s): estradiol, ibuprofen, and omeprazole. She is allergic to gluten meal and ondansetron.       Review of Systems:  Review of Systems  Constitutional: Denied constitutional symptoms, night sweats, recent illness, fatigue, fever, insomnia and weight loss.  Eyes: Denied eye symptoms, eye pain, photophobia, vision change and visual disturbance.  Ears/Nose/Throat/Neck: Denied ear, nose, throat or neck symptoms, hearing loss, nasal discharge, sinus congestion and sore throat.  Cardiovascular: Denied cardiovascular symptoms, arrhythmia, chest pain/pressure, edema, exercise intolerance, orthopnea and palpitations.   Respiratory: Denied pulmonary symptoms, asthma, pleuritic pain, productive sputum, cough, dyspnea and wheezing.  Gastrointestinal: Denied, gastro-esophageal reflux, melena, nausea and vomiting.  Genitourinary: Denied genitourinary symptoms including symptomatic vaginal discharge, pelvic relaxation issues, and urinary complaints.  Musculoskeletal: Denied musculoskeletal symptoms, stiffness, swelling, muscle weakness and myalgia.  Dermatologic: Denied dermatology symptoms, rash and scar.  Neurologic: Denied neurology symptoms, dizziness, headache, neck pain and syncope.  Psychiatric: Denied psychiatric symptoms, anxiety and depression.  Endocrine: Denied endocrine symptoms including hot flashes and night sweats.   Meds:   Current Outpatient Medications on File Prior to Visit  Medication Sig Dispense Refill  . estradiol (ESTRACE) 1 MG tablet Take 1 tablet (1 mg total) by mouth daily. 30 tablet 1  . ibuprofen (ADVIL,MOTRIN) 600 MG tablet Take 1 tablet (600 mg total) by mouth every 6 (six) hours as needed. 30 tablet 0  . omeprazole (PRILOSEC) 20 MG capsule Take 1 capsule (20 mg total) by mouth daily. 30 capsule 5   No current facility-administered medications on file prior to visit.     Objective:     Vitals:   08/30/17 1026  BP: (!) 153/92  Pulse: 73               Abdomen: Soft.  Non-tender.  No masses.  No HSM.  Incision/s: Intact.  Healing well.  No erythema.  No drainage.      Assessment:    [id number] Patient Active Problem List   Diagnosis Date Noted  . Post-operative state 07/25/2017  . Blood loss anemia 03/05/2017  . BV (bacterial vaginosis) 11/27/2012  . UTI (lower urinary tract infection) 11/27/2012  . Migraine with visual aura 09/19/2011  . Depression  09/19/2011  . Constipation 06/25/2011     1. Post-operative state     Patient with excellent recovery.  Feels well.   Plan:            1.  Patient may resume normal activities including back to  work. Orders No orders of the defined types were placed in this encounter.   No orders of the defined types were placed in this encounter.     F/U  Return in about 3 months (around 11/30/2017).  Finis Bud, M.D. 08/30/2017 12:36 PM

## 2017-08-30 NOTE — Progress Notes (Signed)
Pt is present today for a follow check after surgery. Pt stated that she is doing well no concerns. Pt incisions are healed well.

## 2017-09-20 ENCOUNTER — Other Ambulatory Visit: Payer: Self-pay | Admitting: Obstetrics and Gynecology

## 2017-11-19 ENCOUNTER — Other Ambulatory Visit: Payer: Self-pay | Admitting: Obstetrics and Gynecology

## 2017-11-26 ENCOUNTER — Other Ambulatory Visit: Payer: Self-pay

## 2017-11-26 ENCOUNTER — Ambulatory Visit (INDEPENDENT_AMBULATORY_CARE_PROVIDER_SITE_OTHER): Payer: PRIVATE HEALTH INSURANCE | Admitting: Family Medicine

## 2017-11-26 ENCOUNTER — Encounter: Payer: Self-pay | Admitting: Family Medicine

## 2017-11-26 VITALS — BP 136/84 | HR 71 | Temp 97.5°F | Resp 17 | Ht 60.63 in | Wt 158.4 lb

## 2017-11-26 DIAGNOSIS — R5383 Other fatigue: Secondary | ICD-10-CM

## 2017-11-26 DIAGNOSIS — N898 Other specified noninflammatory disorders of vagina: Secondary | ICD-10-CM | POA: Diagnosis not present

## 2017-11-26 DIAGNOSIS — Z1239 Encounter for other screening for malignant neoplasm of breast: Secondary | ICD-10-CM

## 2017-11-26 DIAGNOSIS — Z1211 Encounter for screening for malignant neoplasm of colon: Secondary | ICD-10-CM

## 2017-11-26 DIAGNOSIS — F321 Major depressive disorder, single episode, moderate: Secondary | ICD-10-CM | POA: Diagnosis not present

## 2017-11-26 DIAGNOSIS — Z0000 Encounter for general adult medical examination without abnormal findings: Secondary | ICD-10-CM | POA: Diagnosis not present

## 2017-11-26 DIAGNOSIS — Z1231 Encounter for screening mammogram for malignant neoplasm of breast: Secondary | ICD-10-CM | POA: Diagnosis not present

## 2017-11-26 DIAGNOSIS — Z13220 Encounter for screening for lipoid disorders: Secondary | ICD-10-CM

## 2017-11-26 DIAGNOSIS — R350 Frequency of micturition: Secondary | ICD-10-CM

## 2017-11-26 LAB — POCT URINALYSIS DIP (MANUAL ENTRY)
Bilirubin, UA: NEGATIVE
Blood, UA: NEGATIVE
Glucose, UA: NEGATIVE mg/dL
Ketones, POC UA: NEGATIVE mg/dL
Leukocytes, UA: NEGATIVE
Nitrite, UA: NEGATIVE
Protein Ur, POC: NEGATIVE mg/dL
Spec Grav, UA: 1.02 (ref 1.010–1.025)
Urobilinogen, UA: 1 E.U./dL
pH, UA: 7 (ref 5.0–8.0)

## 2017-11-26 LAB — POCT WET + KOH PREP
Trich by wet prep: ABSENT
Yeast by KOH: ABSENT
Yeast by wet prep: ABSENT

## 2017-11-26 NOTE — Patient Instructions (Addendum)
We recommend that you schedule a mammogram for breast cancer screening. Typically, you do not need a referral to do this. Please contact a local imaging center to schedule your mammogram.  [facility] - [phone]  *ask for the [facility] [place] ([place]) - [phone] or [phone]  [facility] - [phone] [place] [phone] [facility] - [phone]  *ask for the [facility] - [phone]  *ask for the Radiology Department [facility] - [phone]  *ask for the [place] - [phone]     If you have lab work done today you will be contacted with your lab results within the next 2 weeks.  If you have not heard from [name] then please contact us. The fastest way to get your results is to register for My Chart.   IF you received an x-ray today, you will receive an invoice from [facility]. Please contact [facility] at [phone] with questions or concerns regarding your invoice.   IF you received labwork today, you will receive an invoice from [facility]. Please contact [facility] at [phone] with questions or concerns regarding your invoice.   Our billing staff will not be able to assist you with questions regarding bills from these companies.  You will be contacted with the lab results as soon as they are available. The fastest way to get your results is to activate your My Chart account. Instructions are located on the last page of this paperwork. If you have not heard from [name] regarding the results in 2 weeks, please contact this office.     Colorectal Cancer Screening Colorectal cancer screening is a group of tests used to check for colorectal cancer. Colorectal refers to your colon and rectum. Your colon and rectum are located at the end of your large intestine and carry your bowel  movements out of your body. Why is colorectal cancer screening done? It is common for abnormal growths (polyps) to form in the lining of your colon, especially as you get older. These polyps can be cancerous or become cancerous. If colorectal cancer is found at an early stage, it is treatable. Who should be screened for colorectal cancer? Screening is recommended for all adults at average risk starting at age 1. Tests may be recommended every 1 to 10 years. Your health care provider may recommend earlier or more frequent screening if you have:  A history of colorectal cancer or polyps.  A family member with a history of colorectal cancer or polyps.  Inflammatory bowel disease, such as ulcerative colitis or Crohn disease.  A type of hereditary colon cancer syndrome.  Colorectal cancer symptoms.  Types of screening tests There are several types of colorectal screening tests. They include:  Guaiac-based fecal occult blood testing.  Fecal immunochemical test (FIT).  Stool DNA test.  Barium enema.  Virtual colonoscopy.  Sigmoidoscopy. During this test, a sigmoidoscope is used to examine your rectum and lower colon. A sigmoidoscope is a flexible tube with a camera that is inserted through your anus into your rectum and lower colon.  Colonoscopy. During this test, a colonoscope is used to examine your entire colon. A colonoscope is a long, thin, flexible tube with a camera. This test examines your entire colon and rectum.  This information is not intended to replace advice given to you by your health care provider. Make sure you discuss any questions you  have with your health care provider. Document Released: 09/16/2009 Document Revised: 11/06/2015 Document Reviewed: 07/05/2013 Elsevier Interactive Patient Education  [employer].

## 2017-11-26 NOTE — Progress Notes (Signed)
Chief Complaint  Patient presents with  . Annual Exam    NO PAP, PT C/O BP'S RUNNING HIGH LATELY AND WANTS TO DISCUSS THIS WITH DOCTOR. URINARY FREQUENCY, ITCHINESS AND ODOR X 1 MONTH  . HX OF MIGRAINES    STARTED TO COME BACK SINCE HAVING SURGERY ON 07/2017 SINCE HYSTERECTOMY.    Subjective:  [name] is a 52 y.o. female here for a health maintenance visit.  Patient is established pt  She reports that she has vaginal irritation She states that she gets some burning and odor with urination She is s/p total hysterectomy and oophorectomy  She reports that she has depression She states that at times she feels like she would be better off dead She states that for years now she has been depressed but for the past few weeks she wakes up at night feeling like she is making things worse for her family Money is a stressor She told her husband and kids her feelings and he told her to never feel like that again She reports that she has been having more and more depression but denies active suicidal thoughts and plan.  She states that she has never gotten counseling or medications for it  Depression screen [facility] 2/9 11/26/2017 10/03/2015 07/06/2015  Decreased Interest 0 0 0  Down, Depressed, Hopeless 0 0 0  PHQ - 2 Score 0 0 0   She denies   Patient Active Problem List   Diagnosis Date Noted  . Post-operative state 07/25/2017  . Blood loss anemia 03/05/2017  . BV (bacterial vaginosis) 11/27/2012  . UTI (lower urinary tract infection) 11/27/2012  . Migraine with visual aura 09/19/2011  . Depression 09/19/2011  . Constipation 06/25/2011    Past Medical History:  Diagnosis Date  . Anemia   . Celiac disease   . Elevated blood pressure reading   . Family history of adverse reaction to anesthesia    MOM-HARD TIME WAKING UP  . GERD (gastroesophageal reflux disease)   . Migraine with visual aura    MIGRAINES  . UTI (lower urinary tract infection)     Past Surgical History:    Procedure Laterality Date  . BLADDER SUSPENSION    . EXPLORATORY LAPAROTOMY    . IUD REMOVAL  07/25/2017   Procedure: INTRAUTERINE DEVICE (IUD) REMOVAL;  Surgeon: Harlin Heys, MD;  Location: [facility] ORS;  Service: Gynecology;;  . LAPAROSCOPIC ASSISTED VAGINAL HYSTERECTOMY Bilateral 07/25/2017   Procedure: LAPAROSCOPIC ASSISTED VAGINAL HYSTERECTOMY WITH BILATERAL [place] OOPHERECTOMY;  Surgeon: Harlin Heys, MD;  Location: [facility] ORS;  Service: Gynecology;  Laterality: Bilateral;  . TUBAL LIGATION       Outpatient Medications Prior to Visit  Medication Sig Dispense Refill  . estradiol (ESTRACE) 1 MG tablet TAKE 1 TABLET BY MOUTH EVERY DAY 30 tablet 0  . ibuprofen (ADVIL,MOTRIN) 600 MG tablet Take 1 tablet (600 mg total) by mouth every 6 (six) hours as needed. 30 tablet 0  . omeprazole (PRILOSEC) 20 MG capsule Take 1 capsule (20 mg total) by mouth daily. 30 capsule 5   No facility-administered medications prior to visit.     Allergies  Allergen Reactions  . Gluten Meal Diarrhea and Nausea Only       . Ondansetron Other (See Comments)    Helps her nausea, but makes HA worse     Family History  Problem Relation Age of Onset  . Diabetes Son 45       type 1     Social History  Socioeconomic History  . Marital status: Married    Spouse name: Jenny Reichmann  . Number of children: 3  . Years of education: 63  . Highest education level: Not on file  Occupational History  . Occupation: Personnel officer  Social Needs  . Financial resource strain: Not on file  . Food insecurity:    Worry: Not on file    Inability: Not on file  . Transportation needs:    Medical: Not on file    Non-medical: Not on file  Tobacco Use  . Smoking status: Never Smoker  . Smokeless tobacco: Never Used  Substance and Sexual Activity  . Alcohol use: No    Alcohol/week: 0.0 standard drinks  . Drug use: No  . Sexual activity: Yes    Partners: Female    Birth control/protection:  Surgical    Comment: INTERCOURSE AGE 39, SEXUAL PARTNERS LEES THAN 5  Lifestyle  . Physical activity:    Days per week: Not on file    Minutes per session: Not on file  . Stress: Not on file  Relationships  . Social connections:    Talks on phone: Not on file    Gets together: Not on file    Attends religious service: Not on file    Active member of club or organization: Not on file    Attends meetings of clubs or organizations: Not on file    Relationship status: Not on file  . Intimate partner violence:    Fear of current or ex partner: Not on file    Emotionally abused: Not on file    Physically abused: Not on file    Forced sexual activity: Not on file  Other Topics Concern  . Not on file  Social History Narrative   Lives with her husband and their three children, and her daughter's boyfriend.  Her older son's daughter lives there part-time as well.   Social History   Substance and Sexual Activity  Alcohol Use No  . Alcohol/week: 0.0 standard drinks   Social History   Tobacco Use  Smoking Status Never Smoker  Smokeless Tobacco Never Used   Social History   Substance and Sexual Activity  Drug Use No    GYN: Sexual Health Menstrual status: regular menses LMP: No LMP recorded (lmp unknown). Patient has had a hysterectomy. Last pap smear: see HM section History of abnormal pap smears:    Health Maintenance: See under health Maintenance activity for review of completion dates as well. Immunization History  Administered Date(s) Administered  . Influenza,inj,Quad PF,6+ Mos 12/25/2012  . Influenza-Unspecified 03/08/2015  . Tdap 07/06/2015      Depression Screen-PHQ2/9 Depression screen [facility] 2/9 11/26/2017 10/03/2015 07/06/2015  Decreased Interest 0 0 0  Down, Depressed, Hopeless 0 0 0  PHQ - 2 Score 0 0 0     Depression Severity and Treatment Recommendations:  0-4= None  5-9= Mild / Treatment: Support, educate to call if worse; return in one month    10-14= Moderate / Treatment: Support, watchful waiting; Antidepressant or Psycotherapy  15-19= Moderately severe / Treatment: Antidepressant OR Psychotherapy  >= 20 = Major depression, severe / Antidepressant AND Psychotherapy    Review of Systems   Review of Systems  Constitutional: Negative for chills, fever and weight loss.  Respiratory: Negative for cough, shortness of breath and wheezing.   Cardiovascular: Negative for chest pain and palpitations.  Gastrointestinal: Negative for abdominal pain, blood in stool, constipation, diarrhea, melena, nausea and vomiting.  Genitourinary: Positive for frequency.  Musculoskeletal: Negative for back pain, myalgias and neck pain.  Skin: Negative for itching and rash.  Neurological: Positive for headaches. Negative for dizziness, tingling and tremors.  Psychiatric/Behavioral: Positive for depression. Negative for hallucinations. The patient is not nervous/anxious and does not have insomnia.     See HPI for ROS as well.    Objective:   Vitals:   11/26/17 1003 11/26/17 1100  BP: (!) 153/100 136/84  Pulse: 71   Resp: 17   Temp: (!) 97.5 F (36.4 C)   TempSrc: Oral   SpO2: 97%   Weight: 158 lb 6.4 oz (71.8 kg)   Height: 5' 0.63" (1.54 m)    BP Readings from Last 3 Encounters:  11/26/17 136/84  08/30/17 (!) 153/92  08/17/17 139/89    Body mass index is 30.3 kg/m.  Physical Exam  Constitutional: She is oriented to person, place, and time. She appears well-developed and well-nourished.  HENT:  Head: Normocephalic and atraumatic.  Poor dentition  Eyes: Conjunctivae and EOM are normal.  Cardiovascular: Normal rate, regular rhythm and normal heart sounds.  No murmur heard. Pulmonary/Chest: Effort normal and breath sounds normal. No stridor. No respiratory distress. She has no wheezes.  Abdominal: Soft. Bowel sounds are normal. She exhibits no distension and no mass. There is no tenderness. There is no guarding.  Neurological: She  is alert and oriented to person, place, and time.  Skin: Skin is warm. Capillary refill takes less than 2 seconds.  Psychiatric: She has a normal mood and affect. Her behavior is normal. Judgment and thought content normal.    Chaperone present Breast symmetric with normal skin  No nipple discharge No palpable masses nontender  GU Cervix and uterus surgically absent Scant white discharge noted in the vagina   Assessment/Plan:   Patient was seen for a health maintenance exam.  Counseled the patient on health maintenance issues. Reviewed her health mainteance schedule and ordered appropriate tests (see orders.) Counseled on regular exercise and weight management. Recommend regular eye exams and dental cleaning.   The following issues were addressed today for health maintenance:   [name] was seen today for annual exam and hx of migraines.  Diagnoses and all orders for this visit:  Health maintenance examination- Women's Health Maintenance Plan Advised monthly breast exam and annual mammogram Advised dental exam every six months Discussed stress management    Urinary frequency -     POCT urinalysis dipstick -     POCT Wet + KOH Prep  Screening for colon cancer -     Ambulatory referral to Gastroenterology  Screening for breast cancer -     MM Digital Screening; Future  Screening, lipid -     Lipid panel  Fatigue, unspecified type- could be due to depression, will screen for underlying risk factors -     Comprehensive metabolic panel -     Vitamin B12 -     VITAMIN D 25 Hydroxy (Vit-D Deficiency, Fractures)  Current moderate episode of major depressive disorder without prior episode ([place])- will have pt follow up in one week to discuss labs and to start pt on an SSRI for deperssion -     TSH -     Comprehensive metabolic panel -     CBC -     Vitamin B12 -     VITAMIN D 25 Hydroxy (Vit-D Deficiency, Fractures) -     Ambulatory referral to Psychiatry  Vaginal  discharge - negative for yeast  or BV -     POCT Wet + KOH Prep      Body mass index is 30.3 kg/m.:  Discussed the patient's BMI with patient. The BMI body mass index is 30.3 kg/m.     Future Appointments  Date Time Provider [place]  11/30/2017  8:30 AM Harlin Heys, MD [facility] None  12/03/2017 10:00 AM Forrest Moron, MD PCP-PCP [facility]    Patient Instructions   We recommend that you schedule a mammogram for breast cancer screening. Typically, you do not need a referral to do this. Please contact a local imaging center to schedule your mammogram.  [facility] - [phone]  *ask for the Radiology [place] ([place]) - [phone] or [phone]  [facility] - [phone] [place] [phone] [facility] - [phone]  *ask for the [facility] - [phone]  *ask for the Radiology Department [facility] - [phone]  *ask for the [place] - [phone]     If you have lab work done today you will be contacted with your lab results within the next 2 weeks.  If you have not heard from [name] then please contact us. The fastest way to get your results is to register for My Chart.   IF you received an x-ray today, you will receive an invoice from [facility] Radiology. Please contact [facility] Radiology at [phone] with questions or concerns regarding your invoice.   IF you received labwork today, you will receive an invoice from [place]. Please contact [facility] at [phone] with questions or concerns regarding your invoice.   Our billing staff will not be able to assist you with questions regarding bills from these companies.  You will be contacted with the lab results as soon as they are available. The fastest way to get your results is to activate your My Chart account. Instructions  are located on the last page of this paperwork. If you have not heard from [name] regarding the results in 2 weeks, please contact this office.     Colorectal Cancer Screening Colorectal cancer screening is a group of tests used to check for colorectal cancer. Colorectal refers to your colon and rectum. Your colon and rectum are located at the end of your large intestine and carry your bowel movements out of your body. Why is colorectal cancer screening done? It is common for abnormal growths (polyps) to form in the lining of your colon, especially as you get older. These polyps can be cancerous or become cancerous. If colorectal cancer is found at an early stage, it is treatable. Who should be screened for colorectal cancer? Screening is recommended for all adults at average risk starting at age 42. Tests may be recommended every 1 to 10 years. Your health care provider may recommend earlier or more frequent screening if you have:  A history of colorectal cancer or polyps.  A family member with a history of colorectal cancer or polyps.  Inflammatory bowel disease, such as ulcerative colitis or Crohn disease.  A type of hereditary colon cancer syndrome.  Colorectal cancer symptoms.  Types of screening tests There are several types of colorectal screening tests. They include:  Guaiac-based fecal occult blood testing.  Fecal immunochemical test (FIT).  Stool DNA test.  Barium enema.  Virtual colonoscopy.  Sigmoidoscopy. During this test, a sigmoidoscope is used to examine your rectum and lower colon.  A sigmoidoscope is a flexible tube with a camera that is inserted through your anus into your rectum and lower colon.  Colonoscopy. During this test, a colonoscope is used to examine your entire colon. A colonoscope is a long, thin, flexible tube with a camera. This test examines your entire colon and rectum.  This information is not intended to replace advice given to you by your health  care provider. Make sure you discuss any questions you have with your health care provider. Document Released: 09/16/2009 Document Revised: 11/06/2015 Document Reviewed: 07/05/2013 Elsevier Interactive Patient Education  [employer].

## 2017-11-28 LAB — VITAMIN B12: Vitamin B-12: 484 pg/mL (ref 232–1245)

## 2017-11-28 LAB — CBC
Hematocrit: 45.8 % (ref 34.0–46.6)
Hemoglobin: 15.4 g/dL (ref 11.1–15.9)
MCH: 31.8 pg (ref 26.6–33.0)
MCHC: 33.6 g/dL (ref 31.5–35.7)
MCV: 95 fL (ref 79–97)
Platelets: 215 10*3/uL (ref 150–450)
RBC: 4.84 x10E6/uL (ref 3.77–5.28)
RDW: 14.3 % (ref 12.3–15.4)
WBC: 2.9 10*3/uL — ABNORMAL LOW (ref 3.4–10.8)

## 2017-11-28 LAB — COMPREHENSIVE METABOLIC PANEL
ALT: 18 IU/L (ref 0–32)
AST: 22 IU/L (ref 0–40)
Albumin/Globulin Ratio: 1.6 (ref 1.2–2.2)
Albumin: 4.1 g/dL (ref 3.5–5.5)
Alkaline Phosphatase: 80 IU/L (ref 39–117)
BUN/Creatinine Ratio: 23 (ref 9–23)
BUN: 17 mg/dL (ref 6–24)
Bilirubin Total: 0.3 mg/dL (ref 0.0–1.2)
CO2: 24 mmol/L (ref 20–29)
Calcium: 9.3 mg/dL (ref 8.7–10.2)
Chloride: 104 mmol/L (ref 96–106)
Creatinine, Ser: 0.73 mg/dL (ref 0.57–1.00)
GFR calc Af Amer: 110 mL/min/{1.73_m2} (ref >59)
GFR calc non Af Amer: 96 mL/min/{1.73_m2} (ref >59)
Globulin, Total: 2.6 g/dL (ref 1.5–4.5)
Glucose: 98 mg/dL (ref 65–99)
Potassium: 4.3 mmol/L (ref 3.5–5.2)
Sodium: 139 mmol/L (ref 134–144)
Total Protein: 6.7 g/dL (ref 6.0–8.5)

## 2017-11-28 LAB — TSH: TSH: 0.932 u[IU]/mL (ref 0.450–4.500)

## 2017-11-28 LAB — LIPID PANEL
Chol/HDL Ratio: 4.3 ratio (ref 0.0–4.4)
Cholesterol, Total: 193 mg/dL (ref 100–199)
HDL: 45 mg/dL (ref >39)
LDL Calculated: 130 mg/dL — ABNORMAL HIGH (ref 0–99)
Triglycerides: 88 mg/dL (ref 0–149)
VLDL Cholesterol Cal: 18 mg/dL (ref 5–40)

## 2017-11-28 LAB — VITAMIN D 25 HYDROXY (VIT D DEFICIENCY, FRACTURES): Vit D, 25-Hydroxy: 22.1 ng/mL — ABNORMAL LOW (ref 30.0–100.0)

## 2017-11-30 ENCOUNTER — Encounter: Payer: PRIVATE HEALTH INSURANCE | Admitting: Obstetrics and Gynecology

## 2017-12-02 ENCOUNTER — Encounter: Payer: PRIVATE HEALTH INSURANCE | Admitting: Obstetrics and Gynecology

## 2017-12-03 ENCOUNTER — Encounter: Payer: Self-pay | Admitting: Family Medicine

## 2017-12-03 ENCOUNTER — Ambulatory Visit (INDEPENDENT_AMBULATORY_CARE_PROVIDER_SITE_OTHER): Payer: PRIVATE HEALTH INSURANCE | Admitting: Family Medicine

## 2017-12-03 VITALS — BP 132/78 | HR 68 | Temp 97.5°F | Resp 18 | Ht 60.0 in | Wt 157.6 lb

## 2017-12-03 DIAGNOSIS — F321 Major depressive disorder, single episode, moderate: Secondary | ICD-10-CM | POA: Diagnosis not present

## 2017-12-03 DIAGNOSIS — E559 Vitamin D deficiency, unspecified: Secondary | ICD-10-CM | POA: Diagnosis not present

## 2017-12-03 MED ORDER — OMEPRAZOLE 20 MG PO CPDR: 20.0000 mg | DELAYED_RELEASE_CAPSULE | Freq: Every day | ORAL | 5 refills | Status: DC

## 2017-12-03 MED ORDER — BUPROPION HCL ER (XL) 150 MG PO TB24: 150.0000 mg | ORAL_TABLET | Freq: Every day | ORAL | 1 refills | Status: DC

## 2017-12-03 NOTE — Progress Notes (Signed)
Chief Complaint  Patient presents with  . Depression    discuss her recent thoughts of depression    HPI  Patient reports that she is a stress eater She states that  Wt Readings from Last 3 Encounters:  12/03/17 157 lb 9.6 oz (71.5 kg)  11/26/17 158 lb 6.4 oz (71.8 kg)  08/30/17 161 lb 4.8 oz (73.2 kg)   She reports that she has not had any suicidal thoughts recently and feels slightly better  She states that she is not sure if she has been on any antidepressants in the past  Depression screen [facility] 2/9 12/03/2017 11/26/2017 10/03/2015 07/06/2015  Decreased Interest 1 0 0 0  Down, Depressed, Hopeless 1 0 0 0  PHQ - 2 Score 2 0 0 0  Altered sleeping 3 - - -  Tired, decreased energy 1 - - -  Change in appetite 3 - - -  Feeling bad or failure about yourself  3 - - -  Trouble concentrating 0 - - -  Moving slowly or fidgety/restless 0 - - -  Suicidal thoughts 1 - - -  PHQ-9 Score 13 - - -  Difficult doing work/chores Not difficult at all - - -     Past Medical History:  Diagnosis Date  . Anemia   . Celiac disease   . Elevated blood pressure reading   . Family history of adverse reaction to anesthesia    MOM-HARD TIME WAKING UP  . GERD (gastroesophageal reflux disease)   . Migraine with visual aura    MIGRAINES  . UTI (lower urinary tract infection)     Current Outpatient Medications  Medication Sig Dispense Refill  . estradiol (ESTRACE) 1 MG tablet TAKE 1 TABLET BY MOUTH EVERY DAY 30 tablet 0  . ibuprofen (ADVIL,MOTRIN) 600 MG tablet Take 1 tablet (600 mg total) by mouth every 6 (six) hours as needed. 30 tablet 0  . omeprazole (PRILOSEC) 20 MG capsule Take 1 capsule (20 mg total) by mouth daily. 30 capsule 5  . buPROPion (WELLBUTRIN XL) 150 MG 24 hr tablet Take 1 tablet (150 mg total) by mouth daily. 30 tablet 1   No current facility-administered medications for this visit.     Allergies:  Allergies  Allergen Reactions  . Gluten Meal Diarrhea and Nausea Only       . Ondansetron Other (See Comments)    Helps her nausea, but makes HA worse    Past Surgical History:  Procedure Laterality Date  . BLADDER SUSPENSION    . EXPLORATORY LAPAROTOMY    . IUD REMOVAL  07/25/2017   Procedure: INTRAUTERINE DEVICE (IUD) REMOVAL;  Surgeon: Harlin Heys, MD;  Location: [facility] ORS;  Service: Gynecology;;  . LAPAROSCOPIC ASSISTED VAGINAL HYSTERECTOMY Bilateral 07/25/2017   Procedure: LAPAROSCOPIC ASSISTED VAGINAL HYSTERECTOMY WITH BILATERAL [place] OOPHERECTOMY;  Surgeon: Harlin Heys, MD;  Location: [facility] ORS;  Service: Gynecology;  Laterality: Bilateral;  . TUBAL LIGATION      Social History   Socioeconomic History  . Marital status: Married    Spouse name: Jenny Reichmann  . Number of children: 3  . Years of education: 31  . Highest education level: Not on file  Occupational History  . Occupation: Personnel officer  Social Needs  . Financial resource strain: Not on file  . Food insecurity:    Worry: Not on file    Inability: Not on file  . Transportation needs:    Medical: Not on file    Non-medical:  Not on file  Tobacco Use  . Smoking status: Never Smoker  . Smokeless tobacco: Never Used  Substance and Sexual Activity  . Alcohol use: No    Alcohol/week: 0.0 standard drinks  . Drug use: No  . Sexual activity: Yes    Partners: Female    Birth control/protection: Surgical    Comment: INTERCOURSE AGE 52, SEXUAL PARTNERS LEES THAN 5  Lifestyle  . Physical activity:    Days per week: Not on file    Minutes per session: Not on file  . Stress: Not on file  Relationships  . Social connections:    Talks on phone: Not on file    Gets together: Not on file    Attends religious service: Not on file    Active member of club or organization: Not on file    Attends meetings of clubs or organizations: Not on file    Relationship status: Not on file  Other Topics Concern  . Not on file  Social History Narrative   Lives with her husband and  their three children, and her daughter's boyfriend.  Her older son's daughter lives there part-time as well.    Family History  Problem Relation Age of Onset  . Diabetes Son 12       type 1     ROS Review of Systems See HPI Constitution: No fevers or chills No malaise No diaphoresis Skin: No rash or itching Eyes: no blurry vision, no double vision GU: no dysuria or hematuria Neuro: no dizziness or headaches all others reviewed and negative   Objective: Vitals:   12/03/17 1003 12/03/17 1022  BP: (!) 171/97 132/78  Pulse: 68   Resp: 18   Temp: (!) 97.5 F (36.4 C)   TempSrc: Oral   SpO2: 95%   Weight: 157 lb 9.6 oz (71.5 kg)   Height: 5' (1.524 m)    BP Readings from Last 3 Encounters:  12/03/17 132/78  11/26/17 136/84  08/30/17 (!) 153/92     Physical Exam  Constitutional: She is oriented to person, place, and time. She appears well-developed and well-nourished.  HENT:  Head: Normocephalic and atraumatic.  Eyes: Conjunctivae and EOM are normal.  Neck: Normal range of motion. Neck supple.  Cardiovascular: Normal rate, regular rhythm and normal heart sounds.  No murmur heard. Pulmonary/Chest: Effort normal and breath sounds normal. No stridor. No respiratory distress. She has no wheezes.  Neurological: She is alert and oriented to person, place, and time.  Skin: Skin is warm. Capillary refill takes less than 2 seconds.  Psychiatric: She has a normal mood and affect. Her behavior is normal. Judgment and thought content normal.    Assessment and Plan Celes was seen today for depression.  Diagnoses and all orders for this visit:  Moderate major depression ([place]) -  Discussed depression and the the impact on health Discussed stress eating Pt has not risk factors for seizures  Would advised bupropion  Discussed risk and side effects  Vitamin D insufficiency Advised 1000 international units bid  Other orders -     buPROPion (WELLBUTRIN XL) 150 MG 24 hr  tablet; Take 1 tablet (150 mg total) by mouth daily.     [place]

## 2017-12-03 NOTE — Addendum Note (Signed)
Addended by: Delia Chimes A on: 12/03/2017 10:31 AM   Modules accepted: Orders

## 2017-12-03 NOTE — Patient Instructions (Addendum)
For low vitamin D  Take vitamin D 1000 international units twice a day  For depression  Take wellbutrin xl 150mg  every other day for a week to watch for side effects  Then increase to every day for   Common side effects: poor appetite, electric shocks, nausea, vomiting, stomach pains, worsening depression, panic attacks, seizure if combined with alcohol, worsening depression or suicidal thoughts (black box warning)   Exercises to consider  Stretching at home, walking after dinner, jumping jacks or climbing stairs  If you have lab work done today you will be contacted with your lab results within the next 2 weeks.  If you have not heard from [name] then please contact us. The fastest way to get your results is to register for My Chart.   IF you received an x-ray today, you will receive an invoice from [facility] Radiology. Please contact [facility] Radiology at [phone] with questions or concerns regarding your invoice.   IF you received labwork today, you will receive an invoice from [facility]. Please contact [facility] at [phone] with questions or concerns regarding your invoice.   Our billing staff will not be able to assist you with questions regarding bills from these companies.  You will be contacted with the lab results as soon as they are available. The fastest way to get your results is to activate your My Chart account. Instructions are located on the last page of this paperwork. If you have not heard from [name] regarding the results in 2 weeks, please contact this office.

## 2017-12-04 ENCOUNTER — Other Ambulatory Visit: Payer: Self-pay | Admitting: Obstetrics and Gynecology

## 2017-12-04 DIAGNOSIS — N920 Excessive and frequent menstruation with regular cycle: Secondary | ICD-10-CM

## 2017-12-17 ENCOUNTER — Other Ambulatory Visit: Payer: Self-pay | Admitting: Obstetrics and Gynecology

## 2017-12-18 ENCOUNTER — Other Ambulatory Visit: Payer: Self-pay

## 2017-12-18 ENCOUNTER — Ambulatory Visit (HOSPITAL_COMMUNITY)
Admission: EM | Admit: 2017-12-18 | Discharge: 2017-12-18 | Disposition: A | Discharge status: Home / Self Care | Payer: PRIVATE HEALTH INSURANCE | Attending: Internal Medicine | Admitting: Internal Medicine

## 2017-12-18 ENCOUNTER — Encounter (HOSPITAL_COMMUNITY): Payer: Self-pay | Admitting: Emergency Medicine

## 2017-12-18 DIAGNOSIS — G43109 Migraine with aura, not intractable, without status migrainosus: Secondary | ICD-10-CM

## 2017-12-18 MED ORDER — DEXAMETHASONE SODIUM PHOSPHATE 10 MG/ML IJ SOLN
INTRAMUSCULAR | Status: AC
  Filled 2017-12-18: qty 1

## 2017-12-18 MED ORDER — DEXAMETHASONE SODIUM PHOSPHATE 10 MG/ML IJ SOLN
10.0000 mg | Freq: Once | INTRAMUSCULAR | Status: AC
  Administered 2017-12-18: 10 mg via INTRAMUSCULAR

## 2017-12-18 MED ORDER — KETOROLAC TROMETHAMINE 60 MG/2ML IM SOLN
60.0000 mg | Freq: Once | INTRAMUSCULAR | Status: AC
  Administered 2017-12-18: 60 mg via INTRAMUSCULAR

## 2017-12-18 MED ORDER — KETOROLAC TROMETHAMINE 60 MG/2ML IM SOLN
INTRAMUSCULAR | Status: AC
  Filled 2017-12-18: qty 2

## 2017-12-18 MED ORDER — METOCLOPRAMIDE HCL 5 MG/ML IJ SOLN
5.0000 mg | Freq: Once | INTRAMUSCULAR | Status: AC
  Administered 2017-12-18: 5 mg via INTRAMUSCULAR

## 2017-12-18 MED ORDER — METOCLOPRAMIDE HCL 5 MG/ML IJ SOLN
INTRAMUSCULAR | Status: AC
  Filled 2017-12-18: qty 2

## 2017-12-18 NOTE — ED Provider Notes (Signed)
[facility]    CSN: [id number] Arrival date & time: 12/18/17  1020     History   Chief Complaint Chief Complaint  Patient presents with  . Headache    HPI [name] is a 52 y.o. female.   [name] presents with complaints of migraine headache which started yesterday at 11am with aura- visual spots. States feels similar to other migraines she has had in the past. Took ibuprofen and Excedrin which have not helped. No ibuprofen today but has been taking Excedrin today. States usually this helps but it has not aborted headache. Nausea present, no vomiting. Light, sound, movement sensitive. Has followed with neurology in the past with negative work up. Does not take any preventative medications for headache. Pain primarily behind left eye and left head. Pain 9/10. No dizziness. No head injury. Did not drive here today. No vision changes. Hx of anemia, celiac, htn, gerd, migraines.     ROS per HPI.      Past Medical History:  Diagnosis Date  . Anemia   . Celiac disease   . Elevated blood pressure reading   . Family history of adverse reaction to anesthesia    MOM-HARD TIME WAKING UP  . GERD (gastroesophageal reflux disease)   . Migraine with visual aura    MIGRAINES  . UTI (lower urinary tract infection)     Patient Active Problem List   Diagnosis Date Noted  . Post-operative state 07/25/2017  . Blood loss anemia 03/05/2017  . BV (bacterial vaginosis) 11/27/2012  . UTI (lower urinary tract infection) 11/27/2012  . Migraine with visual aura 09/19/2011  . Depression 09/19/2011  . Constipation 06/25/2011    Past Surgical History:  Procedure Laterality Date  . BLADDER SUSPENSION    . EXPLORATORY LAPAROTOMY    . IUD REMOVAL  07/25/2017   Procedure: INTRAUTERINE DEVICE (IUD) REMOVAL;  Surgeon: Harlin Heys, MD;  Location: [facility] ORS;  Service: Gynecology;;  . LAPAROSCOPIC ASSISTED VAGINAL HYSTERECTOMY Bilateral 07/25/2017   Procedure: LAPAROSCOPIC ASSISTED  VAGINAL HYSTERECTOMY WITH BILATERAL [place] OOPHERECTOMY;  Surgeon: Harlin Heys, MD;  Location: [facility] ORS;  Service: Gynecology;  Laterality: Bilateral;  . TUBAL LIGATION      OB History    Gravida  3   Para  3   Term  3   Preterm      AB      Living  3     SAB      TAB      Ectopic      Multiple      Live Births  3            Home Medications    Prior to Admission medications   Medication Sig Start Date End Date Taking? Authorizing Provider  aspirin-acetaminophen-caffeine (EXCEDRIN MIGRAINE) [phone] MG tablet Take by mouth every 6 (six) hours as needed for headache.   Yes [provider]  ibuprofen (ADVIL,MOTRIN) 600 MG tablet Take 1 tablet (600 mg total) by mouth every 6 (six) hours as needed. 06/25/17  Yes Arta Silence, MD  buPROPion (WELLBUTRIN XL) 150 MG 24 hr tablet Take 1 tablet (150 mg total) by mouth daily. 12/03/17   Forrest Moron, MD  estradiol (ESTRACE) 1 MG tablet TAKE 1 TABLET BY MOUTH EVERY DAY 11/20/17   Harlin Heys, MD  omeprazole (PRILOSEC) 20 MG capsule Take 1 capsule (20 mg total) by mouth daily. 12/03/17   Forrest Moron, MD    Family History Family History  Problem Relation Age of Onset  . Diabetes Son 32       type 1    Social History Social History   Tobacco Use  . Smoking status: Never Smoker  . Smokeless tobacco: Never Used  Substance Use Topics  . Alcohol use: No    Alcohol/week: 0.0 standard drinks  . Drug use: No     Allergies   Gluten meal and Ondansetron   Review of Systems Review of Systems   Physical Exam Triage Vital Signs ED Triage Vitals  Enc Vitals Group     BP 12/18/17 1140 (!) 154/99     Pulse Rate 12/18/17 1140 82     Resp 12/18/17 1140 18     Temp 12/18/17 1140 97.9 F (36.6 C)     Temp Source 12/18/17 1140 Oral     SpO2 12/18/17 1140 96 %     Weight --      Height --      Head Circumference --      Peak Flow --      Pain Score 12/18/17 1137 9     Pain Loc  --      Pain Edu? --      Excl. in [place]? --    No data found.  Updated Vital Signs BP (!) 154/99 (BP Location: Right Arm)   Pulse 82   Temp 97.9 F (36.6 C) (Oral)   Resp 18   LMP  (LMP Unknown)   SpO2 96%    Physical Exam  Constitutional: She is oriented to person, place, and time. She appears well-developed and well-nourished. No distress.  Eyes: Pupils are equal, round, and reactive to light. EOM are normal.  Cardiovascular: Normal rate, regular rhythm and normal heart sounds.  Pulmonary/Chest: Effort normal and breath sounds normal.  Neurological: She is alert and oriented to person, place, and time. She has normal strength. She is not disoriented. No cranial nerve deficit or sensory deficit. GCS eye subscore is 4. GCS verbal subscore is 5. GCS motor subscore is 6.  Skin: Skin is warm and dry.  Psychiatric: She has a normal mood and affect.     UC Treatments / Results  Labs (all labs ordered are listed, but only abnormal results are displayed) Labs Reviewed - No data to display  EKG None  Radiology No results found.  Procedures Procedures (including critical care time)  Medications Ordered in UC Medications  ketorolac (TORADOL) injection 60 mg (60 mg Intramuscular Given 12/18/17 1210)  metoCLOPramide (REGLAN) injection 5 mg (5 mg Intramuscular Given 12/18/17 1210)  dexamethasone (DECADRON) injection 10 mg (10 mg Intramuscular Given 12/18/17 1210)    Initial Impression / Assessment and Plan / UC Course  I have reviewed the triage vital signs and the nursing notes.  Pertinent labs & imaging results that were available during my care of the patient were reviewed by me and considered in my medical decision making (see chart for details).     No red flag findings on exam, normal neurological exam. History of migraines, feels similar. Migraine cocktail provided in clinic here today. Encouraged rest in dark room with low stimuli. Return precautions provided. Patient  verbalized understanding and agreeable to plan.  Ambulatory out of clinic without difficulty. Husband drove her home.  Final Clinical Impressions(s) / UC Diagnoses   Final diagnoses:  Migraine with aura and without status migrainosus, not intractable     Discharge Instructions     Do not take additional ibuprofen for another  8 hours today.  May take Excedrin later today if needed.  Sleep in quiet dark room, I would expect improvement with medications provided today.  If develop worsening of pain, vision changes, vomiting, dizziness, weakness, or otherwise worsening please return or go to the Er.  If persistent please follow up with your primary care provider.    ED Prescriptions    None     Controlled Substance Prescriptions [place] Controlled Substance Registry consulted? Not Applicable   Zigmund Gottron, NP 12/18/17 1216

## 2017-12-18 NOTE — ED Triage Notes (Signed)
Headache started yesterday.  History of migraines.  Pain behind left eye and top of head.  Patient says this is typical migraine.

## 2017-12-18 NOTE — Discharge Instructions (Signed)
Do not take additional ibuprofen for another 8 hours today.  May take Excedrin later today if needed.  Sleep in quiet dark room, I would expect improvement with medications provided today.  If develop worsening of pain, vision changes, vomiting, dizziness, weakness, or otherwise worsening please return or go to the Er.  If persistent please follow up with your primary care provider.

## 2017-12-30 ENCOUNTER — Other Ambulatory Visit: Payer: Self-pay | Admitting: Obstetrics and Gynecology

## 2018-01-12 ENCOUNTER — Other Ambulatory Visit: Payer: Self-pay

## 2018-01-12 ENCOUNTER — Ambulatory Visit (HOSPITAL_COMMUNITY)
Admission: EM | Admit: 2018-01-12 | Discharge: 2018-01-12 | Disposition: A | Discharge status: Home / Self Care | Payer: PRIVATE HEALTH INSURANCE | Attending: Family Medicine | Admitting: Family Medicine

## 2018-01-12 ENCOUNTER — Encounter (HOSPITAL_COMMUNITY): Payer: Self-pay | Admitting: Family Medicine

## 2018-01-12 DIAGNOSIS — G43109 Migraine with aura, not intractable, without status migrainosus: Secondary | ICD-10-CM

## 2018-01-12 MED ORDER — DEXAMETHASONE SODIUM PHOSPHATE 10 MG/ML IJ SOLN
INTRAMUSCULAR | Status: AC
  Filled 2018-01-12: qty 1

## 2018-01-12 MED ORDER — KETOROLAC TROMETHAMINE 60 MG/2ML IM SOLN
INTRAMUSCULAR | Status: AC
Start: 2018-01-12 — End: ?
  Filled 2018-01-12: qty 2

## 2018-01-12 MED ORDER — KETOROLAC TROMETHAMINE 60 MG/2ML IM SOLN
60.0000 mg | Freq: Once | INTRAMUSCULAR | Status: AC
  Administered 2018-01-12: 60 mg via INTRAMUSCULAR

## 2018-01-12 MED ORDER — METOCLOPRAMIDE HCL 5 MG/ML IJ SOLN
5.0000 mg | Freq: Once | INTRAMUSCULAR | Status: AC
  Administered 2018-01-12: 5 mg via INTRAMUSCULAR

## 2018-01-12 MED ORDER — METOCLOPRAMIDE HCL 5 MG/ML IJ SOLN
INTRAMUSCULAR | Status: AC
  Filled 2018-01-12: qty 2

## 2018-01-12 MED ORDER — DEXAMETHASONE SODIUM PHOSPHATE 10 MG/ML IJ SOLN
10.0000 mg | Freq: Once | INTRAMUSCULAR | Status: AC
  Administered 2018-01-12: 10 mg via INTRAMUSCULAR

## 2018-01-12 NOTE — ED Triage Notes (Signed)
Pt states she has a migraine this started yesterday at about 1 pm.

## 2018-01-12 NOTE — Discharge Instructions (Addendum)
He may benefit from a monthly injection of Aimovig.  Please discuss this with your primary care providers.

## 2018-01-12 NOTE — ED Provider Notes (Signed)
[facility]    CSN: [id number] Arrival date & time: 01/12/18  2774     History   Chief Complaint Chief Complaint  Patient presents with  . Migraine    HPI [name] is a 52 y.o. female.   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  She has had no nausea or vomiting.  She does have the same symptoms she had back on September 8.  She gets about 2 migraine headaches per month.  She is tried Topamax in the past but it gave her some neck stiffness and so she would rather not take it.  She has not tried Aimovig yet.  She normally goes to [place] urgent care.   Urgent Care note from 12/18/2017: Jule presents with complaints of migraine headache which started yesterday at 11am with aura- visual spots. States feels similar to other migraines she has had in the past. Took ibuprofen and Excedrin which have not helped. No ibuprofen today but has been taking Excedrin today. States usually this helps but it has not aborted headache. Nausea present, no vomiting. Light, sound, movement sensitive. Has followed with neurology in the past with negative work up. Does not take any preventative medications for headache. Pain primarily behind left eye and left head. Pain 9/10. No dizziness. No head injury. Did not drive here today. No vision changes. Hx of anemia, celiac, htn, gerd, migraines.       Past Medical History:  Diagnosis Date  . Anemia   . Celiac disease   . Elevated blood pressure reading   . Family history of adverse reaction to anesthesia    MOM-HARD TIME WAKING UP  . GERD (gastroesophageal reflux disease)   . Migraine with visual aura    MIGRAINES  . UTI (lower urinary tract infection)     Patient Active Problem List   Diagnosis Date Noted  . Constipation 06/25/2011    Priority: High  . Post-operative state 07/25/2017  . Blood loss anemia 03/05/2017  . BV (bacterial vaginosis) 11/27/2012  . UTI (lower urinary tract  infection) 11/27/2012  . Migraine with visual aura 09/19/2011  . Depression 09/19/2011    Past Surgical History:  Procedure Laterality Date  . BLADDER SUSPENSION    . EXPLORATORY LAPAROTOMY    . IUD REMOVAL  07/25/2017   Procedure: INTRAUTERINE DEVICE (IUD) REMOVAL;  Surgeon: Harlin Heys, MD;  Location: [facility] ORS;  Service: Gynecology;;  . LAPAROSCOPIC ASSISTED VAGINAL HYSTERECTOMY Bilateral 07/25/2017   Procedure: LAPAROSCOPIC ASSISTED VAGINAL HYSTERECTOMY WITH BILATERAL [place] OOPHERECTOMY;  Surgeon: Harlin Heys, MD;  Location: [facility] ORS;  Service: Gynecology;  Laterality: Bilateral;  . TUBAL LIGATION      OB History    Gravida  3   Para  3   Term  3   Preterm      AB      Living  3     SAB      TAB      Ectopic      Multiple      Live Births  3            Home Medications    Prior to Admission medications   Medication Sig Start Date End Date Taking? Authorizing Provider  aspirin-acetaminophen-caffeine (EXCEDRIN MIGRAINE) [phone] MG tablet Take by mouth every 6 (six) hours as needed for headache.    [provider]  buPROPion (WELLBUTRIN XL) 150 MG 24 hr tablet Take 1 tablet (150 mg  total) by mouth daily. 12/03/17   Forrest Moron, MD  estradiol (ESTRACE) 1 MG tablet TAKE 1 TABLET BY MOUTH EVERY DAY 11/20/17   Harlin Heys, MD  ibuprofen (ADVIL,MOTRIN) 600 MG tablet Take 1 tablet (600 mg total) by mouth every 6 (six) hours as needed. 06/25/17   Arta Silence, MD  omeprazole (PRILOSEC) 20 MG capsule Take 1 capsule (20 mg total) by mouth daily. 12/03/17   Forrest Moron, MD    Family History Family History  Problem Relation Age of Onset  . Diabetes Son 6       type 1    Social History Social History   Tobacco Use  . Smoking status: Never Smoker  . Smokeless tobacco: Never Used  Substance Use Topics  . Alcohol use: No    Alcohol/week: 0.0 standard drinks  . Drug use: No     Allergies   Gluten meal and  Ondansetron   Review of Systems Review of Systems  Eyes: Positive for visual disturbance.  Neurological: Positive for headaches.  All other systems reviewed and are negative.    Physical Exam Triage Vital Signs ED Triage Vitals  Enc Vitals Group     BP      Pulse      Resp      Temp      Temp src      SpO2      Weight      Height      Head Circumference      Peak Flow      Pain Score      Pain Loc      Pain Edu?      Excl. in [place]?    No data found.  Updated Vital Signs BP (!) 151/93 (BP Location: Left Arm)   Pulse 83   Temp 97.6 F (36.4 C)   Resp 18   Wt 71.2 kg   LMP  (LMP Unknown)   SpO2 98%   BMI 30.66 kg/m    Physical Exam  Constitutional: She is oriented to person, place, and time. She appears well-developed and well-nourished.  HENT:  Head: Normocephalic.  Right Ear: External ear normal.  Left Ear: External ear normal.  Teeth are in poor condition  Eyes: Pupils are equal, round, and reactive to light. Conjunctivae and EOM are normal.  Neck: Normal range of motion. Neck supple.  Cardiovascular: Normal rate, regular rhythm and normal heart sounds.  Pulmonary/Chest: Effort normal and breath sounds normal.  Musculoskeletal: Normal range of motion.  Neurological: She is alert and oriented to person, place, and time.  Skin: Skin is warm and dry.  Nursing note and vitals reviewed.    UC Treatments / Results  Labs (all labs ordered are listed, but only abnormal results are displayed) Labs Reviewed - No data to display  EKG None  Radiology No results found.  Procedures Procedures (including critical care time)  Medications Ordered in UC Medications  ketorolac (TORADOL) injection 60 mg (has no administration in time range)  metoCLOPramide (REGLAN) injection 5 mg (has no administration in time range)  dexamethasone (DECADRON) injection 10 mg (has no administration in time range)    Initial Impression / Assessment and Plan / UC Course  I  have reviewed the triage vital signs and the nursing notes.  Pertinent labs & imaging results that were available during my care of the patient were reviewed by me and considered in my medical decision making (see chart for details).  Final Clinical Impressions(s) / UC Diagnoses   Final diagnoses:  Migraine with aura and without status migrainosus, not intractable     Discharge Instructions     He may benefit from a monthly injection of Aimovig.  Please discuss this with your primary care providers.    ED Prescriptions    None     Controlled Substance Prescriptions [place] Controlled Substance Registry consulted? Not Applicable   Robyn Haber, MD 01/12/18 [phone]

## 2018-01-20 ENCOUNTER — Other Ambulatory Visit: Payer: Self-pay | Admitting: Obstetrics and Gynecology

## 2018-01-20 DIAGNOSIS — N920 Excessive and frequent menstruation with regular cycle: Secondary | ICD-10-CM

## 2018-02-03 ENCOUNTER — Encounter (HOSPITAL_COMMUNITY): Payer: Self-pay

## 2018-02-03 ENCOUNTER — Other Ambulatory Visit: Payer: Self-pay

## 2018-02-03 ENCOUNTER — Ambulatory Visit (HOSPITAL_COMMUNITY)
Admission: EM | Admit: 2018-02-03 | Discharge: 2018-02-03 | Disposition: A | Discharge status: Home / Self Care | Payer: PRIVATE HEALTH INSURANCE | Attending: Emergency Medicine | Admitting: Emergency Medicine

## 2018-02-03 DIAGNOSIS — G43009 Migraine without aura, not intractable, without status migrainosus: Secondary | ICD-10-CM | POA: Diagnosis not present

## 2018-02-03 MED ORDER — KETOROLAC TROMETHAMINE 60 MG/2ML IM SOLN
60.0000 mg | Freq: Once | INTRAMUSCULAR | Status: AC
  Administered 2018-02-03: 60 mg via INTRAMUSCULAR

## 2018-02-03 MED ORDER — METOCLOPRAMIDE HCL 5 MG/ML IJ SOLN
INTRAMUSCULAR | Status: AC
  Filled 2018-02-03: qty 2

## 2018-02-03 MED ORDER — DEXAMETHASONE SODIUM PHOSPHATE 10 MG/ML IJ SOLN
INTRAMUSCULAR | Status: AC
  Filled 2018-02-03: qty 1

## 2018-02-03 MED ORDER — DIPHENHYDRAMINE HCL 25 MG PO CAPS
ORAL_CAPSULE | ORAL | Status: AC
  Filled 2018-02-03: qty 1

## 2018-02-03 MED ORDER — METOCLOPRAMIDE HCL 5 MG/ML IJ SOLN
5.0000 mg | Freq: Once | INTRAMUSCULAR | Status: AC
  Administered 2018-02-03: 5 mg via INTRAMUSCULAR

## 2018-02-03 MED ORDER — DIPHENHYDRAMINE HCL 25 MG PO CAPS
25.0000 mg | ORAL_CAPSULE | Freq: Once | ORAL | Status: AC
  Administered 2018-02-03: 25 mg via ORAL

## 2018-02-03 MED ORDER — DEXAMETHASONE SODIUM PHOSPHATE 10 MG/ML IJ SOLN
10.0000 mg | Freq: Once | INTRAMUSCULAR | Status: AC
  Administered 2018-02-03: 10 mg via INTRAMUSCULAR

## 2018-02-03 MED ORDER — KETOROLAC TROMETHAMINE 60 MG/2ML IM SOLN
INTRAMUSCULAR | Status: AC
  Filled 2018-02-03: qty 2

## 2018-02-03 NOTE — ED Provider Notes (Signed)
[place]    CSN: [id number] Arrival date & time: 02/03/18  1530     History   Chief Complaint Chief Complaint  Patient presents with  . Migraine    HPI Ladonne Sharples is a 52 y.o. female.   Genesi presents with complaints of migraine headache. Entire head is throbbing. Rates it 10/10. Started last night at 10:30p, not improving. Nausea, no vomiting. Light, sound and movement worsen the pain. No dizziness. Feels similar to other migraines but has not responded to medications. Seen for similar last 10/3. Has taken excedrine migraine, ibuprofen and goodies which have not helped. States gets approximately 2-3 migraines a month. Doesn't follow regularly with a pcp. States has seen neurology in the past with negative evaluation. Hx of celiac, htn, gerd, migraines, depression.    ROS per HPI.      Past Medical History:  Diagnosis Date  . Anemia   . Celiac disease   . Elevated blood pressure reading   . Family history of adverse reaction to anesthesia    MOM-HARD TIME WAKING UP  . GERD (gastroesophageal reflux disease)   . Migraine with visual aura    MIGRAINES  . UTI (lower urinary tract infection)     Patient Active Problem List   Diagnosis Date Noted  . Post-operative state 07/25/2017  . Blood loss anemia 03/05/2017  . BV (bacterial vaginosis) 11/27/2012  . UTI (lower urinary tract infection) 11/27/2012  . Migraine with visual aura 09/19/2011  . Depression 09/19/2011  . Constipation 06/25/2011    Past Surgical History:  Procedure Laterality Date  . BLADDER SUSPENSION    . EXPLORATORY LAPAROTOMY    . IUD REMOVAL  07/25/2017   Procedure: INTRAUTERINE DEVICE (IUD) REMOVAL;  Surgeon: Harlin Heys, MD;  Location: [facility] ORS;  Service: Gynecology;;  . LAPAROSCOPIC ASSISTED VAGINAL HYSTERECTOMY Bilateral 07/25/2017   Procedure: LAPAROSCOPIC ASSISTED VAGINAL HYSTERECTOMY WITH BILATERAL [place] OOPHERECTOMY;  Surgeon: Harlin Heys, MD;  Location: [facility]  ORS;  Service: Gynecology;  Laterality: Bilateral;  . TUBAL LIGATION      OB History    Gravida  3   Para  3   Term  3   Preterm      AB      Living  3     SAB      TAB      Ectopic      Multiple      Live Births  3            Home Medications    Prior to Admission medications   Medication Sig Start Date End Date Taking? Authorizing Provider  aspirin-acetaminophen-caffeine (EXCEDRIN MIGRAINE) [phone] MG tablet Take by mouth every 6 (six) hours as needed for headache.    [provider]  buPROPion (WELLBUTRIN XL) 150 MG 24 hr tablet Take 1 tablet (150 mg total) by mouth daily. 12/03/17   Forrest Moron, MD  estradiol (ESTRACE) 1 MG tablet TAKE 1 TABLET BY MOUTH EVERY DAY 11/20/17   Harlin Heys, MD  ibuprofen (ADVIL,MOTRIN) 600 MG tablet Take 1 tablet (600 mg total) by mouth every 6 (six) hours as needed. 06/25/17   Arta Silence, MD  omeprazole (PRILOSEC) 20 MG capsule Take 1 capsule (20 mg total) by mouth daily. 12/03/17   Forrest Moron, MD    Family History Family History  Problem Relation Age of Onset  . Diabetes Son 94       type 1    Social  History Social History   Tobacco Use  . Smoking status: Never Smoker  . Smokeless tobacco: Never Used  Substance Use Topics  . Alcohol use: No    Alcohol/week: 0.0 standard drinks  . Drug use: No     Allergies   Gluten meal and Ondansetron   Review of Systems Review of Systems   Physical Exam Triage Vital Signs ED Triage Vitals  Enc Vitals Group     BP 02/03/18 1554 (!) 151/102     Pulse --      Resp 02/03/18 1554 18     Temp 02/03/18 1554 98 F (36.7 C)     Temp src --      SpO2 02/03/18 1554 99 %     Weight 02/03/18 1553 156 lb (70.8 kg)     Height --      Head Circumference --      Peak Flow --      Pain Score 02/03/18 1553 10     Pain Loc --      Pain Edu? --      Excl. in [place]? --    No data found.  Updated Vital Signs BP (!) 151/102 (BP Location: Right  Arm)   Temp 98 F (36.7 C)   Resp 18   Wt 156 lb (70.8 kg)   LMP  (LMP Unknown)   SpO2 99%   BMI 30.47 kg/m    Physical Exam  Constitutional: She is oriented to person, place, and time. She appears well-developed and well-nourished. No distress.  HENT:  Head: Normocephalic and atraumatic.  Right Ear: External ear normal.  Left Ear: External ear normal.  Eyes: Pupils are equal, round, and reactive to light. Conjunctivae and EOM are normal.  Neck: Normal range of motion.  Cardiovascular: Normal rate, regular rhythm and normal heart sounds.  Pulmonary/Chest: Effort normal and breath sounds normal.  Neurological: She is alert and oriented to person, place, and time. No cranial nerve deficit or sensory deficit. Coordination normal.  Skin: Skin is warm and dry.     UC Treatments / Results  Labs (all labs ordered are listed, but only abnormal results are displayed) Labs Reviewed - No data to display  EKG None  Radiology No results found.  Procedures Procedures (including critical care time)  Medications Ordered in UC Medications  ketorolac (TORADOL) injection 60 mg (has no administration in time range)  metoCLOPramide (REGLAN) injection 5 mg (has no administration in time range)  dexamethasone (DECADRON) injection 10 mg (has no administration in time range)  diphenhydrAMINE (BENADRYL) capsule 25 mg (has no administration in time range)    Initial Impression / Assessment and Plan / UC Course  I have reviewed the triage vital signs and the nursing notes.  Pertinent labs & imaging results that were available during my care of the patient were reviewed by me and considered in my medical decision making (see chart for details).     No acute neurological findings on exam. Patient endorses feeling similar to other migraines. Husband is driving her home. Headache cocktail provided. Return precautions provided. Encouraged to follow up with pcp as may benefit from preventative  treatment. Patient verbalized understanding and agreeable to plan.    Final Clinical Impressions(s) / UC Diagnoses   Final diagnoses:  Migraine without aura and without status migrainosus, not intractable     Discharge Instructions     Rest in dark with low stimulus to sleep once home which should help abort headache.  May  take additional ibuprofen in another 8 hours.  If develop worsening of headache, dizziness, vision changes, or otherwise worsening please go to the Er.  Please follow up with primary care provider as you may benefit from further migraine prevention.    ED Prescriptions    None     Controlled Substance Prescriptions [place] Controlled Substance Registry consulted? Not Applicable   Zigmund Gottron, NP 02/03/18 1620

## 2018-02-03 NOTE — Discharge Instructions (Signed)
Rest in dark with low stimulus to sleep once home which should help abort headache.  May take additional ibuprofen in another 8 hours.  If develop worsening of headache, dizziness, vision changes, or otherwise worsening please go to the Er.  Please follow up with primary care provider as you may benefit from further migraine prevention.

## 2018-02-03 NOTE — ED Triage Notes (Signed)
Pt states she has a headache x 2 days

## 2018-02-05 ENCOUNTER — Other Ambulatory Visit: Payer: Self-pay | Admitting: Family Medicine

## 2018-02-06 NOTE — Telephone Encounter (Signed)
Requested Prescriptions  Pending Prescriptions Disp Refills  . buPROPion (WELLBUTRIN XL) 150 MG 24 hr tablet [Pharmacy Med Name: BUPROPION HCL XL 150 MG TABLET] 90 tablet 1    Sig: TAKE 1 TABLET BY MOUTH EVERY DAY     Psychiatry: Antidepressants - bupropion Failed - 02/05/2018  9:32 AM      Failed - Last BP in normal range    BP Readings from Last 1 Encounters:  02/03/18 (!) 151/102         Passed - Completed PHQ-2 or PHQ-9 in the last 360 days.      Passed - Valid encounter within last 6 months    Recent Outpatient Visits          2 months ago Moderate major depression ([place])   Primary Care at [facility], Arlie Solomons, MD   2 months ago Health maintenance examination   Primary Care at [facility], Arlie Solomons, MD   2 years ago Post-viral cough syndrome   [facility] at [facility], Dalbert Batman, [place]   2 years ago Urinary frequency   Primary Care at [place], PA-C   3 years ago Frequent urination   Primary Care at [facility], [facility]

## 2018-02-21 ENCOUNTER — Encounter (HOSPITAL_COMMUNITY): Payer: Self-pay

## 2018-02-21 ENCOUNTER — Ambulatory Visit (HOSPITAL_COMMUNITY)
Admission: EM | Admit: 2018-02-21 | Discharge: 2018-02-21 | Disposition: A | Discharge status: Home / Self Care | Payer: PRIVATE HEALTH INSURANCE | Attending: Family Medicine | Admitting: Family Medicine

## 2018-02-21 DIAGNOSIS — R030 Elevated blood-pressure reading, without diagnosis of hypertension: Secondary | ICD-10-CM | POA: Insufficient documentation

## 2018-02-21 DIAGNOSIS — Z7982 Long term (current) use of aspirin: Secondary | ICD-10-CM | POA: Insufficient documentation

## 2018-02-21 DIAGNOSIS — K900 Celiac disease: Secondary | ICD-10-CM | POA: Diagnosis not present

## 2018-02-21 DIAGNOSIS — Z791 Long term (current) use of non-steroidal anti-inflammatories (NSAID): Secondary | ICD-10-CM | POA: Insufficient documentation

## 2018-02-21 DIAGNOSIS — D649 Anemia, unspecified: Secondary | ICD-10-CM | POA: Diagnosis not present

## 2018-02-21 DIAGNOSIS — G43109 Migraine with aura, not intractable, without status migrainosus: Secondary | ICD-10-CM | POA: Insufficient documentation

## 2018-02-21 DIAGNOSIS — K219 Gastro-esophageal reflux disease without esophagitis: Secondary | ICD-10-CM | POA: Insufficient documentation

## 2018-02-21 DIAGNOSIS — F329 Major depressive disorder, single episode, unspecified: Secondary | ICD-10-CM | POA: Insufficient documentation

## 2018-02-21 DIAGNOSIS — R51 Headache: Secondary | ICD-10-CM | POA: Diagnosis present

## 2018-02-21 DIAGNOSIS — Z79899 Other long term (current) drug therapy: Secondary | ICD-10-CM | POA: Diagnosis not present

## 2018-02-21 DIAGNOSIS — N390 Urinary tract infection, site not specified: Secondary | ICD-10-CM | POA: Diagnosis present

## 2018-02-21 DIAGNOSIS — N898 Other specified noninflammatory disorders of vagina: Secondary | ICD-10-CM | POA: Insufficient documentation

## 2018-02-21 LAB — POCT URINALYSIS DIP (DEVICE)
Bilirubin Urine: NEGATIVE
Glucose, UA: NEGATIVE mg/dL
Ketones, ur: NEGATIVE mg/dL
Leukocytes, UA: NEGATIVE
Nitrite: NEGATIVE
Protein, ur: NEGATIVE mg/dL
Specific Gravity, Urine: 1.02 (ref 1.005–1.030)
Urobilinogen, UA: 1 mg/dL (ref 0.0–1.0)
pH: 7 (ref 5.0–8.0)

## 2018-02-21 MED ORDER — DEXAMETHASONE SODIUM PHOSPHATE 10 MG/ML IJ SOLN
INTRAMUSCULAR | Status: AC
  Filled 2018-02-21: qty 1

## 2018-02-21 MED ORDER — METOCLOPRAMIDE HCL 5 MG/ML IJ SOLN
5.0000 mg | Freq: Once | INTRAMUSCULAR | Status: AC
  Administered 2018-02-21: 5 mg via INTRAMUSCULAR

## 2018-02-21 MED ORDER — FLUCONAZOLE 150 MG PO TABS: 150.0000 mg | ORAL_TABLET | Freq: Once | ORAL | 0 refills | Status: AC

## 2018-02-21 MED ORDER — KETOROLAC TROMETHAMINE 60 MG/2ML IM SOLN
INTRAMUSCULAR | Status: AC
  Filled 2018-02-21: qty 2

## 2018-02-21 MED ORDER — METOCLOPRAMIDE HCL 5 MG/ML IJ SOLN
INTRAMUSCULAR | Status: AC
  Filled 2018-02-21: qty 2

## 2018-02-21 MED ORDER — KETOROLAC TROMETHAMINE 60 MG/2ML IM SOLN
60.0000 mg | Freq: Once | INTRAMUSCULAR | Status: AC
  Administered 2018-02-21: 60 mg via INTRAMUSCULAR

## 2018-02-21 MED ORDER — DEXAMETHASONE SODIUM PHOSPHATE 10 MG/ML IJ SOLN
10.0000 mg | Freq: Once | INTRAMUSCULAR | Status: AC
  Administered 2018-02-21: 10 mg via INTRAMUSCULAR

## 2018-02-21 NOTE — ED Provider Notes (Signed)
[place]    CSN: [id number] Arrival date & time: 02/21/18  2353     History   Chief Complaint Chief Complaint  Patient presents with  . Headache  . Urinary Tract Infection    HPI [name] is a 52 y.o. female history of migraines, depression, anemia, celiac, presenting today for evaluation of headache and possible UTI.  Patient states that she has had a headache that began last night.  Began with spots/aura.  Has a throbbing sensation all over her head.  States that this is a typical migraine and feels similar to what she has presented with in the past.  She has tried Excedrin, ibuprofen, Goody powders without relief.  She has associated nausea, denies vomiting.  Denies vision changes besides spots.  Has associated photophobia and phonophobia.  Denies weakness or difficulty speaking.  States that she typically has improvement with injections.  She has seen neurology in the past with negative MRI.  She has plans to follow-up with PCP to discuss preventative measures including monthly injection.  Patient has also noticed over the past few days to develop an odor with urination, vaginal itching and dark urine.  She has had occasional dysuria.  Has held urgency with incomplete voiding.  Has a history of UTIs.  Denies history of yeast or BV.  Denies abnormal vaginal discharge.  Denies any new partners or concern for STDs.  HPI  Past Medical History:  Diagnosis Date  . Anemia   . Celiac disease   . Elevated blood pressure reading   . Family history of adverse reaction to anesthesia    MOM-HARD TIME WAKING UP  . GERD (gastroesophageal reflux disease)   . Migraine with visual aura    MIGRAINES  . UTI (lower urinary tract infection)     Patient Active Problem List   Diagnosis Date Noted  . Post-operative state 07/25/2017  . Blood loss anemia 03/05/2017  . BV (bacterial vaginosis) 11/27/2012  . UTI (lower urinary tract infection) 11/27/2012  . Migraine with visual  aura 09/19/2011  . Depression 09/19/2011  . Constipation 06/25/2011    Past Surgical History:  Procedure Laterality Date  . BLADDER SUSPENSION    . EXPLORATORY LAPAROTOMY    . IUD REMOVAL  07/25/2017   Procedure: INTRAUTERINE DEVICE (IUD) REMOVAL;  Surgeon: Harlin Heys, MD;  Location: [facility] ORS;  Service: Gynecology;;  . LAPAROSCOPIC ASSISTED VAGINAL HYSTERECTOMY Bilateral 07/25/2017   Procedure: LAPAROSCOPIC ASSISTED VAGINAL HYSTERECTOMY WITH BILATERAL [place] OOPHERECTOMY;  Surgeon: Harlin Heys, MD;  Location: [facility] ORS;  Service: Gynecology;  Laterality: Bilateral;  . TUBAL LIGATION      OB History    Gravida  3   Para  3   Term  3   Preterm      AB      Living  3     SAB      TAB      Ectopic      Multiple      Live Births  3            Home Medications    Prior to Admission medications   Medication Sig Start Date End Date Taking? Authorizing Provider  aspirin-acetaminophen-caffeine (EXCEDRIN MIGRAINE) [phone] MG tablet Take by mouth every 6 (six) hours as needed for headache.    [provider]  buPROPion (WELLBUTRIN XL) 150 MG 24 hr tablet TAKE 1 TABLET BY MOUTH EVERY DAY 02/06/18   Forrest Moron, MD  estradiol (ESTRACE)  1 MG tablet TAKE 1 TABLET BY MOUTH EVERY DAY 11/20/17   Harlin Heys, MD  fluconazole (DIFLUCAN) 150 MG tablet Take 1 tablet (150 mg total) by mouth once for 1 dose. 02/21/18 02/21/18  Uchechukwu Dhawan C, PA-C  ibuprofen (ADVIL,MOTRIN) 600 MG tablet Take 1 tablet (600 mg total) by mouth every 6 (six) hours as needed. 06/25/17   Arta Silence, MD  omeprazole (PRILOSEC) 20 MG capsule Take 1 capsule (20 mg total) by mouth daily. 12/03/17   Forrest Moron, MD    Family History Family History  Problem Relation Age of Onset  . Diabetes Son 60       type 1    Social History Social History   Tobacco Use  . Smoking status: Never Smoker  . Smokeless tobacco: Never Used  Substance Use Topics  .  Alcohol use: No    Alcohol/week: 0.0 standard drinks  . Drug use: No     Allergies   Gluten meal and Ondansetron   Review of Systems Review of Systems  Constitutional: Negative for fatigue and fever.  HENT: Negative for congestion, sinus pressure and sore throat.   Eyes: Positive for photophobia. Negative for pain and visual disturbance.  Respiratory: Negative for cough and shortness of breath.   Cardiovascular: Negative for chest pain.  Gastrointestinal: Negative for abdominal pain, diarrhea, nausea and vomiting.  Genitourinary: Positive for dysuria, frequency and urgency. Negative for decreased urine volume, difficulty urinating, flank pain, genital sores, hematuria, menstrual problem, vaginal bleeding, vaginal discharge and vaginal pain.  Musculoskeletal: Negative for back pain, myalgias, neck pain and neck stiffness.  Skin: Negative for rash.  Neurological: Positive for headaches. Negative for dizziness, syncope, facial asymmetry, speech difficulty, weakness, light-headedness and numbness.     Physical Exam Triage Vital Signs ED Triage Vitals [02/21/18 1010]  Enc Vitals Group     BP (!) 148/97     Pulse Rate 95     Resp 20     Temp 98.2 F (36.8 C)     Temp Source Oral     SpO2 98 %     Weight      Height      Head Circumference      Peak Flow      Pain Score 7     Pain Loc      Pain Edu?      Excl. in [place]?    No data found.  Updated Vital Signs BP (!) 148/97 (BP Location: Right Arm)   Pulse 95   Temp 98.2 F (36.8 C) (Oral)   Resp 20   LMP  (LMP Unknown)   SpO2 98%   Visual Acuity Right Eye Distance:   Left Eye Distance:   Bilateral Distance:    Right Eye Near:   Left Eye Near:    Bilateral Near:     Physical Exam  Constitutional: She is oriented to person, place, and time. She appears well-developed and well-nourished. No distress.  HENT:  Head: Normocephalic and atraumatic.  Bilateral ears without tenderness to palpation of external auricle,  tragus and mastoid, EAC's without erythema or swelling, TM's with good bony landmarks and cone of light. Non erythematous.  Oral mucosa pink and moist, no tonsillar enlargement or exudate. Posterior pharynx patent and nonerythematous, no uvula deviation or swelling. Normal phonation.  Eyes: Pupils are equal, round, and reactive to light. Conjunctivae and EOM are normal.  Neck: Normal range of motion. Neck supple.  Full active range of motion  of neck  Cardiovascular: Normal rate and regular rhythm.  No murmur heard. Pulmonary/Chest: Effort normal and breath sounds normal. No respiratory distress.  Breathing comfortably at rest, CTABL, no wheezing, rales or other adventitious sounds auscultated  Abdominal: Soft. There is tenderness.  Mild bilateral lower quadrant tenderness, negative rebound, negative McBurney's, negative Rovsing  Musculoskeletal: She exhibits no edema.  Neurological: She is alert and oriented to person, place, and time.  Patient A&O x3, cranial nerves II-XII grossly intact, strength at shoulders, hips and knees 5/5, equal bilaterally, patellar reflex 2+ bilaterally.  Gait without abnormality.  Skin: Skin is warm and dry.  Psychiatric: She has a normal mood and affect.  Nursing note and vitals reviewed.    UC Treatments / Results  Labs (all labs ordered are listed, but only abnormal results are displayed) Labs Reviewed  POCT URINALYSIS DIP (DEVICE) - Abnormal; Notable for the following components:      Result Value   Hgb urine dipstick TRACE (*)    All other components within normal limits  CERVICOVAGINAL ANCILLARY ONLY    EKG None  Radiology No results found.  Procedures Procedures (including critical care time)  Medications Ordered in UC Medications  ketorolac (TORADOL) injection 60 mg (60 mg Intramuscular Given 02/21/18 1106)  metoCLOPramide (REGLAN) injection 5 mg (5 mg Intramuscular Given 02/21/18 1106)  dexamethasone (DECADRON) injection 10 mg (10 mg  Intramuscular Given 02/21/18 1106)    Initial Impression / Assessment and Plan / UC Course  I have reviewed the triage vital signs and the nursing notes.  Pertinent labs & imaging results that were available during my care of the patient were reviewed by me and considered in my medical decision making (see chart for details).     Negative leuks and nitrites, will explore vaginal causes of symptoms.  Possible yeast versus BV.  Swab obtained to confirm.  We will go ahead and empirically treat with Diflucan today.  Patient having typical headache, no red flags, vital signs stable.  Will treat with Decadron, Toradol and Reglan as this is helped in the past.  Continue Tylenol and ibuprofen or Aleve at home.  Follow-up with PCP to discuss prevention.  Discussed strict return precautions. Patient verbalized understanding and is agreeable with plan.  Final Clinical Impressions(s) / UC Diagnoses   Final diagnoses:  Migraine with aura and without status migrainosus, not intractable  Vaginal itching     Discharge Instructions     We gave you a shot of Decadron, Toradol and Reglan today.  They should begin working in approximately 30 to 40 minutes.  Please continue to take Tylenol and ibuprofen at home for further headache management Follow-up with primary care for discussion of prevention measures of headache  Your urine did not show signs of UTI, we will send the swab off to check for yeast and BV.  Please begin taking Diflucan, repeat in 2 to 3 days.  We will call you with the results of the swab and send in medicine for BV if needed.  Please monitor your headache, please go to emergency room if developing worse headache of life, headache persisting, developing vision changes, weakness, difficulty speaking   ED Prescriptions    Medication Sig Dispense Auth. Provider   fluconazole (DIFLUCAN) 150 MG tablet Take 1 tablet (150 mg total) by mouth once for 1 dose. 2 tablet Hurshell Dino C,  PA-C     Controlled Substance Prescriptions [place] Controlled Substance Registry consulted? Not Applicable   Ion Gonnella, [place] C, PA-C  02/21/18 1116  

## 2018-02-21 NOTE — ED Triage Notes (Signed)
Pt presents with ongoing headache and urinary tract symptoms; dark colored urine, foul smell and discomfort.

## 2018-02-21 NOTE — Discharge Instructions (Addendum)
We gave you a shot of Decadron, Toradol and Reglan today.  They should begin working in approximately 30 to 40 minutes.  Please continue to take Tylenol and ibuprofen at home for further headache management Follow-up with primary care for discussion of prevention measures of headache  Your urine did not show signs of UTI, we will send the swab off to check for yeast and BV.  Please begin taking Diflucan, repeat in 2 to 3 days.  We will call you with the results of the swab and send in medicine for BV if needed.  Please monitor your headache, please go to emergency room if developing worse headache of life, headache persisting, developing vision changes, weakness, difficulty speaking

## 2018-02-22 LAB — CERVICOVAGINAL ANCILLARY ONLY
Bacterial vaginitis: NEGATIVE
Candida vaginitis: NEGATIVE

## 2018-03-18 ENCOUNTER — Other Ambulatory Visit: Payer: Self-pay

## 2018-03-18 ENCOUNTER — Encounter (HOSPITAL_COMMUNITY): Payer: Self-pay

## 2018-03-18 ENCOUNTER — Ambulatory Visit (HOSPITAL_COMMUNITY)
Admission: EM | Admit: 2018-03-18 | Discharge: 2018-03-18 | Disposition: A | Discharge status: Home / Self Care | Payer: PRIVATE HEALTH INSURANCE | Attending: Radiology | Admitting: Radiology

## 2018-03-18 DIAGNOSIS — G43809 Other migraine, not intractable, without status migrainosus: Secondary | ICD-10-CM

## 2018-03-18 MED ORDER — DIPHENHYDRAMINE HCL 25 MG PO CAPS
ORAL_CAPSULE | ORAL | Status: AC
  Filled 2018-03-18: qty 1

## 2018-03-18 MED ORDER — DEXAMETHASONE SODIUM PHOSPHATE 10 MG/ML IJ SOLN
10.0000 mg | Freq: Once | INTRAMUSCULAR | Status: AC
  Administered 2018-03-18: 10 mg via INTRAMUSCULAR

## 2018-03-18 MED ORDER — DEXAMETHASONE SODIUM PHOSPHATE 10 MG/ML IJ SOLN
INTRAMUSCULAR | Status: AC
  Filled 2018-03-18: qty 1

## 2018-03-18 MED ORDER — DIPHENHYDRAMINE HCL 25 MG PO CAPS
25.0000 mg | ORAL_CAPSULE | Freq: Once | ORAL | Status: AC
  Administered 2018-03-18: 25 mg via ORAL

## 2018-03-18 MED ORDER — KETOROLAC TROMETHAMINE 30 MG/ML IJ SOLN
30.0000 mg | Freq: Once | INTRAMUSCULAR | Status: AC
  Administered 2018-03-18: 30 mg via INTRAMUSCULAR

## 2018-03-18 MED ORDER — KETOROLAC TROMETHAMINE 30 MG/ML IJ SOLN
INTRAMUSCULAR | Status: AC
  Filled 2018-03-18: qty 1

## 2018-03-18 NOTE — ED Provider Notes (Signed)
[place]    CSN: [id number] Arrival date & time: 03/18/18  1328     History   Chief Complaint Chief Complaint  Patient presents with  . Migraine    HPI [name] is a 52 y.o. female.   52 year old female presents with signs and symptoms of a migraine x3 days.  Patient reports a history of migraines that has increased in regularity since her hysterectomy in April of this past year.  Patient states that she gets a migraine every 2 to 3 weeks.  Patient reporting nausea with light sensitivity and aura.  Patient denies vomiting or neurological deficits.  Patient is requesting a migraine cocktail without Zofran this time.  Patient has previously seen a neurologist however has not seen one recently.  Patient reports migraines are typical in nature reporting headache behind bilateral eyes radiating to posteriorly      Past Medical History:  Diagnosis Date  . Anemia   . Celiac disease   . Elevated blood pressure reading   . Family history of adverse reaction to anesthesia    MOM-HARD TIME WAKING UP  . GERD (gastroesophageal reflux disease)   . Migraine with visual aura    MIGRAINES  . UTI (lower urinary tract infection)     Patient Active Problem List   Diagnosis Date Noted  . Post-operative state 07/25/2017  . Blood loss anemia 03/05/2017  . BV (bacterial vaginosis) 11/27/2012  . UTI (lower urinary tract infection) 11/27/2012  . Migraine with visual aura 09/19/2011  . Depression 09/19/2011  . Constipation 06/25/2011    Past Surgical History:  Procedure Laterality Date  . BLADDER SUSPENSION    . EXPLORATORY LAPAROTOMY    . IUD REMOVAL  07/25/2017   Procedure: INTRAUTERINE DEVICE (IUD) REMOVAL;  Surgeon: Harlin Heys, MD;  Location: [facility] ORS;  Service: Gynecology;;  . LAPAROSCOPIC ASSISTED VAGINAL HYSTERECTOMY Bilateral 07/25/2017   Procedure: LAPAROSCOPIC ASSISTED VAGINAL HYSTERECTOMY WITH BILATERAL [place] OOPHERECTOMY;  Surgeon: Harlin Heys,  MD;  Location: [facility] ORS;  Service: Gynecology;  Laterality: Bilateral;  . TUBAL LIGATION      OB History    Gravida  3   Para  3   Term  3   Preterm      AB      Living  3     SAB      TAB      Ectopic      Multiple      Live Births  3            Home Medications    Prior to Admission medications   Medication Sig Start Date End Date Taking? Authorizing Provider  aspirin-acetaminophen-caffeine (EXCEDRIN MIGRAINE) [phone] MG tablet Take by mouth every 6 (six) hours as needed for headache.    [provider]  buPROPion (WELLBUTRIN XL) 150 MG 24 hr tablet TAKE 1 TABLET BY MOUTH EVERY DAY 02/06/18   Forrest Moron, MD  estradiol (ESTRACE) 1 MG tablet TAKE 1 TABLET BY MOUTH EVERY DAY 11/20/17   Harlin Heys, MD  ibuprofen (ADVIL,MOTRIN) 600 MG tablet Take 1 tablet (600 mg total) by mouth every 6 (six) hours as needed. 06/25/17   Arta Silence, MD  omeprazole (PRILOSEC) 20 MG capsule Take 1 capsule (20 mg total) by mouth daily. 12/03/17   Forrest Moron, MD    Family History Family History  Problem Relation Age of Onset  . Diabetes Son 12       type 1  Social History Social History   Tobacco Use  . Smoking status: Never Smoker  . Smokeless tobacco: Never Used  Substance Use Topics  . Alcohol use: No    Alcohol/week: 0.0 standard drinks  . Drug use: No     Allergies   Gluten meal and Ondansetron   Review of Systems Review of Systems  Constitutional: Negative for chills and fever.  HENT: Negative for ear pain and sore throat.   Eyes: Negative for pain and visual disturbance.  Respiratory: Negative for cough and shortness of breath.   Cardiovascular: Negative for chest pain and palpitations.  Gastrointestinal: Positive for nausea. Negative for abdominal pain and vomiting.  Genitourinary: Negative for dysuria and hematuria.  Musculoskeletal: Negative for arthralgias and back pain.  Skin: Negative for color change and rash.   Neurological: Positive for headaches. Negative for seizures and syncope.  All other systems reviewed and are negative.    Physical Exam Triage Vital Signs ED Triage Vitals  Enc Vitals Group     BP 03/18/18 1449 (!) 176/119     Pulse Rate 03/18/18 1449 80     Resp 03/18/18 1449 18     Temp 03/18/18 1449 98 F (36.7 C)     Temp src --      SpO2 03/18/18 1449 100 %     Weight 03/18/18 1452 170 lb (77.1 kg)     Height --      Head Circumference --      Peak Flow --      Pain Score --      Pain Loc --      Pain Edu? --      Excl. in [place]? --    No data found.  Updated Vital Signs BP (!) 176/119 (BP Location: Right Arm)   Pulse 80   Temp 98 F (36.7 C)   Resp 18   Wt 170 lb (77.1 kg)   LMP  (LMP Unknown)   SpO2 100%   BMI 33.20 kg/m   Visual Acuity Right Eye Distance:   Left Eye Distance:   Bilateral Distance:    Right Eye Near:   Left Eye Near:    Bilateral Near:     Physical Exam  Constitutional: She is oriented to person, place, and time. She appears well-developed and well-nourished.  HENT:  Head: Normocephalic and atraumatic.  Eyes: Conjunctivae are normal.  Neck: Normal range of motion.  Cardiovascular: Normal rate and regular rhythm.  Pulmonary/Chest: Effort normal and breath sounds normal.  Musculoskeletal: Normal range of motion.  Neurological: She is alert and oriented to person, place, and time.  Skin: Skin is warm.  Psychiatric: She has a normal mood and affect.  Nursing note and vitals reviewed.    UC Treatments / Results  Labs (all labs ordered are listed, but only abnormal results are displayed) Labs Reviewed - No data to display  EKG None  Radiology No results found.  Procedures Procedures (including critical care time)  Medications Ordered in UC Medications  ketorolac (TORADOL) 30 MG/ML injection 30 mg (has no administration in time range)  dexamethasone (DECADRON) injection 10 mg (has no administration in time range)    diphenhydrAMINE (BENADRYL) capsule 25 mg (has no administration in time range)    Initial Impression / Assessment and Plan / UC Course  I have reviewed the triage vital signs and the nursing notes.  Pertinent labs & imaging results that were available during my care of the patient were reviewed by me  and considered in my medical decision making (see chart for details).    Patient reports improved condition after medication administration.    Final Clinical Impressions(s) / UC Diagnoses   Final diagnoses:  None   Discharge Instructions   None    ED Prescriptions    None     Controlled Substance Prescriptions [place] Controlled Substance Registry consulted? Not Applicable   Jacqualine Mau, NP 03/18/18 1545

## 2018-03-18 NOTE — ED Triage Notes (Signed)
Pt cc pt states she has a migraine x 3 days.

## 2018-05-03 ENCOUNTER — Encounter (HOSPITAL_COMMUNITY): Payer: Self-pay

## 2018-05-03 ENCOUNTER — Ambulatory Visit (HOSPITAL_COMMUNITY)
Admission: EM | Admit: 2018-05-03 | Discharge: 2018-05-03 | Disposition: A | Discharge status: Home / Self Care | Payer: BLUE CROSS/BLUE SHIELD | Attending: Family Medicine | Admitting: Family Medicine

## 2018-05-03 DIAGNOSIS — M79672 Pain in left foot: Secondary | ICD-10-CM | POA: Diagnosis not present

## 2018-05-03 DIAGNOSIS — G43019 Migraine without aura, intractable, without status migrainosus: Secondary | ICD-10-CM

## 2018-05-03 MED ORDER — KETOROLAC TROMETHAMINE 60 MG/2ML IM SOLN
60.0000 mg | Freq: Once | INTRAMUSCULAR | Status: AC
  Administered 2018-05-03: 60 mg via INTRAMUSCULAR

## 2018-05-03 MED ORDER — KETOROLAC TROMETHAMINE 60 MG/2ML IM SOLN
INTRAMUSCULAR | Status: AC
  Filled 2018-05-03: qty 2

## 2018-05-03 MED ORDER — PREDNISONE 20 MG PO TABS: ORAL_TABLET | ORAL | 0 refills | Status: DC

## 2018-05-03 MED ORDER — TOPIRAMATE 50 MG PO TABS: 50.0000 mg | ORAL_TABLET | Freq: Every day | ORAL | 5 refills | Status: DC

## 2018-05-03 NOTE — ED Triage Notes (Signed)
Pt presents with left foot pain not associated with any injury.  Pt also complains of an ongoing migraine that has been unrelieved with medication for a few days.

## 2018-05-03 NOTE — ED Provider Notes (Signed)
[place]    CSN: [id number] Arrival date & time: 05/03/18  0809     History   Chief Complaint Chief Complaint  Patient presents with  . Foot Pain  . Migraine    HPI [name] is a 53 y.o. female.   This is a 53 year old woman, established patient at [facility] urgent care, who presents with left foot pain and ongoing migraine.    The migraine is typical, beginning yesterday with visual aura, took ibuprofen initially and Excedrin migraine today.  Trouble sleeping last night, photophobia without diplopia.  The foot pain is 2-3 months, sharp in character, persistent.  No trauma, swelling, redness.  Worse with weight bearing.  Tender calcaneus.  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  Had used brace but it stopped working.     Past Medical History:  Diagnosis Date  . Anemia   . Celiac disease   . Elevated blood pressure reading   . Family history of adverse reaction to anesthesia    MOM-HARD TIME WAKING UP  . GERD (gastroesophageal reflux disease)   . Migraine with visual aura    MIGRAINES  . UTI (lower urinary tract infection)     Patient Active Problem List   Diagnosis Date Noted  . Constipation 06/25/2011    Priority: High  . Post-operative state 07/25/2017  . Blood loss anemia 03/05/2017  . BV (bacterial vaginosis) 11/27/2012  . UTI (lower urinary tract infection) 11/27/2012  . Migraine with visual aura 09/19/2011  . Depression 09/19/2011    Past Surgical History:  Procedure Laterality Date  . BLADDER SUSPENSION    . EXPLORATORY LAPAROTOMY    . IUD REMOVAL  07/25/2017   Procedure: INTRAUTERINE DEVICE (IUD) REMOVAL;  Surgeon: Harlin Heys, MD;  Location: [facility] ORS;  Service: Gynecology;;  . LAPAROSCOPIC ASSISTED VAGINAL HYSTERECTOMY Bilateral 07/25/2017   Procedure: LAPAROSCOPIC ASSISTED VAGINAL HYSTERECTOMY WITH BILATERAL [place] OOPHERECTOMY;  Surgeon: Harlin Heys, MD;  Location: [facility]  ORS;  Service: Gynecology;  Laterality: Bilateral;  . TUBAL LIGATION      OB History    Gravida  3   Para  3   Term  3   Preterm      AB      Living  3     SAB      TAB      Ectopic      Multiple      Live Births  3            Home Medications    Prior to Admission medications   Medication Sig Start Date End Date Taking? Authorizing Provider  aspirin-acetaminophen-caffeine (EXCEDRIN MIGRAINE) [phone] MG tablet Take by mouth every 6 (six) hours as needed for headache.    [provider]  buPROPion (WELLBUTRIN XL) 150 MG 24 hr tablet TAKE 1 TABLET BY MOUTH EVERY DAY 02/06/18   Forrest Moron, MD  estradiol (ESTRACE) 1 MG tablet TAKE 1 TABLET BY MOUTH EVERY DAY 11/20/17   Harlin Heys, MD  ibuprofen (ADVIL,MOTRIN) 600 MG tablet Take 1 tablet (600 mg total) by mouth every 6 (six) hours as needed. 06/25/17   Arta Silence, MD  omeprazole (PRILOSEC) 20 MG capsule Take 1 capsule (20 mg total) by mouth daily. 12/03/17   Forrest Moron, MD  predniSONE (DELTASONE) 20 MG tablet Two daily with food 05/03/18   Robyn Haber, MD  topiramate (TOPAMAX) 50 MG tablet Take 1  tablet (50 mg total) by mouth at bedtime. 05/03/18   Robyn Haber, MD    Family History Family History  Problem Relation Age of Onset  . Diabetes Son 33       type 1    Social History Social History   Tobacco Use  . Smoking status: Never Smoker  . Smokeless tobacco: Never Used  Substance Use Topics  . Alcohol use: No    Alcohol/week: 0.0 standard drinks  . Drug use: No     Allergies   Gluten meal and Ondansetron   Review of Systems Review of Systems  Constitutional: Negative for chills and fever.     Physical Exam Triage Vital Signs ED Triage Vitals  Enc Vitals Group     BP 05/03/18 0835 (!) 146/100     Pulse Rate 05/03/18 0835 84     Resp 05/03/18 0835 16     Temp 05/03/18 0835 98 F (36.7 C)     Temp Source 05/03/18 0835 Oral     SpO2 05/03/18  0835 98 %     Weight --      Height --      Head Circumference --      Peak Flow --      Pain Score 05/03/18 0842 7     Pain Loc --      Pain Edu? --      Excl. in [place]? --    No data found.  Updated Vital Signs BP (!) 146/100 (BP Location: Right Arm)   Pulse 84   Temp 98 F (36.7 C) (Oral)   Resp 16   LMP  (LMP Unknown)   SpO2 98%    Physical Exam Vitals signs and nursing note reviewed.  Constitutional:      Appearance: Normal appearance.  HENT:     Head: Normocephalic and atraumatic.  Eyes:     Extraocular Movements: Extraocular movements intact.     Pupils: Pupils are equal, round, and reactive to light.  Neck:     Musculoskeletal: Normal range of motion and neck supple.  Pulmonary:     Effort: Pulmonary effort is normal.  Musculoskeletal:        General: Swelling and tenderness present. No deformity or signs of injury.     Comments: Tenderness on heel and swelling on ball of left foot compared to right.  Warm to the touch.  Skin:    General: Skin is warm and dry.     Capillary Refill: Capillary refill takes less than 2 seconds.  Neurological:     General: No focal deficit present.     Mental Status: She is alert and oriented to person, place, and time.     Cranial Nerves: No cranial nerve deficit.     Comments: No drift  Psychiatric:        Mood and Affect: Mood normal.      UC Treatments / Results  Labs (all labs ordered are listed, but only abnormal results are displayed) Labs Reviewed - No data to display  EKG None  Radiology No results found.  Procedures Procedures (including critical care time)  Medications Ordered in UC Medications  ketorolac (TORADOL) injection 60 mg (60 mg Intramuscular Given 05/03/18 0933)    Initial Impression / Assessment and Plan / UC Course  I have reviewed the triage vital signs and the nursing notes.  Pertinent labs & imaging results that were available during my care of the patient were reviewed by me and  considered in my medical decision making (see chart for details).    Final Clinical Impressions(s) / UC Diagnoses   Final diagnoses:  Intractable migraine without aura and without status migrainosus     Discharge Instructions     Try using a Dr. Felicie Morn insert on the left shoe cutting out the area where the heel comes in contact with the shoe.  Consider using the preventive medicine that we prescribed for migraines.  Say hi to your mom    ED Prescriptions    Medication Sig Dispense Auth. Provider   topiramate (TOPAMAX) 50 MG tablet Take 1 tablet (50 mg total) by mouth at bedtime. 30 tablet Robyn Haber, MD   predniSONE (DELTASONE) 20 MG tablet Two daily with food 10 tablet Robyn Haber, MD     Controlled Substance Prescriptions [place] Controlled Substance Registry consulted? Not Applicable   Robyn Haber, MD 05/03/18 [phone]

## 2018-05-03 NOTE — Discharge Instructions (Signed)
Try using a Dr. Felicie Morn insert on the left shoe cutting out the area where the heel comes in contact with the shoe.  Consider using the preventive medicine that we prescribed for migraines.  Say hi to your mom

## 2018-05-18 ENCOUNTER — Ambulatory Visit (HOSPITAL_COMMUNITY)
Admission: EM | Admit: 2018-05-18 | Discharge: 2018-05-18 | Disposition: A | Discharge status: Home / Self Care | Payer: BLUE CROSS/BLUE SHIELD | Attending: Emergency Medicine | Admitting: Emergency Medicine

## 2018-05-18 ENCOUNTER — Encounter (HOSPITAL_COMMUNITY): Payer: Self-pay

## 2018-05-18 DIAGNOSIS — R42 Dizziness and giddiness: Secondary | ICD-10-CM

## 2018-05-18 DIAGNOSIS — T887XXA Unspecified adverse effect of drug or medicament, initial encounter: Secondary | ICD-10-CM

## 2018-05-18 NOTE — ED Triage Notes (Signed)
Pt presents with complaints of dizziness, being off balance and forgetfullness x 2 week since starting Topamax. Pt is ambulatory with a steady gait. Also complains of fatigue.

## 2018-05-18 NOTE — ED Provider Notes (Signed)
HPI  SUBJECTIVE:  [name] is a 53 y.o. female who presents with intermittent episodes of dizziness that lasts several seconds described as feeling off balance and as vertigo, fatigue, and "zoning out" starting shortly after being started on Topamax for migraine prevention.  States that she has 1-3 episodes of this dizziness per day.  She denies chest pain, shortness of breath, nausea, diaphoresis, palpitations, ear pain, tinnitus, recent viral illness.  No change in her headaches since the dizzy spells started.  She denies nasal congestion, rhinorrhea, sinus pain or pressure, cough.  She has never had symptoms like this before.  No other change in her medications.  She has not tried anything for this.  No alleviating factors.  Symptoms are not associated with turning her head, large positional changes.  No aggravating factors.  She has a past medical history of migraines.  No history of vertigo, MI, arrhythmia, stroke, aneurysm, diabetes, hypertension.  PMD: None.    Past Medical History:  Diagnosis Date  . Anemia   . Celiac disease   . Elevated blood pressure reading   . Family history of adverse reaction to anesthesia    MOM-HARD TIME WAKING UP  . GERD (gastroesophageal reflux disease)   . Migraine with visual aura    MIGRAINES  . UTI (lower urinary tract infection)     Past Surgical History:  Procedure Laterality Date  . BLADDER SUSPENSION    . EXPLORATORY LAPAROTOMY    . IUD REMOVAL  07/25/2017   Procedure: INTRAUTERINE DEVICE (IUD) REMOVAL;  Surgeon: Harlin Heys, MD;  Location: [facility] ORS;  Service: Gynecology;;  . LAPAROSCOPIC ASSISTED VAGINAL HYSTERECTOMY Bilateral 07/25/2017   Procedure: LAPAROSCOPIC ASSISTED VAGINAL HYSTERECTOMY WITH BILATERAL [place] OOPHERECTOMY;  Surgeon: Harlin Heys, MD;  Location: [facility] OR[facility];  Service: Gynecology;  Laterality: Bilateral;  . TUBAL LIGATION      Family History  Problem Relation Age of Onset  . Diabetes Son 81       type 1     Social History   Tobacco Use  . Smoking status: Never Smoker  . Smokeless tobacco: Never Used  Substance Use Topics  . Alcohol use: No    Alcohol/week: 0.0 standard drinks  . Drug use: No    No current facility-administered medications for this encounter.   Current Outpatient Medications:  .  aspirin-acetaminophen-caffeine (EXCEDRIN MIGRAINE) 250-250-65 MG tablet, Take by mouth every 6 (six) hours as needed for headache., Disp: , Rfl:  .  buPROPion (WELLBUTRIN XL) 150 MG 24 hr tablet, TAKE 1 TABLET BY MOUTH EVERY DAY, Disp: 90 tablet, Rfl: 1 .  estradiol (ESTRACE) 1 MG tablet, TAKE 1 TABLET BY MOUTH EVERY DAY, Disp: 30 tablet, Rfl: 0 .  ibuprofen (ADVIL,MOTRIN) 600 MG tablet, Take 1 tablet (600 mg total) by mouth every 6 (six) hours as needed., Disp: 30 tablet, Rfl: 0 .  omeprazole (PRILOSEC) 20 MG capsule, Take 1 capsule (20 mg total) by mouth daily., Disp: 30 capsule, Rfl: 5 .  predniSONE (DELTASONE) 20 MG tablet, Two daily with food, Disp: 10 tablet, Rfl: 0  Allergies  Allergen Reactions  . Gluten Meal Diarrhea and Nausea Only       . Ondansetron Other (See Comments)    Helps her nausea, but makes HA worse     ROS  As noted in HPI.   Physical Exam  BP (!) 152/102   Pulse 78   Temp 97.8 F (36.6 C)   Resp 18   LMP  (LMP Unknown)  SpO2 95%   Constitutional: Well developed, well nourished, no acute distress Eyes:  EOMI, conjunctiva normal bilaterally HENT: Normocephalic, atraumatic,mucus membranes moist Respiratory: Normal inspiratory effort Cardiovascular: Normal rate, regular rhythm, no murmurs, rubs, gallops.  No carotid bruit. GI: nondistended skin: No rash, skin intact Musculoskeletal: no deformities Neurologic: Alert & oriented x 3, cranial nerves III through XII intact, finger-nose, heel shin within normal limits.  Tandem gait steady.  Romberg negative.  Negative Dix-Hallpike bilaterally Psychiatric: Speech and behavior appropriate   ED  Course   Medications - No data to display  No orders of the defined types were placed in this encounter.   No results found for this or any previous visit (from the past 24 hour(s)). No results found.  ED Clinical Impression  Side effect of medication  Dizziness   ED Assessment/Plan  Presentation consistent with adverse effects from the Topamax.  No evidence of central cause of her symptoms, doubt cardiac arrhythmia.  Will have her stop Topamax to see how she does.  Will provide a primary care referral list, advised her that she most likely needs to be on a preventative migraine medication and that she needs ongoing care.  Discussed MDM, treatment plan, and plan for follow-up with patient. Discussed sn/sx that should prompt return to the ED. patient agrees with plan.   No orders of the defined types were placed in this encounter.   *This clinic note was created using Dragon dictation software. Therefore, there may be occasional mistakes despite careful proofreading.   ?   Melynda Ripple, MD 05/18/18 1747

## 2018-05-18 NOTE — Discharge Instructions (Addendum)
I suspect that this is a side effect from the Topamax.  Stop the Topamax, and see if you get better.  Follow-up with the primary care physician of your choice, see list below.  Below is a list of primary care practices who are taking new patients for you to follow-up with.  [facility] at [facility] [address] [place], [place] [address] [phone]  [place], [place] [address] [phone]  [facility] [phone] [place] [address]  [facility]: [place]  [phone]  [facility]: [place]   [phone]  [facility]: [address] [place]  [phone]  [facility] : [address] [phone]  [facility]: [address] [place] [address] [phone]  [facility]: [place] [phone]  Dr. Blanchie Serve [place]  [phone]  Dr. Benito Mccreedy, [facility]. [place], [place] [address]  [phone]  Go to www.goodrx.com to look up your medications. This will give you a list of where you can find your prescriptions at the most affordable prices. Or ask the pharmacist what the cash price is, or if they have any other discount programs available to help make your medication more affordable. This can be less expensive than what you would pay with insurance.

## 2018-06-02 ENCOUNTER — Ambulatory Visit (HOSPITAL_COMMUNITY)
Admission: EM | Admit: 2018-06-02 | Discharge: 2018-06-02 | Disposition: A | Discharge status: Home / Self Care | Payer: BLUE CROSS/BLUE SHIELD | Attending: Internal Medicine | Admitting: Internal Medicine

## 2018-06-02 ENCOUNTER — Encounter (HOSPITAL_COMMUNITY): Payer: Self-pay

## 2018-06-02 ENCOUNTER — Other Ambulatory Visit: Payer: Self-pay | Admitting: Family Medicine

## 2018-06-02 DIAGNOSIS — S0501XA Injury of conjunctiva and corneal abrasion without foreign body, right eye, initial encounter: Secondary | ICD-10-CM | POA: Diagnosis not present

## 2018-06-02 DIAGNOSIS — G43509 Persistent migraine aura without cerebral infarction, not intractable, without status migrainosus: Secondary | ICD-10-CM | POA: Diagnosis not present

## 2018-06-02 MED ORDER — KETOTIFEN FUMARATE 0.025 % OP SOLN: 1.0000 [drp] | Freq: Two times a day (BID) | OPHTHALMIC | 0 refills | Status: AC

## 2018-06-02 MED ORDER — CIPROFLOXACIN HCL 0.3 % OP SOLN: 1.0000 [drp] | OPHTHALMIC | 0 refills | Status: AC

## 2018-06-02 MED ORDER — PROPRANOLOL HCL 40 MG PO TABS: 40.0000 mg | ORAL_TABLET | Freq: Every day | ORAL | 0 refills | Status: DC

## 2018-06-02 NOTE — ED Provider Notes (Signed)
[facility]    CSN: [id number] Arrival date & time: 06/02/18  9937     History   Chief Complaint Chief Complaint  Patient presents with  . Eye Problem    HPI Noreen Mackintosh is a 53 y.o. female with a history of migraine presents to the urgent care for right eye pain following injury to the right eye when patient was playing with her grandson.  Grandson accidentally poked her eye.  Pain is severe, 10 out of 10.  Aggravated by bright light.  No relieving factors.  Is associated with redness of the right eye and tearing of the right eye.  Patient also complains of unilateral headache typical of her migraines.  She typically gets 2 or more episodes of migraines in a week.  She did not tolerate Topamax.   HPI  Past Medical History:  Diagnosis Date  . Anemia   . Celiac disease   . Elevated blood pressure reading   . Family history of adverse reaction to anesthesia    MOM-HARD TIME WAKING UP  . GERD (gastroesophageal reflux disease)   . Migraine with visual aura    MIGRAINES  . UTI (lower urinary tract infection)     Patient Active Problem List   Diagnosis Date Noted  . Post-operative state 07/25/2017  . Blood loss anemia 03/05/2017  . BV (bacterial vaginosis) 11/27/2012  . UTI (lower urinary tract infection) 11/27/2012  . Migraine with visual aura 09/19/2011  . Depression 09/19/2011  . Constipation 06/25/2011    Past Surgical History:  Procedure Laterality Date  . BLADDER SUSPENSION    . EXPLORATORY LAPAROTOMY    . IUD REMOVAL  07/25/2017   Procedure: INTRAUTERINE DEVICE (IUD) REMOVAL;  Surgeon: Harlin Heys, MD;  Location: [facility] OR[facility];  Service: Gynecology;;  . LAPAROSCOPIC ASSISTED VAGINAL HYSTERECTOMY Bilateral 07/25/2017   Procedure: LAPAROSCOPIC ASSISTED VAGINAL HYSTERECTOMY WITH BILATERAL [place] OOPHERECTOMY;  Surgeon: Harlin Heys, MD;  Location: [facility] ORS;  Service: Gynecology;  Laterality: Bilateral;  . TUBAL LIGATION      OB History    Gravida  3   Para  3   Term  3   Preterm      AB      Living  3     SAB      TAB      Ectopic      Multiple      Live Births  3            Home Medications    Prior to Admission medications   Medication Sig Start Date End Date Taking? Authorizing Provider  aspirin-acetaminophen-caffeine (EXCEDRIN MIGRAINE) [phone] MG tablet Take by mouth every 6 (six) hours as needed for headache.    [provider]  buPROPion (WELLBUTRIN XL) 150 MG 24 hr tablet TAKE 1 TABLET BY MOUTH EVERY DAY 02/06/18   Delia Chimes A, MD  ciprofloxacin (CILOXAN) 0.3 % ophthalmic solution Place 1 drop into the right eye every 4 (four) hours while awake for 5 days. Administer 1 drop, every 2 hours, while awake, for 2 days. Then 1 drop, every 4 hours, while awake, for the next 5 days. 06/02/18 06/07/18  Chase Picket, MD  estradiol (ESTRACE) 1 MG tablet TAKE 1 TABLET BY MOUTH EVERY DAY 11/20/17   Harlin Heys, MD  ibuprofen (ADVIL,MOTRIN) 600 MG tablet Take 1 tablet (600 mg total) by mouth every 6 (six) hours as needed. 06/25/17   Arta Silence, MD  ketotifen (ZADITOR) 0.025 %  ophthalmic solution Place 1 drop into the right eye 2 (two) times daily for 3 days. 06/02/18 06/05/18  Chase Picket, MD  omeprazole (PRILOSEC) 20 MG capsule TAKE 1 CAPSULE BY MOUTH EVERY DAY 06/02/18   Forrest Moron, MD  predniSONE (DELTASONE) 20 MG tablet Two daily with food 05/03/18   Robyn Haber, MD  propranolol (INDERAL) 40 MG tablet Take 1 tablet (40 mg total) by mouth daily for 30 days. 06/02/18 07/02/18  Chase Picket, MD    Family History Family History  Problem Relation Age of Onset  . Diabetes Son 42       type 1    Social History Social History   Tobacco Use  . Smoking status: Never Smoker  . Smokeless tobacco: Never Used  Substance Use Topics  . Alcohol use: No    Alcohol/week: 0.0 standard drinks  . Drug use: No     Allergies   Gluten meal and  Ondansetron   Review of Systems Review of Systems  Constitutional: Positive for activity change. Negative for chills, fatigue and fever.  HENT: Positive for ear discharge and ear pain. Negative for congestion, facial swelling, sinus pressure, sinus pain and sore throat.   Eyes: Positive for photophobia, pain, discharge, redness and visual disturbance. Negative for itching.  Gastrointestinal: Negative for abdominal distention and abdominal pain.  Neurological: Positive for headaches. Negative for dizziness, syncope, weakness and numbness.  Psychiatric/Behavioral: Negative for confusion and decreased concentration.     Physical Exam Triage Vital Signs ED Triage Vitals  Enc Vitals Group     BP 06/02/18 0827 (!) 139/96     Pulse Rate 06/02/18 0827 84     Resp 06/02/18 0827 18     Temp 06/02/18 0827 97.7 F (36.5 C)     Temp Source 06/02/18 0827 Oral     SpO2 06/02/18 0827 99 %     Weight --      Height --      Head Circumference --      Peak Flow --      Pain Score 06/02/18 0828 10     Pain Loc --      Pain Edu? --      Excl. in [place]? --    No data found.  Updated Vital Signs BP (!) 139/96 (BP Location: Left Arm)   Pulse 84   Temp 97.7 F (36.5 C) (Oral)   Resp 18   LMP  (LMP Unknown)   SpO2 99%   Visual Acuity Right Eye Distance: 20/40 Corrected  Left Eye Distance: 20/40 corrected  Bilateral Distance:    Right Eye Near:   Left Eye Near:    Bilateral Near:     Physical Exam Constitutional:      General: She is in acute distress.     Appearance: She is not ill-appearing.  HENT:     Nose: Nose normal. No congestion or rhinorrhea.     Mouth/Throat:     Mouth: Mucous membranes are moist.  Eyes:     General:        Right eye: Discharge present.     Comments: Right corneal abrasion in the right upper quadrant.  Conjunctiva looks injected.  No foreign bodies noted.  Abrasion is about 2 mm in the longest diameter  Neck:     Musculoskeletal: Normal range of motion.   Cardiovascular:     Rate and Rhythm: Normal rate and regular rhythm.     Pulses: Normal pulses.  Heart sounds: Normal heart sounds.  Pulmonary:     Effort: Pulmonary effort is normal.     Breath sounds: Normal breath sounds.  Abdominal:     General: Bowel sounds are normal. There is no distension.     Palpations: Abdomen is soft.     Tenderness: There is no abdominal tenderness.  Musculoskeletal: Normal range of motion.  Skin:    General: Skin is warm.     Capillary Refill: Capillary refill takes less than 2 seconds.  Neurological:     Mental Status: She is alert.      UC Treatments / Results  Labs (all labs ordered are listed, but only abnormal results are displayed) Labs Reviewed - No data to display  EKG None  Radiology No results found.  Procedures Procedures (including critical care time)  Medications Ordered in UC Medications - No data to display  Initial Impression / Assessment and Plan / UC Course  I have reviewed the triage vital signs and the nursing notes.  Pertinent labs & imaging results that were available during my care of the patient were reviewed by me and considered in my medical decision making (see chart for details).     1.  Corneal abrasion: Ciprofloxacin eyedrops Ketotifen eyedrops No indication for eye patch  2.  Recurrent migraine: Continue using NSAIDs as needed Patient did not tolerate Topamax We will start patient on propranolol 40 mg orally daily Patient's blood pressure can tolerate propranolol.  Final Clinical Impressions(s) / UC Diagnoses   Final diagnoses:  Abrasion of right cornea, initial encounter  Persistent migraine aura without cerebral infarction and without status migrainosus, not intractable   Discharge Instructions   None    ED Prescriptions    Medication Sig Dispense Auth. Provider   ciprofloxacin (CILOXAN) 0.3 % ophthalmic solution Place 1 drop into the right eye every 4 (four) hours while awake for  5 days. Administer 1 drop, every 2 hours, while awake, for 2 days. Then 1 drop, every 4 hours, while awake, for the next 5 days. 2.5 mL Aharon Carriere, Myrene Galas, MD   ketotifen (ZADITOR) 0.025 % ophthalmic solution Place 1 drop into the right eye 2 (two) times daily for 3 days. 5 mL Malacki Mcphearson, Myrene Galas, MD   propranolol (INDERAL) 40 MG tablet Take 1 tablet (40 mg total) by mouth daily for 30 days. 30 tablet Izella Ybanez, Myrene Galas, MD     Controlled Substance Prescriptions [place] Controlled Substance Registry consulted? No   Chase Picket, MD 06/02/18 [phone]

## 2018-06-02 NOTE — ED Triage Notes (Signed)
Pt present right eye problem, pt states she was playing with her grandson and he made a mistake and scratch her in the right eye. Pt eye is watery and sore.

## 2018-06-14 ENCOUNTER — Encounter: Payer: Self-pay | Admitting: Family Medicine

## 2018-06-15 ENCOUNTER — Encounter (HOSPITAL_COMMUNITY): Payer: Self-pay | Admitting: Emergency Medicine

## 2018-06-15 ENCOUNTER — Ambulatory Visit (HOSPITAL_COMMUNITY)
Admission: EM | Admit: 2018-06-15 | Discharge: 2018-06-15 | Disposition: A | Discharge status: Home / Self Care | Payer: BLUE CROSS/BLUE SHIELD | Attending: Family Medicine | Admitting: Family Medicine

## 2018-06-15 ENCOUNTER — Other Ambulatory Visit: Payer: Self-pay

## 2018-06-15 DIAGNOSIS — G43019 Migraine without aura, intractable, without status migrainosus: Secondary | ICD-10-CM | POA: Diagnosis not present

## 2018-06-15 MED ORDER — DEXAMETHASONE SODIUM PHOSPHATE 10 MG/ML IJ SOLN
10.0000 mg | Freq: Once | INTRAMUSCULAR | Status: AC
  Administered 2018-06-15: 10 mg via INTRAMUSCULAR

## 2018-06-15 MED ORDER — METOCLOPRAMIDE HCL 5 MG/ML IJ SOLN
INTRAMUSCULAR | Status: AC
  Filled 2018-06-15: qty 2

## 2018-06-15 MED ORDER — DEXAMETHASONE SODIUM PHOSPHATE 10 MG/ML IJ SOLN
INTRAMUSCULAR | Status: AC
  Filled 2018-06-15: qty 1

## 2018-06-15 MED ORDER — KETOROLAC TROMETHAMINE 60 MG/2ML IM SOLN
INTRAMUSCULAR | Status: AC
  Filled 2018-06-15: qty 2

## 2018-06-15 MED ORDER — METOCLOPRAMIDE HCL 5 MG/ML IJ SOLN
5.0000 mg | Freq: Once | INTRAMUSCULAR | Status: AC
  Administered 2018-06-15: 5 mg via INTRAMUSCULAR

## 2018-06-15 MED ORDER — KETOROLAC TROMETHAMINE 60 MG/2ML IM SOLN
60.0000 mg | Freq: Once | INTRAMUSCULAR | Status: AC
  Administered 2018-06-15: 60 mg via INTRAMUSCULAR

## 2018-06-15 NOTE — ED Triage Notes (Signed)
PT reports migraine symptoms for 36 hours. History of migraines. PT was prescribed propanolol from a visit here last month. PT stopped taking it two days ago due to lightheadedness. This is when migraine started.

## 2018-06-15 NOTE — ED Provider Notes (Signed)
[facility]   [id number] 06/15/18 Arrival Time: 2426  ASSESSMENT & PLAN:  1. Intractable migraine without aura and without status migrainosus    Meds ordered this encounter  Medications  . ketorolac (TORADOL) injection 60 mg  . metoCLOPramide (REGLAN) injection 5 mg  . dexamethasone (DECADRON) injection 10 mg   Normal neurological exam. Discussed. Current presentation and symptoms are consistent with prior migraines and are not consistent with SAH, ICH, meningitis, or temporal arteritis. Without fever, focal neurological deficits, nuchal rigidity, or change in vision. No indication for neurodiagnostic workup at this time. Discussed.  Follow-up Information    [facility].   Specialty:  Emergency Medicine Why:  If symptoms worsen in any way. Contact information: [address] [id number] [place] [phone]         May f/u here as needed. Reviewed expectations re: course of current medical issues. Questions answered. Outlined signs and symptoms indicating need for more acute intervention. Patient verbalized understanding. After Visit Summary given.   SUBJECTIVE:  [name] is a 53 y.o. female who presents with complaint of a migraine headache. Reports gradual onset 1-2 days ago. Location: occipital, temporal without radiation. History of headaches: yes with similar symptoms. Precipitating factors include: none which have been determined. Associated symptoms: Preceding aura: no. Nausea/vomiting: no. Vision changes: no. Increased sensitivity to light and to noises: mild. Fever: no. Sinus pressure/congestion: no. Extremity weakness: no. Home treatment has included acetaminophen with little improvement. Current headache has not limited normal daily activities. Denies dizziness, loss of balance, muscle weakness, numbness of extremities and speech difficulties. No head injury reported.  Ambulatory without difficulty. This is not the worst headache of her life.  ROS: As per HPI. All other systems negative.    OBJECTIVE:  Vitals:   06/15/18 0912  BP: 126/90  Pulse: 90  Resp: 16  Temp: 98.4 F (36.9 C)  TempSrc: Oral  SpO2: 97%    General appearance: alert; no distress but appears fatigued; prefers darkened room Eyes: PERRLA; EOMI; conjunctiva normal HENT: normocephalic; atraumatic Neck: supple with FROM Lungs: clear to auscultation bilaterally Heart: regular rate and rhythm Extremities: no edema; symmetrical with no gross deformities Skin: warm and dry Neurologic: CN 2-12 grossly intact; normal gait; normal symmetric reflexes; normal extremity strength and sensation throughout Psychological: alert and cooperative; normal mood and affect   Allergies  Allergen Reactions  . Gluten Meal Diarrhea and Nausea Only       . Ondansetron Other (See Comments)    Helps her nausea, but makes HA worse    Past Medical History:  Diagnosis Date  . Anemia   . Celiac disease   . Elevated blood pressure reading   . Family history of adverse reaction to anesthesia    MOM-HARD TIME WAKING UP  . GERD (gastroesophageal reflux disease)   . Migraine with visual aura    MIGRAINES  . UTI (lower urinary tract infection)    Social History   Socioeconomic History  . Marital status: Married    Spouse name: Jenny Reichmann  . Number of children: 3  . Years of education: 46  . Highest education level: Not on file  Occupational History  . Occupation: Personnel officer  Social Needs  . Financial resource strain: Not on file  . Food insecurity:    Worry: Not on file    Inability: Not on file  . Transportation needs:    Medical: Not on file  Non-medical: Not on file  Tobacco Use  . Smoking status: Never Smoker  . Smokeless tobacco: Never Used  Substance and Sexual Activity  . Alcohol use: No    Alcohol/week: 0.0 standard drinks  . Drug use: No  . Sexual  activity: Yes    Partners: Female    Birth control/protection: Surgical    Comment: INTERCOURSE AGE 35, SEXUAL PARTNERS LEES THAN 6  Lifestyle  . Physical activity:    Days per week: Not on file    Minutes per session: Not on file  . Stress: Not on file  Relationships  . Social connections:    Talks on phone: Not on file    Gets together: Not on file    Attends religious service: Not on file    Active member of club or organization: Not on file    Attends meetings of clubs or organizations: Not on file    Relationship status: Not on file  . Intimate partner violence:    Fear of current or ex partner: Not on file    Emotionally abused: Not on file    Physically abused: Not on file    Forced sexual activity: Not on file  Other Topics Concern  . Not on file  Social History Narrative   Lives with her husband and their three children, and her daughter's boyfriend.  Her older son's daughter lives there part-time as well.   Family History  Problem Relation Age of Onset  . Diabetes Son 12       type 1   Past Surgical History:  Procedure Laterality Date  . BLADDER SUSPENSION    . EXPLORATORY LAPAROTOMY    . IUD REMOVAL  07/25/2017   Procedure: INTRAUTERINE DEVICE (IUD) REMOVAL;  Surgeon: Harlin Heys, MD;  Location: [facility] ORS;  Service: Gynecology;;  . LAPAROSCOPIC ASSISTED VAGINAL HYSTERECTOMY Bilateral 07/25/2017   Procedure: LAPAROSCOPIC ASSISTED VAGINAL HYSTERECTOMY WITH BILATERAL [place] OOPHERECTOMY;  Surgeon: Harlin Heys, MD;  Location: [facility] ORS;  Service: Gynecology;  Laterality: Bilateral;  . TUBAL LIGATION       Vanessa Kick, MD 06/15/18 [phone]

## 2018-06-28 ENCOUNTER — Encounter (HOSPITAL_COMMUNITY): Payer: Self-pay

## 2018-06-28 ENCOUNTER — Other Ambulatory Visit: Payer: Self-pay

## 2018-06-28 ENCOUNTER — Ambulatory Visit (HOSPITAL_COMMUNITY)
Admission: EM | Admit: 2018-06-28 | Discharge: 2018-06-28 | Disposition: A | Discharge status: Home / Self Care | Payer: BLUE CROSS/BLUE SHIELD | Attending: Family Medicine | Admitting: Family Medicine

## 2018-06-28 DIAGNOSIS — R030 Elevated blood-pressure reading, without diagnosis of hypertension: Secondary | ICD-10-CM | POA: Diagnosis not present

## 2018-06-28 DIAGNOSIS — G43119 Migraine with aura, intractable, without status migrainosus: Secondary | ICD-10-CM | POA: Diagnosis not present

## 2018-06-28 MED ORDER — FLUTICASONE PROPIONATE 50 MCG/ACT NA SUSP: 2.0000 | Freq: Every day | NASAL | 0 refills | Status: DC

## 2018-06-28 MED ORDER — METOCLOPRAMIDE HCL 5 MG/ML IJ SOLN
INTRAMUSCULAR | Status: AC
  Filled 2018-06-28: qty 2

## 2018-06-28 MED ORDER — KETOROLAC TROMETHAMINE 30 MG/ML IJ SOLN
30.0000 mg | Freq: Once | INTRAMUSCULAR | Status: AC
  Administered 2018-06-28: 30 mg via INTRAMUSCULAR

## 2018-06-28 MED ORDER — DEXAMETHASONE SODIUM PHOSPHATE 10 MG/ML IJ SOLN
10.0000 mg | Freq: Once | INTRAMUSCULAR | Status: AC
  Administered 2018-06-28: 10 mg via INTRAMUSCULAR

## 2018-06-28 MED ORDER — DEXAMETHASONE SODIUM PHOSPHATE 10 MG/ML IJ SOLN
INTRAMUSCULAR | Status: AC
  Filled 2018-06-28: qty 1

## 2018-06-28 MED ORDER — CETIRIZINE HCL 10 MG PO TABS: 10.0000 mg | ORAL_TABLET | Freq: Every day | ORAL | 0 refills | Status: DC

## 2018-06-28 MED ORDER — KETOROLAC TROMETHAMINE 30 MG/ML IJ SOLN
INTRAMUSCULAR | Status: AC
  Filled 2018-06-28: qty 1

## 2018-06-28 MED ORDER — METOCLOPRAMIDE HCL 5 MG/ML IJ SOLN
5.0000 mg | Freq: Once | INTRAMUSCULAR | Status: AC
  Administered 2018-06-28: 5 mg via INTRAMUSCULAR

## 2018-06-28 NOTE — ED Provider Notes (Signed)
[name]    CSN: [id number] Arrival date & time: 06/28/18  1006     History   Chief Complaint Chief Complaint  Patient presents with  . Migraine    HPI [name] is a 53 y.o. female.   53 year old female with history of migraines comes in for migraine/frontal headache for the past 3 days. She has had frequent migraine episodes without obvious triggers. She has a PCP appointment at the end of the month for establishment and further evaluation. She has been put on Topamax and propranolol in the past, but did not tolerated them, states makes her feel "in the cloud" and unable to drive. Current episode started 3 days ago. States more frontal and to the crown with pressure sensation. She gets ocular aura prior to symptoms start. She has nausea without vomiting. Has had photophobia, phonophobia. States also with pressure behind the eyes. She denies URI symptoms such as cough, congestion, sore throat. Denies fever, chills, night sweats. Does work around dust, but has not noticed it to be a direct trigger. Does have mild sneezing. Never smoker.      Past Medical History:  Diagnosis Date  . Anemia   . Celiac disease   . Elevated blood pressure reading   . Family history of adverse reaction to anesthesia    MOM-HARD TIME WAKING UP  . GERD (gastroesophageal reflux disease)   . Migraine with visual aura    MIGRAINES  . UTI (lower urinary tract infection)     Patient Active Problem List   Diagnosis Date Noted  . Post-operative state 07/25/2017  . Blood loss anemia 03/05/2017  . BV (bacterial vaginosis) 11/27/2012  . UTI (lower urinary tract infection) 11/27/2012  . Migraine with visual aura 09/19/2011  . Depression 09/19/2011  . Constipation 06/25/2011    Past Surgical History:  Procedure Laterality Date  . BLADDER SUSPENSION    . EXPLORATORY LAPAROTOMY    . IUD REMOVAL  07/25/2017   Procedure: INTRAUTERINE DEVICE (IUD) REMOVAL;  Surgeon: Harlin Heys,  MD;  Location: [facility] ORS;  Service: Gynecology;;  . LAPAROSCOPIC ASSISTED VAGINAL HYSTERECTOMY Bilateral 07/25/2017   Procedure: LAPAROSCOPIC ASSISTED VAGINAL HYSTERECTOMY WITH BILATERAL [place] OOPHERECTOMY;  Surgeon: Harlin Heys, MD;  Location: [facility] ORS;  Service: Gynecology;  Laterality: Bilateral;  . TUBAL LIGATION      OB History    Gravida  3   Para  3   Term  3   Preterm      AB      Living  3     SAB      TAB      Ectopic      Multiple      Live Births  3            Home Medications    Prior to Admission medications   Medication Sig Start Date End Date Taking? Authorizing Provider  aspirin-acetaminophen-caffeine (EXCEDRIN MIGRAINE) [phone] MG tablet Take by mouth every 6 (six) hours as needed for headache.    [provider]  buPROPion (WELLBUTRIN XL) 150 MG 24 hr tablet TAKE 1 TABLET BY MOUTH EVERY DAY 02/06/18   Delia Chimes A, MD  cetirizine (ZYRTEC) 10 MG tablet Take 1 tablet (10 mg total) by mouth daily. 06/28/18   Ok Edwards, PA-C  estradiol (ESTRACE) 1 MG tablet TAKE 1 TABLET BY MOUTH EVERY DAY 11/20/17   Harlin Heys, MD  fluticasone [facility]) 50 MCG/ACT nasal spray Place 2  sprays into both nostrils daily. 06/28/18   Tasia Catchings, Sonja Manseau V, PA-C  ibuprofen (ADVIL,MOTRIN) 600 MG tablet Take 1 tablet (600 mg total) by mouth every 6 (six) hours as needed. 06/25/17   Arta Silence, MD  omeprazole (PRILOSEC) 20 MG capsule TAKE 1 CAPSULE BY MOUTH EVERY DAY 06/02/18   Forrest Moron, MD  predniSONE (DELTASONE) 20 MG tablet Two daily with food 05/03/18   Robyn Haber, MD  propranolol (INDERAL) 40 MG tablet Take 1 tablet (40 mg total) by mouth daily for 30 days. 06/02/18 07/02/18  Chase Picket, MD    Family History Family History  Problem Relation Age of Onset  . Diabetes Son 2       type 1    Social History Social History   Tobacco Use  . Smoking status: Never Smoker  . Smokeless tobacco: Never Used  Substance Use Topics  .  Alcohol use: No    Alcohol/week: 0.0 standard drinks  . Drug use: No     Allergies   Gluten meal and Ondansetron   Review of Systems Review of Systems  Reason unable to perform ROS: See HPI as above.     Physical Exam Triage Vital Signs ED Triage Vitals  Enc Vitals Group     BP 06/28/18 1102 (!) 143/90     Pulse Rate 06/28/18 1102 84     Resp 06/28/18 1102 18     Temp 06/28/18 1102 98.4 F (36.9 C)     Temp Source 06/28/18 1102 Tympanic     SpO2 06/28/18 1102 98 %     Weight 06/28/18 1103 156 lb (70.8 kg)     Height --      Head Circumference --      Peak Flow --      Pain Score 06/28/18 1102 9     Pain Loc --      Pain Edu? --      Excl. in [place]? --    No data found.  Updated Vital Signs BP (!) 143/90 (BP Location: Right Arm)   Pulse 84   Temp 98.4 F (36.9 C) (Tympanic)   Resp 18   Wt 156 lb (70.8 kg)   LMP  (LMP Unknown)   SpO2 98%   BMI 30.47 kg/m   Visual Acuity Right Eye Distance:   Left Eye Distance:   Bilateral Distance:    Right Eye Near:   Left Eye Near:    Bilateral Near:     Physical Exam Constitutional:      General: She is not in acute distress.    Appearance: She is well-developed. She is not ill-appearing, toxic-appearing or diaphoretic.  HENT:     Head: Normocephalic and atraumatic.     Right Ear: Tympanic membrane, ear canal and external ear normal. Tympanic membrane is not erythematous or bulging.     Left Ear: Tympanic membrane, ear canal and external ear normal. Tympanic membrane is not erythematous or bulging.     Nose:     Right Sinus: Maxillary sinus tenderness present. No frontal sinus tenderness.     Left Sinus: Maxillary sinus tenderness present. No frontal sinus tenderness.     Mouth/Throat:     Mouth: Mucous membranes are moist.     Pharynx: Oropharynx is clear. Uvula midline.  Eyes:     Extraocular Movements: Extraocular movements intact.     Conjunctiva/sclera: Conjunctivae normal.     Pupils: Pupils are equal,  round, and reactive to light.  Comments: Photophobia on exam.   Neck:     Musculoskeletal: Normal range of motion and neck supple.  Cardiovascular:     Rate and Rhythm: Normal rate and regular rhythm.     Heart sounds: Normal heart sounds. No murmur. No friction rub. No gallop.   Pulmonary:     Effort: Pulmonary effort is normal. No accessory muscle usage, prolonged expiration, respiratory distress or retractions.     Breath sounds: Normal breath sounds. No stridor, decreased air movement or transmitted upper airway sounds. No decreased breath sounds, wheezing, rhonchi or rales.  Skin:    General: Skin is warm and dry.  Neurological:     General: No focal deficit present.     Mental Status: She is alert and oriented to person, place, and time.     GCS: GCS eye subscore is 4. GCS verbal subscore is 5. GCS motor subscore is 6.     Cranial Nerves: Cranial nerves are intact.     Sensory: Sensation is intact.     Motor: Motor function is intact.     Coordination: Coordination is intact.     Gait: Gait is intact.      UC Treatments / Results  Labs (all labs ordered are listed, but only abnormal results are displayed) Labs Reviewed - No data to display  EKG None  Radiology No results found.  Procedures Procedures (including critical care time)  Medications Ordered in UC Medications  metoCLOPramide (REGLAN) injection 5 mg (has no administration in time range)  dexamethasone (DECADRON) injection 10 mg (has no administration in time range)  ketorolac (TORADOL) 30 MG/ML injection 30 mg (has no administration in time range)    Initial Impression / Assessment and Plan / UC Course  I have reviewed the triage vital signs and the nursing notes.  Pertinent labs & imaging results that were available during my care of the patient were reviewed by me and considered in my medical decision making (see chart for details).    Will provide migraine treatment with Toradol, Reglan,  Decadron injection in office today.  Discussed possible sinus pressure/allergies causing symptoms.  Start Zyrtec and Flonase as directed.  Return precautions given.  Otherwise follow-up with PCP as scheduled for further evaluation and management needed.  Patient expresses understanding and agrees to plan.  Final Clinical Impressions(s) / UC Diagnoses   Final diagnoses:  Intractable migraine with aura without status migrainosus    ED Prescriptions    Medication Sig Dispense Auth. Provider   fluticasone (FLONASE) 50 MCG/ACT nasal spray Place 2 sprays into both nostrils daily. 1 g Emmarose Klinke V, PA-C   cetirizine (ZYRTEC) 10 MG tablet Take 1 tablet (10 mg total) by mouth daily. 30 tablet Tobin Chad, [place] 06/28/18 1209

## 2018-06-28 NOTE — ED Triage Notes (Signed)
Pt cc migraine x 3 days. Pt states she has tried the OTC meds and they are not working.

## 2018-06-28 NOTE — Discharge Instructions (Signed)
Toradol, Reglan, Decadron injection in office today for migraine.  As discussed, start allergy medicine and Flonase to see if allergies may be a trigger to your migraines.  I have called in some Zyrtec, please take as directed.  Otherwise, follow-up with PCP as scheduled for further evaluation and management of frequent migraines.  If experiencing worsening symptoms, one-sided weakness, dizziness, confusion, fever, go to the emergency department for further evaluation.

## 2018-06-29 ENCOUNTER — Encounter (HOSPITAL_COMMUNITY): Payer: Self-pay

## 2018-06-29 ENCOUNTER — Emergency Department (HOSPITAL_COMMUNITY)
Admission: EM | Admit: 2018-06-29 | Discharge: 2018-06-29 | Disposition: A | Discharge status: Home / Self Care | Payer: BLUE CROSS/BLUE SHIELD | Attending: Emergency Medicine | Admitting: Emergency Medicine

## 2018-06-29 ENCOUNTER — Other Ambulatory Visit: Payer: Self-pay

## 2018-06-29 DIAGNOSIS — G43909 Migraine, unspecified, not intractable, without status migrainosus: Secondary | ICD-10-CM | POA: Diagnosis not present

## 2018-06-29 DIAGNOSIS — Z7982 Long term (current) use of aspirin: Secondary | ICD-10-CM | POA: Diagnosis not present

## 2018-06-29 DIAGNOSIS — Z79899 Other long term (current) drug therapy: Secondary | ICD-10-CM | POA: Diagnosis not present

## 2018-06-29 DIAGNOSIS — G43819 Other migraine, intractable, without status migrainosus: Secondary | ICD-10-CM

## 2018-06-29 MED ORDER — KETOROLAC TROMETHAMINE 10 MG PO TABS: 10.0000 mg | ORAL_TABLET | Freq: Four times a day (QID) | ORAL | 0 refills | Status: DC | PRN

## 2018-06-29 MED ORDER — PROMETHAZINE HCL 25 MG PO TABS
25.0000 mg | ORAL_TABLET | Freq: Once | ORAL | Status: AC
  Administered 2018-06-29: 25 mg via ORAL
  Filled 2018-06-29: qty 1

## 2018-06-29 MED ORDER — HYDROMORPHONE HCL 1 MG/ML IJ SOLN
1.0000 mg | Freq: Once | INTRAMUSCULAR | Status: AC
  Administered 2018-06-29: 1 mg via INTRAMUSCULAR
  Filled 2018-06-29: qty 1

## 2018-06-29 MED ORDER — PROMETHAZINE HCL 25 MG RE SUPP: 25.0000 mg | Freq: Four times a day (QID) | RECTAL | 0 refills | Status: DC | PRN

## 2018-06-29 NOTE — ED Triage Notes (Signed)
Pt complains of migraine x 4 days, went to [facility] yesterday and received shot of toradol which gave temporary relief then it came back. No neuro deficits. VSS

## 2018-06-29 NOTE — ED Provider Notes (Signed)
[facility] EMERGENCY DEPARTMENT Provider Note   CSN: [id number] Arrival date & time: 06/29/18  1227    History   Chief Complaint Chief Complaint  Patient presents with  . Migraine    HPI [name] is a 53 y.o. female.     Patient with longstanding history of migraine headaches presents with headache behind her eyes and radiating to the top of her head.  This is typical for her.  No stiff neck or neurological deficits.  She was given a Toradol shot yesterday which helped for a short period of time.  Severity is moderate.  Nothing makes symptoms better or worse.     Past Medical History:  Diagnosis Date  . Anemia   . Celiac disease   . Elevated blood pressure reading   . Family history of adverse reaction to anesthesia    MOM-HARD TIME WAKING UP  . GERD (gastroesophageal reflux disease)   . Migraine with visual aura    MIGRAINES  . UTI (lower urinary tract infection)     Patient Active Problem List   Diagnosis Date Noted  . Post-operative state 07/25/2017  . Blood loss anemia 03/05/2017  . BV (bacterial vaginosis) 11/27/2012  . UTI (lower urinary tract infection) 11/27/2012  . Migraine with visual aura 09/19/2011  . Depression 09/19/2011  . Constipation 06/25/2011    Past Surgical History:  Procedure Laterality Date  . BLADDER SUSPENSION    . EXPLORATORY LAPAROTOMY    . IUD REMOVAL  07/25/2017   Procedure: INTRAUTERINE DEVICE (IUD) REMOVAL;  Surgeon: Harlin Heys, MD;  Location: [facility] ORS;  Service: Gynecology;;  . LAPAROSCOPIC ASSISTED VAGINAL HYSTERECTOMY Bilateral 07/25/2017   Procedure: LAPAROSCOPIC ASSISTED VAGINAL HYSTERECTOMY WITH BILATERAL [place] OOPHERECTOMY;  Surgeon: Harlin Heys, MD;  Location: [facility] ORS;  Service: Gynecology;  Laterality: Bilateral;  . TUBAL LIGATION       OB History    Gravida  3   Para  3   Term  3   Preterm      AB      Living  3     SAB      TAB      Ectopic      Multiple     Live Births  3            Home Medications    Prior to Admission medications   Medication Sig Start Date End Date Taking? Authorizing Provider  aspirin-acetaminophen-caffeine (EXCEDRIN MIGRAINE) [phone] MG tablet Take by mouth every 6 (six) hours as needed for headache.    [provider]  buPROPion (WELLBUTRIN XL) 150 MG 24 hr tablet TAKE 1 TABLET BY MOUTH EVERY DAY 02/06/18   Delia Chimes A, MD  cetirizine (ZYRTEC) 10 MG tablet Take 1 tablet (10 mg total) by mouth daily. 06/28/18   Tasia Catchings, Amy V, PA-C  estradiol (ESTRACE) 1 MG tablet TAKE 1 TABLET BY MOUTH EVERY DAY 11/20/17   Harlin Heys, MD  fluticasone [facility]) 50 MCG/ACT nasal spray Place 2 sprays into both nostrils daily. 06/28/18   Tasia Catchings, Amy V, PA-C  ibuprofen (ADVIL,MOTRIN) 600 MG tablet Take 1 tablet (600 mg total) by mouth every 6 (six) hours as needed. 06/25/17   Arta Silence, MD  ketorolac (TORADOL) 10 MG tablet Take 1 tablet (10 mg total) by mouth every 6 (six) hours as needed. 06/29/18   Nat Christen, MD  omeprazole (PRILOSEC) 20 MG capsule TAKE 1 CAPSULE BY MOUTH EVERY DAY 06/02/18   Nolon Rod,  Zoe A, MD  predniSONE (DELTASONE) 20 MG tablet Two daily with food 05/03/18   Robyn Haber, MD  promethazine (PHENERGAN) 25 MG suppository Place 1 suppository (25 mg total) rectally every 6 (six) hours as needed for nausea or vomiting. 06/29/18   Nat Christen, MD  propranolol (INDERAL) 40 MG tablet Take 1 tablet (40 mg total) by mouth daily for 30 days. 06/02/18 07/02/18  Chase Picket, MD    Family History Family History  Problem Relation Age of Onset  . Diabetes Son 110       type 1    Social History Social History   Tobacco Use  . Smoking status: Never Smoker  . Smokeless tobacco: Never Used  Substance Use Topics  . Alcohol use: No    Alcohol/week: 0.0 standard drinks  . Drug use: No     Allergies   Gluten meal and Ondansetron   Review of Systems Review of Systems  All other systems  reviewed and are negative.    Physical Exam Updated Vital Signs BP (!) 157/92 (BP Location: Right Arm)   Pulse 96   Temp 98.2 F (36.8 C) (Oral)   Resp 16   LMP  (LMP Unknown)   SpO2 94%   Physical Exam Vitals signs and nursing note reviewed.  Constitutional:      Appearance: She is well-developed.  HENT:     Head: Normocephalic and atraumatic.  Eyes:     Conjunctiva/sclera: Conjunctivae normal.  Neck:     Musculoskeletal: Neck supple.  Cardiovascular:     Rate and Rhythm: Normal rate and regular rhythm.  Pulmonary:     Effort: Pulmonary effort is normal.     Breath sounds: Normal breath sounds.  Abdominal:     General: Bowel sounds are normal.     Palpations: Abdomen is soft.  Musculoskeletal: Normal range of motion.  Skin:    General: Skin is warm and dry.  Neurological:     Mental Status: She is alert and oriented to person, place, and time.  Psychiatric:        Behavior: Behavior normal.      ED Treatments / Results  Labs (all labs ordered are listed, but only abnormal results are displayed) Labs Reviewed - No data to display  EKG None  Radiology No results found.  Procedures Procedures (including critical care time)  Medications Ordered in ED Medications  promethazine (PHENERGAN) tablet 25 mg (25 mg Oral Given 06/29/18 1341)  HYDROmorphone (DILAUDID) injection 1 mg (1 mg Intramuscular Given 06/29/18 1341)     Initial Impression / Assessment and Plan / ED Course  I have reviewed the triage vital signs and the nursing notes.  Pertinent labs & imaging results that were available during my care of the patient were reviewed by me and considered in my medical decision making (see chart for details).       History and physical consistent with her typical headache.  Dilaudid 1 mg IM.  Phenergan 25 mg p.o.  Discharge medication Toradol 10 mg tablets and Phenergan 25 mg suppository.   Final Clinical Impressions(s) / ED Diagnoses   Final diagnoses:   Other migraine without status migrainosus, intractable    ED Discharge Orders         Ordered    ketorolac (TORADOL) 10 MG tablet  Every 6 hours PRN     06/29/18 1355    promethazine (PHENERGAN) 25 MG suppository  Every 6 hours PRN     06/29/18 1355  Nat Christen, MD 06/29/18 1859

## 2018-06-29 NOTE — ED Notes (Signed)
Patient verbalizes understanding of discharge instructions . Opportunity for questions and answers were provided . Armband removed by staff ,Pt discharged from ED. W/C  offered at D/C  and Declined W/C at D/C and was escorted to lobby by RN.  

## 2018-06-29 NOTE — Discharge Instructions (Addendum)
Meds for pain and nausea, rest, increase fluids

## 2018-06-30 ENCOUNTER — Telehealth: Payer: Self-pay

## 2018-06-30 ENCOUNTER — Emergency Department (HOSPITAL_COMMUNITY): Payer: BLUE CROSS/BLUE SHIELD

## 2018-06-30 ENCOUNTER — Other Ambulatory Visit: Payer: Self-pay

## 2018-06-30 ENCOUNTER — Emergency Department (HOSPITAL_COMMUNITY)
Admission: EM | Admit: 2018-06-30 | Discharge: 2018-06-30 | Disposition: A | Discharge status: Home / Self Care | Payer: BLUE CROSS/BLUE SHIELD | Attending: Emergency Medicine | Admitting: Emergency Medicine

## 2018-06-30 DIAGNOSIS — Z7982 Long term (current) use of aspirin: Secondary | ICD-10-CM | POA: Insufficient documentation

## 2018-06-30 DIAGNOSIS — R51 Headache: Secondary | ICD-10-CM | POA: Insufficient documentation

## 2018-06-30 DIAGNOSIS — R519 Headache, unspecified: Secondary | ICD-10-CM

## 2018-06-30 DIAGNOSIS — Z79899 Other long term (current) drug therapy: Secondary | ICD-10-CM | POA: Diagnosis not present

## 2018-06-30 DIAGNOSIS — I10 Essential (primary) hypertension: Secondary | ICD-10-CM | POA: Insufficient documentation

## 2018-06-30 MED ORDER — HALOPERIDOL LACTATE 5 MG/ML IJ SOLN
5.0000 mg | Freq: Once | INTRAMUSCULAR | Status: AC
  Administered 2018-06-30: 5 mg via INTRAVENOUS
  Filled 2018-06-30: qty 1

## 2018-06-30 MED ORDER — SODIUM CHLORIDE 0.9 % IV BOLUS
500.0000 mL | Freq: Once | INTRAVENOUS | Status: AC
  Administered 2018-06-30: 500 mL via INTRAVENOUS

## 2018-06-30 NOTE — ED Notes (Signed)
Patient verbalizes understanding of discharge instructions. Opportunity for questioning and answers were provided. Armband removed by staff, pt discharged from ED.  

## 2018-06-30 NOTE — Discharge Instructions (Signed)
Follow-up with primary care provider for further management of your headaches and high blood pressure. Return to ER for new or worsening symptoms.

## 2018-06-30 NOTE — ED Provider Notes (Signed)
[facility] EMERGENCY DEPARTMENT Provider Note   CSN: [id number] Arrival date & time: 06/30/18  1352    History   Chief Complaint Chief Complaint  Patient presents with   Headache    HPI [name] is a 53 y.o. female.     53 year old female presents with complaint of headache.  Patient states that she has a history of migraine headaches and the pain pattern from the top of her head to behind her eyes is typical however her headaches do not normally last 5 days.  Patient was seen at urgent care initially, given Toradol, Reglan, Decadron and states that her headache returned that evening.  Patient returned to the emergency room yesterday, seen and given injection of Dilaudid with Phenergan which she states helped until later in the evening.  Patient states her headache returned and she took Toradol this morning which helped a little bit however headache persisted and she was not sure she should take another Toradol but felt like since she would need a note for work she should come back to the ER.  Patient reports nausea without vomiting, denies fevers, rash, neck pain or stiffness, sick contacts, recent cold or illness.  No other complaints or concerns.     Past Medical History:  Diagnosis Date   Anemia    Celiac disease    Elevated blood pressure reading    Family history of adverse reaction to anesthesia    MOM-HARD TIME WAKING UP   GERD (gastroesophageal reflux disease)    Migraine with visual aura    MIGRAINES   UTI (lower urinary tract infection)     Patient Active Problem List   Diagnosis Date Noted   Post-operative state 07/25/2017   Blood loss anemia 03/05/2017   BV (bacterial vaginosis) 11/27/2012   UTI (lower urinary tract infection) 11/27/2012   Migraine with visual aura 09/19/2011   Depression 09/19/2011   Constipation 06/25/2011    Past Surgical History:  Procedure Laterality Date   BLADDER SUSPENSION     EXPLORATORY  LAPAROTOMY     IUD REMOVAL  07/25/2017   Procedure: INTRAUTERINE DEVICE (IUD) REMOVAL;  Surgeon: Harlin Heys, MD;  Location: [facility] ORS;  Service: Gynecology;;   LAPAROSCOPIC ASSISTED VAGINAL HYSTERECTOMY Bilateral 07/25/2017   Procedure: LAPAROSCOPIC ASSISTED VAGINAL HYSTERECTOMY WITH BILATERAL [place];  Surgeon: Harlin Heys, MD;  Location: [facility] ORS;  Service: Gynecology;  Laterality: Bilateral;   TUBAL LIGATION       OB History    Gravida  3   Para  3   Term  3   Preterm      AB      Living  3     SAB      TAB      Ectopic      Multiple      Live Births  3            Home Medications    Prior to Admission medications   Medication Sig Start Date End Date Taking? Authorizing Provider  aspirin-acetaminophen-caffeine (EXCEDRIN MIGRAINE) [phone] MG tablet Take by mouth every 6 (six) hours as needed for headache.    [provider]  buPROPion (WELLBUTRIN XL) 150 MG 24 hr tablet TAKE 1 TABLET BY MOUTH EVERY DAY 02/06/18   Delia Chimes A, MD  cetirizine (ZYRTEC) 10 MG tablet Take 1 tablet (10 mg total) by mouth daily. 06/28/18   Tasia Catchings, Amy V, PA-C  estradiol (ESTRACE) 1 MG tablet TAKE 1  TABLET BY MOUTH EVERY DAY 11/20/17   Harlin Heys, MD  fluticasone [facility]) 50 MCG/ACT nasal spray Place 2 sprays into both nostrils daily. 06/28/18   Tasia Catchings, Amy V, PA-C  ibuprofen (ADVIL,MOTRIN) 600 MG tablet Take 1 tablet (600 mg total) by mouth every 6 (six) hours as needed. 06/25/17   Arta Silence, MD  ketorolac (TORADOL) 10 MG tablet Take 1 tablet (10 mg total) by mouth every 6 (six) hours as needed. 06/29/18   Nat Christen, MD  omeprazole (PRILOSEC) 20 MG capsule TAKE 1 CAPSULE BY MOUTH EVERY DAY 06/02/18   Forrest Moron, MD  predniSONE (DELTASONE) 20 MG tablet Two daily with food 05/03/18   Robyn Haber, MD  promethazine (PHENERGAN) 25 MG suppository Place 1 suppository (25 mg total) rectally every 6 (six) hours as needed for nausea or  vomiting. 06/29/18   Nat Christen, MD  propranolol (INDERAL) 40 MG tablet Take 1 tablet (40 mg total) by mouth daily for 30 days. 06/02/18 07/02/18  Chase Picket, MD    Family History Family History  Problem Relation Age of Onset   Diabetes Son 20       type 1    Social History Social History   Tobacco Use   Smoking status: Never Smoker   Smokeless tobacco: Never Used  Substance Use Topics   Alcohol use: No    Alcohol/week: 0.0 standard drinks   Drug use: No     Allergies   Gluten meal and Ondansetron   Review of Systems Review of Systems  Constitutional: Negative for chills and fever.  Eyes: Negative for visual disturbance.  Gastrointestinal: Positive for nausea. Negative for abdominal pain and vomiting.  Musculoskeletal: Negative for arthralgias, back pain, myalgias, neck pain and neck stiffness.  Skin: Negative for rash and wound.  Allergic/Immunologic: Negative for immunocompromised state.  Neurological: Positive for headaches. Negative for dizziness, speech difficulty and weakness.  Psychiatric/Behavioral: Negative for confusion.  All other systems reviewed and are negative.    Physical Exam Updated Vital Signs BP (!) 146/90    Pulse 66    Temp (!) 97.5 F (36.4 C) (Oral)    Resp 18    LMP  (LMP Unknown)    SpO2 98%   Physical Exam Vitals signs and nursing note reviewed.  Constitutional:      General: She is not in acute distress.    Appearance: She is well-developed. She is not diaphoretic.  HENT:     Head: Normocephalic and atraumatic.     Mouth/Throat:     Mouth: Mucous membranes are moist.  Eyes:     General: No visual field deficit.    Extraocular Movements: Extraocular movements intact.     Right eye: Normal extraocular motion and no nystagmus.     Left eye: Normal extraocular motion and no nystagmus.     Pupils: Pupils are equal, round, and reactive to light.  Neck:     Musculoskeletal: Normal range of motion and neck supple.    Cardiovascular:     Rate and Rhythm: Normal rate and regular rhythm.     Heart sounds: Normal heart sounds.  Pulmonary:     Effort: Pulmonary effort is normal.     Breath sounds: Normal breath sounds.  Skin:    General: Skin is warm and dry.  Neurological:     Mental Status: She is alert and oriented to person, place, and time.     GCS: GCS eye subscore is 4. GCS verbal subscore is  5. GCS motor subscore is 6.     Cranial Nerves: No cranial nerve deficit, dysarthria or facial asymmetry.     Sensory: No sensory deficit.     Motor: No weakness.     Deep Tendon Reflexes: Babinski sign absent on the right side. Babinski sign absent on the left side.  Psychiatric:        Mood and Affect: Mood normal.        Speech: Speech normal.        Behavior: Behavior normal.      ED Treatments / Results  Labs (all labs ordered are listed, but only abnormal results are displayed) Labs Reviewed - No data to display  EKG None  Radiology Ct Head Wo Contrast  Result Date: 06/30/2018 CLINICAL DATA:  Progressive headaches EXAM: CT HEAD WITHOUT CONTRAST TECHNIQUE: Contiguous axial images were obtained from the base of the skull through the vertex without intravenous contrast. COMPARISON:  April 10, 2016 FINDINGS: Brain: The ventricles are normal in size and configuration. There is no intracranial mass, hemorrhage, extra-axial fluid collection, or midline shift. The brain parenchyma appears unremarkable. No evident acute infarct. Vascular: No hyperdense vessel. There is no appreciable vascular calcification. Skull: The bony calvarium appears intact. Sinuses/Orbits: There is mucosal thickening in several ethmoid air cells. Other visualized paranasal sinuses are clear. Visualized orbits appear symmetric bilaterally. Other: Mastoid air cells are clear. IMPRESSION: Mucosal thickening in several ethmoid air cells. Study otherwise unremarkable. Electronically Signed   By: Lowella Grip III M.D.   On:  06/30/2018 14:25    Procedures Procedures (including critical care time)  Medications Ordered in ED Medications  haloperidol lactate (HALDOL) injection 5 mg (has no administration in time range)  sodium chloride 0.9 % bolus 500 mL (has no administration in time range)     Initial Impression / Assessment and Plan / ED Course  I have reviewed the triage vital signs and the nursing notes.  Pertinent labs & imaging results that were available during my care of the patient were reviewed by me and considered in my medical decision making (see chart for details).  Clinical Course as of Jun 30 1450  Fri Jun 30, 2018  1451 53yo female with history of headaches, presents for her 3rd vitis for care, headache x 5 days. States headaches do not normally last this long. Exam is unremarkable, CT head completed due to prolonged/atypical course of headache, untreated HTN. CT head is unremarkable. BP has improved to 146/90. Patient was treated with IV Haldol and dc home to follow up with PCP for management of her blood pressure and headaches.   [LM]    Clinical Course User Index [LM] Tacy Learn, PA-C   Final Clinical Impressions(s) / ED Diagnoses   Final diagnoses:  Nonintractable episodic headache, unspecified headache type  Hypertension, unspecified type    ED Discharge Orders    None       Tacy Learn, PA-C 06/30/18 1453    Dorie Rank, MD 07/01/18 [phone]

## 2018-06-30 NOTE — ED Triage Notes (Addendum)
Pt c/o headache x 5 days ; pt states she was here yesterday and received a shot and had some relief but this morning the headache came back again ; pt reports slight nausea with some sensitivity to light ; pt denies any weakness ; pt alert and oriented x 4 ; no nero deficits

## 2018-06-30 NOTE — Telephone Encounter (Signed)
Left message for patient to call back to see if she would like to r/s her appointment for a month out or so, or still wants to come in?

## 2018-07-04 ENCOUNTER — Ambulatory Visit (HOSPITAL_COMMUNITY)
Admission: EM | Admit: 2018-07-04 | Discharge: 2018-07-04 | Disposition: A | Discharge status: Home / Self Care | Payer: BLUE CROSS/BLUE SHIELD | Attending: Family Medicine | Admitting: Family Medicine

## 2018-07-04 ENCOUNTER — Encounter (HOSPITAL_COMMUNITY): Payer: Self-pay | Admitting: Family Medicine

## 2018-07-04 DIAGNOSIS — J40 Bronchitis, not specified as acute or chronic: Secondary | ICD-10-CM

## 2018-07-04 LAB — POCT URINALYSIS DIP (DEVICE)
Bilirubin Urine: NEGATIVE
Glucose, UA: NEGATIVE mg/dL
Ketones, ur: NEGATIVE mg/dL
Leukocytes,Ua: NEGATIVE
Nitrite: NEGATIVE
Protein, ur: NEGATIVE mg/dL
Specific Gravity, Urine: 1.025 (ref 1.005–1.030)
Urobilinogen, UA: 1 mg/dL (ref 0.0–1.0)
pH: 7 (ref 5.0–8.0)

## 2018-07-04 MED ORDER — BENZONATATE 100 MG PO CAPS: 100.0000 mg | ORAL_CAPSULE | Freq: Three times a day (TID) | ORAL | 0 refills | Status: DC | PRN

## 2018-07-04 MED ORDER — ALBUTEROL SULFATE HFA 108 (90 BASE) MCG/ACT IN AERS: 2.0000 | INHALATION_SPRAY | RESPIRATORY_TRACT | 1 refills | Status: DC | PRN

## 2018-07-04 NOTE — ED Triage Notes (Signed)
Pt here for cough and UTI sx

## 2018-07-04 NOTE — Discharge Instructions (Signed)
The urine shows no infection

## 2018-07-04 NOTE — ED Provider Notes (Signed)
[facility]    CSN: [id number] Arrival date & time: 07/04/18  1706     History   Chief Complaint Chief Complaint  Patient presents with  . Cough  . Urinary Tract Infection    HPI [name] is a 53 y.o. female.   This is a 53 year old woman who is well-known here at [facility] urgent care.  She presents with urinary tract infection symptoms and cough.  The cough is been going on about 5 days.  She has had no fever but she did have a slight sore throat when it began.  There is been minimal phlegm production.  She has no shortness of breath or chest pain.  The urinary infection symptoms have been going on about a month.  She is having no abdominal pain or nausea or vomiting.     Past Medical History:  Diagnosis Date  . Anemia   . Celiac disease   . Elevated blood pressure reading   . Family history of adverse reaction to anesthesia    MOM-HARD TIME WAKING UP  . GERD (gastroesophageal reflux disease)   . Migraine with visual aura    MIGRAINES  . UTI (lower urinary tract infection)     Patient Active Problem List   Diagnosis Date Noted  . Constipation 06/25/2011    Priority: High  . Post-operative state 07/25/2017  . Blood loss anemia 03/05/2017  . BV (bacterial vaginosis) 11/27/2012  . UTI (lower urinary tract infection) 11/27/2012  . Migraine with visual aura 09/19/2011  . Depression 09/19/2011    Past Surgical History:  Procedure Laterality Date  . BLADDER SUSPENSION    . EXPLORATORY LAPAROTOMY    . IUD REMOVAL  07/25/2017   Procedure: INTRAUTERINE DEVICE (IUD) REMOVAL;  Surgeon: Harlin Heys, MD;  Location: [facility] ORS;  Service: Gynecology;;  . LAPAROSCOPIC ASSISTED VAGINAL HYSTERECTOMY Bilateral 07/25/2017   Procedure: LAPAROSCOPIC ASSISTED VAGINAL HYSTERECTOMY WITH BILATERAL [place] OOPHERECTOMY;  Surgeon: Harlin Heys, MD;  Location: [facility] ORS;  Service: Gynecology;  Laterality: Bilateral;  . TUBAL LIGATION      OB History    Gravida  3   Para  3   Term  3   Preterm      AB      Living  3     SAB      TAB      Ectopic      Multiple      Live Births  3            Home Medications    Prior to Admission medications   Medication Sig Start Date End Date Taking? Authorizing Provider  albuterol (PROVENTIL HFA;VENTOLIN HFA) 108 (90 Base) MCG/ACT inhaler Inhale 2 puffs into the lungs every 4 (four) hours as needed for wheezing or shortness of breath (cough, shortness of breath or wheezing.). 07/04/18   Robyn Haber, MD  aspirin-acetaminophen-caffeine (EXCEDRIN MIGRAINE) [phone] MG tablet Take by mouth every 6 (six) hours as needed for headache.    [provider]  benzonatate (TESSALON) 100 MG capsule Take 1-2 capsules (100-200 mg total) by mouth 3 (three) times daily as needed for cough. 07/04/18   Robyn Haber, MD  buPROPion (WELLBUTRIN XL) 150 MG 24 hr tablet TAKE 1 TABLET BY MOUTH EVERY DAY 02/06/18   Forrest Moron, MD  cetirizine (ZYRTEC) 10 MG tablet Take 1 tablet (10 mg total) by mouth daily. 06/28/18   Tasia Catchings, Amy V, PA-C  estradiol (ESTRACE) 1 MG tablet TAKE  1 TABLET BY MOUTH EVERY DAY 11/20/17   Harlin Heys, MD  fluticasone [facility]) 50 MCG/ACT nasal spray Place 2 sprays into both nostrils daily. 06/28/18   Tasia Catchings, Amy V, PA-C  ibuprofen (ADVIL,MOTRIN) 600 MG tablet Take 1 tablet (600 mg total) by mouth every 6 (six) hours as needed. 06/25/17   Arta Silence, MD  ketorolac (TORADOL) 10 MG tablet Take 1 tablet (10 mg total) by mouth every 6 (six) hours as needed. 06/29/18   Nat Christen, MD  omeprazole (PRILOSEC) 20 MG capsule TAKE 1 CAPSULE BY MOUTH EVERY DAY 06/02/18   Forrest Moron, MD  predniSONE (DELTASONE) 20 MG tablet Two daily with food 05/03/18   Robyn Haber, MD  promethazine (PHENERGAN) 25 MG suppository Place 1 suppository (25 mg total) rectally every 6 (six) hours as needed for nausea or vomiting. 06/29/18   Nat Christen, MD  propranolol (INDERAL) 40 MG  tablet Take 1 tablet (40 mg total) by mouth daily for 30 days. 06/02/18 07/02/18  Chase Picket, MD    Family History Family History  Problem Relation Age of Onset  . Diabetes Son 63       type 1    Social History Social History   Tobacco Use  . Smoking status: Never Smoker  . Smokeless tobacco: Never Used  Substance Use Topics  . Alcohol use: No    Alcohol/week: 0.0 standard drinks  . Drug use: No     Allergies   Gluten meal and Ondansetron   Review of Systems Review of Systems   Physical Exam Triage Vital Signs ED Triage Vitals  Enc Vitals Group     BP      Pulse      Resp      Temp      Temp src      SpO2      Weight      Height      Head Circumference      Peak Flow      Pain Score      Pain Loc      Pain Edu?      Excl. in [place]?    No data found.  Updated Vital Signs BP (!) 155/83 (BP Location: Left Arm)   Pulse 95   Temp 98.8 F (37.1 C) (Oral)   Resp 18   LMP  (LMP Unknown)   SpO2 97%    Physical Exam Vitals signs and nursing note reviewed.  Constitutional:      General: She is not in acute distress.    Appearance: Normal appearance. She is obese. She is not ill-appearing.  HENT:     Head: Normocephalic.     Right Ear: External ear normal.     Left Ear: External ear normal.     Nose: Congestion present.     Mouth/Throat:     Mouth: Mucous membranes are moist.     Pharynx: Oropharynx is clear.  Eyes:     Conjunctiva/sclera: Conjunctivae normal.  Neck:     Musculoskeletal: Normal range of motion and neck supple.  Cardiovascular:     Rate and Rhythm: Normal rate and regular rhythm.     Heart sounds: Normal heart sounds.  Pulmonary:     Effort: Pulmonary effort is normal.     Breath sounds: Normal breath sounds.  Musculoskeletal: Normal range of motion.  Skin:    General: Skin is warm and dry.  Neurological:     General: No focal deficit  present.     Mental Status: She is alert and oriented to person, place, and time.   Psychiatric:        Mood and Affect: Mood normal.        Behavior: Behavior normal.        Thought Content: Thought content normal.        Judgment: Judgment normal.      UC Treatments / Results  Labs (all labs ordered are listed, but only abnormal results are displayed) Labs Reviewed  POCT URINALYSIS DIP (DEVICE) - Abnormal; Notable for the following components:      Result Value   Hgb urine dipstick TRACE (*)    All other components within normal limits    EKG None  Radiology No results found.  Procedures Procedures (including critical care time)  Medications Ordered in UC Medications - No data to display  Initial Impression / Assessment and Plan / UC Course  I have reviewed the triage vital signs and the nursing notes.  Pertinent labs & imaging results that were available during my care of the patient were reviewed by me and considered in my medical decision making (see chart for details).    Final Clinical Impressions(s) / UC Diagnoses   Final diagnoses:  Bronchitis     Discharge Instructions     The urine shows no infection    ED Prescriptions    Medication Sig Dispense Auth. Provider   albuterol (PROVENTIL HFA;VENTOLIN HFA) 108 (90 Base) MCG/ACT inhaler Inhale 2 puffs into the lungs every 4 (four) hours as needed for wheezing or shortness of breath (cough, shortness of breath or wheezing.). 1 Inhaler Robyn Haber, MD   benzonatate (TESSALON) 100 MG capsule Take 1-2 capsules (100-200 mg total) by mouth 3 (three) times daily as needed for cough. 40 capsule Robyn Haber, MD     Controlled Substance Prescriptions  Controlled Substance Registry consulted? Not Applicable   Robyn Haber, MD 07/04/18 1806

## 2018-07-04 NOTE — ED Notes (Signed)
Patient verbalizes understanding of discharge instructions. Opportunity for questioning and answers were provided. Patient discharged from [facility] by RN.  

## 2018-07-06 ENCOUNTER — Ambulatory Visit: Payer: BLUE CROSS/BLUE SHIELD | Admitting: Family Medicine

## 2018-07-17 ENCOUNTER — Encounter (HOSPITAL_COMMUNITY): Payer: Self-pay

## 2018-07-17 ENCOUNTER — Other Ambulatory Visit: Payer: Self-pay

## 2018-07-17 ENCOUNTER — Ambulatory Visit (HOSPITAL_COMMUNITY)
Admission: EM | Admit: 2018-07-17 | Discharge: 2018-07-17 | Disposition: A | Discharge status: Home / Self Care | Payer: BLUE CROSS/BLUE SHIELD | Attending: Emergency Medicine | Admitting: Emergency Medicine

## 2018-07-17 DIAGNOSIS — G43009 Migraine without aura, not intractable, without status migrainosus: Secondary | ICD-10-CM | POA: Diagnosis not present

## 2018-07-17 MED ORDER — DIPHENHYDRAMINE HCL 25 MG PO CAPS: 50.0000 mg | ORAL_CAPSULE | Freq: Once | ORAL | Status: DC

## 2018-07-17 MED ORDER — KETOROLAC TROMETHAMINE 30 MG/ML IJ SOLN
INTRAMUSCULAR | Status: AC
  Filled 2018-07-17: qty 1

## 2018-07-17 MED ORDER — METOCLOPRAMIDE HCL 5 MG/ML IJ SOLN
10.0000 mg | Freq: Once | INTRAMUSCULAR | Status: AC
  Administered 2018-07-17: 10 mg via INTRAMUSCULAR

## 2018-07-17 MED ORDER — DIPHENHYDRAMINE HCL 25 MG PO CAPS
ORAL_CAPSULE | ORAL | Status: AC
  Filled 2018-07-17: qty 2

## 2018-07-17 MED ORDER — DEXAMETHASONE SODIUM PHOSPHATE 10 MG/ML IJ SOLN
INTRAMUSCULAR | Status: AC
  Filled 2018-07-17: qty 1

## 2018-07-17 MED ORDER — DEXAMETHASONE SODIUM PHOSPHATE 10 MG/ML IJ SOLN
10.0000 mg | Freq: Once | INTRAMUSCULAR | Status: AC
  Administered 2018-07-17: 10 mg via INTRAMUSCULAR

## 2018-07-17 MED ORDER — KETOROLAC TROMETHAMINE 10 MG PO TABS: 10.0000 mg | ORAL_TABLET | Freq: Four times a day (QID) | ORAL | 0 refills | Status: DC | PRN

## 2018-07-17 MED ORDER — METOCLOPRAMIDE HCL 5 MG/ML IJ SOLN
INTRAMUSCULAR | Status: AC
  Filled 2018-07-17: qty 2

## 2018-07-17 MED ORDER — KETOROLAC TROMETHAMINE 30 MG/ML IJ SOLN
30.0000 mg | Freq: Once | INTRAMUSCULAR | Status: AC
  Administered 2018-07-17: 30 mg via INTRAMUSCULAR

## 2018-07-17 NOTE — ED Provider Notes (Signed)
HPI  SUBJECTIVE:  [name] is a 53 y.o. female who reports intermittent gradual onset throbbing, pulsating headaches for the past 4 days.  States that this is identical to previous migraines.  States that sometimes they are unilateral, sometimes diffuse.  They last hours.  She reports nausea, no vomiting.  No fever, ear pain, jaw, dental pain, nasal congestion, sinus pain or pressure, neck stiffness, visual changes, slurred speech, arm or leg weakness, facial droop, discoordination, syncope.  She has been taking Toradol 10 mg p.o. as needed with temporary improvement in her symptoms.  She has not taken the Phenergan prescribed to her by the ER.  Symptoms are worse with lights, noise, bending forward.  She is not on any controller/preventative medications.  No antipyretic in the past 4 to 6 hours.  She is not sure if she has ever tried triptans before.  Patient has been seen here and the ER for migraines- 10x in past year.  States that she gets 2-3 migraines per month.  She was last seen in the ED on 3/20 for headache, had a  negative head CT, given Haldol and told to follow-up with her PMD.  She has been tried on Topamax and propanolol in the past, was not able to tolerate either one of them.   Past medical history of migraines.  No history of stroke, A. fib, aneurysm, diabetes, hypertension.  PMD: She is establishing care with Dr. Einar Pheasant at [facility], states that she had an appointment with him last week, but that they called and canceled due to the coronavirus pandemic.  Neurology: None.   Past Medical History:  Diagnosis Date  . Anemia   . Celiac disease   . Elevated blood pressure reading   . Family history of adverse reaction to anesthesia    MOM-HARD TIME WAKING UP  . GERD (gastroesophageal reflux disease)   . Migraine with visual aura    MIGRAINES  . UTI (lower urinary tract infection)     Past Surgical History:  Procedure Laterality Date  . BLADDER SUSPENSION    .  EXPLORATORY LAPAROTOMY    . IUD REMOVAL  07/25/2017   Procedure: INTRAUTERINE DEVICE (IUD) REMOVAL;  Surgeon: Harlin Heys, MD;  Location: [facility] ORS;  Service: Gynecology;;  . LAPAROSCOPIC ASSISTED VAGINAL HYSTERECTOMY Bilateral 07/25/2017   Procedure: LAPAROSCOPIC ASSISTED VAGINAL HYSTERECTOMY WITH BILATERAL Dyer OOPHERECTOMY;  Surgeon: Harlin Heys, MD;  Location: [facility] ORS;  Service: Gynecology;  Laterality: Bilateral;  . TUBAL LIGATION      Family History  Problem Relation Age of Onset  . Diabetes Son 81       type 1    Social History   Tobacco Use  . Smoking status: Never Smoker  . Smokeless tobacco: Never Used  Substance Use Topics  . Alcohol use: No    Alcohol/week: 0.0 standard drinks  . Drug use: No     Current Facility-Administered Medications:  .  diphenhydrAMINE (BENADRYL) capsule 50 mg, 50 mg, Oral, Once, Melynda Ripple, MD  Current Outpatient Medications:  .  albuterol (PROVENTIL HFA;VENTOLIN HFA) 108 (90 Base) MCG/ACT inhaler, Inhale 2 puffs into the lungs every 4 (four) hours as needed for wheezing or shortness of breath (cough, shortness of breath or wheezing.)., Disp: 1 Inhaler, Rfl: 1 .  benzonatate (TESSALON) 100 MG capsule, Take 1-2 capsules (100-200 mg total) by mouth 3 (three) times daily as needed for cough., Disp: 40 capsule, Rfl: 0 .  buPROPion (WELLBUTRIN XL) 150 MG 24 hr  tablet, TAKE 1 TABLET BY MOUTH EVERY DAY, Disp: 90 tablet, Rfl: 1 .  cetirizine (ZYRTEC) 10 MG tablet, Take 1 tablet (10 mg total) by mouth daily., Disp: 30 tablet, Rfl: 0 .  estradiol (ESTRACE) 1 MG tablet, TAKE 1 TABLET BY MOUTH EVERY DAY, Disp: 30 tablet, Rfl: 0 .  fluticasone (FLONASE) 50 MCG/ACT nasal spray, Place 2 sprays into both nostrils daily., Disp: 1 g, Rfl: 0 .  ketorolac (TORADOL) 10 MG tablet, Take 1 tablet (10 mg total) by mouth every 6 (six) hours as needed., Disp: 20 tablet, Rfl: 0 .  omeprazole (PRILOSEC) 20 MG capsule, TAKE 1 CAPSULE BY MOUTH EVERY  DAY, Disp: 30 capsule, Rfl: 5 .  promethazine (PHENERGAN) 25 MG suppository, Place 1 suppository (25 mg total) rectally every 6 (six) hours as needed for nausea or vomiting., Disp: 12 each, Rfl: 0 .  propranolol (INDERAL) 40 MG tablet, Take 1 tablet (40 mg total) by mouth daily for 30 days., Disp: 30 tablet, Rfl: 0  Allergies  Allergen Reactions  . Gluten Meal Diarrhea and Nausea Only       . Ondansetron Other (See Comments)    Helps her nausea, but makes HA worse     ROS  As noted in HPI.   Physical Exam  BP (!) 143/105 (BP Location: Right Arm)   Pulse 85   Temp 98.3 F (36.8 C) (Oral)   Resp 17   LMP  (LMP Unknown)   SpO2 97%   Constitutional: Well developed, well nourished, no acute distress Eyes: PERRL, EOMI, conjunctiva normal bilaterally.  Positive photophobia  hENT: Normocephalic, atraumatic,mucus membranes moist, normal dentition.  TM normal b/l. No TMJ tenderness. Normal dentition. No nasal congestion, no sinus tenderness. No temporal artery tenderness.  Neck: no cervical LN - trapezial muscle tenderness. No meningismus Respiratory: normal inspiratory effort Cardiovascular: Normal rate GI:  nondistended skin: No rash, skin intact Musculoskeletal: No edema, no tenderness, no deformities Neurologic: Alert & oriented x 3, CN II-XII intact, romberg neg, finger-> nose, heel-> shin equal b/l, Romberg neg, tandem gait steady Psychiatric: Speech and behavior appropriate   ED Course   Medications  diphenhydrAMINE (BENADRYL) capsule 50 mg (50 mg Oral Not Given 07/17/18 0921)  ketorolac (TORADOL) 30 MG/ML injection 30 mg (30 mg Intramuscular Given 07/17/18 0920)  dexamethasone (DECADRON) injection 10 mg (10 mg Intramuscular Given 07/17/18 0920)  metoCLOPramide (REGLAN) injection 10 mg (10 mg Intramuscular Given 07/17/18 0920)    No orders of the defined types were placed in this encounter.  No results found for this or any previous visit (from the past 24 hour(s)). No  results found.   ED Clinical Impression  Migraine without aura and without status migrainosus, not intractable  ED Assessment/Plan  Previous ER and UC records reviewed.  Noted in HPI.  Pt describing typical pain, no sudden onset. Doubt SAH, ICH or space occupying lesion. Pt without fevers/chills, Pt has no meningeal sx, no nuchal rigidity. Doubt meningitis. Pt with normal neuro exam, no evidence of CVA/TIA.  Pt BP not elevated significantly, doubt hypertensive emergency. No evidence of temporal artery tenderness, no evidence of glaucoma or other ocular pathology. Will give headache cocktail ( dexamethasone 10 IM,   toradol 30  IM, Reglan 10 IM),  and reassess.  Pt much improved after medications. Pt with continued non-focal neuro exam.  Discussed with her that it may be time to see a neurologist.  Referring to headache wellness center. also encouraged her to try and get another appointment  with her PMD.  Work note for 2 days.  Will d/c home with a refill on the Toradol, advised Tylenol 1 g with the Toradol. discussed MDM, plan for follow up, signs and sx that should prompt return to ER. Pt agrees with plan  Meds ordered this encounter  Medications  . ketorolac (TORADOL) 30 MG/ML injection 30 mg  . dexamethasone (DECADRON) injection 10 mg  . metoCLOPramide (REGLAN) injection 10 mg  . diphenhydrAMINE (BENADRYL) capsule 50 mg  . ketorolac (TORADOL) 10 MG tablet    Sig: Take 1 tablet (10 mg total) by mouth every 6 (six) hours as needed.    Dispense:  20 tablet    Refill:  0    *This clinic note was created using [employer]. Therefore, there may be occasional mistakes despite careful proofreading.  ?   Melynda Ripple, MD 07/17/18 [phone]

## 2018-07-17 NOTE — ED Triage Notes (Signed)
Patient presents to Urgent Care with complaints of migraine headache on and off since 4 days ago. Patient states she was seen recently in the ER for same, was given meds that help somewhat but the headache keeps coming back.

## 2018-07-17 NOTE — Discharge Instructions (Addendum)
1 g Tylenol with the Toradol.  This is an effective combination.  Phenergan suppository as needed for nausea, vomiting.  Try and rest for the next several days.  Follow-up with a headache wellness center and with your primary care physician as soon as you possibly can.

## 2018-07-19 ENCOUNTER — Ambulatory Visit: Payer: Self-pay

## 2018-07-19 ENCOUNTER — Telehealth: Payer: PRIVATE HEALTH INSURANCE | Admitting: Family Medicine

## 2018-07-19 NOTE — Telephone Encounter (Signed)
Pt c/o increased depressed mood, lack of motivation, detached feeling, loss of interest activity, fatigue, decreased energy, feelings of sadness- Pt stated to the agent who transferred the call, "If I just died, it wouldn't matter." She stated during triage, "If I was gone, everything would be better." Pt denies a plan to hurt or kill herself. Pt denies homicidal ideation. Pt contracted for safety during call.  Pt does not have a therapist. Pt takes daily Wellbutrin and denied missing or not taking her medication. Pt stated that her medication "is not working as well as it once did." Pt stated, "I just feel really tired and don't know how to fix it.". Pt has a h/o migraines and is recovering from a migraine now.  Pt denies illegal drug or alcohol use.  Emotional support given to pt throughout call. Care advice given and pt verbalized understanding.  Call warm transferred to [facility] [place]. Pt has an appt tomorrow with Dr Carlota Raspberry per chart review.              Reason for Disposition . [1] Depression AND [2] worsening (e.g.,sleeping poorly, less able to do activities of daily living)  Additional Information . Commented on: Symptoms interfere with work or school    Pt missed work today . Commented on: Sometimes has thoughts of suicide    Pt stated she does not have a plan but stated if she died it wouldn't matter. No plan or intent  Answer Assessment - Initial Assessment Questions 1. CONCERN: "What happened that made you call today?"     Loss of interest- "if I died it wouldn't matter" 2. DEPRESSION SYMPTOM SCREENING: "How are you feeling overall?" (e.g., decreased energy, increased sleeping or difficulty sleeping, difficulty concentrating, feelings of sadness, guilt, hopelessness, or worthlessness)     Decreased energy, difficulty, detached feeling, fatigued, loss of interest in everything, feelings of sadness, "If I was gone everything would be better." 3. RISK OF HARM - SUICIDAL  IDEATION:  "Do you ever have thoughts of hurting or killing yourself?"  (e.g., yes, no, no but preoccupation with thoughts about death)   - INTENT:  "Do you have thoughts of hurting or killing yourself right NOW?" (e.g., yes, no, N/A)   - PLAN: "Do you have a specific plan for how you would do this?" (e.g., gun, knife, overdose, no plan, N/A)    If I died it wouldn't matter 4. RISK OF HARM - HOMICIDAL IDEATION:  "Do you ever have thoughts of hurting or killing someone else?"  (e.g., yes, no, no but preoccupation with thoughts about death)   - INTENT:  "Do you have thoughts of hurting or killing someone right NOW?" (e.g., yes, no, N/A)   - PLAN: "Do you have a specific plan for how you would do this?" (e.g., gun, knife, no plan, N/A)      no 5. FUNCTIONAL IMPAIRMENT: "How have things been going for you overall in your life? Have you had any more difficulties than usual doing your normal daily activities?"  (e.g., better, same, worse; self-care, school, work, interactions)     Pretty good- i feel tired all the time, and im having trouble sleeping.  Takes more effort to do anything- missed work today.  6. SUPPORT: "Who is with you now?" "Who do you live with?" "Do you have family or friends nearby who you can talk to?"      Alone-husband and father-sons and grandsons- 52. THERAPIST: "Do you have a counselor or therapist? Name?"  no 8. STRESSORS: "Has there been any new stress or recent changes in your life?"     Got real tired and exhausted and I dont know how to fix it 9. DRUG ABUSE/ALCOHOL: "Do you drink alcohol or use any illegal drugs?"      no 10. OTHER: "Do you have any other health or medical symptoms right now?" (e.g., fever)       Migraine  11. PREGNANCY: "Is there any chance you are pregnant?" "When was your last menstrual period?"       n/a  Protocols used: DEPRESSION-A-AH

## 2018-07-20 ENCOUNTER — Other Ambulatory Visit: Payer: Self-pay

## 2018-07-20 ENCOUNTER — Telehealth (INDEPENDENT_AMBULATORY_CARE_PROVIDER_SITE_OTHER): Payer: BLUE CROSS/BLUE SHIELD | Admitting: Family Medicine

## 2018-07-20 DIAGNOSIS — G4700 Insomnia, unspecified: Secondary | ICD-10-CM

## 2018-07-20 DIAGNOSIS — F339 Major depressive disorder, recurrent, unspecified: Secondary | ICD-10-CM

## 2018-07-20 DIAGNOSIS — Z8669 Personal history of other diseases of the nervous system and sense organs: Secondary | ICD-10-CM

## 2018-07-20 MED ORDER — BUPROPION HCL ER (XL) 300 MG PO TB24: 300.0000 mg | ORAL_TABLET | Freq: Every day | ORAL | 2 refills | Status: DC

## 2018-07-20 MED ORDER — HYDROXYZINE HCL 25 MG PO TABS: 12.5000 mg | ORAL_TABLET | Freq: Every evening | ORAL | 0 refills | Status: DC | PRN

## 2018-07-20 NOTE — Telephone Encounter (Signed)
See OV. Discussed with patient today.

## 2018-07-20 NOTE — Progress Notes (Signed)
Virtual Visit via Telephone Note  I connected with [name] on 07/20/18 at 3:31 PM by telephone and verified that I am speaking with the correct person using two identifiers.   I discussed the limitations, risks, security and privacy concerns of performing an evaluation and management service by telephone and the availability of in person appointments. I also discussed with the patient that there may be [name] patient responsible charge related to this service. The patient expressed understanding and agreed to proceed, consent obtained  Chief complaint: Depression and migraines.   History of Present Illness:  Depression: Last discussed with Dr. Nolon [name] in August 2019.  PHQ score of 13 at that time.  Denied recent suicidal thoughts at that time.  She was started on Wellbutrin XL 150 mg daily, initially recommended every other day for first week.  Plan for recheck office visit in 4 weeks for depression, but has not been seen since that visit.  She had also been referred to psychiatry November 26, 2017 visit.  That referral was closed as multiple messages were left for patient and had not return calls to set up services.  Has not met with psychiatry or other PCP since last year. Feeling more zoned out.  Increased anhedonia, to point of not caring if living or dying.  Denies any suicidal ideation/intent/or plan since prior to Dr. Nolon [name] visit.  Some difficulty sleeping at night, gets up 4-5 times per night. Snacking at these times. decreased motivation past 4-5 weeks. Does not enjoy work anymore. Thought was due to migraine.  Has not talked to counselor or pastor.   Still taking wellbutrin once per day.   Depression screen [facility] 2/9 07/20/2018 12/03/2017 11/26/2017 10/03/2015 07/06/2015  Decreased Interest 3 1 0 0 0  Down, Depressed, Hopeless 3 1 0 0 0  PHQ - 2 Score 6 2 0 0 0  Altered sleeping 3 3 - - -  Tired, decreased energy 3 1 - - -  Change in appetite 3 3 - - -  Feeling bad or failure  about yourself  2 3 - - -  Trouble concentrating 0 0 - - -  Moving slowly or fidgety/restless 1 0 - - -  Suicidal thoughts 1 1 - - -  PHQ-9 Score 19 13 - - -  Difficult doing work/chores Extremely dIfficult Not difficult at all - - -   Migraine HA: It appears she has been evaluated through the ER or urgent care approximately 11 times since last August for migraines or headaches. Most recently seen April 6 through [facility] urgent care.  Was taking Toradol 10 mg p.o. as needed with temporary improvement in symptoms.  Had not taken Phenergan prescribed by the ER.  Head CT reportedly negative in March, and apparently was not able to tolerate Topamax or propanolol in the past. It appears there was [name] plan for her to set up primary care with [facility].  She was given [name] headache cocktail with dexamethasone 10 mg IM, Toradol 30 mg IM, Reglan 10 mg IM at urgent care visit.  Symptoms were significantly improved after meds, nonfocal neuro exam.  She was referred to headache wellness center and advised to follow-up with her primary provider.  Toradol was refilled, recommended Tylenol with the Toradol.  Has tried triptans in past - thinks may have made lightheaded.   Some water, some sodas. Minimal caffeine.     Patient Active Problem List   Diagnosis Date Noted  . Post-operative state 07/25/2017  .  Blood loss anemia 03/05/2017  . BV (bacterial vaginosis) 11/27/2012  . UTI (lower urinary tract infection) 11/27/2012  . Migraine with visual aura 09/19/2011  . Depression 09/19/2011  . Constipation 06/25/2011   Past Medical History:  Diagnosis Date  . Anemia   . Celiac disease   . Elevated blood pressure reading   . Family history of adverse reaction to anesthesia    MOM-HARD TIME WAKING UP  . GERD (gastroesophageal reflux disease)   . Migraine with visual aura    MIGRAINES  . UTI (lower urinary tract infection)    Past Surgical History:  Procedure Laterality Date  . BLADDER  SUSPENSION    . EXPLORATORY LAPAROTOMY    . IUD REMOVAL  07/25/2017   Procedure: INTRAUTERINE DEVICE (IUD) REMOVAL;  Surgeon: Harlin Heys, MD;  Location: [facility] ORS;  Service: Gynecology;;  . LAPAROSCOPIC ASSISTED VAGINAL HYSTERECTOMY Bilateral 07/25/2017   Procedure: LAPAROSCOPIC ASSISTED VAGINAL HYSTERECTOMY WITH BILATERAL [place] OOPHERECTOMY;  Surgeon: Harlin Heys, MD;  Location: [facility] ORS;  Service: Gynecology;  Laterality: Bilateral;  . TUBAL LIGATION     Allergies  Allergen Reactions  . Gluten Meal Diarrhea and Nausea Only       . Ondansetron Other (See Comments)    Helps her nausea, but makes HA worse   Prior to Admission medications   Medication Sig Start Date End Date Taking? Authorizing Provider  albuterol (PROVENTIL HFA;VENTOLIN HFA) 108 (90 Base) MCG/ACT inhaler Inhale 2 puffs into the lungs every 4 (four) hours as needed for wheezing or shortness of breath (cough, shortness of breath or wheezing.). 07/04/18  Yes Ariel Haber, MD  buPROPion (WELLBUTRIN XL) 150 MG 24 hr tablet TAKE 1 TABLET BY MOUTH EVERY DAY 02/06/18  Yes Ariel Chimes A, MD  cetirizine (ZYRTEC) 10 MG tablet Take 1 tablet (10 mg total) by mouth daily. 06/28/18  Yes Yu, [name]  ketorolac (TORADOL) 10 MG tablet Take 1 tablet (10 mg total) by mouth every 6 (six) hours as needed. 07/17/18  Yes Ariel Ripple, MD  omeprazole (PRILOSEC) 20 MG capsule TAKE 1 CAPSULE BY MOUTH EVERY DAY 06/02/18  Yes Ariel Moron, MD  promethazine (PHENERGAN) 25 MG suppository Place 1 suppository (25 mg total) rectally every 6 (six) hours as needed for nausea or vomiting. 06/29/18  Yes Ariel Christen, MD  fluticasone [facility]) 50 MCG/ACT nasal spray Place 2 sprays into both nostrils daily. Patient not taking: Reported on 07/20/2018 06/28/18   [name]   Social History   Socioeconomic History  . Marital status: Married    Spouse name: [name]  . Number of children: 3  . Years of education: 68  . Highest  education level: Not on file  Occupational History  . Occupation: Personnel officer  Social Needs  . Financial resource strain: Not on file  . Food insecurity:    Worry: Not on file    Inability: Not on file  . Transportation needs:    Medical: Not on file    Non-medical: Not on file  Tobacco Use  . Smoking status: Never Smoker  . Smokeless tobacco: Never Used  Substance and Sexual Activity  . Alcohol use: No    Alcohol/week: 0.0 standard drinks  . Drug use: No  . Sexual activity: Yes    Partners: Female    Birth control/protection: Surgical    Comment: INTERCOURSE AGE 64, SEXUAL PARTNERS LEES THAN 5  Lifestyle  . Physical activity:    Days per week:  Not on file    Minutes per session: Not on file  . Stress: Not on file  Relationships  . Social connections:    Talks on phone: Not on file    Gets together: Not on file    Attends religious service: Not on file    Active member of club or organization: Not on file    Attends meetings of clubs or organizations: Not on file    Relationship status: Not on file  . Intimate partner violence:    Fear of current or ex partner: Not on file    Emotionally abused: Not on file    Physically abused: Not on file    Forced sexual activity: Not on file  Other Topics Concern  . Not on file  Social History Narrative   Lives with her husband and their three children, and her daughter's boyfriend.  Her older son's daughter lives there part-time as well.     Observations/Objective: Appropriate responses. Normal speech, No distress. Denies SI.   Assessment and Plan: Episode of recurrent major depressive disorder, unspecified depression episode severity ([place]) - Plan: buPROPion (WELLBUTRIN XL) 300 MG 24 hr tablet.  Worsening depression but denies true suicidal ideation, intent or plan.  No 1/ER precautions discussed if any suicidal ideation with understanding expressed  -Increase Wellbutrin to 300 mg daily, potential side  effects discussed, improve sleep hygiene as below, and recommended counseling with phone number provided.  -Out of work for 1 week, recheck office visit by tele-med in 1 week  History of migraine  -Recurrent migraines, agree with follow-up with headache specialist and she plans to call for appointment.  Disordered sleep may be contributing as well as stress/depression symptoms.  Treatment as above.  Due to previous intolerance of medications, no changes were provided at this time including triptan or other preventative until she meets with neurologist.  ER/RTC precautions given.  Insomnia, unspecified type - Plan: hydrOXYzine (ATARAX/VISTARIL) 25 MG tablet  -Improve sleep hygiene discussed including limiting snacking at night.  Trial of Vistaril before bed with potential side effects discussed.  Handout given on sleep hygiene.  Follow Up Instructions:  1 week follow-up, out of work note until that time.   Patient Instructions  Follow up with headache specialist as planned. Toradol with tylenol as discussed at Urgent Care. Return to the clinic or go to the nearest emergency room if any of your symptoms worsen or new symptoms occur.  For sleep: See info below on sleep hygiene, cut back on snacks at night. Try hydroxyzine 1/2 - 1 at night to help with sleep. Improved sleep should help headaches as well.   For depression: Increase Wellbutrin to 350m per day.  please call counselor: [facility]: [phone] Follow up appointment in next week - telemed appointment. We can discuss return to work at that time. 1 week note will be provided for now.     Major Depressive Disorder, Adult Major depressive disorder (MDD) is [name] mental health condition. MDD often makes you feel sad, hopeless, or helpless. MDD can also cause symptoms in your body. MDD can affect your:  Work.  School.  Relationships.  Other normal activities. MDD can range from mild to very bad. It may occur  once (single episode MDD). It can also occur many times (recurrent MDD). The main symptoms of MDD often include:  Feeling sad, depressed, or irritable most of the time.  Loss of interest. MDD symptoms also include:  Sleeping too much or too  little.  Eating too much or too little.  [name] change in your weight.  Feeling tired (fatigue) or having low energy.  Feeling worthless.  Feeling guilty.  Trouble making decisions.  Trouble thinking clearly.  Thoughts of suicide or harming others.  Feeling weak.  Feeling agitated.  Keeping yourself from being around other people (isolation). Follow these instructions at home: Activity  Do these things as told by your doctor: ? Go back to your normal activities. ? Exercise regularly. ? Spend time outdoors. Alcohol  Talk with your doctor about how alcohol can affect your antidepressant medicines.  Do not drink alcohol. Or, limit how much alcohol you drink. ? This means no more than 1 drink [name] day for nonpregnant women and 2 drinks [name] day for men. One drink equals one of these:  12 oz of beer.  5 oz of wine.  1 oz of hard liquor. General instructions  Take over-the-counter and prescription medicines only as told by your doctor.  Eat [name] healthy diet.  Get plenty of sleep.  Find activities that you enjoy. Make time to do them.  Think about joining [name] support group. Your doctor may be able to suggest [name] group for you.  Keep all follow-up visits as told by your doctor. This is important. Where to find more information:  [employer] on Mental Illness: ? www.nami.[place]: ? https://carter.com/  National Suicide Prevention Lifeline: ? 830-713-3224. This is free, 24-hour help. Contact [name] doctor if:  Your symptoms get worse.  You have new symptoms. Get help right away if:  You self-harm.  You see, hear, taste, smell, or feel things that are not present (hallucinate). If you  ever feel like you may hurt yourself or others, or have thoughts about taking your own life, get help right away. You can go to your nearest emergency department or call:  Your local emergency services (911 in the [place]  [name] suicide crisis helpline, such as the National Suicide Prevention Lifeline: ? 605-853-1100. This is open 24 hours [name] day. This information is not intended to replace advice given to you by your health care provider. Make sure you discuss any questions you have with your health care provider. Document Released: 03/10/2015 Document Revised: 12/14/2015 Document Reviewed: 12/14/2015 Elsevier Interactive Patient Education  2019 [place].  Insomnia Insomnia is [name] sleep disorder that makes it difficult to fall asleep or stay asleep. Insomnia can cause fatigue, low energy, difficulty concentrating, mood swings, and poor performance at work or school. There are three different ways to classify insomnia:  Difficulty falling asleep.  Difficulty staying asleep.  Waking up too early in the morning. Any type of insomnia can be long-term (chronic) or short-term (acute). Both are common. Short-term insomnia usually lasts for three months or less. Chronic insomnia occurs at least three times [name] week for longer than three months. What are the causes? Insomnia may be caused by another condition, situation, or substance, such as:  Anxiety.  Certain medicines.  Gastroesophageal reflux disease (GERD) or other gastrointestinal conditions.  Asthma or other breathing conditions.  Restless legs syndrome, sleep apnea, or other sleep disorders.  Chronic pain.  Menopause.  Stroke.  Abuse of alcohol, tobacco, or illegal drugs.  Mental health conditions, such as depression.  Caffeine.  Neurological disorders, such as Alzheimer's disease.  An overactive thyroid (hyperthyroidism). Sometimes, the cause of insomnia may not be known. What increases the risk? Risk factors for  insomnia include:  Gender. Women  are affected more often than men.  Age. Insomnia is more common as you get older.  Stress.  Lack of exercise.  Irregular work schedule or working night shifts.  Traveling between different time zones.  Certain medical and mental health conditions. What are the signs or symptoms? If you have insomnia, the main symptom is having trouble falling asleep or having trouble staying asleep. This may lead to other symptoms, such as:  Feeling fatigued or having low energy.  Feeling nervous about going to sleep.  Not feeling rested in the morning.  Having trouble concentrating.  Feeling irritable, anxious, or depressed. How is this diagnosed? This condition may be diagnosed based on:  Your symptoms and medical history. Your health care provider may ask about: ? Your sleep habits. ? Any medical conditions you have. ? Your mental health.  [name] physical exam. How is this treated? Treatment for insomnia depends on the cause. Treatment may focus on treating an underlying condition that is causing insomnia. Treatment may also include:  Medicines to help you sleep.  Counseling or therapy.  Lifestyle adjustments to help you sleep better. Follow these instructions at home: Eating and drinking   Limit or avoid alcohol, caffeinated beverages, and cigarettes, especially close to bedtime. These can disrupt your sleep.  Do not eat [name] large meal or eat spicy foods right before bedtime. This can lead to digestive discomfort that can make it hard for you to sleep. Sleep habits   Keep [name] sleep diary to help you and your health care provider figure out what could be causing your insomnia. Write down: ? When you sleep. ? When you wake up during the night. ? How well you sleep. ? How rested you feel the next day. ? Any side effects of medicines you are taking. ? What you eat and drink.  Make your bedroom [name] dark, comfortable place where it is easy to fall  asleep. ? Put up shades or blackout curtains to block light from outside. ? Use [name] white noise machine to block noise. ? Keep the temperature cool.  Limit screen use before bedtime. This includes: ? Watching TV. ? Using your smartphone, tablet, or computer.  Stick to [name] routine that includes going to bed and waking up at the same times every day and night. This can help you fall asleep faster. Consider making [name] quiet activity, such as reading, part of your nighttime routine.  Try to avoid taking naps during the day so that you sleep better at night.  Get out of bed if you are still awake after 15 minutes of trying to sleep. Keep the lights down, but try reading or doing [name] quiet activity. When you feel sleepy, go back to bed. General instructions  Take over-the-counter and prescription medicines only as told by your health care provider.  Exercise regularly, as told by your health care provider. Avoid exercise starting several hours before bedtime.  Use relaxation techniques to manage stress. Ask your health care provider to suggest some techniques that may work well for you. These may include: ? Breathing exercises. ? Routines to release muscle tension. ? Visualizing peaceful scenes.  Make sure that you drive carefully. Avoid driving if you feel very sleepy.  Keep all follow-up visits as told by your health care provider. This is important. Contact [name] health care provider if:  You are tired throughout the day.  You have trouble in your daily routine due to sleepiness.  You continue to have sleep problems, or  your sleep problems get worse. Get help right away if:  You have serious thoughts about hurting yourself or someone else. If you ever feel like you may hurt yourself or others, or have thoughts about taking your own life, get help right away. You can go to your nearest emergency department or call:  Your local emergency services (911 in the [place]  [name] suicide crisis  helpline, such as the [place] at 954-230-0273. This is open 24 hours [name] day. Summary  Insomnia is [name] sleep disorder that makes it difficult to fall asleep or stay asleep.  Insomnia can be long-term (chronic) or short-term (acute).  Treatment for insomnia depends on the cause. Treatment may focus on treating an underlying condition that is causing insomnia.  Keep [name] sleep diary to help you and your health care provider figure out what could be causing your insomnia. This information is not intended to replace advice given to you by your health care provider. Make sure you discuss any questions you have with your health care provider. Document Released: 03/26/2000 Document Revised: 01/06/2017 Document Reviewed: 01/06/2017 Elsevier Interactive Patient Education  2019 [place].     Migraine Headache [name] migraine headache is an intense, throbbing pain on one side or both sides of the head. Migraines may also cause other symptoms, such as nausea, vomiting, and sensitivity to light and noise. What are the causes? Doing or taking certain things may also trigger migraines, such as:  Alcohol.  Smoking.  Medicines, such as: ? Medicine used to treat chest pain (nitroglycerine). ? Birth control pills. ? Estrogen pills. ? Certain blood pressure medicines.  Aged cheeses, chocolate, or caffeine.  Foods or drinks that contain nitrates, glutamate, aspartame, or tyramine.  Physical activity. Other things that may trigger [name] migraine include:  Menstruation.  Pregnancy.  Hunger.  Stress, lack of sleep, too much sleep, or fatigue.  Weather changes. What increases the risk? The following factors may make you more likely to experience migraine headaches:  Age. Risk increases with age.  Family history of migraine headaches.  Being Caucasian.  Depression and anxiety.  Obesity.  Being [name] woman.  Having [name] hole in the heart (patent foramen ovale) or  other heart problems. What are the signs or symptoms? The main symptom of this condition is pulsating or throbbing pain. Pain may:  Happen in any area of the head, such as on one side or both sides.  Interfere with daily activities.  Get worse with physical activity.  Get worse with exposure to bright lights or loud noises. Other symptoms may include:  Nausea.  Vomiting.  Dizziness.  General sensitivity to bright lights, loud noises, or smells. Before you get [name] migraine, you may get warning signs that [name] migraine is developing (aura). An aura may include:  Seeing flashing lights or having blind spots.  Seeing bright spots, halos, or zigzag lines.  Having tunnel vision or blurred vision.  Having numbness or [name] tingling feeling.  Having trouble talking.  Having muscle weakness. How is this diagnosed? [name] migraine headache can be diagnosed based on:  Your symptoms.  [name] physical exam.  Tests, such as CT scan or MRI of the head. These imaging tests can help rule out other causes of headaches.  Taking fluid from the spine (lumbar puncture) and analyzing it (cerebrospinal fluid analysis, or CSF analysis). How is this treated? [name] migraine headache is usually treated with medicines that:  Relieve pain.  Relieve nausea.  Prevent migraines from coming  back. Treatment may also include:  Acupuncture.  Lifestyle changes like avoiding foods that trigger migraines. Follow these instructions at home: Medicines  Take over-the-counter and prescription medicines only as told by your health care provider.  Do not drive or use heavy machinery while taking prescription pain medicine.  To prevent or treat constipation while you are taking prescription pain medicine, your health care provider may recommend that you: ? Drink enough fluid to keep your urine clear or pale yellow. ? Take over-the-counter or prescription medicines. ? Eat foods that are high in fiber, such as fresh  fruits and vegetables, whole grains, and beans. ? Limit foods that are high in fat and processed sugars, such as fried and sweet foods. Lifestyle  Avoid alcohol use.  Do not use any products that contain nicotine or tobacco, such as cigarettes and e-cigarettes. If you need help quitting, ask your health care provider.  Get at least 8 hours of sleep every night.  Limit your stress. General instructions      Keep [name] journal to find out what may trigger your migraine headaches. For example, write down: ? What you eat and drink. ? How much sleep you get. ? Any change to your diet or medicines.  If you have [name] migraine: ? Avoid things that make your symptoms worse, such as bright lights. ? It may help to lie down in [name] dark, quiet room. ? Do not drive or use heavy machinery. ? Ask your health care provider what activities are safe for you while you are experiencing symptoms.  Keep all follow-up visits as told by your health care provider. This is important. Contact [name] health care provider if:  You develop symptoms that are different or more severe than your usual migraine symptoms. Get help right away if:  Your migraine becomes severe.  You have [name] fever.  You have [name] stiff neck.  You have vision loss.  Your muscles feel weak or like you cannot control them.  You start to lose your balance often.  You develop trouble walking.  You faint. This information is not intended to replace advice given to you by your health care provider. Make sure you discuss any questions you have with your health care provider. Document Released: 03/29/2005 Document Revised: 10/17/2015 Document Reviewed: 09/15/2015 Elsevier Interactive Patient Education  2019 [employer].    I discussed the assessment and treatment plan with the patient. The patient was provided an opportunity to ask questions and all were answered. The patient agreed with the plan and demonstrated an understanding of the  instructions.   The patient was advised to call back or seek an in-person evaluation if the symptoms worsen or if the condition fails to improve as anticipated.  I provided 21 minutes of non-face-to-face time during this encounter.  Signed,   Merri Ray, MD [facility] [place].  07/20/18

## 2018-07-20 NOTE — Progress Notes (Signed)
Depression f/u PHQ9 =19 She is having margarine aswell.  1.  Feeling nervous, anxious, or on edge                       0 2.  Not being able to stop or control worrying                 0   3.  Worrying too much about different things                 0 4.  Trouble relaxing                                                         3 5.  Being so restless that it's hard to sit still                   3   6.  Becoming easily annoyed or irritable       3   7.  Feeling afraid as if something awful might happen   0                          Total Score                 9

## 2018-07-20 NOTE — Patient Instructions (Addendum)
Follow up with headache specialist as planned. Toradol with tylenol as discussed at Urgent Care. Return to the clinic or go to the nearest emergency room if any of your symptoms worsen or new symptoms occur.  For sleep: See info below on sleep hygiene, cut back on snacks at night. Try hydroxyzine 1/2 - 1 at night to help with sleep. Improved sleep should help headaches as well.   For depression: Increase Wellbutrin to 300mg  per day.  please call counselor: [facility]: [phone]  Follow up appointment in next week - telemed appointment. We can discuss return to work at that time. 1 week note will be provided for now.     Major Depressive Disorder, Adult Major depressive disorder (MDD) is a mental health condition. MDD often makes you feel sad, hopeless, or helpless. MDD can also cause symptoms in your body. MDD can affect your:  Work.  School.  Relationships.  Other normal activities. MDD can range from mild to very bad. It may occur once (single episode MDD). It can also occur many times (recurrent MDD). The main symptoms of MDD often include:  Feeling sad, depressed, or irritable most of the time.  Loss of interest. MDD symptoms also include:  Sleeping too much or too little.  Eating too much or too little.  A change in your weight.  Feeling tired (fatigue) or having low energy.  Feeling worthless.  Feeling guilty.  Trouble making decisions.  Trouble thinking clearly.  Thoughts of suicide or harming others.  Feeling weak.  Feeling agitated.  Keeping yourself from being around other people (isolation). Follow these instructions at home: Activity  Do these things as told by your doctor: ? Go back to your normal activities. ? Exercise regularly. ? Spend time outdoors. Alcohol  Talk with your doctor about how alcohol can affect your antidepressant medicines.  Do not drink alcohol. Or, limit how much alcohol you drink. ? This means  no more than 1 drink a day for nonpregnant women and 2 drinks a day for men. One drink equals one of these:  12 oz of beer.  5 oz of wine.  1 oz of hard liquor. General instructions  Take over-the-counter and prescription medicines only as told by your doctor.  Eat a healthy diet.  Get plenty of sleep.  Find activities that you enjoy. Make time to do them.  Think about joining a support group. Your doctor may be able to suggest a group for you.  Keep all follow-up visits as told by your doctor. This is important. Where to find more information:  [employer] on Mental Illness: ? www.nami.[place]: ? https://carter.com/  National Suicide Prevention Lifeline: ? 779-264-4700. This is free, 24-hour help. Contact a doctor if:  Your symptoms get worse.  You have new symptoms. Get help right away if:  You self-harm.  You see, hear, taste, smell, or feel things that are not present (hallucinate). If you ever feel like you may hurt yourself or others, or have thoughts about taking your own life, get help right away. You can go to your nearest emergency department or call:  Your local emergency services (911 in the [place]  A suicide crisis helpline, such as the National Suicide Prevention Lifeline: ? 914-685-5230. This is open 24 hours a day. This information is not intended to replace advice given to you by your health care provider. Make sure you discuss any questions you have with your health care provider.  Document Released: 03/10/2015 Document Revised: 12/14/2015 Document Reviewed: 12/14/2015 Elsevier Interactive Patient Education  2019 [place].  Insomnia Insomnia is a sleep disorder that makes it difficult to fall asleep or stay asleep. Insomnia can cause fatigue, low energy, difficulty concentrating, mood swings, and poor performance at work or school. There are three different ways to classify insomnia:  Difficulty  falling asleep.  Difficulty staying asleep.  Waking up too early in the morning. Any type of insomnia can be long-term (chronic) or short-term (acute). Both are common. Short-term insomnia usually lasts for three months or less. Chronic insomnia occurs at least three times a week for longer than three months. What are the causes? Insomnia may be caused by another condition, situation, or substance, such as:  Anxiety.  Certain medicines.  Gastroesophageal reflux disease (GERD) or other gastrointestinal conditions.  Asthma or other breathing conditions.  Restless legs syndrome, sleep apnea, or other sleep disorders.  Chronic pain.  Menopause.  Stroke.  Abuse of alcohol, tobacco, or illegal drugs.  Mental health conditions, such as depression.  Caffeine.  Neurological disorders, such as Alzheimer's disease.  An overactive thyroid (hyperthyroidism). Sometimes, the cause of insomnia may not be known. What increases the risk? Risk factors for insomnia include:  Gender. Women are affected more often than men.  Age. Insomnia is more common as you get older.  Stress.  Lack of exercise.  Irregular work schedule or working night shifts.  Traveling between different time zones.  Certain medical and mental health conditions. What are the signs or symptoms? If you have insomnia, the main symptom is having trouble falling asleep or having trouble staying asleep. This may lead to other symptoms, such as:  Feeling fatigued or having low energy.  Feeling nervous about going to sleep.  Not feeling rested in the morning.  Having trouble concentrating.  Feeling irritable, anxious, or depressed. How is this diagnosed? This condition may be diagnosed based on:  Your symptoms and medical history. Your health care provider may ask about: ? Your sleep habits. ? Any medical conditions you have. ? Your mental health.  A physical exam. How is this treated? Treatment for  insomnia depends on the cause. Treatment may focus on treating an underlying condition that is causing insomnia. Treatment may also include:  Medicines to help you sleep.  Counseling or therapy.  Lifestyle adjustments to help you sleep better. Follow these instructions at home: Eating and drinking   Limit or avoid alcohol, caffeinated beverages, and cigarettes, especially close to bedtime. These can disrupt your sleep.  Do not eat a large meal or eat spicy foods right before bedtime. This can lead to digestive discomfort that can make it hard for you to sleep. Sleep habits   Keep a sleep diary to help you and your health care provider figure out what could be causing your insomnia. Write down: ? When you sleep. ? When you wake up during the night. ? How well you sleep. ? How rested you feel the next day. ? Any side effects of medicines you are taking. ? What you eat and drink.  Make your bedroom a dark, comfortable place where it is easy to fall asleep. ? Put up shades or blackout curtains to block light from outside. ? Use a white noise machine to block noise. ? Keep the temperature cool.  Limit screen use before bedtime. This includes: ? Watching TV. ? Using your smartphone, tablet, or computer.  Stick to a routine that includes going to bed  and waking up at the same times every day and night. This can help you fall asleep faster. Consider making a quiet activity, such as reading, part of your nighttime routine.  Try to avoid taking naps during the day so that you sleep better at night.  Get out of bed if you are still awake after 15 minutes of trying to sleep. Keep the lights down, but try reading or doing a quiet activity. When you feel sleepy, go back to bed. General instructions  Take over-the-counter and prescription medicines only as told by your health care provider.  Exercise regularly, as told by your health care provider. Avoid exercise starting several hours  before bedtime.  Use relaxation techniques to manage stress. Ask your health care provider to suggest some techniques that may work well for you. These may include: ? Breathing exercises. ? Routines to release muscle tension. ? Visualizing peaceful scenes.  Make sure that you drive carefully. Avoid driving if you feel very sleepy.  Keep all follow-up visits as told by your health care provider. This is important. Contact a health care provider if:  You are tired throughout the day.  You have trouble in your daily routine due to sleepiness.  You continue to have sleep problems, or your sleep problems get worse. Get help right away if:  You have serious thoughts about hurting yourself or someone else. If you ever feel like you may hurt yourself or others, or have thoughts about taking your own life, get help right away. You can go to your nearest emergency department or call:  Your local emergency services (911 in the [place]  A suicide crisis helpline, such as the [place] at 9142084243. This is open 24 hours a day. Summary  Insomnia is a sleep disorder that makes it difficult to fall asleep or stay asleep.  Insomnia can be long-term (chronic) or short-term (acute).  Treatment for insomnia depends on the cause. Treatment may focus on treating an underlying condition that is causing insomnia.  Keep a sleep diary to help you and your health care provider figure out what could be causing your insomnia. This information is not intended to replace advice given to you by your health care provider. Make sure you discuss any questions you have with your health care provider. Document Released: 03/26/2000 Document Revised: 01/06/2017 Document Reviewed: 01/06/2017 Elsevier Interactive Patient Education  2019 [place].     Migraine Headache A migraine headache is an intense, throbbing pain on one side or both sides of the head. Migraines may also  cause other symptoms, such as nausea, vomiting, and sensitivity to light and noise. What are the causes? Doing or taking certain things may also trigger migraines, such as:  Alcohol.  Smoking.  Medicines, such as: ? Medicine used to treat chest pain (nitroglycerine). ? Birth control pills. ? Estrogen pills. ? Certain blood pressure medicines.  Aged cheeses, chocolate, or caffeine.  Foods or drinks that contain nitrates, glutamate, aspartame, or tyramine.  Physical activity. Other things that may trigger a migraine include:  Menstruation.  Pregnancy.  Hunger.  Stress, lack of sleep, too much sleep, or fatigue.  Weather changes. What increases the risk? The following factors may make you more likely to experience migraine headaches:  Age. Risk increases with age.  Family history of migraine headaches.  Being Caucasian.  Depression and anxiety.  Obesity.  Being a woman.  Having a hole in the heart (patent foramen ovale) or other heart problems. What  are the signs or symptoms? The main symptom of this condition is pulsating or throbbing pain. Pain may:  Happen in any area of the head, such as on one side or both sides.  Interfere with daily activities.  Get worse with physical activity.  Get worse with exposure to bright lights or loud noises. Other symptoms may include:  Nausea.  Vomiting.  Dizziness.  General sensitivity to bright lights, loud noises, or smells. Before you get a migraine, you may get warning signs that a migraine is developing (aura). An aura may include:  Seeing flashing lights or having blind spots.  Seeing bright spots, halos, or zigzag lines.  Having tunnel vision or blurred vision.  Having numbness or a tingling feeling.  Having trouble talking.  Having muscle weakness. How is this diagnosed? A migraine headache can be diagnosed based on:  Your symptoms.  A physical exam.  Tests, such as CT scan or MRI of the  head. These imaging tests can help rule out other causes of headaches.  Taking fluid from the spine (lumbar puncture) and analyzing it (cerebrospinal fluid analysis, or CSF analysis). How is this treated? A migraine headache is usually treated with medicines that:  Relieve pain.  Relieve nausea.  Prevent migraines from coming back. Treatment may also include:  Acupuncture.  Lifestyle changes like avoiding foods that trigger migraines. Follow these instructions at home: Medicines  Take over-the-counter and prescription medicines only as told by your health care provider.  Do not drive or use heavy machinery while taking prescription pain medicine.  To prevent or treat constipation while you are taking prescription pain medicine, your health care provider may recommend that you: ? Drink enough fluid to keep your urine clear or pale yellow. ? Take over-the-counter or prescription medicines. ? Eat foods that are high in fiber, such as fresh fruits and vegetables, whole grains, and beans. ? Limit foods that are high in fat and processed sugars, such as fried and sweet foods. Lifestyle  Avoid alcohol use.  Do not use any products that contain nicotine or tobacco, such as cigarettes and e-cigarettes. If you need help quitting, ask your health care provider.  Get at least 8 hours of sleep every night.  Limit your stress. General instructions      Keep a journal to find out what may trigger your migraine headaches. For example, write down: ? What you eat and drink. ? How much sleep you get. ? Any change to your diet or medicines.  If you have a migraine: ? Avoid things that make your symptoms worse, such as bright lights. ? It may help to lie down in a dark, quiet room. ? Do not drive or use heavy machinery. ? Ask your health care provider what activities are safe for you while you are experiencing symptoms.  Keep all follow-up visits as told by your health care provider.  This is important. Contact a health care provider if:  You develop symptoms that are different or more severe than your usual migraine symptoms. Get help right away if:  Your migraine becomes severe.  You have a fever.  You have a stiff neck.  You have vision loss.  Your muscles feel weak or like you cannot control them.  You start to lose your balance often.  You develop trouble walking.  You faint. This information is not intended to replace advice given to you by your health care provider. Make sure you discuss any questions you have with your health  care provider. Document Released: 03/29/2005 Document Revised: 10/17/2015 Document Reviewed: 09/15/2015 Elsevier Interactive Patient Education  2019 [employer].

## 2018-07-20 NOTE — Telephone Encounter (Signed)
Noted. FYi to provider pt has tele med appointment today at 240

## 2018-07-26 ENCOUNTER — Telehealth (INDEPENDENT_AMBULATORY_CARE_PROVIDER_SITE_OTHER): Payer: BLUE CROSS/BLUE SHIELD | Admitting: Family Medicine

## 2018-07-26 ENCOUNTER — Encounter: Payer: Self-pay | Admitting: Family Medicine

## 2018-07-26 ENCOUNTER — Other Ambulatory Visit: Payer: Self-pay

## 2018-07-26 DIAGNOSIS — F339 Major depressive disorder, recurrent, unspecified: Secondary | ICD-10-CM | POA: Diagnosis not present

## 2018-07-26 DIAGNOSIS — G4700 Insomnia, unspecified: Secondary | ICD-10-CM | POA: Diagnosis not present

## 2018-07-26 NOTE — Patient Instructions (Addendum)
Continue same dose of wellbutrin, and hydroxyzine if needed at night. Please contact counselor. One option is provided below.   [facility]: [phone]  Recheck in 1 week.   Return to the clinic or go to the nearest emergency room if any of your symptoms worsen or new symptoms occur.

## 2018-07-26 NOTE — Progress Notes (Signed)
Virtual Visit via Telephone Note  I connected with Lataria Courser on 07/26/18 at 10:22 AM by telephone and verified that I am speaking with the correct person using two identifiers.   I discussed the limitations, risks, security and privacy concerns of performing an evaluation and management service by telephone and the availability of in person appointments. I also discussed with the patient that there may be a patient responsible charge related to this service. The patient expressed understanding and agreed to proceed, consent obtained  Chief complaint: depression  History of Present Illness: Depression: Discussed April 9.  See details of that visit, specifically had increased depression symptoms from baseline depression from meeting with Dr. Nolon Rod in August 2019.  She had been continued Wellbutrin XL 150 mg daily.  Some difficulty with sleep, disordered sleep likely due to snacking as well during the night, headaches thought to be migraines with recurrent visits to urgent care/ER.  See prior visits.plan for follow up with HA specialist as well.   We discussed improve sleep hygiene, minimizing snacking at night, started on hydroxyzine for bedtime as needed, Wellbutrin increased to 300 mg daily, recommended counseling, out of work note for 1 week.  Has been doing better on higher dose of wellbutrin. Less depressed. No SI. Has been using hydroxyzine for sleep. Less wakening, snacking. No new side effects of either med.  Has not called counselor yet or HA wellness center - number provided. Still some decreased motivation. Not ready to RTW yet - would like to meet with counsleor  Depression screen [facility] 2/9 07/26/2018 07/20/2018 12/03/2017 11/26/2017 10/03/2015  Decreased Interest 1 3 1  0 0  Down, Depressed, Hopeless 1 3 1  0 0  PHQ - 2 Score 2 6 2  0 0  Altered sleeping 1 3 3  - -  Tired, decreased energy 1 3 1  - -  Change in appetite 1 3 3  - -  Feeling bad or failure about yourself  3 2 3  - -   Trouble concentrating 0 0 0 - -  Moving slowly or fidgety/restless 2 1 0 - -  Suicidal thoughts 0 1 1 - -  PHQ-9 Score 10 19 13  - -  Difficult doing work/chores - Extremely dIfficult Not difficult at all - -      Patient Active Problem List   Diagnosis Date Noted  . Post-operative state 07/25/2017  . Blood loss anemia 03/05/2017  . BV (bacterial vaginosis) 11/27/2012  . UTI (lower urinary tract infection) 11/27/2012  . Migraine with visual aura 09/19/2011  . Depression 09/19/2011  . Constipation 06/25/2011   Past Medical History:  Diagnosis Date  . Anemia   . Celiac disease   . Elevated blood pressure reading   . Family history of adverse reaction to anesthesia    MOM-HARD TIME WAKING UP  . GERD (gastroesophageal reflux disease)   . Migraine with visual aura    MIGRAINES  . UTI (lower urinary tract infection)    Past Surgical History:  Procedure Laterality Date  . BLADDER SUSPENSION    . EXPLORATORY LAPAROTOMY    . IUD REMOVAL  07/25/2017   Procedure: INTRAUTERINE DEVICE (IUD) REMOVAL;  Surgeon: Harlin Heys, MD;  Location: [facility] ORS;  Service: Gynecology;;  . LAPAROSCOPIC ASSISTED VAGINAL HYSTERECTOMY Bilateral 07/25/2017   Procedure: LAPAROSCOPIC ASSISTED VAGINAL HYSTERECTOMY WITH BILATERAL [place] OOPHERECTOMY;  Surgeon: Harlin Heys, MD;  Location: [facility] ORS;  Service: Gynecology;  Laterality: Bilateral;  . TUBAL LIGATION     Allergies  Allergen Reactions  .  Gluten Meal Diarrhea and Nausea Only       . Ondansetron Other (See Comments)    Helps her nausea, but makes HA worse   Prior to Admission medications   Medication Sig Start Date End Date Taking? Authorizing Provider  albuterol (PROVENTIL HFA;VENTOLIN HFA) 108 (90 Base) MCG/ACT inhaler Inhale 2 puffs into the lungs every 4 (four) hours as needed for wheezing or shortness of breath (cough, shortness of breath or wheezing.). 07/04/18  Yes Robyn Haber, MD  buPROPion (WELLBUTRIN XL) 300 MG 24 hr  tablet Take 1 tablet (300 mg total) by mouth daily. 07/20/18  Yes Wendie Agreste, MD  cetirizine (ZYRTEC) 10 MG tablet Take 1 tablet (10 mg total) by mouth daily. 06/28/18  Yes Yu, Amy V, PA-C  hydrOXYzine (ATARAX/VISTARIL) 25 MG tablet Take 0.5-1 tablets (12.5-25 mg total) by mouth at bedtime as needed (for sleep.). 07/20/18  Yes Wendie Agreste, MD  ketorolac (TORADOL) 10 MG tablet Take 1 tablet (10 mg total) by mouth every 6 (six) hours as needed. 07/17/18  Yes Melynda Ripple, MD  omeprazole (PRILOSEC) 20 MG capsule TAKE 1 CAPSULE BY MOUTH EVERY DAY 06/02/18  Yes Stallings, Zoe A, MD  fluticasone (FLONASE) 50 MCG/ACT nasal spray Place 2 sprays into both nostrils daily. Patient not taking: Reported on 07/20/2018 06/28/18   Ok Edwards, PA-C  promethazine (PHENERGAN) 25 MG suppository Place 1 suppository (25 mg total) rectally every 6 (six) hours as needed for nausea or vomiting. Patient not taking: Reported on 07/26/2018 06/29/18   Nat Christen, MD   Social History   Socioeconomic History  . Marital status: Married    Spouse name: Jenny Reichmann  . Number of children: 3  . Years of education: 52  . Highest education level: Not on file  Occupational History  . Occupation: Personnel officer  Social Needs  . Financial resource strain: Not on file  . Food insecurity:    Worry: Not on file    Inability: Not on file  . Transportation needs:    Medical: Not on file    Non-medical: Not on file  Tobacco Use  . Smoking status: Never Smoker  . Smokeless tobacco: Never Used  Substance and Sexual Activity  . Alcohol use: No    Alcohol/week: 0.0 standard drinks  . Drug use: No  . Sexual activity: Yes    Partners: Female    Birth control/protection: Surgical    Comment: INTERCOURSE AGE 69, SEXUAL PARTNERS LEES THAN 5  Lifestyle  . Physical activity:    Days per week: Not on file    Minutes per session: Not on file  . Stress: Not on file  Relationships  . Social connections:    Talks on  phone: Not on file    Gets together: Not on file    Attends religious service: Not on file    Active member of club or organization: Not on file    Attends meetings of clubs or organizations: Not on file    Relationship status: Not on file  . Intimate partner violence:    Fear of current or ex partner: Not on file    Emotionally abused: Not on file    Physically abused: Not on file    Forced sexual activity: Not on file  Other Topics Concern  . Not on file  Social History Narrative   Lives with her husband and their three children, and her daughter's boyfriend.  Her older son's daughter lives there part-time  as well.     Observations/Objective: No SI, normal responses on phone.  No distress.  Assessment and Plan: Episode of recurrent major depressive disorder, unspecified depression episode severity ([place])  Insomnia, unspecified type Improving, stressed importance of counseling, plans to contact today.  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  Continue Wellbutrin same dose, hydroxyzine at bedtime as needed. Rtc/Er precautions.   Follow Up Instructions: 1 week.    I discussed the assessment and treatment plan with the patient. The patient was provided an opportunity to ask questions and all were answered. The patient agreed with the plan and demonstrated an understanding of the instructions.   The patient was advised to call back or seek an in-person evaluation if the symptoms worsen or if the condition fails to improve as anticipated.  I provided 6 minutes of non-face-to-face time during this encounter.  Signed,   Merri Ray, MD [facility] [place].  07/26/18

## 2018-07-26 NOTE — Progress Notes (Signed)
Pt c/o depression follow up, she says she is a little better than she was. Score was a 10.

## 2018-08-02 ENCOUNTER — Telehealth (INDEPENDENT_AMBULATORY_CARE_PROVIDER_SITE_OTHER): Payer: BLUE CROSS/BLUE SHIELD | Admitting: Family Medicine

## 2018-08-02 DIAGNOSIS — G4700 Insomnia, unspecified: Secondary | ICD-10-CM

## 2018-08-02 DIAGNOSIS — F339 Major depressive disorder, recurrent, unspecified: Secondary | ICD-10-CM

## 2018-08-02 NOTE — Progress Notes (Signed)
Virtual Visit via Telephone Note  I connected with [name] on 08/02/18 at 8:36 AM by telephone and verified that I am speaking with the correct person using two identifiers.   I discussed the limitations, risks, security and privacy concerns of performing an evaluation and management service by telephone and the availability of in person appointments. I also discussed with the patient that there may be a patient responsible charge related to this service. The patient expressed understanding and agreed to proceed, consent obtained  Chief complaint:  depression  History of Present Illness:   Depression:  Depression screen [facility] 2/9 08/02/2018 07/26/2018 07/20/2018 12/03/2017 11/26/2017  Decreased Interest 1 1 3 1  0  Down, Depressed, Hopeless 1 1 3 1  0  PHQ - 2 Score 2 2 6 2  0  Altered sleeping 0 1 3 3  -  Tired, decreased energy 1 1 3 1  -  Change in appetite 0 1 3 3  -  Feeling bad or failure about yourself  0 3 2 3  -  Trouble concentrating 0 0 0 0 -  Moving slowly or fidgety/restless 0 2 1 0 -  Suicidal thoughts 0 0 1 1 -  PHQ-9 Score 3 10 19 13  -  Difficult doing work/chores Not difficult at all - Extremely dIfficult Not difficult at all -   Follow-up of depression.  Initial telemedicine visit on April 9 with increased depression symptoms at that time from baseline.  Difficulty with sleep/sleep hygiene.  Wellbutrin increased to 300 mg daily, recommended counseling, started on hydroxyzine for that time as needed and out of work note given.  Improving at follow-up tele-med visit April 15 with improved sleep, less wakening and snacking at bedtime.  Had not yet met with counselor or headache wellness center.  See prior visit regarding frequency of migraine/headaches.  Decided to continue same dose of Wellbutrin at that time, recommended follow-up with counseling.  Work note was extended.  Feeling a little better. Spoke to therapist since last visit - Dortch Mann.  Feels like meds are  working better. Felt like therapy session helped. Thinks that she was letting workers get to her more. Not sure she wants to go back to that job, but plans to stay in current job until headaches improving. Notes HA with returning to work. Therapist thought she may not be ready for RTW. Working on some issues at home as well. Appointment again tomorrow morning.  Prior work shifts were 8 hours. Not ready to return to work. Waiting on FMLA paperwork.   Continued on Wellbutrin and hydroxyzine.  PHQ score has significantly improved as above over the prior 2 visits  Patient Active Problem List   Diagnosis Date Noted   Post-operative state 07/25/2017   Blood loss anemia 03/05/2017   BV (bacterial vaginosis) 11/27/2012   UTI (lower urinary tract infection) 11/27/2012   Migraine with visual aura 09/19/2011   Depression 09/19/2011   Constipation 06/25/2011   Past Medical History:  Diagnosis Date   Anemia    Celiac disease    Elevated blood pressure reading    Family history of adverse reaction to anesthesia    MOM-HARD TIME WAKING UP   GERD (gastroesophageal reflux disease)    Migraine with visual aura    MIGRAINES   UTI (lower urinary tract infection)    Past Surgical History:  Procedure Laterality Date   BLADDER SUSPENSION     EXPLORATORY LAPAROTOMY     IUD REMOVAL  07/25/2017   Procedure: INTRAUTERINE DEVICE (IUD)  REMOVAL;  Surgeon: Harlin Heys, MD;  Location: [facility] ORS;  Service: Gynecology;;   LAPAROSCOPIC ASSISTED VAGINAL HYSTERECTOMY Bilateral 07/25/2017   Procedure: LAPAROSCOPIC ASSISTED VAGINAL HYSTERECTOMY WITH BILATERAL [place] OOPHERECTOMY;  Surgeon: Harlin Heys, MD;  Location: [facility] ORS;  Service: Gynecology;  Laterality: Bilateral;   TUBAL LIGATION     Allergies  Allergen Reactions   Gluten Meal Diarrhea and Nausea Only        Ondansetron Other (See Comments)    Helps her nausea, but makes HA worse   Prior to Admission medications     Medication Sig Start Date End Date Taking? Authorizing Provider  albuterol (PROVENTIL HFA;VENTOLIN HFA) 108 (90 Base) MCG/ACT inhaler Inhale 2 puffs into the lungs every 4 (four) hours as needed for wheezing or shortness of breath (cough, shortness of breath or wheezing.). 07/04/18  Yes Robyn Haber, MD  buPROPion (WELLBUTRIN XL) 300 MG 24 hr tablet Take 1 tablet (300 mg total) by mouth daily. 07/20/18  Yes Wendie Agreste, MD  hydrOXYzine (ATARAX/VISTARIL) 25 MG tablet Take 0.5-1 tablets (12.5-25 mg total) by mouth at bedtime as needed (for sleep.). 07/20/18  Yes Wendie Agreste, MD  ketorolac (TORADOL) 10 MG tablet Take 1 tablet (10 mg total) by mouth every 6 (six) hours as needed. 07/17/18  Yes Melynda Ripple, MD  omeprazole (PRILOSEC) 20 MG capsule TAKE 1 CAPSULE BY MOUTH EVERY DAY 06/02/18  Yes Forrest Moron, MD   Social History   Socioeconomic History   Marital status: Married    Spouse name: [name]   Number of children: 3   Years of education: 12   Highest education level: Not on file  Occupational History   Occupation: Tour manager strain: Not on file   Food insecurity:    Worry: Not on file    Inability: Not on file   Transportation needs:    Medical: Not on file    Non-medical: Not on file  Tobacco Use   Smoking status: Never Smoker   Smokeless tobacco: Never Used  Substance and Sexual Activity   Alcohol use: No    Alcohol/week: 0.0 standard drinks   Drug use: No   Sexual activity: Yes    Partners: Female    Birth control/protection: Surgical    Comment: INTERCOURSE AGE 53, SEXUAL PARTNERS LEES THAN 5  Lifestyle   Physical activity:    Days per week: Not on file    Minutes per session: Not on file   Stress: Not on file  Relationships   Social connections:    Talks on phone: Not on file    Gets together: Not on file    Attends religious service: Not on file    Active member of club or  organization: Not on file    Attends meetings of clubs or organizations: Not on file    Relationship status: Not on file   Intimate partner violence:    Fear of current or ex partner: Not on file    Emotionally abused: Not on file    Physically abused: Not on file    Forced sexual activity: Not on file  Other Topics Concern   Not on file  Social History Narrative   Lives with her husband and their three children, and her daughter's boyfriend.  Her older son's daughter lives there part-time as well.     Observations/Objective: Appropriate responses, no distress.  No suicidal ideation.  Assessment and Plan: Insomnia,  unspecified type  Episode of recurrent major depressive disorder, unspecified depression episode severity ([place]) Improving with current medication regimen of Wellbutrin at 300 mg daily, hydroxyzine at bedtime.  Still plan to follow-up with headache specialist.  Suspect some workplace anxiety contributing along with her underlying depression.    -Anticipate improvement with counseling.  Continue same dose Wellbutrin and hydroxyzine at this time.  FMLA paperwork pending, out of work 1 more week with option to return to shortened shifts if that may be feasible.  Can discuss further at follow-up in 1 week.    Follow Up Instructions: 1 week   Patient Instructions    No change in medication doses at this time.  I am glad to hear that you were able to meet with a therapist.  I think meeting with him and discussing the workplace stressors, and how to best return to work can be a focus this next week.  We have the option of returning to shortened shift if that would help but can discuss this further at follow-up next week.  Continue Wellbutrin the same dose, hydroxyzine same dose.  Schedule visit sooner if any worsening symptoms.  Good talking to you today. Take care.   If you have lab work done today you will be contacted with your lab results within the next 2 weeks.  If you  have not heard from [name] then please contact us. The fastest way to get your results is to register for My Chart.   IF you received an x-ray today, you will receive an invoice from [facility]. Please contact [facility] at [phone] with questions or concerns regarding your invoice.   IF you received labwork today, you will receive an invoice from [place]. Please contact [facility] at [phone] with questions or concerns regarding your invoice.   Our billing staff will not be able to assist you with questions regarding bills from these companies.  You will be contacted with the lab results as soon as they are available. The fastest way to get your results is to activate your My Chart account. Instructions are located on the last page of this paperwork. If you have not heard from [name] regarding the results in 2 weeks, please contact this office.          I discussed the assessment and treatment plan with the patient. The patient was provided an opportunity to ask questions and all were answered. The patient agreed with the plan and demonstrated an understanding of the instructions.   The patient was advised to call back or seek an in-person evaluation if the symptoms worsen or if the condition fails to improve as anticipated.  I provided 9 minutes of non-face-to-face time during this encounter.  Signed,   Merri Ray, MD [facility] [place].  08/02/18

## 2018-08-02 NOTE — Progress Notes (Signed)
CC- Depression-1 week f/u depression patient states she is doing better on the Wellbutrin xl.

## 2018-08-02 NOTE — Patient Instructions (Addendum)
  No change in medication doses at this time.  I am glad to hear that you were able to meet with a therapist.  I think meeting with him and discussing the workplace stressors, and how to best return to work can be a focus this next week.  We have the option of returning to shortened shift if that would help but can discuss this further at follow-up next week.  Continue Wellbutrin the same dose, hydroxyzine same dose.  Schedule visit sooner if any worsening symptoms.  Good talking to you today. Take care.   If you have lab work done today you will be contacted with your lab results within the next 2 weeks.  If you have not heard from [name] then please contact us. The fastest way to get your results is to register for My Chart.   IF you received an x-ray today, you will receive an invoice from [facility]. Please contact [facility] at [phone] with questions or concerns regarding your invoice.   IF you received labwork today, you will receive an invoice from [place]. Please contact [facility] at [phone] with questions or concerns regarding your invoice.   Our billing staff will not be able to assist you with questions regarding bills from these companies.  You will be contacted with the lab results as soon as they are available. The fastest way to get your results is to activate your My Chart account. Instructions are located on the last page of this paperwork. If you have not heard from [name] regarding the results in 2 weeks, please contact this office.

## 2018-08-03 ENCOUNTER — Other Ambulatory Visit: Payer: Self-pay | Admitting: Family Medicine

## 2018-08-03 DIAGNOSIS — F339 Major depressive disorder, recurrent, unspecified: Secondary | ICD-10-CM

## 2018-08-03 NOTE — Telephone Encounter (Signed)
Requesting 90 day supply with no refills to replace 30 day supply with 2 refills.

## 2018-08-07 ENCOUNTER — Telehealth: Payer: Self-pay | Admitting: Family Medicine

## 2018-08-07 NOTE — Telephone Encounter (Signed)
Pt dropped off FMLA paperwork. Pt states it needs to be done by May 4th if possible. Already paid FMLA fee. Placed in the providers box at nurses station.

## 2018-08-11 ENCOUNTER — Other Ambulatory Visit: Payer: Self-pay | Admitting: Family Medicine

## 2018-08-11 ENCOUNTER — Other Ambulatory Visit: Payer: Self-pay

## 2018-08-11 ENCOUNTER — Telehealth (INDEPENDENT_AMBULATORY_CARE_PROVIDER_SITE_OTHER): Payer: BLUE CROSS/BLUE SHIELD | Admitting: Family Medicine

## 2018-08-11 DIAGNOSIS — F339 Major depressive disorder, recurrent, unspecified: Secondary | ICD-10-CM

## 2018-08-11 DIAGNOSIS — G4700 Insomnia, unspecified: Secondary | ICD-10-CM

## 2018-08-11 MED ORDER — HYDROXYZINE HCL 25 MG PO TABS: 12.5000 mg | ORAL_TABLET | Freq: Every evening | ORAL | 0 refills | Status: DC | PRN

## 2018-08-11 NOTE — Progress Notes (Signed)
Virtual Visit via Telephone Note  I connected with [name] on 08/11/18 at 5:17 PM by telephone and verified that I am speaking with the correct person using two identifiers.   I discussed the limitations, risks, security and privacy concerns of performing an evaluation and management service by telephone and the availability of in person appointments. I also discussed with the patient that there may be a patient responsible charge related to this service. The patient expressed understanding and agreed to proceed, consent obtained  Chief complaint:  Depression   History of Present Illness: [name] is a 53 y.o. female  Follow up of depression. See prior notes.   Depression:  Depression screen [facility] 2/9 08/11/2018 08/02/2018 07/26/2018 07/20/2018 12/03/2017  Decreased Interest 1 1 1 3 1   Down, Depressed, Hopeless 0 1 1 3 1   PHQ - 2 Score 1 2 2 6 2   Altered sleeping - 0 1 3 3   Tired, decreased energy - 1 1 3 1   Change in appetite - 0 1 3 3   Feeling bad or failure about yourself  - 0 3 2 3   Trouble concentrating - 0 0 0 0  Moving slowly or fidgety/restless - 0 2 1 0  Suicidal thoughts - 0 0 1 1  PHQ-9 Score - 3 10 19 13   Difficult doing work/chores - Not difficult at all - Extremely dIfficult Not difficult at all   Doing better.  Still doing ok on wellbutrin 368m, and hydroxyzine at bedtime - helping for sleep - full pill. Not groggy or daytime sleepiness.  No suicide thoughts.   Meeting with therapist once per week. - had meeting this morning. - ?3-4 visits.  Work situation is biggest issue, some stress with kids, but primarily work place.  Feels like a robot at work.  No safety concerns. Likes her job, but can't handle working around the people at her job. Has discussed concerns with supervisor a while ago. No recent discussion and has not discussed with HR.  Feel like returning part time would be worse due to coworkers. Uncomfortable.  Looking into other work opportunities.  Interviewing with potential new employer next week.   Plans on submitting notice with current job at this point. Feels like everything is going to be worse with return to work. Has used sick time.      Headaches: Improved - minor ones only. Minor headaches. No migraines since approx 4/12. Has not met with headache specialsist.       Patient Active Problem List   Diagnosis Date Noted  . Post-operative state 07/25/2017  . Blood loss anemia 03/05/2017  . BV (bacterial vaginosis) 11/27/2012  . UTI (lower urinary tract infection) 11/27/2012  . Migraine with visual aura 09/19/2011  . Depression 09/19/2011  . Constipation 06/25/2011   Past Medical History:  Diagnosis Date  . Anemia   . Celiac disease   . Elevated blood pressure reading   . Family history of adverse reaction to anesthesia    MOM-HARD TIME WAKING UP  . GERD (gastroesophageal reflux disease)   . Migraine with visual aura    MIGRAINES  . UTI (lower urinary tract infection)    Past Surgical History:  Procedure Laterality Date  . BLADDER SUSPENSION    . EXPLORATORY LAPAROTOMY    . IUD REMOVAL  07/25/2017   Procedure: INTRAUTERINE DEVICE (IUD) REMOVAL;  Surgeon: EHarlin Heys MD;  Location: [facility] ORS;  Service: Gynecology;;  . LAPAROSCOPIC ASSISTED VAGINAL HYSTERECTOMY Bilateral 07/25/2017  Procedure: LAPAROSCOPIC ASSISTED VAGINAL HYSTERECTOMY WITH BILATERAL [place] OOPHERECTOMY;  Surgeon: Harlin Heys, MD;  Location: [facility] ORS;  Service: Gynecology;  Laterality: Bilateral;  . TUBAL LIGATION     Allergies  Allergen Reactions  . Gluten Meal Diarrhea and Nausea Only       . Ondansetron Other (See Comments)    Helps her nausea, but makes HA worse   Prior to Admission medications   Medication Sig Start Date End Date Taking? Authorizing Provider  albuterol (PROVENTIL HFA;VENTOLIN HFA) 108 (90 Base) MCG/ACT inhaler Inhale 2 puffs into the lungs every 4 (four) hours as needed for wheezing or shortness of  breath (cough, shortness of breath or wheezing.). 07/04/18  Yes Robyn Haber, MD  buPROPion (WELLBUTRIN XL) 300 MG 24 hr tablet TAKE 1 TABLET BY MOUTH EVERY DAY 08/03/18  Yes Wendie Agreste, MD  hydrOXYzine (ATARAX/VISTARIL) 25 MG tablet Take 0.5-1 tablets (12.5-25 mg total) by mouth at bedtime as needed (for sleep.). 07/20/18  Yes Wendie Agreste, MD  omeprazole (PRILOSEC) 20 MG capsule TAKE 1 CAPSULE BY MOUTH EVERY DAY 06/02/18  Yes Forrest Moron, MD   Social History   Socioeconomic History  . Marital status: Married    Spouse name: Jenny Reichmann  . Number of children: 3  . Years of education: 66  . Highest education level: Not on file  Occupational History  . Occupation: Personnel officer  Social Needs  . Financial resource strain: Not on file  . Food insecurity:    Worry: Not on file    Inability: Not on file  . Transportation needs:    Medical: Not on file    Non-medical: Not on file  Tobacco Use  . Smoking status: Never Smoker  . Smokeless tobacco: Never Used  Substance and Sexual Activity  . Alcohol use: No    Alcohol/week: 0.0 standard drinks  . Drug use: No  . Sexual activity: Yes    Partners: Female    Birth control/protection: Surgical    Comment: INTERCOURSE AGE 24, SEXUAL PARTNERS LEES THAN 5  Lifestyle  . Physical activity:    Days per week: Not on file    Minutes per session: Not on file  . Stress: Not on file  Relationships  . Social connections:    Talks on phone: Not on file    Gets together: Not on file    Attends religious service: Not on file    Active member of club or organization: Not on file    Attends meetings of clubs or organizations: Not on file    Relationship status: Not on file  . Intimate partner violence:    Fear of current or ex partner: Not on file    Emotionally abused: Not on file    Physically abused: Not on file    Forced sexual activity: Not on file  Other Topics Concern  . Not on file  Social History Narrative    Lives with her husband and their three children, and her daughter's boyfriend.  Her older son's daughter lives there part-time as well.     Observations/Objective: No distress on phone, appropriate responses.  Assessment and Plan: Recurrent major depressive disorder, remission status unspecified ([facility])  Insomnia, unspecified type - Plan: hydrOXYzine (ATARAX/VISTARIL) 25 MG tablet  History of migraine  Depression symptoms are improving, tolerating current dose of Wellbutrin.  Sleep is also improved with use of hydroxyzine.  Has noted a significant decrease in headaches as well and only minimal headaches  infrequently at this time, not to the degree of migraine.  I suspect depression/stress symptoms were contributing as well as disordered sleep.  Workplace stress seems to still be a significant component.  She is deciding on whether to return to that job, but has been discussing these issues with her therapist.  -Continue same dose of Wellbutrin, hydroxyzine at this time.  -As headaches have significantly improved, has deferred meeting with headache specialist at this point  -Out of work through 1 additional week with planned return to work on May 11, but can be discussed further at follow-up in 1 week.  At that point will have met with therapist again.   -Return visit precautions given if worsening symptoms.  Understanding expressed.    Follow Up Instructions:  1 week.    I discussed the assessment and treatment plan with the patient. The patient was provided an opportunity to ask questions and all were answered. The patient agreed with the plan and demonstrated an understanding of the instructions.   The patient was advised to call back or seek an in-person evaluation if the symptoms worsen or if the condition fails to improve as anticipated.  I provided 21 minutes of non-face-to-face time during this encounter.  Signed,   Merri Ray, MD [facility] at [place].  08/11/18

## 2018-08-11 NOTE — Progress Notes (Signed)
CC-1 wk f/u Depression- Patient stated she is doing better. She was increase to 2 tabs a day on her Wellbutrin. Patient also need a refill on hydroxyzine. Its a message sent to you by [facility]. Also I sent you a message that this patient have FMLA forms in your box for you to fill out and sign. I filled out what I could.

## 2018-08-11 NOTE — Telephone Encounter (Signed)
I filled out FMLA best of my knowledge not sure if it is right this is my first time filling out. Form is in your box ready to be review and fill out what you need to fill out. Form is in your mailbox in your office.

## 2018-08-11 NOTE — Patient Instructions (Addendum)
  I am glad to hear the headaches have improved, sleep is improved and depression symptoms are improving.  Continue to meet with therapist.  I will extend out of work note through next week, with planned return to work on May 11th (depending on discussions next week).  No change in medications for now. Please discuss concerns with the workplace further with your therapist, but I would recommend discussing any concerns of the workplace with your human resource department. Talk to you further next week, but let me know if there are questions in the meantime.  I will complete the FMLA and disability paperwork.   If you have lab work done today you will be contacted with your lab results within the next 2 weeks.  If you have not heard from [name] then please contact us. The fastest way to get your results is to register for My Chart.   IF you received an x-ray today, you will receive an invoice from [facility]. Please contact [facility] at [phone] with questions or concerns regarding your invoice.   IF you received labwork today, you will receive an invoice from [place]. Please contact [facility] at [phone] with questions or concerns regarding your invoice.   Our billing staff will not be able to assist you with questions regarding bills from these companies.  You will be contacted with the lab results as soon as they are available. The fastest way to get your results is to activate your My Chart account. Instructions are located on the last page of this paperwork. If you have not heard from [name] regarding the results in 2 weeks, please contact this office.

## 2018-08-11 NOTE — Telephone Encounter (Signed)
Requested medication (s) are due for refill today: yes  Requested medication (s) are on the active medication list: yes  Last refill:  07/20/18  Future visit scheduled: Today with Dr Carlota Raspberry  Notes to clinic: future appointment Waunita Schooner MD [facility]? Need Dx code    Requested Prescriptions  Pending Prescriptions Disp Refills   hydrOXYzine (ATARAX/VISTARIL) 25 MG tablet [Pharmacy Med Name: HYDROXYZINE HCL 25 MG TABLET] 90 tablet 1    Sig: Take 0.5-1 tablets (12.5-25 mg total) by mouth at bedtime as needed (for sleep.).     Ear, Nose, and Throat:  Antihistamines Passed - 08/11/2018  8:36 AM      Passed - Valid encounter within last 12 months    Recent Outpatient Visits          8 months ago Moderate major depression ([place])   [facility] [place], MD   8 months ago Health maintenance examination   [facility] [place], MD   2 years ago Post-viral cough syndrome   [facility] [place] [facility], [place], [place]   3 years ago Urinary frequency   [facility] [place], PA-C   4 years ago Frequent urination   [facility] [place], MD      Future Appointments            In 1 month Einar Pheasant, Jobe Marker, MD [employer] at [place], [facility]

## 2018-08-11 NOTE — Telephone Encounter (Signed)
Okay to refill if so the DX code is needed on the script

## 2018-08-18 ENCOUNTER — Other Ambulatory Visit: Payer: Self-pay

## 2018-08-18 ENCOUNTER — Telehealth (INDEPENDENT_AMBULATORY_CARE_PROVIDER_SITE_OTHER): Payer: BLUE CROSS/BLUE SHIELD | Admitting: Family Medicine

## 2018-08-18 DIAGNOSIS — F418 Other specified anxiety disorders: Secondary | ICD-10-CM | POA: Diagnosis not present

## 2018-08-18 DIAGNOSIS — R519 Headache, unspecified: Secondary | ICD-10-CM

## 2018-08-18 DIAGNOSIS — R51 Headache: Secondary | ICD-10-CM | POA: Diagnosis not present

## 2018-08-18 DIAGNOSIS — F339 Major depressive disorder, recurrent, unspecified: Secondary | ICD-10-CM | POA: Diagnosis not present

## 2018-08-18 NOTE — Progress Notes (Signed)
Virtual Visit via Telephone Note  I connected with [name] on 08/18/18 at 10:18 AM by telephone and verified that I am speaking with the correct person using two identifiers.   I discussed the limitations, risks, security and privacy concerns of performing an evaluation and management service by telephone and the availability of in person appointments. I also discussed with the patient that there may be a patient responsible charge related to this service. The patient expressed understanding and agreed to proceed, consent obtained  Chief complaint: depression  History of Present Illness:  [name] is a 53 y.o. female  Depression:  Depression screen [facility] 2/9 08/18/2018 08/11/2018 08/02/2018 07/26/2018 07/20/2018  Decreased Interest 0 1 1 1 3   Down, Depressed, Hopeless 0 0 1 1 3   PHQ - 2 Score 0 1 2 2 6   Altered sleeping 1 - 0 1 3  Tired, decreased energy 0 - 1 1 3   Change in appetite 0 - 0 1 3  Feeling bad or failure about yourself  0 - 0 3 2  Trouble concentrating 0 - 0 0 0  Moving slowly or fidgety/restless 0 - 0 2 1  Suicidal thoughts 0 - 0 0 1  PHQ-9 Score 1 - 3 10 19   Difficult doing work/chores Not difficult at all - Not difficult at all - Extremely dIfficult  See prior notes.  Recurrent depression with worsening symptoms including difficulty with sleep, increasing headaches, decreased motivation for 4 to 5 weeks prior to April 9 telemedicine visit.  Sleep hygiene was reviewed, but was also prescribed hydroxyzine that has helped with sleep, Wellbutrin was increased to 300 mg/day that also has helped with depression, and referred to psychologist.  Has been meeting with psychologist/therapist that has been helpful in identifying her primary stressors, work and being a main stressor.  Some stressors at home but primarily work has been the primary issue.  She has not been ready to return to work on prior discussions including last week although has had some improvement in  depression.  Headaches have also been improving.  Out of work was extended through this upcoming Monday depending on today's visit. Did recommend discussing workplace concerns with her HR representative at work.   Sleep better with hydroxyzine 6m at night.  Depression is still doing much better.  One headache last weekend - increased anxiety - took xanax once.  Still having difficulty with thought of returning to work.  Met with therapist yesterday, and agreed that returning to current job may be detrimental at this time but her choice to make. Thought that return to her current job could be worsening depression symptoms. Has not resigned from current job yet, but considering turing in her notice. Does not plan to meet with HR as thinks that will make things worse.    Patient Active Problem List   Diagnosis Date Noted   Post-operative state 07/25/2017   Blood loss anemia 03/05/2017   BV (bacterial vaginosis) 11/27/2012   UTI (lower urinary tract infection) 11/27/2012   Migraine with visual aura 09/19/2011   Depression 09/19/2011   Constipation 06/25/2011   Past Medical History:  Diagnosis Date   Anemia    Celiac disease    Elevated blood pressure reading    Family history of adverse reaction to anesthesia    MOM-HARD TIME WAKING UP   GERD (gastroesophageal reflux disease)    Migraine with visual aura    MIGRAINES   UTI (lower urinary tract infection)  Past Surgical History:  Procedure Laterality Date   BLADDER SUSPENSION     EXPLORATORY LAPAROTOMY     IUD REMOVAL  07/25/2017   Procedure: INTRAUTERINE DEVICE (IUD) REMOVAL;  Surgeon: Harlin Heys, MD;  Location: [facility] ORS;  Service: Gynecology;;   LAPAROSCOPIC ASSISTED VAGINAL HYSTERECTOMY Bilateral 07/25/2017   Procedure: LAPAROSCOPIC ASSISTED VAGINAL HYSTERECTOMY WITH BILATERAL [place] OOPHERECTOMY;  Surgeon: Harlin Heys, MD;  Location: [facility] ORS;  Service: Gynecology;  Laterality: Bilateral;    TUBAL LIGATION     Allergies  Allergen Reactions   Gluten Meal Diarrhea and Nausea Only        Ondansetron Other (See Comments)    Helps her nausea, but makes HA worse   Prior to Admission medications   Medication Sig Start Date End Date Taking? Authorizing Provider  albuterol (PROVENTIL HFA;VENTOLIN HFA) 108 (90 Base) MCG/ACT inhaler Inhale 2 puffs into the lungs every 4 (four) hours as needed for wheezing or shortness of breath (cough, shortness of breath or wheezing.). 07/04/18  Yes Robyn Haber, MD  buPROPion (WELLBUTRIN XL) 300 MG 24 hr tablet TAKE 1 TABLET BY MOUTH EVERY DAY 08/03/18  Yes Wendie Agreste, MD  hydrOXYzine (ATARAX/VISTARIL) 25 MG tablet Take 0.5-1 tablets (12.5-25 mg total) by mouth at bedtime as needed (for sleep.). 08/11/18  Yes Wendie Agreste, MD  omeprazole (PRILOSEC) 20 MG capsule TAKE 1 CAPSULE BY MOUTH EVERY DAY 06/02/18  Yes Forrest Moron, MD   Social History   Socioeconomic History   Marital status: Married    Spouse name: [name]   Number of children: 3   Years of education: 12   Highest education level: Not on file  Occupational History   Occupation: Tour manager strain: Not on file   Food insecurity:    Worry: Not on file    Inability: Not on file   Transportation needs:    Medical: Not on file    Non-medical: Not on file  Tobacco Use   Smoking status: Never Smoker   Smokeless tobacco: Never Used  Substance and Sexual Activity   Alcohol use: No    Alcohol/week: 0.0 standard drinks   Drug use: No   Sexual activity: Yes    Partners: Female    Birth control/protection: Surgical    Comment: INTERCOURSE AGE 89, SEXUAL PARTNERS LEES THAN 5  Lifestyle   Physical activity:    Days per week: Not on file    Minutes per session: Not on file   Stress: Not on file  Relationships   Social connections:    Talks on phone: Not on file    Gets together: Not on file     Attends religious service: Not on file    Active member of club or organization: Not on file    Attends meetings of clubs or organizations: Not on file    Relationship status: Not on file   Intimate partner violence:    Fear of current or ex partner: Not on file    Emotionally abused: Not on file    Physically abused: Not on file    Forced sexual activity: Not on file  Other Topics Concern   Not on file  Social History Narrative   Lives with her husband and their three children, and her daughter's boyfriend.  Her older son's daughter lives there part-time as well.     Observations/Objective: Appropriate responses, no distress.  No SI/HI  Assessment and Plan:  Episode of recurrent major depressive disorder, unspecified depression episode severity (HCC)  Nonintractable episodic headache, unspecified headache type  Situational anxiety  Depression improved, tolerating current regimen. Sleep improved, less headaches. Still some significant anxiety regarding work and has discussed with therapist. Not thought to be appropriate time to return at this point.   - out of work for additional 2 weeks. Continue counseling.   - option of hydroxyzine during day if needed for anxiety flare.   - if return of frequent HA, would recommend appointment with HA specialist as previously planned.    Follow Up Instructions:   2 weeks.   Patient Instructions   Glad to hear the depression symptoms are improving, sleep is improved.  Continue to meet with your therapist.  I think that an additional 2 weeks off work is reasonable given recommendations from therapist yesterday and your current symptoms but can revisit that issue at follow-up appointment in 2 weeks.  Continue Wellbutrin same dose, hydroxyzine at bedtime, and can be used during the day if needed 1/2 to 1 pill every 6 hours for any situational anxiety.  If that is needed frequently, let me know as we may need to change medications.  If headaches  are continuing to return, I would recommend follow-up with headache specialist.  Let me know.  Take care.     If you have lab work done today you will be contacted with your lab results within the next 2 weeks.  If you have not heard from [name] then please contact us. The fastest way to get your results is to register for My Chart.   IF you received an x-ray today, you will receive an invoice from [facility]. Please contact [facility] at [phone] with questions or concerns regarding your invoice.   IF you received labwork today, you will receive an invoice from [facility]. Please contact [facility] at [phone] with questions or concerns regarding your invoice.   Our billing staff will not be able to assist you with questions regarding bills from these companies.  You will be contacted with the lab results as soon as they are available. The fastest way to get your results is to activate your My Chart account. Instructions are located on the last page of this paperwork. If you have not heard from [name] regarding the results in 2 weeks, please contact this office.        I discussed the assessment and treatment plan with the patient. The patient was provided an opportunity to ask questions and all were answered. The patient agreed with the plan and demonstrated an understanding of the instructions.   The patient was advised to call back or seek an in-person evaluation if the symptoms worsen or if the condition fails to improve as anticipated.  I provided 12 minutes of non-face-to-face time during this encounter.  Signed,   Merri Ray, MD [facility].  08/18/18

## 2018-08-18 NOTE — Progress Notes (Signed)
Depression F/u  PHQ9= 1  Meds are helping

## 2018-08-18 NOTE — Patient Instructions (Addendum)
Glad to hear the depression symptoms are improving, and sleep is improved.  Continue to meet with your therapist.  I think that an additional 2 weeks off work is reasonable given recommendations from therapist yesterday and your current symptoms but can revisit that issue at follow-up appointment in 2 weeks.  Continue Wellbutrin same dose, hydroxyzine at bedtime, and can be used during the day if needed 1/2 to 1 pill every 6 hours for any situational anxiety.  If that is needed frequently, let me know as we may need to change medications.  If headaches are continuing to return, I would recommend follow-up with headache specialist.  Let me know.  Take care.     If you have lab work done today you will be contacted with your lab results within the next 2 weeks.  If you have not heard from [name] then please contact us. The fastest way to get your results is to register for My Chart.   IF you received an x-ray today, you will receive an invoice from [facility]. Please contact [facility] at [phone] with questions or concerns regarding your invoice.   IF you received labwork today, you will receive an invoice from [facility]. Please contact [facility] at [phone] with questions or concerns regarding your invoice.   Our billing staff will not be able to assist you with questions regarding bills from these companies.  You will be contacted with the lab results as soon as they are available. The fastest way to get your results is to activate your My Chart account. Instructions are located on the last page of this paperwork. If you have not heard from [name] regarding the results in 2 weeks, please contact this office.

## 2018-08-23 NOTE — Telephone Encounter (Signed)
Yes- completed and discussed with Gina yesterday.

## 2018-08-23 NOTE — Telephone Encounter (Signed)
Is this paperwork completed?

## 2018-08-23 NOTE — Telephone Encounter (Signed)
Pt called to follow up and see if FMLA paperwork has been completed and sent back. Would like a return call. Please advise. CB#[phone]

## 2018-08-24 ENCOUNTER — Encounter (HOSPITAL_COMMUNITY): Payer: Self-pay

## 2018-08-24 ENCOUNTER — Ambulatory Visit (HOSPITAL_COMMUNITY)
Admission: EM | Admit: 2018-08-24 | Discharge: 2018-08-24 | Disposition: A | Discharge status: Home / Self Care | Payer: BLUE CROSS/BLUE SHIELD | Attending: Family Medicine | Admitting: Family Medicine

## 2018-08-24 ENCOUNTER — Ambulatory Visit: Payer: BLUE CROSS/BLUE SHIELD | Admitting: Family Medicine

## 2018-08-24 ENCOUNTER — Other Ambulatory Visit: Payer: Self-pay

## 2018-08-24 DIAGNOSIS — G43019 Migraine without aura, intractable, without status migrainosus: Secondary | ICD-10-CM | POA: Diagnosis not present

## 2018-08-24 MED ORDER — KETOROLAC TROMETHAMINE 60 MG/2ML IM SOLN
60.0000 mg | Freq: Once | INTRAMUSCULAR | Status: AC
  Administered 2018-08-24: 60 mg via INTRAMUSCULAR

## 2018-08-24 MED ORDER — DEXAMETHASONE SODIUM PHOSPHATE 10 MG/ML IJ SOLN
INTRAMUSCULAR | Status: AC
  Filled 2018-08-24: qty 1

## 2018-08-24 MED ORDER — DEXAMETHASONE SODIUM PHOSPHATE 10 MG/ML IJ SOLN
10.0000 mg | Freq: Once | INTRAMUSCULAR | Status: AC
  Administered 2018-08-24: 10 mg via INTRAMUSCULAR

## 2018-08-24 MED ORDER — KETOROLAC TROMETHAMINE 60 MG/2ML IM SOLN
INTRAMUSCULAR | Status: AC
  Filled 2018-08-24: qty 2

## 2018-08-24 NOTE — Discharge Instructions (Signed)
You have been given the following medications for your migraine headache: Meds ordered this encounter  Medications   ketorolac (TORADOL) injection 60 mg   dexamethasone (DECADRON) injection 10 mg

## 2018-08-24 NOTE — ED Triage Notes (Signed)
Pt present to urgent care with complaint of migraine headache that started last night. Pt tried OTC medication with no relief.

## 2018-08-24 NOTE — ED Provider Notes (Signed)
[place]   [id number] 08/24/18 Arrival Time: 7867  ASSESSMENT & PLAN:  1. Intractable migraine without aura and without status migrainosus    Meds ordered this encounter  Medications  . ketorolac (TORADOL) injection 60 mg  . dexamethasone (DECADRON) injection 10 mg   Normal neurological exam. Afebrile without nuchal rigidity. Current presentation and symptoms are consistent with prior migraines and are not consistent with SAH, ICH, meningitis, or temporal arteritis. Without fever, focal neuro logical deficits, nuchal rigidity, or change in vision. No indication for neurodiagnostic workup at this time. Discussed.  Follow-up Information    [facility].   Specialty:  Emergency Medicine Why:  If symptoms worsen in any way. Contact information: [address] [id number] [place] [phone]         Reviewed expectations re: course of current medical issues. Questions answered. Outlined signs and symptoms indicating need for more acute intervention. Patient verbalized understanding. After Visit Summary given.   SUBJECTIVE:  [name] is a 53 y.o. female who presents with complaint of a migraine headache. Reports gradual onset 1 day ago. Location: currently frontal and bilateral without radiation. History of headaches: yes with similar symptoms. Precipitating factors include: none which have been determined. Associated symptoms: Preceding aura: no. Nausea/vomiting: mild at times; none currently. Vision changes: no. Increased sensitivity to light and to noises: yes; mild. Fever: no. Sinus pressure/congestion: no. Extremity weakness: no. Home treatment has included OTC analgesics with little improvement. Current headache has not limited normal daily activities. Denies depression, dizziness, loss of balance, muscle weakness, numbness of extremities and speech difficulties. No head injury  reported. Ambulatory without difficulty.  ROS: As per HPI. All other systems negative.   OBJECTIVE:  Vitals:   08/24/18 1112  BP: (!) 136/101  Pulse: 91  Resp: 16  Temp: 98 F (36.7 C)  TempSrc: Oral  SpO2: 95%    General appearance: alert; no distress but appears fatigued Eyes: PERRLA; EOMI; conjunctiva normal HENT: normocephalic; atraumatic Neck: supple with FROM Lungs: clear to auscultation bilaterally Heart: regular rate and rhythm Extremities: no edema; symmetrical with no gross deformities Skin: warm and dry Neurologic: alert; clear speech; without facial droop; CN3-12 grossly intact; bilateral upper and lower extremity sensation grossly intact. 5/5 symmetric strength; normal gait  Psychological: alert and cooperative; normal mood and affect  Allergies  Allergen Reactions  . Gluten Meal Diarrhea and Nausea Only       . Ondansetron Other (See Comments)    Helps her nausea, but makes HA worse    Past Medical History:  Diagnosis Date  . Anemia   . Celiac disease   . Elevated blood pressure reading   . Family history of adverse reaction to anesthesia    MOM-HARD TIME WAKING UP  . GERD (gastroesophageal reflux disease)   . Migraine with visual aura    MIGRAINES  . UTI (lower urinary tract infection)    Social History   Socioeconomic History  . Marital status: Married    Spouse name: Jenny Reichmann  . Number of children: 3  . Years of education: 59  . Highest education level: Not on file  Occupational History  . Occupation: Personnel officer  Social Needs  . Financial resource strain: Not on file  . Food insecurity:    Worry: Not on file    Inability: Not on file  . Transportation needs:    Medical: Not on file    Non-medical: Not on file  Tobacco Use  . Smoking status: Never Smoker  . Smokeless tobacco: Never Used  Substance and Sexual Activity  . Alcohol use: No    Alcohol/week: 0.0 standard drinks  . Drug use: No  . Sexual activity: Yes     Partners: Female    Birth control/protection: Surgical    Comment: INTERCOURSE AGE 42, SEXUAL PARTNERS LEES THAN 5  Lifestyle  . Physical activity:    Days per week: Not on file    Minutes per session: Not on file  . Stress: Not on file  Relationships  . Social connections:    Talks on phone: Not on file    Gets together: Not on file    Attends religious service: Not on file    Active member of club or organization: Not on file    Attends meetings of clubs or organizations: Not on file    Relationship status: Not on file  . Intimate partner violence:    Fear of current or ex partner: Not on file    Emotionally abused: Not on file    Physically abused: Not on file    Forced sexual activity: Not on file  Other Topics Concern  . Not on file  Social History Narrative   Lives with her husband and their three children, and her daughter's boyfriend.  Her older son's daughter lives there part-time as well.   Family History  Problem Relation Age of Onset  . Diabetes Son 12       type 1   Past Surgical History:  Procedure Laterality Date  . BLADDER SUSPENSION    . EXPLORATORY LAPAROTOMY    . IUD REMOVAL  07/25/2017   Procedure: INTRAUTERINE DEVICE (IUD) REMOVAL;  Surgeon: Harlin Heys, MD;  Location: [facility] ORS;  Service: Gynecology;;  . LAPAROSCOPIC ASSISTED VAGINAL HYSTERECTOMY Bilateral 07/25/2017   Procedure: LAPAROSCOPIC ASSISTED VAGINAL HYSTERECTOMY WITH BILATERAL [place] OOPHERECTOMY;  Surgeon: Harlin Heys, MD;  Location: [facility] ORS;  Service: Gynecology;  Laterality: Bilateral;  . TUBAL LIGATION       Vanessa Kick, MD 08/24/18 1135

## 2018-08-25 ENCOUNTER — Telehealth: Payer: Self-pay

## 2018-08-25 NOTE — Telephone Encounter (Signed)
I have received paperwork from [employer] and form is requesting expected return to work date. I have placed the form in provider box to look over.

## 2018-08-25 NOTE — Telephone Encounter (Signed)
For placed in Fax box in provider lounge. Expected RTW date of 09/04/18 with next OV 09/01/18.

## 2018-08-28 ENCOUNTER — Telehealth: Payer: Self-pay | Admitting: Family Medicine

## 2018-08-28 NOTE — Telephone Encounter (Signed)
PT called in to verify the status of forms faxed in by [employer] in regards to her short term disability. She says her FMLA has been approved, but [employer] is still waiting on the other faxed forms to be sent back by Dr. Carlota Raspberry.

## 2018-08-31 NOTE — Telephone Encounter (Signed)
Noted. I did see them. Barnett Applebaum is working on it.

## 2018-08-31 NOTE — Telephone Encounter (Signed)
See other prior messages. Barnett Applebaum handled these most recently for me.  See if form needs to be resent.

## 2018-08-31 NOTE — Telephone Encounter (Signed)
Dr. Carlota Raspberry I have not received these forms. Do you still have them?

## 2018-09-01 ENCOUNTER — Telehealth (INDEPENDENT_AMBULATORY_CARE_PROVIDER_SITE_OTHER): Payer: BLUE CROSS/BLUE SHIELD | Admitting: Family Medicine

## 2018-09-01 ENCOUNTER — Other Ambulatory Visit: Payer: Self-pay

## 2018-09-01 DIAGNOSIS — G4700 Insomnia, unspecified: Secondary | ICD-10-CM

## 2018-09-01 DIAGNOSIS — G43909 Migraine, unspecified, not intractable, without status migrainosus: Secondary | ICD-10-CM

## 2018-09-01 DIAGNOSIS — F339 Major depressive disorder, recurrent, unspecified: Secondary | ICD-10-CM

## 2018-09-01 MED ORDER — HYDROXYZINE HCL 25 MG PO TABS: 12.5000 mg | ORAL_TABLET | Freq: Every evening | ORAL | 0 refills | Status: DC | PRN

## 2018-09-01 MED ORDER — BUPROPION HCL ER (XL) 300 MG PO TB24: 300.0000 mg | ORAL_TABLET | Freq: Every day | ORAL | 0 refills | Status: DC

## 2018-09-01 NOTE — Progress Notes (Signed)
Virtual Visit via Telephone Note  I connected with [name] on 09/01/18 at 8:48 AM by telephone and verified that I am speaking with the correct person using two identifiers.   I discussed the limitations, risks, security and privacy concerns of performing an evaluation and management service by telephone and the availability of in person appointments. I also discussed with the patient that there may be a patient responsible charge related to this service. The patient expressed understanding and agreed to proceed, consent obtained  Chief complaint:  Depression/insomnia follow-up  History of Present Illness: [name] is a 53 y.o. female  Depression: Follow-up.  See previous notes.  Had improved with increase Wellbutrin at 300 mg daily, hydroxyzine for sleep, and meeting with therapist.  Home stressors but primary stressor was work and difficulty with depression/anxiety to return to work.  Has been out of work due to illness during this time. Hydroxyzine 25 mg at night for sleep.  Still using hydroxyzine at night. Tried hydroxyzine once when had HA and tension.   Last met with therapist about 2 weeks ago - follow up as needed.  Had interview with other job yesterday.  Does not feel ready to return to other job. Worried it will put her back into her same state, increase headaches.  Depression improved, but feels like returning to job will worsen symptoms.  No suicidal ideation.  wellbutrin 357m per day seems to be helpful.  Plans to call employer - plans to resign.   [facility] 09/01/2018 08/18/2018 08/11/2018 08/02/2018 07/26/2018  Decreased Interest 2 0 _0 Down, Depressed, Hopeless 0 0 0 1 1  PHQ - 2 Score 2 0 _1 Altered sleeping 0 1 - 0 1  Tired, decreased energy 0 0 - 1 1  Change in appetite 0 0 - 0 1  Feeling bad or failure about yourself  2 0 - 0 3  Trouble concentrating 0 0 - 0 0  Moving slowly or fidgety/restless 0 0 - 0 2  Suicidal thoughts 0 0  - 0 0  PHQ-9 Score 4 1 - 3 10  Difficult doing work/chores Not difficult at all Not difficult at all - Not difficult at all -   Headaches:  See prior discussions.  Symptoms have been improving.  Was seen May 14 at urgent care for migraine headache again.  Treated with Toradol and Decadron.  Has been discussed in the past meeting with headache specialist but had deferred given improvement in symptoms.  Plans on calling Headache wellness center (prior referral), now that HAs have returned.     Patient Active Problem List   Diagnosis Date Noted  . Post-operative state 07/25/2017  . Blood loss anemia 03/05/2017  . BV (bacterial vaginosis) 11/27/2012  . UTI (lower urinary tract infection) 11/27/2012  . Migraine with visual aura 09/19/2011  . Depression 09/19/2011  . Constipation 06/25/2011   Past Medical History:  Diagnosis Date  . Anemia   . Celiac disease   . Elevated blood pressure reading   . Family history of adverse reaction to anesthesia    MOM-HARD TIME WAKING UP  . GERD (gastroesophageal reflux disease)   . Migraine with visual aura    MIGRAINES  . UTI (lower urinary tract infection)    Past Surgical History:  Procedure Laterality Date  . BLADDER SUSPENSION    . EXPLORATORY LAPAROTOMY    . IUD REMOVAL  07/25/2017   Procedure: INTRAUTERINE DEVICE (IUD) REMOVAL;  Surgeon: Harlin Heys, MD;  Location: [facility] ORS;  Service: Gynecology;;  . LAPAROSCOPIC ASSISTED VAGINAL HYSTERECTOMY Bilateral 07/25/2017   Procedure: LAPAROSCOPIC ASSISTED VAGINAL HYSTERECTOMY WITH BILATERAL [place] OOPHERECTOMY;  Surgeon: Harlin Heys, MD;  Location: [facility] ORS;  Service: Gynecology;  Laterality: Bilateral;  . TUBAL LIGATION     Allergies  Allergen Reactions  . Gluten Meal Diarrhea and Nausea Only       . Ondansetron Other (See Comments)    Helps her nausea, but makes HA worse   Prior to Admission medications   Medication Sig Start Date End Date Taking? Authorizing Provider   buPROPion (WELLBUTRIN XL) 300 MG 24 hr tablet TAKE 1 TABLET BY MOUTH EVERY DAY 08/03/18  Yes Wendie Agreste, MD  hydrOXYzine (ATARAX/VISTARIL) 25 MG tablet Take 0.5-1 tablets (12.5-25 mg total) by mouth at bedtime as needed (for sleep.). 08/11/18  Yes Wendie Agreste, MD  omeprazole (PRILOSEC) 20 MG capsule TAKE 1 CAPSULE BY MOUTH EVERY DAY 06/02/18  Yes Delia Chimes A, MD  phentermine 15 MG capsule Take 15 mg by mouth every morning.   Yes [provider]  albuterol (PROVENTIL HFA;VENTOLIN HFA) 108 (90 Base) MCG/ACT inhaler Inhale 2 puffs into the lungs every 4 (four) hours as needed for wheezing or shortness of breath (cough, shortness of breath or wheezing.). Patient not taking: Reported on 09/01/2018 07/04/18   Robyn Haber, MD   Social History   Socioeconomic History  . Marital status: Married    Spouse name: Jenny Reichmann  . Number of children: 3  . Years of education: 49  . Highest education level: Not on file  Occupational History  . Occupation: Personnel officer  Social Needs  . Financial resource strain: Not on file  . Food insecurity:    Worry: Not on file    Inability: Not on file  . Transportation needs:    Medical: Not on file    Non-medical: Not on file  Tobacco Use  . Smoking status: Never Smoker  . Smokeless tobacco: Never Used  Substance and Sexual Activity  . Alcohol use: No    Alcohol/week: 0.0 standard drinks  . Drug use: No  . Sexual activity: Yes    Partners: Female    Birth control/protection: Surgical    Comment: INTERCOURSE AGE 69, SEXUAL PARTNERS LEES THAN 5  Lifestyle  . Physical activity:    Days per week: Not on file    Minutes per session: Not on file  . Stress: Not on file  Relationships  . Social connections:    Talks on phone: Not on file    Gets together: Not on file    Attends religious service: Not on file    Active member of club or organization: Not on file    Attends meetings of clubs or organizations: Not on file     Relationship status: Not on file  . Intimate partner violence:    Fear of current or ex partner: Not on file    Emotionally abused: Not on file    Physically abused: Not on file    Forced sexual activity: Not on file  Other Topics Concern  . Not on file  Social History Narrative   Lives with her husband and their three children, and her daughter's boyfriend.  Her older son's daughter lives there part-time as well.    Observations/Objective: No distress on phone, no SI.  Normal speech, no distress.   Assessment and Plan: Migraine without status migrainosus, not  intractable, unspecified migraine type  -Plan is to call headache wellness center today to schedule appointment now the headaches have returned.  ER/RTC precautions if acute worsening.  Episode of recurrent major depressive disorder, unspecified depression episode severity ([place]) - Plan: buPROPion (WELLBUTRIN XL) 300 MG 24 hr tablet Insomnia, unspecified type - Plan: hydrOXYzine (ATARAX/VISTARIL) 25 MG tablet  -Still some persistent anxiety and depressed symptoms regarding work.  Overall depression has improved sleep is improved with Wellbutrin and hydroxyzine.  Continue same dose of medications.  Has option of therapist if needed but apparently that is as needed follow-up at this time.   -Extend out of work for 1 week for her to determine options whether to resign her current position or to discuss with her employer's Scientist, research (medical) department if concerns with workplace. \   Follow Up Instructions:  1 week.   I discussed the assessment and treatment plan with the patient. The patient was provided an opportunity to ask questions and all were answered. The patient agreed with the plan and demonstrated an understanding of the instructions.   The patient was advised to call back or seek an in-person evaluation if the symptoms worsen or if the condition fails to improve as anticipated.  I provided 13 minutes of non-face-to-face  time during this encounter.  Signed,   Merri Ray, MD [facility].  09/01/18

## 2018-09-01 NOTE — Patient Instructions (Signed)
Given continued symptoms revolving around work, I think it is reasonable to be out of work 1 additional week to determine plan.  I will have our staff check into your paperwork and then that as needed.  Since headaches have now returned, I do recommend following up with headache wellness center.  Let me know if they need a new referral.  No change in Wellbutrin or hydroxyzine doses for now.  Please let me know if there are questions

## 2018-09-01 NOTE — Progress Notes (Signed)
Chief Complaint: FMLA paper work, med refill ( pending)  and depression  PHQ-9 score = 4  GAD 7 Questionnaire Over the last 2 weeks, how often have you been bothered by the following problems?   Not at all sure = 0   Several days = 1   Over half the days =2   Nearly every day =3  1.  Feeling nervous, anxious, or on edge - 0 2.  Not being able to stop or control worrying - 0 3.  Worrying too much about different things - 0 4.  Trouble relaxing - 0 5.  Being so restless that it's hard to sit still - 0 6.  Becoming easily annoyed or irritable - 0 7.  Feeling afraid as if something awful might happen - 0 Total Score (add your column scores) =   0        If you checked off any problems, how difficult have these made it for you to do your work, take care of things at home, or get along with other people?   Not difficult at all

## 2018-09-19 ENCOUNTER — Ambulatory Visit (HOSPITAL_COMMUNITY)
Admission: EM | Admit: 2018-09-19 | Discharge: 2018-09-19 | Disposition: A | Discharge status: Home / Self Care | Payer: PRIVATE HEALTH INSURANCE | Attending: Family Medicine | Admitting: Family Medicine

## 2018-09-19 ENCOUNTER — Encounter (HOSPITAL_COMMUNITY): Payer: Self-pay

## 2018-09-19 ENCOUNTER — Other Ambulatory Visit: Payer: Self-pay

## 2018-09-19 DIAGNOSIS — N390 Urinary tract infection, site not specified: Secondary | ICD-10-CM | POA: Diagnosis present

## 2018-09-19 LAB — POCT URINALYSIS DIP (DEVICE)
Bilirubin Urine: NEGATIVE
Glucose, UA: NEGATIVE mg/dL
Ketones, ur: NEGATIVE mg/dL
Nitrite: POSITIVE — AB
Protein, ur: NEGATIVE mg/dL
Specific Gravity, Urine: >=1.03 (ref 1.005–1.030)
Urobilinogen, UA: 0.2 mg/dL (ref 0.0–1.0)
pH: 6 (ref 5.0–8.0)

## 2018-09-19 MED ORDER — NITROFURANTOIN MONOHYD MACRO 100 MG PO CAPS: 100.0000 mg | ORAL_CAPSULE | Freq: Two times a day (BID) | ORAL | 0 refills | Status: DC

## 2018-09-19 NOTE — ED Triage Notes (Signed)
Pt presents with urinary tract symptoms for over a week; pt states she has urinary urgency and discomfort.

## 2018-09-19 NOTE — ED Provider Notes (Signed)
[facility]    CSN: [id number] Arrival date & time: 09/19/18  0803     History   Chief Complaint Chief Complaint  Patient presents with  . Urinary Tract Infection    HPI [name] is a 53 y.o. female.   Patient is a 53 year old female with past medical history of anemia, celiac disease, GERD, migraines, UTI.  She presents today with approximately 1 month of urinary tract symptoms.  She has had some dysuria and urinary frequency.  Symptoms have been waxing and waning.  She has not taken any thing for her symptoms.  She denies any associated abdominal pain, back pain, flank pain, fevers, chills, nausea or vomiting.  No vaginal symptoms.  ROS per HPI      Past Medical History:  Diagnosis Date  . Anemia   . Celiac disease   . Elevated blood pressure reading   . Family history of adverse reaction to anesthesia    MOM-HARD TIME WAKING UP  . GERD (gastroesophageal reflux disease)   . Migraine with visual aura    MIGRAINES  . UTI (lower urinary tract infection)     Patient Active Problem List   Diagnosis Date Noted  . Post-operative state 07/25/2017  . Blood loss anemia 03/05/2017  . BV (bacterial vaginosis) 11/27/2012  . UTI (lower urinary tract infection) 11/27/2012  . Migraine with visual aura 09/19/2011  . Depression 09/19/2011  . Constipation 06/25/2011    Past Surgical History:  Procedure Laterality Date  . BLADDER SUSPENSION    . EXPLORATORY LAPAROTOMY    . IUD REMOVAL  07/25/2017   Procedure: INTRAUTERINE DEVICE (IUD) REMOVAL;  Surgeon: Harlin Heys, MD;  Location: [facility] ORS;  Service: Gynecology;;  . LAPAROSCOPIC ASSISTED VAGINAL HYSTERECTOMY Bilateral 07/25/2017   Procedure: LAPAROSCOPIC ASSISTED VAGINAL HYSTERECTOMY WITH BILATERAL [place] OOPHERECTOMY;  Surgeon: Harlin Heys, MD;  Location: [facility] ORS;  Service: Gynecology;  Laterality: Bilateral;  . TUBAL LIGATION      OB History    Gravida  3   Para  3   Term  3   Preterm       AB      Living  3     SAB      TAB      Ectopic      Multiple      Live Births  3            Home Medications    Prior to Admission medications   Medication Sig Start Date End Date Taking? Authorizing Provider  albuterol (PROVENTIL HFA;VENTOLIN HFA) 108 (90 Base) MCG/ACT inhaler Inhale 2 puffs into the lungs every 4 (four) hours as needed for wheezing or shortness of breath (cough, shortness of breath or wheezing.). Patient not taking: Reported on 09/01/2018 07/04/18   Robyn Haber, MD  buPROPion (WELLBUTRIN XL) 300 MG 24 hr tablet Take 1 tablet (300 mg total) by mouth daily. 09/01/18   Wendie Agreste, MD  hydrOXYzine (ATARAX/VISTARIL) 25 MG tablet Take 0.5-1 tablets (12.5-25 mg total) by mouth at bedtime as needed (for sleep.). 09/01/18   Wendie Agreste, MD  nitrofurantoin, macrocrystal-monohydrate, (MACROBID) 100 MG capsule Take 1 capsule (100 mg total) by mouth 2 (two) times daily. 09/19/18   Loura Halt A, NP  omeprazole (PRILOSEC) 20 MG capsule TAKE 1 CAPSULE BY MOUTH EVERY DAY 06/02/18   Delia Chimes A, MD  phentermine 15 MG capsule Take 15 mg by mouth every morning.    [provider]  Family History Family History  Problem Relation Age of Onset  . Diabetes Son 80       type 1    Social History Social History   Tobacco Use  . Smoking status: Never Smoker  . Smokeless tobacco: Never Used  Substance Use Topics  . Alcohol use: No    Alcohol/week: 0.0 standard drinks  . Drug use: No     Allergies   Gluten meal and Ondansetron   Review of Systems Review of Systems   Physical Exam Triage Vital Signs ED Triage Vitals  Enc Vitals Group     BP 09/19/18 0815 (!) 141/104     Pulse Rate 09/19/18 0815 92     Resp 09/19/18 0815 18     Temp 09/19/18 0815 98.2 F (36.8 C)     Temp Source 09/19/18 0815 Oral     SpO2 09/19/18 0815 96 %     Weight --      Height --      Head Circumference --      Peak Flow --      Pain Score  09/19/18 0817 5     Pain Loc --      Pain Edu? --      Excl. in [place]? --    No data found.  Updated Vital Signs BP (!) 141/104 (BP Location: Right Arm)   Pulse 92   Temp 98.2 F (36.8 C) (Oral)   Resp 18   LMP  (LMP Unknown)   SpO2 96%   Visual Acuity Right Eye Distance:   Left Eye Distance:   Bilateral Distance:    Right Eye Near:   Left Eye Near:    Bilateral Near:     Physical Exam Vitals signs and nursing note reviewed.  Constitutional:      General: She is not in acute distress.    Appearance: Normal appearance. She is not ill-appearing, toxic-appearing or diaphoretic.  HENT:     Head: Normocephalic.     Nose: Nose normal.     Mouth/Throat:     Pharynx: Oropharynx is clear.  Eyes:     Conjunctiva/sclera: Conjunctivae normal.  Neck:     Musculoskeletal: Normal range of motion.  Pulmonary:     Effort: Pulmonary effort is normal.  Abdominal:     Palpations: Abdomen is soft.     Tenderness: There is no abdominal tenderness.  Musculoskeletal: Normal range of motion.  Skin:    General: Skin is warm and dry.     Findings: No rash.  Neurological:     Mental Status: She is alert.  Psychiatric:        Mood and Affect: Mood normal.      UC Treatments / Results  Labs (all labs ordered are listed, but only abnormal results are displayed) Labs Reviewed  POCT URINALYSIS DIP (DEVICE) - Abnormal; Notable for the following components:      Result Value   Hgb urine dipstick SMALL (*)    Nitrite POSITIVE (*)    Leukocytes,Ua SMALL (*)    All other components within normal limits  URINE CULTURE    EKG None  Radiology No results found.  Procedures Procedures (including critical care time)  Medications Ordered in UC Medications - No data to display  Initial Impression / Assessment and Plan / UC Course  I have reviewed the triage vital signs and the nursing notes.  Pertinent labs & imaging results that were available during my care of the patient  were  reviewed by me and considered in my medical decision making (see chart for details).     Urine positive for urinary tract infection Will send for culture Treating with Macrobid Follow up as needed for continued or worsening symptoms  Final Clinical Impressions(s) / UC Diagnoses   Final diagnoses:  Lower urinary tract infectious disease     Discharge Instructions     Your urine was positive for a urinary tract infection Take the medication as prescribed Make sure you are pushing fluids and staying hydrated Follow up as needed for continued or worsening symptoms     ED Prescriptions    Medication Sig Dispense Auth. Provider   nitrofurantoin, macrocrystal-monohydrate, (MACROBID) 100 MG capsule Take 1 capsule (100 mg total) by mouth 2 (two) times daily. 10 capsule Loura Halt A, NP     Controlled Substance Prescriptions [place] Controlled Substance Registry consulted? Not Applicable   Orvan July, NP 09/19/18 [phone]

## 2018-09-19 NOTE — Discharge Instructions (Addendum)
Your urine was positive for a urinary tract infection Take the medication as prescribed Make sure you are pushing fluids and staying hydrated Follow up as needed for continued or worsening symptoms

## 2018-09-20 ENCOUNTER — Ambulatory Visit: Payer: BLUE CROSS/BLUE SHIELD | Admitting: Family Medicine

## 2018-09-21 ENCOUNTER — Telehealth (HOSPITAL_COMMUNITY): Payer: Self-pay | Admitting: Emergency Medicine

## 2018-09-21 LAB — URINE CULTURE: Culture: >=100000 — AB

## 2018-09-21 NOTE — Telephone Encounter (Signed)
Urine culture was positive for  and was given   at urgent care visit. Attempted to reach patient. No answer at this time.

## 2018-10-05 ENCOUNTER — Other Ambulatory Visit: Payer: Self-pay | Admitting: Family Medicine

## 2018-10-05 DIAGNOSIS — G4700 Insomnia, unspecified: Secondary | ICD-10-CM

## 2018-10-05 NOTE — Telephone Encounter (Signed)
Dr. Carlota Raspberry ok to increase qty to #90 instead of #30

## 2018-10-10 NOTE — Telephone Encounter (Signed)
Patient is requesting a refill of the following medications: Requested Prescriptions   Pending Prescriptions Disp Refills  . hydrOXYzine (ATARAX/VISTARIL) 25 MG tablet [Pharmacy Med Name: HYDROXYZINE HCL 25 MG TABLET] 90 tablet 1    Sig: Take 0.5-1 tablets (12.5-25 mg total) by mouth at bedtime as needed (for sleep.).    Date of patient request: 10/05/2018 Last office visit: 09/01/2018 Date of last refill: 09/01/2018 Last refill amount: 30 tab Follow up time period per chart: 1 week. from [facility] of (09/01/2018) no appt on scheduled

## 2018-11-10 ENCOUNTER — Other Ambulatory Visit: Payer: Self-pay | Admitting: Family Medicine

## 2018-11-23 ENCOUNTER — Other Ambulatory Visit: Payer: Self-pay | Admitting: Family Medicine

## 2018-11-23 DIAGNOSIS — F339 Major depressive disorder, recurrent, unspecified: Secondary | ICD-10-CM

## 2018-11-23 NOTE — Telephone Encounter (Signed)
Forwarding medication refill request to the clinical pool for review. 

## 2018-11-24 NOTE — Telephone Encounter (Signed)
Patient is requesting a refill of the following medications: Requested Prescriptions   Pending Prescriptions Disp Refills  . buPROPion (WELLBUTRIN XL) 300 MG 24 hr tablet [Pharmacy Med Name: BUPROPION HCL XL 300 MG TABLET] 90 tablet 0    Sig: TAKE 1 TABLET BY MOUTH EVERY DAY    Date of patient request: 11/23/18 Last office visit: 09/01/18 Date of last refill: 09/01/18 Last refill amount: 90 0rf Follow up time period per chart: none scheduled

## 2018-12-07 ENCOUNTER — Telehealth: Payer: Self-pay | Admitting: Family Medicine

## 2018-12-07 NOTE — Telephone Encounter (Signed)
Pt called stating she is on Bupropion and that she has been advised to start taking two tablets a day. Pt states she believes this is too much for her and is requesting to go back to one tablet a day. Please advise.

## 2018-12-08 ENCOUNTER — Telehealth: Payer: Self-pay | Admitting: Family Medicine

## 2018-12-08 NOTE — Telephone Encounter (Signed)
Called patient.   Feels like doing better with anxiety symptoms, and would like to decrease from 2 pills per day (1 in am and 1 at night). Has been Verified rx: wellbutrin 300mg  xl.  Has been taking 1 in am and 1 at night. Should be on QD dosing only - she will decrease to just 1 per day as on Rx. (total 300mg  QD).

## 2018-12-08 NOTE — Telephone Encounter (Signed)
Pt called and left message to return call regarding Wellbutrin dose. See previous note from Dr. Carlota Raspberry.

## 2018-12-08 NOTE — Telephone Encounter (Signed)
Spoke to pt and she would like to know if she can take one tablet instead of 2? Please advise

## 2018-12-08 NOTE — Telephone Encounter (Signed)
Wellbutrin was increased from 150 mg to 300 mg when I evaluated her in April.  I do not see a recent change in medications.  Is she asking to go back to the 150 mg dose or to remain on 300 mg dose?

## 2018-12-14 ENCOUNTER — Encounter (HOSPITAL_COMMUNITY): Payer: Self-pay

## 2018-12-14 ENCOUNTER — Other Ambulatory Visit: Payer: Self-pay

## 2018-12-14 ENCOUNTER — Ambulatory Visit (HOSPITAL_COMMUNITY)
Admission: EM | Admit: 2018-12-14 | Discharge: 2018-12-14 | Disposition: A | Discharge status: Home / Self Care | Payer: PRIVATE HEALTH INSURANCE | Attending: Family Medicine | Admitting: Family Medicine

## 2018-12-14 DIAGNOSIS — N3090 Cystitis, unspecified without hematuria: Secondary | ICD-10-CM | POA: Insufficient documentation

## 2018-12-14 LAB — POCT URINALYSIS DIP (DEVICE)
Bilirubin Urine: NEGATIVE
Glucose, UA: NEGATIVE mg/dL
Nitrite: NEGATIVE
Protein, ur: 30 mg/dL — AB
Specific Gravity, Urine: 1.025 (ref 1.005–1.030)
Urobilinogen, UA: 0.2 mg/dL (ref 0.0–1.0)
pH: 6.5 (ref 5.0–8.0)

## 2018-12-14 MED ORDER — CEPHALEXIN 500 MG PO CAPS: 500.0000 mg | ORAL_CAPSULE | Freq: Two times a day (BID) | ORAL | 0 refills | Status: DC

## 2018-12-14 NOTE — ED Provider Notes (Signed)
[facility]    ASSESSMENT & PLAN:  1. Cystitis     To begin: Meds ordered this encounter  Medications  . cephALEXin (KEFLEX) 500 MG capsule    Sig: Take 1 capsule (500 mg total) by mouth 2 (two) times daily.    Dispense:  10 capsule    Refill:  0   No signs of pyelonephritis. Discussed. Urine culture sent. Will notify patient when results available. Will follow up with her PCP or here if not showing improvement over the next 48 hours, sooner if needed. Work note provided.  Outlined signs and symptoms indicating need for more acute intervention. Patient verbalized understanding. After Visit Summary given.  SUBJECTIVE:  [name] is a 53 y.o. female who complains of urinary frequency, urgency and dysuria for the past several days; reports similar symptoms off and on over the past three weeks. "Feels like a bladder infection." H/O UTI. Without associated fever, chills, vaginal discharge or bleeding. Occasional and mild bilateral lower back discomfort. Gross hematuria: not present. No specific aggravating or alleviating factors reported. Without vaginal discharge. No LE edema. Normal PO intake. Without specific abdominal pain. Ambulatory without difficulty. Needed to leave work early today secondary to symptoms. OTC treatment: none.  LMP: No LMP recorded (lmp unknown). Patient has had a hysterectomy.  ROS: As in HPI. All other systems negative.   OBJECTIVE:  Vitals:   12/14/18 1626 12/14/18 1627  BP:  126/89  Pulse:  95  Resp:  16  Temp:  98 F (36.7 C)  TempSrc:  Oral  SpO2:  100%  Weight: 64.9 kg    General appearance: alert; no distress HENT: oropharynx: moist CV: RRR Lungs: unlabored respirations Abdomen: soft, non-tender; bowel sounds normal; no masses or organomegaly; no guarding or rebound tenderness Back: no CVA tenderness Extremities: no edema; symmetrical with no gross deformities Skin: warm and dry Neurologic: normal gait Psychological:  alert and cooperative; normal mood and affect  Labs Reviewed  POCT URINALYSIS DIP (DEVICE) - Abnormal; Notable for the following components:      Result Value   Ketones, ur TRACE (*)    Hgb urine dipstick TRACE (*)    Protein, ur 30 (*)    Leukocytes,Ua MODERATE (*)    All other components within normal limits  URINE CULTURE    Allergies  Allergen Reactions  . Gluten Meal Diarrhea and Nausea Only       . Ondansetron Other (See Comments)    Helps her nausea, but makes HA worse    Past Medical History:  Diagnosis Date  . Anemia   . Celiac disease   . Elevated blood pressure reading   . Family history of adverse reaction to anesthesia    MOM-HARD TIME WAKING UP  . GERD (gastroesophageal reflux disease)   . Migraine with visual aura    MIGRAINES  . UTI (lower urinary tract infection)    Social History   Socioeconomic History  . Marital status: Married    Spouse name: Jenny Reichmann  . Number of children: 3  . Years of education: 12  . Highest education level: Not on file  Occupational History  . Occupation: Personnel officer  Social Needs  . Financial resource strain: Not on file  . Food insecurity    Worry: Not on file    Inability: Not on file  . Transportation needs    Medical: Not on file    Non-medical: Not on file  Tobacco Use  . Smoking status:  Never Smoker  . Smokeless tobacco: Never Used  Substance and Sexual Activity  . Alcohol use: No    Alcohol/week: 0.0 standard drinks  . Drug use: No  . Sexual activity: Yes    Partners: Female    Birth control/protection: Surgical    Comment: INTERCOURSE AGE 66, SEXUAL PARTNERS LEES THAN 5  Lifestyle  . Physical activity    Days per week: Not on file    Minutes per session: Not on file  . Stress: Not on file  Relationships  . Social Herbalist on phone: Not on file    Gets together: Not on file    Attends religious service: Not on file    Active member of club or organization: Not on file     Attends meetings of clubs or organizations: Not on file    Relationship status: Not on file  . Intimate partner violence    Fear of current or ex partner: Not on file    Emotionally abused: Not on file    Physically abused: Not on file    Forced sexual activity: Not on file  Other Topics Concern  . Not on file  Social History Narrative   Lives with her husband and their three children, and her daughter's boyfriend.  Her older son's daughter lives there part-time as well.   Family History  Problem Relation Age of Onset  . Diabetes Son 104       type 1       Vanessa Kick, MD 12/14/18 [phone]

## 2018-12-14 NOTE — ED Triage Notes (Signed)
Pt states she thinks she has a UTI x 5 days.

## 2018-12-16 LAB — URINE CULTURE: Culture: >=100000 — AB

## 2019-01-23 ENCOUNTER — Encounter (HOSPITAL_COMMUNITY): Payer: Self-pay

## 2019-01-23 ENCOUNTER — Ambulatory Visit (HOSPITAL_COMMUNITY)
Admission: EM | Admit: 2019-01-23 | Discharge: 2019-01-23 | Disposition: A | Discharge status: Home / Self Care | Payer: PRIVATE HEALTH INSURANCE | Attending: Family Medicine | Admitting: Family Medicine

## 2019-01-23 ENCOUNTER — Other Ambulatory Visit: Payer: Self-pay

## 2019-01-23 DIAGNOSIS — F419 Anxiety disorder, unspecified: Secondary | ICD-10-CM | POA: Diagnosis not present

## 2019-01-23 MED ORDER — HYDROXYZINE HCL 25 MG PO TABS: 25.0000 mg | ORAL_TABLET | Freq: Four times a day (QID) | ORAL | 0 refills | Status: DC

## 2019-01-23 NOTE — Discharge Instructions (Addendum)
I sent some more hydroxyzine to your pharmacy.  You can take this every 6 hours as needed.  I recommended taking it as soon as you get home. Make sure you see your doctor tomorrow for follow-up If you start having any suicidal or homicidal thoughts you need to go straight to the ER. Follow up as needed for continued or worsening symptoms

## 2019-01-23 NOTE — ED Provider Notes (Signed)
[place]    CSN: [id number] Arrival date & time: 01/23/19  [id number]      History   Chief Complaint Chief Complaint  Patient presents with  . Anxiety    HPI [name] is a 53 y.o. female.   Patient is a 53 year old female with past medical history of anemia, celiac, anxiety, migraine, GERD, depression.  She presents today with complaints of anxiety.  She has been taking Wellbutrin and using hydroxyzine as needed for the past 9 months.  She was previously well controlled with the Wellbutrin but feels like this is not working for her anymore.  She takes hydroxyzine at bedtime.  She has worries and thoughts constantly that her husband is cheating on her.  She is unable to get any sleep.  She has an appointment to see her primary care doctor tomorrow evening.  She denies any suicidal or homicidal ideations.   ROS per HPI    Anxiety    Past Medical History:  Diagnosis Date  . Anemia   . Celiac disease   . Elevated blood pressure reading   . Family history of adverse reaction to anesthesia    MOM-HARD TIME WAKING UP  . GERD (gastroesophageal reflux disease)   . Migraine with visual aura    MIGRAINES  . UTI (lower urinary tract infection)     Patient Active Problem List   Diagnosis Date Noted  . Post-operative state 07/25/2017  . Blood loss anemia 03/05/2017  . BV (bacterial vaginosis) 11/27/2012  . UTI (lower urinary tract infection) 11/27/2012  . Migraine with visual aura 09/19/2011  . Depression 09/19/2011  . Constipation 06/25/2011    Past Surgical History:  Procedure Laterality Date  . BLADDER SUSPENSION    . EXPLORATORY LAPAROTOMY    . IUD REMOVAL  07/25/2017   Procedure: INTRAUTERINE DEVICE (IUD) REMOVAL;  Surgeon: Harlin Heys, MD;  Location: [facility] ORS;  Service: Gynecology;;  . LAPAROSCOPIC ASSISTED VAGINAL HYSTERECTOMY Bilateral 07/25/2017   Procedure: LAPAROSCOPIC ASSISTED VAGINAL HYSTERECTOMY WITH BILATERAL [place] OOPHERECTOMY;   Surgeon: Harlin Heys, MD;  Location: [facility] ORS;  Service: Gynecology;  Laterality: Bilateral;  . TUBAL LIGATION      OB History    Gravida  3   Para  3   Term  3   Preterm      AB      Living  3     SAB      TAB      Ectopic      Multiple      Live Births  3            Home Medications    Prior to Admission medications   Medication Sig Start Date End Date Taking? Authorizing Provider  buPROPion (WELLBUTRIN XL) 300 MG 24 hr tablet TAKE 1 TABLET BY MOUTH EVERY DAY 11/24/18   Wendie Agreste, MD  cephALEXin (KEFLEX) 500 MG capsule Take 1 capsule (500 mg total) by mouth 2 (two) times daily. 12/14/18   Vanessa Kick, MD  hydrOXYzine (ATARAX/VISTARIL) 25 MG tablet Take 1 tablet (25 mg total) by mouth every 6 (six) hours. 01/23/19   Carlisha Wisler, Tressia Miners A, NP  omeprazole (PRILOSEC) 20 MG capsule TAKE 1 CAPSULE BY MOUTH EVERY DAY 11/10/18   Delia Chimes A, MD  phentermine 15 MG capsule Take 15 mg by mouth every morning.    [provider]  albuterol (PROVENTIL HFA;VENTOLIN HFA) 108 (90 Base) MCG/ACT inhaler Inhale 2 puffs into the lungs every  4 (four) hours as needed for wheezing or shortness of breath (cough, shortness of breath or wheezing.). Patient not taking: Reported on 09/01/2018 07/04/18 12/14/18  Robyn Haber, MD    Family History Family History  Problem Relation Age of Onset  . Diabetes Son 60       type 1    Social History Social History   Tobacco Use  . Smoking status: Never Smoker  . Smokeless tobacco: Never Used  Substance Use Topics  . Alcohol use: No    Alcohol/week: 0.0 standard drinks  . Drug use: No     Allergies   Gluten meal and Ondansetron   Review of Systems Review of Systems   Physical Exam Triage Vital Signs ED Triage Vitals  Enc Vitals Group     BP 01/23/19 0929 (!) 149/105     Pulse Rate 01/23/19 0929 96     Resp 01/23/19 0929 17     Temp 01/23/19 0929 98.3 F (36.8 C)     Temp Source 01/23/19 0929 Oral      SpO2 01/23/19 0929 97 %     Weight --      Height --      Head Circumference --      Peak Flow --      Pain Score 01/23/19 0926 0     Pain Loc --      Pain Edu? --      Excl. in [place]? --    No data found.  Updated Vital Signs BP (!) 149/105 (BP Location: Right Arm)   Pulse 96   Temp 98.3 F (36.8 C) (Oral)   Resp 17   LMP  (LMP Unknown)   SpO2 97%   Visual Acuity Right Eye Distance:   Left Eye Distance:   Bilateral Distance:    Right Eye Near:   Left Eye Near:    Bilateral Near:     Physical Exam Vitals signs and nursing note reviewed.  Constitutional:      Comments: Very tearful   HENT:     Head: Normocephalic and atraumatic.     Nose: Nose normal.  Neck:     Musculoskeletal: Normal range of motion.  Pulmonary:     Effort: Pulmonary effort is normal.  Musculoskeletal: Normal range of motion.  Skin:    General: Skin is warm and dry.  Neurological:     Mental Status: She is alert.  Psychiatric:     Comments: Anxious       UC Treatments / Results  Labs (all labs ordered are listed, but only abnormal results are displayed) Labs Reviewed - No data to display  EKG   Radiology No results found.  Procedures Procedures (including critical care time)  Medications Ordered in UC Medications - No data to display  Initial Impression / Assessment and Plan / UC Course  I have reviewed the triage vital signs and the nursing notes.  Pertinent labs & imaging results that were available during my care of the patient were reviewed by me and considered in my medical decision making (see chart for details).     Anxiety-refilled patient's hydroxyzine and recommended taking every 6 hours as needed.  She denies any suicidal or homicidal thoughts.  If she starts feeling any of this she will need to go straight to the ER. Otherwise follow-up with her doctor tomorrow for possible change in long-term anxiety and depression medication. Final Clinical Impressions(s) /  UC Diagnoses   Final diagnoses:  Anxiety  Discharge Instructions     I sent some more hydroxyzine to your pharmacy.  You can take this every 6 hours as needed.  I recommended taking it as soon as you get home. Make sure you see your doctor tomorrow for follow-up If you start having any suicidal or homicidal thoughts you need to go straight to the ER. Follow up as needed for continued or worsening symptoms     ED Prescriptions    Medication Sig Dispense Auth. Provider   hydrOXYzine (ATARAX/VISTARIL) 25 MG tablet Take 1 tablet (25 mg total) by mouth every 6 (six) hours. 12 tablet Collen Hostler A, NP     I have reviewed the PDMP during this encounter.   Loura Halt A, NP 01/23/19 1017

## 2019-01-23 NOTE — ED Triage Notes (Signed)
Pt reports she started having anxiety episodes x 9 months proximately.  Pt states she snaps and start accusing her husband about cheating, Pt   is having trouble with sleep, pt gets angry, decreased her hygiene, she is isolating from her family. Pt is taking is taking Wellbutrin, she states is not helping her now.

## 2019-01-24 ENCOUNTER — Telehealth (INDEPENDENT_AMBULATORY_CARE_PROVIDER_SITE_OTHER): Payer: PRIVATE HEALTH INSURANCE | Admitting: Family Medicine

## 2019-01-24 DIAGNOSIS — F22 Delusional disorders: Secondary | ICD-10-CM | POA: Diagnosis not present

## 2019-01-24 DIAGNOSIS — F333 Major depressive disorder, recurrent, severe with psychotic symptoms: Secondary | ICD-10-CM

## 2019-01-24 DIAGNOSIS — F5104 Psychophysiologic insomnia: Secondary | ICD-10-CM

## 2019-01-24 NOTE — Progress Notes (Signed)
[id number]- Depression/Anxiety- patient PHQ9=20 And her GAD7=20 Patient states she do not think the medication she is on for depression is helping would like to try something else if possible.

## 2019-01-24 NOTE — Patient Instructions (Signed)
With your current symptoms I do recommend evaluation with mental health provider tonight or tomorrow morning. Continue hydroxyzine for now, and same dose of Welbutrin until evaluated by mental health professional as they may decide to make medication adjustments.  Call 911 or go to the nearest emergency room if any of your symptoms worsen or new symptoms occur.

## 2019-01-24 NOTE — Progress Notes (Signed)
Virtual Visit via Telephone Note  I connected with [name] on 01/24/19 at 6:16 PM by telephone and verified that I am speaking with the correct person using two identifiers.   I discussed the limitations, risks, security and privacy concerns of performing an evaluation and management service by telephone and the availability of in person appointments. I also discussed with the patient that there may be a patient responsible charge related to this service. The patient expressed understanding and agreed to proceed, consent obtained  Chief complaint: Anxiety  History of Present Illness: [name] is a 53 y.o. female  Depression:  Depression screen [facility] 2/9 01/24/2019 09/01/2018 08/18/2018 08/11/2018 08/02/2018  Decreased Interest 3 2 0 1 1  Down, Depressed, Hopeless 3 0 0 0 1  PHQ - 2 Score 6 2 0 1 2  Altered sleeping 3 0 1 - 0  Tired, decreased energy 1 0 0 - 1  Change in appetite 1 0 0 - 0  Feeling bad or failure about yourself  3 2 0 - 0  Trouble concentrating 1 0 0 - 0  Moving slowly or fidgety/restless 2 0 0 - 0  Suicidal thoughts 3 0 0 - 0  PHQ-9 Score 20 4 1  - 3  Difficult doing work/chores - Not difficult at all Not difficult at all - Not difficult at all   See prior notes, depression improved prior with increased wellbutrin at 364m Qd, hydroxyzine if needed, and ongoing treatment with therapist. Suspected workplace anxiety as well. Last visit May 22, planned 1 week follow-up. Had not spoken the patient until August when we clarified her medication, she had been taking Wellbutrin 600 mg total per day, clarified should be on just 300 mg. Felt like her anxiety symptoms were improved at that time.  Urgent care visit yesterday noted for anxiety, prescribed hydroxyzine, and dosing every 6 hrs discussed. No homicidal or suicidal ideation at that time, difficulty with sleep.   More snappy with people recently, past few months. More irritable.  Accusing husband of cheating on  her, but no specific reasons to think this. Frequent arguments with husband. No known inciting event or cause of her worsening symptoms.   Has not talked to therapist recently - last met with therapist "few months". Called therapist few weeks ago, missed call.   Referred to psychiatry last year, but has not met with psychiatry.   Taking wellbutrin [id number].  Taking hydroxyzine every 8 hours - feels about the same. Was able to sleep last night - took one of husbands xanax.  Has been up every night searching his phone. Not sleeping. Searching her phone Does not hear a voice, but some internal feeling that she can not get rid of.  Denies tactile hallucinations.  Has had some visual hallucinations - some things on wall like hole or object on floor will start moving. Noticed symptoms since January.  Suicidal thought - 3-4 weeks ago. Planned to overdose on pills. No prior suicide attempt and did not proceed with plan.  Denies active suicidal ideation tonight. Denies homicidal ideation. Thoughts of wanting to hit husband past few days, but has not acted on these thoughts, no homicidal ideation.    [facility] 01/24/2019 09/01/2018 08/18/2018 08/11/2018 08/02/2018  Decreased Interest 3 2 0 1 1  Down, Depressed, Hopeless 3 0 0 0 1  PHQ - 2 Score 6 2 0 1 2  Altered sleeping 3 0 1 - 0  Tired, decreased energy 1 0  0 - 1  Change in appetite 1 0 0 - 0  Feeling bad or failure about yourself  3 2 0 - 0  Trouble concentrating 1 0 0 - 0  Moving slowly or fidgety/restless 2 0 0 - 0  Suicidal thoughts 3 0 0 - 0  PHQ-9 Score 20 4 1  - 3  Difficult doing work/chores - Not difficult at all Not difficult at all - Not difficult at all   No flowsheet data found.           Patient Active Problem List   Diagnosis Date Noted  . Post-operative state 07/25/2017  . Blood loss anemia 03/05/2017  . BV (bacterial vaginosis) 11/27/2012  . UTI (lower urinary tract infection) 11/27/2012  . Migraine  with visual aura 09/19/2011  . Depression 09/19/2011  . Constipation 06/25/2011   Past Medical History:  Diagnosis Date  . Anemia   . Celiac disease   . Elevated blood pressure reading   . Family history of adverse reaction to anesthesia    MOM-HARD TIME WAKING UP  . GERD (gastroesophageal reflux disease)   . Migraine with visual aura    MIGRAINES  . UTI (lower urinary tract infection)    Past Surgical History:  Procedure Laterality Date  . BLADDER SUSPENSION    . EXPLORATORY LAPAROTOMY    . IUD REMOVAL  07/25/2017   Procedure: INTRAUTERINE DEVICE (IUD) REMOVAL;  Surgeon: Harlin Heys, MD;  Location: [facility] ORS;  Service: Gynecology;;  . LAPAROSCOPIC ASSISTED VAGINAL HYSTERECTOMY Bilateral 07/25/2017   Procedure: LAPAROSCOPIC ASSISTED VAGINAL HYSTERECTOMY WITH BILATERAL [place] OOPHERECTOMY;  Surgeon: Harlin Heys, MD;  Location: [facility] ORS;  Service: Gynecology;  Laterality: Bilateral;  . TUBAL LIGATION     Allergies  Allergen Reactions  . Gluten Meal Diarrhea and Nausea Only       . Ondansetron Other (See Comments)    Helps her nausea, but makes HA worse   Prior to Admission medications   Medication Sig Start Date End Date Taking? Authorizing Provider  buPROPion (WELLBUTRIN XL) 300 MG 24 hr tablet TAKE 1 TABLET BY MOUTH EVERY DAY 11/24/18  Yes Wendie Agreste, MD  cephALEXin (KEFLEX) 500 MG capsule Take 1 capsule (500 mg total) by mouth 2 (two) times daily. 12/14/18  Yes Vanessa Kick, MD  hydrOXYzine (ATARAX/VISTARIL) 25 MG tablet Take 1 tablet (25 mg total) by mouth every 6 (six) hours. 01/23/19  Yes Bast, Traci A, NP  omeprazole (PRILOSEC) 20 MG capsule TAKE 1 CAPSULE BY MOUTH EVERY DAY 11/10/18  Yes Stallings, Zoe A, MD  phentermine 15 MG capsule Take 15 mg by mouth every morning.   Yes [provider]  albuterol (PROVENTIL HFA;VENTOLIN HFA) 108 (90 Base) MCG/ACT inhaler Inhale 2 puffs into the lungs every 4 (four) hours as needed for wheezing or shortness  of breath (cough, shortness of breath or wheezing.). Patient not taking: Reported on 09/01/2018 07/04/18 12/14/18  Robyn Haber, MD   Social History   Socioeconomic History  . Marital status: Married    Spouse name: Jenny Reichmann  . Number of children: 3  . Years of education: 14  . Highest education level: Not on file  Occupational History  . Occupation: Personnel officer  Social Needs  . Financial resource strain: Not on file  . Food insecurity    Worry: Not on file    Inability: Not on file  . Transportation needs    Medical: Not on file    Non-medical: Not on  file  Tobacco Use  . Smoking status: Never Smoker  . Smokeless tobacco: Never Used  Substance and Sexual Activity  . Alcohol use: No    Alcohol/week: 0.0 standard drinks  . Drug use: No  . Sexual activity: Yes    Partners: Female    Birth control/protection: Surgical    Comment: INTERCOURSE AGE 82, SEXUAL PARTNERS LEES THAN 5  Lifestyle  . Physical activity    Days per week: Not on file    Minutes per session: Not on file  . Stress: Not on file  Relationships  . Social Herbalist on phone: Not on file    Gets together: Not on file    Attends religious service: Not on file    Active member of club or organization: Not on file    Attends meetings of clubs or organizations: Not on file    Relationship status: Not on file  . Intimate partner violence    Fear of current or ex partner: Not on file    Emotionally abused: Not on file    Physically abused: Not on file    Forced sexual activity: Not on file  Other Topics Concern  . Not on file  Social History Narrative   Lives with her husband and their three children, and her daughter's boyfriend.  Her older son's daughter lives there part-time as well.     Observations/Objective: Calm demeanor with discussion over phone, appropriate responses, without significant tangential responses.  Denies active SI, HI as above.  All questions answered with  understanding of plan expressed and agreed upon.  Assessment and Plan: Severe episode of recurrent major depressive disorder, with psychotic features ([place])  Paranoia (HCC)  Psychophysiological insomnia  -Previous depression, anxiety that was improving, now with acute worsening without known cause.  Associated with some psychotic symptoms at this time as she does endorse possible visual hallucinations that have been ongoing, and internal voice with paranoia symptoms although no true auditory hallucination.  has been experiencing worsening perseveration on paranoia symptoms, and thoughts of possible harm to others without intent/plan.  Brief suicidal ideation few weeks ago, denies active suicidal ideation, intent or plan, and denies any homicidal ideation intent or plan. Denies significant relief with hydroxyzine, current dose Wellbutrin. -Recommended evaluation with [facility] tonight or tomorrow morning to decide on outpatient versus inpatient treatment and adjustment of medications.  ER/911 if acute worsening.  Continue hydroxyzine same dose for now and Wellbutrin same dose for now.  Patient in agreement with plan and plans to be seen at behavioral health.  Address and phone number provided.  Follow Up Instructions: Behavioral health assessment tonight or tomorrow morning recommended.  Follow-up with psychiatry to be determined from the evaluation or I can place referral as needed.   I discussed the assessment and treatment plan with the patient. The patient was provided an opportunity to ask questions and all were answered. The patient agreed with the plan and demonstrated an understanding of the instructions.   The patient was advised to call back or seek an in-person evaluation if the symptoms worsen or if the condition fails to improve as anticipated.  I provided 20 minutes of non-face-to-face time during this encounter. 5 min chart review.   Signed,   Merri Ray, MD [facility].  01/24/19

## 2019-01-25 ENCOUNTER — Encounter (HOSPITAL_COMMUNITY): Payer: Self-pay | Admitting: Clinical

## 2019-01-25 ENCOUNTER — Ambulatory Visit (HOSPITAL_COMMUNITY)
Admission: RE | Admit: 2019-01-25 | Discharge: 2019-01-25 | Disposition: A | Discharge status: Home / Self Care | Payer: PRIVATE HEALTH INSURANCE | Attending: Psychiatry | Admitting: Psychiatry

## 2019-01-25 DIAGNOSIS — F322 Major depressive disorder, single episode, severe without psychotic features: Secondary | ICD-10-CM | POA: Insufficient documentation

## 2019-01-25 NOTE — H&P (Addendum)
Behavioral Health Medical Screening Exam  [name] is an 53 y.o. female.who presents to [facility], voluntarily. Patient denies SI, HI or psychosis. She reports she is here because she has been having paranoid about her husband having an affair for the past 2-3 months. She reports she has no evidence that her husband is having an affair and her husband has consistently denied it. Reports her thoughts has become so bad that she spends hours on her phone looking up addresses and phone numbers of who her spouse may be having an affair with. Reports this has caused her to be more angry and lash out at her spouse. Reports her and spouse being married for 33 years and reports she does believe that this is causes a significant strain on their marriage. She endorses depression    with symptoms of  worthlessness, guilt, irritability, social isolation, anhedonia, fatigue, insomnia, poor concentration, poor appetite, and increased tearfulness since January 2020. Reports she has always struggled with her believe ing that she was not good enough and suffered from poor self-esteem. Reports she does not feel as though she needs inpatient at this time. She does report three weeks ago she experienced suicidal thoughts and thought about overdosing on pills however reports her husband does keep the pills in a safe area. She denies guns in the home. Rreports she began taking Wellbutrin and Vistaril in February and they were effective for a period of time but not any longer. She denies substance use, trauma history, or criminal charges. Her medications are managed by her PCP.    Total Time spent with patient: 20 minutes  Psychiatric Specialty Exam: Physical Exam  Vitals reviewed. Constitutional: She is oriented to person, place, and time.  Neurological: She is alert and oriented to person, place, and time.    ROS  There were no vitals taken for this visit.There is no height or weight on file to calculate BMI.  General  Appearance: Casual  Eye Contact:  Good  Speech:  Clear and Coherent and Normal Rate  Volume:  Decreased  Mood:  Anxious and Depressed  Affect:  Depressed  Thought Process:  Coherent, Linear and Descriptions of Associations: Intact  Orientation:  Full (Time, Place, and Person)  Thought Content:  Rumination  Suicidal Thoughts:  No  Homicidal Thoughts:  No  Memory:  Immediate;   Fair Recent;   Fair  Judgement:  Impaired  Insight:  Shallow  Psychomotor Activity:  Normal  Concentration: Concentration: Fair and Attention Span: Fair  Recall:  [employer] of Knowledge:Fair  Language: Good  Akathisia:  Negative  Handed:  Right  AIMS (if indicated):     Assets:  Communication Skills Resilience Social Support  Sleep:       Musculoskeletal: Strength & Muscle Tone: within normal limits Gait & Station: normal Patient leans: N/A  There were no vitals taken for this visit.  Recommendations:  Based on my evaluation the patient does not appear to have an emergency medical condition.  No evidence of imminent risk to self or others at present.   Patient does not meet criteria for psychiatric inpatient admission. Reccommended to continue follow-up with her PCP to discuss medication as she feels as though they are not helpful. She has been given resources to establish outpatient psychiatry and therapy/ IOP and Partial Hospitalization was highly recommended.     Mordecai Maes, NP 01/25/2019, 10:26 AM

## 2019-01-25 NOTE — BH Assessment (Signed)
Assessment Note  [name] is an 53 y.o. female presenting voluntarily to [facility] for assessment. She is accompanied by her mother, Hamilton Capri, who waits in the lobby during assessment. Patient states for the past 2-3 months she has been paranoid that her husband is having an affair, despite not having any evidence of this and him denying it. She reports she spends hours looking up address and phone numbers that may be suspicious. She reports that she lashes out in anger toward her husband frequently. She reports this has strained their 56 year marriage. She endorses depressive symptoms of worthlessness, guilt, irritability, social isolation, anhedonia, fatigue, insomnia, poor concentration, poor appetite, and increased tearfulness since January 2020. Patient denies current SI, however reports 3 weeks ago she thought of overdosing but the medications in her home are locked up. She denies HI/AVH. Patient reports she began taking Wellbutrin and Vistaril in February and they were effective for a period of time but not any longer. She denies substance use, trauma history, or criminal charges.  Patient is alert and oriented x 4. She is dressed appropriately. Patient's speech is logical, eye contact is good, and thoughts are organized. Patient's mood is depressed and her affect is congruent. Her insight, judgement, and impulse control are fair. She does not appear to be responding to internal stimuli or experiencing delusional thought content.  Diagnosis: F32.2 MDD, single episode, severe  Past Medical History:  Past Medical History:  Diagnosis Date  . Anemia   . Celiac disease   . Elevated blood pressure reading   . Family history of adverse reaction to anesthesia    MOM-HARD TIME WAKING UP  . GERD (gastroesophageal reflux disease)   . Migraine with visual aura    MIGRAINES  . UTI (lower urinary tract infection)     Past Surgical History:  Procedure Laterality Date  . BLADDER SUSPENSION    .  EXPLORATORY LAPAROTOMY    . IUD REMOVAL  07/25/2017   Procedure: INTRAUTERINE DEVICE (IUD) REMOVAL;  Surgeon: Harlin Heys, MD;  Location: [facility] ORS;  Service: Gynecology;;  . LAPAROSCOPIC ASSISTED VAGINAL HYSTERECTOMY Bilateral 07/25/2017   Procedure: LAPAROSCOPIC ASSISTED VAGINAL HYSTERECTOMY WITH BILATERAL [place] OOPHERECTOMY;  Surgeon: Harlin Heys, MD;  Location: [facility] ORS;  Service: Gynecology;  Laterality: Bilateral;  . TUBAL LIGATION      Family History:  Family History  Problem Relation Age of Onset  . Diabetes Son 63       type 1    Social History:  reports that she has never smoked. She has never used smokeless tobacco. She reports that she does not drink alcohol or use drugs.  Additional Social History:  Alcohol / Drug Use Pain Medications: see MAR Prescriptions: see MAR Over the Counter: see MAR History of alcohol / drug use?: No history of alcohol / drug abuse  CIWA:   COWS:    Allergies:  Allergies  Allergen Reactions  . Gluten Meal Diarrhea and Nausea Only       . Ondansetron Other (See Comments)    Helps her nausea, but makes HA worse    Home Medications: (Not in a hospital admission)   OB/GYN Status:  No LMP recorded (lmp unknown). Patient has had a hysterectomy.  General Assessment Data Location of Assessment: [facility] Assessment Services TTS Assessment: In system Is this a Tele or Face-to-Face Assessment?: Face-to-Face Is this an Initial Assessment or a Re-assessment for this encounter?: Initial Assessment Patient Accompanied by:: N/A Language Other than English: No Living  Arrangements: (her home) What gender do you identify as?: Female Marital status: Married [place] name: Arnoldo Morale Pregnancy Status: No Living Arrangements: Spouse/significant other Can pt return to current living arrangement?: Yes Admission Status: Voluntary Is patient capable of signing voluntary admission?: Yes Referral Source: Self/Family/Friend Insurance type:  Automotive engineer     Crisis Care Plan Living Arrangements: Spouse/significant other Legal Guardian: (none) Name of Psychiatrist: none Name of Therapist: none  Education Status Is patient currently in school?: No Is the patient employed, unemployed or receiving disability?: Employed  Risk to self with the past 6 months Suicidal Ideation: No-Not Currently/Within Last 6 Months Has patient been a risk to self within the past 6 months prior to admission? : No Suicidal Intent: No Has patient had any suicidal intent within the past 6 months prior to admission? : No Is patient at risk for suicide?: No Suicidal Plan?: No-Not Currently/Within Last 6 Months Has patient had any suicidal plan within the past 6 months prior to admission? : Yes Access to Means: No What has been your use of drugs/alcohol within the last 12 months?: denies Previous Attempts/Gestures: No How many times?: 0 Other Self Harm Risks: none Triggers for Past Attempts: None known Intentional Self Injurious Behavior: None Family Suicide History: No Recent stressful life event(s): Conflict (Comment)(with husband) Persecutory voices/beliefs?: No Depression: Yes Depression Symptoms: Despondent, Insomnia, Tearfulness, Isolating, Guilt, Fatigue, Loss of interest in usual pleasures, Feeling worthless/self pity, Feeling angry/irritable Substance abuse history and/or treatment for substance abuse?: No Suicide prevention information given to non-admitted patients: Not applicable  Risk to Others within the past 6 months Homicidal Ideation: No Does patient have any lifetime risk of violence toward others beyond the six months prior to admission? : No Thoughts of Harm to Others: No-Not Currently Present/Within Last 6 Months Current Homicidal Intent: No Current Homicidal Plan: No Access to Homicidal Means: No Describe Access to Homicidal Means: (NA) Identified Victim: NA History of harm to others?: No Assessment of Violence:  None Noted Does patient have access to weapons?: No Criminal Charges Pending?: No Does patient have a court date: No Is patient on probation?: No  Psychosis Hallucinations: None noted Delusions: Unspecified  Mental Status Report Appearance/Hygiene: Unremarkable Eye Contact: Good Motor Activity: Freedom of movement Speech: Logical/coherent Level of Consciousness: Alert Mood: Depressed Affect: Depressed Anxiety Level: Moderate Thought Processes: Coherent, Relevant Judgement: Impaired Orientation: Person, Place, Time, Situation Obsessive Compulsive Thoughts/Behaviors: Moderate  Cognitive Functioning Concentration: Normal Memory: Recent Intact, Remote Intact Is patient IDD: No Insight: Fair Impulse Control: Poor Appetite: Poor Have you had any weight changes? : Loss Amount of the weight change? (lbs): (UTA) Sleep: Decreased Total Hours of Sleep: 1 Vegetative Symptoms: None  ADLScreening [facility] Assessment Services) Patient's cognitive ability adequate to safely complete daily activities?: Yes Patient able to express need for assistance with ADLs?: Yes Independently performs ADLs?: Yes (appropriate for developmental age)  Prior Inpatient Therapy Prior Inpatient Therapy: No  Prior Outpatient Therapy Prior Outpatient Therapy: No Does patient have an ACCT team?: No Does patient have Intensive In-House Services?  : No Does patient have [facility] services? : No Does patient have P4CC services?: No  ADL Screening (condition at time of admission) Patient's cognitive ability adequate to safely complete daily activities?: Yes Is the patient deaf or have difficulty hearing?: No Does the patient have difficulty seeing, even when wearing glasses/contacts?: No Does the patient have difficulty concentrating, remembering, or making decisions?: No Patient able to express need for assistance with ADLs?: Yes Does the patient have  difficulty dressing or bathing?: No Independently  performs ADLs?: Yes (appropriate for developmental age) Does the patient have difficulty walking or climbing stairs?: No Weakness of Legs: None Weakness of Arms/Hands: None  Home Assistive Devices/Equipment Home Assistive Devices/Equipment: None  Therapy Consults (therapy consults require a physician order) PT Evaluation Needed: No OT Evalulation Needed: No SLP Evaluation Needed: No Abuse/Neglect Assessment (Assessment to be complete while patient is alone) Abuse/Neglect Assessment Can Be Completed: Yes Physical Abuse: Denies Verbal Abuse: Denies Sexual Abuse: Denies Exploitation of patient/patient's resources: Denies Values / Beliefs Cultural Requests During Hospitalization: None Spiritual Requests During Hospitalization: None Consults Spiritual Care Consult Needed: No Social Work Consult Needed: No Regulatory affairs officer (For Healthcare) Does Patient Have a Medical Advance Directive?: No Would patient like information on creating a medical advance directive?: No - Patient declined          Disposition: Per Mordecai Maes, NP patient does not meet in patient criteria. Patient discharged with outpatient resources. Disposition Initial Assessment Completed for this Encounter: Yes Disposition of Patient: Discharge Patient refused recommended treatment: No Mode of transportation if patient is discharged/movement?: Car Patient referred to: Outpatient clinic referral  On Site Evaluation by:   Reviewed with Physician:    Orvis Brill 01/25/2019 10:01 AM

## 2019-02-16 ENCOUNTER — Encounter: Payer: Self-pay | Admitting: Primary Care

## 2019-02-16 ENCOUNTER — Ambulatory Visit: Payer: PRIVATE HEALTH INSURANCE | Admitting: Primary Care

## 2019-02-16 ENCOUNTER — Other Ambulatory Visit: Payer: Self-pay

## 2019-02-16 VITALS — BP 164/120 | HR 92 | Temp 97.1°F | Ht 60.25 in | Wt 145.0 lb

## 2019-02-16 DIAGNOSIS — N3090 Cystitis, unspecified without hematuria: Secondary | ICD-10-CM | POA: Diagnosis not present

## 2019-02-16 DIAGNOSIS — R32 Unspecified urinary incontinence: Secondary | ICD-10-CM

## 2019-02-16 DIAGNOSIS — F4323 Adjustment disorder with mixed anxiety and depressed mood: Secondary | ICD-10-CM

## 2019-02-16 DIAGNOSIS — R350 Frequency of micturition: Secondary | ICD-10-CM | POA: Diagnosis not present

## 2019-02-16 DIAGNOSIS — I10 Essential (primary) hypertension: Secondary | ICD-10-CM | POA: Diagnosis not present

## 2019-02-16 DIAGNOSIS — G43109 Migraine with aura, not intractable, without status migrainosus: Secondary | ICD-10-CM

## 2019-02-16 LAB — COMPREHENSIVE METABOLIC PANEL
ALT: 19 U/L (ref 0–35)
AST: 17 U/L (ref 0–37)
Albumin: 3.9 g/dL (ref 3.5–5.2)
Alkaline Phosphatase: 87 U/L (ref 39–117)
BUN: 15 mg/dL (ref 6–23)
CO2: 30 mEq/L (ref 19–32)
Calcium: 9 mg/dL (ref 8.4–10.5)
Chloride: 106 mEq/L (ref 96–112)
Creatinine, Ser: 0.75 mg/dL (ref 0.40–1.20)
GFR: 81.05 mL/min (ref >60.00)
Glucose, Bld: 85 mg/dL (ref 70–99)
Potassium: 4.1 mEq/L (ref 3.5–5.1)
Sodium: 142 mEq/L (ref 135–145)
Total Bilirubin: 0.4 mg/dL (ref 0.2–1.2)
Total Protein: 6.4 g/dL (ref 6.0–8.3)

## 2019-02-16 LAB — POC URINALSYSI DIPSTICK (AUTOMATED)
Bilirubin, UA: NEGATIVE
Blood, UA: POSITIVE
Glucose, UA: NEGATIVE
Ketones, UA: NEGATIVE
Leukocytes, UA: NEGATIVE
Nitrite, UA: POSITIVE
Protein, UA: NEGATIVE
Spec Grav, UA: 1.015 (ref 1.010–1.025)
Urobilinogen, UA: 0.2 E.U./dL
pH, UA: 8 (ref 5.0–8.0)

## 2019-02-16 LAB — CBC
HCT: 42 % (ref 36.0–46.0)
Hemoglobin: 14.3 g/dL (ref 12.0–15.0)
MCHC: 34.1 g/dL (ref 30.0–36.0)
MCV: 96.2 fl (ref 78.0–100.0)
Platelets: 238 10*3/uL (ref 150.0–400.0)
RBC: 4.36 Mil/uL (ref 3.87–5.11)
RDW: 13.5 % (ref 11.5–15.5)
WBC: 4.3 10*3/uL (ref 4.0–10.5)

## 2019-02-16 LAB — HEMOGLOBIN A1C: Hgb A1c MFr Bld: 5.5 % (ref 4.6–6.5)

## 2019-02-16 LAB — LIPID PANEL
Cholesterol: 201 mg/dL — ABNORMAL HIGH (ref 0–200)
HDL: 43.3 mg/dL (ref >39.00)
LDL Cholesterol: 129 mg/dL — ABNORMAL HIGH (ref 0–99)
NonHDL: 157.67
Total CHOL/HDL Ratio: 5
Triglycerides: 145 mg/dL (ref 0.0–149.0)
VLDL: 29 mg/dL (ref 0.0–40.0)

## 2019-02-16 MED ORDER — LISINOPRIL 20 MG PO TABS: 20.0000 mg | ORAL_TABLET | Freq: Every day | ORAL | 0 refills | Status: DC

## 2019-02-16 NOTE — Progress Notes (Signed)
Subjective:    Patient ID: [name], female    DOB: 18-Aug-1965, 53 y.o.   MRN: [id number]  HPI  [name] is a 53 year old female who presents today to establish care and discuss the problems mentioned below. Will obtain/review records. Her mother is with her today who is helping to provide information for HPI.  1) Anxiety and Depression: Currently managed on olanzapine 5 mg, bupropion XL 150 mg, hydroxyzine 25 mg PRN. She is currently following with psychiatry through [facility], recently established with last appointment being yesterday. She is following with therapy.   2) Migraines: Currently managed on Zonisamide 25 mg four times daily. She is following with the headache clinic with last visit being in March/April 2020.  3) Urinary Frequency: Chronic that dates back to childhood, also with incontinence. Also with foul smell. Chronic for the last 2-3 months that occurs daily. She doesn't drink much water, she is drinking mostly soda. She denies vaginal discharge, vaginal itching, hematuria, dysuria.  History of recurrent UTI's with last one being 1-2 months ago, she is getting UTI's once monthly but doesn't always seek treatment.  She saw a Urologist years ago for incontinence, underwent bladder sling at the time. She doesn't believe the bladder sling is effective. No recent Urology evaluation.   4) Elevated Blood Pressure: Chronic elevated readings for over one year. She is checking her BP at home and is getting readings of 140's-150's/90's-100's. She has a strong family history of stroke and hypertension on both sides of the family. She does have headaches that have increased. Denies chest pain.   BP Readings from Last 3 Encounters:  02/16/19 (!) 164/120  01/23/19 (!) 149/105  12/14/18 126/89     Review of Systems  Respiratory: Negative for shortness of breath.   Cardiovascular: Negative for chest pain.  Gastrointestinal: Negative for abdominal pain.  Genitourinary: Positive  for frequency. Negative for dysuria, flank pain and hematuria.       Foul smelling urine  Allergic/Immunologic: Negative for environmental allergies.  Neurological: Positive for headaches.  Psychiatric/Behavioral:       Following with psychiatry, see HPI       Past Medical History:  Diagnosis Date  . Anemia   . Celiac disease   . Essential hypertension   . Family history of adverse reaction to anesthesia    MOM-HARD TIME WAKING UP  . GERD (gastroesophageal reflux disease)   . Migraine with visual aura    MIGRAINES  . UTI (lower urinary tract infection)      Social History   Socioeconomic History  . Marital status: Married    Spouse name: Jenny Reichmann  . Number of children: 3  . Years of education: 37  . Highest education level: Not on file  Occupational History  . Occupation: Personnel officer  Social Needs  . Financial resource strain: Not on file  . Food insecurity    Worry: Not on file    Inability: Not on file  . Transportation needs    Medical: Not on file    Non-medical: Not on file  Tobacco Use  . Smoking status: Never Smoker  . Smokeless tobacco: Never Used  Substance and Sexual Activity  . Alcohol use: No    Alcohol/week: 0.0 standard drinks  . Drug use: No  . Sexual activity: Yes    Partners: Female    Birth control/protection: Surgical    Comment: INTERCOURSE AGE 74, SEXUAL PARTNERS LEES THAN 6  Lifestyle  . Physical  activity    Days per week: Not on file    Minutes per session: Not on file  . Stress: Not on file  Relationships  . Social Herbalist on phone: Not on file    Gets together: Not on file    Attends religious service: Not on file    Active member of club or organization: Not on file    Attends meetings of clubs or organizations: Not on file    Relationship status: Not on file  . Intimate partner violence    Fear of current or ex partner: Not on file    Emotionally abused: Not on file    Physically abused: Not on  file    Forced sexual activity: Not on file  Other Topics Concern  . Not on file  Social History Narrative   Lives with her husband and their three children, and her daughter's boyfriend.  Her older son's daughter lives there part-time as well.    Past Surgical History:  Procedure Laterality Date  . BLADDER SUSPENSION    . EXPLORATORY LAPAROTOMY    . IUD REMOVAL  07/25/2017   Procedure: INTRAUTERINE DEVICE (IUD) REMOVAL;  Surgeon: Harlin Heys, MD;  Location: [facility] ORS;  Service: Gynecology;;  . LAPAROSCOPIC ASSISTED VAGINAL HYSTERECTOMY Bilateral 07/25/2017   Procedure: LAPAROSCOPIC ASSISTED VAGINAL HYSTERECTOMY WITH BILATERAL Roberts OOPHERECTOMY;  Surgeon: Harlin Heys, MD;  Location: [facility] ORS;  Service: Gynecology;  Laterality: Bilateral;  . TUBAL LIGATION      Family History  Problem Relation Age of Onset  . Hypertension Mother   . Stroke Mother   . Diabetes Son 12       type 1  . Hypertension Maternal Grandmother   . Colon cancer Maternal Grandmother   . Breast cancer Maternal Grandmother   . Skin cancer Maternal Grandmother   . Hypertension Maternal Grandfather   . Hypertension Paternal Grandmother   . Diabetes Paternal Grandmother   . Hypertension Paternal Grandfather     Allergies  Allergen Reactions  . Gluten Meal Diarrhea and Nausea Only       . Ondansetron Other (See Comments)    Helps her nausea, but makes HA worse    Current Outpatient Medications on File Prior to Visit  Medication Sig Dispense Refill  . buPROPion (WELLBUTRIN XL) 150 MG 24 hr tablet Take 150 mg by mouth daily.    . hydrOXYzine (ATARAX/VISTARIL) 25 MG tablet Take 1 tablet (25 mg total) by mouth every 6 (six) hours. 12 tablet 0  . omeprazole (PRILOSEC) 20 MG capsule TAKE 1 CAPSULE BY MOUTH EVERY DAY 90 capsule 1  . zonisamide (ZONEGRAN) 25 MG capsule TAKE 4 CAPSULES BY MOUTH EVERY DAY    . [DISCONTINUED] albuterol (PROVENTIL HFA;VENTOLIN HFA) 108 (90 Base) MCG/ACT inhaler Inhale  2 puffs into the lungs every 4 (four) hours as needed for wheezing or shortness of breath (cough, shortness of breath or wheezing.). (Patient not taking: Reported on 09/01/2018) 1 Inhaler 1   No current facility-administered medications on file prior to visit.     BP (!) 164/120   Pulse 92   Temp (!) 97.1 F (36.2 C) (Temporal)   Ht 5' 0.25" (1.53 m)   Wt 145 lb (65.8 kg)   LMP  (LMP Unknown)   SpO2 98%   BMI 28.08 kg/m    Objective:   Physical Exam  Constitutional: She appears well-nourished.  Neck: Neck supple.  Cardiovascular: Normal rate and regular rhythm.  Respiratory:  Effort normal and breath sounds normal.  Skin: Skin is warm and dry.  Psychiatric: She has a normal mood and affect.           Assessment & Plan:

## 2019-02-16 NOTE — Patient Instructions (Signed)
Stop by the lab prior to leaving today. I will notify you of your results once received.   You will be contacted regarding your referral to Urology.  Please let us know if you have not been contacted within two weeks.   Start lisinopril 20 mg daily for blood pressure. Take this once daily.  Start monitoring your blood pressure daily, around the same time of day, for the next 2-3 weeks.  Ensure that you have rested for 30 minutes prior to checking your blood pressure. Record your readings and bring them to your next visit.  Schedule a follow up visit for 2-3 weeks for blood pressure check.  It was a pleasure to meet you today! Please don't hesitate to call or message me with any questions. Welcome to [facility]!

## 2019-02-18 ENCOUNTER — Other Ambulatory Visit: Payer: Self-pay | Admitting: Primary Care

## 2019-02-18 ENCOUNTER — Encounter: Payer: Self-pay | Admitting: Primary Care

## 2019-02-18 DIAGNOSIS — I10 Essential (primary) hypertension: Secondary | ICD-10-CM | POA: Insufficient documentation

## 2019-02-18 DIAGNOSIS — N3000 Acute cystitis without hematuria: Secondary | ICD-10-CM

## 2019-02-18 DIAGNOSIS — R32 Unspecified urinary incontinence: Secondary | ICD-10-CM | POA: Insufficient documentation

## 2019-02-18 LAB — URINE CULTURE
MICRO NUMBER:: 1073320
SPECIMEN QUALITY:: ADEQUATE

## 2019-02-18 MED ORDER — NITROFURANTOIN MONOHYD MACRO 100 MG PO CAPS: 100.0000 mg | ORAL_CAPSULE | Freq: Two times a day (BID) | ORAL | 0 refills | Status: DC

## 2019-02-18 NOTE — Assessment & Plan Note (Signed)
Chronic, following with headache clinic. Continue current regimen.

## 2019-02-18 NOTE — Assessment & Plan Note (Signed)
Never treated. Numerous elevated readings in chart, also with elevated home readings.  Rx for lisinopril 20 mg sent to pharmacy. She will monitor home readings. Follow up in 2-3 weeks for BP check.

## 2019-02-18 NOTE — Assessment & Plan Note (Signed)
Chronic for years, no recent Urology evaluation. UA today with nitrites and blood. Culture positive for E coli, resistance noted. Rx for Macrobid course sent to pharmacy. Referral placed to Urology.

## 2019-02-18 NOTE — Assessment & Plan Note (Signed)
Following with psychiatry through [facility]. Continue current regimen.

## 2019-02-18 NOTE — Assessment & Plan Note (Signed)
History of bladder sling. Referral placed to Urology.

## 2019-02-20 ENCOUNTER — Emergency Department (HOSPITAL_COMMUNITY)
Admission: EM | Admit: 2019-02-20 | Discharge: 2019-02-21 | Discharge status: Against Medical Advice | Payer: PRIVATE HEALTH INSURANCE | Attending: Emergency Medicine | Admitting: Emergency Medicine

## 2019-02-20 ENCOUNTER — Encounter (HOSPITAL_COMMUNITY): Payer: Self-pay | Admitting: Emergency Medicine

## 2019-02-20 ENCOUNTER — Other Ambulatory Visit: Payer: Self-pay

## 2019-02-20 ENCOUNTER — Emergency Department (HOSPITAL_COMMUNITY): Payer: PRIVATE HEALTH INSURANCE

## 2019-02-20 DIAGNOSIS — R200 Anesthesia of skin: Secondary | ICD-10-CM | POA: Diagnosis present

## 2019-02-20 DIAGNOSIS — Z5321 Procedure and treatment not carried out due to patient leaving prior to being seen by health care provider: Secondary | ICD-10-CM | POA: Insufficient documentation

## 2019-02-20 LAB — DIFFERENTIAL
Abs Immature Granulocytes: 0.02 10*3/uL (ref 0.00–0.07)
Basophils Absolute: 0 10*3/uL (ref 0.0–0.1)
Basophils Relative: 1 %
Eosinophils Absolute: 0.4 10*3/uL (ref 0.0–0.5)
Eosinophils Relative: 8 %
Immature Granulocytes: 0 %
Lymphocytes Relative: 23 %
Lymphs Abs: 1.1 10*3/uL (ref 0.7–4.0)
Monocytes Absolute: 0.3 10*3/uL (ref 0.1–1.0)
Monocytes Relative: 7 %
Neutro Abs: 3 10*3/uL (ref 1.7–7.7)
Neutrophils Relative %: 61 %

## 2019-02-20 LAB — I-STAT BETA HCG BLOOD, ED (MC, WL, AP ONLY): I-stat hCG, quantitative: <5 m[IU]/mL (ref <5)

## 2019-02-20 LAB — CBC
HCT: 43.5 % (ref 36.0–46.0)
Hemoglobin: 14.5 g/dL (ref 12.0–15.0)
MCH: 32.7 pg (ref 26.0–34.0)
MCHC: 33.3 g/dL (ref 30.0–36.0)
MCV: 98.2 fL (ref 80.0–100.0)
Platelets: 261 10*3/uL (ref 150–400)
RBC: 4.43 MIL/uL (ref 3.87–5.11)
RDW: 13.2 % (ref 11.5–15.5)
WBC: 4.9 10*3/uL (ref 4.0–10.5)
nRBC: 0 % (ref 0.0–0.2)

## 2019-02-20 LAB — COMPREHENSIVE METABOLIC PANEL
ALT: 18 U/L (ref 0–44)
AST: 19 U/L (ref 15–41)
Albumin: 3.5 g/dL (ref 3.5–5.0)
Alkaline Phosphatase: 92 U/L (ref 38–126)
Anion gap: 9 (ref 5–15)
BUN: 13 mg/dL (ref 6–20)
CO2: 23 mmol/L (ref 22–32)
Calcium: 8.9 mg/dL (ref 8.9–10.3)
Chloride: 106 mmol/L (ref 98–111)
Creatinine, Ser: 0.83 mg/dL (ref 0.44–1.00)
GFR calc Af Amer: >60 mL/min (ref >60)
GFR calc non Af Amer: >60 mL/min (ref >60)
Glucose, Bld: 106 mg/dL — ABNORMAL HIGH (ref 70–99)
Potassium: 3.6 mmol/L (ref 3.5–5.1)
Sodium: 138 mmol/L (ref 135–145)
Total Bilirubin: 0.3 mg/dL (ref 0.3–1.2)
Total Protein: 6.1 g/dL — ABNORMAL LOW (ref 6.5–8.1)

## 2019-02-20 LAB — I-STAT CHEM 8, ED
BUN: 14 mg/dL (ref 6–20)
Calcium, Ion: 1.17 mmol/L (ref 1.15–1.40)
Chloride: 107 mmol/L (ref 98–111)
Creatinine, Ser: 0.7 mg/dL (ref 0.44–1.00)
Glucose, Bld: 102 mg/dL — ABNORMAL HIGH (ref 70–99)
HCT: 43 % (ref 36.0–46.0)
Hemoglobin: 14.6 g/dL (ref 12.0–15.0)
Potassium: 3.7 mmol/L (ref 3.5–5.1)
Sodium: 142 mmol/L (ref 135–145)
TCO2: 24 mmol/L (ref 22–32)

## 2019-02-20 LAB — PROTIME-INR
INR: 0.9 (ref 0.8–1.2)
Prothrombin Time: 12.2 seconds (ref 11.4–15.2)

## 2019-02-20 LAB — APTT: aPTT: 26 seconds (ref 24–36)

## 2019-02-20 MED ORDER — SODIUM CHLORIDE 0.9% FLUSH: 3.0000 mL | Freq: Once | INTRAVENOUS | Status: DC

## 2019-02-20 NOTE — ED Triage Notes (Signed)
Pt states she started having some numbness a few days around on the left side of her face. Her BP has been running high. Pt started on BP meds on Friday at PCP. Pt started having slurred speech on Sunday. No slurred speech now. Denies weakness in arms or legs. Pt fell on Sunday- felt weak/lightheaded. No LOC. Did not hit head. Not on blood thinners.

## 2019-02-20 NOTE — ED Notes (Signed)
Pt stated she has work at [employer] so she will look in her mychart for results and will return should anything worsen

## 2019-02-22 ENCOUNTER — Other Ambulatory Visit: Payer: Self-pay

## 2019-02-22 ENCOUNTER — Encounter: Payer: Self-pay | Admitting: Emergency Medicine

## 2019-02-22 ENCOUNTER — Emergency Department
Admission: EM | Admit: 2019-02-22 | Discharge: 2019-02-22 | Disposition: A | Discharge status: Home / Self Care | Payer: PRIVATE HEALTH INSURANCE | Attending: Emergency Medicine | Admitting: Emergency Medicine

## 2019-02-22 ENCOUNTER — Emergency Department: Payer: PRIVATE HEALTH INSURANCE

## 2019-02-22 DIAGNOSIS — R519 Headache, unspecified: Secondary | ICD-10-CM | POA: Diagnosis not present

## 2019-02-22 DIAGNOSIS — Z79899 Other long term (current) drug therapy: Secondary | ICD-10-CM | POA: Insufficient documentation

## 2019-02-22 DIAGNOSIS — R4781 Slurred speech: Secondary | ICD-10-CM | POA: Insufficient documentation

## 2019-02-22 DIAGNOSIS — R200 Anesthesia of skin: Secondary | ICD-10-CM | POA: Insufficient documentation

## 2019-02-22 DIAGNOSIS — I10 Essential (primary) hypertension: Secondary | ICD-10-CM | POA: Diagnosis not present

## 2019-02-22 HISTORY — DX: Depression, unspecified: F32.A

## 2019-02-22 LAB — CBC
HCT: 43 % (ref 36.0–46.0)
Hemoglobin: 15 g/dL (ref 12.0–15.0)
MCH: 32.5 pg (ref 26.0–34.0)
MCHC: 34.9 g/dL (ref 30.0–36.0)
MCV: 93.3 fL (ref 80.0–100.0)
Platelets: 248 10*3/uL (ref 150–400)
RBC: 4.61 MIL/uL (ref 3.87–5.11)
RDW: 13 % (ref 11.5–15.5)
WBC: 5.3 10*3/uL (ref 4.0–10.5)
nRBC: 0 % (ref 0.0–0.2)

## 2019-02-22 LAB — URINALYSIS, COMPLETE (UACMP) WITH MICROSCOPIC
Bacteria, UA: NONE SEEN
Bilirubin Urine: NEGATIVE
Glucose, UA: NEGATIVE mg/dL
Hgb urine dipstick: NEGATIVE
Ketones, ur: NEGATIVE mg/dL
Leukocytes,Ua: NEGATIVE
Nitrite: NEGATIVE
Protein, ur: NEGATIVE mg/dL
Specific Gravity, Urine: 1.014 (ref 1.005–1.030)
pH: 7 (ref 5.0–8.0)

## 2019-02-22 LAB — COMPREHENSIVE METABOLIC PANEL
ALT: 22 U/L (ref 0–44)
AST: 23 U/L (ref 15–41)
Albumin: 3.8 g/dL (ref 3.5–5.0)
Alkaline Phosphatase: 94 U/L (ref 38–126)
Anion gap: 10 (ref 5–15)
BUN: 14 mg/dL (ref 6–20)
CO2: 23 mmol/L (ref 22–32)
Calcium: 9 mg/dL (ref 8.9–10.3)
Chloride: 108 mmol/L (ref 98–111)
Creatinine, Ser: 0.72 mg/dL (ref 0.44–1.00)
GFR calc Af Amer: >60 mL/min (ref >60)
GFR calc non Af Amer: >60 mL/min (ref >60)
Glucose, Bld: 116 mg/dL — ABNORMAL HIGH (ref 70–99)
Potassium: 3.6 mmol/L (ref 3.5–5.1)
Sodium: 141 mmol/L (ref 135–145)
Total Bilirubin: 0.7 mg/dL (ref 0.3–1.2)
Total Protein: 6.7 g/dL (ref 6.5–8.1)

## 2019-02-22 NOTE — Discharge Instructions (Addendum)
I spoke with neurology.  He is very happy that the MRI is negative.  He wants you to follow-up in the office.  I have included the names of to the neurologists in town.  If you give them a call and let them know you are in the emergency room and Dr. Irish Elders who is the gentleman I spoke with wanted you to follow-up outpatient they should be able to get you in relatively quickly.  He suggest getting an EEG to make sure these have not any kind of unusual seizures.  Please continue taking your antihypertensive medicines and return for any further problems.  Especially return if you have worsening trouble with your speech or numbness.  Your blood pressures here in the emergency room of been good.

## 2019-02-22 NOTE — ED Triage Notes (Signed)
Pt here for intermittent headache/head heaviness.  Was at [facility] 2 days ago for same sx along with left facial numbness and slurred speech.  Speech currently clear.  No facial droop.  Pt c/o generalized weakness.  Equal grip strength.  Reports bp elevated at home.  bp WNL here, had pt put her wrist monitor on and take; BP with pts cuff inaccurate compared to ED result X 2 (pts wrist monitor 145/110).

## 2019-02-22 NOTE — ED Notes (Signed)
Patient is alert and in nad.  fam member has noticed that she has been stumbling at times.  Pt says she did not notice this.  alsos says she has periods of slow thought process and slurred speech. Pt able to move all extremities without weakness.  Says slight numb feeling under and around left eye.

## 2019-02-22 NOTE — ED Provider Notes (Signed)
[facility] Emergency Department Provider Note   ____________________________________________   First MD Initiated Contact with Patient 02/22/19 1323     (approximate)  I have reviewed the triage vital signs and the nursing notes.   HISTORY  Chief Complaint Hypertension    HPI [name] is a 53 y.o. female who comes in with her mother.  She has had hypertension for some time but only recently started to take medicines.  She complains of intermittent headache and word finding difficulty left-sided facial numbness and trouble speaking slurry speech at times.  She was seen at [facility] 2 days ago with a negative CT scan there is no physician's note in the computer that I can find.  She had more trouble today with a lot of word finding difficulty and some left-sided facial numbness.  She also had some slurry speech on the way to the emergency room today.  She currently does not have any slurry speech.  She is having a little bit of word finding difficulty.         Past Medical History:  Diagnosis Date  . Anemia   . BV (bacterial vaginosis) 11/27/2012  . Celiac disease   . Depression   . Essential hypertension   . Family history of adverse reaction to anesthesia    MOM-HARD TIME WAKING UP  . GERD (gastroesophageal reflux disease)   . Migraine with visual aura    MIGRAINES  . UTI (lower urinary tract infection)     Patient Active Problem List   Diagnosis Date Noted  . Urinary incontinence 02/18/2019  . Essential hypertension 02/18/2019  . Recurrent cystitis 11/27/2012  . Migraine with visual aura 09/19/2011  . Adjustment disorder with mixed anxiety and depressed mood 09/19/2011    Past Surgical History:  Procedure Laterality Date  . BLADDER SUSPENSION    . EXPLORATORY LAPAROTOMY    . IUD REMOVAL  07/25/2017   Procedure: INTRAUTERINE DEVICE (IUD) REMOVAL;  Surgeon: Harlin Heys, MD;  Location: [facility] ORS;  Service: Gynecology;;  .  LAPAROSCOPIC ASSISTED VAGINAL HYSTERECTOMY Bilateral 07/25/2017   Procedure: LAPAROSCOPIC ASSISTED VAGINAL HYSTERECTOMY WITH BILATERAL [place] OOPHERECTOMY;  Surgeon: Harlin Heys, MD;  Location: [facility] ORS;  Service: Gynecology;  Laterality: Bilateral;  . TUBAL LIGATION      Prior to Admission medications   Medication Sig Start Date End Date Taking? Authorizing Provider  buPROPion (WELLBUTRIN XL) 150 MG 24 hr tablet Take 150 mg by mouth daily.   Yes [provider]  hydrOXYzine (ATARAX/VISTARIL) 25 MG tablet Take 1 tablet (25 mg total) by mouth every 6 (six) hours. 01/23/19  Yes Bast, Traci A, NP  lisinopril (ZESTRIL) 20 MG tablet Take 1 tablet (20 mg total) by mouth daily. For blood pressure 02/16/19  Yes [name], NP  nitrofurantoin, macrocrystal-monohydrate, (MACROBID) 100 MG capsule Take 1 capsule (100 mg total) by mouth 2 (two) times daily. 02/18/19  Yes [name], NP  omeprazole (PRILOSEC) 20 MG capsule TAKE 1 CAPSULE BY MOUTH EVERY DAY 11/10/18  Yes Delia Chimes A, MD  zonisamide (ZONEGRAN) 25 MG capsule TAKE 4 CAPSULES BY MOUTH EVERY DAY 01/13/19  Yes [provider]  albuterol (PROVENTIL HFA;VENTOLIN HFA) 108 (90 Base) MCG/ACT inhaler Inhale 2 puffs into the lungs every 4 (four) hours as needed for wheezing or shortness of breath (cough, shortness of breath or wheezing.). Patient not taking: Reported on 09/01/2018 07/04/18 12/14/18  Robyn Haber, MD    Allergies Gluten meal and Ondansetron  Family History  Problem Relation Age of Onset  . Hypertension Mother   . Stroke Mother   . Diabetes Son 12       type 1  . Hypertension Maternal Grandmother   . Colon cancer Maternal Grandmother   . Breast cancer Maternal Grandmother   . Skin cancer Maternal Grandmother   . Hypertension Maternal Grandfather   . Hypertension Paternal Grandmother   . Diabetes Paternal Grandmother   . Hypertension Paternal Grandfather     Social History Social  History   Tobacco Use  . Smoking status: Never Smoker  . Smokeless tobacco: Never Used  Substance Use Topics  . Alcohol use: No    Alcohol/week: 0.0 standard drinks  . Drug use: No    Review of Systems  Constitutional: No fever/chills Eyes: No visual changes. ENT: No sore throat. Cardiovascular: Denies chest pain. Respiratory: Denies shortness of breath. Gastrointestinal: No abdominal pain.  No nausea, no vomiting.  No diarrhea.  No constipation. Genitourinary: Negative for dysuria. Musculoskeletal: Negative for back pain. Skin: Negative for rash. Neurological: Negative for headaches, focal weakness   ____________________________________________   PHYSICAL EXAM:  VITAL SIGNS: ED Triage Vitals  Enc Vitals Group     BP 02/22/19 1249 (!) 128/94     Pulse --      Resp 02/22/19 1252 16     Temp 02/22/19 1249 98.3 F (36.8 C)     Temp Source 02/22/19 1249 Oral     SpO2 --      Weight 02/22/19 1253 145 lb (65.8 kg)     Height 02/22/19 1253 5\' 6"  (1.676 m)     Head Circumference --      Peak Flow --      Pain Score 02/22/19 1252 0     Pain Loc --      Pain Edu? --      Excl. in [place]? --     Constitutional: Alert and oriented. Well appearing and in no acute distress. Eyes: Conjunctivae are normal. PER. EOMI. Head: Atraumatic. Nose: No congestion/rhinnorhea. Mouth/Throat: Mucous membranes are moist.  Oropharynx non-erythematous. Neck: No stridor.  Cardiovascular: Normal rate, regular rhythm. Grossly normal heart sounds.  Good peripheral circulation. Respiratory: Normal respiratory effort.  No retractions. Lungs CTAB. Gastrointestinal: Soft and nontender. No distention. No abdominal bruits. No CVA tenderness. Musculoskeletal: No lower extremity tenderness nor edema. Neurologic:  Normal speech and language. No gross focal neurologic deficits are appreciated.  Cranial nerves II through XII are intact visual fields laterally are intact cerebellar finger-to-nose rapid  alternating movements are normal motor strength is 5/5 throughout.  Patient only reports numbness around the eyes at the current time the left I should say at the current time. Skin:  Skin is warm, dry and intact. No rash noted. Psychiatric: Mood and affect are normal. Speech and behavior are normal.  ____________________________________________   LABS (all labs ordered are listed, but only abnormal results are displayed)  Labs Reviewed  URINALYSIS, COMPLETE (UACMP) WITH MICROSCOPIC - Abnormal; Notable for the following components:      Result Value   Color, Urine YELLOW (*)    APPearance CLEAR (*)    All other components within normal limits  COMPREHENSIVE METABOLIC PANEL - Abnormal; Notable for the following components:   Glucose, Bld 116 (*)    All other components within normal limits  CBC   ____________________________________________  EKG EKG read interpreted by me shows normal sinus rhythm at 100 normal axis no acute ST-T wave changes R  wave progression is a little off but this is likely due to lead placement as 3 and 1 have less R wave progression in lead II does.  ____________________________________________  RADIOLOGY  ED MD interpretation: MRI read by radiology reviewed by me does not show any acute intracranial abnormality.  Official radiology report(s): Mr Brain Wo Contrast  Result Date: 02/22/2019 CLINICAL DATA:  Focal neuro deficit, greater than 6 hours, stroke suspected. Additional history provided: Intermittent headache/head heaviness. Patient presented 2 days prior for same symptoms along with left facial numbness and slurred speech. EXAM: MRI HEAD WITHOUT CONTRAST TECHNIQUE: Multiplanar, multiecho pulse sequences of the brain and surrounding structures were obtained without intravenous contrast. COMPARISON:  Head CT 02/20/2019 FINDINGS: Brain: Multiple sequences are motion degraded, most notably there is moderate motion degradation of the coronal  diffusion-weighted imaging, mild motion degradation of the sagittal T1 weighted imaging and moderate motion degradation of the coronal T2 weighted imaging. There is no convincing evidence of acute infarct. No evidence of intracranial mass. No midline shift or extra-axial fluid collection. No chronic intracranial blood products. Minimal T2/FLAIR hyperintensity within the right cerebral white matter is nonspecific, but likely reflects chronic small vessel ischemic disease. Cerebral volume is normal for age. Vascular: Flow voids maintained within the proximal large arterial vessels. Non dominant intracranial right vertebral artery. Skull and upper cervical spine: No focal marrow lesion. Sinuses/Orbits: Visualized orbits demonstrate no acute abnormality. No significant paranasal sinus disease. Trace fluid within bilateral mastoid air cells. IMPRESSION: 1. Intermittently motion degraded examination. 2. No evidence of acute intracranial abnormality. 3. Minimal chronic small vessel ischemic disease. Electronically Signed   By: Kellie Simmering DO   On: 02/22/2019 14:52    ____________________________________________   PROCEDURES  Procedure(s) performed (including Critical Care):  Procedures   ____________________________________________   INITIAL IMPRESSION / ASSESSMENT AND PLAN / ED COURSE Discussed in detail with Dr. Irish Elders including results of CT and MRI.  He feels it is unlikely for her to have stuttering strokes occluding the same vessel each time.  He thinks likely it is her anxiety depression he would have her follow-up outpatient with neurology to get an EEG to make sure it is not seizures.           Clinical Course as of Feb 21 1534  Thu Feb 22, 2019  1355 ED EKG [JE]    Clinical Course User Index [JE] Maryruth Hancock, Student-PA     ____________________________________________   FINAL CLINICAL IMPRESSION(S) / ED DIAGNOSES  Final diagnoses:  Hypertension, unspecified type      ED Discharge Orders    None       Note:  This document was prepared using Dragon voice recognition software and may include unintentional dictation errors.    Nena Polio, MD 02/22/19 1536

## 2019-02-22 NOTE — ED Notes (Signed)
Patient transported to MRI 

## 2019-02-23 ENCOUNTER — Other Ambulatory Visit: Payer: Self-pay

## 2019-02-23 ENCOUNTER — Encounter: Payer: Self-pay | Admitting: Emergency Medicine

## 2019-02-23 ENCOUNTER — Emergency Department: Payer: PRIVATE HEALTH INSURANCE

## 2019-02-23 ENCOUNTER — Observation Stay
Admission: EM | Admit: 2019-02-23 | Discharge: 2019-02-24 | Disposition: A | Discharge status: Home / Self Care | Payer: PRIVATE HEALTH INSURANCE | Attending: Internal Medicine | Admitting: Internal Medicine

## 2019-02-23 ENCOUNTER — Observation Stay: Payer: PRIVATE HEALTH INSURANCE

## 2019-02-23 ENCOUNTER — Telehealth: Payer: Self-pay

## 2019-02-23 DIAGNOSIS — G43911 Migraine, unspecified, intractable, with status migrainosus: Secondary | ICD-10-CM | POA: Diagnosis present

## 2019-02-23 DIAGNOSIS — Z79899 Other long term (current) drug therapy: Secondary | ICD-10-CM | POA: Insufficient documentation

## 2019-02-23 DIAGNOSIS — R200 Anesthesia of skin: Secondary | ICD-10-CM | POA: Diagnosis present

## 2019-02-23 DIAGNOSIS — Z20828 Contact with and (suspected) exposure to other viral communicable diseases: Secondary | ICD-10-CM | POA: Diagnosis not present

## 2019-02-23 DIAGNOSIS — I10 Essential (primary) hypertension: Secondary | ICD-10-CM | POA: Diagnosis present

## 2019-02-23 DIAGNOSIS — K900 Celiac disease: Secondary | ICD-10-CM | POA: Diagnosis not present

## 2019-02-23 DIAGNOSIS — G43109 Migraine with aura, not intractable, without status migrainosus: Principal | ICD-10-CM | POA: Insufficient documentation

## 2019-02-23 DIAGNOSIS — F329 Major depressive disorder, single episode, unspecified: Secondary | ICD-10-CM | POA: Insufficient documentation

## 2019-02-23 DIAGNOSIS — R440 Auditory hallucinations: Secondary | ICD-10-CM | POA: Diagnosis not present

## 2019-02-23 DIAGNOSIS — F4323 Adjustment disorder with mixed anxiety and depressed mood: Secondary | ICD-10-CM | POA: Diagnosis present

## 2019-02-23 DIAGNOSIS — F39 Unspecified mood [affective] disorder: Secondary | ICD-10-CM | POA: Diagnosis not present

## 2019-02-23 DIAGNOSIS — B9620 Unspecified Escherichia coli [E. coli] as the cause of diseases classified elsewhere: Secondary | ICD-10-CM | POA: Insufficient documentation

## 2019-02-23 DIAGNOSIS — N390 Urinary tract infection, site not specified: Secondary | ICD-10-CM | POA: Insufficient documentation

## 2019-02-23 DIAGNOSIS — Z8249 Family history of ischemic heart disease and other diseases of the circulatory system: Secondary | ICD-10-CM | POA: Insufficient documentation

## 2019-02-23 DIAGNOSIS — G459 Transient cerebral ischemic attack, unspecified: Secondary | ICD-10-CM

## 2019-02-23 DIAGNOSIS — K219 Gastro-esophageal reflux disease without esophagitis: Secondary | ICD-10-CM | POA: Diagnosis not present

## 2019-02-23 LAB — COMPREHENSIVE METABOLIC PANEL
ALT: 23 U/L (ref 0–44)
AST: 22 U/L (ref 15–41)
Albumin: 3.7 g/dL (ref 3.5–5.0)
Alkaline Phosphatase: 100 U/L (ref 38–126)
Anion gap: 9 (ref 5–15)
BUN: 13 mg/dL (ref 6–20)
CO2: 24 mmol/L (ref 22–32)
Calcium: 8.9 mg/dL (ref 8.9–10.3)
Chloride: 106 mmol/L (ref 98–111)
Creatinine, Ser: 0.82 mg/dL (ref 0.44–1.00)
GFR calc Af Amer: >60 mL/min (ref >60)
GFR calc non Af Amer: >60 mL/min (ref >60)
Glucose, Bld: 131 mg/dL — ABNORMAL HIGH (ref 70–99)
Potassium: 3.7 mmol/L (ref 3.5–5.1)
Sodium: 139 mmol/L (ref 135–145)
Total Bilirubin: 0.6 mg/dL (ref 0.3–1.2)
Total Protein: 7 g/dL (ref 6.5–8.1)

## 2019-02-23 LAB — DIFFERENTIAL
Abs Immature Granulocytes: 0.01 10*3/uL (ref 0.00–0.07)
Basophils Absolute: 0.1 10*3/uL (ref 0.0–0.1)
Basophils Relative: 1 %
Eosinophils Absolute: 0.3 10*3/uL (ref 0.0–0.5)
Eosinophils Relative: 8 %
Immature Granulocytes: 0 %
Lymphocytes Relative: 23 %
Lymphs Abs: 0.9 10*3/uL (ref 0.7–4.0)
Monocytes Absolute: 0.4 10*3/uL (ref 0.1–1.0)
Monocytes Relative: 9 %
Neutro Abs: 2.4 10*3/uL (ref 1.7–7.7)
Neutrophils Relative %: 59 %

## 2019-02-23 LAB — CBC
HCT: 44.3 % (ref 36.0–46.0)
Hemoglobin: 15.3 g/dL — ABNORMAL HIGH (ref 12.0–15.0)
MCH: 32.1 pg (ref 26.0–34.0)
MCHC: 34.5 g/dL (ref 30.0–36.0)
MCV: 93.1 fL (ref 80.0–100.0)
Platelets: 258 10*3/uL (ref 150–400)
RBC: 4.76 MIL/uL (ref 3.87–5.11)
RDW: 12.8 % (ref 11.5–15.5)
WBC: 4.1 10*3/uL (ref 4.0–10.5)
nRBC: 0 % (ref 0.0–0.2)

## 2019-02-23 LAB — PROTIME-INR
INR: 0.9 (ref 0.8–1.2)
Prothrombin Time: 12.3 seconds (ref 11.4–15.2)

## 2019-02-23 LAB — GLUCOSE, CAPILLARY: Glucose-Capillary: 116 mg/dL — ABNORMAL HIGH (ref 70–99)

## 2019-02-23 LAB — APTT: aPTT: 26 seconds (ref 24–36)

## 2019-02-23 MED ORDER — DIPHENHYDRAMINE HCL 50 MG/ML IJ SOLN
25.0000 mg | Freq: Once | INTRAMUSCULAR | Status: AC
  Administered 2019-02-23: 25 mg via INTRAVENOUS
  Filled 2019-02-23: qty 1

## 2019-02-23 MED ORDER — IOHEXOL 350 MG/ML SOLN
75.0000 mL | Freq: Once | INTRAVENOUS | Status: AC | PRN
  Administered 2019-02-24: 75 mL via INTRAVENOUS

## 2019-02-23 MED ORDER — BUPROPION HCL ER (XL) 150 MG PO TB24: 150.0000 mg | ORAL_TABLET | ORAL | Status: DC

## 2019-02-23 MED ORDER — OLANZAPINE 5 MG PO TABS
5.0000 mg | ORAL_TABLET | Freq: Every day | ORAL | Status: DC
  Administered 2019-02-23: 5 mg via ORAL
  Filled 2019-02-23: qty 1

## 2019-02-23 MED ORDER — PROCHLORPERAZINE EDISYLATE 10 MG/2ML IJ SOLN
10.0000 mg | Freq: Once | INTRAMUSCULAR | Status: AC
  Administered 2019-02-23: 10 mg via INTRAVENOUS
  Filled 2019-02-23: qty 2

## 2019-02-23 MED ORDER — ASPIRIN 300 MG RE SUPP: 300.0000 mg | Freq: Every day | RECTAL | Status: DC

## 2019-02-23 MED ORDER — STROKE: EARLY STAGES OF RECOVERY BOOK: Freq: Once | Status: DC

## 2019-02-23 MED ORDER — ZONISAMIDE 25 MG PO CAPS
25.0000 mg | ORAL_CAPSULE | Freq: Four times a day (QID) | ORAL | Status: DC
  Filled 2019-02-23 (×4): qty 1

## 2019-02-23 MED ORDER — ACETAMINOPHEN 160 MG/5ML PO SOLN
650.0000 mg | ORAL | Status: DC | PRN
  Filled 2019-02-23: qty 20.3

## 2019-02-23 MED ORDER — NITROFURANTOIN MONOHYD MACRO 100 MG PO CAPS
100.0000 mg | ORAL_CAPSULE | Freq: Two times a day (BID) | ORAL | Status: DC
  Administered 2019-02-23: 100 mg via ORAL
  Filled 2019-02-23: qty 1

## 2019-02-23 MED ORDER — ENOXAPARIN SODIUM 40 MG/0.4ML ~~LOC~~ SOLN
40.0000 mg | SUBCUTANEOUS | Status: DC
  Filled 2019-02-23: qty 0.4

## 2019-02-23 MED ORDER — SODIUM CHLORIDE 0.9 % IV BOLUS
1000.0000 mL | Freq: Once | INTRAVENOUS | Status: AC
  Administered 2019-02-23: 1000 mL via INTRAVENOUS

## 2019-02-23 MED ORDER — ACETAMINOPHEN 325 MG PO TABS: 650.0000 mg | ORAL_TABLET | ORAL | Status: DC | PRN

## 2019-02-23 MED ORDER — ASPIRIN EC 325 MG PO TBEC: 325.0000 mg | DELAYED_RELEASE_TABLET | Freq: Every day | ORAL | Status: DC

## 2019-02-23 MED ORDER — ACETAMINOPHEN 650 MG RE SUPP: 650.0000 mg | RECTAL | Status: DC | PRN

## 2019-02-23 MED ORDER — SENNOSIDES-DOCUSATE SODIUM 8.6-50 MG PO TABS: 1.0000 | ORAL_TABLET | Freq: Every evening | ORAL | Status: DC | PRN

## 2019-02-23 NOTE — ED Notes (Signed)
Pt ambulatory to the bathroom without assistance.  

## 2019-02-23 NOTE — ED Notes (Signed)
Pt reports having a fall on Sunday when she became suddenly dizzy and off balance. She was oriented and able to recover, but felt weak afterwards

## 2019-02-23 NOTE — ED Notes (Signed)
hospitalist in to see pt.

## 2019-02-23 NOTE — ED Notes (Signed)
Pt returned from ct

## 2019-02-23 NOTE — ED Notes (Signed)
Pt c/o of L sided facial numbness around her eye and near her cheek. Pt says she can feel it traveling to her other eye. This began around 1100 this am. Pt states she was seen yesterday for the same.

## 2019-02-23 NOTE — ED Notes (Signed)
Pt denies needs, informed of status and will move to room for cardiac monitoring when available. Family at bedside

## 2019-02-23 NOTE — H&P (Signed)
History and Physical    Taralynn Quiett XTK:[id number] DOB: [birth year] DOA: 02/23/2019  PCP: [name], NP  Patient coming from: Home  I have personally briefly reviewed patient's old medical records in [place]  Chief Complaint: Left facial numbness, speech deficit, headache, blurry vision  HPI: Samari Bittinger is a 53 y.o. female with medical history significant for hypertension, mood disorder/depression/anxiety on Zyprexa and Wellbutrin, and chronic migraines on zonisamide who presents to the ED for evaluation of worsening left facial numbness, speech deficits, headache, blurry vision, and generalized weakness.  Patient reports 5 to 6 days of intermittent left-sided facial numbness, bilateral blurry vision, right temporal headache, slurred speech, occasional aphasia, left-sided weakness, and fatigue.  She also reports some auditory hallucinations.    She was seen at the [facility] ED on 02/20/2019 at which time she had a CT head performed without acute intracranial abnormalities.  She left to go home prior to be seen by the ED physician.  She was seen at the [facility] ED on 02/22/2019 and was noted to have some word finding difficulty.  MRI brain was performed however was limited due to motion degradation, no obvious evidence of acute intracranial abnormality was noted.  EDP discussed with on-call neurologist who felt symptoms were possibly related to her underlying mood disorder and recommended she follow-up outpatient for EEG.  Patient states she has been having continued symptoms which are now worsening therefore represented to the ED.  She says she has been recently tapering down her Wellbutrin from 150 mg daily to once every other day.  She says she started the taper after onset of her symptoms.  She was also recently diagnosed with E. coli UTI and started on a 5-day course of nitrofurantoin of which she has 2 doses left.  She reports a history of chronic migraines for which she  takes zonisamide 4 times daily, she has been on this for several months.  She is not sure if her current headache is similar to her prior migraines.  She also states she takes olanzapine 5 mg every night for her mood disorder.  She was recently started on lisinopril 20 mg daily for hypertension.  She does report occasional lightheadedness when she stands up from a sitting or lying position.  She denies any feeling of room spinning sensation.  Her only other medications are Prilosec and hydroxyzine.  ED Course:  Initial vitals showed BP 139/100, pulse 101, RR 18, temp 97.7 Fahrenheit, SPO2 98% on room air.  Labs are notable for WBC 4.1, hemoglobin 15.3, platelets 258,000, sodium 139, potassium 3.7, bicarb 24, BUN 13, creatinine 0.82, LFTs within normal limits, serum glucose 131.  SARS-CoV-2 test was obtained and pending.  CT head without contrast was negative for acute intracranial abnormality, hemorrhage, mass lesion, or acute infarction.  Per EDP, on arrival patient did have subtle transient left facial droop and slurred speech on arrival.  Patient was given IV Compazine and IV Benadryl.  Due to persistent symptoms and limited prior studies, the hospitalist service was consulted to admit for further evaluation and management and TIA/CVA rule out.  Review of Systems: All systems reviewed and are negative except as documented in history of present illness above.   Past Medical History:  Diagnosis Date   Anemia    BV (bacterial vaginosis) 11/27/2012   Celiac disease    Depression    Essential hypertension    Family history of adverse reaction to anesthesia    MOM-HARD TIME WAKING UP  GERD (gastroesophageal reflux disease)    Migraine with visual aura    MIGRAINES   UTI (lower urinary tract infection)     Past Surgical History:  Procedure Laterality Date   BLADDER SUSPENSION     EXPLORATORY LAPAROTOMY     IUD REMOVAL  07/25/2017   Procedure: INTRAUTERINE DEVICE (IUD)  REMOVAL;  Surgeon: Harlin Heys, MD;  Location: [facility] ORS;  Service: Gynecology;;   LAPAROSCOPIC ASSISTED VAGINAL HYSTERECTOMY Bilateral 07/25/2017   Procedure: LAPAROSCOPIC ASSISTED VAGINAL HYSTERECTOMY WITH BILATERAL [place] OOPHERECTOMY;  Surgeon: Harlin Heys, MD;  Location: [facility] ORS;  Service: Gynecology;  Laterality: Bilateral;   TUBAL LIGATION      Social History:  reports that she has never smoked. She has never used smokeless tobacco. She reports that she does not drink alcohol or use drugs.  Allergies  Allergen Reactions   Gluten Meal Diarrhea and Nausea Only        Ondansetron Other (See Comments)    Helps her nausea, but makes HA worse    Family History  Problem Relation Age of Onset   Hypertension Mother    Stroke Mother    Diabetes Son 12       type 1   Hypertension Maternal Grandmother    Colon cancer Maternal Grandmother    Breast cancer Maternal Grandmother    Skin cancer Maternal Grandmother    Hypertension Maternal Grandfather    Hypertension Paternal Grandmother    Diabetes Paternal Grandmother    Hypertension Paternal Grandfather      Prior to Admission medications   Medication Sig Start Date End Date Taking? Authorizing Provider  buPROPion (WELLBUTRIN XL) 150 MG 24 hr tablet Take 150 mg by mouth daily.   Yes [provider]  hydrOXYzine (ATARAX/VISTARIL) 25 MG tablet Take 1 tablet (25 mg total) by mouth every 6 (six) hours. Patient taking differently: Take 25 mg by mouth 2 (two) times daily.  01/23/19  Yes Bast, Traci A, NP  lisinopril (ZESTRIL) 20 MG tablet Take 1 tablet (20 mg total) by mouth daily. For blood pressure 02/16/19  Yes [name], NP  nitrofurantoin, macrocrystal-monohydrate, (MACROBID) 100 MG capsule Take 1 capsule (100 mg total) by mouth 2 (two) times daily. 02/18/19  Yes [name], NP  omeprazole (PRILOSEC) 20 MG capsule TAKE 1 CAPSULE BY MOUTH EVERY DAY 11/10/18  Yes Delia Chimes A, MD   zonisamide (ZONEGRAN) 25 MG capsule Take 25 mg by mouth 4 (four) times daily.  01/13/19  Yes [provider]  albuterol (PROVENTIL HFA;VENTOLIN HFA) 108 (90 Base) MCG/ACT inhaler Inhale 2 puffs into the lungs every 4 (four) hours as needed for wheezing or shortness of breath (cough, shortness of breath or wheezing.). Patient not taking: Reported on 09/01/2018 07/04/18 12/14/18  Robyn Haber, MD    Physical Exam: Vitals:   02/23/19 2130 02/23/19 2200 02/23/19 2230 02/23/19 2300  BP: 137/87 116/70 121/80 119/77  Pulse:  (!) 101 (!) 102 93  Resp: 14 18 20 17   Temp:      TempSrc:      SpO2:  97% 95% 96%  Weight:      Height:        Constitutional: Resting supine in bed, NAD, calm, appears tired but comfortable Eyes: PERRL, EOMI, lids and conjunctivae normal ENMT: Mucous membranes are moist. Posterior pharynx clear of any exudate or lesions. Neck: normal, supple, no masses. Respiratory: clear to auscultation bilaterally, no wheezing, no crackles. Normal respiratory effort. No accessory muscle use.  Cardiovascular: Regular rate and rhythm, no murmurs / rubs / gallops. No extremity edema. 2+ pedal pulses. Abdomen: no tenderness, no masses palpated. No hepatosplenomegaly. Bowel sounds positive.  Musculoskeletal: no clubbing / cyanosis. No joint deformity upper and lower extremities. Good ROM, no contractures. Normal muscle tone.  Skin: no rashes, lesions, ulcers. No induration Neurologic: CN 2-12 grossly intact. Sensation diminished left cheek and left upper extremity otherwise intact, DTR normal. Strength 5/5 in all 4.  No dysmetria present. Psychiatric: Normal judgment and insight.  Somnolent but easily awakens, alert and oriented x 3. Normal mood.   Labs on Admission: I have personally reviewed following labs and imaging studies  CBC: Recent Labs  Lab 02/20/19 1613 02/20/19 1639 02/22/19 1258 02/23/19 1307  WBC 4.9  --  5.3 4.1  NEUTROABS 3.0  --   --  2.4  HGB 14.5 14.6  15.0 15.3*  HCT 43.5 43.0 43.0 44.3  MCV 98.2  --  93.3 93.1  PLT 261  --  248 161   Basic Metabolic Panel: Recent Labs  Lab 02/20/19 1613 02/20/19 1639 02/22/19 1258 02/23/19 1307  NA 138 142 141 139  K 3.6 3.7 3.6 3.7  CL 106 107 108 106  CO2 23  --  23 24  GLUCOSE 106* 102* 116* 131*  BUN 13 14 14 13   CREATININE 0.83 0.70 0.72 0.82  CALCIUM 8.9  --  9.0 8.9   GFR: Estimated Creatinine Clearance: 75.1 mL/min (by C-G formula based on SCr of 0.82 mg/dL). Liver Function Tests: Recent Labs  Lab 02/20/19 1613 02/22/19 1258 02/23/19 1307  AST 19 23 22   ALT 18 22 23   ALKPHOS 92 94 100  BILITOT 0.3 0.7 0.6  PROT 6.1* 6.7 7.0  ALBUMIN 3.5 3.8 3.7   No results for input(s): LIPASE, AMYLASE in the last 168 hours. No results for input(s): AMMONIA in the last 168 hours. Coagulation Profile: Recent Labs  Lab 02/20/19 1613 02/23/19 1307  INR 0.9 0.9   Cardiac Enzymes: No results for input(s): CKTOTAL, CKMB, CKMBINDEX, TROPONINI in the last 168 hours. BNP (last 3 results) No results for input(s): PROBNP in the last 8760 hours. HbA1C: No results for input(s): HGBA1C in the last 72 hours. CBG: Recent Labs  Lab 02/23/19 1305  GLUCAP 116*   Lipid Profile: No results for input(s): CHOL, HDL, LDLCALC, TRIG, CHOLHDL, LDLDIRECT in the last 72 hours. Thyroid Function Tests: No results for input(s): TSH, T4TOTAL, FREET4, T3FREE, THYROIDAB in the last 72 hours. Anemia Panel: No results for input(s): VITAMINB12, FOLATE, FERRITIN, TIBC, IRON, RETICCTPCT in the last 72 hours. Urine analysis:    Component Value Date/Time   COLORURINE YELLOW (A) 02/22/2019 1300   APPEARANCEUR CLEAR (A) 02/22/2019 1300   LABSPEC 1.014 02/22/2019 1300   PHURINE 7.0 02/22/2019 1300   GLUCOSEU NEGATIVE 02/22/2019 1300   HGBUR NEGATIVE 02/22/2019 1300   BILIRUBINUR NEGATIVE 02/22/2019 1300   BILIRUBINUR Negative 02/16/2019 1122   KETONESUR NEGATIVE 02/22/2019 1300   PROTEINUR NEGATIVE  02/22/2019 1300   UROBILINOGEN 0.2 02/16/2019 1122   UROBILINOGEN 0.2 12/14/2018 1630   NITRITE NEGATIVE 02/22/2019 1300   LEUKOCYTESUR NEGATIVE 02/22/2019 1300    Radiological Exams on Admission: Ct Angio Head W Or Wo Contrast  Result Date: 02/24/2019 CLINICAL DATA:  Headache with slurred speech and encephalopathy. EXAM: CT ANGIOGRAPHY HEAD AND NECK TECHNIQUE: Multidetector CT imaging of the head and neck was performed using the standard protocol during bolus administration of intravenous contrast. Multiplanar CT image reconstructions and MIPs  were obtained to evaluate the vascular anatomy. Carotid stenosis measurements (when applicable) are obtained utilizing NASCET criteria, using the distal internal carotid diameter as the denominator. CONTRAST:  47m OMNIPAQUE IOHEXOL 350 MG/ML SOLN COMPARISON:  Head CT 02/23/2019 and brain MRI 02/22/2019 FINDINGS: CT HEAD FINDINGS Brain: There is no mass, hemorrhage or extra-axial collection. The size and configuration of the ventricles and extra-axial CSF spaces are normal. There is no acute or chronic infarction. The brain parenchyma is normal. Skull: The visualized skull base, calvarium and extracranial soft tissues are normal. Sinuses/Orbits: No fluid levels or advanced mucosal thickening of the visualized paranasal sinuses. No mastoid or middle ear effusion. The orbits are normal. CTA NECK FINDINGS SKELETON: There is no bony spinal canal stenosis. No lytic or blastic lesion. OTHER NECK: Normal pharynx, larynx and major salivary glands. No cervical lymphadenopathy. Unremarkable thyroid gland. UPPER CHEST: No pneumothorax or pleural effusion. No nodules or masses. AORTIC ARCH: There is no calcific atherosclerosis of the aortic arch. There is no aneurysm, dissection or hemodynamically significant stenosis of the visualized portion of the aorta. Conventional 3 vessel aortic branching pattern. The visualized proximal subclavian arteries are widely patent. RIGHT  CAROTID SYSTEM: Normal without aneurysm, dissection or stenosis. LEFT CAROTID SYSTEM: Normal without aneurysm, dissection or stenosis. VERTEBRAL ARTERIES: Left dominant configuration. Both origins are clearly patent. There is no dissection, occlusion or flow-limiting stenosis to the skull base (V1-V3 segments). CTA HEAD FINDINGS POSTERIOR CIRCULATION: --Vertebral arteries: Normal V4 segments. --Posterior inferior cerebellar arteries (PICA): Patent origins from the vertebral arteries. --Anterior inferior cerebellar arteries (AICA): Patent origins from the basilar artery. --Basilar artery: Normal. --Superior cerebellar arteries: Normal. --Posterior cerebral arteries: Normal. Both originate from the basilar artery. Posterior communicating arteries (p-comm) are diminutive or absent. ANTERIOR CIRCULATION: --Intracranial internal carotid arteries: Normal. --Anterior cerebral arteries (ACA): Normal. Both A1 segments are present. Patent anterior communicating artery (a-comm). --Middle cerebral arteries (MCA): Normal. VENOUS SINUSES: As permitted by contrast timing, patent. ANATOMIC VARIANTS: None Review of the MIP images confirms the above findings. IMPRESSION: Normal CTA of the head and neck. Electronically Signed   By: KUlyses JarredM.D.   On: 02/24/2019 00:35   Ct Head Wo Contrast  Result Date: 02/23/2019 CLINICAL DATA:  Headache EXAM: CT HEAD WITHOUT CONTRAST TECHNIQUE: Contiguous axial images were obtained from the base of the skull through the vertex without intravenous contrast. COMPARISON:  02/20/2019 FINDINGS: Brain: No acute intracranial abnormality. Specifically, no hemorrhage, hydrocephalus, mass lesion, acute infarction, or significant intracranial injury. Vascular: No hyperdense vessel or unexpected calcification. Skull: No acute calvarial abnormality. Sinuses/Orbits: Visualized paranasal sinuses and mastoids clear. Orbital soft tissues unremarkable. Other: None IMPRESSION: Normal study. Electronically  Signed   By: KRolm BaptiseM.D.   On: 02/23/2019 22:03   Ct Angio Neck W Or Wo Contrast  Result Date: 02/24/2019 CLINICAL DATA:  Headache with slurred speech and encephalopathy. EXAM: CT ANGIOGRAPHY HEAD AND NECK TECHNIQUE: Multidetector CT imaging of the head and neck was performed using the standard protocol during bolus administration of intravenous contrast. Multiplanar CT image reconstructions and MIPs were obtained to evaluate the vascular anatomy. Carotid stenosis measurements (when applicable) are obtained utilizing NASCET criteria, using the distal internal carotid diameter as the denominator. CONTRAST:  783mOMNIPAQUE IOHEXOL 350 MG/ML SOLN COMPARISON:  Head CT 02/23/2019 and brain MRI 02/22/2019 FINDINGS: CT HEAD FINDINGS Brain: There is no mass, hemorrhage or extra-axial collection. The size and configuration of the ventricles and extra-axial CSF spaces are normal. There is no acute or chronic infarction.  The brain parenchyma is normal. Skull: The visualized skull base, calvarium and extracranial soft tissues are normal. Sinuses/Orbits: No fluid levels or advanced mucosal thickening of the visualized paranasal sinuses. No mastoid or middle ear effusion. The orbits are normal. CTA NECK FINDINGS SKELETON: There is no bony spinal canal stenosis. No lytic or blastic lesion. OTHER NECK: Normal pharynx, larynx and major salivary glands. No cervical lymphadenopathy. Unremarkable thyroid gland. UPPER CHEST: No pneumothorax or pleural effusion. No nodules or masses. AORTIC ARCH: There is no calcific atherosclerosis of the aortic arch. There is no aneurysm, dissection or hemodynamically significant stenosis of the visualized portion of the aorta. Conventional 3 vessel aortic branching pattern. The visualized proximal subclavian arteries are widely patent. RIGHT CAROTID SYSTEM: Normal without aneurysm, dissection or stenosis. LEFT CAROTID SYSTEM: Normal without aneurysm, dissection or stenosis. VERTEBRAL  ARTERIES: Left dominant configuration. Both origins are clearly patent. There is no dissection, occlusion or flow-limiting stenosis to the skull base (V1-V3 segments). CTA HEAD FINDINGS POSTERIOR CIRCULATION: --Vertebral arteries: Normal V4 segments. --Posterior inferior cerebellar arteries (PICA): Patent origins from the vertebral arteries. --Anterior inferior cerebellar arteries (AICA): Patent origins from the basilar artery. --Basilar artery: Normal. --Superior cerebellar arteries: Normal. --Posterior cerebral arteries: Normal. Both originate from the basilar artery. Posterior communicating arteries (p-comm) are diminutive or absent. ANTERIOR CIRCULATION: --Intracranial internal carotid arteries: Normal. --Anterior cerebral arteries (ACA): Normal. Both A1 segments are present. Patent anterior communicating artery (a-comm). --Middle cerebral arteries (MCA): Normal. VENOUS SINUSES: As permitted by contrast timing, patent. ANATOMIC VARIANTS: None Review of the MIP images confirms the above findings. IMPRESSION: Normal CTA of the head and neck. Electronically Signed   By: Ulyses Jarred M.D.   On: 02/24/2019 00:35   Mr Brain Wo Contrast  Result Date: 02/24/2019 CLINICAL DATA:  Headache EXAM: MRI HEAD WITHOUT CONTRAST TECHNIQUE: Multiplanar, multiecho pulse sequences of the brain and surrounding structures were obtained without intravenous contrast. COMPARISON:  Brain MRI 02/22/2019 FINDINGS: BRAIN: There is no acute infarct, acute hemorrhage or extra-axial collection. The white matter signal is normal for the patient's age. The cerebral and cerebellar volume are age-appropriate. There is no hydrocephalus. The midline structures are normal. VASCULAR: The major intracranial arterial and venous sinus flow voids are normal. Susceptibility-sensitive sequences show no chronic microhemorrhage or superficial siderosis. SKULL AND UPPER CERVICAL SPINE: Calvarial bone marrow signal is normal. There is no skull base mass.  The visualized upper cervical spine and soft tissues are normal. SINUSES/ORBITS: There are no fluid levels or advanced mucosal thickening. The mastoid air cells and middle ear cavities are free of fluid. The orbits are normal. IMPRESSION: Normal MRI of the brain. Electronically Signed   By: Ulyses Jarred M.D.   On: 02/24/2019 01:03   Mr Brain Wo Contrast  Result Date: 02/22/2019 CLINICAL DATA:  Focal neuro deficit, greater than 6 hours, stroke suspected. Additional history provided: Intermittent headache/head heaviness. Patient presented 2 days prior for same symptoms along with left facial numbness and slurred speech. EXAM: MRI HEAD WITHOUT CONTRAST TECHNIQUE: Multiplanar, multiecho pulse sequences of the brain and surrounding structures were obtained without intravenous contrast. COMPARISON:  Head CT 02/20/2019 FINDINGS: Brain: Multiple sequences are motion degraded, most notably there is moderate motion degradation of the coronal diffusion-weighted imaging, mild motion degradation of the sagittal T1 weighted imaging and moderate motion degradation of the coronal T2 weighted imaging. There is no convincing evidence of acute infarct. No evidence of intracranial mass. No midline shift or extra-axial fluid collection. No chronic intracranial blood products. Minimal T2/FLAIR  hyperintensity within the right cerebral white matter is nonspecific, but likely reflects chronic small vessel ischemic disease. Cerebral volume is normal for age. Vascular: Flow voids maintained within the proximal large arterial vessels. Non dominant intracranial right vertebral artery. Skull and upper cervical spine: No focal marrow lesion. Sinuses/Orbits: Visualized orbits demonstrate no acute abnormality. No significant paranasal sinus disease. Trace fluid within bilateral mastoid air cells. IMPRESSION: 1. Intermittently motion degraded examination. 2. No evidence of acute intracranial abnormality. 3. Minimal chronic small vessel  ischemic disease. Electronically Signed   By: Kellie Simmering DO   On: 02/22/2019 14:52    EKG: Independently reviewed. Sinus tachycardia, no acute ischemic changes.  Rate slightly faster when compared to prior.  Assessment/Plan Principal Problem:   Left facial numbness Active Problems:   Migraine with visual aura   Adjustment disorder with mixed anxiety and depressed mood   Essential hypertension  Richetta Cubillos is a 53 y.o. female with medical history significant for hypertension, mood disorder/depression/anxiety on Zyprexa and Wellbutrin, and chronic migraines on zonisamide who is admitted for evaluation of intermittent left-sided sensory changes, speech difficulty, and blurry vision.  Left facial numbness/speech changes/blurry vision/headache: Patient with constellation of symptoms without clear etiology.  Has had CT head and motion degraded MRI brain in the last few days.  Will reattempt MRI brain to assess for acute infarction and obtain CTA head and neck to assess for any large vessel disease.  Differential also includes complex migraine, possible seizure disorder, medication effect, Bell's palsy, or psychogenic etiology. -Check MRI brain, CTA head and neck -Obtain EEG -S/p IV Compazine and Benadryl -patient somnolent afterwards but thinks she may have had some improvement in symptoms -Check orthostatic vital signs given report of orthostasis with positional change and recent initiation of lisinopril -Monitor telemetry, continue neurochecks, PT/OT eval  Hypertension: Will hold home lisinopril for now, check orthostatics.  Chronic migraines: Continue home zonisamide for now.  May need adjustment in her medications if work-up is negative for CVA or seizure activity.  Mood disorder/anxiety/depression: As above may be contributing to her presenting symptoms.  Will continue home Zyprexa nightly and Wellbutrin every other day.  Question whether tapering of her Wellbutrin is unmasking the  symptoms however she states they began prior to this change.  DVT prophylaxis: Lovenox  Code Status: Full code, confirmed with patient Family Communication: Discussed with patient's mother at bedside Disposition Plan: Pending clinical progress Consults called: Teleneurology Admission status: Observation   Zada Finders MD [facility]  If 7PM-7AM, please contact night-coverage [email or web address]  02/24/2019, 1:45 AM

## 2019-02-23 NOTE — ED Notes (Signed)
This RN spoke with MD regarding blood work, per MD okay for bloodwork at this time.

## 2019-02-23 NOTE — ED Provider Notes (Signed)
[facility] Emergency Department Provider Note  ____________________________________________   First MD Initiated Contact with Patient 02/23/19 1806     (approximate)  I have reviewed the triage vital signs and the nursing notes.   HISTORY  Chief Complaint Altered Mental Status    HPI [name] is a 53 y.o. female with past medical history as below here with intermittent headache, confusion, slurred speech, and left-sided weakness.  The patient states intermittently for the last several days, she has episodes in which she feels like her vision is diminished on left eye, followed by intermittent weakness in the left arm and leg.  This is worse on the left, but has been generalized as well.  She was seen in the ER yesterday for hypertension, and noted to have the symptoms.  She had a negative MRI at that time, though limited by motion degradation.  Her symptoms then resolved.  Possible TIA evaluation was recommended, but based on discussion with neurology at that time and the resolution of symptoms, she was sent home with outpatient neurology follow-up.  She states she returned today because she noticed some drooping of her left face, left arms and legs, followed by general weakness.  She had some headache as well.  The symptoms are largely resolved at this time.  No headache currently.  No other complaints.        Past Medical History:  Diagnosis Date   Anemia    BV (bacterial vaginosis) 11/27/2012   Celiac disease    Depression    Essential hypertension    Family history of adverse reaction to anesthesia    MOM-HARD TIME WAKING UP   GERD (gastroesophageal reflux disease)    Migraine with visual aura    MIGRAINES   UTI (lower urinary tract infection)     Patient Active Problem List   Diagnosis Date Noted   Left facial numbness 02/23/2019   Urinary incontinence 02/18/2019   Essential hypertension 02/18/2019   Recurrent cystitis  11/27/2012   Migraine with visual aura 09/19/2011   Adjustment disorder with mixed anxiety and depressed mood 09/19/2011    Past Surgical History:  Procedure Laterality Date   BLADDER SUSPENSION     EXPLORATORY LAPAROTOMY     IUD REMOVAL  07/25/2017   Procedure: INTRAUTERINE DEVICE (IUD) REMOVAL;  Surgeon: Harlin Heys, MD;  Location: [facility] ORS;  Service: Gynecology;;   LAPAROSCOPIC ASSISTED VAGINAL HYSTERECTOMY Bilateral 07/25/2017   Procedure: LAPAROSCOPIC ASSISTED VAGINAL HYSTERECTOMY WITH BILATERAL [place];  Surgeon: Harlin Heys, MD;  Location: [facility] ORS;  Service: Gynecology;  Laterality: Bilateral;   TUBAL LIGATION      Prior to Admission medications   Medication Sig Start Date End Date Taking? Authorizing Provider  buPROPion (WELLBUTRIN XL) 150 MG 24 hr tablet Take 150 mg by mouth daily.   Yes [provider]  hydrOXYzine (ATARAX/VISTARIL) 25 MG tablet Take 1 tablet (25 mg total) by mouth every 6 (six) hours. Patient taking differently: Take 25 mg by mouth 2 (two) times daily.  01/23/19  Yes Bast, Traci A, NP  lisinopril (ZESTRIL) 20 MG tablet Take 1 tablet (20 mg total) by mouth daily. For blood pressure 02/16/19  Yes [name], NP  nitrofurantoin, macrocrystal-monohydrate, (MACROBID) 100 MG capsule Take 1 capsule (100 mg total) by mouth 2 (two) times daily. 02/18/19  Yes [name], NP  omeprazole (PRILOSEC) 20 MG capsule TAKE 1 CAPSULE BY MOUTH EVERY DAY 11/10/18  Yes Forrest Moron, MD  zonisamide (ZONEGRAN) 25 MG capsule Take 25 mg by mouth 4 (four) times daily.  01/13/19  Yes [provider]  albuterol (PROVENTIL HFA;VENTOLIN HFA) 108 (90 Base) MCG/ACT inhaler Inhale 2 puffs into the lungs every 4 (four) hours as needed for wheezing or shortness of breath (cough, shortness of breath or wheezing.). Patient not taking: Reported on 09/01/2018 07/04/18 12/14/18  Robyn Haber, MD    Allergies Gluten meal and  Ondansetron  Family History  Problem Relation Age of Onset   Hypertension Mother    Stroke Mother    Diabetes Son 43       type 1   Hypertension Maternal Grandmother    Colon cancer Maternal Grandmother    Breast cancer Maternal Grandmother    Skin cancer Maternal Grandmother    Hypertension Maternal Grandfather    Hypertension Paternal Grandmother    Diabetes Paternal Grandmother    Hypertension Paternal Grandfather     Social History Social History   Tobacco Use   Smoking status: Never Smoker   Smokeless tobacco: Never Used  Substance Use Topics   Alcohol use: No    Alcohol/week: 0.0 standard drinks   Drug use: No    Review of Systems  Review of Systems  Constitutional: Positive for fatigue. Negative for fever.  HENT: Negative for congestion and sore throat.   Eyes: Negative for visual disturbance.  Respiratory: Negative for cough and shortness of breath.   Cardiovascular: Negative for chest pain.  Gastrointestinal: Negative for abdominal pain, diarrhea, nausea and vomiting.  Genitourinary: Negative for flank pain.  Musculoskeletal: Negative for back pain and neck pain.  Skin: Negative for rash and wound.  Neurological: Positive for facial asymmetry, weakness and headaches.  All other systems reviewed and are negative.    ____________________________________________  PHYSICAL EXAM:      VITAL SIGNS: ED Triage Vitals  Enc Vitals Group     BP 02/23/19 1303 (!) 139/100     Pulse Rate 02/23/19 1303 (!) 101     Resp 02/23/19 1303 18     Temp 02/23/19 1303 97.7 F (36.5 C)     Temp Source 02/23/19 1303 Oral     SpO2 02/23/19 1303 98 %     Weight 02/23/19 1304 145 lb (65.8 kg)     Height 02/23/19 1304 5' 6"  (1.676 m)     Head Circumference --      Peak Flow --      Pain Score 02/23/19 1304 0     Pain Loc --      Pain Edu? --      Excl. in [place]? --      Physical Exam Vitals signs and nursing note reviewed.  Constitutional:      General:  She is not in acute distress.    Appearance: She is well-developed.  HENT:     Head: Normocephalic and atraumatic.  Eyes:     Conjunctiva/sclera: Conjunctivae normal.  Neck:     Musculoskeletal: Neck supple.  Cardiovascular:     Rate and Rhythm: Normal rate and regular rhythm.     Heart sounds: Normal heart sounds. No murmur. No friction rub.  Pulmonary:     Effort: Pulmonary effort is normal. No respiratory distress.     Breath sounds: Normal breath sounds. No wheezing or rales.  Abdominal:     General: There is no distension.     Palpations: Abdomen is soft.     Tenderness: There is no abdominal tenderness.  Skin:  General: Skin is warm.     Capillary Refill: Capillary refill takes less than 2 seconds.  Neurological:     Mental Status: She is alert and oriented to person, place, and time.     Motor: No abnormal muscle tone.     Comments: Subtle left NLF flattening. Reported diminished sensation to light touch throughout LUE and LLE. Strength 5/5 bilaterally. Normal FTN. Gait normal.       ____________________________________________   LABS (all labs ordered are listed, but only abnormal results are displayed)  Labs Reviewed  GLUCOSE, CAPILLARY - Abnormal; Notable for the following components:      Result Value   Glucose-Capillary 116 (*)    All other components within normal limits  CBC - Abnormal; Notable for the following components:   Hemoglobin 15.3 (*)    All other components within normal limits  COMPREHENSIVE METABOLIC PANEL - Abnormal; Notable for the following components:   Glucose, Bld 131 (*)    All other components within normal limits  SARS CORONAVIRUS 2 (TAT 6-24 HRS)  PROTIME-INR  APTT  DIFFERENTIAL  HIV ANTIBODY (ROUTINE TESTING W REFLEX)  HEMOGLOBIN A1C  LIPID PANEL  URINE DRUG SCREEN, QUALITATIVE ([facility] ONLY)  CBC  BASIC METABOLIC PANEL    ____________________________________________  EKG: Sinus tachycardia, VR 106. Normal intervals. No  acute ST-t segment changes. No EKG evidence of acute ischemia. ________________________________________  RADIOLOGY All imaging, including plain films, CT scans, and ultrasounds, independently reviewed by me, and interpretations confirmed via formal radiology reads.  ED MD interpretation:   CT Head: [place]  Official radiology report(s): Ct Angio Head W Or Wo Contrast  Result Date: 02/24/2019 CLINICAL DATA:  Headache with slurred speech and encephalopathy. EXAM: CT ANGIOGRAPHY HEAD AND NECK TECHNIQUE: Multidetector CT imaging of the head and neck was performed using the standard protocol during bolus administration of intravenous contrast. Multiplanar CT image reconstructions and MIPs were obtained to evaluate the vascular anatomy. Carotid stenosis measurements (when applicable) are obtained utilizing NASCET criteria, using the distal internal carotid diameter as the denominator. CONTRAST:  70m OMNIPAQUE IOHEXOL 350 MG/ML SOLN COMPARISON:  Head CT 02/23/2019 and brain MRI 02/22/2019 FINDINGS: CT HEAD FINDINGS Brain: There is no mass, hemorrhage or extra-axial collection. The size and configuration of the ventricles and extra-axial CSF spaces are normal. There is no acute or chronic infarction. The brain parenchyma is normal. Skull: The visualized skull base, calvarium and extracranial soft tissues are normal. Sinuses/Orbits: No fluid levels or advanced mucosal thickening of the visualized paranasal sinuses. No mastoid or middle ear effusion. The orbits are normal. CTA NECK FINDINGS SKELETON: There is no bony spinal canal stenosis. No lytic or blastic lesion. OTHER NECK: Normal pharynx, larynx and major salivary glands. No cervical lymphadenopathy. Unremarkable thyroid gland. UPPER CHEST: No pneumothorax or pleural effusion. No nodules or masses. AORTIC ARCH: There is no calcific atherosclerosis of the aortic arch. There is no aneurysm, dissection or hemodynamically significant stenosis of the visualized  portion of the aorta. Conventional 3 vessel aortic branching pattern. The visualized proximal subclavian arteries are widely patent. RIGHT CAROTID SYSTEM: Normal without aneurysm, dissection or stenosis. LEFT CAROTID SYSTEM: Normal without aneurysm, dissection or stenosis. VERTEBRAL ARTERIES: Left dominant configuration. Both origins are clearly patent. There is no dissection, occlusion or flow-limiting stenosis to the skull base (V1-V3 segments). CTA HEAD FINDINGS POSTERIOR CIRCULATION: --Vertebral arteries: Normal V4 segments. --Posterior inferior cerebellar arteries (PICA): Patent origins from the vertebral arteries. --Anterior inferior cerebellar arteries (AICA): Patent origins from the basilar artery. --  Basilar artery: Normal. --Superior cerebellar arteries: Normal. --Posterior cerebral arteries: Normal. Both originate from the basilar artery. Posterior communicating arteries (p-comm) are diminutive or absent. ANTERIOR CIRCULATION: --Intracranial internal carotid arteries: Normal. --Anterior cerebral arteries (ACA): Normal. Both A1 segments are present. Patent anterior communicating artery (a-comm). --Middle cerebral arteries (MCA): Normal. VENOUS SINUSES: As permitted by contrast timing, patent. ANATOMIC VARIANTS: None Review of the MIP images confirms the above findings. IMPRESSION: Normal CTA of the head and neck. Electronically Signed   By: Ulyses Jarred M.D.   On: 02/24/2019 00:35   Ct Head Wo Contrast  Result Date: 02/23/2019 CLINICAL DATA:  Headache EXAM: CT HEAD WITHOUT CONTRAST TECHNIQUE: Contiguous axial images were obtained from the base of the skull through the vertex without intravenous contrast. COMPARISON:  02/20/2019 FINDINGS: Brain: No acute intracranial abnormality. Specifically, no hemorrhage, hydrocephalus, mass lesion, acute infarction, or significant intracranial injury. Vascular: No hyperdense vessel or unexpected calcification. Skull: No acute calvarial abnormality. Sinuses/Orbits:  Visualized paranasal sinuses and mastoids clear. Orbital soft tissues unremarkable. Other: None IMPRESSION: Normal study. Electronically Signed   By: Rolm Baptise M.D.   On: 02/23/2019 22:03   Ct Angio Neck W Or Wo Contrast  Result Date: 02/24/2019 CLINICAL DATA:  Headache with slurred speech and encephalopathy. EXAM: CT ANGIOGRAPHY HEAD AND NECK TECHNIQUE: Multidetector CT imaging of the head and neck was performed using the standard protocol during bolus administration of intravenous contrast. Multiplanar CT image reconstructions and MIPs were obtained to evaluate the vascular anatomy. Carotid stenosis measurements (when applicable) are obtained utilizing NASCET criteria, using the distal internal carotid diameter as the denominator. CONTRAST:  30m OMNIPAQUE IOHEXOL 350 MG/ML SOLN COMPARISON:  Head CT 02/23/2019 and brain MRI 02/22/2019 FINDINGS: CT HEAD FINDINGS Brain: There is no mass, hemorrhage or extra-axial collection. The size and configuration of the ventricles and extra-axial CSF spaces are normal. There is no acute or chronic infarction. The brain parenchyma is normal. Skull: The visualized skull base, calvarium and extracranial soft tissues are normal. Sinuses/Orbits: No fluid levels or advanced mucosal thickening of the visualized paranasal sinuses. No mastoid or middle ear effusion. The orbits are normal. CTA NECK FINDINGS SKELETON: There is no bony spinal canal stenosis. No lytic or blastic lesion. OTHER NECK: Normal pharynx, larynx and major salivary glands. No cervical lymphadenopathy. Unremarkable thyroid gland. UPPER CHEST: No pneumothorax or pleural effusion. No nodules or masses. AORTIC ARCH: There is no calcific atherosclerosis of the aortic arch. There is no aneurysm, dissection or hemodynamically significant stenosis of the visualized portion of the aorta. Conventional 3 vessel aortic branching pattern. The visualized proximal subclavian arteries are widely patent. RIGHT CAROTID  SYSTEM: Normal without aneurysm, dissection or stenosis. LEFT CAROTID SYSTEM: Normal without aneurysm, dissection or stenosis. VERTEBRAL ARTERIES: Left dominant configuration. Both origins are clearly patent. There is no dissection, occlusion or flow-limiting stenosis to the skull base (V1-V3 segments). CTA HEAD FINDINGS POSTERIOR CIRCULATION: --Vertebral arteries: Normal V4 segments. --Posterior inferior cerebellar arteries (PICA): Patent origins from the vertebral arteries. --Anterior inferior cerebellar arteries (AICA): Patent origins from the basilar artery. --Basilar artery: Normal. --Superior cerebellar arteries: Normal. --Posterior cerebral arteries: Normal. Both originate from the basilar artery. Posterior communicating arteries (p-comm) are diminutive or absent. ANTERIOR CIRCULATION: --Intracranial internal carotid arteries: Normal. --Anterior cerebral arteries (ACA): Normal. Both A1 segments are present. Patent anterior communicating artery (a-comm). --Middle cerebral arteries (MCA): Normal. VENOUS SINUSES: As permitted by contrast timing, patent. ANATOMIC VARIANTS: None Review of the MIP images confirms the above findings. IMPRESSION: Normal CTA of  the head and neck. Electronically Signed   By: Ulyses Jarred M.D.   On: 02/24/2019 00:35   Mr Brain Wo Contrast  Result Date: 02/24/2019 CLINICAL DATA:  Headache EXAM: MRI HEAD WITHOUT CONTRAST TECHNIQUE: Multiplanar, multiecho pulse sequences of the brain and surrounding structures were obtained without intravenous contrast. COMPARISON:  Brain MRI 02/22/2019 FINDINGS: BRAIN: There is no acute infarct, acute hemorrhage or extra-axial collection. The white matter signal is normal for the patient's age. The cerebral and cerebellar volume are age-appropriate. There is no hydrocephalus. The midline structures are normal. VASCULAR: The major intracranial arterial and venous sinus flow voids are normal. Susceptibility-sensitive sequences show no chronic  microhemorrhage or superficial siderosis. SKULL AND UPPER CERVICAL SPINE: Calvarial bone marrow signal is normal. There is no skull base mass. The visualized upper cervical spine and soft tissues are normal. SINUSES/ORBITS: There are no fluid levels or advanced mucosal thickening. The mastoid air cells and middle ear cavities are free of fluid. The orbits are normal. IMPRESSION: Normal MRI of the brain. Electronically Signed   By: Ulyses Jarred M.D.   On: 02/24/2019 01:03    ____________________________________________  PROCEDURES   Procedure(s) performed (including Critical Care):  Procedures  ____________________________________________  INITIAL IMPRESSION / MDM / [place] / ED COURSE  As part of my medical decision making, I reviewed the following data within the [place] notes reviewed and incorporated, Old chart reviewed, Notes from prior ED visits, and [place] Controlled Substance Database       *Lavergne Zimmerle was evaluated in Emergency Department on 02/24/2019 for the symptoms described in the history of present illness. She was evaluated in the context of the global COVID-19 pandemic, which necessitated consideration that the patient might be at risk for infection with the SARS-CoV-2 virus that causes COVID-19. Institutional protocols and algorithms that pertain to the evaluation of patients at risk for COVID-19 are in a state of rapid change based on information released by regulatory bodies including the CDC and federal and state organizations. These policies and algorithms were followed during the patient's care in the ED.  Some ED evaluations and interventions may be delayed as a result of limited staffing during the pandemic.*     Medical Decision Making:  53 yo F here with recurrent left facial numbness, subtle droop, and reported weakness. DDx includes TIA, CVA, complex migraine, conversion. Had neg MRI yesterday but sx returned and are  worsened, though improving. She is outside window for tPA with NIHSS 0. Will admit for TIA eval.  ____________________________________________  FINAL CLINICAL IMPRESSION(S) / ED DIAGNOSES  Final diagnoses:  TIA (transient ischemic attack)     MEDICATIONS GIVEN DURING THIS VISIT:  Medications   stroke: mapping our early stages of recovery book (has no administration in time range)  acetaminophen (TYLENOL) tablet 650 mg (has no administration in time range)    Or  acetaminophen (TYLENOL) 160 MG/5ML solution 650 mg (has no administration in time range)    Or  acetaminophen (TYLENOL) suppository 650 mg (has no administration in time range)  senna-docusate (Senokot-S) tablet 1 tablet (has no administration in time range)  enoxaparin (LOVENOX) injection 40 mg (has no administration in time range)  aspirin suppository 300 mg (has no administration in time range)    Or  aspirin EC tablet 325 mg (has no administration in time range)  buPROPion (WELLBUTRIN XL) 24 hr tablet 150 mg (has no administration in time range)  nitrofurantoin (macrocrystal-monohydrate) (MACROBID) capsule 100 mg (100  mg Oral Given 02/23/19 2353)  zonisamide (ZONEGRAN) capsule 25 mg (25 mg Oral Not Given 02/24/19 0141)  OLANZapine (ZYPREXA) tablet 5 mg (5 mg Oral Given 02/23/19 2353)  prochlorperazine (COMPAZINE) injection 10 mg (10 mg Intravenous Given 02/23/19 2222)  diphenhydrAMINE (BENADRYL) injection 25 mg (25 mg Intravenous Given 02/23/19 2222)  sodium chloride 0.9 % bolus 1,000 mL (0 mLs Intravenous Stopped 02/23/19 2357)  iohexol (OMNIPAQUE) 350 MG/ML injection 75 mL (75 mLs Intravenous Contrast Given 02/24/19 0003)     ED Discharge Orders    None       Note:  This document was prepared using Dragon voice recognition software and may include unintentional dictation errors.   Duffy Bruce, MD 02/24/19 [phone]

## 2019-02-23 NOTE — Telephone Encounter (Signed)
Noted and agree. She was evaluated with both CT and MRI head yesterday which did not demonstrate stroke. Could be anxiety but would recommend immediate evaluation if symptoms are as mother is reporting.

## 2019-02-23 NOTE — ED Triage Notes (Signed)
Pt presents to ED via POV with c/o "feeling like it's starting to go into a headache", per first RN pt seen yesterday for same, pt's sister also reports slurred speech at home, and some altered mental status at this time. Pt presents A&O x 4. No facial droop noted at this time, no slurred speech noted in triage. Pt states numbness started around her L eye yesterday, but has progressed further down her cheek and around her R eye today, states symptoms never resolved. Pt states called PCP and since she can't get in to see neurologist was told to come back. HTN is triage.   Grip strength equal bilaterally in triage. Facial symmetry intact. No slurred speech.  Pt states she has not scheduled F/u appt with neurologist yet.

## 2019-02-23 NOTE — ED Notes (Signed)
Warm blankets provided to pt and visitor. Call bell at left side. Pt denies needs.

## 2019-02-23 NOTE — Telephone Encounter (Signed)
Pts mother calling to see what Gentry Fitz NP would advise; pt called her mom and pt is at [employer] on [address] parking lot; pt drove there to ck BP;[facility] not taking BP now. Pt is dizzy,has H/A,pt is fading out or not thinking clearly; pt is very weak. pts mother said pt seen at [facility] ED on 02/22/19 and pt was to contact neurology but pt has not done that yet and pt has never seen neurologist; pts mom is 85' from pt; pts mom will call pt back and decide if calling 911 or pts mom is going to pick her up to take to ED. Pt should not be driving. FYI to Gentry Fitz NP.

## 2019-02-24 ENCOUNTER — Other Ambulatory Visit: Payer: PRIVATE HEALTH INSURANCE

## 2019-02-24 ENCOUNTER — Encounter: Payer: Self-pay | Admitting: Internal Medicine

## 2019-02-24 ENCOUNTER — Observation Stay: Payer: PRIVATE HEALTH INSURANCE

## 2019-02-24 ENCOUNTER — Other Ambulatory Visit: Payer: Self-pay | Admitting: Internal Medicine

## 2019-02-24 DIAGNOSIS — I10 Essential (primary) hypertension: Secondary | ICD-10-CM

## 2019-02-24 DIAGNOSIS — G43109 Migraine with aura, not intractable, without status migrainosus: Secondary | ICD-10-CM | POA: Diagnosis not present

## 2019-02-24 DIAGNOSIS — R200 Anesthesia of skin: Secondary | ICD-10-CM | POA: Diagnosis not present

## 2019-02-24 LAB — CBC
HCT: 41.4 % (ref 36.0–46.0)
Hemoglobin: 13.9 g/dL (ref 12.0–15.0)
MCH: 32.3 pg (ref 26.0–34.0)
MCHC: 33.6 g/dL (ref 30.0–36.0)
MCV: 96.1 fL (ref 80.0–100.0)
Platelets: 227 10*3/uL (ref 150–400)
RBC: 4.31 MIL/uL (ref 3.87–5.11)
RDW: 13.1 % (ref 11.5–15.5)
WBC: 4.3 10*3/uL (ref 4.0–10.5)
nRBC: 0 % (ref 0.0–0.2)

## 2019-02-24 LAB — LIPID PANEL
Cholesterol: 201 mg/dL — ABNORMAL HIGH (ref 0–200)
HDL: 35 mg/dL — ABNORMAL LOW (ref >40)
LDL Cholesterol: 122 mg/dL — ABNORMAL HIGH (ref 0–99)
Total CHOL/HDL Ratio: 5.7 RATIO
Triglycerides: 220 mg/dL — ABNORMAL HIGH (ref <150)
VLDL: 44 mg/dL — ABNORMAL HIGH (ref 0–40)

## 2019-02-24 LAB — URINE DRUG SCREEN, QUALITATIVE (ARMC ONLY)
Amphetamines, Ur Screen: NOT DETECTED
Barbiturates, Ur Screen: NOT DETECTED
Benzodiazepine, Ur Scrn: NOT DETECTED
Cannabinoid 50 Ng, Ur ~~LOC~~: NOT DETECTED
Cocaine Metabolite,Ur ~~LOC~~: NOT DETECTED
MDMA (Ecstasy)Ur Screen: NOT DETECTED
Methadone Scn, Ur: NOT DETECTED
Opiate, Ur Screen: NOT DETECTED
Phencyclidine (PCP) Ur S: NOT DETECTED
Tricyclic, Ur Screen: NOT DETECTED

## 2019-02-24 LAB — BASIC METABOLIC PANEL
Anion gap: 6 (ref 5–15)
BUN: 19 mg/dL (ref 6–20)
CO2: 23 mmol/L (ref 22–32)
Calcium: 8.4 mg/dL — ABNORMAL LOW (ref 8.9–10.3)
Chloride: 109 mmol/L (ref 98–111)
Creatinine, Ser: 0.78 mg/dL (ref 0.44–1.00)
GFR calc Af Amer: >60 mL/min (ref >60)
GFR calc non Af Amer: >60 mL/min (ref >60)
Glucose, Bld: 153 mg/dL — ABNORMAL HIGH (ref 70–99)
Potassium: 3.9 mmol/L (ref 3.5–5.1)
Sodium: 138 mmol/L (ref 135–145)

## 2019-02-24 LAB — SARS CORONAVIRUS 2 (TAT 6-24 HRS): SARS Coronavirus 2: NEGATIVE

## 2019-02-24 LAB — HEMOGLOBIN A1C
Hgb A1c MFr Bld: 5.4 % (ref 4.8–5.6)
Mean Plasma Glucose: 108.28 mg/dL

## 2019-02-24 LAB — HIV ANTIBODY (ROUTINE TESTING W REFLEX): HIV Screen 4th Generation wRfx: NONREACTIVE

## 2019-02-24 MED ORDER — ASPIRIN EC 81 MG PO TBEC: 81.0000 mg | DELAYED_RELEASE_TABLET | Freq: Every day | ORAL | 0 refills | Status: AC

## 2019-02-24 MED ORDER — BLOOD PRESSURE KIT: PACK | 0 refills | Status: DC

## 2019-02-24 NOTE — ED Notes (Signed)
Pt verbalized understanding of discharge instructions. NAD at this time. 

## 2019-02-24 NOTE — ED Notes (Signed)
Patient transported to CT 

## 2019-02-24 NOTE — ED Notes (Signed)
Lav, Lt Green, and 2 tiger tops sent to lab per redraw request. Straight stick to R [facility]

## 2019-02-24 NOTE — ED Provider Notes (Signed)
Hospitalist has opted to discharge the patient   Ariel Drafts, MD 02/24/19 1039

## 2019-02-24 NOTE — ED Notes (Signed)
Posey Pronto, MD notified of pt successful swallow screen

## 2019-02-24 NOTE — Progress Notes (Signed)
SLP Cancellation Note  Patient Details Name: Cataleah Stites MRN: [id number] DOB: [birth year]   Cancelled treatment:       Reason Eval/Treat Not Completed: SLP screened, no needs identified, will sign off(chart reviewed; consulted NSG then met w/ pt/family in room). Pt denied any difficulty swallowing and is currently on a regular diet; tolerates swallowing pills w/ water per NSG. Family member noted no difficulty when pt was drinking a drink at bedside. Pt conversed in conversational w/out gross deficits noted; pt and family member denied any current speech-language deficits or weakness currently. Pt described intermittently deficits w/ word finding and reduced articulation of speech. This occurs in conjunction w/ bilateral blurry vision, fatigue, left-sided weakness, spinning sensation/lightheadedness, and auditory hallucinations. Pt has a baseline of Migraines and HTN. MRI on 02/22/2019 and 02/24/2019 revealed "No evidence of acute intracranial abnormality".  Encouraged pt and family to f/u w/ her Migraine specialist and Neurology for ongoing assessment and management of medical issues; encouraged her to keep a notebook of information given to her and to write down any s/s of issues to talk w/ her MDs about. Pt/family agreed. NSG updated.  No further skilled ST services indicated as pt appears back at her baseline. Pt agreed. NSG to reconsult if any change in status while admitted.     Orinda Kenner, MS, CCC-SLP Toshiyuki Fredell 02/24/2019, 10:30 AM

## 2019-02-24 NOTE — ED Notes (Signed)
Pt sleeping, call bell at side.

## 2019-02-24 NOTE — ED Notes (Signed)
Pt returned to room from CT

## 2019-02-24 NOTE — ED Notes (Signed)
Pt to go to MRI after ct.

## 2019-02-24 NOTE — Discharge Summary (Signed)
Physician Discharge Summary  [name] ERD:[id number] DOB: 1965/04/29 DOA: 02/23/2019  PCP: [name], NP  Admit date: 02/23/2019 Discharge date: 02/25/2019  Discharge disposition: Home   Recommendations for Outpatient Follow-Up:    Outpatient follow-up with neurologist and psychiatrist recommend as soon as possible.    Discharge Diagnosis:   Principal Problem:   Left facial numbness Active Problems:   Migraine with visual aura   Adjustment disorder with mixed anxiety and depressed mood   Essential hypertension    Discharge Condition: Stable.  Diet recommendation: Low sodium, heart healthy.   Code status: Full code    Hospital Course:   [name] is a 53 year old woman with history of hypertension, migraine headache, depression, anxiety. She presented to the hospital with worsening left facial numbness, slurred speech, fatigue, dysphasia, left-sided neck.  She said her symptoms have been going on intermittently for about 5 days prior to admission.  She was admitted to telemetry for observation.  She had an extensive work-up for stroke but this was negative.  Differential diagnoses include anxiety, complex migraine, TIA.  She said she was supposed to see a neurologist as an outpatient but she could not make the appointment and she had to come to the ED because of worsening symptoms.  All her symptoms have resolved and she is deemed stable for discharge to home.  Discharge plan was discussed with patient and her mother at the bedside. Patient has been advised to take low dose Aspirin until she sees the neurologist as an outpatient. Risks and benefits were discussed and she agreed to take Aspirin.     Medical Consultants:    None.   Discharge Exam:   Vitals:   02/24/19 0830 02/24/19 1037  BP: 106/77 123/84  Pulse: 80 90  Resp:  18  Temp:    SpO2: 94% 97%   Vitals:   02/24/19 0730 02/24/19 0800 02/24/19 0830 02/24/19 1037  BP: 122/86 116/79  106/77 123/84  Pulse: 72 84 80 90  Resp:    18  Temp:      TempSrc:      SpO2: 96% 96% 94% 97%  Weight:      Height:         GEN: NAD SKIN: No ras EYES: EOMI, PERRLA, no pallor or icterus ENT: MMM CV: RRR PULM: CTA B ABD: soft, ND, NT, +BS CNS: AAO x 3, non focal EXT: No edema or tenderness PSYCH: Calm and cooperative, good insight   The results of significant diagnostics from this hospitalization (including imaging, microbiology, ancillary and laboratory) are listed below for reference.     Procedures and Diagnostic Studies:   Ct Angio Head W Or Wo Contrast  Result Date: 02/24/2019 CLINICAL DATA:  Headache with slurred speech and encephalopathy. EXAM: CT ANGIOGRAPHY HEAD AND NECK TECHNIQUE: Multidetector CT imaging of the head and neck was performed using the standard protocol during bolus administration of intravenous contrast. Multiplanar CT image reconstructions and MIPs were obtained to evaluate the vascular anatomy. Carotid stenosis measurements (when applicable) are obtained utilizing NASCET criteria, using the distal internal carotid diameter as the denominator. CONTRAST:  50m OMNIPAQUE IOHEXOL 350 MG/ML SOLN COMPARISON:  Head CT 02/23/2019 and brain MRI 02/22/2019 FINDINGS: CT HEAD FINDINGS Brain: There is no mass, hemorrhage or extra-axial collection. The size and configuration of the ventricles and extra-axial CSF spaces are normal. There is no acute or chronic infarction. The brain parenchyma is normal. Skull: The visualized skull base, calvarium and extracranial soft tissues are  normal. Sinuses/Orbits: No fluid levels or advanced mucosal thickening of the visualized paranasal sinuses. No mastoid or middle ear effusion. The orbits are normal. CTA NECK FINDINGS SKELETON: There is no bony spinal canal stenosis. No lytic or blastic lesion. OTHER NECK: Normal pharynx, larynx and major salivary glands. No cervical lymphadenopathy. Unremarkable thyroid gland. UPPER CHEST: No  pneumothorax or pleural effusion. No nodules or masses. AORTIC ARCH: There is no calcific atherosclerosis of the aortic arch. There is no aneurysm, dissection or hemodynamically significant stenosis of the visualized portion of the aorta. Conventional 3 vessel aortic branching pattern. The visualized proximal subclavian arteries are widely patent. RIGHT CAROTID SYSTEM: Normal without aneurysm, dissection or stenosis. LEFT CAROTID SYSTEM: Normal without aneurysm, dissection or stenosis. VERTEBRAL ARTERIES: Left dominant configuration. Both origins are clearly patent. There is no dissection, occlusion or flow-limiting stenosis to the skull base (V1-V3 segments). CTA HEAD FINDINGS POSTERIOR CIRCULATION: --Vertebral arteries: Normal V4 segments. --Posterior inferior cerebellar arteries (PICA): Patent origins from the vertebral arteries. --Anterior inferior cerebellar arteries (AICA): Patent origins from the basilar artery. --Basilar artery: Normal. --Superior cerebellar arteries: Normal. --Posterior cerebral arteries: Normal. Both originate from the basilar artery. Posterior communicating arteries (p-comm) are diminutive or absent. ANTERIOR CIRCULATION: --Intracranial internal carotid arteries: Normal. --Anterior cerebral arteries (ACA): Normal. Both A1 segments are present. Patent anterior communicating artery (a-comm). --Middle cerebral arteries (MCA): Normal. VENOUS SINUSES: As permitted by contrast timing, patent. ANATOMIC VARIANTS: None Review of the MIP images confirms the above findings. IMPRESSION: Normal CTA of the head and neck. Electronically Signed   By: Ulyses Jarred M.D.   On: 02/24/2019 00:35   Ct Head Wo Contrast  Result Date: 02/23/2019 CLINICAL DATA:  Headache EXAM: CT HEAD WITHOUT CONTRAST TECHNIQUE: Contiguous axial images were obtained from the base of the skull through the vertex without intravenous contrast. COMPARISON:  02/20/2019 FINDINGS: Brain: No acute intracranial abnormality.  Specifically, no hemorrhage, hydrocephalus, mass lesion, acute infarction, or significant intracranial injury. Vascular: No hyperdense vessel or unexpected calcification. Skull: No acute calvarial abnormality. Sinuses/Orbits: Visualized paranasal sinuses and mastoids clear. Orbital soft tissues unremarkable. Other: None IMPRESSION: Normal study. Electronically Signed   By: Rolm Baptise M.D.   On: 02/23/2019 22:03   Ct Angio Neck W Or Wo Contrast  Result Date: 02/24/2019 CLINICAL DATA:  Headache with slurred speech and encephalopathy. EXAM: CT ANGIOGRAPHY HEAD AND NECK TECHNIQUE: Multidetector CT imaging of the head and neck was performed using the standard protocol during bolus administration of intravenous contrast. Multiplanar CT image reconstructions and MIPs were obtained to evaluate the vascular anatomy. Carotid stenosis measurements (when applicable) are obtained utilizing NASCET criteria, using the distal internal carotid diameter as the denominator. CONTRAST:  64m OMNIPAQUE IOHEXOL 350 MG/ML SOLN COMPARISON:  Head CT 02/23/2019 and brain MRI 02/22/2019 FINDINGS: CT HEAD FINDINGS Brain: There is no mass, hemorrhage or extra-axial collection. The size and configuration of the ventricles and extra-axial CSF spaces are normal. There is no acute or chronic infarction. The brain parenchyma is normal. Skull: The visualized skull base, calvarium and extracranial soft tissues are normal. Sinuses/Orbits: No fluid levels or advanced mucosal thickening of the visualized paranasal sinuses. No mastoid or middle ear effusion. The orbits are normal. CTA NECK FINDINGS SKELETON: There is no bony spinal canal stenosis. No lytic or blastic lesion. OTHER NECK: Normal pharynx, larynx and major salivary glands. No cervical lymphadenopathy. Unremarkable thyroid gland. UPPER CHEST: No pneumothorax or pleural effusion. No nodules or masses. AORTIC ARCH: There is no calcific atherosclerosis of the aortic  arch. There is no  aneurysm, dissection or hemodynamically significant stenosis of the visualized portion of the aorta. Conventional 3 vessel aortic branching pattern. The visualized proximal subclavian arteries are widely patent. RIGHT CAROTID SYSTEM: Normal without aneurysm, dissection or stenosis. LEFT CAROTID SYSTEM: Normal without aneurysm, dissection or stenosis. VERTEBRAL ARTERIES: Left dominant configuration. Both origins are clearly patent. There is no dissection, occlusion or flow-limiting stenosis to the skull base (V1-V3 segments). CTA HEAD FINDINGS POSTERIOR CIRCULATION: --Vertebral arteries: Normal V4 segments. --Posterior inferior cerebellar arteries (PICA): Patent origins from the vertebral arteries. --Anterior inferior cerebellar arteries (AICA): Patent origins from the basilar artery. --Basilar artery: Normal. --Superior cerebellar arteries: Normal. --Posterior cerebral arteries: Normal. Both originate from the basilar artery. Posterior communicating arteries (p-comm) are diminutive or absent. ANTERIOR CIRCULATION: --Intracranial internal carotid arteries: Normal. --Anterior cerebral arteries (ACA): Normal. Both A1 segments are present. Patent anterior communicating artery (a-comm). --Middle cerebral arteries (MCA): Normal. VENOUS SINUSES: As permitted by contrast timing, patent. ANATOMIC VARIANTS: None Review of the MIP images confirms the above findings. IMPRESSION: Normal CTA of the head and neck. Electronically Signed   By: Ulyses Jarred M.D.   On: 02/24/2019 00:35   Mr Brain Wo Contrast  Result Date: 02/24/2019 CLINICAL DATA:  Headache EXAM: MRI HEAD WITHOUT CONTRAST TECHNIQUE: Multiplanar, multiecho pulse sequences of the brain and surrounding structures were obtained without intravenous contrast. COMPARISON:  Brain MRI 02/22/2019 FINDINGS: BRAIN: There is no acute infarct, acute hemorrhage or extra-axial collection. The white matter signal is normal for the patient's age. The cerebral and cerebellar  volume are age-appropriate. There is no hydrocephalus. The midline structures are normal. VASCULAR: The major intracranial arterial and venous sinus flow voids are normal. Susceptibility-sensitive sequences show no chronic microhemorrhage or superficial siderosis. SKULL AND UPPER CERVICAL SPINE: Calvarial bone marrow signal is normal. There is no skull base mass. The visualized upper cervical spine and soft tissues are normal. SINUSES/ORBITS: There are no fluid levels or advanced mucosal thickening. The mastoid air cells and middle ear cavities are free of fluid. The orbits are normal. IMPRESSION: Normal MRI of the brain. Electronically Signed   By: Ulyses Jarred M.D.   On: 02/24/2019 01:03     Labs:   Basic Metabolic Panel: Recent Labs  Lab 02/20/19 1613 02/20/19 1639 02/22/19 1258 02/23/19 1307 02/24/19 0429  NA 138 142 141 139 138  K 3.6 3.7 3.6 3.7 3.9  CL 106 107 108 106 109  CO2 23  --  23 24 23   GLUCOSE 106* 102* 116* 131* 153*  BUN 13 14 14 13 19   CREATININE 0.83 0.70 0.72 0.82 0.78  CALCIUM 8.9  --  9.0 8.9 8.4*   GFR Estimated Creatinine Clearance: 77 mL/min (by C-G formula based on SCr of 0.78 mg/dL). Liver Function Tests: Recent Labs  Lab 02/20/19 1613 02/22/19 1258 02/23/19 1307  AST 19 23 22   ALT 18 22 23   ALKPHOS 92 94 100  BILITOT 0.3 0.7 0.6  PROT 6.1* 6.7 7.0  ALBUMIN 3.5 3.8 3.7   No results for input(s): LIPASE, AMYLASE in the last 168 hours. No results for input(s): AMMONIA in the last 168 hours. Coagulation profile Recent Labs  Lab 02/20/19 1613 02/23/19 1307  INR 0.9 0.9    CBC: Recent Labs  Lab 02/20/19 1613 02/20/19 1639 02/22/19 1258 02/23/19 1307 02/24/19 0632  WBC 4.9  --  5.3 4.1 4.3  NEUTROABS 3.0  --   --  2.4  --   HGB 14.5 14.6 15.0 15.3*  13.9  HCT 43.5 43.0 43.0 44.3 41.4  MCV 98.2  --  93.3 93.1 96.1  PLT 261  --  248 258 227   Cardiac Enzymes: No results for input(s): CKTOTAL, CKMB, CKMBINDEX, TROPONINI in the last  168 hours. BNP: Invalid input(s): POCBNP CBG: Recent Labs  Lab 02/23/19 1305  GLUCAP 116*   D-Dimer No results for input(s): DDIMER in the last 72 hours. Hgb A1c Recent Labs    02/24/19 0632  HGBA1C 5.4   Lipid Profile Recent Labs    02/24/19 0429  CHOL 201*  HDL 35*  LDLCALC 122*  TRIG 220*  CHOLHDL 5.7   Thyroid function studies No results for input(s): TSH, T4TOTAL, T3FREE, THYROIDAB in the last 72 hours.  Invalid input(s): FREET3 Anemia work up No results for input(s): VITAMINB12, FOLATE, FERRITIN, TIBC, IRON, RETICCTPCT in the last 72 hours. Microbiology Recent Results (from the past 240 hour(s))  Urine Culture     Status: Abnormal   Collection Time: 02/16/19 11:30 AM   Specimen: Urine  Result Value Ref Range Status   MICRO NUMBER: [id number]  Final   SPECIMEN QUALITY: Adequate  Final   Sample Source URINE  Final   STATUS: FINAL  Final   ISOLATE 1: Escherichia coli (A)  Final    Comment: Greater than 100,000 CFU/mL of Escherichia coli      Susceptibility   Escherichia coli - URINE CULTURE, REFLEX    AMOX/CLAVULANIC 4 Sensitive     AMPICILLIN >=32 Resistant     AMPICILLIN/SULBACTAM >=32 Resistant     CEFAZOLIN* <=4 Not Reportable      * For infections other than uncomplicated UTIcaused by E. coli, K. pneumoniae or P. mirabilis:Cefazolin is resistant if MIC > or = 8 mcg/mL.(Distinguishing susceptible versus intermediatefor isolates with MIC < or = 4 mcg/mL requiresadditional testing.)For uncomplicated UTI caused by E. coli,K. pneumoniae or P. mirabilis: Cefazolin issusceptible if MIC <32 mcg/mL and predictssusceptible to the oral agents cefaclor, cefdinir,cefpodoxime, cefprozil, cefuroxime, cephalexinand loracarbef.    CEFEPIME <=1 Sensitive     CEFTRIAXONE <=1 Sensitive     CIPROFLOXACIN <=0.25 Sensitive     LEVOFLOXACIN <=0.12 Sensitive     ERTAPENEM <=0.5 Sensitive     GENTAMICIN <=1 Sensitive     IMIPENEM <=0.25 Sensitive     NITROFURANTOIN <=16  Sensitive     PIP/TAZO <=4 Sensitive     TOBRAMYCIN <=1 Sensitive     TRIMETH/SULFA* >=320 Resistant      * For infections other than uncomplicated UTIcaused by E. coli, K. pneumoniae or P. mirabilis:Cefazolin is resistant if MIC > or = 8 mcg/mL.(Distinguishing susceptible versus intermediatefor isolates with MIC < or = 4 mcg/mL requiresadditional testing.)For uncomplicated UTI caused by E. coli,K. pneumoniae or P. mirabilis: Cefazolin issusceptible if MIC <32 mcg/mL and predictssusceptible to the oral agents cefaclor, cefdinir,cefpodoxime, cefprozil, cefuroxime, cephalexinand loracarbef.Legend:S = Susceptible  I = IntermediateR = Resistant  NS = Not susceptible* = Not tested  NR = Not reported**NN = See antimicrobic comments  SARS CORONAVIRUS 2 (TAT 6-24 HRS) Nasopharyngeal Nasopharyngeal Swab     Status: None   Collection Time: 02/23/19 10:34 PM   Specimen: Nasopharyngeal Swab  Result Value Ref Range Status   SARS Coronavirus 2 NEGATIVE NEGATIVE Final    Comment: (NOTE) SARS-CoV-2 target nucleic acids are NOT DETECTED. The SARS-CoV-2 RNA is generally detectable in upper and lower respiratory specimens during the acute phase of infection. Negative results do not preclude SARS-CoV-2 infection, do not rule out co-infections with other  pathogens, and should not be used as the sole basis for treatment or other patient management decisions. Negative results must be combined with clinical observations, patient history, and epidemiological information. The expected result is Negative. Fact Sheet for Patients: SugarRoll.be Fact Sheet for Healthcare Providers: https://www.woods-mathews.com/ This test is not yet approved or cleared by the [place] FDA and  has been authorized for detection and/or diagnosis of SARS-CoV-2 by FDA under an Emergency Use Authorization (EUA). This EUA will remain  in effect (meaning this test can be used) for the duration of  the COVID-19 declaration under Section 56 4(b)(1) of the Act, 21 U.S.C. section 360bbb-3(b)(1), unless the authorization is terminated or revoked sooner. Performed at [facility], [place] [address], [place], [place] [address]      Discharge Instructions:   Discharge Instructions    Diet - low sodium heart healthy   Complete by: As directed    Increase activity slowly   Complete by: As directed      Allergies as of 02/24/2019      Reactions   Gluten Meal Diarrhea, Nausea Only      Ondansetron Other (See Comments)   Helps her nausea, but makes HA worse      Medication List    TAKE these medications   aspirin EC 81 MG tablet Take 1 tablet (81 mg total) by mouth daily.   Blood Pressure Kit Use arm blood pressure kit to monitor blood pressure at home   buPROPion 150 MG 24 hr tablet Commonly known as: WELLBUTRIN XL Take 150 mg by mouth daily.   hydrOXYzine 25 MG tablet Commonly known as: ATARAX/VISTARIL Take 1 tablet (25 mg total) by mouth every 6 (six) hours. What changed: when to take this   lisinopril 20 MG tablet Commonly known as: ZESTRIL Take 1 tablet (20 mg total) by mouth daily. For blood pressure   nitrofurantoin (macrocrystal-monohydrate) 100 MG capsule Commonly known as: Macrobid Take 1 capsule (100 mg total) by mouth 2 (two) times daily.   OLANZapine 5 MG tablet Commonly known as: ZYPREXA Take 1 tablet (5 mg total) by mouth at bedtime.   omeprazole 20 MG capsule Commonly known as: PRILOSEC TAKE 1 CAPSULE BY MOUTH EVERY DAY   zonisamide 25 MG capsule Commonly known as: ZONEGRAN Take 25 mg by mouth 4 (four) times daily.      Follow-up Information    Schedule an appointment as soon as possible for a visit  with Vladimir Crofts, MD.   Specialty: Neurology Contact information: [place] [facility] [place] [address] [phone]            Time coordinating discharge: 30  minutes Signed:  Jennye Boroughs  Pager [phone] [facility] 02/25/2019, 8:58 PM

## 2019-03-02 ENCOUNTER — Other Ambulatory Visit: Payer: Self-pay

## 2019-03-02 ENCOUNTER — Encounter: Payer: Self-pay | Admitting: Primary Care

## 2019-03-02 ENCOUNTER — Ambulatory Visit: Payer: PRIVATE HEALTH INSURANCE | Admitting: Primary Care

## 2019-03-02 VITALS — BP 122/82 | HR 82 | Temp 96.2°F | Ht 66.0 in | Wt 156.0 lb

## 2019-03-02 DIAGNOSIS — I10 Essential (primary) hypertension: Secondary | ICD-10-CM

## 2019-03-02 DIAGNOSIS — E785 Hyperlipidemia, unspecified: Secondary | ICD-10-CM | POA: Diagnosis not present

## 2019-03-02 DIAGNOSIS — F4323 Adjustment disorder with mixed anxiety and depressed mood: Secondary | ICD-10-CM | POA: Diagnosis not present

## 2019-03-02 DIAGNOSIS — E782 Mixed hyperlipidemia: Secondary | ICD-10-CM | POA: Insufficient documentation

## 2019-03-02 DIAGNOSIS — R200 Anesthesia of skin: Secondary | ICD-10-CM | POA: Diagnosis not present

## 2019-03-02 MED ORDER — LISINOPRIL 20 MG PO TABS: 20.0000 mg | ORAL_TABLET | Freq: Every day | ORAL | 3 refills | Status: DC

## 2019-03-02 MED ORDER — ATORVASTATIN CALCIUM 20 MG PO TABS: 20.0000 mg | ORAL_TABLET | Freq: Every evening | ORAL | 3 refills | Status: DC

## 2019-03-02 NOTE — Progress Notes (Signed)
Subjective:    Patient ID: [name], female    DOB: 11-18-1965, 53 y.o.   MRN: [id number]  HPI  Ms. Kurtzman is a 53 year old female with a history of migraines, hypertension, anxiety and depression who presents today with for follow up of hypertension and hospital follow up.  She's presented to the [facility] ED on 02/22/19 and 02/23/19 for complaints of diminished vision, left sided weakness, headaches, confusion. She underwent MRI on 02/22/19 which was negative for acute stroke. During her visit on 02/23/19 she underwent repeat CT head which was negative. She was admitted for her symptoms.   During her stay she underwent repeat MRI Brain, CT angio head and neck which were unremarkable. Labs were grossly unremarkable except for some hyperglycemia (normal A1C) and mild hyperlipidemia. Symptoms resolved so she was discharged home.  She presented to our office as a new patient on 02/16/19 and was noted to have elevated BP readings. Also moderate anxiety with some depression and had recently established with psychiatry. Given her elevated readings at home and in the office we initiated treatment with lisinopril 20 mg. Her lisinopril was held during her recent admission.  She's been checking her BP at home which is running 120's/80's for the most part, some 130's/80's. She continues to have intermittent symptoms restlessness, dizzy, left sided facial and generalized weakness, extreme anger/aggression towards herself and husband, dilated/"glossy" eyes,  numbness to bilateral face. She has an appointment scheduled with neurology next week.   Her mother has a form today for temporary disability as she cannot work. She cannot drive due to anxiousness/aggitation. She cannot also focus on tasks, has generalized weakness, has "delusional thinking", anger outbursts, paranoid feelings.   This visit occurred during the SARS-CoV-2 public health emergency.  Safety protocols were in place, including screening  questions prior to the visit, additional usage of staff PPE, and extensive cleaning of exam room while observing appropriate contact time as indicated for disinfecting solutions.     The 10-year ASCVD risk score Mikey Bussing DC Brooke Bonito., et al., 2013) is: 3%   Values used to calculate the score:     Age: 58 years     Sex: Female     Is Non-Hispanic African American: No     Diabetic: No     Tobacco smoker: No     Systolic Blood Pressure: 846 mmHg     Is BP treated: Yes     HDL Cholesterol: 35 mg/dL     Total Cholesterol: 201 mg/dL   BP Readings from Last 3 Encounters:  03/02/19 122/82  02/24/19 123/84  02/22/19 130/88     Review of Systems  Respiratory: Negative for shortness of breath.   Cardiovascular: Negative for chest pain.  Neurological: Positive for numbness. Negative for dizziness and weakness.  Psychiatric/Behavioral: The patient is nervous/anxious.        See HPI       Past Medical History:  Diagnosis Date  . Anemia   . BV (bacterial vaginosis) 11/27/2012  . Celiac disease   . Depression   . Essential hypertension   . Family history of adverse reaction to anesthesia    MOM-HARD TIME WAKING UP  . GERD (gastroesophageal reflux disease)   . Migraine with visual aura    MIGRAINES  . UTI (lower urinary tract infection)      Social History   Socioeconomic History  . Marital status: Married    Spouse name: Jenny Reichmann  . Number of children: 3  . Years  of education: 17  . Highest education level: Not on file  Occupational History  . Occupation: Personnel officer  Social Needs  . Financial resource strain: Not on file  . Food insecurity    Worry: Not on file    Inability: Not on file  . Transportation needs    Medical: Not on file    Non-medical: Not on file  Tobacco Use  . Smoking status: Never Smoker  . Smokeless tobacco: Never Used  Substance and Sexual Activity  . Alcohol use: No    Alcohol/week: 0.0 standard drinks  . Drug use: No  . Sexual  activity: Yes    Partners: Female    Birth control/protection: Surgical    Comment: INTERCOURSE AGE 36, SEXUAL PARTNERS LEES THAN 6  Lifestyle  . Physical activity    Days per week: Not on file    Minutes per session: Not on file  . Stress: Not on file  Relationships  . Social Herbalist on phone: Not on file    Gets together: Not on file    Attends religious service: Not on file    Active member of club or organization: Not on file    Attends meetings of clubs or organizations: Not on file    Relationship status: Not on file  . Intimate partner violence    Fear of current or ex partner: Not on file    Emotionally abused: Not on file    Physically abused: Not on file    Forced sexual activity: Not on file  Other Topics Concern  . Not on file  Social History Narrative   Lives with her husband and their three children, and her daughter's boyfriend.  Her older son's daughter lives there part-time as well.    Past Surgical History:  Procedure Laterality Date  . BLADDER SUSPENSION    . EXPLORATORY LAPAROTOMY    . IUD REMOVAL  07/25/2017   Procedure: INTRAUTERINE DEVICE (IUD) REMOVAL;  Surgeon: Harlin Heys, MD;  Location: [facility] ORS;  Service: Gynecology;;  . LAPAROSCOPIC ASSISTED VAGINAL HYSTERECTOMY Bilateral 07/25/2017   Procedure: LAPAROSCOPIC ASSISTED VAGINAL HYSTERECTOMY WITH BILATERAL [place] OOPHERECTOMY;  Surgeon: Harlin Heys, MD;  Location: [facility] ORS;  Service: Gynecology;  Laterality: Bilateral;  . TUBAL LIGATION      Family History  Problem Relation Age of Onset  . Hypertension Mother   . Stroke Mother   . Diabetes Son 12       type 1  . Hypertension Maternal Grandmother   . Colon cancer Maternal Grandmother   . Breast cancer Maternal Grandmother   . Skin cancer Maternal Grandmother   . Hypertension Maternal Grandfather   . Hypertension Paternal Grandmother   . Diabetes Paternal Grandmother   . Hypertension Paternal Grandfather      Allergies  Allergen Reactions  . Gluten Meal Diarrhea and Nausea Only       . Ondansetron Other (See Comments)    Helps her nausea, but makes HA worse    Current Outpatient Medications on File Prior to Visit  Medication Sig Dispense Refill  . aspirin EC 81 MG tablet Take 1 tablet (81 mg total) by mouth daily. (Patient taking differently: Take 162 mg by mouth daily. ) 30 tablet 0  . Blood Pressure KIT Use arm blood pressure kit to monitor blood pressure at home 1 kit 0  . escitalopram (LEXAPRO) 10 MG tablet Take 10 mg by mouth daily.    . hydrOXYzine (  ATARAX/VISTARIL) 25 MG tablet Take 1 tablet (25 mg total) by mouth every 6 (six) hours. (Patient taking differently: Take 25 mg by mouth 2 (two) times daily. ) 12 tablet 0  . lisinopril (ZESTRIL) 20 MG tablet Take 1 tablet (20 mg total) by mouth daily. For blood pressure 30 tablet 0  . omeprazole (PRILOSEC) 20 MG capsule TAKE 1 CAPSULE BY MOUTH EVERY DAY 90 capsule 1  . zonisamide (ZONEGRAN) 25 MG capsule Take 25 mg by mouth 4 (four) times daily.     . [DISCONTINUED] albuterol (PROVENTIL HFA;VENTOLIN HFA) 108 (90 Base) MCG/ACT inhaler Inhale 2 puffs into the lungs every 4 (four) hours as needed for wheezing or shortness of breath (cough, shortness of breath or wheezing.). (Patient not taking: Reported on 09/01/2018) 1 Inhaler 1   No current facility-administered medications on file prior to visit.     BP 122/82   Pulse 82   Temp (!) 96.2 F (35.7 C) (Temporal)   Ht _0  (1.676 m)   Wt 156 lb (70.8 kg)   LMP  (LMP Unknown)   SpO2 98%   BMI 25.18 kg/m    Objective:   Physical Exam  Constitutional: She is oriented to person, place, and time. She appears well-nourished.  Eyes: EOM are normal.  Neck: Neck supple.  Cardiovascular: Normal rate and regular rhythm.  Respiratory: Effort normal and breath sounds normal.  Neurological: She is alert and oriented to person, place, and time. No cranial nerve deficit. Coordination normal.   Skin: Skin is warm and dry.  Psychiatric: She has a normal mood and affect.           Assessment & Plan:

## 2019-03-02 NOTE — Patient Instructions (Addendum)
Continue taking lisinopril 20 mg for blood pressure.  Follow up with the neurologist next week as scheduled.  Continue to see the therapist and psychiatrist.  Start atorvastatin 20 mg tablets for cholesterol. Take this in the evening.  Please schedule a follow up appointment in 2 months.  It was a pleasure to see you today!

## 2019-03-02 NOTE — Assessment & Plan Note (Signed)
Evaluated and admitted to [facility] ED. Work up grossly negative for Stroke, determined to have TIA?  Labs, notes, imaging reviewed from hospital stay.   Given symptoms and recent admission we agreed to treat hyperlipidemia with lipid lower medication. Rx for atorvastatin 20 mg sent to pharmacy.  Neuro exam today unremarkable. Do suspect that some of her symptoms are stemming from uncontrolled anxiety.

## 2019-03-02 NOTE — Assessment & Plan Note (Signed)
LDL of 122 on recent labs. Given symptoms coupled with recent TIA diagnosis we will treat with atorvastatin 20 mg.  Repeat lipids next visit.

## 2019-03-02 NOTE — Assessment & Plan Note (Addendum)
Continued. Following with psychiatry and therapy. Continue current regimen, new medications updated.   Agree to complete form for temporary disability.

## 2019-03-02 NOTE — Assessment & Plan Note (Signed)
Improved with lisinopril 20 mg, home readings are stable for the most part. BMP reviewed from recent hospital stay. Continue lisinopril 20 mg.

## 2019-03-05 ENCOUNTER — Telehealth: Payer: Self-pay | Admitting: Family Medicine

## 2019-03-05 NOTE — Telephone Encounter (Signed)
Pt dropped off disability form for [name] to fill out. Pt will pay $15 fee when form is ready for pick up. Copy of form was given to pt. Pprwrk left in provider's box at nurses station

## 2019-03-06 ENCOUNTER — Telehealth: Payer: Self-pay | Admitting: Primary Care

## 2019-03-06 NOTE — Telephone Encounter (Signed)
Pt dropped in to check on status of disability paperwork that ws dropped off to be completed by provider FR

## 2019-03-07 ENCOUNTER — Emergency Department
Admission: EM | Admit: 2019-03-07 | Discharge: 2019-03-08 | Disposition: A | Discharge status: Home / Self Care | Payer: PRIVATE HEALTH INSURANCE | Attending: Emergency Medicine | Admitting: Emergency Medicine

## 2019-03-07 ENCOUNTER — Encounter: Payer: Self-pay | Admitting: *Deleted

## 2019-03-07 ENCOUNTER — Other Ambulatory Visit: Payer: Self-pay

## 2019-03-07 ENCOUNTER — Emergency Department: Payer: PRIVATE HEALTH INSURANCE

## 2019-03-07 DIAGNOSIS — R079 Chest pain, unspecified: Secondary | ICD-10-CM | POA: Diagnosis not present

## 2019-03-07 DIAGNOSIS — Z5321 Procedure and treatment not carried out due to patient leaving prior to being seen by health care provider: Secondary | ICD-10-CM | POA: Insufficient documentation

## 2019-03-07 LAB — BASIC METABOLIC PANEL
Anion gap: 8 (ref 5–15)
BUN: 22 mg/dL — ABNORMAL HIGH (ref 6–20)
CO2: 21 mmol/L — ABNORMAL LOW (ref 22–32)
Calcium: 9.1 mg/dL (ref 8.9–10.3)
Chloride: 110 mmol/L (ref 98–111)
Creatinine, Ser: 0.74 mg/dL (ref 0.44–1.00)
GFR calc Af Amer: >60 mL/min (ref >60)
GFR calc non Af Amer: >60 mL/min (ref >60)
Glucose, Bld: 205 mg/dL — ABNORMAL HIGH (ref 70–99)
Potassium: 4.2 mmol/L (ref 3.5–5.1)
Sodium: 139 mmol/L (ref 135–145)

## 2019-03-07 LAB — CBC
HCT: 42.4 % (ref 36.0–46.0)
Hemoglobin: 14.1 g/dL (ref 12.0–15.0)
MCH: 32 pg (ref 26.0–34.0)
MCHC: 33.3 g/dL (ref 30.0–36.0)
MCV: 96.1 fL (ref 80.0–100.0)
Platelets: 247 10*3/uL (ref 150–400)
RBC: 4.41 MIL/uL (ref 3.87–5.11)
RDW: 12.5 % (ref 11.5–15.5)
WBC: 6.9 10*3/uL (ref 4.0–10.5)
nRBC: 0 % (ref 0.0–0.2)

## 2019-03-07 LAB — TROPONIN I (HIGH SENSITIVITY): Troponin I (High Sensitivity): 3 ng/L (ref <18)

## 2019-03-07 NOTE — ED Triage Notes (Signed)
Pt to ED reporting a tightness in her chest that started this morning. Pain is constant and pt denies anything helping or worsening the pain today. Pt reporting intermittent lightheadedness with a near syncopal episode while descending stairs today.  No SOB or dizziness. Chronic cough. Hx of GERD.

## 2019-03-08 LAB — HEPATIC FUNCTION PANEL
ALT: 17 U/L (ref 0–44)
AST: 22 U/L (ref 15–41)
Albumin: 3.6 g/dL (ref 3.5–5.0)
Alkaline Phosphatase: 84 U/L (ref 38–126)
Bilirubin, Direct: 0.2 mg/dL (ref 0.0–0.2)
Indirect Bilirubin: 0.5 mg/dL (ref 0.3–0.9)
Total Bilirubin: 0.7 mg/dL (ref 0.3–1.2)
Total Protein: 6.8 g/dL (ref 6.5–8.1)

## 2019-03-08 LAB — LIPASE, BLOOD: Lipase: 41 U/L (ref 11–51)

## 2019-03-08 LAB — TROPONIN I (HIGH SENSITIVITY): Troponin I (High Sensitivity): 3 ng/L (ref <18)

## 2019-03-08 NOTE — ED Notes (Signed)
Pt reports leaving now due to long wait; instr to return for any new or worsening symptoms

## 2019-03-09 ENCOUNTER — Telehealth: Payer: Self-pay | Admitting: Emergency Medicine

## 2019-03-09 NOTE — Telephone Encounter (Signed)
Called patient due to lwot to inquire about condition and follow up plans. She says she is feeling better.  Encouraged her to call her pcp and tell them the symptoms that she had.  Explained that normal labs and ekg does not rule out heart attack.  Also told her to return as needed--especially if chest pain comes back.

## 2019-03-12 ENCOUNTER — Encounter: Payer: Self-pay | Admitting: Emergency Medicine

## 2019-03-12 ENCOUNTER — Other Ambulatory Visit: Payer: Self-pay

## 2019-03-12 ENCOUNTER — Emergency Department: Payer: PRIVATE HEALTH INSURANCE

## 2019-03-12 ENCOUNTER — Emergency Department
Admission: EM | Admit: 2019-03-12 | Discharge: 2019-03-12 | Disposition: A | Discharge status: Home / Self Care | Payer: PRIVATE HEALTH INSURANCE | Attending: Emergency Medicine | Admitting: Emergency Medicine

## 2019-03-12 DIAGNOSIS — Z79899 Other long term (current) drug therapy: Secondary | ICD-10-CM | POA: Diagnosis not present

## 2019-03-12 DIAGNOSIS — Z7982 Long term (current) use of aspirin: Secondary | ICD-10-CM | POA: Insufficient documentation

## 2019-03-12 DIAGNOSIS — R079 Chest pain, unspecified: Secondary | ICD-10-CM | POA: Diagnosis not present

## 2019-03-12 DIAGNOSIS — I10 Essential (primary) hypertension: Secondary | ICD-10-CM | POA: Insufficient documentation

## 2019-03-12 LAB — BASIC METABOLIC PANEL
Anion gap: 11 (ref 5–15)
BUN: 22 mg/dL — ABNORMAL HIGH (ref 6–20)
CO2: 20 mmol/L — ABNORMAL LOW (ref 22–32)
Calcium: 8.4 mg/dL — ABNORMAL LOW (ref 8.9–10.3)
Chloride: 109 mmol/L (ref 98–111)
Creatinine, Ser: 0.64 mg/dL (ref 0.44–1.00)
GFR calc Af Amer: >60 mL/min (ref >60)
GFR calc non Af Amer: >60 mL/min (ref >60)
Glucose, Bld: 128 mg/dL — ABNORMAL HIGH (ref 70–99)
Potassium: 4.3 mmol/L (ref 3.5–5.1)
Sodium: 140 mmol/L (ref 135–145)

## 2019-03-12 LAB — CBC
HCT: 44.7 % (ref 36.0–46.0)
Hemoglobin: 14.8 g/dL (ref 12.0–15.0)
MCH: 32.7 pg (ref 26.0–34.0)
MCHC: 33.1 g/dL (ref 30.0–36.0)
MCV: 98.7 fL (ref 80.0–100.0)
Platelets: 234 10*3/uL (ref 150–400)
RBC: 4.53 MIL/uL (ref 3.87–5.11)
RDW: 13.2 % (ref 11.5–15.5)
WBC: 5.5 10*3/uL (ref 4.0–10.5)
nRBC: 0 % (ref 0.0–0.2)

## 2019-03-12 LAB — TROPONIN I (HIGH SENSITIVITY)
Troponin I (High Sensitivity): 3 ng/L (ref <18)
Troponin I (High Sensitivity): 3 ng/L (ref <18)

## 2019-03-12 LAB — FIBRIN DERIVATIVES D-DIMER (ARMC ONLY): Fibrin derivatives D-dimer (ARMC): 296.59 ng/mL (FEU) (ref 0.00–499.00)

## 2019-03-12 MED ORDER — MORPHINE SULFATE (PF) 4 MG/ML IV SOLN
4.0000 mg | Freq: Once | INTRAVENOUS | Status: AC
  Administered 2019-03-12: 4 mg via INTRAVENOUS
  Filled 2019-03-12: qty 1

## 2019-03-12 MED ORDER — ASPIRIN 81 MG PO CHEW
162.0000 mg | CHEWABLE_TABLET | Freq: Once | ORAL | Status: AC
  Administered 2019-03-12: 162 mg via ORAL
  Filled 2019-03-12: qty 2

## 2019-03-12 NOTE — Discharge Instructions (Addendum)
Your work-up is reassuring.  You should follow with cardiology.  Return to the ER for any other concerns.

## 2019-03-12 NOTE — ED Provider Notes (Signed)
[facility] Emergency Department Provider Note  ____________________________________________   First MD Initiated Contact with Patient 03/12/19 1400     (approximate)  I have reviewed the triage vital signs and the nursing notes.   HISTORY  Chief Complaint Chest Pain    HPI [name] is a 53 y.o. female with hypertension, migraine headache, depression, anxiety who had an admission in November 2020 for stroke work-up that was negative and thought to be more likely related to complex migraines.  Patient came in on 11/25 for chest tightness.  However she left before being seen.  She now presents for chest pain.  She states she has had intermittent episodes of chest pain occur at different times.  Usually at rest though.  She had some episodes with pain rating down her arm but currently her chest pain that she has now started at 8 AM, radiates into the shoulder, nothing makes it better, nothing makes it worse.  She says that her anxiety has been well controlled recently.  She does endorse little bit of shortness of breath and feels she is catching her breath with breathing.  Denies any prior blood clots.          Past Medical History:  Diagnosis Date  . Anemia   . BV (bacterial vaginosis) 11/27/2012  . Celiac disease   . Depression   . Essential hypertension   . Family history of adverse reaction to anesthesia    MOM-HARD TIME WAKING UP  . GERD (gastroesophageal reflux disease)   . Migraine with visual aura    MIGRAINES  . UTI (lower urinary tract infection)     Patient Active Problem List   Diagnosis Date Noted  . Hyperlipidemia 03/02/2019  . Left facial numbness 02/23/2019  . Urinary incontinence 02/18/2019  . Essential hypertension 02/18/2019  . Recurrent cystitis 11/27/2012  . Migraine with visual aura 09/19/2011  . Adjustment disorder with mixed anxiety and depressed mood 09/19/2011    Past Surgical History:  Procedure Laterality Date   . BLADDER SUSPENSION    . EXPLORATORY LAPAROTOMY    . IUD REMOVAL  07/25/2017   Procedure: INTRAUTERINE DEVICE (IUD) REMOVAL;  Surgeon: Harlin Heys, MD;  Location: [facility] ORS;  Service: Gynecology;;  . LAPAROSCOPIC ASSISTED VAGINAL HYSTERECTOMY Bilateral 07/25/2017   Procedure: LAPAROSCOPIC ASSISTED VAGINAL HYSTERECTOMY WITH BILATERAL [place] OOPHERECTOMY;  Surgeon: Harlin Heys, MD;  Location: [facility] ORS;  Service: Gynecology;  Laterality: Bilateral;  . TUBAL LIGATION      Prior to Admission medications   Medication Sig Start Date End Date Taking? Authorizing Provider  aspirin EC 81 MG tablet Take 1 tablet (81 mg total) by mouth daily. Patient taking differently: Take 162 mg by mouth daily.  02/24/19 03/26/19  Jennye Boroughs, MD  atorvastatin (LIPITOR) 20 MG tablet Take 1 tablet (20 mg total) by mouth every evening. For cholesterol. 03/02/19   [name], NP  Blood Pressure KIT Use arm blood pressure kit to monitor blood pressure at home 02/24/19   Jennye Boroughs, MD  escitalopram (LEXAPRO) 10 MG tablet Take 10 mg by mouth daily. 02/27/19   [provider]  hydrOXYzine (ATARAX/VISTARIL) 25 MG tablet Take 1 tablet (25 mg total) by mouth every 6 (six) hours. Patient taking differently: Take 25 mg by mouth 2 (two) times daily.  01/23/19   Bast, Tressia Miners A, NP  lisinopril (ZESTRIL) 20 MG tablet Take 1 tablet (20 mg total) by mouth daily. For blood pressure 03/02/19   Alma Friendly  K, NP  omeprazole (PRILOSEC) 20 MG capsule TAKE 1 CAPSULE BY MOUTH EVERY DAY 11/10/18   Delia Chimes A, MD  zonisamide (ZONEGRAN) 25 MG capsule Take 25 mg by mouth 4 (four) times daily.  01/13/19   [provider]  albuterol (PROVENTIL HFA;VENTOLIN HFA) 108 (90 Base) MCG/ACT inhaler Inhale 2 puffs into the lungs every 4 (four) hours as needed for wheezing or shortness of breath (cough, shortness of breath or wheezing.). Patient not taking: Reported on 09/01/2018 07/04/18 12/14/18   Robyn Haber, MD    Allergies Gluten meal and Ondansetron  Family History  Problem Relation Age of Onset  . Hypertension Mother   . Stroke Mother   . Diabetes Son 12       type 1  . Hypertension Maternal Grandmother   . Colon cancer Maternal Grandmother   . Breast cancer Maternal Grandmother   . Skin cancer Maternal Grandmother   . Hypertension Maternal Grandfather   . Hypertension Paternal Grandmother   . Diabetes Paternal Grandmother   . Hypertension Paternal Grandfather     Social History Social History   Tobacco Use  . Smoking status: Never Smoker  . Smokeless tobacco: Never Used  Substance Use Topics  . Alcohol use: No    Alcohol/week: 0.0 standard drinks  . Drug use: No      Review of Systems Constitutional: No fever/chills Eyes: No visual changes. ENT: No sore throat. Cardiovascular: Positive chest pain Respiratory: Denies shortness of breath. Gastrointestinal: No abdominal pain.  No nausea, no vomiting.  No diarrhea.  No constipation. Genitourinary: Negative for dysuria. Musculoskeletal: Negative for back pain. Skin: Negative for rash. Neurological: Negative for headaches, focal weakness or numbness. All other ROS negative ____________________________________________   PHYSICAL EXAM:  VITAL SIGNS: ED Triage Vitals  Enc Vitals Group     BP 03/12/19 1242 127/85     Pulse Rate 03/12/19 1242 95     Resp 03/12/19 1242 16     Temp 03/12/19 1242 98.2 F (36.8 C)     Temp src --      SpO2 03/12/19 1242 98 %     Weight --      Height --      Head Circumference --      Peak Flow --      Pain Score 03/12/19 1250 2     Pain Loc --      Pain Edu? --      Excl. in [place]? --     Constitutional: Alert and oriented. Well appearing and in no acute distress. Eyes: Conjunctivae are normal. EOMI. Head: Atraumatic. Nose: No congestion/rhinnorhea. Mouth/Throat: Mucous membranes are moist.   Neck: No stridor. Trachea Midline. FROM Cardiovascular: Normal  rate, regular rhythm. Grossly normal heart sounds.  Good peripheral circulation. Respiratory: Normal respiratory effort.  No retractions. Lungs CTAB. Gastrointestinal: Soft and nontender. No distention. No abdominal bruits.  Musculoskeletal: No lower extremity tenderness nor edema.  No joint effusions. Neurologic:  Normal speech and language. No gross focal neurologic deficits are appreciated.  Skin:  Skin is warm, dry and intact. No rash noted. Psychiatric: Mood and affect are normal. Speech and behavior are normal. GU: Deferred   ____________________________________________   LABS (all labs ordered are listed, but only abnormal results are displayed)  Labs Reviewed  BASIC METABOLIC PANEL - Abnormal; Notable for the following components:      Result Value   CO2 20 (*)    Glucose, Bld 128 (*)    BUN 22 (*)  Calcium 8.4 (*)    All other components within normal limits  CBC  FIBRIN DERIVATIVES D-DIMER ([facility] ONLY)  TROPONIN I (HIGH SENSITIVITY)  TROPONIN I (HIGH SENSITIVITY)   ____________________________________________   ED ECG REPORT I, Vanessa Pisinemo, the attending physician, personally viewed and interpreted this ECG.  EKG is normal sinus rate of 92, no ST elevations, flipped T wave in lead III, S1q 3 T3, normal intervals ____________________________________________  RADIOLOGY Robert Bellow, personally viewed and evaluated these images (plain radiographs) as part of my medical decision making, as well as reviewing the written report by the radiologist.  ED MD interpretation: No pneumonia  Official radiology report(s): Dg Chest 2 View  Result Date: 03/12/2019 CLINICAL DATA:  Chest pain EXAM: CHEST - 2 VIEW COMPARISON:  March 07, 2019 FINDINGS: Lungs are clear. Heart size and pulmonary vascularity are normal. No adenopathy. No bone lesions. No pneumothorax. IMPRESSION: No edema or consolidation. Electronically Signed   By: Lowella Grip III M.D.   On: 03/12/2019  13:13    ____________________________________________   PROCEDURES  Procedure(s) performed (including Critical Care):  Procedures   ____________________________________________   INITIAL IMPRESSION / ASSESSMENT AND PLAN / ED COURSE   [name] was evaluated in Emergency Department on 03/12/2019 for the symptoms described in the history of present illness. She was evaluated in the context of the global COVID-19 pandemic, which necessitated consideration that the patient might be at risk for infection with the SARS-CoV-2 virus that causes COVID-19. Institutional protocols and algorithms that pertain to the evaluation of patients at risk for COVID-19 are in a state of rapid change based on information released by regulatory bodies including the CDC and federal and state organizations. These policies and algorithms were followed during the patient's care in the ED.    Most Likely DDx:  -MSK (atypical chest pain) but will get cardiac markers to evaluate for ACS given risk factors/age =  DDx that was also considered d/t potential to cause harm, but was found less likely based on history and physical (as detailed above): -PNA (no fevers, cough but CXR to evaluate) -PNX (reassured with equal b/l breath sounds, CXR to evaluate) -Symptomatic anemia (will get H&H) -Pulmonary embolism as no sob at rest, but given no ekg and some sob with exertion will get ddimer -Aortic Dissection as no tearing pain and no radiation to the mid back, pulses equal -Pericarditis no rub on exam, EKG changes or hx to suggest dx -Tamponade (no notable SOB, tachycardic, hypotensive) -Esophageal rupture (no h/o diffuse vomitting/no crepitus)  Patient handed off pending repeat troponin and D-dimer.  Or if negative she has low heart score therefore she can follow with cardiology as long as her symptoms are improving.     ____________________________________________   FINAL CLINICAL IMPRESSION(S) / ED DIAGNOSES    Final diagnoses:  Chest pain, unspecified type     MEDICATIONS GIVEN DURING THIS VISIT:  Medications  morphine 4 MG/ML injection 4 mg (4 mg Intravenous Given 03/12/19 1502)  aspirin chewable tablet 162 mg (162 mg Oral Given 03/12/19 1501)     ED Discharge Orders    None       Note:  This document was prepared using Dragon voice recognition software and may include unintentional dictation errors.   Vanessa [place], MD 03/12/19 [phone]

## 2019-03-12 NOTE — ED Provider Notes (Signed)
-----------------------------------------   3:08 PM on 03/12/2019 -----------------------------------------  Blood pressure 135/87, pulse 85, temperature 98.2 F (36.8 C), resp. rate 16, SpO2 99 %.  Assuming care from Dr. Jari Pigg.  In short, [name] is a 53 y.o. female with a chief complaint of Chest Pain .  Refer to the original H&P for additional details.  The current plan of care is to follow-up repeat troponin and d-dimer.  D-dimer within normal limits and troponin is also negative.  Patient's chest pain is improved and low suspicion for cardiac etiology.  Patient counseled to follow-up with cardiology and to return to the ED for new or worsening symptoms, patient agrees with plan.    Blake Divine, MD 03/12/19 [phone]

## 2019-03-12 NOTE — Telephone Encounter (Addendum)
Noted. Labs and nursing notes reviewed, labs and ECG not suggestive of ACS. She's had multiple visits to the ED for chest pain without evidence of ACS. Recent hospital admission for TIA, although imaging was without evidence of stroke. Patient currently being followed by psychiatry and is under treatment.

## 2019-03-12 NOTE — Telephone Encounter (Signed)
Message left for patient to return my call.  

## 2019-03-12 NOTE — ED Notes (Signed)
Patient denies chest tightness at this time.

## 2019-03-12 NOTE — ED Triage Notes (Signed)
Says left sided chest pain--down left arm--started this am.  Says she did have this day before thanksgiving, but she got better and was not seen.

## 2019-03-12 NOTE — Telephone Encounter (Signed)
Did you get this in your inbox? I believe I completed this last week.

## 2019-03-14 NOTE — Telephone Encounter (Signed)
Spoken to patient and she stated that it was a form that need to be fill at [place]. According to patient, this was completed by the office at [facility]

## 2019-03-15 NOTE — Telephone Encounter (Signed)
Dr Greene please advise. Dgaddy, CMA 

## 2019-03-15 NOTE — Telephone Encounter (Signed)
This was completed few days ago. Let me know if not received yet.

## 2019-03-19 ENCOUNTER — Other Ambulatory Visit: Payer: Self-pay

## 2019-03-19 ENCOUNTER — Emergency Department
Admission: EM | Admit: 2019-03-19 | Discharge: 2019-03-19 | Disposition: A | Discharge status: Home / Self Care | Payer: PRIVATE HEALTH INSURANCE | Attending: Emergency Medicine | Admitting: Emergency Medicine

## 2019-03-19 ENCOUNTER — Emergency Department: Payer: PRIVATE HEALTH INSURANCE

## 2019-03-19 DIAGNOSIS — R05 Cough: Secondary | ICD-10-CM | POA: Insufficient documentation

## 2019-03-19 DIAGNOSIS — N3090 Cystitis, unspecified without hematuria: Secondary | ICD-10-CM | POA: Diagnosis not present

## 2019-03-19 DIAGNOSIS — R531 Weakness: Secondary | ICD-10-CM | POA: Diagnosis present

## 2019-03-19 DIAGNOSIS — Z79899 Other long term (current) drug therapy: Secondary | ICD-10-CM | POA: Insufficient documentation

## 2019-03-19 DIAGNOSIS — I10 Essential (primary) hypertension: Secondary | ICD-10-CM | POA: Diagnosis not present

## 2019-03-19 DIAGNOSIS — Z7982 Long term (current) use of aspirin: Secondary | ICD-10-CM | POA: Insufficient documentation

## 2019-03-19 DIAGNOSIS — R0789 Other chest pain: Secondary | ICD-10-CM | POA: Insufficient documentation

## 2019-03-19 DIAGNOSIS — R5383 Other fatigue: Secondary | ICD-10-CM | POA: Diagnosis not present

## 2019-03-19 DIAGNOSIS — R82998 Other abnormal findings in urine: Secondary | ICD-10-CM | POA: Diagnosis not present

## 2019-03-19 LAB — URINALYSIS, COMPLETE (UACMP) WITH MICROSCOPIC
Bilirubin Urine: NEGATIVE
Glucose, UA: NEGATIVE mg/dL
Hgb urine dipstick: NEGATIVE
Ketones, ur: NEGATIVE mg/dL
Nitrite: POSITIVE — AB
Protein, ur: NEGATIVE mg/dL
Specific Gravity, Urine: 1.019 (ref 1.005–1.030)
pH: 5 (ref 5.0–8.0)

## 2019-03-19 LAB — CBC
HCT: 43.6 % (ref 36.0–46.0)
Hemoglobin: 14.5 g/dL (ref 12.0–15.0)
MCH: 32.6 pg (ref 26.0–34.0)
MCHC: 33.3 g/dL (ref 30.0–36.0)
MCV: 98 fL (ref 80.0–100.0)
Platelets: 246 10*3/uL (ref 150–400)
RBC: 4.45 MIL/uL (ref 3.87–5.11)
RDW: 12.7 % (ref 11.5–15.5)
WBC: 5.5 10*3/uL (ref 4.0–10.5)
nRBC: 0 % (ref 0.0–0.2)

## 2019-03-19 LAB — BASIC METABOLIC PANEL
Anion gap: 7 (ref 5–15)
BUN: 20 mg/dL (ref 6–20)
CO2: 27 mmol/L (ref 22–32)
Calcium: 8.9 mg/dL (ref 8.9–10.3)
Chloride: 107 mmol/L (ref 98–111)
Creatinine, Ser: 0.67 mg/dL (ref 0.44–1.00)
GFR calc Af Amer: >60 mL/min (ref >60)
GFR calc non Af Amer: >60 mL/min (ref >60)
Glucose, Bld: 86 mg/dL (ref 70–99)
Potassium: 4 mmol/L (ref 3.5–5.1)
Sodium: 141 mmol/L (ref 135–145)

## 2019-03-19 LAB — T4, FREE: Free T4: 0.75 ng/dL (ref 0.61–1.12)

## 2019-03-19 LAB — TROPONIN I (HIGH SENSITIVITY): Troponin I (High Sensitivity): 5 ng/L (ref <18)

## 2019-03-19 LAB — TSH: TSH: 1.016 u[IU]/mL (ref 0.350–4.500)

## 2019-03-19 MED ORDER — SODIUM CHLORIDE 0.9 % IV SOLN
1.0000 g | Freq: Once | INTRAVENOUS | Status: AC
  Administered 2019-03-19: 1 g via INTRAVENOUS
  Filled 2019-03-19: qty 10

## 2019-03-19 MED ORDER — CEFUROXIME AXETIL 250 MG PO TABS: 250.0000 mg | ORAL_TABLET | Freq: Two times a day (BID) | ORAL | 0 refills | Status: AC

## 2019-03-19 MED ORDER — SODIUM CHLORIDE 0.9 % IV BOLUS
1000.0000 mL | Freq: Once | INTRAVENOUS | Status: AC
  Administered 2019-03-19: 1000 mL via INTRAVENOUS

## 2019-03-19 MED ORDER — SODIUM CHLORIDE 0.9% FLUSH: 3.0000 mL | Freq: Once | INTRAVENOUS | Status: DC

## 2019-03-19 NOTE — ED Triage Notes (Signed)
Pt c/o generalized weakness today, states she just couldn't stay awake.. states she has been having multiple issues with chest pain and has been seeing a cardiologist and has a scheduled stress and echocardiogram. Pt is in nAD. Speech is clear.

## 2019-03-19 NOTE — Discharge Instructions (Signed)
Your urine was concerning for UTI.  We started you on antibiotic for it.  Please follow-up with the culture results in the next 2 to 3 days.  Return to the ER if you develop fevers, worsening pain or any other concerns

## 2019-03-19 NOTE — ED Provider Notes (Signed)
[facility] Emergency Department Provider Note  ____________________________________________   First MD Initiated Contact with Patient 03/19/19 1527     (approximate)  I have reviewed the triage vital signs and the nursing notes.   HISTORY  Chief Complaint Weakness    HPI [name] is a 53 y.o. female with hypertension, depression who presents with generalized weakness that started today.  Patient states that she does feel extremely fatigued.  She is falling asleep very frequently, intermittent, nothing makes better, nothing makes it worse.  Has been associate with some chest pressure this morning but none currently.  She does have a little bit of a cough as well.  She endorses a foul smell to her urine. She states she felt too weak to walk earlier.   Actually saw patient on 11/30 for chest pain.  She had negative troponins and D-dimer was discharged home due to low suspicion of cardiac origin.  Patient has follow-up for cardiac stress in 1 week.          Past Medical History:  Diagnosis Date  . Anemia   . BV (bacterial vaginosis) 11/27/2012  . Celiac disease   . Depression   . Essential hypertension   . Family history of adverse reaction to anesthesia    MOM-HARD TIME WAKING UP  . GERD (gastroesophageal reflux disease)   . Migraine with visual aura    MIGRAINES  . UTI (lower urinary tract infection)     Patient Active Problem List   Diagnosis Date Noted  . Hyperlipidemia 03/02/2019  . Left facial numbness 02/23/2019  . Urinary incontinence 02/18/2019  . Essential hypertension 02/18/2019  . Recurrent cystitis 11/27/2012  . Migraine with visual aura 09/19/2011  . Adjustment disorder with mixed anxiety and depressed mood 09/19/2011    Past Surgical History:  Procedure Laterality Date  . BLADDER SUSPENSION    . EXPLORATORY LAPAROTOMY    . IUD REMOVAL  07/25/2017   Procedure: INTRAUTERINE DEVICE (IUD) REMOVAL;  Surgeon: Harlin Heys, MD;  Location: [facility] ORS;  Service: Gynecology;;  . LAPAROSCOPIC ASSISTED VAGINAL HYSTERECTOMY Bilateral 07/25/2017   Procedure: LAPAROSCOPIC ASSISTED VAGINAL HYSTERECTOMY WITH BILATERAL [place] OOPHERECTOMY;  Surgeon: Harlin Heys, MD;  Location: [facility] ORS;  Service: Gynecology;  Laterality: Bilateral;  . TUBAL LIGATION      Prior to Admission medications   Medication Sig Start Date End Date Taking? Authorizing Provider  aspirin EC 81 MG tablet Take 1 tablet (81 mg total) by mouth daily. Patient taking differently: Take 162 mg by mouth daily.  02/24/19 03/26/19  Jennye Boroughs, MD  atorvastatin (LIPITOR) 20 MG tablet Take 1 tablet (20 mg total) by mouth every evening. For cholesterol. 03/02/19   [name], NP  Blood Pressure KIT Use arm blood pressure kit to monitor blood pressure at home 02/24/19   Jennye Boroughs, MD  escitalopram (LEXAPRO) 10 MG tablet Take 10 mg by mouth daily. 02/27/19   [provider]  hydrOXYzine (ATARAX/VISTARIL) 25 MG tablet Take 1 tablet (25 mg total) by mouth every 6 (six) hours. Patient taking differently: Take 25 mg by mouth 2 (two) times daily.  01/23/19   Bast, Tressia Miners A, NP  lisinopril (ZESTRIL) 20 MG tablet Take 1 tablet (20 mg total) by mouth daily. For blood pressure 03/02/19   [name], NP  omeprazole (PRILOSEC) 20 MG capsule TAKE 1 CAPSULE BY MOUTH EVERY DAY 11/10/18   Forrest Moron, MD  zonisamide (ZONEGRAN) 25 MG capsule Take 25  mg by mouth 4 (four) times daily.  01/13/19   [provider]  albuterol (PROVENTIL HFA;VENTOLIN HFA) 108 (90 Base) MCG/ACT inhaler Inhale 2 puffs into the lungs every 4 (four) hours as needed for wheezing or shortness of breath (cough, shortness of breath or wheezing.). Patient not taking: Reported on 09/01/2018 07/04/18 12/14/18  Robyn Haber, MD    Allergies Gluten meal and Ondansetron  Family History  Problem Relation Age of Onset  . Hypertension Mother   . Stroke Mother    . Diabetes Son 12       type 1  . Hypertension Maternal Grandmother   . Colon cancer Maternal Grandmother   . Breast cancer Maternal Grandmother   . Skin cancer Maternal Grandmother   . Hypertension Maternal Grandfather   . Hypertension Paternal Grandmother   . Diabetes Paternal Grandmother   . Hypertension Paternal Grandfather     Social History Social History   Tobacco Use  . Smoking status: Never Smoker  . Smokeless tobacco: Never Used  Substance Use Topics  . Alcohol use: No    Alcohol/week: 0.0 standard drinks  . Drug use: No      Review of Systems Constitutional: No fever/chills, positive weakness Eyes: No visual changes. ENT: No sore throat. Cardiovascular: Chest pain this morning now resolved Respiratory: Denies shortness of breath. Gastrointestinal: No abdominal pain.  No nausea, no vomiting.  No diarrhea.  No constipation. Genitourinary: Negative for dysuria.  Foul smell to urine Musculoskeletal: Negative for back pain. Skin: Negative for rash. Neurological: Negative for headaches, focal weakness or numbness. All other ROS negative ____________________________________________   PHYSICAL EXAM:  VITAL SIGNS: ED Triage Vitals  Enc Vitals Group     BP 03/19/19 1415 137/82     Pulse Rate 03/19/19 1415 95     Resp 03/19/19 1415 16     Temp 03/19/19 1415 98.1 F (36.7 C)     Temp Source 03/19/19 1415 Oral     SpO2 03/19/19 1415 99 %     Weight 03/19/19 1416 156 lb (70.8 kg)     Height 03/19/19 1416 5' (1.524 m)     Head Circumference --      Peak Flow --      Pain Score 03/19/19 1416 0     Pain Loc --      Pain Edu? --      Excl. in [place]? --     Constitutional: Alert and oriented. Well appearing and in no acute distress. Eyes: Conjunctivae are normal. EOMI. Head: Atraumatic. Nose: No congestion/rhinnorhea. Mouth/Throat: Mucous membranes are moist.   Neck: No stridor. Trachea Midline. FROM Cardiovascular: Normal rate, regular rhythm. Grossly  normal heart sounds.  Good peripheral circulation. Respiratory: Normal respiratory effort.  No retractions. Lungs CTAB. Gastrointestinal: Soft and nontender. No distention. No abdominal bruits.  Musculoskeletal: No lower extremity tenderness nor edema.  No joint effusions. Neurologic:  Normal speech and language.  Cranial nerves II through XII are intact.  No pronator drift.  Patient is able to ambulate. Skin:  Skin is warm, dry and intact. No rash noted. Psychiatric: Mood and affect are normal. Speech and behavior are normal. GU: Deferred   ____________________________________________   LABS (all labs ordered are listed, but only abnormal results are displayed)  Labs Reviewed  URINALYSIS, COMPLETE (UACMP) WITH MICROSCOPIC - Abnormal; Notable for the following components:      Result Value   Color, Urine YELLOW (*)    APPearance HAZY (*)    Nitrite POSITIVE (*)  Leukocytes,Ua SMALL (*)    Bacteria, UA MANY (*)    All other components within normal limits  BASIC METABOLIC PANEL  CBC  TSH  T4, FREE  TROPONIN I (HIGH SENSITIVITY)   ____________________________________________   ED ECG REPORT I, Vanessa , the attending physician, personally viewed and interpreted this ECG.  Normal sinus rate of 87, no ST elevation, no T wave inversions, normal intervals ____________________________________________  RADIOLOGY Robert Bellow, personally viewed and evaluated these images (plain radiographs) as part of my medical decision making, as well as reviewing the written report by the radiologist.  ED MD interpretation: No pneumonia  Official radiology report(s): Dg Chest 1 View  Result Date: 03/19/2019 CLINICAL DATA:  Cough and shortness of breath EXAM: CHEST  1 VIEW COMPARISON:  03/12/2019 FINDINGS: Cardiac shadow is stable. Small hiatal hernia is noted. No bony abnormality is seen. The lungs are well aerated bilaterally without focal infiltrate or sizable effusion. IMPRESSION:  Small sliding-type hiatal hernia. No acute abnormality in the chest is noted. Electronically Signed   By: Inez Catalina M.D.   On: 03/19/2019 16:14    ____________________________________________   PROCEDURES  Procedure(s) performed (including Critical Care):  Procedures   ____________________________________________   INITIAL IMPRESSION / ASSESSMENT AND PLAN / ED COURSE  [name] was evaluated in Emergency Department on 03/19/2019 for the symptoms described in the history of present illness. She was evaluated in the context of the global COVID-19 pandemic, which necessitated consideration that the patient might be at risk for infection with the SARS-CoV-2 virus that causes COVID-19. Institutional protocols and algorithms that pertain to the evaluation of patients at risk for COVID-19 are in a state of rapid change based on information released by regulatory bodies including the CDC and federal and state organizations. These policies and algorithms were followed during the patient's care in the ED.    Patient is a well-appearing 53 year old who presents with generalized weakness and fatigue.  Patient has a normal neuro exam.  Given patient reports some chest pain this morning will get cardiac markers to evaluate for ACS.  Will get labs to evaluate for electrolyte abnormalities, AKI.  Will get urine to evaluate for UTI given the report of foul smell to her urine has had prior UTIs.  Will get thyroid labs given her increased fatigue.  Labs are reassuring with no evidence of infection, anemia, thyroid dysfunction.  UA is consistent with UTI.  Will send for culture.  Patient given 1 L of fluid and a dose of ceftriaxone.  On reevaluation patient states she is feeling much better.  She feels comfortable being discharged home.  ____________________________________________   FINAL CLINICAL IMPRESSION(S) / ED DIAGNOSES   Final diagnoses:  Cystitis  Weakness      MEDICATIONS GIVEN DURING  THIS VISIT:  Medications  sodium chloride flush (NS) 0.9 % injection 3 mL (0 mLs Intravenous Hold 03/19/19 1530)  sodium chloride 0.9 % bolus 1,000 mL (1,000 mLs Intravenous New Bag/Given 03/19/19 1556)  cefTRIAXone (ROCEPHIN) 1 g in sodium chloride 0.9 % 100 mL IVPB (0 g Intravenous Stopped 03/19/19 1627)     ED Discharge Orders         Ordered    cefUROXime (CEFTIN) 250 MG tablet  2 times daily with meals     03/19/19 1715           Note:  This document was prepared using Dragon voice recognition software and may include unintentional dictation errors.   Marjean Donna  E, MD 03/19/19 [phone]

## 2019-03-21 NOTE — Telephone Encounter (Signed)
Called patient and she has picked up her disability form from the office.

## 2019-03-22 LAB — URINE CULTURE: Culture: >=100000 — AB

## 2019-03-23 NOTE — Progress Notes (Signed)
Brief Pharmacy Note  Patient is a 53 y/o F with medical history of hypertension, depression who presented to [facility] ED 12/7 c/o generalized weakness. Review of symptoms negative for dysuria. Patient endorsed foul smelling urine. No fevers or chills. WBC 5.5. UA with many bacteria, 21-50 WBC. Patient received ceftriaxone 1 g x1 in the ED and was discharged home on cefuroxime x 7 day course. Urine culture has resulted >100k colonies/mL E coli (cefazolin sensitive). Appropriately covered on discharge antibiotic.   [place] Resident 23 March 2019

## 2019-03-28 ENCOUNTER — Emergency Department: Payer: PRIVATE HEALTH INSURANCE

## 2019-03-28 ENCOUNTER — Emergency Department
Admission: EM | Admit: 2019-03-28 | Discharge: 2019-03-28 | Disposition: A | Discharge status: Home / Self Care | Payer: PRIVATE HEALTH INSURANCE | Attending: Emergency Medicine | Admitting: Emergency Medicine

## 2019-03-28 ENCOUNTER — Other Ambulatory Visit: Payer: Self-pay

## 2019-03-28 DIAGNOSIS — R531 Weakness: Secondary | ICD-10-CM | POA: Insufficient documentation

## 2019-03-28 DIAGNOSIS — R202 Paresthesia of skin: Secondary | ICD-10-CM | POA: Diagnosis not present

## 2019-03-28 DIAGNOSIS — Z79899 Other long term (current) drug therapy: Secondary | ICD-10-CM | POA: Insufficient documentation

## 2019-03-28 DIAGNOSIS — G43009 Migraine without aura, not intractable, without status migrainosus: Secondary | ICD-10-CM | POA: Diagnosis not present

## 2019-03-28 DIAGNOSIS — R200 Anesthesia of skin: Secondary | ICD-10-CM | POA: Insufficient documentation

## 2019-03-28 DIAGNOSIS — R519 Headache, unspecified: Secondary | ICD-10-CM | POA: Diagnosis present

## 2019-03-28 DIAGNOSIS — I10 Essential (primary) hypertension: Secondary | ICD-10-CM | POA: Diagnosis not present

## 2019-03-28 LAB — GLUCOSE, CAPILLARY: Glucose-Capillary: 109 mg/dL — ABNORMAL HIGH (ref 70–99)

## 2019-03-28 MED ORDER — PROCHLORPERAZINE EDISYLATE 10 MG/2ML IJ SOLN
10.0000 mg | Freq: Once | INTRAMUSCULAR | Status: AC
  Administered 2019-03-28: 10 mg via INTRAVENOUS
  Filled 2019-03-28: qty 2

## 2019-03-28 MED ORDER — BUTALBITAL-APAP-CAFFEINE 50-325-40 MG PO TABS: 1.0000 | ORAL_TABLET | Freq: Four times a day (QID) | ORAL | 0 refills | Status: DC | PRN

## 2019-03-28 MED ORDER — DEXAMETHASONE SODIUM PHOSPHATE 10 MG/ML IJ SOLN
10.0000 mg | Freq: Once | INTRAMUSCULAR | Status: AC
  Administered 2019-03-28: 10 mg via INTRAVENOUS
  Filled 2019-03-28: qty 1

## 2019-03-28 MED ORDER — DIPHENHYDRAMINE HCL 50 MG/ML IJ SOLN
25.0000 mg | Freq: Once | INTRAMUSCULAR | Status: AC
  Administered 2019-03-28: 25 mg via INTRAVENOUS
  Filled 2019-03-28: qty 1

## 2019-03-28 NOTE — ED Notes (Signed)
Says her numbness feels better after meds, but still there slightly.

## 2019-03-28 NOTE — Discharge Instructions (Signed)
Continue your aspirin  Continue your outpatient work-up with Neurology  If symptoms return or worsen, return to the ER

## 2019-03-28 NOTE — ED Triage Notes (Signed)
Pt to the er for numbness to the posterior right side of her head at 0430. Pt reports burning earlier in her right arm. Pt reports right sided weakness and mild blurry vision. Pt reports chest pain last week. Face is symmetrical.

## 2019-03-28 NOTE — ED Provider Notes (Signed)
[facility] Emergency Department Provider Note  ____________________________________________   First MD Initiated Contact with Patient 03/28/19 [phone]     (approximate)  I have reviewed the triage vital signs and the nursing notes.   HISTORY  Chief Complaint Numbness    HPI [name] is a 53 y.o. female with past medical history as below including complex migraines, status post recent stroke work-up a month ago, here with headache, right-sided head numbness, general weakness.  The patient states she woke up this morning with a dull, aching, right posterior headache with an associated sensation of reported numbness/tingling.  She then began to feel like she had difficulty concentrating.  She felt some mild generalized but slightly right greater than left weakness.  She had a subsequent mild headache that is resolved.  However, on interview, the headache is now returning.  She denies any associated fever, chills.  No ongoing vision changes.  No neck pain or neck stiffness.  No numbness or weakness.  Symptoms do feel somewhat similar to her previous migraines.  She was prescribed medications for this, but has not begun taking them.  She has seen an outpatient neurologist and is currently undergoing a migraine work-up including repeat MRI and EEG in the near future.        Past Medical History:  Diagnosis Date  . Anemia   . BV (bacterial vaginosis) 11/27/2012  . Celiac disease   . Depression   . Essential hypertension   . Family history of adverse reaction to anesthesia    MOM-HARD TIME WAKING UP  . GERD (gastroesophageal reflux disease)   . Migraine with visual aura    MIGRAINES  . UTI (lower urinary tract infection)     Patient Active Problem List   Diagnosis Date Noted  . Hyperlipidemia 03/02/2019  . Left facial numbness 02/23/2019  . Urinary incontinence 02/18/2019  . Essential hypertension 02/18/2019  . Recurrent cystitis 11/27/2012  . Migraine  with visual aura 09/19/2011  . Adjustment disorder with mixed anxiety and depressed mood 09/19/2011    Past Surgical History:  Procedure Laterality Date  . BLADDER SUSPENSION    . EXPLORATORY LAPAROTOMY    . IUD REMOVAL  07/25/2017   Procedure: INTRAUTERINE DEVICE (IUD) REMOVAL;  Surgeon: Harlin Heys, MD;  Location: [facility] ORS;  Service: Gynecology;;  . LAPAROSCOPIC ASSISTED VAGINAL HYSTERECTOMY Bilateral 07/25/2017   Procedure: LAPAROSCOPIC ASSISTED VAGINAL HYSTERECTOMY WITH BILATERAL [place] OOPHERECTOMY;  Surgeon: Harlin Heys, MD;  Location: [facility] ORS;  Service: Gynecology;  Laterality: Bilateral;  . TUBAL LIGATION      Prior to Admission medications   Medication Sig Start Date End Date Taking? Authorizing Provider  atorvastatin (LIPITOR) 20 MG tablet Take 1 tablet (20 mg total) by mouth every evening. For cholesterol. 03/02/19   [name], NP  Blood Pressure KIT Use arm blood pressure kit to monitor blood pressure at home 02/24/19   Jennye Boroughs, MD  butalbital-acetaminophen-caffeine (FIORICET) 50-325-40 MG tablet Take 1 tablet by mouth every 6 (six) hours as needed for headache or migraine. 03/28/19 03/27/20  Duffy Bruce, MD  escitalopram (LEXAPRO) 10 MG tablet Take 10 mg by mouth daily. 02/27/19   [provider]  hydrOXYzine (ATARAX/VISTARIL) 25 MG tablet Take 1 tablet (25 mg total) by mouth every 6 (six) hours. Patient taking differently: Take 25 mg by mouth 2 (two) times daily.  01/23/19   Bast, Tressia Miners A, NP  lisinopril (ZESTRIL) 20 MG tablet Take 1 tablet (20 mg total) by  mouth daily. For blood pressure 03/02/19   [name], NP  omeprazole (PRILOSEC) 20 MG capsule TAKE 1 CAPSULE BY MOUTH EVERY DAY 11/10/18   Delia Chimes A, MD  zonisamide (ZONEGRAN) 25 MG capsule Take 25 mg by mouth 4 (four) times daily.  01/13/19   [provider]  albuterol (PROVENTIL HFA;VENTOLIN HFA) 108 (90 Base) MCG/ACT inhaler Inhale 2 puffs into the lungs  every 4 (four) hours as needed for wheezing or shortness of breath (cough, shortness of breath or wheezing.). Patient not taking: Reported on 09/01/2018 07/04/18 12/14/18  Robyn Haber, MD    Allergies Topamax [topiramate], Tramadol, Gluten meal, and Ondansetron  Family History  Problem Relation Age of Onset  . Hypertension Mother   . Stroke Mother   . Diabetes Son 12       type 1  . Hypertension Maternal Grandmother   . Colon cancer Maternal Grandmother   . Breast cancer Maternal Grandmother   . Skin cancer Maternal Grandmother   . Hypertension Maternal Grandfather   . Hypertension Paternal Grandmother   . Diabetes Paternal Grandmother   . Hypertension Paternal Grandfather     Social History Social History   Tobacco Use  . Smoking status: Never Smoker  . Smokeless tobacco: Never Used  Substance Use Topics  . Alcohol use: No    Alcohol/week: 0.0 standard drinks  . Drug use: No    Review of Systems  Review of Systems  Constitutional: Positive for fatigue. Negative for fever.  HENT: Negative for congestion and sore throat.   Eyes: Negative for visual disturbance.  Respiratory: Negative for cough and shortness of breath.   Cardiovascular: Negative for chest pain.  Gastrointestinal: Negative for abdominal pain, diarrhea, nausea and vomiting.  Genitourinary: Negative for flank pain.  Musculoskeletal: Negative for back pain and neck pain.  Skin: Negative for rash and wound.  Neurological: Positive for headaches. Negative for weakness.     ____________________________________________  PHYSICAL EXAM:      VITAL SIGNS: ED Triage Vitals [03/28/19 0634]  Enc Vitals Group     BP 136/88     Pulse Rate 78     Resp 16     Temp 98.6 F (37 C)     Temp Source Oral     SpO2 96 %     Weight 156 lb (70.8 kg)     Height 5' (1.524 m)     Head Circumference      Peak Flow      Pain Score 5     Pain Loc      Pain Edu?      Excl. in [place]?      Physical Exam Vitals and  nursing note reviewed.  Constitutional:      General: She is not in acute distress.    Appearance: She is well-developed.  HENT:     Head: Normocephalic and atraumatic.  Eyes:     Conjunctiva/sclera: Conjunctivae normal.  Cardiovascular:     Rate and Rhythm: Normal rate and regular rhythm.     Heart sounds: Normal heart sounds. No murmur. No friction rub.  Pulmonary:     Effort: Pulmonary effort is normal. No respiratory distress.     Breath sounds: Normal breath sounds. No wheezing or rales.  Abdominal:     General: There is no distension.     Palpations: Abdomen is soft.     Tenderness: There is no abdominal tenderness.  Musculoskeletal:     Cervical back: Neck supple.  Skin:    General: Skin is warm.     Capillary Refill: Capillary refill takes less than 2 seconds.  Neurological:     Mental Status: She is alert and oriented to person, place, and time.     Motor: No abnormal muscle tone.      Neurological Exam:  Mental Status: Alert and oriented to person, place, and time. Attention and concentration normal. Speech clear. Recent memory is intact. Cranial Nerves: Visual fields grossly intact. EOMI and PERRLA. No nystagmus noted. Facial sensation intact at forehead, maxillary cheek, and chin/mandible bilaterally. No facial asymmetry or weakness. Hearing grossly normal. Uvula is midline, and palate elevates symmetrically. Normal SCM and trapezius strength. Tongue midline without fasciculations. Motor: Muscle strength 5/5 in proximal and distal UE and LE bilaterally. No pronator drift. Muscle tone normal. Sensation: Intact to light touch in upper and lower extremities distally bilaterally.  Gait: Normal without ataxia. Coordination: Normal FTN bilaterally.    ____________________________________________   LABS (all labs ordered are listed, but only abnormal results are displayed)  Labs Reviewed  GLUCOSE, CAPILLARY - Abnormal; Notable for the following components:       Result Value   Glucose-Capillary 109 (*)    All other components within normal limits  CBG MONITORING, ED    ____________________________________________  EKG: None ________________________________________  RADIOLOGY All imaging, including plain films, CT scans, and ultrasounds, independently reviewed by me, and interpretations confirmed via formal radiology reads.  ED MD interpretation:   CT Head: [place]  Official radiology report(s): CT Head Wo Contrast  Result Date: 03/28/2019 CLINICAL DATA:  Numbness to the posterior right side of head EXAM: CT HEAD WITHOUT CONTRAST TECHNIQUE: Contiguous axial images were obtained from the base of the skull through the vertex without intravenous contrast. COMPARISON:  February 24, 2019 FINDINGS: Brain: No evidence of acute territorial infarction, hemorrhage, hydrocephalus,extra-axial collection or mass lesion/mass effect. Normal gray-white differentiation. Ventricles are normal in size and contour. Vascular: No hyperdense vessel or unexpected calcification. Skull: The skull is intact. No fracture or focal lesion identified. Sinuses/Orbits: The visualized paranasal sinuses and mastoid air cells are clear. The orbits and globes intact. Other: None IMPRESSION: No acute intracranial abnormality. Electronically Signed   By: Prudencio Pair M.D.   On: 03/28/2019 08:04    ____________________________________________  PROCEDURES   Procedure(s) performed (including Critical Care):  Procedures  ____________________________________________  INITIAL IMPRESSION / MDM / [place] / ED COURSE  As part of my medical decision making, I reviewed the following data within the [place] notes reviewed and incorporated, Old chart reviewed, Notes from prior ED visits, and [place] Controlled Substance Database       *[name] was evaluated in Emergency Department on 03/28/2019 for the symptoms described in the history of present  illness. She was evaluated in the context of the global COVID-19 pandemic, which necessitated consideration that the patient might be at risk for infection with the SARS-CoV-2 virus that causes COVID-19. Institutional protocols and algorithms that pertain to the evaluation of patients at risk for COVID-19 are in a state of rapid change based on information released by regulatory bodies including the CDC and federal and state organizations. These policies and algorithms were followed during the patient's care in the ED.  Some ED evaluations and interventions may be delayed as a result of limited staffing during the pandemic.*     Medical Decision Making:  53 yo very pleasant female here with posterior and R sided head numbness, R>L  weakness sensation, and mild R HA. History of recent admission for possible TIA, diagnosed with complicated migraine and being f/u by Neurology. Suspect sx today are due to recurrent complex migraine, less likely TIA. Reviewed CT Angio and MRI from recent admission - no high grade obstructions, no sx to suggest high risk of large CVA. She is already on ASA and medically optimized from this standpoint. The somewhat atypical distribution and her history is more c/w complex migraine. No fever, neck pain/stiffnses or s/s to suggest meningiris, encephalitis, or infectious etiology. No focal deficits on my exam here. HA is not severe, thunderclap, do not suspect SAH.   ____________________________________________  FINAL CLINICAL IMPRESSION(S) / ED DIAGNOSES  Final diagnoses:  Numbness  Atypical migraine     MEDICATIONS GIVEN DURING THIS VISIT:  Medications  prochlorperazine (COMPAZINE) injection 10 mg (10 mg Intravenous Given 03/28/19 0806)  diphenhydrAMINE (BENADRYL) injection 25 mg (25 mg Intravenous Given 03/28/19 0807)  dexamethasone (DECADRON) injection 10 mg (10 mg Intravenous Given 03/28/19 5806)     ED Discharge Orders         Ordered     butalbital-acetaminophen-caffeine (FIORICET) 50-325-40 MG tablet  Every 6 hours PRN     03/28/19 0907           Note:  This document was prepared using Dragon voice recognition software and may include unintentional dictation errors.   Duffy Bruce, MD 03/28/19 [phone]

## 2019-03-28 NOTE — ED Notes (Signed)
Says she woke up at 4 am and head felt funny.  Numbness posterior left head.  No headache.  Has historyo f migraines, but does not feel like that.

## 2019-04-02 ENCOUNTER — Ambulatory Visit: Payer: PRIVATE HEALTH INSURANCE | Admitting: Urology

## 2019-04-02 ENCOUNTER — Other Ambulatory Visit: Payer: Self-pay

## 2019-04-09 ENCOUNTER — Other Ambulatory Visit: Payer: Self-pay

## 2019-04-09 ENCOUNTER — Emergency Department
Admission: EM | Admit: 2019-04-09 | Discharge: 2019-04-09 | Disposition: A | Discharge status: Home / Self Care | Payer: PRIVATE HEALTH INSURANCE | Attending: Student | Admitting: Student

## 2019-04-09 ENCOUNTER — Telehealth: Payer: Self-pay

## 2019-04-09 ENCOUNTER — Encounter: Payer: Self-pay | Admitting: Emergency Medicine

## 2019-04-09 DIAGNOSIS — Z79899 Other long term (current) drug therapy: Secondary | ICD-10-CM | POA: Insufficient documentation

## 2019-04-09 DIAGNOSIS — I10 Essential (primary) hypertension: Secondary | ICD-10-CM | POA: Diagnosis not present

## 2019-04-09 DIAGNOSIS — R5383 Other fatigue: Secondary | ICD-10-CM

## 2019-04-09 LAB — CBC WITH DIFFERENTIAL/PLATELET
Abs Immature Granulocytes: 0.02 10*3/uL (ref 0.00–0.07)
Basophils Absolute: 0 10*3/uL (ref 0.0–0.1)
Basophils Relative: 1 %
Eosinophils Absolute: 0.3 10*3/uL (ref 0.0–0.5)
Eosinophils Relative: 6 %
HCT: 41.9 % (ref 36.0–46.0)
Hemoglobin: 14.1 g/dL (ref 12.0–15.0)
Immature Granulocytes: 1 %
Lymphocytes Relative: 22 %
Lymphs Abs: 1 10*3/uL (ref 0.7–4.0)
MCH: 32.3 pg (ref 26.0–34.0)
MCHC: 33.7 g/dL (ref 30.0–36.0)
MCV: 95.9 fL (ref 80.0–100.0)
Monocytes Absolute: 0.4 10*3/uL (ref 0.1–1.0)
Monocytes Relative: 9 %
Neutro Abs: 2.7 10*3/uL (ref 1.7–7.7)
Neutrophils Relative %: 61 %
Platelets: 238 10*3/uL (ref 150–400)
RBC: 4.37 MIL/uL (ref 3.87–5.11)
RDW: 12.3 % (ref 11.5–15.5)
WBC: 4.4 10*3/uL (ref 4.0–10.5)
nRBC: 0 % (ref 0.0–0.2)

## 2019-04-09 LAB — COMPREHENSIVE METABOLIC PANEL
ALT: 27 U/L (ref 0–44)
AST: 29 U/L (ref 15–41)
Albumin: 3.7 g/dL (ref 3.5–5.0)
Alkaline Phosphatase: 81 U/L (ref 38–126)
Anion gap: 7 (ref 5–15)
BUN: 13 mg/dL (ref 6–20)
CO2: 27 mmol/L (ref 22–32)
Calcium: 9 mg/dL (ref 8.9–10.3)
Chloride: 108 mmol/L (ref 98–111)
Creatinine, Ser: 0.55 mg/dL (ref 0.44–1.00)
GFR calc Af Amer: >60 mL/min (ref >60)
GFR calc non Af Amer: >60 mL/min (ref >60)
Glucose, Bld: 108 mg/dL — ABNORMAL HIGH (ref 70–99)
Potassium: 4.2 mmol/L (ref 3.5–5.1)
Sodium: 142 mmol/L (ref 135–145)
Total Bilirubin: 0.7 mg/dL (ref 0.3–1.2)
Total Protein: 6.5 g/dL (ref 6.5–8.1)

## 2019-04-09 LAB — URINALYSIS, COMPLETE (UACMP) WITH MICROSCOPIC
Bacteria, UA: NONE SEEN
Bilirubin Urine: NEGATIVE
Glucose, UA: NEGATIVE mg/dL
Hgb urine dipstick: NEGATIVE
Ketones, ur: NEGATIVE mg/dL
Leukocytes,Ua: NEGATIVE
Nitrite: NEGATIVE
Protein, ur: NEGATIVE mg/dL
Specific Gravity, Urine: 1.016 (ref 1.005–1.030)
pH: 7 (ref 5.0–8.0)

## 2019-04-09 LAB — TROPONIN I (HIGH SENSITIVITY): Troponin I (High Sensitivity): 3 ng/L (ref <18)

## 2019-04-09 NOTE — Telephone Encounter (Signed)
Noted  

## 2019-04-09 NOTE — Telephone Encounter (Signed)
pts mom (DPR signed) loss of strength from neck down; unstable on feet; feels like no oxygen in body. H/A on and off. Pulse ox 92-96% now pulse ox is 91%. Pt has congestion,pt has dry cough and thinks pt is retaining fluid but pt does not appear swollen.but pt having difficulty in breathing. For 2 days has had tingling in both feet but no tingling now. BP running OK cannot remember numbers; pt does not have way to ck BP now. Pt stays sleepy and having above symptoms since 04/05/19. Pt will go to [facility] ED now for eval and testing. FYI to Gentry Fitz NP.

## 2019-04-09 NOTE — ED Triage Notes (Signed)
Pt reports on 12/24 she was walking in [employer] and everything from her neck down felt empty. Pt reports her oxygen level has also been reading low.

## 2019-04-09 NOTE — ED Notes (Signed)
See triage note  Presents with some numbness which occurred on 12/24  And states that her o2 sats were low    Denies any pain

## 2019-04-09 NOTE — ED Provider Notes (Signed)
Emergency Department Provider Note  ____________________________________________  Time seen: Approximately 4:15 PM  I have reviewed the triage vital signs and the nursing notes.   HISTORY  Chief Complaint No chief complaint on file.   Historian Patient    HPI [name] is a 53 y.o. female with a history of depression, hypertension and migraines presents to the emergency department with concern for chronic fatigue and a low pulse ox reading which was evaluated at home.  Patient reports that pulse ox read 93%.  Patient states that she has a chronic dry cough secondary to lisinopril usage but denies fever, shortness of breath, chest tightness or chest pain.  She has had no associated rhinorrhea, nasal congestion, body aches or fever.  Patient states that on Christmas eve, she was shopping at the [employer] and had a sensation of emptiness but denies any other new symptoms.  Patient states that she called her primary care provider about her low pulse ox reading and sensation of emptiness on Christmas Eve and was referred to the emergency department for further evaluation.  Patient reports that she recently had a cardiac stress test which was reassuring.   Past Medical History:  Diagnosis Date  . Anemia   . BV (bacterial vaginosis) 11/27/2012  . Celiac disease   . Depression   . Essential hypertension   . Family history of adverse reaction to anesthesia    MOM-HARD TIME WAKING UP  . GERD (gastroesophageal reflux disease)   . Migraine with visual aura    MIGRAINES  . UTI (lower urinary tract infection)      Immunizations up to date:  Yes.     Past Medical History:  Diagnosis Date  . Anemia   . BV (bacterial vaginosis) 11/27/2012  . Celiac disease   . Depression   . Essential hypertension   . Family history of adverse reaction to anesthesia    MOM-HARD TIME WAKING UP  . GERD (gastroesophageal reflux disease)   . Migraine with visual aura    MIGRAINES  . UTI (lower  urinary tract infection)     Patient Active Problem List   Diagnosis Date Noted  . Hyperlipidemia 03/02/2019  . Left facial numbness 02/23/2019  . Urinary incontinence 02/18/2019  . Essential hypertension 02/18/2019  . Recurrent cystitis 11/27/2012  . Migraine with visual aura 09/19/2011  . Adjustment disorder with mixed anxiety and depressed mood 09/19/2011    Past Surgical History:  Procedure Laterality Date  . BLADDER SUSPENSION    . EXPLORATORY LAPAROTOMY    . IUD REMOVAL  07/25/2017   Procedure: INTRAUTERINE DEVICE (IUD) REMOVAL;  Surgeon: Harlin Heys, MD;  Location: [facility] ORS;  Service: Gynecology;;  . LAPAROSCOPIC ASSISTED VAGINAL HYSTERECTOMY Bilateral 07/25/2017   Procedure: LAPAROSCOPIC ASSISTED VAGINAL HYSTERECTOMY WITH BILATERAL [place] OOPHERECTOMY;  Surgeon: Harlin Heys, MD;  Location: [facility] ORS;  Service: Gynecology;  Laterality: Bilateral;  . TUBAL LIGATION      Prior to Admission medications   Medication Sig Start Date End Date Taking? Authorizing Provider  atorvastatin (LIPITOR) 20 MG tablet Take 1 tablet (20 mg total) by mouth every evening. For cholesterol. 03/02/19   [name], NP  Blood Pressure KIT Use arm blood pressure kit to monitor blood pressure at home 02/24/19   Jennye Boroughs, MD  butalbital-acetaminophen-caffeine (FIORICET) 50-325-40 MG tablet Take 1 tablet by mouth every 6 (six) hours as needed for headache or migraine. 03/28/19 03/27/20  Duffy Bruce, MD  escitalopram (LEXAPRO) 10 MG tablet Take 10  mg by mouth daily. 02/27/19   [provider]  hydrOXYzine (ATARAX/VISTARIL) 25 MG tablet Take 1 tablet (25 mg total) by mouth every 6 (six) hours. Patient taking differently: Take 25 mg by mouth 2 (two) times daily.  01/23/19   Bast, Tressia Miners A, NP  lisinopril (ZESTRIL) 20 MG tablet Take 1 tablet (20 mg total) by mouth daily. For blood pressure 03/02/19   [name], NP  omeprazole (PRILOSEC) 20 MG capsule TAKE 1  CAPSULE BY MOUTH EVERY DAY 11/10/18   Delia Chimes A, MD  zonisamide (ZONEGRAN) 25 MG capsule Take 25 mg by mouth 4 (four) times daily.  01/13/19   [provider]  albuterol (PROVENTIL HFA;VENTOLIN HFA) 108 (90 Base) MCG/ACT inhaler Inhale 2 puffs into the lungs every 4 (four) hours as needed for wheezing or shortness of breath (cough, shortness of breath or wheezing.). Patient not taking: Reported on 09/01/2018 07/04/18 12/14/18  Robyn Haber, MD    Allergies Topamax [topiramate], Tramadol, Gluten meal, and Ondansetron  Family History  Problem Relation Age of Onset  . Hypertension Mother   . Stroke Mother   . Diabetes Son 12       type 1  . Hypertension Maternal Grandmother   . Colon cancer Maternal Grandmother   . Breast cancer Maternal Grandmother   . Skin cancer Maternal Grandmother   . Hypertension Maternal Grandfather   . Hypertension Paternal Grandmother   . Diabetes Paternal Grandmother   . Hypertension Paternal Grandfather     Social History Social History   Tobacco Use  . Smoking status: Never Smoker  . Smokeless tobacco: Never Used  Substance Use Topics  . Alcohol use: No    Alcohol/week: 0.0 standard drinks  . Drug use: No     Review of Systems  Constitutional: No fever/chills Eyes:  No discharge ENT: No upper respiratory complaints. Respiratory: no cough. No SOB/ use of accessory muscles to breath Gastrointestinal:   No nausea, no vomiting.  No diarrhea.  No constipation. Musculoskeletal: Negative for musculoskeletal pain. Skin: Negative for rash, abrasions, lacerations, ecchymosis.   ____________________________________________   PHYSICAL EXAM:  VITAL SIGNS: ED Triage Vitals  Enc Vitals Group     BP 04/09/19 1106 125/80     Pulse Rate 04/09/19 1106 81     Resp 04/09/19 1106 16     Temp 04/09/19 1106 98.4 F (36.9 C)     Temp Source 04/09/19 1106 Oral     SpO2 04/09/19 1106 96 %     Weight 04/09/19 1101 157 lb (71.2 kg)     Height  04/09/19 1101 5' (1.524 m)     Head Circumference --      Peak Flow --      Pain Score 04/09/19 1101 0     Pain Loc --      Pain Edu? --      Excl. in [place]? --      Constitutional: Alert and oriented. Well appearing and in no acute distress. Eyes: Conjunctivae are normal. PERRL. EOMI. Head: Atraumatic. ENT:      Ears: TMs are pearly.       Nose: No congestion/rhinnorhea.      Mouth/Throat: Mucous membranes are moist.  Neck: No stridor.  No cervical spine tenderness to palpation. Cardiovascular: Normal rate, regular rhythm. Normal S1 and S2.  Good peripheral circulation. Respiratory: Normal respiratory effort without tachypnea or retractions. Lungs CTAB. Good air entry to the bases with no decreased or absent breath sounds Gastrointestinal: Bowel  sounds x 4 quadrants. Soft and nontender to palpation. No guarding or rigidity. No distention. Musculoskeletal: Full range of motion to all extremities. No obvious deformities noted Neurologic:  Normal for age. No gross focal neurologic deficits are appreciated.  Skin:  Skin is warm, dry and intact. No rash noted. Psychiatric: Mood and affect are normal for age. Speech and behavior are normal.   ____________________________________________   LABS (all labs ordered are listed, but only abnormal results are displayed)  Labs Reviewed  COMPREHENSIVE METABOLIC PANEL - Abnormal; Notable for the following components:      Result Value   Glucose, Bld 108 (*)    All other components within normal limits  URINALYSIS, COMPLETE (UACMP) WITH MICROSCOPIC - Abnormal; Notable for the following components:   Color, Urine YELLOW (*)    APPearance CLEAR (*)    All other components within normal limits  CBC WITH DIFFERENTIAL/PLATELET  TROPONIN I (HIGH SENSITIVITY)   ____________________________________________  EKG   ____________________________________________  RADIOLOGY   No results  found.  ____________________________________________    PROCEDURES  Procedure(s) performed:     Procedures     Medications - No data to display   ____________________________________________   INITIAL IMPRESSION / ASSESSMENT AND PLAN / ED COURSE  Pertinent labs & imaging results that were available during my care of the patient were reviewed by me and considered in my medical decision making (see chart for details).      Assessment and plan Fatigue 53 year old female presents to the emergency department with concern for weakness and low pulse ox reading at home.  Patient was resting comfortably in exam room.  She had no increased work of breathing.  Mucous membranes were moist and there were no signs of dehydration on exam  Differential diagnosis includes thyroid abnormality, symptomatic anemia, hypoglycemia dehydration...  Basic labs were reassuring in the emergency department.  EKG revealed normal sinus rhythm without ischemic changes.  Initial troponin was within reference range.  Splane to patient that her vital signs were reassuring in the emergency department and that she is satting at 96%.  We did have a discussion about obtaining a thyroid panel to assess for hypothyroidism as patient has struggled with chronic fatigue for several months.  She felt comfortable following up with her primary care provider.  All patient questions were answered.  ____________________________________________  FINAL CLINICAL IMPRESSION(S) / ED DIAGNOSES  Final diagnoses:  Fatigue, unspecified type      NEW MEDICATIONS STARTED DURING THIS VISIT:  ED Discharge Orders    None          This chart was dictated using voice recognition software/Dragon. Despite best efforts to proofread, errors can occur which can change the meaning. Any change was purely unintentional.     Lannie Fields, PA-C 04/09/19 1620    Lilia Pro., MD 04/09/19 [id number]

## 2019-04-25 ENCOUNTER — Ambulatory Visit (INDEPENDENT_AMBULATORY_CARE_PROVIDER_SITE_OTHER): Payer: PRIVATE HEALTH INSURANCE | Admitting: Primary Care

## 2019-04-25 ENCOUNTER — Other Ambulatory Visit: Payer: Self-pay

## 2019-04-25 DIAGNOSIS — K219 Gastro-esophageal reflux disease without esophagitis: Secondary | ICD-10-CM | POA: Diagnosis not present

## 2019-04-25 DIAGNOSIS — R05 Cough: Secondary | ICD-10-CM | POA: Diagnosis not present

## 2019-04-25 DIAGNOSIS — T464X5A Adverse effect of angiotensin-converting-enzyme inhibitors, initial encounter: Secondary | ICD-10-CM

## 2019-04-25 DIAGNOSIS — R058 Other specified cough: Secondary | ICD-10-CM | POA: Insufficient documentation

## 2019-04-25 DIAGNOSIS — I10 Essential (primary) hypertension: Secondary | ICD-10-CM

## 2019-04-25 HISTORY — DX: Adverse effect of angiotensin-converting-enzyme inhibitors, initial encounter: T46.4X5A

## 2019-04-25 HISTORY — DX: Other specified cough: R05.8

## 2019-04-25 MED ORDER — LOSARTAN POTASSIUM 50 MG PO TABS: 50.0000 mg | ORAL_TABLET | Freq: Every day | ORAL | 3 refills | Status: DC

## 2019-04-25 NOTE — Patient Instructions (Signed)
Stop taking lisinopril 20 mg for blood pressure.  Start taking losartan 50 mg once daily for blood pressure.  Keep monitoring your blood pressure and notify me if you see readings at or above 135/90 on a consistent basis.  Increase your omeprazole to 20 mg twice daily for 40 mg once daily. Please update me as discussed.  It was a pleasure to see you today! Allie Bossier, NP-C

## 2019-04-25 NOTE — Progress Notes (Signed)
Subjective:    Patient ID: [name], female    DOB: 07/15/1965, 54 y.o.   MRN: [id number]  HPI  Virtual Visit via Video Note  I connected with [name] on 04/25/19 at  3:20 PM EST by a video enabled telemedicine application and verified that I am speaking with the correct person using two identifiers.  Location: Patient: Musician Provider: Office   I discussed the limitations of evaluation and management by telemedicine and the availability of in person appointments. The patient expressed understanding and agreed to proceed.  History of Present Illness:  [name] is a 54 year old female with a medical history of anxiety and depression, essential hypertension, migraines, recurrent cystitis, hyperlipidemia, urinary incontinence who presents today for emergency department follow-up and a chief complaint of cough.  She presented to [facility] emergency department on 04/09/2019 with a chief complaint of fatigue and low oxygen saturation reading at home.  Also with chronic cough that was presumed to be secondary to lisinopril, no other acute symptoms.  During her stay in the emergency department she went underwent EKG which was without concern, pulse oxygen saturation was 96% on room air, lab work including CBC, CMP, troponin were unremarkable.  She was discharged home with recommendations for PCP follow-up, TSH given chronic fatigue. TSH completed March 19, 2019, normal.  Since discharge home from that visit she continues to experience the same cough. She's also noticed esophageal burning with reflux of gastric contents and vomiting. She describes her cough as a dry tickle to her throat that will progress into coughing spells that will persist.   She is taking omeprazole 20 mg once daily with some improvement in reflux but without resolve. She denies fevers, body aches, chills. She had one episode of diarrhea this morning, no other diarrhea episodes. Negative Covid test in November 2020,  denies known exposure.   She's checking her BP at home which is running 120's-130's/60's-70's.  BP Readings from Last 3 Encounters:  04/09/19 138/88  03/28/19 124/78  03/19/19 130/80      Observations/Objective:  Alert and oriented. Appears well, not sickly. No distress. Speaking in complete sentences. Dry cough during visit.  Assessment and Plan:  See problem based charting.  Follow Up Instructions:  Stop taking lisinopril 20 mg for blood pressure.  Start taking losartan 50 mg once daily for blood pressure.  Keep monitoring your blood pressure and notify me if you see readings at or above 135/90 on a consistent basis.  Increase your omeprazole to 20 mg twice daily for 40 mg once daily. Please update me as discussed.  It was a pleasure to see you today! Allie Bossier, NP-C     I discussed the assessment and treatment plan with the patient. The patient was provided an opportunity to ask questions and all were answered. The patient agreed with the plan and demonstrated an understanding of the instructions.   The patient was advised to call back or seek an in-person evaluation if the symptoms worsen or if the condition fails to improve as anticipated.    [name], NP    Review of Systems  Constitutional: Negative for chills and fever.  Respiratory: Positive for cough. Negative for shortness of breath.   Gastrointestinal: Negative for abdominal pain, diarrhea, nausea and vomiting.       Esophageal reflux       Past Medical History:  Diagnosis Date  . Anemia   . BV (bacterial vaginosis) 11/27/2012  . Celiac disease   .  Depression   . Essential hypertension   . Family history of adverse reaction to anesthesia    MOM-HARD TIME WAKING UP  . GERD (gastroesophageal reflux disease)   . Migraine with visual aura    MIGRAINES  . UTI (lower urinary tract infection)      Social History   Socioeconomic History  . Marital status: Married    Spouse name:  Jenny Reichmann  . Number of children: 3  . Years of education: 6  . Highest education level: Not on file  Occupational History  . Occupation: Personnel officer  Tobacco Use  . Smoking status: Never Smoker  . Smokeless tobacco: Never Used  Substance and Sexual Activity  . Alcohol use: No    Alcohol/week: 0.0 standard drinks  . Drug use: No  . Sexual activity: Yes    Partners: Female    Birth control/protection: Surgical    Comment: INTERCOURSE AGE 70, SEXUAL PARTNERS LEES THAN 5  Other Topics Concern  . Not on file  Social History Narrative   Lives with her husband and their three children, and her daughter's boyfriend.  Her older son's daughter lives there part-time as well.   Social Determinants of Health   Financial Resource Strain:   . Difficulty of Paying Living Expenses: Not on file  Food Insecurity:   . Worried About Charity fundraiser in the Last Year: Not on file  . Ran Out of Food in the Last Year: Not on file  Transportation Needs:   . Lack of Transportation (Medical): Not on file  . Lack of Transportation (Non-Medical): Not on file  Physical Activity:   . Days of Exercise per Week: Not on file  . Minutes of Exercise per Session: Not on file  Stress:   . Feeling of Stress : Not on file  Social Connections:   . Frequency of Communication with Friends and Family: Not on file  . Frequency of Social Gatherings with Friends and Family: Not on file  . Attends Religious Services: Not on file  . Active Member of Clubs or Organizations: Not on file  . Attends Archivist Meetings: Not on file  . Marital Status: Not on file  Intimate Partner Violence:   . Fear of Current or Ex-Partner: Not on file  . Emotionally Abused: Not on file  . Physically Abused: Not on file  . Sexually Abused: Not on file    Past Surgical History:  Procedure Laterality Date  . BLADDER SUSPENSION    . EXPLORATORY LAPAROTOMY    . IUD REMOVAL  07/25/2017   Procedure:  INTRAUTERINE DEVICE (IUD) REMOVAL;  Surgeon: Harlin Heys, MD;  Location: [facility] ORS;  Service: Gynecology;;  . LAPAROSCOPIC ASSISTED VAGINAL HYSTERECTOMY Bilateral 07/25/2017   Procedure: LAPAROSCOPIC ASSISTED VAGINAL HYSTERECTOMY WITH BILATERAL [place] OOPHERECTOMY;  Surgeon: Harlin Heys, MD;  Location: [facility] ORS;  Service: Gynecology;  Laterality: Bilateral;  . TUBAL LIGATION      Family History  Problem Relation Age of Onset  . Hypertension Mother   . Stroke Mother   . Diabetes Son 12       type 1  . Hypertension Maternal Grandmother   . Colon cancer Maternal Grandmother   . Breast cancer Maternal Grandmother   . Skin cancer Maternal Grandmother   . Hypertension Maternal Grandfather   . Hypertension Paternal Grandmother   . Diabetes Paternal Grandmother   . Hypertension Paternal Grandfather     Allergies  Allergen Reactions  . Topamax [  Topiramate]     "cant function"   . Tramadol     Cant function with it   . Gluten Meal Diarrhea and Nausea Only       . Ondansetron Other (See Comments)    Helps her nausea, but makes HA worse    Current Outpatient Medications on File Prior to Visit  Medication Sig Dispense Refill  . atorvastatin (LIPITOR) 20 MG tablet Take 1 tablet (20 mg total) by mouth every evening. For cholesterol. 90 tablet 3  . Blood Pressure KIT Use arm blood pressure kit to monitor blood pressure at home 1 kit 0  . butalbital-acetaminophen-caffeine (FIORICET) 50-325-40 MG tablet Take 1 tablet by mouth every 6 (six) hours as needed for headache or migraine. 12 tablet 0  . escitalopram (LEXAPRO) 10 MG tablet Take 10 mg by mouth daily.    . hydrOXYzine (ATARAX/VISTARIL) 25 MG tablet Take 1 tablet (25 mg total) by mouth every 6 (six) hours. (Patient taking differently: Take 25 mg by mouth 2 (two) times daily. ) 12 tablet 0  . omeprazole (PRILOSEC) 20 MG capsule TAKE 1 CAPSULE BY MOUTH EVERY DAY 90 capsule 1  . zonisamide (ZONEGRAN) 25 MG capsule Take 25 mg  by mouth 4 (four) times daily.     . [DISCONTINUED] albuterol (PROVENTIL HFA;VENTOLIN HFA) 108 (90 Base) MCG/ACT inhaler Inhale 2 puffs into the lungs every 4 (four) hours as needed for wheezing or shortness of breath (cough, shortness of breath or wheezing.). (Patient not taking: Reported on 09/01/2018) 1 Inhaler 1   No current facility-administered medications on file prior to visit.    LMP  (LMP Unknown)    Objective:   Physical Exam  Constitutional: She is oriented to person, place, and time. She appears well-nourished.  Respiratory: Effort normal.  Dry cough during exam, occurred once  Neurological: She is alert and oriented to person, place, and time.  Psychiatric: She has a normal mood and affect.           Assessment & Plan:

## 2019-04-25 NOTE — Assessment & Plan Note (Signed)
Recurrent reflux with breakthrough symptoms on omeprazole 20 mg. Suspect this could also be contributing to cough.   Increase dose of omeprazole to 20 mg BID, she will update.

## 2019-04-25 NOTE — Assessment & Plan Note (Signed)
Recurrent cough is likely ACE induced.  Stop lisinopril, start losartan 50 mg. New Rx sent to pharmacy, she will monitor BP.

## 2019-04-25 NOTE — Assessment & Plan Note (Signed)
Likely ACE induced. Stop lisinopril. Start losartan. She will update.  Hospital notes, labs reviewed from April 09, 2019.

## 2019-04-26 ENCOUNTER — Ambulatory Visit (HOSPITAL_COMMUNITY)
Admission: EM | Admit: 2019-04-26 | Discharge: 2019-04-26 | Disposition: A | Discharge status: Home / Self Care | Payer: PRIVATE HEALTH INSURANCE | Attending: Family Medicine | Admitting: Family Medicine

## 2019-04-26 ENCOUNTER — Other Ambulatory Visit: Payer: Self-pay

## 2019-04-26 ENCOUNTER — Encounter (HOSPITAL_COMMUNITY): Payer: Self-pay | Admitting: Emergency Medicine

## 2019-04-26 DIAGNOSIS — M5442 Lumbago with sciatica, left side: Secondary | ICD-10-CM

## 2019-04-26 LAB — POCT URINALYSIS DIP (DEVICE)
Bilirubin Urine: NEGATIVE
Glucose, UA: NEGATIVE mg/dL
Hgb urine dipstick: NEGATIVE
Ketones, ur: NEGATIVE mg/dL
Leukocytes,Ua: NEGATIVE
Nitrite: NEGATIVE
Protein, ur: NEGATIVE mg/dL
Specific Gravity, Urine: 1.025 (ref 1.005–1.030)
Urobilinogen, UA: 0.2 mg/dL (ref 0.0–1.0)
pH: 7 (ref 5.0–8.0)

## 2019-04-26 MED ORDER — TIZANIDINE HCL 4 MG PO TABS: 4.0000 mg | ORAL_TABLET | Freq: Four times a day (QID) | ORAL | 0 refills | Status: DC | PRN

## 2019-04-26 MED ORDER — HYDROCODONE-ACETAMINOPHEN 5-325 MG PO TABS: 1.0000 | ORAL_TABLET | Freq: Four times a day (QID) | ORAL | 0 refills | Status: DC | PRN

## 2019-04-26 MED ORDER — PREDNISONE 20 MG PO TABS: 20.0000 mg | ORAL_TABLET | Freq: Two times a day (BID) | ORAL | 0 refills | Status: DC

## 2019-04-26 NOTE — Discharge Instructions (Signed)
Take prednisone 2 times a day for 5 days Take 2 doses today Take the muscle relaxer tizanidine as needed.  This is useful at bedtime Take pain medicine if pain is severe.  Caution drowsiness.  Do not take pain medicine and drive Call your PCP if not improved by Monday

## 2019-04-26 NOTE — ED Triage Notes (Signed)
Pt reports left lower back pain that radiates into her left buttocks and hip and into her left upper leg.  She states this started about 10 days ago.  Pt denies any urinary symptoms.

## 2019-04-26 NOTE — ED Provider Notes (Signed)
[place]    CSN: [id number] Arrival date & time: 04/26/19  9470      History   Chief Complaint Chief Complaint  Patient presents with  . Back Pain    left lower radiating to hip and leg    HPI [name] is a 54 y.o. female.   HPI  Patient is here for low back pain                 Been present for about a week.  For the last couple of days she has had pain intermittently down the back of her left leg from the buttock to the back of the knee.  No numbness.  No weakness.  No bowel or bladder complaint.  No fall or injury.  No overuse.  She has multiple medical problems and is generally compliant with her medical care.  She recently had her GERD medicine increased because of GERD symptoms.  She states that she does not take NSAID medicines for this reason.  So far this week She had a telemedicine visit with cardiology on 1/11, and neurology telemedicine visit on 1/12, and office visit with her family medicine doctor on 1/13, and an office visit here today.  She did not mention back pain on any of these visits.  Past Medical History:  Diagnosis Date  . Anemia   . BV (bacterial vaginosis) 11/27/2012  . Celiac disease   . Depression   . Essential hypertension   . Family history of adverse reaction to anesthesia    MOM-HARD TIME WAKING UP  . GERD (gastroesophageal reflux disease)   . Migraine with visual aura    MIGRAINES  . UTI (lower urinary tract infection)     Patient Active Problem List   Diagnosis Date Noted  . GERD (gastroesophageal reflux disease) 04/25/2019  . Cough due to ACE inhibitor 04/25/2019  . Hyperlipidemia 03/02/2019  . Left facial numbness 02/23/2019  . Urinary incontinence 02/18/2019  . Essential hypertension 02/18/2019  . Recurrent cystitis 11/27/2012  . Migraine with visual aura 09/19/2011  . Adjustment disorder with mixed anxiety and depressed mood 09/19/2011    Past Surgical History:  Procedure Laterality Date  . BLADDER SUSPENSION     . EXPLORATORY LAPAROTOMY    . IUD REMOVAL  07/25/2017   Procedure: INTRAUTERINE DEVICE (IUD) REMOVAL;  Surgeon: Harlin Heys, MD;  Location: [facility] ORS;  Service: Gynecology;;  . LAPAROSCOPIC ASSISTED VAGINAL HYSTERECTOMY Bilateral 07/25/2017   Procedure: LAPAROSCOPIC ASSISTED VAGINAL HYSTERECTOMY WITH BILATERAL [place] OOPHERECTOMY;  Surgeon: Harlin Heys, MD;  Location: [facility] ORS;  Service: Gynecology;  Laterality: Bilateral;  . TUBAL LIGATION      OB History    Gravida  3   Para  3   Term  3   Preterm      AB      Living  3     SAB      TAB      Ectopic      Multiple      Live Births  3            Home Medications    Prior to Admission medications   Medication Sig Start Date End Date Taking? Authorizing Provider  atorvastatin (LIPITOR) 20 MG tablet Take 1 tablet (20 mg total) by mouth every evening. For cholesterol. 03/02/19  Yes [name], NP  escitalopram (LEXAPRO) 10 MG tablet Take 10 mg by mouth daily. 02/27/19  Yes [provider]  hydrOXYzine (ATARAX/VISTARIL) 25 MG tablet Take 1 tablet (25 mg total) by mouth every 6 (six) hours. Patient taking differently: Take 25 mg by mouth 2 (two) times daily.  01/23/19  Yes Bast, Traci A, NP  losartan (COZAAR) 50 MG tablet Take 1 tablet (50 mg total) by mouth daily. For blood pressure. 04/25/19  Yes [name], NP  omeprazole (PRILOSEC) 20 MG capsule TAKE 1 CAPSULE BY MOUTH EVERY DAY 11/10/18  Yes Delia Chimes A, MD  zonisamide (ZONEGRAN) 25 MG capsule Take 25 mg by mouth 4 (four) times daily.  01/13/19  Yes [provider]  butalbital-acetaminophen-caffeine (FIORICET) 50-325-40 MG tablet Take 1 tablet by mouth every 6 (six) hours as needed for headache or migraine. 03/28/19 03/27/20  Duffy Bruce, MD  HYDROcodone-acetaminophen (NORCO/VICODIN) 5-325 MG tablet Take 1-2 tablets by mouth every 6 (six) hours as needed. 04/26/19   Raylene Everts, MD  predniSONE (DELTASONE)  20 MG tablet Take 1 tablet (20 mg total) by mouth 2 (two) times daily with a meal. 04/26/19   Raylene Everts, MD  tiZANidine (ZANAFLEX) 4 MG tablet Take 1-2 tablets (4-8 mg total) by mouth every 6 (six) hours as needed for muscle spasms. 04/26/19   Raylene Everts, MD  albuterol (PROVENTIL HFA;VENTOLIN HFA) 108 (90 Base) MCG/ACT inhaler Inhale 2 puffs into the lungs every 4 (four) hours as needed for wheezing or shortness of breath (cough, shortness of breath or wheezing.). Patient not taking: Reported on 09/01/2018 07/04/18 12/14/18  Robyn Haber, MD    Family History Family History  Problem Relation Age of Onset  . Hypertension Mother   . Stroke Mother   . Diabetes Son 12       type 1  . Hypertension Maternal Grandmother   . Colon cancer Maternal Grandmother   . Breast cancer Maternal Grandmother   . Skin cancer Maternal Grandmother   . Hypertension Maternal Grandfather   . Hypertension Paternal Grandmother   . Diabetes Paternal Grandmother   . Hypertension Paternal Grandfather     Social History Social History   Tobacco Use  . Smoking status: Never Smoker  . Smokeless tobacco: Never Used  Substance Use Topics  . Alcohol use: No    Alcohol/week: 0.0 standard drinks  . Drug use: No     Allergies   Topamax [topiramate], Tramadol, Gluten meal, and Ondansetron   Review of Systems Review of Systems  Constitutional: Negative for chills and fever.  HENT: Negative for congestion and hearing loss.   Respiratory: Negative for cough and shortness of breath.   Cardiovascular: Negative for chest pain and leg swelling.  Gastrointestinal: Negative for diarrhea, nausea and vomiting.  Genitourinary: Negative for dysuria and frequency.  Musculoskeletal: Positive for back pain. Negative for myalgias.  Neurological: Positive for seizures. Negative for dizziness and headaches.  Psychiatric/Behavioral: Positive for sleep disturbance.     Physical Exam Triage Vital Signs ED  Triage Vitals [04/26/19 0945]  Enc Vitals Group     BP 121/69     Pulse Rate 85     Resp (!) 24     Temp 98.3 F (36.8 C)     Temp Source Oral     SpO2 96 %     Weight      Height      Head Circumference      Peak Flow      Pain Score 9     Pain Loc      Pain Edu?  Excl. in [place]?    No data found.  Updated Vital Signs BP 121/69 (BP Location: Right Arm)   Pulse 85   Temp 98.3 F (36.8 C) (Oral)   Resp (!) 24   LMP  (LMP Unknown)   SpO2 96%      Physical Exam Constitutional:      General: She is not in acute distress.    Appearance: She is well-developed and normal weight.     Comments: No acute distress.  HENT:     Head: Normocephalic and atraumatic.     Mouth/Throat:     Comments: Mask is in place Eyes:     Conjunctiva/sclera: Conjunctivae normal.     Pupils: Pupils are equal, round, and reactive to light.  Cardiovascular:     Rate and Rhythm: Normal rate.  Pulmonary:     Effort: Pulmonary effort is normal. No respiratory distress.  Abdominal:     Tenderness: There is no right CVA tenderness or left CVA tenderness.  Musculoskeletal:        General: Normal range of motion.     Cervical back: Normal range of motion.     Comments: Tenderness at the left SI joint.  Strength sensation range of motion reflexes normal in lower extremities.  Straight leg raise is negative bilaterally  Skin:    General: Skin is warm and dry.  Neurological:     Mental Status: She is alert.  Psychiatric:        Behavior: Behavior normal.      UC Treatments / Results  Labs (all labs ordered are listed, but only abnormal results are displayed) Labs Reviewed  POCT URINALYSIS DIP (DEVICE)    EKG   Radiology No results found.  Procedures Procedures (including critical care time)  Medications Ordered in UC Medications - No data to display  Initial Impression / Assessment and Plan / UC Course  I have reviewed the triage vital signs and the nursing notes.  Pertinent  labs & imaging results that were available during my care of the patient were reviewed by me and considered in my medical decision making (see chart for details).     Giving her a moderate dose of prednisone for 5 days instead of a Dosepak.  I am concerned with her seizure disorder giving her high-dose steroids.  I am giving her Vicodin for pain.  As needed muscle relaxer.  Follow-up with PCP. Final Clinical Impressions(s) / UC Diagnoses   Final diagnoses:  Acute left-sided low back pain with left-sided sciatica     Discharge Instructions     Take prednisone 2 times a day for 5 days Take 2 doses today Take the muscle relaxer tizanidine as needed.  This is useful at bedtime Take pain medicine if pain is severe.  Caution drowsiness.  Do not take pain medicine and drive Call your PCP if not improved by Monday   ED Prescriptions    Medication Sig Dispense Auth. Provider   predniSONE (DELTASONE) 20 MG tablet Take 1 tablet (20 mg total) by mouth 2 (two) times daily with a meal. 10 tablet Raylene Everts, MD   tiZANidine (ZANAFLEX) 4 MG tablet Take 1-2 tablets (4-8 mg total) by mouth every 6 (six) hours as needed for muscle spasms. 21 tablet Raylene Everts, MD   HYDROcodone-acetaminophen (NORCO/VICODIN) 5-325 MG tablet Take 1-2 tablets by mouth every 6 (six) hours as needed. 10 tablet Raylene Everts, MD     I have reviewed the [place] during  this encounter.   Raylene Everts, MD 04/26/19 1539

## 2019-04-30 ENCOUNTER — Ambulatory Visit (INDEPENDENT_AMBULATORY_CARE_PROVIDER_SITE_OTHER): Payer: PRIVATE HEALTH INSURANCE | Admitting: Urology

## 2019-04-30 ENCOUNTER — Other Ambulatory Visit: Payer: Self-pay

## 2019-04-30 ENCOUNTER — Encounter: Payer: Self-pay | Admitting: Urology

## 2019-04-30 VITALS — BP 131/88 | HR 94 | Ht 60.0 in | Wt 163.0 lb

## 2019-04-30 DIAGNOSIS — R399 Unspecified symptoms and signs involving the genitourinary system: Secondary | ICD-10-CM | POA: Diagnosis not present

## 2019-04-30 DIAGNOSIS — N3090 Cystitis, unspecified without hematuria: Secondary | ICD-10-CM

## 2019-04-30 DIAGNOSIS — N3020 Other chronic cystitis without hematuria: Secondary | ICD-10-CM

## 2019-04-30 LAB — URINALYSIS, COMPLETE
Bilirubin, UA: NEGATIVE
Glucose, UA: NEGATIVE
Ketones, UA: NEGATIVE
Leukocytes,UA: NEGATIVE
Nitrite, UA: NEGATIVE
Protein,UA: NEGATIVE
RBC, UA: NEGATIVE
Specific Gravity, UA: 1.015 (ref 1.005–1.030)
Urobilinogen, Ur: 0.2 mg/dL (ref 0.2–1.0)
pH, UA: 6.5 (ref 5.0–7.5)

## 2019-04-30 LAB — MICROSCOPIC EXAMINATION: RBC, Urine: NONE SEEN /hpf (ref 0–2)

## 2019-04-30 MED ORDER — TRIMETHOPRIM 100 MG PO TABS: 100.0000 mg | ORAL_TABLET | Freq: Every day | ORAL | 11 refills | Status: DC

## 2019-04-30 NOTE — Progress Notes (Signed)
04/30/2019 9:35 AM   Arma Heading 1965/12/24 [id number]  Referring provider: [name], NP [place],  [place] [address]  Chief Complaint  Patient presents with  . Urinary Incontinence    New Patient    HPI: I was consulted to assess the patient is recurrent bladder infections.  She thinks she gets a least 10/year.  She gets foul odor and burning but no increased frequency.  Symptoms temporarily respond to antibiotics.  She says she is infected most of the time  She normally voids every 1 or 2 hours gets up at least 2-3 times at night.  She can sometimes leak with exercise and jumping and sometimes with urgency.  She has rare bedwetting.  She does not wear a pad but she thinks she should  She had a bladder sling many years ago and has had a hysterectomy and perhaps 1 kidney stone  No neurologic issues.  Prone to constipation     PMH: Past Medical History:  Diagnosis Date  . Anemia   . BV (bacterial vaginosis) 11/27/2012  . Celiac disease   . Depression   . Essential hypertension   . Family history of adverse reaction to anesthesia    MOM-HARD TIME WAKING UP  . GERD (gastroesophageal reflux disease)   . Migraine with visual aura    MIGRAINES  . UTI (lower urinary tract infection)     Surgical History: Past Surgical History:  Procedure Laterality Date  . BLADDER SUSPENSION    . EXPLORATORY LAPAROTOMY    . IUD REMOVAL  07/25/2017   Procedure: INTRAUTERINE DEVICE (IUD) REMOVAL;  Surgeon: Harlin Heys, MD;  Location: [facility] ORS;  Service: Gynecology;;  . LAPAROSCOPIC ASSISTED VAGINAL HYSTERECTOMY Bilateral 07/25/2017   Procedure: LAPAROSCOPIC ASSISTED VAGINAL HYSTERECTOMY WITH BILATERAL [place] OOPHERECTOMY;  Surgeon: Harlin Heys, MD;  Location: [facility] ORS;  Service: Gynecology;  Laterality: Bilateral;  . TUBAL LIGATION      Home Medications:  Allergies as of 04/30/2019      Reactions   Topamax [topiramate]    "cant function"    Tramadol    Cant function with it    Gluten Meal Diarrhea, Nausea Only      Ondansetron Other (See Comments)   Helps her nausea, but makes HA worse      Medication List       Accurate as of April 30, 2019  9:35 AM. If you have any questions, ask your nurse or doctor.        atorvastatin 20 MG tablet Commonly known as: LIPITOR Take 1 tablet (20 mg total) by mouth every evening. For cholesterol.   butalbital-acetaminophen-caffeine 50-325-40 MG tablet Commonly known as: FIORICET Take 1 tablet by mouth every 6 (six) hours as needed for headache or migraine.   escitalopram 10 MG tablet Commonly known as: LEXAPRO Take 10 mg by mouth daily.   HYDROcodone-acetaminophen 5-325 MG tablet Commonly known as: NORCO/VICODIN Take 1-2 tablets by mouth every 6 (six) hours as needed.   hydrOXYzine 25 MG tablet Commonly known as: ATARAX/VISTARIL Take 1 tablet (25 mg total) by mouth every 6 (six) hours. What changed: when to take this   losartan 50 MG tablet Commonly known as: COZAAR Take 1 tablet (50 mg total) by mouth daily. For blood pressure.   omeprazole 20 MG capsule Commonly known as: PRILOSEC TAKE 1 CAPSULE BY MOUTH EVERY DAY   predniSONE 20 MG tablet Commonly known as: Deltasone Take 1 tablet (20 mg total) by  mouth 2 (two) times daily with a meal.   tiZANidine 4 MG tablet Commonly known as: Zanaflex Take 1-2 tablets (4-8 mg total) by mouth every 6 (six) hours as needed for muscle spasms.   zonisamide 25 MG capsule Commonly known as: ZONEGRAN Take 25 mg by mouth 4 (four) times daily.       Allergies:  Allergies  Allergen Reactions  . Topamax [Topiramate]     "cant function"   . Tramadol     Cant function with it   . Gluten Meal Diarrhea and Nausea Only       . Ondansetron Other (See Comments)    Helps her nausea, but makes HA worse    Family History: Family History  Problem Relation Age of Onset  . Hypertension Mother   . Stroke Mother   . Diabetes  Son 12       type 1  . Hypertension Maternal Grandmother   . Colon cancer Maternal Grandmother   . Breast cancer Maternal Grandmother   . Skin cancer Maternal Grandmother   . Hypertension Maternal Grandfather   . Hypertension Paternal Grandmother   . Diabetes Paternal Grandmother   . Hypertension Paternal Grandfather     Social History:  reports that she has never smoked. She has never used smokeless tobacco. She reports that she does not drink alcohol or use drugs.  ROS: UROLOGY Frequent Urination?: Yes Hard to postpone urination?: No Burning/pain with urination?: No Get up at night to urinate?: No Leakage of urine?: Yes Urine stream starts and stops?: No Trouble starting stream?: No Do you have to strain to urinate?: No Blood in urine?: No Urinary tract infection?: Yes Sexually transmitted disease?: No Injury to kidneys or bladder?: No Painful intercourse?: No Weak stream?: No Currently pregnant?: No Vaginal bleeding?: No Last menstrual period?: n  Gastrointestinal Nausea?: Yes Vomiting?: No Indigestion/heartburn?: Yes Diarrhea?: No Constipation?: Yes  Constitutional Fever: No Night sweats?: No Weight loss?: No Fatigue?: No  Skin Skin rash/lesions?: No Itching?: No  Eyes Blurred vision?: No Double vision?: No  Ears/Nose/Throat Sore throat?: No Sinus problems?: No  Hematologic/Lymphatic Swollen glands?: No Easy bruising?: No  Cardiovascular Leg swelling?: No Chest pain?: No  Respiratory Cough?: Yes Shortness of breath?: Yes  Endocrine Excessive thirst?: No  Musculoskeletal Back pain?: No Joint pain?: No  Neurological Headaches?: Yes Dizziness?: No  Psychologic Depression?: Yes Anxiety?: Yes  Physical Exam: BP 131/88   Pulse 94   Ht 5' (1.524 m)   Wt 73.9 kg   LMP  (LMP Unknown)   BMI 31.83 kg/m   Constitutional:  Alert and oriented, No acute distress. HEENT: [place] AT, moist mucus membranes.  Trachea midline, no  masses. Cardiovascular: No clubbing, cyanosis, or edema. Respiratory: Normal respiratory effort, no increased work of breathing. GI: Abdomen is soft, nontender, nondistended, no abdominal masses GU: No CVA tenderness.  No bladder tenderness Skin: No rashes, bruises or suspicious lesions. Lymph: No cervical or inguinal adenopathy. Neurologic: Grossly intact, no focal deficits, moving all 4 extremities. Psychiatric: Normal mood and affect.  Laboratory Data: Lab Results  Component Value Date   WBC 4.4 04/09/2019   HGB 14.1 04/09/2019   HCT 41.9 04/09/2019   MCV 95.9 04/09/2019   PLT 238 04/09/2019    Lab Results  Component Value Date   CREATININE 0.55 04/09/2019    No results found for: PSA  No results found for: TESTOSTERONE  Lab Results  Component Value Date   HGBA1C 5.4 02/24/2019    Urinalysis  Component Value Date/Time   COLORURINE YELLOW (A) 04/09/2019 1110   APPEARANCEUR CLEAR (A) 04/09/2019 1110   LABSPEC 1.025 04/26/2019 0959   PHURINE 7.0 04/26/2019 0959   GLUCOSEU NEGATIVE 04/26/2019 0959   HGBUR NEGATIVE 04/26/2019 0959   BILIRUBINUR NEGATIVE 04/26/2019 0959   BILIRUBINUR Negative 02/16/2019 1122   KETONESUR NEGATIVE 04/26/2019 0959   PROTEINUR NEGATIVE 04/26/2019 0959   UROBILINOGEN 0.2 04/26/2019 0959   NITRITE NEGATIVE 04/26/2019 0959   LEUKOCYTESUR NEGATIVE 04/26/2019 0959    Pertinent Imaging: Urinalysis reviewed.  Urine sent for culture.  Chart reviewed and no previous x-ray  Assessment & Plan: Pathophysiology of chronic cystitis discussed.  She has frequency and nocturia and mild incontinence that could down regulate on prophylaxis.  Call if urine culture positive.  See in about 6 weeks for pelvic examination and cystoscopy on trimethoprim.  Call if renal ultrasound is abnormal  1. Recurrent cystitis  - Urinalysis, Complete   No follow-ups on file.  Reece Packer, MD  [facility] [address],  [place], [place] [address] [phone]

## 2019-05-02 ENCOUNTER — Telehealth: Payer: Self-pay | Admitting: Urology

## 2019-05-02 DIAGNOSIS — N3090 Cystitis, unspecified without hematuria: Secondary | ICD-10-CM

## 2019-05-02 LAB — CULTURE, URINE COMPREHENSIVE

## 2019-05-02 NOTE — Telephone Encounter (Signed)
Pt said Dr Matilde Sprang put her on a once a day antibiotic and pt felt like she was about to pass out.  Pt's face was flushed.  She was wondering if she might be having side effects from the meds.

## 2019-05-02 NOTE — Telephone Encounter (Signed)
Called pt who states she believes she had a reaction to trimethoprim. Pt states she became flushed, and felt she would pass out. Advised pt that these are not the most common side effects of the medication however she should discontinue the medication. Please advise on alternative daily suppressant.

## 2019-05-03 MED ORDER — NITROFURANTOIN MACROCRYSTAL 100 MG PO CAPS: 100.0000 mg | ORAL_CAPSULE | Freq: Every day | ORAL | 11 refills | Status: DC

## 2019-05-03 NOTE — Telephone Encounter (Signed)
Called pt informed her of the information below. RX sent.

## 2019-05-03 NOTE — Telephone Encounter (Signed)
Stop triprim Can start daily macrodantin 100 mg #30 x 11

## 2019-05-08 ENCOUNTER — Other Ambulatory Visit: Payer: Self-pay | Admitting: Family Medicine

## 2019-05-29 ENCOUNTER — Ambulatory Visit: Payer: PRIVATE HEALTH INSURANCE | Attending: Urology

## 2019-06-21 ENCOUNTER — Ambulatory Visit: Admission: RE | Admit: 2019-06-21 | Payer: PRIVATE HEALTH INSURANCE | Source: Ambulatory Visit

## 2019-06-25 ENCOUNTER — Encounter: Payer: Self-pay | Admitting: Urology

## 2019-06-25 ENCOUNTER — Other Ambulatory Visit: Payer: Self-pay | Admitting: Urology

## 2019-07-17 ENCOUNTER — Ambulatory Visit: Payer: PRIVATE HEALTH INSURANCE | Admitting: Primary Care

## 2019-07-17 DIAGNOSIS — Z0289 Encounter for other administrative examinations: Secondary | ICD-10-CM

## 2019-07-18 ENCOUNTER — Ambulatory Visit: Payer: PRIVATE HEALTH INSURANCE | Attending: Urology

## 2019-08-03 ENCOUNTER — Telehealth: Payer: Self-pay | Admitting: Primary Care

## 2019-08-03 NOTE — Telephone Encounter (Signed)
Received disability paperwork. Left message for patient to call the office.

## 2019-08-08 NOTE — Telephone Encounter (Signed)
Patient's FMLA was last filled out by Dr. Carlota Raspberry. Patient will need to schedule appointment with Anda Kraft.

## 2019-08-29 ENCOUNTER — Other Ambulatory Visit: Payer: Self-pay

## 2019-08-29 ENCOUNTER — Ambulatory Visit: Payer: PRIVATE HEALTH INSURANCE | Admitting: Primary Care

## 2019-08-29 ENCOUNTER — Encounter: Payer: Self-pay | Admitting: Primary Care

## 2019-08-29 VITALS — BP 116/74 | HR 87 | Temp 96.0°F | Ht 60.0 in | Wt 163.5 lb

## 2019-08-29 DIAGNOSIS — F4323 Adjustment disorder with mixed anxiety and depressed mood: Secondary | ICD-10-CM

## 2019-08-29 DIAGNOSIS — I10 Essential (primary) hypertension: Secondary | ICD-10-CM | POA: Diagnosis not present

## 2019-08-29 DIAGNOSIS — N3090 Cystitis, unspecified without hematuria: Secondary | ICD-10-CM

## 2019-08-29 DIAGNOSIS — E785 Hyperlipidemia, unspecified: Secondary | ICD-10-CM | POA: Diagnosis not present

## 2019-08-29 DIAGNOSIS — G43109 Migraine with aura, not intractable, without status migrainosus: Secondary | ICD-10-CM | POA: Diagnosis not present

## 2019-08-29 DIAGNOSIS — K219 Gastro-esophageal reflux disease without esophagitis: Secondary | ICD-10-CM

## 2019-08-29 LAB — COMPREHENSIVE METABOLIC PANEL
ALT: 17 U/L (ref 0–35)
AST: 17 U/L (ref 0–37)
Albumin: 3.8 g/dL (ref 3.5–5.2)
Alkaline Phosphatase: 109 U/L (ref 39–117)
BUN: 19 mg/dL (ref 6–23)
CO2: 29 mEq/L (ref 19–32)
Calcium: 9 mg/dL (ref 8.4–10.5)
Chloride: 104 mEq/L (ref 96–112)
Creatinine, Ser: 0.78 mg/dL (ref 0.40–1.20)
GFR: 77.31 mL/min (ref >60.00)
Glucose, Bld: 88 mg/dL (ref 70–99)
Potassium: 3.9 mEq/L (ref 3.5–5.1)
Sodium: 138 mEq/L (ref 135–145)
Total Bilirubin: 0.3 mg/dL (ref 0.2–1.2)
Total Protein: 6.2 g/dL (ref 6.0–8.3)

## 2019-08-29 LAB — LIPID PANEL
Cholesterol: 120 mg/dL (ref 0–200)
HDL: 35.7 mg/dL — ABNORMAL LOW (ref >39.00)
LDL Cholesterol: 65 mg/dL (ref 0–99)
NonHDL: 84.47
Total CHOL/HDL Ratio: 3
Triglycerides: 96 mg/dL (ref 0.0–149.0)
VLDL: 19.2 mg/dL (ref 0.0–40.0)

## 2019-08-29 NOTE — Assessment & Plan Note (Signed)
Compliant to atorvastatin, repeat lipids pending,

## 2019-08-29 NOTE — Assessment & Plan Note (Signed)
Following with Neurology, doing better with Emalgatiy injections. Continue same.

## 2019-08-29 NOTE — Assessment & Plan Note (Signed)
Following with Urology, compliant to daily Macrodantin. Continue same.

## 2019-08-29 NOTE — Assessment & Plan Note (Signed)
Well controlled in the office today on losartan, no longer coughing since ACE-I was removed. CMP pending.

## 2019-08-29 NOTE — Assessment & Plan Note (Signed)
Following with psychiatry and therapy.  Today she is needing a disability claim signed to excuse her from working for the next 2-3 months due to anxiety/panic attacks.  She is very scattered in her thoughts today, doesn't appear ready to return to work. I agreed to provide her with a maximum of three months excuse, she verbalized understanding.

## 2019-08-29 NOTE — Assessment & Plan Note (Signed)
Doing well on PPI, continue same.

## 2019-08-29 NOTE — Patient Instructions (Signed)
Stop by the lab prior to leaving today. I will notify you of your results once received.   Please come pick up your form tomorrow.  It was a pleasure to see you today!

## 2019-08-29 NOTE — Progress Notes (Signed)
Subjective:    Patient ID: [name], female    DOB: [birth year], 54 y.o.   MRN: [id number]  HPI  This visit occurred during the SARS-CoV-2 public health emergency.  Safety protocols were in place, including screening questions prior to the visit, additional usage of staff PPE, and extensive cleaning of exam room while observing appropriate contact time as indicated for disinfecting solutions.   MS. Girten is a 54 year old female with a history of hypertension, migraines, GERD, recurrent cystitis, anxiety and depression, hyperlipidemia who presents today for follow up and requests for [facility] paperwork.  Following with [facility] for migraines, currently managed on emgality injections every 28 days. Last visit in April 2021.   Following with [facility] for recurrent cystitis and urinary incontinence, one and only visit was in January 2021. During that visit it was recommended she return 6 weeks later for pelvic exam and cystoscopy on trimethoprim. Renal ultrasound pending, but she never completed. She had a reaction on trimethoprim so she was changed to Macrodantin daily. She missed her most recent appointment.   She was last evaluated by me in January 2021, endorsed chronic cough, was managed on ACE-I, we switched her to losartan 50 mg.   She is here today with a form from [employer] with a disability claim form. She and her husband took out a loan and she is needing proof that she has not been able to work. She has been out of work since July or August of 2020 due to anxiety attacks and paranoia. Today she endorses that she has trouble working due to her anxiety/panic attacks, "breaking down", having to leave work often.   Dr. Carlota Raspberry completed her last form which is scanned into her chart, he provided her with excuse from April 2020 to May 2020. She is currently following with psychiatry and therapy through [facility].     BP Readings from Last 3 Encounters:  08/29/19  116/74  04/30/19 131/88  04/26/19 121/69     Review of Systems  Respiratory: Negative for shortness of breath.   Cardiovascular: Negative for chest pain.  Neurological: Negative for dizziness.  Psychiatric/Behavioral:       See HPI       Past Medical History:  Diagnosis Date  . Anemia   . BV (bacterial vaginosis) 11/27/2012  . Celiac disease   . Depression   . Essential hypertension   . Family history of adverse reaction to anesthesia    MOM-HARD TIME WAKING UP  . GERD (gastroesophageal reflux disease)   . Migraine with visual aura    MIGRAINES  . UTI (lower urinary tract infection)      Social History   Socioeconomic History  . Marital status: Married    Spouse name: Jenny Reichmann  . Number of children: 3  . Years of education: 24  . Highest education level: Not on file  Occupational History  . Occupation: Personnel officer  Tobacco Use  . Smoking status: Never Smoker  . Smokeless tobacco: Never Used  Substance and Sexual Activity  . Alcohol use: No    Alcohol/week: 0.0 standard drinks  . Drug use: No  . Sexual activity: Yes    Partners: Female    Birth control/protection: Surgical    Comment: INTERCOURSE AGE 83, SEXUAL PARTNERS LEES THAN 5  Other Topics Concern  . Not on file  Social History Narrative   Lives with her husband and their three children, and her daughter's boyfriend.  Her  older son's daughter lives there part-time as well.   Social Determinants of Health   Financial Resource Strain:   . Difficulty of Paying Living Expenses:   Food Insecurity:   . Worried About Charity fundraiser in the Last Year:   . Arboriculturist in the Last Year:   Transportation Needs:   . Film/video editor (Medical):   Marland Kitchen Lack of Transportation (Non-Medical):   Physical Activity:   . Days of Exercise per Week:   . Minutes of Exercise per Session:   Stress:   . Feeling of Stress :   Social Connections:   . Frequency of Communication with Friends and  Family:   . Frequency of Social Gatherings with Friends and Family:   . Attends Religious Services:   . Active Member of Clubs or Organizations:   . Attends Archivist Meetings:   Marland Kitchen Marital Status:   Intimate Partner Violence:   . Fear of Current or Ex-Partner:   . Emotionally Abused:   Marland Kitchen Physically Abused:   . Sexually Abused:     Past Surgical History:  Procedure Laterality Date  . BLADDER SUSPENSION    . EXPLORATORY LAPAROTOMY    . IUD REMOVAL  07/25/2017   Procedure: INTRAUTERINE DEVICE (IUD) REMOVAL;  Surgeon: Harlin Heys, MD;  Location: [facility] ORS;  Service: Gynecology;;  . LAPAROSCOPIC ASSISTED VAGINAL HYSTERECTOMY Bilateral 07/25/2017   Procedure: LAPAROSCOPIC ASSISTED VAGINAL HYSTERECTOMY WITH BILATERAL [place] OOPHERECTOMY;  Surgeon: Harlin Heys, MD;  Location: [facility] ORS;  Service: Gynecology;  Laterality: Bilateral;  . TUBAL LIGATION      Family History  Problem Relation Age of Onset  . Hypertension Mother   . Stroke Mother   . Diabetes Son 12       type 1  . Hypertension Maternal Grandmother   . Colon cancer Maternal Grandmother   . Breast cancer Maternal Grandmother   . Skin cancer Maternal Grandmother   . Hypertension Maternal Grandfather   . Hypertension Paternal Grandmother   . Diabetes Paternal Grandmother   . Hypertension Paternal Grandfather     Allergies  Allergen Reactions  . Topamax [Topiramate]     "cant function"   . Tramadol     Cant function with it   . Gluten Meal Diarrhea and Nausea Only       . Ondansetron Other (See Comments)    Helps her nausea, but makes HA worse    Current Outpatient Medications on File Prior to Visit  Medication Sig Dispense Refill  . sertraline (ZOLOFT) 25 MG tablet Take 25 mg by mouth daily.    Marland Kitchen atorvastatin (LIPITOR) 20 MG tablet Take 1 tablet (20 mg total) by mouth every evening. For cholesterol. 90 tablet 3  . losartan (COZAAR) 50 MG tablet Take 1 tablet (50 mg total) by mouth daily.  For blood pressure. 90 tablet 3  . nitrofurantoin (MACRODANTIN) 100 MG capsule Take 1 capsule (100 mg total) by mouth daily. 30 capsule 11  . omeprazole (PRILOSEC) 20 MG capsule TAKE 1 CAPSULE BY MOUTH EVERY DAY 90 capsule 1  . traZODone (DESYREL) 50 MG tablet     . [DISCONTINUED] albuterol (PROVENTIL HFA;VENTOLIN HFA) 108 (90 Base) MCG/ACT inhaler Inhale 2 puffs into the lungs every 4 (four) hours as needed for wheezing or shortness of breath (cough, shortness of breath or wheezing.). (Patient not taking: Reported on 09/01/2018) 1 Inhaler 1   No current facility-administered medications on file prior to visit.  BP 116/74   Pulse 87   Temp (!) 96 F (35.6 C) (Temporal)   Ht 5' (1.524 m)   Wt 163 lb 8 oz (74.2 kg)   LMP  (LMP Unknown)   SpO2 98%   BMI 31.93 kg/m    Objective:   Physical Exam  Constitutional: She appears well-nourished.  Cardiovascular: Normal rate and regular rhythm.  Respiratory: Effort normal and breath sounds normal.  Musculoskeletal:     Cervical back: Neck supple.  Skin: Skin is warm and dry.  Psychiatric:  Appears scattered with thoughts           Assessment & Plan:

## 2019-11-05 ENCOUNTER — Ambulatory Visit: Payer: PRIVATE HEALTH INSURANCE | Attending: Internal Medicine

## 2019-11-05 DIAGNOSIS — Z20822 Contact with and (suspected) exposure to covid-19: Secondary | ICD-10-CM

## 2019-11-06 LAB — SARS-COV-2, NAA 2 DAY TAT

## 2019-11-06 LAB — NOVEL CORONAVIRUS, NAA: SARS-CoV-2, NAA: NOT DETECTED

## 2020-02-06 ENCOUNTER — Other Ambulatory Visit: Payer: Self-pay | Admitting: Primary Care

## 2020-02-06 DIAGNOSIS — I10 Essential (primary) hypertension: Secondary | ICD-10-CM

## 2020-07-02 ENCOUNTER — Encounter (HOSPITAL_COMMUNITY): Payer: Self-pay | Admitting: Emergency Medicine

## 2020-07-02 ENCOUNTER — Other Ambulatory Visit: Payer: Self-pay

## 2020-07-02 ENCOUNTER — Ambulatory Visit (HOSPITAL_COMMUNITY)
Admission: EM | Admit: 2020-07-02 | Discharge: 2020-07-02 | Disposition: A | Discharge status: Home / Self Care | Payer: PRIVATE HEALTH INSURANCE | Attending: Medical Oncology | Admitting: Medical Oncology

## 2020-07-02 DIAGNOSIS — K1379 Other lesions of oral mucosa: Secondary | ICD-10-CM

## 2020-07-02 MED ORDER — NYSTATIN 100000 UNIT/ML MT SUSP: 5.0000 mL | Freq: Three times a day (TID) | OROMUCOSAL | 0 refills | Status: DC | PRN

## 2020-07-02 NOTE — ED Provider Notes (Signed)
[place]    CSN: [id number] Arrival date & time: 07/02/20  1235      History   Chief Complaint Chief Complaint  Patient presents with  . Dental Pain    HPI [name] is a 55 y.o. female.   HPI   Dental Pain: Pt reports that she has had gum and dental pain. This has been present for the past week. No known injury. Feels like a burning sensation all over her mouth. Worse when she eats or drinks. She was seen by her dentist who tried shaving down some teeth that may be causing irritation on gums but pt reports that this has not been helpful. She denies fever, trouble swallowing or flu like symptoms.   Past Medical History:  Diagnosis Date  . Anemia   . BV (bacterial vaginosis) 11/27/2012  . Celiac disease   . Cough due to ACE inhibitor 04/25/2019  . Depression   . Essential hypertension   . Family history of adverse reaction to anesthesia    MOM-HARD TIME WAKING UP  . GERD (gastroesophageal reflux disease)   . Migraine with visual aura    MIGRAINES  . UTI (lower urinary tract infection)     Patient Active Problem List   Diagnosis Date Noted  . GERD (gastroesophageal reflux disease) 04/25/2019  . Hyperlipidemia 03/02/2019  . Left facial numbness 02/23/2019  . Urinary incontinence 02/18/2019  . Essential hypertension 02/18/2019  . Recurrent cystitis 11/27/2012  . Migraine with visual aura 09/19/2011  . Adjustment disorder with mixed anxiety and depressed mood 09/19/2011    Past Surgical History:  Procedure Laterality Date  . BLADDER SUSPENSION    . EXPLORATORY LAPAROTOMY    . IUD REMOVAL  07/25/2017   Procedure: INTRAUTERINE DEVICE (IUD) REMOVAL;  Surgeon: Harlin Heys, MD;  Location: [facility] ORS;  Service: Gynecology;;  . LAPAROSCOPIC ASSISTED VAGINAL HYSTERECTOMY Bilateral 07/25/2017   Procedure: LAPAROSCOPIC ASSISTED VAGINAL HYSTERECTOMY WITH BILATERAL [place] OOPHERECTOMY;  Surgeon: Harlin Heys, MD;  Location: [facility] ORS;  Service:  Gynecology;  Laterality: Bilateral;  . TUBAL LIGATION      OB History    Gravida  3   Para  3   Term  3   Preterm      AB      Living  3     SAB      IAB      Ectopic      Multiple      Live Births  3            Home Medications    Prior to Admission medications   Medication Sig Start Date End Date Taking? Authorizing Provider  omeprazole (PRILOSEC) 20 MG capsule TAKE 1 CAPSULE BY MOUTH EVERY DAY 05/08/19  Yes Stallings, Zoe A, MD  atorvastatin (LIPITOR) 20 MG tablet Take 1 tablet (20 mg total) by mouth every evening. For cholesterol. 03/02/19   [name], NP  nitrofurantoin (MACRODANTIN) 100 MG capsule Take 1 capsule (100 mg total) by mouth daily. 05/03/19   Bjorn Loser, MD  sertraline (ZOLOFT) 25 MG tablet Take 25 mg by mouth daily.    [provider]  traZODone (DESYREL) 50 MG tablet  08/15/19   [provider]  albuterol (PROVENTIL HFA;VENTOLIN HFA) 108 (90 Base) MCG/ACT inhaler Inhale 2 puffs into the lungs every 4 (four) hours as needed for wheezing or shortness of breath (cough, shortness of breath or wheezing.). Patient not taking: Reported on 09/01/2018 07/04/18 12/14/18  Robyn Haber, MD    Family History Family History  Problem Relation Age of Onset  . Hypertension Mother   . Stroke Mother   . Diabetes Son 12       type 1  . Hypertension Maternal Grandmother   . Colon cancer Maternal Grandmother   . Breast cancer Maternal Grandmother   . Skin cancer Maternal Grandmother   . Hypertension Maternal Grandfather   . Hypertension Paternal Grandmother   . Diabetes Paternal Grandmother   . Hypertension Paternal Grandfather     Social History Social History   Tobacco Use  . Smoking status: Never Smoker  . Smokeless tobacco: Never Used  Vaping Use  . Vaping Use: Never used  Substance Use Topics  . Alcohol use: No    Alcohol/week: 0.0 standard drinks  . Drug use: No     Allergies   Topamax [topiramate],  Tramadol, Gluten meal, and Ondansetron   Review of Systems Review of Systems  As stated above in HPI Physical Exam Triage Vital Signs ED Triage Vitals  Enc Vitals Group     BP 07/02/20 1256 (!) 140/94     Pulse Rate 07/02/20 1256 85     Resp 07/02/20 1256 18     Temp 07/02/20 1256 99.8 F (37.7 C)     Temp Source 07/02/20 1256 Oral     SpO2 07/02/20 1256 97 %     Weight --      Height --      Head Circumference --      Peak Flow --      Pain Score 07/02/20 1251 8     Pain Loc --      Pain Edu? --      Excl. in [place]? --    No data found.  Updated Vital Signs BP (!) 140/94 (BP Location: Right Arm)   Pulse 85   Temp 99.8 F (37.7 C) (Oral)   Resp 18   LMP  (LMP Unknown)   SpO2 97%   Physical Exam Vitals and nursing note reviewed.  Constitutional:      General: She is not in acute distress.    Appearance: Normal appearance. She is not ill-appearing, toxic-appearing or diaphoretic.  HENT:     Head: Normocephalic and atraumatic.     Mouth/Throat:     Mouth: Mucous membranes are moist.     Pharynx: No oropharyngeal exudate or posterior oropharyngeal erythema.     Comments: 3 scattered patches of white debris without erythema.  Neurological:     Mental Status: She is alert.      UC Treatments / Results  Labs (all labs ordered are listed, but only abnormal results are displayed) Labs Reviewed - No data to display  EKG   Radiology No results found.  Procedures Procedures (including critical care time)  Medications Ordered in UC Medications - No data to display  Initial Impression / Assessment and Plan / UC Course  I have reviewed the triage vital signs and the nursing notes.  Pertinent labs & imaging results that were available during my care of the patient were reviewed by me and considered in my medical decision making (see chart for details).     New. Likely oral thrush however could also represent burning mouth syndrome. Trial of Magic Mouthwash.  Should this fail to improve symptoms in 1 week I would recommend follow up with PCP. Final Clinical Impressions(s) / UC Diagnoses   Final diagnoses:  None   Discharge Instructions  None    ED Prescriptions    None     PDMP not reviewed this encounter.   Hughie Closs, [place] 07/02/20 1321

## 2020-07-02 NOTE — Discharge Instructions (Addendum)
If this medication does not work your symptoms could represent burning mouth syndrome

## 2020-07-02 NOTE — ED Triage Notes (Addendum)
Patient has had irritated areas in mouth.  Patient reports dentist did some scraping of teeth yesterday to improve her comfort.  Patient has not had any relief.   Patient reports having a pcp.

## 2020-08-14 ENCOUNTER — Encounter (HOSPITAL_COMMUNITY): Payer: Self-pay

## 2020-08-14 ENCOUNTER — Ambulatory Visit (HOSPITAL_COMMUNITY)
Admission: EM | Admit: 2020-08-14 | Discharge: 2020-08-14 | Disposition: A | Discharge status: Home / Self Care | Payer: PRIVATE HEALTH INSURANCE | Attending: Internal Medicine | Admitting: Internal Medicine

## 2020-08-14 ENCOUNTER — Other Ambulatory Visit: Payer: Self-pay

## 2020-08-14 DIAGNOSIS — B373 Candidiasis of vulva and vagina: Secondary | ICD-10-CM | POA: Diagnosis not present

## 2020-08-14 DIAGNOSIS — N390 Urinary tract infection, site not specified: Secondary | ICD-10-CM

## 2020-08-14 DIAGNOSIS — Z113 Encounter for screening for infections with a predominantly sexual mode of transmission: Secondary | ICD-10-CM | POA: Diagnosis not present

## 2020-08-14 DIAGNOSIS — B3731 Acute candidiasis of vulva and vagina: Secondary | ICD-10-CM

## 2020-08-14 LAB — POCT URINALYSIS DIPSTICK, ED / UC
Bilirubin Urine: NEGATIVE
Glucose, UA: NEGATIVE mg/dL
Ketones, ur: NEGATIVE mg/dL
Nitrite: NEGATIVE
Protein, ur: NEGATIVE mg/dL
Specific Gravity, Urine: 1.025 (ref 1.005–1.030)
Urobilinogen, UA: 1 mg/dL (ref 0.0–1.0)
pH: 6 (ref 5.0–8.0)

## 2020-08-14 MED ORDER — SULFAMETHOXAZOLE-TRIMETHOPRIM 800-160 MG PO TABS: 1.0000 | ORAL_TABLET | Freq: Two times a day (BID) | ORAL | 0 refills | Status: AC

## 2020-08-14 MED ORDER — FLUCONAZOLE 150 MG PO TABS: 150.0000 mg | ORAL_TABLET | Freq: Every day | ORAL | 0 refills | Status: DC

## 2020-08-14 NOTE — ED Triage Notes (Signed)
Pt c/o dysuria, cloudy urine, odor and vaginal itching X 2 months.

## 2020-08-14 NOTE — ED Provider Notes (Signed)
[place]    CSN: [id number] Arrival date & time: 08/14/20  1329      History   Chief Complaint Chief Complaint  Patient presents with  . Urinary Tract Infection    HPI [name] is a 55 y.o. female presents with several months of burning with urination, body urine and vaginal itching.  Patient reports noticing bright red blood on tissue this morning prompting urgent care visit.  Patient denies any recent fever or chills, abdominal pain or pelvic pain.  Patient describes discharge as yellowish in color.  She is requesting pelvic exam to determine if any source of bleeding can be seen.  Patient states she is sexually active and uncertain of any STI exposure.   Past Medical History:  Diagnosis Date  . Anemia   . BV (bacterial vaginosis) 11/27/2012  . Celiac disease   . Cough due to ACE inhibitor 04/25/2019  . Depression   . Essential hypertension   . Family history of adverse reaction to anesthesia    MOM-HARD TIME WAKING UP  . GERD (gastroesophageal reflux disease)   . Migraine with visual aura    MIGRAINES  . UTI (lower urinary tract infection)     Patient Active Problem List   Diagnosis Date Noted  . GERD (gastroesophageal reflux disease) 04/25/2019  . Hyperlipidemia 03/02/2019  . Left facial numbness 02/23/2019  . Urinary incontinence 02/18/2019  . Essential hypertension 02/18/2019  . Recurrent cystitis 11/27/2012  . Migraine with visual aura 09/19/2011  . Adjustment disorder with mixed anxiety and depressed mood 09/19/2011    Past Surgical History:  Procedure Laterality Date  . BLADDER SUSPENSION    . EXPLORATORY LAPAROTOMY    . IUD REMOVAL  07/25/2017   Procedure: INTRAUTERINE DEVICE (IUD) REMOVAL;  Surgeon: Harlin Heys, MD;  Location: [facility] ORS;  Service: Gynecology;;  . LAPAROSCOPIC ASSISTED VAGINAL HYSTERECTOMY Bilateral 07/25/2017   Procedure: LAPAROSCOPIC ASSISTED VAGINAL HYSTERECTOMY WITH BILATERAL [place] OOPHERECTOMY;  Surgeon:  Harlin Heys, MD;  Location: [facility] ORS;  Service: Gynecology;  Laterality: Bilateral;  . TUBAL LIGATION      OB History    Gravida  3   Para  3   Term  3   Preterm      AB      Living  3     SAB      IAB      Ectopic      Multiple      Live Births  3            Home Medications    Prior to Admission medications   Medication Sig Start Date End Date Taking? Authorizing Provider  fluconazole (DIFLUCAN) 150 MG tablet Take 1 tablet (150 mg total) by mouth daily. 150 mg x 1.  Okay to repeat in 3 days if symptoms persist 08/14/20  Yes Rudolpho Sevin, NP  sulfamethoxazole-trimethoprim (BACTRIM DS) 800-160 MG tablet Take 1 tablet by mouth 2 (two) times daily for 3 days. 08/14/20 08/17/20 Yes Rudolpho Sevin, NP  magic mouthwash (nystatin, lidocaine, diphenhydrAMINE, alum & mag hydroxide) suspension Swish and spit 5 mLs 3 (three) times daily as needed for mouth pain. 07/02/20   Hughie Closs, PA-C  omeprazole (PRILOSEC) 20 MG capsule TAKE 1 CAPSULE BY MOUTH EVERY DAY 05/08/19   Forrest Moron, MD  albuterol (PROVENTIL HFA;VENTOLIN HFA) 108 (90 Base) MCG/ACT inhaler Inhale 2 puffs into the lungs every 4 (four) hours as needed for wheezing or shortness of  breath (cough, shortness of breath or wheezing.). Patient not taking: Reported on 09/01/2018 07/04/18 12/14/18  Robyn Haber, MD  atorvastatin (LIPITOR) 20 MG tablet Take 1 tablet (20 mg total) by mouth every evening. For cholesterol. 03/02/19 07/02/20  [name], NP  traZODone (DESYREL) 50 MG tablet  08/15/19 07/02/20  [provider]    Family History Family History  Problem Relation Age of Onset  . Hypertension Mother   . Stroke Mother   . Diabetes Son 12       type 1  . Hypertension Maternal Grandmother   . Colon cancer Maternal Grandmother   . Breast cancer Maternal Grandmother   . Skin cancer Maternal Grandmother   . Hypertension Maternal Grandfather   . Hypertension Paternal Grandmother   .  Diabetes Paternal Grandmother   . Hypertension Paternal Grandfather     Social History Social History   Tobacco Use  . Smoking status: Never Smoker  . Smokeless tobacco: Never Used  Vaping Use  . Vaping Use: Never used  Substance Use Topics  . Alcohol use: No    Alcohol/week: 0.0 standard drinks  . Drug use: No     Allergies   Topamax [topiramate], Tramadol, Gluten meal, and Ondansetron   Review of Systems As stated in HPI otherwise negative   Physical Exam Triage Vital Signs ED Triage Vitals  Enc Vitals Group     BP 08/14/20 1351 (!) 139/98     Pulse Rate 08/14/20 1351 88     Resp 08/14/20 1351 17     Temp 08/14/20 1351 98.7 F (37.1 C)     Temp Source 08/14/20 1351 Oral     SpO2 08/14/20 1351 97 %     Weight --      Height --      Head Circumference --      Peak Flow --      Pain Score 08/14/20 1350 5     Pain Loc --      Pain Edu? --      Excl. in [place]? --    No data found.  Updated Vital Signs BP (!) 139/98 (BP Location: Right Arm)   Pulse 88   Temp 98.7 F (37.1 C) (Oral)   Resp 17   LMP  (LMP Unknown)   SpO2 97%   Visual Acuity Right Eye Distance:   Left Eye Distance:   Bilateral Distance:    Right Eye Near:   Left Eye Near:    Bilateral Near:     Physical Exam Constitutional:      General: She is not in acute distress.    Appearance: Normal appearance. She is not ill-appearing or toxic-appearing.  Abdominal:     General: Bowel sounds are normal.     Palpations: Abdomen is soft.     Tenderness: There is no abdominal tenderness. There is no right CVA tenderness, left CVA tenderness or guarding.  Genitourinary:    General: Normal vulva.     Vagina: Vaginal discharge present.     Comments: Scant amount of thick yellow vaginal discharge.  Small area of skin excoriation just beside urethra Skin:    General: Skin is warm and dry.  Neurological:     Mental Status: She is alert.  Psychiatric:        Mood and Affect: Mood normal.         Behavior: Behavior normal.      UC Treatments / Results  Labs (all labs ordered are listed,  but only abnormal results are displayed) Labs Reviewed  POCT URINALYSIS DIPSTICK, ED / UC - Abnormal; Notable for the following components:      Result Value   Hgb urine dipstick SMALL (*)    Leukocytes,Ua SMALL (*)    All other components within normal limits  URINE CULTURE  CERVICOVAGINAL ANCILLARY ONLY    EKG   Radiology No results found.  Procedures Procedures (including critical care time)  Medications Ordered in UC Medications - No data to display  Initial Impression / Assessment and Plan / UC Course  I have reviewed the triage vital signs and the nursing notes.  Pertinent labs & imaging results that were available during my care of the patient were reviewed by me and considered in my medical decision making (see chart for details).  Dysuria, Vaginal discharge -Symptoms ongoing for a couple of months.  Exam consistent with vaginal candidiasis.  Blood on toilet tissue likely related to skin excoriation around the urethra.  Patient instructed to avoid wiping for now and blot with tissue -UA with small hemoglobin and leukocytes.  We will treat empirically with Bactrim DS and send for culture -Diflucan for yeast -STI screening done  Reviewed expections re: course of current medical issues. Questions answered. Outlined signs and symptoms indicating need for more acute intervention. Pt verbalized understanding. AVS given   Final Clinical Impressions(s) / UC Diagnoses   Final diagnoses:  Urinary tract infection without hematuria, site unspecified  Vaginal candidiasis  Routine screening for STI (sexually transmitted infection)     Discharge Instructions     Take medication as prescribed for urinary tract infection and yeast infection.  Please return for any persistent or worsening symptoms.  We will call you if any change in medication or new medications are  needed    ED Prescriptions    Medication Sig Dispense Auth. Provider   fluconazole (DIFLUCAN) 150 MG tablet Take 1 tablet (150 mg total) by mouth daily. 150 mg x 1.  Okay to repeat in 3 days if symptoms persist 2 tablet Rudolpho Sevin, NP   sulfamethoxazole-trimethoprim (BACTRIM DS) 800-160 MG tablet Take 1 tablet by mouth 2 (two) times daily for 3 days. 6 tablet Rudolpho Sevin, NP     PDMP not reviewed this encounter.   Rudolpho Sevin, NP 08/14/20 1515

## 2020-08-14 NOTE — Discharge Instructions (Addendum)
Take medication as prescribed for urinary tract infection and yeast infection.  Please return for any persistent or worsening symptoms.  We will call you if any change in medication or new medications are needed

## 2020-08-15 LAB — CERVICOVAGINAL ANCILLARY ONLY
Bacterial Vaginitis (gardnerella): POSITIVE — AB
Chlamydia: NEGATIVE
Comment: NEGATIVE
Comment: NEGATIVE
Comment: NEGATIVE
Comment: NORMAL
Neisseria Gonorrhea: NEGATIVE
Trichomonas: NEGATIVE

## 2020-08-16 LAB — URINE CULTURE: Culture: >=100000 — AB

## 2020-08-18 ENCOUNTER — Telehealth: Payer: Self-pay | Admitting: Primary Care

## 2020-08-18 ENCOUNTER — Telehealth (HOSPITAL_COMMUNITY): Payer: Self-pay | Admitting: Emergency Medicine

## 2020-08-18 MED ORDER — METRONIDAZOLE 500 MG PO TABS: 500.0000 mg | ORAL_TABLET | Freq: Two times a day (BID) | ORAL | 0 refills | Status: DC

## 2020-08-18 NOTE — Telephone Encounter (Signed)
Negative.  I will not be accepting this transfer of care.  Thanks.

## 2020-08-18 NOTE — Telephone Encounter (Signed)
Opened in error

## 2020-08-18 NOTE — Telephone Encounter (Signed)
Patient wondering if she can do a TOC from [facility] to Dr. Mitchel Honour. Just let me know. Thank you

## 2020-08-18 NOTE — Telephone Encounter (Signed)
Not sure if anything is needed from my end. Nice lady but has not followed up with me in nearly one year.

## 2020-08-24 ENCOUNTER — Encounter (HOSPITAL_COMMUNITY): Payer: Self-pay | Admitting: Emergency Medicine

## 2020-08-24 ENCOUNTER — Ambulatory Visit (HOSPITAL_COMMUNITY)
Admission: EM | Admit: 2020-08-24 | Discharge: 2020-08-24 | Disposition: A | Discharge status: Other Acute Inpatient Hospital | Payer: PRIVATE HEALTH INSURANCE

## 2020-08-24 ENCOUNTER — Encounter (HOSPITAL_COMMUNITY): Payer: Self-pay | Admitting: *Deleted

## 2020-08-24 ENCOUNTER — Other Ambulatory Visit: Payer: Self-pay

## 2020-08-24 ENCOUNTER — Emergency Department (HOSPITAL_COMMUNITY)
Admission: EM | Admit: 2020-08-24 | Discharge: 2020-08-24 | Disposition: A | Discharge status: Home / Self Care | Payer: PRIVATE HEALTH INSURANCE | Attending: Emergency Medicine | Admitting: Emergency Medicine

## 2020-08-24 ENCOUNTER — Emergency Department (HOSPITAL_COMMUNITY): Payer: PRIVATE HEALTH INSURANCE

## 2020-08-24 DIAGNOSIS — K5900 Constipation, unspecified: Secondary | ICD-10-CM

## 2020-08-24 DIAGNOSIS — I10 Essential (primary) hypertension: Secondary | ICD-10-CM | POA: Diagnosis not present

## 2020-08-24 DIAGNOSIS — R112 Nausea with vomiting, unspecified: Secondary | ICD-10-CM | POA: Diagnosis not present

## 2020-08-24 DIAGNOSIS — R1084 Generalized abdominal pain: Secondary | ICD-10-CM

## 2020-08-24 DIAGNOSIS — Z79899 Other long term (current) drug therapy: Secondary | ICD-10-CM | POA: Diagnosis not present

## 2020-08-24 LAB — COMPREHENSIVE METABOLIC PANEL
ALT: 16 U/L (ref 0–44)
AST: 29 U/L (ref 15–41)
Albumin: 4 g/dL (ref 3.5–5.0)
Alkaline Phosphatase: 91 U/L (ref 38–126)
Anion gap: 2 — ABNORMAL LOW (ref 5–15)
BUN: 19 mg/dL (ref 6–20)
CO2: 29 mmol/L (ref 22–32)
Calcium: 9 mg/dL (ref 8.9–10.3)
Chloride: 107 mmol/L (ref 98–111)
Creatinine, Ser: 0.67 mg/dL (ref 0.44–1.00)
GFR, Estimated: >60 mL/min (ref >60)
Glucose, Bld: 111 mg/dL — ABNORMAL HIGH (ref 70–99)
Potassium: 4 mmol/L (ref 3.5–5.1)
Sodium: 138 mmol/L (ref 135–145)
Total Bilirubin: 0.8 mg/dL (ref 0.3–1.2)
Total Protein: 6.9 g/dL (ref 6.5–8.1)

## 2020-08-24 LAB — CBC WITH DIFFERENTIAL/PLATELET
Abs Immature Granulocytes: 0.01 10*3/uL (ref 0.00–0.07)
Basophils Absolute: 0 10*3/uL (ref 0.0–0.1)
Basophils Relative: 1 %
Eosinophils Absolute: 0.2 10*3/uL (ref 0.0–0.5)
Eosinophils Relative: 4 %
HCT: 39.3 % (ref 36.0–46.0)
Hemoglobin: 12.4 g/dL (ref 12.0–15.0)
Immature Granulocytes: 0 %
Lymphocytes Relative: 33 %
Lymphs Abs: 1.4 10*3/uL (ref 0.7–4.0)
MCH: 27.6 pg (ref 26.0–34.0)
MCHC: 31.6 g/dL (ref 30.0–36.0)
MCV: 87.3 fL (ref 80.0–100.0)
Monocytes Absolute: 0.3 10*3/uL (ref 0.1–1.0)
Monocytes Relative: 8 %
Neutro Abs: 2.3 10*3/uL (ref 1.7–7.7)
Neutrophils Relative %: 54 %
Platelets: 267 10*3/uL (ref 150–400)
RBC: 4.5 MIL/uL (ref 3.87–5.11)
RDW: 15.8 % — ABNORMAL HIGH (ref 11.5–15.5)
WBC: 4.2 10*3/uL (ref 4.0–10.5)
nRBC: 0 % (ref 0.0–0.2)

## 2020-08-24 LAB — LIPASE, BLOOD: Lipase: 34 U/L (ref 11–51)

## 2020-08-24 MED ORDER — GOLYTELY 236 G PO SOLR: 4000.0000 mL | Freq: Once | ORAL | 0 refills | Status: AC

## 2020-08-24 NOTE — ED Provider Notes (Addendum)
[facility]    CSN: [id number] Arrival date & time: 08/24/20  1607      History   Chief Complaint Chief Complaint  Patient presents with  . Constipation    3 weeks  . Emesis  . Abdominal Pain    HPI [name] is a 55 y.o. female.   HPI   Constipation: Patient reports that she has had constipation for the past 3 weeks.  She has passed some occasional gas and belching but has not had a bowel movement in this time. Normally has some constipation but never this severe. She also has had abdominal pain and emesis starting yesterday. She has not be able to tolerate fluids or foods starting today. Generalized abdominal pain. No fevers or bloody movements.   Past Medical History:  Diagnosis Date  . Anemia   . BV (bacterial vaginosis) 11/27/2012  . Celiac disease   . Cough due to ACE inhibitor 04/25/2019  . Depression   . Essential hypertension   . Family history of adverse reaction to anesthesia    MOM-HARD TIME WAKING UP  . GERD (gastroesophageal reflux disease)   . Migraine with visual aura    MIGRAINES  . UTI (lower urinary tract infection)     Patient Active Problem List   Diagnosis Date Noted  . GERD (gastroesophageal reflux disease) 04/25/2019  . Hyperlipidemia 03/02/2019  . Left facial numbness 02/23/2019  . Urinary incontinence 02/18/2019  . Essential hypertension 02/18/2019  . Recurrent cystitis 11/27/2012  . Migraine with visual aura 09/19/2011  . Adjustment disorder with mixed anxiety and depressed mood 09/19/2011    Past Surgical History:  Procedure Laterality Date  . BLADDER SUSPENSION    . EXPLORATORY LAPAROTOMY    . IUD REMOVAL  07/25/2017   Procedure: INTRAUTERINE DEVICE (IUD) REMOVAL;  Surgeon: Harlin Heys, MD;  Location: [facility] OR[facility];  Service: Gynecology;;  . LAPAROSCOPIC ASSISTED VAGINAL HYSTERECTOMY Bilateral 07/25/2017   Procedure: LAPAROSCOPIC ASSISTED VAGINAL HYSTERECTOMY WITH BILATERAL [place] OOPHERECTOMY;  Surgeon: Harlin Heys, MD;  Location: [facility] OR[facility];  Service: Gynecology;  Laterality: Bilateral;  . TUBAL LIGATION      OB History    Gravida  3   Para  3   Term  3   Preterm      AB      Living  3     SAB      IAB      Ectopic      Multiple      Live Births  3            Home Medications    Prior to Admission medications   Medication Sig Start Date End Date Taking? Authorizing Provider  fluconazole (DIFLUCAN) 150 MG tablet Take 1 tablet (150 mg total) by mouth daily. 150 mg x 1.  Okay to repeat in 3 days if symptoms persist 08/14/20  Yes Rudolpho Sevin, NP  metroNIDAZOLE (FLAGYL) 500 MG tablet Take 1 tablet (500 mg total) by mouth 2 (two) times daily. 08/18/20  Yes Lamptey, Myrene Galas, MD  magic mouthwash (nystatin, lidocaine, diphenhydrAMINE, alum & mag hydroxide) suspension Swish and spit 5 mLs 3 (three) times daily as needed for mouth pain. 07/02/20   Hughie Closs, PA-C  omeprazole (PRILOSEC) 20 MG capsule TAKE 1 CAPSULE BY MOUTH EVERY DAY 05/08/19   Forrest Moron, MD  albuterol (PROVENTIL HFA;VENTOLIN HFA) 108 (90 Base) MCG/ACT inhaler Inhale 2 puffs into the lungs every 4 (four) hours as  needed for wheezing or shortness of breath (cough, shortness of breath or wheezing.). Patient not taking: Reported on 09/01/2018 07/04/18 12/14/18  Robyn Haber, MD  atorvastatin (LIPITOR) 20 MG tablet Take 1 tablet (20 mg total) by mouth every evening. For cholesterol. 03/02/19 07/02/20  [name], NP  traZODone (DESYREL) 50 MG tablet  08/15/19 07/02/20  [provider]    Family History Family History  Problem Relation Age of Onset  . Hypertension Mother   . Stroke Mother   . Diabetes Son 12       type 1  . Hypertension Maternal Grandmother   . Colon cancer Maternal Grandmother   . Breast cancer Maternal Grandmother   . Skin cancer Maternal Grandmother   . Hypertension Maternal Grandfather   . Hypertension Paternal Grandmother   . Diabetes Paternal Grandmother    . Hypertension Paternal Grandfather     Social History Social History   Tobacco Use  . Smoking status: Never Smoker  . Smokeless tobacco: Never Used  Vaping Use  . Vaping Use: Never used  Substance Use Topics  . Alcohol use: No    Alcohol/week: 0.0 standard drinks  . Drug use: No     Allergies   Topamax [topiramate], Tramadol, Gluten meal, and Ondansetron   Review of Systems Review of Systems  As stated above in HPI Physical Exam Triage Vital Signs ED Triage Vitals  Enc Vitals Group     BP 08/24/20 1716 (!) 134/96     Pulse Rate 08/24/20 1716 (!) 106     Resp 08/24/20 1716 20     Temp 08/24/20 1716 98.5 F (36.9 C)     Temp src --      SpO2 08/24/20 1716 96 %     Weight --      Height --      Head Circumference --      Peak Flow --      Pain Score 08/24/20 1712 8     Pain Loc --      Pain Edu? --      Excl. in [place]? --    No data found.  Updated Vital Signs BP (!) 134/96   Pulse (!) 106   Temp 98.5 F (36.9 C)   Resp 20   LMP  (LMP Unknown)   SpO2 96%   Physical Exam Vitals and nursing note reviewed.  Constitutional:      General: She is not in acute distress.    Appearance: She is well-developed. She is not ill-appearing, toxic-appearing or diaphoretic.  HENT:     Head: Normocephalic and atraumatic.  Eyes:     Comments: Mild pallor and mild potential juandice   Cardiovascular:     Rate and Rhythm: Regular rhythm. Tachycardia present.     Heart sounds: Murmur heard.    Pulmonary:     Effort: Pulmonary effort is normal.     Breath sounds: Normal breath sounds.  Abdominal:     General: Bowel sounds are increased. There is distension (mild).     Palpations: Abdomen is rigid. There is no hepatomegaly, splenomegaly or mass.     Tenderness: There is generalized abdominal tenderness. There is no right CVA tenderness, left CVA tenderness, guarding or rebound. Negative signs include Murphy's sign and McBurney's sign.     Hernia: No hernia is  present.     Comments: Rushing sounds  Skin:    Coloration: Skin is jaundiced (mild) and pale (mild).  Neurological:  Mental Status: She is alert and oriented to person, place, and time.      UC Treatments / Results  Labs (all labs ordered are listed, but only abnormal results are displayed) Labs Reviewed - No data to display  EKG   Radiology No results found.  Procedures Procedures (including critical care time)  Medications Ordered in UC Medications - No data to display  Initial Impression / Assessment and Plan / UC Course  I have reviewed the triage vital signs and the nursing notes.  Pertinent labs & imaging results that were available during my care of the patient were reviewed by me and considered in my medical decision making (see chart for details).     New.  Patient elects to transfer via private vehicle to the emergency room for work-up for potential bowel obstruction.  She will be n.p.o. Final Clinical Impressions(s) / UC Diagnoses   Final diagnoses:  None   Discharge Instructions   None    ED Prescriptions    None     PDMP not reviewed this encounter.   Hughie Closs, PA-C 08/24/20 1811    Hughie Closs, PA-C 08/24/20 1812

## 2020-08-24 NOTE — ED Provider Notes (Signed)
[facility] DEPT Provider Note   CSN: [id number] Arrival date & time: 08/24/20  1830     History Chief Complaint  Patient presents with  . Constipation    [name] is a 55 y.o. female.  55 year old female presents with constipation times several weeks worse last several days.  Did have emesis 3 days ago but none currently.  Denies any fever or chills.  No current abdominal distention.  Went to urgent care and was sent here for further management.  Has been using over-the-counter medications without relief.        Past Medical History:  Diagnosis Date  . Anemia   . BV (bacterial vaginosis) 11/27/2012  . Celiac disease   . Cough due to ACE inhibitor 04/25/2019  . Depression   . Essential hypertension   . Family history of adverse reaction to anesthesia    MOM-HARD TIME WAKING UP  . GERD (gastroesophageal reflux disease)   . Migraine with visual aura    MIGRAINES  . UTI (lower urinary tract infection)     Patient Active Problem List   Diagnosis Date Noted  . GERD (gastroesophageal reflux disease) 04/25/2019  . Hyperlipidemia 03/02/2019  . Left facial numbness 02/23/2019  . Urinary incontinence 02/18/2019  . Essential hypertension 02/18/2019  . Recurrent cystitis 11/27/2012  . Migraine with visual aura 09/19/2011  . Adjustment disorder with mixed anxiety and depressed mood 09/19/2011    Past Surgical History:  Procedure Laterality Date  . BLADDER SUSPENSION    . EXPLORATORY LAPAROTOMY    . IUD REMOVAL  07/25/2017   Procedure: INTRAUTERINE DEVICE (IUD) REMOVAL;  Surgeon: Harlin Heys, MD;  Location: [facility] OR[facility];  Service: Gynecology;;  . LAPAROSCOPIC ASSISTED VAGINAL HYSTERECTOMY Bilateral 07/25/2017   Procedure: LAPAROSCOPIC ASSISTED VAGINAL HYSTERECTOMY WITH BILATERAL [place] OOPHERECTOMY;  Surgeon: Harlin Heys, MD;  Location: [facility] ORS;  Service: Gynecology;  Laterality: Bilateral;  . TUBAL LIGATION       OB History     Gravida  3   Para  3   Term  3   Preterm      AB      Living  3     SAB      IAB      Ectopic      Multiple      Live Births  3           Family History  Problem Relation Age of Onset  . Hypertension Mother   . Stroke Mother   . Diabetes Son 12       type 1  . Hypertension Maternal Grandmother   . Colon cancer Maternal Grandmother   . Breast cancer Maternal Grandmother   . Skin cancer Maternal Grandmother   . Hypertension Maternal Grandfather   . Hypertension Paternal Grandmother   . Diabetes Paternal Grandmother   . Hypertension Paternal Grandfather     Social History   Tobacco Use  . Smoking status: Never Smoker  . Smokeless tobacco: Never Used  Vaping Use  . Vaping Use: Never used  Substance Use Topics  . Alcohol use: No    Alcohol/week: 0.0 standard drinks  . Drug use: No    Home Medications Prior to Admission medications   Medication Sig Start Date End Date Taking? Authorizing Provider  fluconazole (DIFLUCAN) 150 MG tablet Take 1 tablet (150 mg total) by mouth daily. 150 mg x 1.  Okay to repeat in 3 days if symptoms persist 08/14/20  Rudolpho Sevin, NP  magic mouthwash (nystatin, lidocaine, diphenhydrAMINE, alum & mag hydroxide) suspension Swish and spit 5 mLs 3 (three) times daily as needed for mouth pain. 07/02/20   Hughie Closs, PA-C  metroNIDAZOLE (FLAGYL) 500 MG tablet Take 1 tablet (500 mg total) by mouth 2 (two) times daily. 08/18/20   Lamptey, Myrene Galas, MD  omeprazole (PRILOSEC) 20 MG capsule TAKE 1 CAPSULE BY MOUTH EVERY DAY 05/08/19   Forrest Moron, MD  albuterol (PROVENTIL HFA;VENTOLIN HFA) 108 (90 Base) MCG/ACT inhaler Inhale 2 puffs into the lungs every 4 (four) hours as needed for wheezing or shortness of breath (cough, shortness of breath or wheezing.). Patient not taking: Reported on 09/01/2018 07/04/18 12/14/18  Robyn Haber, MD  atorvastatin (LIPITOR) 20 MG tablet Take 1 tablet (20 mg total) by mouth every evening. For  cholesterol. 03/02/19 07/02/20  [name], NP  traZODone (DESYREL) 50 MG tablet  08/15/19 07/02/20  [provider]    Allergies    Topamax [topiramate], Tramadol, Gluten meal, and Ondansetron  Review of Systems   Review of Systems  All other systems reviewed and are negative.   Physical Exam Updated Vital Signs BP (!) 147/99 (BP Location: Left Arm)   Pulse 98   Temp 98.2 F (36.8 C) (Oral)   Resp 18   LMP  (LMP Unknown)   SpO2 97%   Physical Exam Vitals and nursing note reviewed.  Constitutional:      General: She is not in acute distress.    Appearance: Normal appearance. She is well-developed. She is not toxic-appearing.  HENT:     Head: Normocephalic and atraumatic.  Eyes:     General: Lids are normal.     Conjunctiva/sclera: Conjunctivae normal.     Pupils: Pupils are equal, round, and reactive to light.  Neck:     Thyroid: No thyroid mass.     Trachea: No tracheal deviation.  Cardiovascular:     Rate and Rhythm: Normal rate and regular rhythm.     Heart sounds: Normal heart sounds. No murmur heard. No gallop.   Pulmonary:     Effort: Pulmonary effort is normal. No respiratory distress.     Breath sounds: Normal breath sounds. No stridor. No decreased breath sounds, wheezing, rhonchi or rales.  Abdominal:     General: Bowel sounds are normal. There is no distension.     Palpations: Abdomen is soft.     Tenderness: There is no abdominal tenderness. There is no rebound.     Comments: Abdomen soft nontender  Musculoskeletal:        General: No tenderness. Normal range of motion.     Cervical back: Normal range of motion and neck supple.  Skin:    General: Skin is warm and dry.     Findings: No abrasion or rash.  Neurological:     Mental Status: She is alert and oriented to person, place, and time.     GCS: GCS eye subscore is 4. GCS verbal subscore is 5. GCS motor subscore is 6.     Cranial Nerves: No cranial nerve deficit.     Sensory: No  sensory deficit.  Psychiatric:        Speech: Speech normal.        Behavior: Behavior normal.     ED Results / Procedures / Treatments   Labs (all labs ordered are listed, but only abnormal results are displayed) Labs Reviewed  CBC WITH DIFFERENTIAL/PLATELET - Abnormal; Notable for  the following components:      Result Value   RDW 15.8 (*)    All other components within normal limits  COMPREHENSIVE METABOLIC PANEL - Abnormal; Notable for the following components:   Glucose, Bld 111 (*)    Anion gap 2 (*)    All other components within normal limits  LIPASE, BLOOD  URINALYSIS, ROUTINE W REFLEX MICROSCOPIC    EKG None  Radiology DG Abdomen 1 View  Result Date: 08/24/2020 CLINICAL DATA:  2-3 weeks of constipation EXAM: ABDOMEN - 1 VIEW COMPARISON:  July 30, 2011. FINDINGS: The bowel gas pattern is normal. Moderate volume of formed stool throughout the colon. IMPRESSION: Nonobstructive bowel gas pattern. Moderate volume of formed stool throughout the colon. Electronically Signed   By: Dahlia Bailiff MD   On: 08/24/2020 21:15    Procedures Procedures   Medications Ordered in ED Medications - No data to display  ED Course  I have reviewed the triage vital signs and the nursing notes.  Pertinent labs & imaging results that were available during my care of the patient were reviewed by me and considered in my medical decision making (see chart for details).    MDM Rules/Calculators/A&P                          Labs here are reassuring.  Abdominal flatplate without evidence of obstruction but show moderate mount of stool.  Did offer patient abdominal CT to rule out obstruction.  She however has no signs of obstruction at this time being that she has no current emesis, is passing flatus, and no abdominal distention.  Patient is agreeable to trying GoLytely and returning if her symptoms worse. Final Clinical Impression(s) / ED Diagnoses Final diagnoses:  None    Rx / DC  Orders ED Discharge Orders    None       Lacretia Leigh, MD 08/24/20 2135

## 2020-08-24 NOTE — Discharge Instructions (Signed)
Return here at once if you develop vomiting, fever, abdominal pain

## 2020-08-24 NOTE — ED Provider Notes (Signed)
Emergency Medicine Provider Triage Evaluation Note  [name] , a 55 y.o. female  was evaluated in triage.  Pt complains of constipation, nausea, and vomiting.  Constipation x 3 weeks.  Has only produced minimal amount of small hard stool.  Passing flatus.  Nausea intermittently over last 3 weeks.  Vomiting on Thursday, no bloody e,esis or coffee ground emesis. Complains of abdominal cramping.   History of ex lap.    Review of Systems  Positive: Diarrhea, nausea, vomiting, Negative: Fever, chills, dysuria, hematuria, blood in stool, rectal pain, rectal bleeding  Physical Exam  BP (!) 147/99 (BP Location: Left Arm)   Pulse 98   Temp 98.2 F (36.8 C) (Oral)   Resp 18   LMP  (LMP Unknown)   SpO2 97%  Gen:   Awake, no distress   Resp:  Normal effort  MSK:   Moves extremities without difficulty  Other:  abd soft, nondistened, no tenderness   Medical Decision Making  Medically screening exam initiated at 6:58 PM.  Appropriate orders placed.  Akshaya Joubert was informed that the remainder of the evaluation will be completed by another provider, this initial triage assessment does not replace that evaluation, and the importance of remaining in the ED until their evaluation is complete.  The patient appears stable so that the remainder of the work up may be completed by another provider.      Dyann Ruddle 08/24/20 1903    Lacretia Leigh, MD 08/27/20 0930

## 2020-08-24 NOTE — Discharge Instructions (Addendum)
Please go directly to the emergency room

## 2020-08-24 NOTE — ED Triage Notes (Signed)
Pt reports constipation for 3 weeks . Pt vomited yesterday.

## 2020-08-24 NOTE — ED Triage Notes (Signed)
Patient reports constipation x4 weeks with nausea. Vomiting x3 days ago and nausea since that time. Sent from [facility] for further evaluation.

## 2020-09-01 ENCOUNTER — Telehealth: Payer: Self-pay

## 2020-09-01 NOTE — Telephone Encounter (Signed)
No answer and left v/m for pt to cb to [facility]. Please see note from Dr Einar Pheasant below this entry. Sending note to [facility] CMA.

## 2020-09-01 NOTE — Telephone Encounter (Signed)
[facility] - Client TELEPHONE ADVICE RECORD [facility] Patient Name: [name] [place] Gender: Female DOB: [birth year] Age: 55 Y 10 M 1 D Return Phone Number: [phone] (Primary) Address: City/ State/ Zip: [place] [address] Client [place] [facility] - Client Client Site [facility] - Day Physician Alma Friendly - NP Contact Type Call Who Is Calling Patient / Member / Family / Caregiver Call Type Triage / Clinical Relationship To Patient Self Return Phone Number [phone] (Primary) Chief Complaint Constipation Reason for Call Symptomatic / Request for [place] states she is constipated. Translation No Nurse Assessment Nurse: Doyle Askew, RN, Beth Date/Time (Eastern Time): 09/01/2020 12:06:52 PM Confirm and document reason for call. If symptomatic, describe symptoms. ---Caller states she is constipated (last BM last Tuesday). Caller was seen in UC - ED last week due to this, ED gave med and worked until last Tuesday. Caller states stomach feels sore, tender and gets nauseated when eating. Does the patient have any new or worsening symptoms? ---Yes Will a triage be completed? ---Yes Related visit to physician within the last 2 weeks? ---Yes Does the PT have any chronic conditions? (i.e. diabetes, asthma, this includes High risk factors for pregnancy, etc.) ---No Is the patient pregnant or possibly pregnant? (Ask all females between the ages of 37-55) ---No Is this a behavioral health or substance abuse call? ---No Guidelines Guideline Title Affirmed Question Affirmed Notes Nurse Date/Time (Eastern Time) Constipation Last bowel movement (BM) > 4 days ago Doyle Askew, Therapist, sports, [facility] 09/01/2020 12:09:01 PM Disp. Time Eilene Ghazi Time) Disposition Final User 09/01/2020 11:46:57 AM Attempt made - message left [place], [place] 09/01/2020 12:11:21 PM See PCP within 24 Hours Yes  Doyle Askew, RN, Beth PLEASE NOTE: All timestamps contained within this report are represented as [place] Standard Time. CONFIDENTIALTY NOTICE: This fax transmission is intended only for the addressee. It contains information that is legally privileged, confidential or otherwise protected from use or disclosure. If you are not the intended recipient, you are strictly prohibited from reviewing, disclosing, copying using or disseminating any of this information or taking any action in reliance on or regarding this information. If you have received this fax in error, please notify us immediately by telephone so that we can arrange for its return to [name]. Phone: [phone], Toll-Free: [phone], Fax: [phone] Page: 2 of 2 Call Id: [id number] [place] Disagree/Comply Comply Caller Understands Yes PreDisposition Did not know what to do Care Advice Given Per Guideline SEE PCP WITHIN 24 HOURS: * IF OFFICE WILL BE OPEN: You need to be examined within the next 24 hours. Call your doctor (or NP/PA) when the office opens and make an appointment. CALL BACK IF: * Severe or increasing abdomen pain * Constant abdomen pain lasting over 2 hours * Vomiting occurs * You become worse CARE ADVICE given per Constipation (Adult) guideline. Comments User: Melene Muller, RN Date/Time Eilene Ghazi Time): 09/01/2020 12:11:16 PM Caller states she has appt on the 31st Referrals REFERRED TO PCP OFFICE

## 2020-09-01 NOTE — Telephone Encounter (Signed)
Noted.   If pt calls back would recommend she consider Magnesium Citrate or Suppository.

## 2020-09-01 NOTE — Telephone Encounter (Signed)
I tried to call pt and line was busy; sending note to myself, Joellen and Dr Einar Pheasant. Pt does have ED FU already scheduled on 09/09/20.

## 2020-09-04 ENCOUNTER — Other Ambulatory Visit: Payer: Self-pay

## 2020-09-04 ENCOUNTER — Emergency Department
Admission: EM | Admit: 2020-09-04 | Discharge: 2020-09-04 | Disposition: A | Discharge status: Home / Self Care | Payer: PRIVATE HEALTH INSURANCE | Attending: Emergency Medicine | Admitting: Emergency Medicine

## 2020-09-04 ENCOUNTER — Emergency Department: Payer: PRIVATE HEALTH INSURANCE

## 2020-09-04 DIAGNOSIS — R197 Diarrhea, unspecified: Secondary | ICD-10-CM | POA: Diagnosis present

## 2020-09-04 DIAGNOSIS — I10 Essential (primary) hypertension: Secondary | ICD-10-CM | POA: Insufficient documentation

## 2020-09-04 DIAGNOSIS — K5909 Other constipation: Secondary | ICD-10-CM | POA: Diagnosis not present

## 2020-09-04 LAB — URINALYSIS, COMPLETE (UACMP) WITH MICROSCOPIC
Bacteria, UA: NONE SEEN
Bilirubin Urine: NEGATIVE
Glucose, UA: NEGATIVE mg/dL
Ketones, ur: NEGATIVE mg/dL
Nitrite: NEGATIVE
Protein, ur: NEGATIVE mg/dL
Specific Gravity, Urine: 1.02 (ref 1.005–1.030)
pH: 5 (ref 5.0–8.0)

## 2020-09-04 LAB — COMPREHENSIVE METABOLIC PANEL
ALT: 11 U/L (ref 0–44)
AST: 16 U/L (ref 15–41)
Albumin: 3.8 g/dL (ref 3.5–5.0)
Alkaline Phosphatase: 94 U/L (ref 38–126)
Anion gap: 7 (ref 5–15)
BUN: 15 mg/dL (ref 6–20)
CO2: 23 mmol/L (ref 22–32)
Calcium: 9.1 mg/dL (ref 8.9–10.3)
Chloride: 110 mmol/L (ref 98–111)
Creatinine, Ser: 0.56 mg/dL (ref 0.44–1.00)
GFR, Estimated: >60 mL/min (ref >60)
Glucose, Bld: 90 mg/dL (ref 70–99)
Potassium: 3.6 mmol/L (ref 3.5–5.1)
Sodium: 140 mmol/L (ref 135–145)
Total Bilirubin: 0.6 mg/dL (ref 0.3–1.2)
Total Protein: 7 g/dL (ref 6.5–8.1)

## 2020-09-04 LAB — CBC
HCT: 40.3 % (ref 36.0–46.0)
Hemoglobin: 13 g/dL (ref 12.0–15.0)
MCH: 27.1 pg (ref 26.0–34.0)
MCHC: 32.3 g/dL (ref 30.0–36.0)
MCV: 84 fL (ref 80.0–100.0)
Platelets: 252 10*3/uL (ref 150–400)
RBC: 4.8 MIL/uL (ref 3.87–5.11)
RDW: 15.1 % (ref 11.5–15.5)
WBC: 6.4 10*3/uL (ref 4.0–10.5)
nRBC: 0 % (ref 0.0–0.2)

## 2020-09-04 LAB — LIPASE, BLOOD: Lipase: 30 U/L (ref 11–51)

## 2020-09-04 MED ORDER — ONDANSETRON 4 MG PO TBDP
4.0000 mg | ORAL_TABLET | Freq: Once | ORAL | Status: DC
  Filled 2020-09-04: qty 1

## 2020-09-04 MED ORDER — METOCLOPRAMIDE HCL 10 MG PO TABS
10.0000 mg | ORAL_TABLET | Freq: Once | ORAL | Status: AC
  Administered 2020-09-04: 10 mg via ORAL
  Filled 2020-09-04: qty 1

## 2020-09-04 MED ORDER — METOCLOPRAMIDE HCL 10 MG PO TABS: 10.0000 mg | ORAL_TABLET | Freq: Three times a day (TID) | ORAL | 0 refills | Status: DC | PRN

## 2020-09-04 NOTE — ED Notes (Signed)
Pt given saltine crackers and ginger ale at this time. Encouraged to eat/drink per order at this time.

## 2020-09-04 NOTE — ED Notes (Signed)
Pt verbalizes understanding of waiver of MSE.

## 2020-09-04 NOTE — ED Notes (Signed)
Pt able to tolerate PO intake well at this time. Pt denies N/V at this time.

## 2020-09-04 NOTE — Discharge Instructions (Addendum)
Use Reglan at home to help treat your nausea.  I recommend that you get some over-the-counter MiraLAX, or its generic equivalent, to use regularly at home to help with her chronic constipation.  If you develop any severely worsening abdominal pain, fevers with your symptoms, return to the ED.

## 2020-09-04 NOTE — ED Provider Notes (Signed)
[facility] Emergency Department Provider Note ____________________________________________   Event Date/Time   First MD Initiated Contact with Patient 09/04/20 1332     (approximate)  I have reviewed the triage vital signs and the nursing notes.  HISTORY  Chief Complaint Diarrhea   HPI [name] is a 55 y.o. femalewho presents to the ED for evaluation of diarrhea.   Chart review indicates hx constipation and seen at neighboring ED 11 days ago for this.  Patient reports getting mag citrate during this ED visit, passing a bowel movement that day, but then was constipated and had not passed a bowel movement until 2 days ago.  Patient reports yesterday morning beginning to have loose stools and diarrhea, with a single episode of stool incontinence to start all of this.  Reports recurrently passing loose stools and bowel movements over the past 36 hours.  Reports her stools have been more formed this morning, "like soft serve."  She reports feeling drained, weak and tired in a generalized fashion.  She reports feeling nauseous, but has tolerated p.o. intake without emesis.  Denies abdominal pain, fever, dysuria, hematuria, vaginal discharge or bleeding.  Reports that she has not taken any medications for constipation at home.  Family arrives to the ED while I am finishing my initial evaluation of the patient.  Family reports that patient is frequently constipated, and has been dealing with this since she was a child.  Past Medical History:  Diagnosis Date  . Anemia   . BV (bacterial vaginosis) 11/27/2012  . Celiac disease   . Cough due to ACE inhibitor 04/25/2019  . Depression   . Essential hypertension   . Family history of adverse reaction to anesthesia    MOM-HARD TIME WAKING UP  . GERD (gastroesophageal reflux disease)   . Migraine with visual aura    MIGRAINES  . UTI (lower urinary tract infection)     Patient Active Problem List   Diagnosis Date  Noted  . GERD (gastroesophageal reflux disease) 04/25/2019  . Hyperlipidemia 03/02/2019  . Left facial numbness 02/23/2019  . Urinary incontinence 02/18/2019  . Essential hypertension 02/18/2019  . Recurrent cystitis 11/27/2012  . Migraine with visual aura 09/19/2011  . Adjustment disorder with mixed anxiety and depressed mood 09/19/2011    Past Surgical History:  Procedure Laterality Date  . BLADDER SUSPENSION    . EXPLORATORY LAPAROTOMY    . IUD REMOVAL  07/25/2017   Procedure: INTRAUTERINE DEVICE (IUD) REMOVAL;  Surgeon: Harlin Heys, MD;  Location: [facility] ORS;  Service: Gynecology;;  . LAPAROSCOPIC ASSISTED VAGINAL HYSTERECTOMY Bilateral 07/25/2017   Procedure: LAPAROSCOPIC ASSISTED VAGINAL HYSTERECTOMY WITH BILATERAL [place] OOPHERECTOMY;  Surgeon: Harlin Heys, MD;  Location: [facility] ORS;  Service: Gynecology;  Laterality: Bilateral;  . TUBAL LIGATION      Prior to Admission medications   Medication Sig Start Date End Date Taking? Authorizing Provider  metoCLOPramide (REGLAN) 10 MG tablet Take 1 tablet (10 mg total) by mouth every 8 (eight) hours as needed for nausea or vomiting. 09/04/20 09/04/21 Yes Vladimir Crofts, MD  fluconazole (DIFLUCAN) 150 MG tablet Take 1 tablet (150 mg total) by mouth daily. 150 mg x 1.  Okay to repeat in 3 days if symptoms persist 08/14/20   Rudolpho Sevin, NP  magic mouthwash (nystatin, lidocaine, diphenhydrAMINE, alum & mag hydroxide) suspension Swish and spit 5 mLs 3 (three) times daily as needed for mouth pain. 07/02/20   Hughie Closs, PA-C  metroNIDAZOLE (FLAGYL) 500 MG  tablet Take 1 tablet (500 mg total) by mouth 2 (two) times daily. 08/18/20   Lamptey, Myrene Galas, MD  omeprazole (PRILOSEC) 20 MG capsule TAKE 1 CAPSULE BY MOUTH EVERY DAY 05/08/19   Forrest Moron, MD  albuterol (PROVENTIL HFA;VENTOLIN HFA) 108 (90 Base) MCG/ACT inhaler Inhale 2 puffs into the lungs every 4 (four) hours as needed for wheezing or shortness of breath (cough,  shortness of breath or wheezing.). Patient not taking: Reported on 09/01/2018 07/04/18 12/14/18  Robyn Haber, MD  atorvastatin (LIPITOR) 20 MG tablet Take 1 tablet (20 mg total) by mouth every evening. For cholesterol. 03/02/19 07/02/20  [name], NP  traZODone (DESYREL) 50 MG tablet  08/15/19 07/02/20  [provider]    Allergies Topamax [topiramate], Tramadol, Gluten meal, and Ondansetron  Family History  Problem Relation Age of Onset  . Hypertension Mother   . Stroke Mother   . Diabetes Son 12       type 1  . Hypertension Maternal Grandmother   . Colon cancer Maternal Grandmother   . Breast cancer Maternal Grandmother   . Skin cancer Maternal Grandmother   . Hypertension Maternal Grandfather   . Hypertension Paternal Grandmother   . Diabetes Paternal Grandmother   . Hypertension Paternal Grandfather     Social History Social History   Tobacco Use  . Smoking status: Never Smoker  . Smokeless tobacco: Never Used  Vaping Use  . Vaping Use: Never used  Substance Use Topics  . Alcohol use: No    Alcohol/week: 0.0 standard drinks  . Drug use: No    Review of Systems  Constitutional: No fever/chills.  Positive for generalized weakness Eyes: No visual changes. ENT: No sore throat. Cardiovascular: Denies chest pain. Respiratory: Denies shortness of breath. Gastrointestinal: No abdominal pain. no vomiting.  Positive for nausea, diarrhea and constipation Genitourinary: Negative for dysuria. Musculoskeletal: Negative for back pain. Skin: Negative for rash. Neurological: Negative for headaches, focal weakness or numbness.  ____________________________________________   PHYSICAL EXAM:  VITAL SIGNS: Vitals:   09/04/20 1215 09/04/20 1430  BP: (!) 151/96 132/80  Pulse: 93 91  Resp: 16 16  Temp: 98.3 F (36.8 C)   SpO2: 98% 97%     Constitutional: Alert and oriented. Well appearing and in no acute distress. Eyes: Conjunctivae are normal. PERRL.  EOMI. Head: Atraumatic. Nose: No congestion/rhinnorhea. Mouth/Throat: Mucous membranes are moist.  Oropharynx non-erythematous. Neck: No stridor. No cervical spine tenderness to palpation. Cardiovascular: Normal rate, regular rhythm. Grossly normal heart sounds.  Good peripheral circulation. Respiratory: Normal respiratory effort.  No retractions. Lungs CTAB. Gastrointestinal: Soft , nondistended, nontender to palpation. No CVA tenderness.  Soft and benign abdomen. Musculoskeletal: No lower extremity tenderness nor edema.  No joint effusions. No signs of acute trauma. Neurologic:  Normal speech and language. No gross focal neurologic deficits are appreciated. No gait instability noted. Skin:  Skin is warm, dry and intact. No rash noted. Psychiatric: Mood and affect are normal. Speech and behavior are normal.  ____________________________________________   LABS (all labs ordered are listed, but only abnormal results are displayed)  Labs Reviewed  LIPASE, BLOOD  COMPREHENSIVE METABOLIC PANEL  CBC  URINALYSIS, COMPLETE (UACMP) WITH MICROSCOPIC   ____________________________________________  RADIOLOGY  ED MD interpretation:  KUB reviewed by me with nonobstructive bowel gas pattern  Official radiology report(s): DG Abdomen 1 View  Result Date: 09/04/2020 CLINICAL DATA:  Constipation and diarrhea EXAM: ABDOMEN - 1 VIEW COMPARISON:  08/24/2020 FINDINGS: Nonobstructed bowel-gas pattern with mild stool  in the colon. No radiopaque calculi. Phleboliths in the pelvis. IMPRESSION: Negative.  Mild stool burden. Electronically Signed   By: Donavan Foil M.D.   On: 09/04/2020 14:42    ____________________________________________   PROCEDURES and INTERVENTIONS  Procedure(s) performed (including Critical Care):  .1-3 Lead EKG Interpretation Performed by: Vladimir Crofts, MD Authorized by: Vladimir Crofts, MD     Interpretation: normal     ECG rate:  86   ECG rate assessment: normal      Rhythm: sinus rhythm     Ectopy: none     Conduction: normal      Medications  ondansetron (ZOFRAN-ODT) disintegrating tablet 4 mg (4 mg Oral Patient Refused/Not Given 09/04/20 1351)  metoCLOPramide (REGLAN) tablet 10 mg (has no administration in time range)    ____________________________________________   MDM / ED COURSE   55 year old woman with history of chronic constipation presents to the ED with profuse stools after 8 days of constipation, without evidence of acute pathology, and likely amenable to outpatient management.  Normal vitals.  Exam reassuring without abdominal pain or tenderness.  She looks well clinically and just reports some mild nausea and generalized weakness.  Work-up is benign with no evidence of SBO, pancreatitis, systemic illness.  Awaiting urinalysis prior to discharge and patient will be signed out to outcome provider to follow-up in this UA.  Anticipate she be suitable for outpatient management regardless.  I discussed with her Reglan at home to help with her nausea and GI motility.  We discussed OTC MiraLAX and return precautions for the ED prior to discharge.  Clinical Course as of 09/04/20 1456  Thu Sep 04, 2020  1450 Reassessed.  Patient reports still feeling weak in a generalized fashion.  We discussed need for urinalysis.  We discussed reassuring work-up and imaging so far.  Patient refusing Zofran due to it causing worsening headaches in the past, we discussed the possibility of Reglan to help with her nausea at home.  She is agreeable. [DS]    Clinical Course User Index [DS] Vladimir Crofts, MD    ____________________________________________   FINAL CLINICAL IMPRESSION(S) / ED DIAGNOSES  Final diagnoses:  Diarrhea, unspecified type  Other constipation     ED Discharge Orders         Ordered    metoCLOPramide (REGLAN) 10 MG tablet  Every 8 hours PRN        09/04/20 1451           Meilyn Heindl   Note:  This document was prepared using  Systems analyst and may include unintentional dictation errors.   Vladimir Crofts, MD 09/04/20 [phone]

## 2020-09-04 NOTE — ED Notes (Signed)
MD Vladimir Crofts aware that pt refused zofran due to it causing her migraines at this time.

## 2020-09-04 NOTE — ED Triage Notes (Addendum)
Pt to ER via POV with complaints of weakness and diarrhea that started yesterday. Reports she has been sleeping constantly and has had multiple episodes of diarrhea through to this morning. Denies known fevers.   Reports on going issues since 5/15 with constipation x3-4 weeks, that was relieved after taking medications, but was then constipated again.   Reports she was given a shot of Toradol on Tuesday for a migraine.

## 2020-09-04 NOTE — ED Notes (Signed)
Pt to CT at this time.

## 2020-09-09 ENCOUNTER — Other Ambulatory Visit: Payer: Self-pay

## 2020-09-09 ENCOUNTER — Encounter: Payer: Self-pay | Admitting: Primary Care

## 2020-09-09 ENCOUNTER — Ambulatory Visit: Payer: PRIVATE HEALTH INSURANCE | Admitting: Primary Care

## 2020-09-09 VITALS — BP 146/88 | HR 108 | Temp 98.6°F | Ht 60.0 in | Wt 152.0 lb

## 2020-09-09 DIAGNOSIS — Z1159 Encounter for screening for other viral diseases: Secondary | ICD-10-CM

## 2020-09-09 DIAGNOSIS — G43109 Migraine with aura, not intractable, without status migrainosus: Secondary | ICD-10-CM

## 2020-09-09 DIAGNOSIS — K219 Gastro-esophageal reflux disease without esophagitis: Secondary | ICD-10-CM

## 2020-09-09 DIAGNOSIS — K5909 Other constipation: Secondary | ICD-10-CM | POA: Diagnosis not present

## 2020-09-09 DIAGNOSIS — I10 Essential (primary) hypertension: Secondary | ICD-10-CM | POA: Diagnosis not present

## 2020-09-09 DIAGNOSIS — Z1231 Encounter for screening mammogram for malignant neoplasm of breast: Secondary | ICD-10-CM

## 2020-09-09 DIAGNOSIS — E785 Hyperlipidemia, unspecified: Secondary | ICD-10-CM | POA: Diagnosis not present

## 2020-09-09 LAB — HEMOGLOBIN A1C: Hgb A1c MFr Bld: 5.9 % (ref 4.6–6.5)

## 2020-09-09 LAB — LIPID PANEL
Cholesterol: 201 mg/dL — ABNORMAL HIGH (ref 0–200)
HDL: 39.5 mg/dL (ref >39.00)
LDL Cholesterol: 134 mg/dL — ABNORMAL HIGH (ref 0–99)
NonHDL: 161.99
Total CHOL/HDL Ratio: 5
Triglycerides: 138 mg/dL (ref 0.0–149.0)
VLDL: 27.6 mg/dL (ref 0.0–40.0)

## 2020-09-09 MED ORDER — LOSARTAN POTASSIUM 50 MG PO TABS: 50.0000 mg | ORAL_TABLET | Freq: Every day | ORAL | 3 refills | Status: DC

## 2020-09-09 MED ORDER — PANTOPRAZOLE SODIUM 40 MG PO TBEC: 40.0000 mg | DELAYED_RELEASE_TABLET | Freq: Every day | ORAL | 0 refills | Status: DC

## 2020-09-09 NOTE — Assessment & Plan Note (Signed)
No longer on atorvastatin 20 mg, repeat lipid panel pending. Consider refilling once labs return.

## 2020-09-09 NOTE — Assessment & Plan Note (Signed)
Chronic, deteriorated omeprazole 40 mg.   Will trial pantoprazole 40 mg.  Referral placed to GI.

## 2020-09-09 NOTE — Progress Notes (Signed)
Subjective:    Patient ID: [name], female    DOB: [birth year], 55 y.o.   MRN: [id number]  HPI  [name] is a very pleasant 55 y.o. female with a history of migraines, GERD, recurrent cystitis, anxiety and depression, hyperlipidemia who presents today for ED follow up and follow up of chronic conditions. She has not been seen since May 2021.  Numerous ED visits during the month of May 2022, last visit was on 09/04/20 for symptoms of diarrhea. History of constipation, was seen at another ED 11 days prior for constipation, received magnesium citrate during this visit. Exam on 09/04/20 was stable, benign work up and negative SBO or other illness. She was discharged home with Reglan to help with nausea and GI motility, also discussed Miralax use.   Since her last ED visit she has not had a bowel movement in two days. Each times she eats something she feels nauseated, sometimes the smell of food causes her nausea. Also with "severe" generalized/lower abdominal cramping just prior to bowel movement. She tried taking Miralax daily for about one month without improvement. She's also tried stool softeners, Ex-Lax without much improvement. She's not tried Benefiber or Metamucil. Long history of constipation, typically has one bowel movement every 2-3 weeks on average. She's seen GI years and years ago, was told to go on a "gluten free diet", which has "been hard to do". She typically drinks some water daily.   She continues to struggle with GERD symptoms which include esophageal burning and nausea. She's currently taking omeprazole 40 mg ( 2 capsules OTC) for symptoms without improvement. She's tried Nexium in the past without much relief.   She is no longer taking atorvastatin 20 mg for hyperlipidemia or Trazodone for sleep.   Previously following with neurology for migraines/headaches and is managed on Emgality once monthly. Has not seen neurology in about one year.   No longer on losartan 50 mg,  has been out for about one week. She checks BP at home when on losartan 50 mg which runs 120's-130's/80's.   BP Readings from Last 3 Encounters:  09/09/20 (!) 146/88  09/04/20 132/80  08/24/20 (!) 153/92      Review of Systems  Respiratory: Negative for shortness of breath.   Cardiovascular: Negative for chest pain.  Gastrointestinal: Positive for constipation and nausea.       GERD  Neurological: Positive for dizziness and headaches.         Past Medical History:  Diagnosis Date  . Anemia   . BV (bacterial vaginosis) 11/27/2012  . Celiac disease   . Cough due to ACE inhibitor 04/25/2019  . Depression   . Essential hypertension   . Family history of adverse reaction to anesthesia    MOM-HARD TIME WAKING UP  . GERD (gastroesophageal reflux disease)   . Migraine with visual aura    MIGRAINES  . UTI (lower urinary tract infection)     Social History   Socioeconomic History  . Marital status: Married    Spouse name: Jenny Reichmann  . Number of children: 3  . Years of education: 100  . Highest education level: Not on file  Occupational History  . Occupation: Personnel officer  Tobacco Use  . Smoking status: Never Smoker  . Smokeless tobacco: Never Used  Vaping Use  . Vaping Use: Never used  Substance and Sexual Activity  . Alcohol use: No    Alcohol/week: 0.0 standard drinks  . Drug use: No  .  Sexual activity: Yes    Partners: Female    Birth control/protection: Surgical    Comment: INTERCOURSE AGE 52, SEXUAL PARTNERS LEES THAN 6  Other Topics Concern  . Not on file  Social History Narrative   Lives with her husband and their three children, and her daughter's boyfriend.  Her older son's daughter lives there part-time as well.   Social Determinants of Health   Financial Resource Strain: Not on file  Food Insecurity: Not on file  Transportation Needs: Not on file  Physical Activity: Not on file  Stress: Not on file  Social Connections: Not on file   Intimate Partner Violence: Not on file    Past Surgical History:  Procedure Laterality Date  . BLADDER SUSPENSION    . EXPLORATORY LAPAROTOMY    . IUD REMOVAL  07/25/2017   Procedure: INTRAUTERINE DEVICE (IUD) REMOVAL;  Surgeon: Harlin Heys, MD;  Location: [facility] ORS;  Service: Gynecology;;  . LAPAROSCOPIC ASSISTED VAGINAL HYSTERECTOMY Bilateral 07/25/2017   Procedure: LAPAROSCOPIC ASSISTED VAGINAL HYSTERECTOMY WITH BILATERAL [place] OOPHERECTOMY;  Surgeon: Harlin Heys, MD;  Location: [facility] ORS;  Service: Gynecology;  Laterality: Bilateral;  . TUBAL LIGATION      Family History  Problem Relation Age of Onset  . Hypertension Mother   . Stroke Mother   . Diabetes Son 12       type 1  . Hypertension Maternal Grandmother   . Colon cancer Maternal Grandmother   . Breast cancer Maternal Grandmother   . Skin cancer Maternal Grandmother   . Hypertension Maternal Grandfather   . Hypertension Paternal Grandmother   . Diabetes Paternal Grandmother   . Hypertension Paternal Grandfather     Allergies  Allergen Reactions  . Topamax [Topiramate]     "cant function"   . Tramadol     Cant function with it   . Gluten Meal Diarrhea and Nausea Only       . Ondansetron Other (See Comments)    Helps her nausea, but makes HA worse    Current Outpatient Medications on File Prior to Visit  Medication Sig Dispense Refill  . Galcanezumab-gnlm (EMGALITY) 120 MG/ML SOAJ INJECT 120 MG SUBCUTANEOUSLY EVERY 28 (TWENTY-EIGHT) DAYS    . metoCLOPramide (REGLAN) 10 MG tablet Take 1 tablet (10 mg total) by mouth every 8 (eight) hours as needed for nausea or vomiting. 30 tablet 0  . [DISCONTINUED] albuterol (PROVENTIL HFA;VENTOLIN HFA) 108 (90 Base) MCG/ACT inhaler Inhale 2 puffs into the lungs every 4 (four) hours as needed for wheezing or shortness of breath (cough, shortness of breath or wheezing.). (Patient not taking: Reported on 09/01/2018) 1 Inhaler 1  . [DISCONTINUED] atorvastatin  (LIPITOR) 20 MG tablet Take 1 tablet (20 mg total) by mouth every evening. For cholesterol. 90 tablet 3  . [DISCONTINUED] traZODone (DESYREL) 50 MG tablet      No current facility-administered medications on file prior to visit.    BP (!) 146/88   Pulse (!) 108   Temp 98.6 F (37 C) (Temporal)   Ht 5' (1.524 m)   Wt 152 lb (68.9 kg)   LMP  (LMP Unknown)   SpO2 97%   BMI 29.69 kg/m  Objective:   Physical Exam Cardiovascular:     Rate and Rhythm: Normal rate and regular rhythm.  Pulmonary:     Effort: Pulmonary effort is normal.     Breath sounds: Normal breath sounds.  Musculoskeletal:     Cervical back: Neck supple.  Skin:  General: Skin is warm and dry.  Psychiatric:        Mood and Affect: Mood normal.           Assessment & Plan:      This visit occurred during the SARS-CoV-2 public health emergency.  Safety protocols were in place, including screening questions prior to the visit, additional usage of staff PPE, and extensive cleaning of exam room while observing appropriate contact time as indicated for disinfecting solutions.

## 2020-09-09 NOTE — Assessment & Plan Note (Signed)
Chronic, no recent neurology visit. Recommended she set up a follow up appointment for continued Emgality.

## 2020-09-09 NOTE — Patient Instructions (Signed)
Stop by the lab prior to leaving today. I will notify you of your results once received.   Resume losartan 50 mg once daily for blood pressure.  Start pantoprazole 40 mg once daily for heartburn. Do not take omeprazole.  Follow up with neurology for your headaches.  You will be contacted regarding your referral to GI.  Please let us know if you have not been contacted within two weeks.   [facility] to [facility] your [facility].   It was a pleasure to see you today!

## 2020-09-09 NOTE — Assessment & Plan Note (Signed)
Chronic for years, has failed numerous OTC products. We did discuss a healthy diet, good water intake, Benefiber or Metamucil use.   Referral placed to GI. She is also due for colonoscopy.

## 2020-09-09 NOTE — Assessment & Plan Note (Signed)
Uncontrolled today, out of losartan 50 mg for a few weeks, refills provided today. CMP from recent ED visit reviewed.

## 2020-09-10 ENCOUNTER — Encounter: Payer: Self-pay | Admitting: *Deleted

## 2020-09-10 LAB — HEPATITIS C ANTIBODY
Hepatitis C Ab: NONREACTIVE
SIGNAL TO CUT-OFF: 0.01 (ref <1.00)

## 2020-09-11 ENCOUNTER — Ambulatory Visit (INDEPENDENT_AMBULATORY_CARE_PROVIDER_SITE_OTHER): Payer: PRIVATE HEALTH INSURANCE | Admitting: Gastroenterology

## 2020-09-11 ENCOUNTER — Encounter: Payer: Self-pay | Admitting: Gastroenterology

## 2020-09-11 ENCOUNTER — Other Ambulatory Visit: Payer: Self-pay

## 2020-09-11 VITALS — BP 138/93 | HR 96 | Wt 153.4 lb

## 2020-09-11 DIAGNOSIS — R1310 Dysphagia, unspecified: Secondary | ICD-10-CM

## 2020-09-11 DIAGNOSIS — K5900 Constipation, unspecified: Secondary | ICD-10-CM

## 2020-09-11 DIAGNOSIS — Z1211 Encounter for screening for malignant neoplasm of colon: Secondary | ICD-10-CM | POA: Diagnosis not present

## 2020-09-11 MED ORDER — LINACLOTIDE 145 MCG PO CAPS: 145.0000 ug | ORAL_CAPSULE | Freq: Every day | ORAL | 2 refills | Status: DC

## 2020-09-11 MED ORDER — NA SULFATE-K SULFATE-MG SULF 17.5-3.13-1.6 GM/177ML PO SOLN: 1.0000 | Freq: Once | ORAL | 0 refills | Status: AC

## 2020-09-11 NOTE — Progress Notes (Signed)
[name]  [place], [place] [address]  Main: [phone]  Fax: [phone]   Gastroenterology Consultation  Referring Provider:     [name], NP Primary Care Physician:  [name], NP Reason for Consultation:     Constipation, dysphagia, reflux        HPI:    Chief complaint: Constipation  [name] is a 55 y.o. y/o female referred for consultation & management  by Dr. Carlis Abbott, Leticia Penna, NP.  Patient reports suffering from chronic constipation and has been taking MiraLAX daily but without good results.  States sometimes goes 3 to 4 weeks without a bowel movement and has previously had to go to the ER for this and received a colonoscopy prep before she could have a bowel movement.  Reports having a colonoscopy previously.  Colonoscopy reports not available but primary care notes from 2013 states that patient has had a colonoscopy in the last 1 to 2 years.  Therefore, it appears that her previous colonoscopies have been around 10 years ago.  She does not recall the results but states that she was advised to have another one done in a year but never got this done.  I did ask her if this was due to poor prep but she does not recall.  Does not know if any polyps were found.  No family history of colon cancer.  No blood in stool.  Does report intermittent nausea and vomiting and states that she has celiac disease for which I do not see any blood work results or pathology results.  Patient reports having an upper endoscopy at the same time as her previous colonoscopy.  Does consume gluten from time to time.  Is also reporting dysphagia.  Reports having an episode 3 to 4 weeks ago where food got stuck in her mid chest and she had to relax and take deep breaths before it passed.  No dysphagia to liquids.  Past Medical History:  Diagnosis Date  . Anemia   . BV (bacterial vaginosis) 11/27/2012  . Celiac disease   . Cough due to ACE  inhibitor 04/25/2019  . Depression   . Essential hypertension   . Family history of adverse reaction to anesthesia    MOM-HARD TIME WAKING UP  . GERD (gastroesophageal reflux disease)   . Migraine with visual aura    MIGRAINES  . UTI (lower urinary tract infection)     Past Surgical History:  Procedure Laterality Date  . BLADDER SUSPENSION    . EXPLORATORY LAPAROTOMY    . IUD REMOVAL  07/25/2017   Procedure: INTRAUTERINE DEVICE (IUD) REMOVAL;  Surgeon: Harlin Heys, MD;  Location: [facility] ORS;  Service: Gynecology;;  . LAPAROSCOPIC ASSISTED VAGINAL HYSTERECTOMY Bilateral 07/25/2017   Procedure: LAPAROSCOPIC ASSISTED VAGINAL HYSTERECTOMY WITH BILATERAL [place] OOPHERECTOMY;  Surgeon: Harlin Heys, MD;  Location: [facility] ORS;  Service: Gynecology;  Laterality: Bilateral;  . TUBAL LIGATION      Prior to Admission medications   Medication Sig Start Date End Date Taking? Authorizing Provider  aspirin-acetaminophen-caffeine (EXCEDRIN MIGRAINE) [phone] MG tablet Take by mouth every 6 (six) hours as needed for headache.   Yes [provider]  ibuprofen (ADVIL) 200 MG tablet Take by mouth every 6 (six) hours as needed.   Yes [provider]  losartan (COZAAR) 50 MG tablet Take 1 tablet (50 mg total) by mouth daily. For blood pressure. 09/09/20  Yes [name], NP  metoCLOPramide (  REGLAN) 10 MG tablet Take 1 tablet (10 mg total) by mouth every 8 (eight) hours as needed for nausea or vomiting. 09/04/20 09/04/21 Yes Vladimir Crofts, MD  Na Sulfate-K Sulfate-Mg Sulf 17.5-3.13-1.6 GM/177ML SOLN Take 1 kit by mouth once for 1 dose. 09/11/20 09/11/20 Yes Virgel Manifold, MD  pantoprazole (PROTONIX) 40 MG tablet Take 1 tablet (40 mg total) by mouth daily. For heartburn. 09/09/20  Yes [name], NP  Galcanezumab-gnlm (EMGALITY) 120 MG/ML SOAJ INJECT 120 MG SUBCUTANEOUSLY EVERY 28 (TWENTY-EIGHT) DAYS Patient not taking: Reported on 09/11/2020 10/18/19   [provider]  albuterol (PROVENTIL HFA;VENTOLIN HFA) 108 (90 Base) MCG/ACT inhaler Inhale 2 puffs into the lungs every 4 (four) hours as needed for wheezing or shortness of breath (cough, shortness of breath or wheezing.). Patient not taking: Reported on 09/01/2018 07/04/18 12/14/18  Robyn Haber, MD  atorvastatin (LIPITOR) 20 MG tablet Take 1 tablet (20 mg total) by mouth every evening. For cholesterol. 03/02/19 07/02/20  [name], NP  traZODone (DESYREL) 50 MG tablet  08/15/19 07/02/20  [provider]    Family History  Problem Relation Age of Onset  . Hypertension Mother   . Stroke Mother   . Diabetes Son 12       type 1  . Hypertension Maternal Grandmother   . Colon cancer Maternal Grandmother   . Breast cancer Maternal Grandmother   . Skin cancer Maternal Grandmother   . Hypertension Maternal Grandfather   . Hypertension Paternal Grandmother   . Diabetes Paternal Grandmother   . Hypertension Paternal Grandfather      Social History   Tobacco Use  . Smoking status: Never Smoker  . Smokeless tobacco: Never Used  Vaping Use  . Vaping Use: Never used  Substance Use Topics  . Alcohol use: No    Alcohol/week: 0.0 standard drinks  . Drug use: No    Allergies as of 09/11/2020 - Review Complete 09/11/2020  Allergen Reaction Noted  . Topamax [topiramate]  03/19/2019  . Tramadol  03/19/2019  . Gluten meal Diarrhea and Nausea Only 11/27/2012  . Ondansetron Other (See Comments) 04/28/2012    Review of Systems:    All systems reviewed and negative except where noted in HPI.   Physical Exam:  BP (!) 138/93   Pulse 96   Wt 153 lb 6.4 oz (69.6 kg)   LMP  (LMP Unknown)   BMI 29.96 kg/m  No LMP recorded (lmp unknown). Patient has had a hysterectomy. Psych:  Alert and cooperative. Normal mood and affect. General:   Alert,  Well-developed, well-nourished, pleasant and cooperative in NAD Head:  Normocephalic and atraumatic. Eyes:  Sclera clear, no icterus.    Conjunctiva pink. Ears:  Normal auditory acuity. Nose:  No deformity, discharge, or lesions. Mouth:  No deformity or lesions,oropharynx pink & moist. Neck:  Supple; no masses or thyromegaly. Abdomen:  Normal bowel sounds.  No bruits.  Soft, non-tender and non-distended without masses, hepatosplenomegaly or hernias noted.  No guarding or rebound tenderness.    Msk:  Symmetrical without gross deformities. Good, equal movement & strength bilaterally. Pulses:  Normal pulses noted. Extremities:  No clubbing or edema.  No cyanosis. Neurologic:  Alert and oriented x3;  grossly normal neurologically. Skin:  Intact without significant lesions or rashes. No jaundice. Lymph Nodes:  No significant cervical adenopathy. Psych:  Alert and cooperative. Normal mood and affect.   Labs: CBC    Component Value Date/Time   WBC 6.4 09/04/2020 1219  RBC 4.80 09/04/2020 1219   HGB 13.0 09/04/2020 1219   HGB 15.4 11/26/2017 1115   HCT 40.3 09/04/2020 1219   HCT 45.8 11/26/2017 1115   PLT 252 09/04/2020 1219   PLT 215 11/26/2017 1115   MCV 84.0 09/04/2020 1219   MCV 95 11/26/2017 1115   MCH 27.1 09/04/2020 1219   MCHC 32.3 09/04/2020 1219   RDW 15.1 09/04/2020 1219   RDW 14.3 11/26/2017 1115   LYMPHSABS 1.4 08/24/2020 1857   MONOABS 0.3 08/24/2020 1857   EOSABS 0.2 08/24/2020 1857   BASOSABS 0.0 08/24/2020 1857   CMP     Component Value Date/Time   NA 140 09/04/2020 1219   NA 139 11/26/2017 1115   K 3.6 09/04/2020 1219   CL 110 09/04/2020 1219   CO2 23 09/04/2020 1219   GLUCOSE 90 09/04/2020 1219   BUN 15 09/04/2020 1219   BUN 17 11/26/2017 1115   CREATININE 0.56 09/04/2020 1219   CREATININE 0.65 05/22/2014 0902   CALCIUM 9.1 09/04/2020 1219   PROT 7.0 09/04/2020 1219   PROT 6.7 11/26/2017 1115   ALBUMIN 3.8 09/04/2020 1219   ALBUMIN 4.1 11/26/2017 1115   AST 16 09/04/2020 1219   ALT 11 09/04/2020 1219   ALKPHOS 94 09/04/2020 1219   BILITOT 0.6 09/04/2020 1219   BILITOT 0.3  11/26/2017 1115   GFRNONAA >60 09/04/2020 1219   GFRAA >60 04/09/2019 1110    Imaging Studies: DG Abdomen 1 View  Result Date: 09/04/2020 CLINICAL DATA:  Constipation and diarrhea EXAM: ABDOMEN - 1 VIEW COMPARISON:  08/24/2020 FINDINGS: Nonobstructed bowel-gas pattern with mild stool in the colon. No radiopaque calculi. Phleboliths in the pelvis. IMPRESSION: Negative.  Mild stool burden. Electronically Signed   By: Donavan Foil M.D.   On: 09/04/2020 14:42   DG Abdomen 1 View  Result Date: 08/24/2020 CLINICAL DATA:  2-3 weeks of constipation EXAM: ABDOMEN - 1 VIEW COMPARISON:  July 30, 2011. FINDINGS: The bowel gas pattern is normal. Moderate volume of formed stool throughout the colon. IMPRESSION: Nonobstructive bowel gas pattern. Moderate volume of formed stool throughout the colon. Electronically Signed   By: Dahlia Bailiff MD   On: 08/24/2020 21:15    Assessment and Plan:   [name] is a 55 y.o. y/o female has been referred for constipation, and is also reporting dysphagia  EGD indicated for further evaluation of dysphagia Obtain biopsies of the small bowel at that time to evaluate for patient's self-reported history of celiac disease  It appears last colonoscopy may have been about 10 years ago based on primary care notes from 2013 in her chart.  Colonoscopy indicated for screening.  I have discussed alternative options, risks & benefits,  which include, but are not limited to, bleeding, infection, perforation,respiratory complication & drug reaction.  The patient agrees with this plan & written consent will be obtained.    Start Linzess for her chronic constipation that is not responding to MiraLAX or high-fiber diet  I have discussed alternative options, risks & benefits,  which include, but are not limited to, bleeding, infection, perforation,respiratory complication & drug reaction.  The patient agrees with this plan & written consent will be obtained.    We will try to  obtain her previous EGD and colonoscopy records   Dr Ariel [place]  Speech recognition software was used to dictate the above note.

## 2020-09-15 ENCOUNTER — Telehealth: Payer: Self-pay

## 2020-09-15 NOTE — Telephone Encounter (Signed)
PA has been submitted via [email or web address], awaiting response  Prior Auth (EOC) ID: [id number] Drug/Service Name: [facility] [place]  Patient: [name] Date Requested: 09/15/2020 10:15:43 AM    MemberID: [id number] DOB: 1965/06/24

## 2020-09-16 ENCOUNTER — Encounter: Payer: Self-pay | Admitting: Gastroenterology

## 2020-09-16 ENCOUNTER — Telehealth: Payer: Self-pay

## 2020-09-16 NOTE — Telephone Encounter (Signed)
Left message on voicemail and sent msg via mychart notifying pt.   Sorry for any inconvenience this may cause, but I will need to move your appointment. If you want to stick with a Monday, we have June 20th and 27th available. If it does not matter and you would like to pick the next available, it would be September 25, 2020. Please let me know what would work for you.

## 2020-09-16 NOTE — Telephone Encounter (Signed)
Linzess has been approved from 09/15/2020 to 09/14/2021

## 2020-09-22 ENCOUNTER — Encounter: Admission: RE | Payer: Self-pay | Source: Home / Self Care

## 2020-09-22 ENCOUNTER — Ambulatory Visit
Admission: RE | Admit: 2020-09-22 | Payer: PRIVATE HEALTH INSURANCE | Source: Home / Self Care | Admitting: Gastroenterology

## 2020-09-22 SURGERY — COLONOSCOPY WITH PROPOFOL
Anesthesia: General

## 2020-09-23 ENCOUNTER — Ambulatory Visit: Payer: PRIVATE HEALTH INSURANCE | Admitting: Primary Care

## 2020-09-23 DIAGNOSIS — Z0289 Encounter for other administrative examinations: Secondary | ICD-10-CM

## 2020-12-05 ENCOUNTER — Other Ambulatory Visit: Payer: Self-pay | Admitting: Primary Care

## 2020-12-05 DIAGNOSIS — K219 Gastro-esophageal reflux disease without esophagitis: Secondary | ICD-10-CM

## 2020-12-05 NOTE — Telephone Encounter (Signed)
Left message to return call to our office.  

## 2020-12-05 NOTE — Telephone Encounter (Signed)
Is the pantoprazole 40 mg medication that we initiated in May for food getting stuck in her throat helping? I see that she saw GI.   Does she want a refill? I don't recommend it if it's not helping.

## 2020-12-08 NOTE — Telephone Encounter (Signed)
Left message to return call to our office.  

## 2020-12-22 NOTE — Telephone Encounter (Signed)
Have called x 3 no answer no v/m

## 2021-01-01 ENCOUNTER — Ambulatory Visit (HOSPITAL_COMMUNITY)
Admission: EM | Admit: 2021-01-01 | Discharge: 2021-01-01 | Disposition: A | Discharge status: Home / Self Care | Payer: PRIVATE HEALTH INSURANCE | Attending: Internal Medicine | Admitting: Internal Medicine

## 2021-01-01 ENCOUNTER — Other Ambulatory Visit: Payer: Self-pay

## 2021-01-01 ENCOUNTER — Encounter (HOSPITAL_COMMUNITY): Payer: Self-pay

## 2021-01-01 DIAGNOSIS — R058 Other specified cough: Secondary | ICD-10-CM | POA: Insufficient documentation

## 2021-01-01 DIAGNOSIS — N760 Acute vaginitis: Secondary | ICD-10-CM | POA: Diagnosis present

## 2021-01-01 LAB — POCT URINALYSIS DIPSTICK, ED / UC
Bilirubin Urine: NEGATIVE
Glucose, UA: NEGATIVE mg/dL
Hgb urine dipstick: NEGATIVE
Ketones, ur: NEGATIVE mg/dL
Leukocytes,Ua: NEGATIVE
Nitrite: POSITIVE — AB
Protein, ur: NEGATIVE mg/dL
Specific Gravity, Urine: 1.02 (ref 1.005–1.030)
Urobilinogen, UA: 0.2 mg/dL (ref 0.0–1.0)
pH: 7 (ref 5.0–8.0)

## 2021-01-01 MED ORDER — METRONIDAZOLE 500 MG PO TABS: 500.0000 mg | ORAL_TABLET | Freq: Two times a day (BID) | ORAL | 0 refills | Status: DC

## 2021-01-01 MED ORDER — ALBUTEROL SULFATE HFA 108 (90 BASE) MCG/ACT IN AERS: 1.0000 | INHALATION_SPRAY | Freq: Four times a day (QID) | RESPIRATORY_TRACT | 0 refills | Status: DC | PRN

## 2021-01-01 MED ORDER — GUAIFENESIN ER 600 MG PO TB12: 600.0000 mg | ORAL_TABLET | Freq: Two times a day (BID) | ORAL | 0 refills | Status: AC

## 2021-01-01 MED ORDER — BENZONATATE 100 MG PO CAPS: 100.0000 mg | ORAL_CAPSULE | Freq: Three times a day (TID) | ORAL | 0 refills | Status: DC | PRN

## 2021-01-01 NOTE — ED Triage Notes (Signed)
Pt presents with congestion, vaginal odor, and vaginal itching x1 month

## 2021-01-01 NOTE — Discharge Instructions (Addendum)
Take medications as prescribed We will call you with recommendations if labs are abnormal Return to urgent care if symptoms worsen or if symptoms are persistent.

## 2021-01-02 LAB — CERVICOVAGINAL ANCILLARY ONLY
Bacterial Vaginitis (gardnerella): POSITIVE — AB
Candida Glabrata: NEGATIVE
Candida Vaginitis: NEGATIVE
Comment: NEGATIVE
Comment: NEGATIVE
Comment: NEGATIVE

## 2021-01-02 NOTE — ED Provider Notes (Signed)
[place]    CSN: [id number] Arrival date & time: 01/01/21  1622      History   Chief Complaint Chief Complaint  Patient presents with   Nasal Congestion   vaginal odor   Vaginal Itching    HPI [name] is a 55 y.o. female comes to the urgent care with 3 week history of nasal congestion, sore throat and nonproductive cough.  Patient's symptoms started with sore throat and a cough.  The sore throat has resolved.  Patient continues to have a cough.  Cough is dry.  Patient admits to having some wheezing and chest tightness.  No fever or chills.  No chest pain.  Patient has 1 month history of slight malodorous vaginal discharge.  It is associated with vaginal itching.  No dysuria urgency or frequency.  No abdominal pain.  Patient is in a monogamous relationship and engages in unprotected sexual intercourse.  No dyspareunia.  HPI  Past Medical History:  Diagnosis Date   Anemia    BV (bacterial vaginosis) 11/27/2012   Celiac disease    Cough due to ACE inhibitor 04/25/2019   Depression    Essential hypertension    Family history of adverse reaction to anesthesia    MOM-HARD TIME WAKING UP   GERD (gastroesophageal reflux disease)    Migraine with visual aura    MIGRAINES   UTI (lower urinary tract infection)     Patient Active Problem List   Diagnosis Date Noted   GERD (gastroesophageal reflux disease) 04/25/2019   Hyperlipidemia 03/02/2019   Left facial numbness 02/23/2019   Urinary incontinence 02/18/2019   Essential hypertension 02/18/2019   Recurrent cystitis 11/27/2012   Migraine with visual aura 09/19/2011   Adjustment disorder with mixed anxiety and depressed mood 09/19/2011   Chronic constipation 06/25/2011    Past Surgical History:  Procedure Laterality Date   BLADDER SUSPENSION     EXPLORATORY LAPAROTOMY     IUD REMOVAL  07/25/2017   Procedure: INTRAUTERINE DEVICE (IUD) REMOVAL;  Surgeon: Harlin Heys, MD;  Location: [facility] ORS;   Service: Gynecology;;   LAPAROSCOPIC ASSISTED VAGINAL HYSTERECTOMY Bilateral 07/25/2017   Procedure: LAPAROSCOPIC ASSISTED VAGINAL HYSTERECTOMY WITH BILATERAL [place];  Surgeon: Harlin Heys, MD;  Location: [facility] ORS;  Service: Gynecology;  Laterality: Bilateral;   TUBAL LIGATION      OB History     Gravida  3   Para  3   Term  3   Preterm      AB      Living  3      SAB      IAB      Ectopic      Multiple      Live Births  3            Home Medications    Prior to Admission medications   Medication Sig Start Date End Date Taking? Authorizing Provider  albuterol (VENTOLIN HFA) 108 (90 Base) MCG/ACT inhaler Inhale 1-2 puffs into the lungs every 6 (six) hours as needed for wheezing or shortness of breath. 01/01/21  Yes Ermine Spofford, Myrene Galas, MD  benzonatate (TESSALON) 100 MG capsule Take 1 capsule (100 mg total) by mouth 3 (three) times daily as needed for cough. 01/01/21  Yes Sivan Quast, Myrene Galas, MD  guaiFENesin (MUCINEX) 600 MG 12 hr tablet Take 1 tablet (600 mg total) by mouth 2 (two) times daily for 10 days. 01/01/21 01/11/21 Yes Milos Milligan, Myrene Galas, MD  metroNIDAZOLE (FLAGYL) 500  MG tablet Take 1 tablet (500 mg total) by mouth 2 (two) times daily. 01/01/21  Yes Jairus Tonne, Myrene Galas, MD  aspirin-acetaminophen-caffeine (EXCEDRIN MIGRAINE) [phone] MG tablet Take by mouth every 6 (six) hours as needed for headache.    [provider]  ibuprofen (ADVIL) 200 MG tablet Take by mouth every 6 (six) hours as needed.    [provider]  losartan (COZAAR) 50 MG tablet Take 1 tablet (50 mg total) by mouth daily. For blood pressure. 09/09/20   [name], NP  atorvastatin (LIPITOR) 20 MG tablet Take 1 tablet (20 mg total) by mouth every evening. For cholesterol. 03/02/19 07/02/20  [name], NP  traZODone (DESYREL) 50 MG tablet  08/15/19 07/02/20  [provider]    Family History Family History  Problem Relation Age of Onset    Hypertension Mother    Stroke Mother    Diabetes Son 24       type 1   Hypertension Maternal Grandmother    Colon cancer Maternal Grandmother    Breast cancer Maternal Grandmother    Skin cancer Maternal Grandmother    Hypertension Maternal Grandfather    Hypertension Paternal Grandmother    Diabetes Paternal Grandmother    Hypertension Paternal Grandfather     Social History Social History   Tobacco Use   Smoking status: Never   Smokeless tobacco: Never  Vaping Use   Vaping Use: Never used  Substance Use Topics   Alcohol use: No    Alcohol/week: 0.0 standard drinks   Drug use: No     Allergies   Topamax [topiramate], Tramadol, Gluten meal, and Ondansetron   Review of Systems Review of Systems  Respiratory: Negative.    Cardiovascular: Negative.   Gastrointestinal: Negative.   Genitourinary:  Positive for vaginal discharge. Negative for dysuria, urgency and vaginal pain.  Neurological: Negative.     Physical Exam Triage Vital Signs ED Triage Vitals  Enc Vitals Group     BP 01/01/21 1740 (!) 158/97     Pulse Rate 01/01/21 1740 80     Resp --      Temp 01/01/21 1740 98.8 F (37.1 C)     Temp Source 01/01/21 1740 Oral     SpO2 01/01/21 1740 97 %     Weight --      Height --      Head Circumference --      Peak Flow --      Pain Score 01/01/21 1755 0     Pain Loc --      Pain Edu? --      Excl. in [place]? --    No data found.  Updated Vital Signs BP (!) 158/97 (BP Location: Right Arm)   Pulse 80   Temp 98.8 F (37.1 C) (Oral)   LMP  (LMP Unknown)   SpO2 97%   Visual Acuity Right Eye Distance:   Left Eye Distance:   Bilateral Distance:    Right Eye Near:   Left Eye Near:    Bilateral Near:     Physical Exam Vitals and nursing note reviewed.  Constitutional:      Appearance: Normal appearance.  Cardiovascular:     Rate and Rhythm: Normal rate and regular rhythm.  Pulmonary:     Effort: Pulmonary effort is normal.     Breath sounds: Normal  breath sounds.  Abdominal:     General: Bowel sounds are normal.     Palpations: Abdomen is soft.  Neurological:     Mental Status: She is alert.     UC Treatments / Results  Labs (all labs ordered are listed, but only abnormal results are displayed) Labs Reviewed  POCT URINALYSIS DIPSTICK, ED / UC - Abnormal; Notable for the following components:      Result Value   Nitrite POSITIVE (*)    All other components within normal limits  CERVICOVAGINAL ANCILLARY ONLY    EKG   Radiology No results found.  Procedures Procedures (including critical care time)  Medications Ordered in UC Medications - No data to display  Initial Impression / Assessment and Plan / UC Course  I have reviewed the triage vital signs and the nursing notes.  Pertinent labs & imaging results that were available during my care of the patient were reviewed by me and considered in my medical decision making (see chart for details).     1.  Postviral cough syndrome: Tessalon Perles as needed for cough Albuterol inhaler as needed for shortness of breath Mucinex twice daily as needed Return precautions given  2.  Acute vaginitis likely bacterial vaginosis: Cervicovaginal swab has been sent for BV/vaginal yeast testing Point-of-care urinalysis is negative for urinary tract infection Metronidazole 500 mg twice daily for 7 days We will call patient with recommendations if labs are abnormal. Final Clinical Impressions(s) / UC Diagnoses   Final diagnoses:  Acute vaginitis  Post-viral cough syndrome     Discharge Instructions      Take medications as prescribed We will call you with recommendations if labs are abnormal Return to urgent care if symptoms worsen or if symptoms are persistent.   ED Prescriptions     Medication Sig Dispense Auth. Provider   metroNIDAZOLE (FLAGYL) 500 MG tablet Take 1 tablet (500 mg total) by mouth 2 (two) times daily. 14 tablet Connie Hilgert, Myrene Galas, MD   benzonatate  (TESSALON) 100 MG capsule Take 1 capsule (100 mg total) by mouth 3 (three) times daily as needed for cough. 21 capsule Amali Uhls, Myrene Galas, MD   albuterol (VENTOLIN HFA) 108 (90 Base) MCG/ACT inhaler Inhale 1-2 puffs into the lungs every 6 (six) hours as needed for wheezing or shortness of breath. 18 g Chase Picket, MD   guaiFENesin (MUCINEX) 600 MG 12 hr tablet Take 1 tablet (600 mg total) by mouth 2 (two) times daily for 10 days. 20 tablet Clancy Leiner, Myrene Galas, MD      PDMP not reviewed this encounter.   Chase Picket, MD 01/02/21 1159

## 2021-02-16 ENCOUNTER — Ambulatory Visit (HOSPITAL_COMMUNITY)
Admission: EM | Admit: 2021-02-16 | Discharge: 2021-02-16 | Disposition: A | Discharge status: Home / Self Care | Payer: PRIVATE HEALTH INSURANCE | Attending: Family Medicine | Admitting: Family Medicine

## 2021-02-16 ENCOUNTER — Encounter (HOSPITAL_COMMUNITY): Payer: Self-pay

## 2021-02-16 DIAGNOSIS — J029 Acute pharyngitis, unspecified: Secondary | ICD-10-CM | POA: Diagnosis not present

## 2021-02-16 DIAGNOSIS — J069 Acute upper respiratory infection, unspecified: Secondary | ICD-10-CM

## 2021-02-16 MED ORDER — PROMETHAZINE-DM 6.25-15 MG/5ML PO SYRP: 5.0000 mL | ORAL_SOLUTION | Freq: Four times a day (QID) | ORAL | 0 refills | Status: DC | PRN

## 2021-02-16 NOTE — ED Provider Notes (Signed)
[name]   [id number] 02/16/21 Arrival Time: 3832  ASSESSMENT & PLAN:  1. Viral URI with cough   2. Sore throat    Likely will begin to feel better within the next few days. Discussed. OTC symptom care as needed.  Meds ordered this encounter  Medications   promethazine-dextromethorphan (PROMETHAZINE-DM) 6.25-15 MG/5ML syrup    Sig: Take 5 mLs by mouth 4 (four) times daily as needed for cough.    Dispense:  118 mL    Refill:  0     Follow-up Information     [name], NP.   Specialty: Internal Medicine Why: If worsening or failing to improve as anticipated. Contact information: [place] [address] [phone]                 Reviewed expectations re: course of current medical issues. Questions answered. Outlined signs and symptoms indicating need for more acute intervention. Understanding verbalized. After Visit Summary given.   SUBJECTIVE: History from: patient. [name] is a 55 y.o. female who reports: mild ST, coughing, muscle aches; abrupt onset; x 3-4 days. Denies: fever. Normal PO intake without n/v/d.   OBJECTIVE:  Vitals:   02/16/21 1126  BP: (!) 143/91  Pulse: 82  Resp: 16  Temp: 98.3 F (36.8 C)  TempSrc: Oral  SpO2: 98%    General appearance: alert; no distress Eyes: PERRLA; EOMI; conjunctiva normal HENT: [place]; AT; with mild nasal congestion; throat irritated Neck: supple  Lungs: speaks full sentences without difficulty; unlabored; CTAB Extremities: no edema Skin: warm and dry Neurologic: normal gait Psychological: alert and cooperative; normal mood and affect   Allergies  Allergen Reactions   Topamax [Topiramate]     "cant function"    Tramadol     Cant function with it    Gluten Meal Diarrhea and Nausea Only        Ondansetron Other (See Comments)    Helps her nausea, but makes HA worse    Past Medical History:  Diagnosis Date   Anemia    BV (bacterial vaginosis)  11/27/2012   Celiac disease    Cough due to ACE inhibitor 04/25/2019   Depression    Essential hypertension    Family history of adverse reaction to anesthesia    MOM-HARD TIME WAKING UP   GERD (gastroesophageal reflux disease)    Migraine with visual aura    MIGRAINES   UTI (lower urinary tract infection)    Social History   Socioeconomic History   Marital status: Married    Spouse name: [name]   Number of children: 3   Years of education: 12   Highest education level: Not on file  Occupational History   Occupation: Personnel officer  Tobacco Use   Smoking status: Never   Smokeless tobacco: Never  Vaping Use   Vaping Use: Never used  Substance and Sexual Activity   Alcohol use: No    Alcohol/week: 0.0 standard drinks   Drug use: No   Sexual activity: Yes    Partners: Female    Birth control/protection: Surgical    Comment: INTERCOURSE AGE 81, SEXUAL PARTNERS LEES THAN 6  Other Topics Concern   Not on file  Social History Narrative   Lives with her husband and their three children, and her daughter's boyfriend.  Her older son's daughter lives there part-time as well.   Social Determinants of Health   Financial Resource Strain: Not on file  Food Insecurity:  Not on file  Transportation Needs: Not on file  Physical Activity: Not on file  Stress: Not on file  Social Connections: Not on file  Intimate Partner Violence: Not on file   Family History  Problem Relation Age of Onset   Hypertension Mother    Stroke Mother    Diabetes Son 57       type 1   Hypertension Maternal Grandmother    Colon cancer Maternal Grandmother    Breast cancer Maternal Grandmother    Skin cancer Maternal Grandmother    Hypertension Maternal Grandfather    Hypertension Paternal Grandmother    Diabetes Paternal Grandmother    Hypertension Paternal Grandfather    Past Surgical History:  Procedure Laterality Date   BLADDER SUSPENSION     EXPLORATORY LAPAROTOMY     IUD  REMOVAL  07/25/2017   Procedure: INTRAUTERINE DEVICE (IUD) REMOVAL;  Surgeon: Harlin Heys, MD;  Location: [facility] ORS;  Service: Gynecology;;   LAPAROSCOPIC ASSISTED VAGINAL HYSTERECTOMY Bilateral 07/25/2017   Procedure: LAPAROSCOPIC ASSISTED VAGINAL HYSTERECTOMY WITH BILATERAL [place];  Surgeon: Harlin Heys, MD;  Location: [facility] ORS;  Service: Gynecology;  Laterality: Bilateral;   TUBAL LIGATION       Vanessa Kick, MD 02/16/21 1236

## 2021-02-16 NOTE — ED Triage Notes (Signed)
Pt states that on Thursday she had pain in her lt leg that has continued. Pt states that on Wednesday she has pain in her lt arm and now has pain in both arms. Pt started with a cough with clear sputum on Wednesday. Pt states that she has diarrhea that started on Saturday. Pt states that while sitting in the lobby her back is sore. Pt states that she has been exposed to flu.

## 2021-05-28 ENCOUNTER — Other Ambulatory Visit: Payer: Self-pay

## 2021-05-28 ENCOUNTER — Ambulatory Visit (HOSPITAL_COMMUNITY)
Admission: EM | Admit: 2021-05-28 | Discharge: 2021-05-28 | Disposition: A | Discharge status: Home / Self Care | Payer: PRIVATE HEALTH INSURANCE | Attending: Physician Assistant | Admitting: Physician Assistant

## 2021-05-28 ENCOUNTER — Encounter (HOSPITAL_COMMUNITY): Payer: Self-pay

## 2021-05-28 DIAGNOSIS — R051 Acute cough: Secondary | ICD-10-CM | POA: Insufficient documentation

## 2021-05-28 DIAGNOSIS — Z20822 Contact with and (suspected) exposure to covid-19: Secondary | ICD-10-CM | POA: Diagnosis not present

## 2021-05-28 DIAGNOSIS — J069 Acute upper respiratory infection, unspecified: Secondary | ICD-10-CM | POA: Diagnosis not present

## 2021-05-28 DIAGNOSIS — R112 Nausea with vomiting, unspecified: Secondary | ICD-10-CM | POA: Diagnosis not present

## 2021-05-28 DIAGNOSIS — G44209 Tension-type headache, unspecified, not intractable: Secondary | ICD-10-CM | POA: Insufficient documentation

## 2021-05-28 LAB — POC INFLUENZA A AND B ANTIGEN (URGENT CARE ONLY)
INFLUENZA A ANTIGEN, POC: NEGATIVE
INFLUENZA B ANTIGEN, POC: NEGATIVE

## 2021-05-28 LAB — SARS CORONAVIRUS 2 (TAT 6-24 HRS): SARS Coronavirus 2: NEGATIVE

## 2021-05-28 MED ORDER — PROMETHAZINE HCL 12.5 MG PO TABS: 12.5000 mg | ORAL_TABLET | Freq: Three times a day (TID) | ORAL | 0 refills | Status: DC | PRN

## 2021-05-28 MED ORDER — BENZONATATE 100 MG PO CAPS: 100.0000 mg | ORAL_CAPSULE | Freq: Three times a day (TID) | ORAL | 0 refills | Status: DC | PRN

## 2021-05-28 NOTE — ED Provider Notes (Signed)
[facility]    CSN: [id number] Arrival date & time: 05/28/21  1275      History   Chief Complaint No chief complaint on file.   HPI [name] is a 56 y.o. female.   Patient presents today with a 3-day history of URI symptoms.  She reports symptoms began as a headache that is similar but not identical to her previous migraines.  She then developed nausea/vomiting, nasal congestion, cough, body aches, fatigue.  Reports headache is mild and rated 3 on a 0-10 pain scale.  She denies any chest pain, shortness of breath, dizziness, syncope.  Reports household sick contacts with similar symptoms that they improved very quickly and she is continue to have symptoms.  She has tried NyQuil without improvement of symptoms.  She is up-to-date on COVID and influenza vaccines.  She has not had COVID in the past.  She denies any recent antibiotic use.  She denies history of smoking.  She denies history of allergies, asthma, COPD but has required albuterol inhaler during acute illness in the past.  She has missed work for several days as result of symptoms.   Past Medical History:  Diagnosis Date   Anemia    BV (bacterial vaginosis) 11/27/2012   Celiac disease    Cough due to ACE inhibitor 04/25/2019   Depression    Essential hypertension    Family history of adverse reaction to anesthesia    MOM-HARD TIME WAKING UP   GERD (gastroesophageal reflux disease)    Migraine with visual aura    MIGRAINES   UTI (lower urinary tract infection)     Patient Active Problem List   Diagnosis Date Noted   GERD (gastroesophageal reflux disease) 04/25/2019   Hyperlipidemia 03/02/2019   Left facial numbness 02/23/2019   Urinary incontinence 02/18/2019   Essential hypertension 02/18/2019   Recurrent cystitis 11/27/2012   Migraine with visual aura 09/19/2011   Adjustment disorder with mixed anxiety and depressed mood 09/19/2011   Chronic constipation 06/25/2011    Past Surgical History:   Procedure Laterality Date   BLADDER SUSPENSION     EXPLORATORY LAPAROTOMY     IUD REMOVAL  07/25/2017   Procedure: INTRAUTERINE DEVICE (IUD) REMOVAL;  Surgeon: Harlin Heys, MD;  Location: [facility] ORS;  Service: Gynecology;;   LAPAROSCOPIC ASSISTED VAGINAL HYSTERECTOMY Bilateral 07/25/2017   Procedure: LAPAROSCOPIC ASSISTED VAGINAL HYSTERECTOMY WITH BILATERAL [place];  Surgeon: Harlin Heys, MD;  Location: [facility] ORS;  Service: Gynecology;  Laterality: Bilateral;   TUBAL LIGATION      OB History     Gravida  3   Para  3   Term  3   Preterm      AB      Living  3      SAB      IAB      Ectopic      Multiple      Live Births  3            Home Medications    Prior to Admission medications   Medication Sig Start Date End Date Taking? Authorizing Provider  promethazine (PHENERGAN) 12.5 MG tablet Take 1 tablet (12.5 mg total) by mouth every 8 (eight) hours as needed for nausea or vomiting. 05/28/21  Yes Ikeem Cleckler K, PA-C  albuterol (VENTOLIN HFA) 108 (90 Base) MCG/ACT inhaler Inhale 1-2 puffs into the lungs every 6 (six) hours as needed for wheezing or shortness of breath. 01/01/21   Jamse Arn  O, MD  aspirin-acetaminophen-caffeine (EXCEDRIN MIGRAINE) [phone] MG tablet Take by mouth every 6 (six) hours as needed for headache.    [provider]  benzonatate (TESSALON) 100 MG capsule Take 1 capsule (100 mg total) by mouth 3 (three) times daily as needed for cough. 05/28/21   Mosi Hannold, Derry Skill, PA-C  ibuprofen (ADVIL) 200 MG tablet Take by mouth every 6 (six) hours as needed.    [provider]  losartan (COZAAR) 50 MG tablet Take 1 tablet (50 mg total) by mouth daily. For blood pressure. 09/09/20   [name], NP  atorvastatin (LIPITOR) 20 MG tablet Take 1 tablet (20 mg total) by mouth every evening. For cholesterol. 03/02/19 07/02/20  [name], NP  traZODone (DESYREL) 50 MG tablet  08/15/19 07/02/20  [provider]    Family History Family History  Problem Relation Age of Onset   Hypertension Mother    Stroke Mother    Diabetes Son 5       type 1   Hypertension Maternal Grandmother    Colon cancer Maternal Grandmother    Breast cancer Maternal Grandmother    Skin cancer Maternal Grandmother    Hypertension Maternal Grandfather    Hypertension Paternal Grandmother    Diabetes Paternal Grandmother    Hypertension Paternal Grandfather     Social History Social History   Tobacco Use   Smoking status: Never   Smokeless tobacco: Never  Vaping Use   Vaping Use: Never used  Substance Use Topics   Alcohol use: No    Alcohol/week: 0.0 standard drinks   Drug use: No     Allergies   Topamax [topiramate], Tramadol, Gluten meal, and Ondansetron   Review of Systems Review of Systems  Constitutional:  Positive for activity change and fatigue. Negative for appetite change and fever.  HENT:  Positive for congestion, sinus pressure and sore throat. Negative for ear pain and sneezing.   Respiratory:  Positive for cough. Negative for shortness of breath.   Cardiovascular:  Negative for chest pain.  Gastrointestinal:  Positive for nausea and vomiting. Negative for abdominal pain and diarrhea.  Musculoskeletal:  Positive for arthralgias and myalgias.  Neurological:  Positive for headaches. Negative for dizziness and light-headedness.    Physical Exam Triage Vital Signs ED Triage Vitals [05/28/21 0820]  Enc Vitals Group     BP (!) 150/91     Pulse Rate 90     Resp 16     Temp 98.2 F (36.8 C)     Temp Source Oral     SpO2 96 %     Weight      Height      Head Circumference      Peak Flow      Pain Score      Pain Loc      Pain Edu?      Excl. in [place]?    No data found.  Updated Vital Signs BP (!) 150/91 (BP Location: Left Arm)    Pulse 90    Temp 98.2 F (36.8 C) (Oral)    Resp 16    LMP  (LMP Unknown)    SpO2 96%   Visual Acuity Right Eye Distance:   Left Eye  Distance:   Bilateral Distance:    Right Eye Near:   Left Eye Near:    Bilateral Near:     Physical Exam Vitals reviewed.  Constitutional:      General: She is awake. She  is not in acute distress.    Appearance: Normal appearance. She is well-developed. She is not ill-appearing.     Comments: Very pleasant female appears stated age in no acute distress sitting comfortably on exam room table  HENT:     Head: Normocephalic and atraumatic.     Right Ear: Tympanic membrane, ear canal and external ear normal. Tympanic membrane is not erythematous or bulging.     Left Ear: Tympanic membrane, ear canal and external ear normal. Tympanic membrane is not erythematous or bulging.     Nose:     Right Sinus: Maxillary sinus tenderness present. No frontal sinus tenderness.     Left Sinus: Maxillary sinus tenderness present. No frontal sinus tenderness.     Mouth/Throat:     Dentition: Abnormal dentition.     Pharynx: Uvula midline. Posterior oropharyngeal erythema present. No oropharyngeal exudate.  Cardiovascular:     Rate and Rhythm: Normal rate and regular rhythm.     Heart sounds: Normal heart sounds, S1 normal and S2 normal. No murmur heard. Pulmonary:     Effort: Pulmonary effort is normal.     Breath sounds: Normal breath sounds. No wheezing, rhonchi or rales.     Comments: Clear to auscultation bilaterally Abdominal:     General: Bowel sounds are normal.     Palpations: Abdomen is soft.     Tenderness: There is no abdominal tenderness.  Psychiatric:        Behavior: Behavior is cooperative.     UC Treatments / Results  Labs (all labs ordered are listed, but only abnormal results are displayed) Labs Reviewed  SARS CORONAVIRUS 2 (TAT 6-24 HRS)  POC INFLUENZA A AND B ANTIGEN (URGENT CARE ONLY)    EKG   Radiology No results found.  Procedures Procedures (including critical care time)  Medications Ordered in UC Medications - No data to display  Initial Impression /  Assessment and Plan / UC Course  I have reviewed the triage vital signs and the nursing notes.  Pertinent labs & imaging results that were available during my care of the patient were reviewed by me and considered in my medical decision making (see chart for details).     Discussed likely viral etiology given short duration of symptoms.  Vital signs and physical exam are reassuring today with no indication for emergent evaluation or imaging.  No evidence of acute infection on physical exam that would warrant initiation of antibiotics.  Flu testing was negative.  COVID test is pending.  Patient was provided work excuse note with current CDC return to work guidelines based on COVID test result.  Recommended Tylenol, Mucinex, Flonase for symptom relief.  She was prescribed Tessalon for cough.  She is unable to take Zofran she was given Phenergan for nausea and vomiting symptoms.  Recommended that she rest and drink plenty of fluid.  Recommended she follow-up with either our clinic or primary care early next week if symptoms have not resolved.  Discussed that if she has any severe symptoms including chest pain, shortness of breath, nausea/vomiting interfering with oral intake, weakness she needs to be seen in the emergency room.  Strict return precautions given to which she expressed understanding.  Work excuse note provided.  Final Clinical Impressions(s) / UC Diagnoses   Final diagnoses:  Upper respiratory tract infection, unspecified type  Nausea and vomiting, unspecified vomiting type  Acute cough  Tension headache     Discharge Instructions      I believe that  you have a virus.  You were negative for flu.  We will contact you if your COVID test is positive.  Please monitor your MyChart for this result.  Use Tessalon for cough.  I also recommend over-the-counter medications including Mucinex, Tylenol, Flonase for additional symptom relief.  Use Phenergan for nausea and vomiting.  This can make  you sleepy so do not drive or drink alcohol while taking it.  If your symptoms or not improving by next week please return here or see your PCP.  If you develop any severe symptoms including chest pain, shortness of breath, fever, nausea/vomiting interfering with oral intake, weakness you need to go to the emergency room.     ED Prescriptions     Medication Sig Dispense Auth. Provider   benzonatate (TESSALON) 100 MG capsule Take 1 capsule (100 mg total) by mouth 3 (three) times daily as needed for cough. 21 capsule Lannie Yusuf K, PA-C   promethazine (PHENERGAN) 12.5 MG tablet Take 1 tablet (12.5 mg total) by mouth every 8 (eight) hours as needed for nausea or vomiting. 15 tablet Omni Dunsworth, Derry Skill, PA-C      PDMP not reviewed this encounter.   Terrilee Croak, PA-C 05/28/21 [id number]

## 2021-05-28 NOTE — Discharge Instructions (Signed)
I believe that you have a virus.  You were negative for flu.  We will contact you if your COVID test is positive.  Please monitor your MyChart for this result.  Use Tessalon for cough.  I also recommend over-the-counter medications including Mucinex, Tylenol, Flonase for additional symptom relief.  Use Phenergan for nausea and vomiting.  This can make you sleepy so do not drive or drink alcohol while taking it.  If your symptoms or not improving by next week please return here or see your PCP.  If you develop any severe symptoms including chest pain, shortness of breath, fever, nausea/vomiting interfering with oral intake, weakness you need to go to the emergency room.

## 2021-05-28 NOTE — ED Triage Notes (Signed)
Pt presents to urgent care for migraine x 3 days. Pt reports body aches and fatigue x 2-3 days.

## 2021-06-25 IMAGING — MR MR HEAD W/O CM
13 series · 46 of 48 positions shown · non-contrast
Comparison: Head CT 02/20/2019

CLINICAL DATA: Focal neuro deficit, greater than 6 hours, stroke
suspected. Additional history provided: Intermittent headache/head
heaviness. Patient presented 2 days prior for same symptoms along
with left facial numbness and slurred speech.

EXAM:
MRI HEAD WITHOUT CONTRAST
TECHNIQUE: Multiplanar, multiecho pulse sequences of the brain and surrounding
structures were obtained without intravenous contrast.

[Series 5: ax dwi_tracew · axial · 3.0mm · 0.60mm/px · z∈[-103,+51]mm · 2 of 48 slices shown]
[im 1/48]
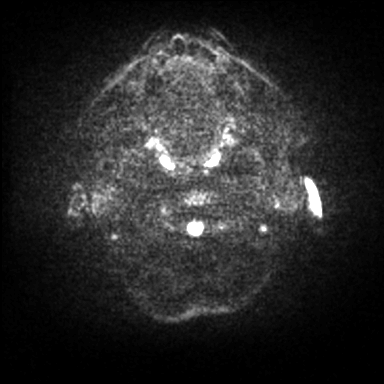
[im 48/48]
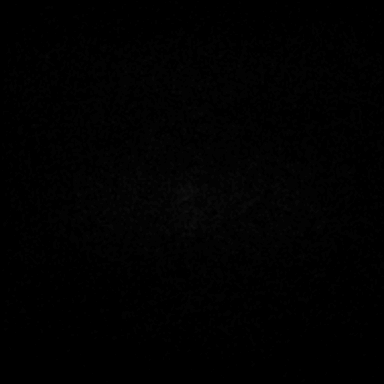

[Series 6: ax dwi_adc · axial · 3.0mm · 0.60mm/px · z∈[-103,+48]mm · 3 of 47 slices shown]
[im 1/47]
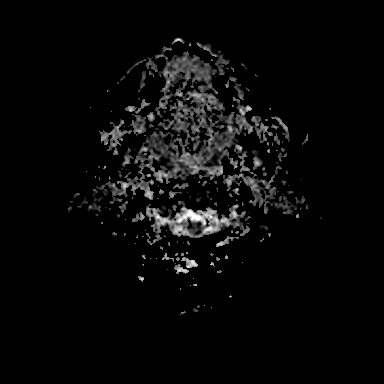
[im 24/47]
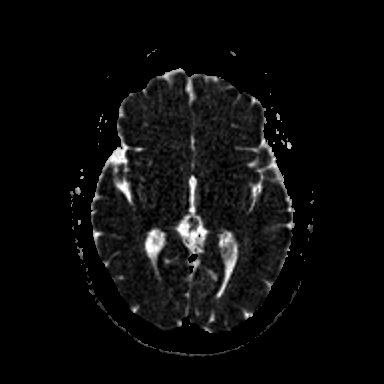
[im 47/47]
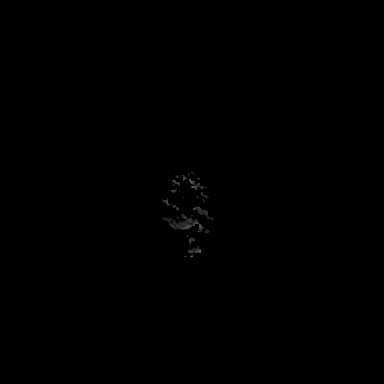

[Series 7: cor dwi_tracew · coronal · 5.0mm · 0.60mm/px · 3 of 38 slices shown]
[im 1/38]
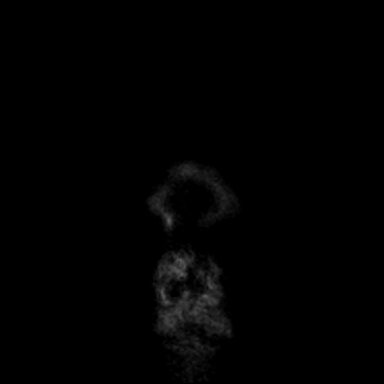
[im 19/38]
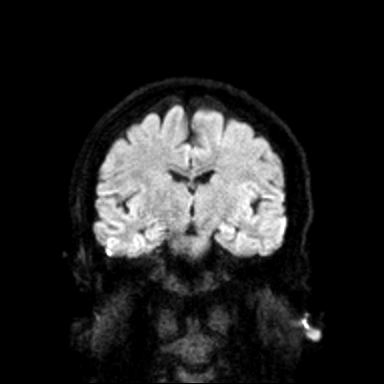
[im 38/38]
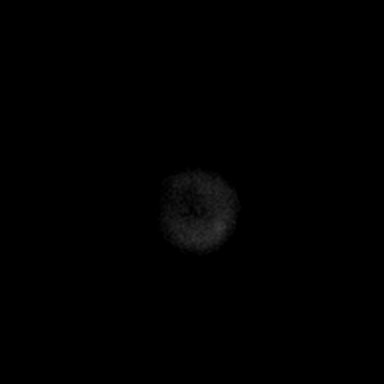

[Series 8: cor dwi_adc · coronal · 5.0mm · 0.60mm/px · 3 of 38 slices shown]
[im 1/38]
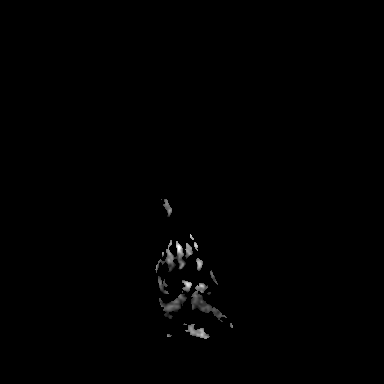
[im 19/38]
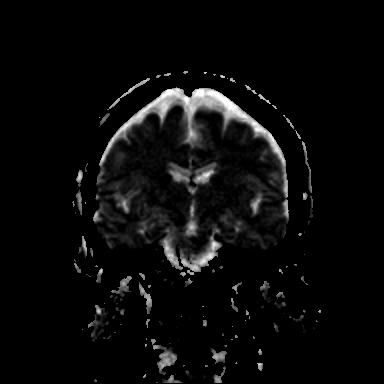
[im 38/38]
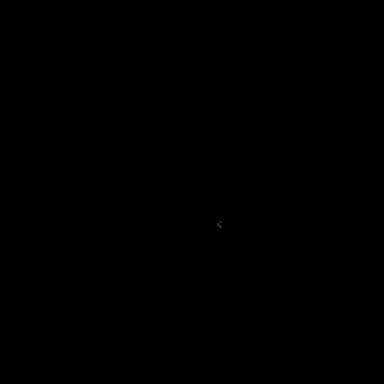

[Series 9: T1 · sagittal · 5.0mm · 0.62mm/px · 1 of 20 slices shown (1 of 2)]
[im 1/20]
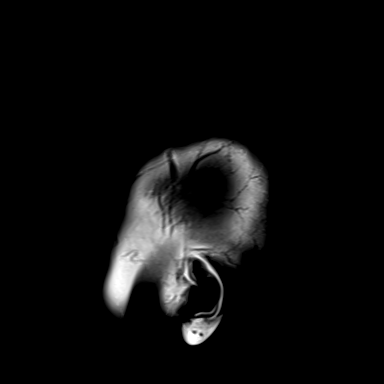

[Series 10: T2 · axial · 5.0mm · 0.53mm/px · z∈[-97,+46]mm · 2 of 25 slices shown (1 of 2)]
[im 1/25]
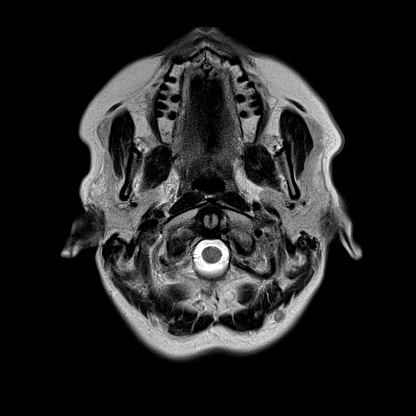
[im 25/25]
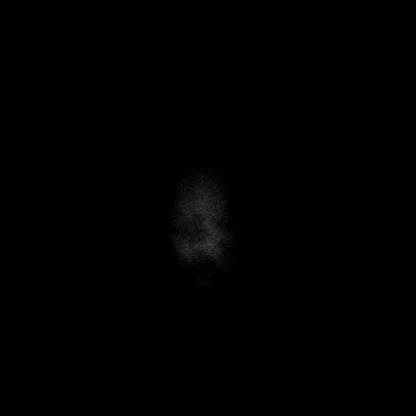

[Series 11: mag_images · axial · 3.0mm · 0.90mm/px · z∈[-114,+62]mm · 4 of 60 slices shown]
[im 1/60]
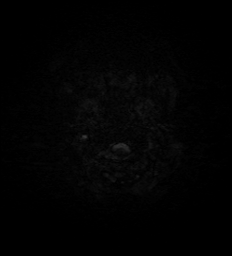
[im 20/60]
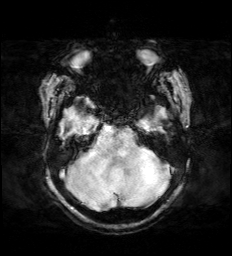
[im 40/60]
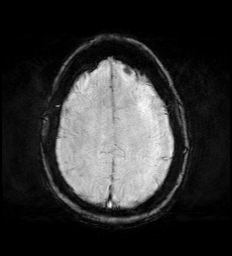
[im 60/60]
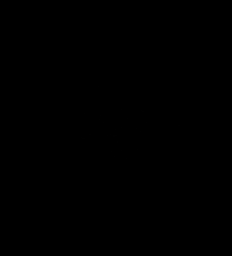

[Series 12: pha_images · axial · 3.0mm · 0.90mm/px · z∈[-114,+62]mm · 4 of 60 slices shown]
[im 1/60]
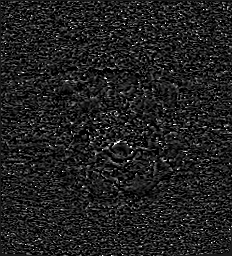
[im 20/60]
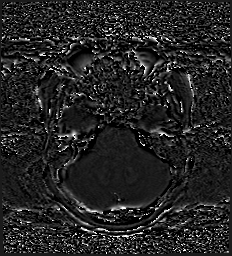
[im 40/60]
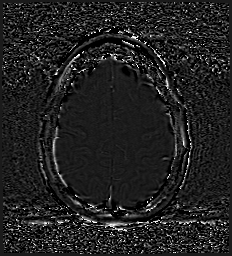
[im 60/60]
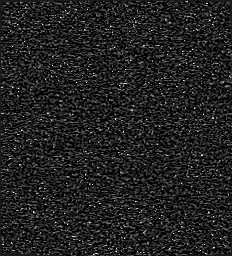

[Series 17: swi_images · axial · 3.0mm · 0.90mm/px · z∈[-114,+53]mm · 4 of 57 slices shown]
[im 1/57]
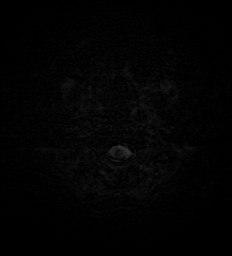
[im 19/57]
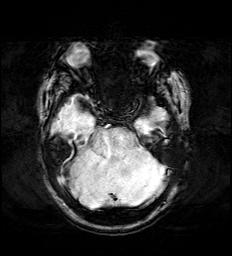
[im 38/57]
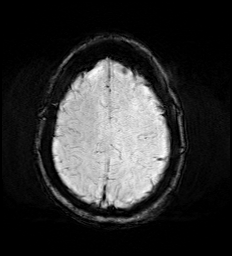
[im 57/57]
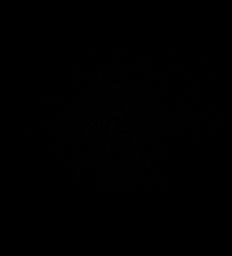

[Series 18: mip_images(sw) · axial · 24.0mm · 0.90mm/px · z∈[-104,+52]mm · 4 of 53 slices shown]
[im 1/53]
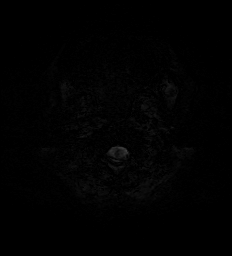
[im 18/53]
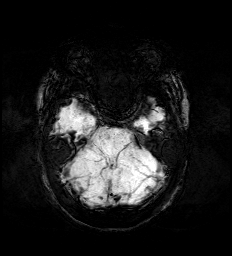
[im 35/53]
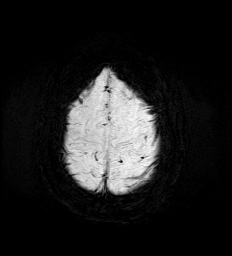
[im 53/53]
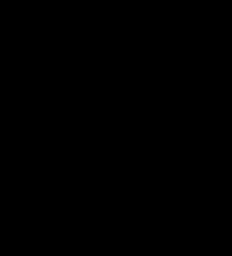

[Series 19: FLAIR · axial · 3.0mm · 0.53mm/px · z∈[-106,+55]mm · 4 of 55 slices shown]
[im 1/55]
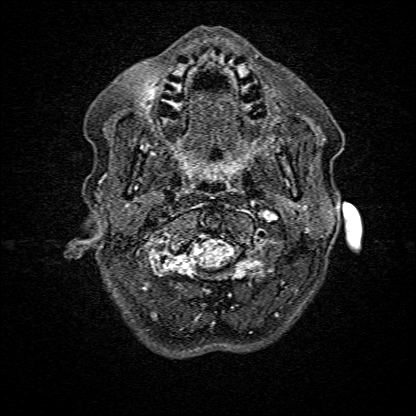
[im 19/55]
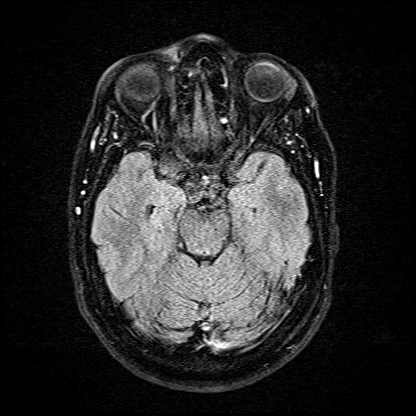
[im 37/55]
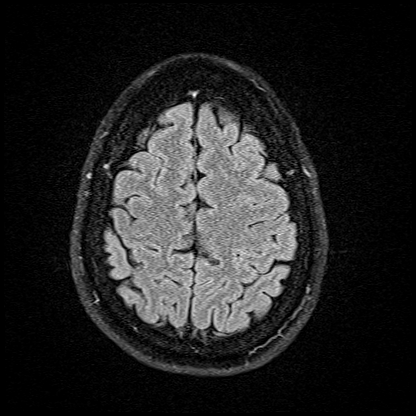
[im 55/55]
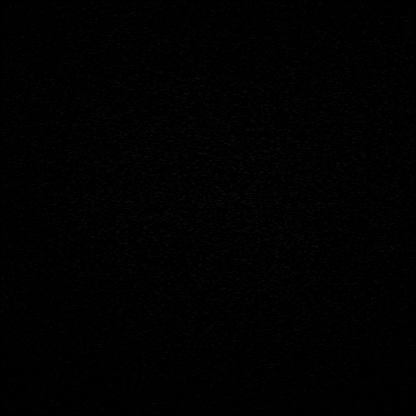

[Series 20: T1 · axial · 1.0mm · 0.98mm/px · z∈[-109,+65]mm · 10 of 173 slices shown (2 of 2)]
[im 1/173]
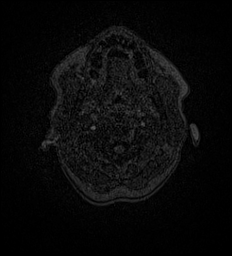
[im 16/173]
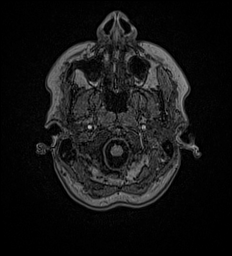
[im 32/173]
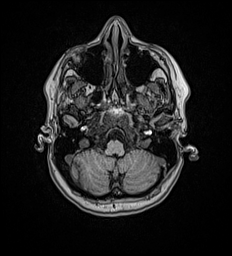
[im 47/173]
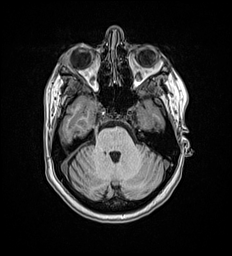
[im 63/173]
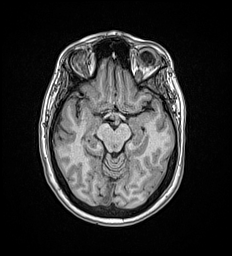
[im 79/173]
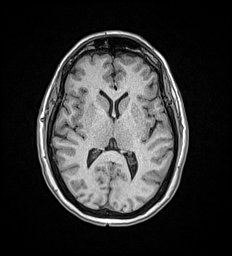
[im 94/173]
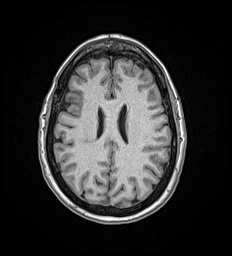
[im 126/173]
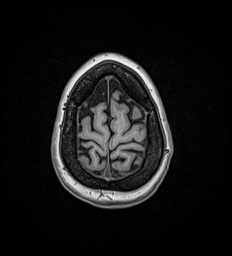
[im 141/173]
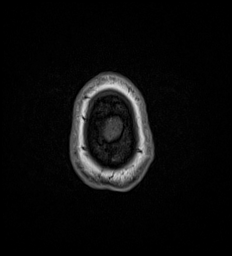
[im 173/173]
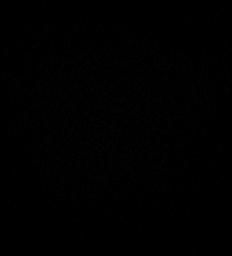

[Series 21: T2 · coronal · 5.0mm · 0.57mm/px · 2 of 29 slices shown (2 of 2)]
[im 1/29]
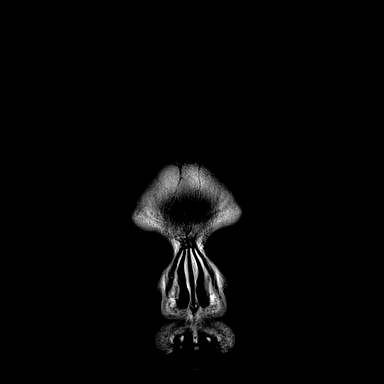
[im 29/29]
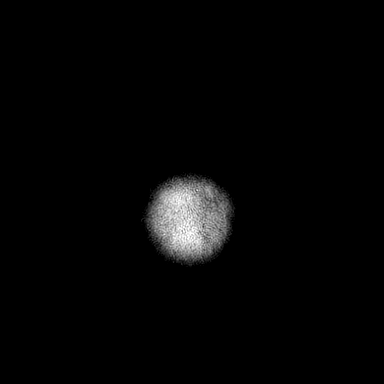

[46 of 48 positions shown; findings below may reference images not displayed]

FINDINGS: Brain:

Multiple sequences are motion degraded, most notably there is
moderate motion degradation of the coronal diffusion-weighted
imaging, mild motion degradation of the sagittal T1 weighted imaging
and moderate motion degradation of the coronal T2 weighted imaging.

There is no convincing evidence of acute infarct.

No evidence of intracranial mass.

No midline shift or extra-axial fluid collection.

No chronic intracranial blood products.

Minimal T2/FLAIR hyperintensity within the right cerebral white
matter is nonspecific, but likely reflects chronic small vessel
ischemic disease.

Cerebral volume is normal for age.

Vascular: Flow voids maintained within the proximal large arterial
vessels. Non dominant intracranial right vertebral artery.

Skull and upper cervical spine: No focal marrow lesion.

Sinuses/Orbits: Visualized orbits demonstrate no acute abnormality.
No significant paranasal sinus disease. Trace fluid within bilateral
mastoid air cells.
IMPRESSION: 1. Intermittently motion degraded examination.
2. No evidence of acute intracranial abnormality.
3. Minimal chronic small vessel ischemic disease.

## 2021-07-23 ENCOUNTER — Encounter (HOSPITAL_COMMUNITY): Payer: Self-pay

## 2021-07-23 ENCOUNTER — Ambulatory Visit (HOSPITAL_COMMUNITY)
Admission: EM | Admit: 2021-07-23 | Discharge: 2021-07-23 | Disposition: A | Discharge status: Home / Self Care | Payer: BC Managed Care – PPO | Attending: Nurse Practitioner | Admitting: Nurse Practitioner

## 2021-07-23 DIAGNOSIS — J301 Allergic rhinitis due to pollen: Secondary | ICD-10-CM | POA: Diagnosis not present

## 2021-07-23 LAB — POCT RAPID STREP A, ED / UC: Streptococcus, Group A Screen (Direct): NEGATIVE

## 2021-07-23 NOTE — ED Triage Notes (Signed)
Pt presents with c.o cough, runny  nose and sore throat x 3 days.  ? ?Pt states she thinks her throat is swollen. Pt expresses ear pain ?  ?

## 2021-07-23 NOTE — Discharge Instructions (Addendum)
-   Your symptoms are consistent with seasonal allergies.  Please continue the daily oral antihistamine.  Please also start on a intranasal corticosteroid daily. ?-The rapid strep test today is negative.  We will send for culture and let you know if it comes back positive for bacteria. ?

## 2021-07-23 NOTE — ED Provider Notes (Signed)
?[facility] ? ? ? ?CSN: [id number] ?Arrival date & time: 07/23/21  1123 ? ? ?  ? ?History   ?Chief Complaint ?Chief Complaint  ?Patient presents with  ? Cough  ? ? ?HPI ?[name] is a 56 y.o. female.  ? ?Patient presents with postnasal drainage, itchy nose, nasal stuffiness, cough that has been ongoing about a week.  She thinks it is related to her seasonal allergies.  She denies any chest pain, shortness of breath, congestion in her chest, nausea/vomiting, diarrhea, new rash on her skin.  She reports her nodes and eyes have been itchy.  She has been taking over-the-counter Claritin which seems to help most of the way.  She reports this morning, she woke up with that severe sore throat on the left side of her neck.  She reports it is painful to swallow.  She is worried she may be developing a bacterial infection. ? ? ? ? ?Past Medical History:  ?Diagnosis Date  ? Anemia   ? BV (bacterial vaginosis) 11/27/2012  ? Celiac disease   ? Cough due to ACE inhibitor 04/25/2019  ? Depression   ? Essential hypertension   ? Family history of adverse reaction to anesthesia   ? MOM-HARD TIME WAKING UP  ? GERD (gastroesophageal reflux disease)   ? Migraine with visual aura   ? MIGRAINES  ? UTI (lower urinary tract infection)   ? ? ?Patient Active Problem List  ? Diagnosis Date Noted  ? GERD (gastroesophageal reflux disease) 04/25/2019  ? Hyperlipidemia 03/02/2019  ? Left facial numbness 02/23/2019  ? Urinary incontinence 02/18/2019  ? Essential hypertension 02/18/2019  ? Recurrent cystitis 11/27/2012  ? Migraine with visual aura 09/19/2011  ? Adjustment disorder with mixed anxiety and depressed mood 09/19/2011  ? Chronic constipation 06/25/2011  ? ? ?Past Surgical History:  ?Procedure Laterality Date  ? BLADDER SUSPENSION    ? EXPLORATORY LAPAROTOMY    ? IUD REMOVAL  07/25/2017  ? Procedure: INTRAUTERINE DEVICE (IUD) REMOVAL;  Surgeon: Harlin Heys, MD;  Location: [facility] ORS;  Service: Gynecology;;  ? LAPAROSCOPIC  ASSISTED VAGINAL HYSTERECTOMY Bilateral 07/25/2017  ? Procedure: LAPAROSCOPIC ASSISTED VAGINAL HYSTERECTOMY WITH BILATERAL [place] OOPHERECTOMY;  Surgeon: Harlin Heys, MD;  Location: [facility] ORS;  Service: Gynecology;  Laterality: Bilateral;  ? TUBAL LIGATION    ? ? ?OB History   ? ? Gravida  ?3  ? Para  ?3  ? Term  ?3  ? Preterm  ?   ? AB  ?   ? Living  ?3  ?  ? ? SAB  ?   ? IAB  ?   ? Ectopic  ?   ? Multiple  ?   ? Live Births  ?3  ?   ?  ?  ? ? ? ?Home Medications   ? ?Prior to Admission medications   ?Medication Sig Start Date End Date Taking? Authorizing Provider  ?albuterol (VENTOLIN HFA) 108 (90 Base) MCG/ACT inhaler Inhale 1-2 puffs into the lungs every 6 (six) hours as needed for wheezing or shortness of breath. 01/01/21   Chase Picket, MD  ?aspirin-acetaminophen-caffeine (EXCEDRIN MIGRAINE) [phone] MG tablet Take by mouth every 6 (six) hours as needed for headache.    [provider]  ?benzonatate (TESSALON) 100 MG capsule Take 1 capsule (100 mg total) by mouth 3 (three) times daily as needed for cough. 05/28/21   Raspet, Derry Skill, PA-C  ?ibuprofen (ADVIL) 200 MG tablet Take by mouth every 6 (six)  hours as needed.    [provider]  ?losartan (COZAAR) 50 MG tablet Take 1 tablet (50 mg total) by mouth daily. For blood pressure. 09/09/20   [name], NP  ?promethazine (PHENERGAN) 12.5 MG tablet Take 1 tablet (12.5 mg total) by mouth every 8 (eight) hours as needed for nausea or vomiting. 05/28/21   Raspet, Derry Skill, PA-C  ?atorvastatin (LIPITOR) 20 MG tablet Take 1 tablet (20 mg total) by mouth every evening. For cholesterol. 03/02/19 07/02/20  [name], NP  ?traZODone (DESYREL) 50 MG tablet  08/15/19 07/02/20  [provider]  ? ? ?Family History ?Family History  ?Problem Relation Age of Onset  ? Hypertension Mother   ? Stroke Mother   ? Diabetes Son 12  ?     type 1  ? Hypertension Maternal Grandmother   ? Colon cancer Maternal Grandmother   ? Breast cancer  Maternal Grandmother   ? Skin cancer Maternal Grandmother   ? Hypertension Maternal Grandfather   ? Hypertension Paternal Grandmother   ? Diabetes Paternal Grandmother   ? Hypertension Paternal Grandfather   ? ? ?Social History ?Social History  ? ?Tobacco Use  ? Smoking status: Never  ? Smokeless tobacco: Never  ?Vaping Use  ? Vaping Use: Never used  ?Substance Use Topics  ? Alcohol use: No  ?  Alcohol/week: 0.0 standard drinks  ? Drug use: No  ? ? ? ?Allergies   ?Topamax [topiramate], Tramadol, Gluten meal, and Ondansetron ? ? ?Review of Systems ?Review of Systems ?Per HPI ? ?Physical Exam ?Triage Vital Signs ?ED Triage Vitals  ?Enc Vitals Group  ?   BP 07/23/21 1326 (!) 132/96  ?   Pulse Rate 07/23/21 1326 100  ?   Resp 07/23/21 1326 16  ?   Temp 07/23/21 1326 98.8 ?F (37.1 ?C)  ?   Temp Source 07/23/21 1326 Oral  ?   SpO2 07/23/21 1326 97 %  ?   Weight --   ?   Height --   ?   Head Circumference --   ?   Peak Flow --   ?   Pain Score 07/23/21 1324 3  ?   Pain Loc --   ?   Pain Edu? --   ?   Excl. in [place]? --   ? ?No data found. ? ?Updated Vital Signs ?BP (!) 132/96 (BP Location: Left Arm)   Pulse 100   Temp 98.8 ?F (37.1 ?C) (Oral)   Resp 16   LMP  (LMP Unknown)   SpO2 97%  ? ?Visual Acuity ?Right Eye Distance:   ?Left Eye Distance:   ?Bilateral Distance:   ? ?Right Eye Near:   ?Left Eye Near:    ?Bilateral Near:    ? ?Physical Exam ?Vitals and nursing note reviewed.  ?Constitutional:   ?   General: She is not in acute distress. ?   Appearance: She is well-developed. She is not toxic-appearing.  ?HENT:  ?   Head: Normocephalic and atraumatic.  ?   Right Ear: Tympanic membrane and ear canal normal. No drainage or swelling. No middle ear effusion. Tympanic membrane is not erythematous.  ?   Left Ear: Tympanic membrane and ear canal normal. No drainage, swelling or tenderness.  No middle ear effusion. Tympanic membrane is not erythematous.  ?   Nose: No congestion or rhinorrhea.  ?   Mouth/Throat:  ?   Mouth:  Mucous membranes are moist.  ?   Pharynx: Oropharynx  is clear. Uvula midline. Posterior oropharyngeal erythema present. No oropharyngeal exudate.  ?   Tonsils: No tonsillar exudate. 0 on the right. 0 on the left.  ?Eyes:  ?   General: No scleral icterus. ?   Extraocular Movements: Extraocular movements intact.  ?   Right eye: Normal extraocular motion.  ?   Left eye: Normal extraocular motion.  ?Cardiovascular:  ?   Rate and Rhythm: Normal rate and regular rhythm.  ?Pulmonary:  ?   Effort: Pulmonary effort is normal. No respiratory distress.  ?   Breath sounds: Normal breath sounds. No wheezing, rhonchi or rales.  ?Musculoskeletal:  ?   Cervical back: Normal range of motion and neck supple.  ?Lymphadenopathy:  ?   Cervical: No cervical adenopathy.  ?Skin: ?   General: Skin is warm and dry.  ?   Capillary Refill: Capillary refill takes less than 2 seconds.  ?   Coloration: Skin is not jaundiced or pale.  ?   Findings: No erythema or rash.  ?Neurological:  ?   Mental Status: She is alert and oriented to person, place, and time.  ?   Motor: No weakness.  ?   Gait: Gait normal.  ?Psychiatric:     ?   Behavior: Behavior is cooperative.  ? ? ? ?UC Treatments / Results  ?Labs ?(all labs ordered are listed, but only abnormal results are displayed) ?Labs Reviewed  ?CULTURE, GROUP A STREP [facility])  ?POCT RAPID STREP A, ED / UC  ? ? ?EKG ? ? ?Radiology ?No results found. ? ?Procedures ?Procedures (including critical care time) ? ?Medications Ordered in UC ?Medications - No data to display ? ?Initial Impression / Assessment and Plan / UC Course  ?I have reviewed the triage vital signs and the nursing notes. ? ?Pertinent labs & imaging results that were available during my care of the patient were reviewed by me and considered in my medical decision making (see chart for details). ? ?  ?Check rapid strep test today given abrupt onset of unilateral sore throat.  If rapid strep test is negative, send for throat culture per patient  request.  Hold off on treatment until we have results. ? ?For seasonal allergies-continue loratadine daily, also, start on intranasal corticosteroid to help with postnasal drainage.  Will give note for work. ?Roney Mans

## 2021-07-24 ENCOUNTER — Telehealth (HOSPITAL_COMMUNITY): Payer: Self-pay | Admitting: Emergency Medicine

## 2021-07-24 LAB — CULTURE, GROUP A STREP (THRC)

## 2021-07-24 MED ORDER — AMOXICILLIN 500 MG PO CAPS: 500.0000 mg | ORAL_CAPSULE | Freq: Two times a day (BID) | ORAL | 0 refills | Status: AC

## 2021-11-27 ENCOUNTER — Ambulatory Visit: Payer: BC Managed Care – PPO | Admitting: Family

## 2021-11-27 ENCOUNTER — Encounter: Payer: Self-pay | Admitting: Family

## 2021-11-27 VITALS — BP 128/70 | HR 88 | Temp 98.1°F | Resp 16 | Ht 60.0 in | Wt 157.0 lb

## 2021-11-27 DIAGNOSIS — R5383 Other fatigue: Secondary | ICD-10-CM | POA: Diagnosis not present

## 2021-11-27 DIAGNOSIS — I10 Essential (primary) hypertension: Secondary | ICD-10-CM

## 2021-11-27 DIAGNOSIS — F323 Major depressive disorder, single episode, severe with psychotic features: Secondary | ICD-10-CM

## 2021-11-27 DIAGNOSIS — F22 Delusional disorders: Secondary | ICD-10-CM

## 2021-11-27 DIAGNOSIS — R300 Dysuria: Secondary | ICD-10-CM | POA: Insufficient documentation

## 2021-11-27 DIAGNOSIS — E559 Vitamin D deficiency, unspecified: Secondary | ICD-10-CM | POA: Diagnosis not present

## 2021-11-27 DIAGNOSIS — F322 Major depressive disorder, single episode, severe without psychotic features: Secondary | ICD-10-CM | POA: Insufficient documentation

## 2021-11-27 DIAGNOSIS — R5382 Chronic fatigue, unspecified: Secondary | ICD-10-CM | POA: Insufficient documentation

## 2021-11-27 LAB — CBC WITH DIFFERENTIAL/PLATELET
Basophils Absolute: 0 10*3/uL (ref 0.0–0.1)
Basophils Relative: 0.9 % (ref 0.0–3.0)
Eosinophils Absolute: 0.2 10*3/uL (ref 0.0–0.7)
Eosinophils Relative: 5.6 % — ABNORMAL HIGH (ref 0.0–5.0)
HCT: 32.7 % — ABNORMAL LOW (ref 36.0–46.0)
Hemoglobin: 9.8 g/dL — ABNORMAL LOW (ref 12.0–15.0)
Lymphocytes Relative: 22.3 % (ref 12.0–46.0)
Lymphs Abs: 0.8 10*3/uL (ref 0.7–4.0)
MCHC: 29.9 g/dL — ABNORMAL LOW (ref 30.0–36.0)
MCV: 74 fl — ABNORMAL LOW (ref 78.0–100.0)
Monocytes Absolute: 0.3 10*3/uL (ref 0.1–1.0)
Monocytes Relative: 8.9 % (ref 3.0–12.0)
Neutro Abs: 2.3 10*3/uL (ref 1.4–7.7)
Neutrophils Relative %: 62.3 % (ref 43.0–77.0)
Platelets: 295 10*3/uL (ref 150.0–400.0)
RBC: 4.42 Mil/uL (ref 3.87–5.11)
RDW: 17.6 % — ABNORMAL HIGH (ref 11.5–15.5)
WBC: 3.6 10*3/uL — ABNORMAL LOW (ref 4.0–10.5)

## 2021-11-27 LAB — COMPREHENSIVE METABOLIC PANEL
ALT: 13 U/L (ref 0–35)
AST: 19 U/L (ref 0–37)
Albumin: 4.2 g/dL (ref 3.5–5.2)
Alkaline Phosphatase: 90 U/L (ref 39–117)
BUN: 16 mg/dL (ref 6–23)
CO2: 30 mEq/L (ref 19–32)
Calcium: 9.6 mg/dL (ref 8.4–10.5)
Chloride: 104 mEq/L (ref 96–112)
Creatinine, Ser: 0.76 mg/dL (ref 0.40–1.20)
GFR: 88.43 mL/min (ref >60.00)
Glucose, Bld: 88 mg/dL (ref 70–99)
Potassium: 4.2 mEq/L (ref 3.5–5.1)
Sodium: 142 mEq/L (ref 135–145)
Total Bilirubin: 0.4 mg/dL (ref 0.2–1.2)
Total Protein: 7 g/dL (ref 6.0–8.3)

## 2021-11-27 LAB — B12 AND FOLATE PANEL
Folate: >24.2 ng/mL (ref >5.9)
Vitamin B-12: 458 pg/mL (ref 211–911)

## 2021-11-27 LAB — TSH: TSH: 1.79 u[IU]/mL (ref 0.35–5.50)

## 2021-11-27 LAB — VITAMIN D 25 HYDROXY (VIT D DEFICIENCY, FRACTURES): VITD: 11.5 ng/mL — ABNORMAL LOW (ref 30.00–100.00)

## 2021-11-27 MED ORDER — LOSARTAN POTASSIUM 50 MG PO TABS: 50.0000 mg | ORAL_TABLET | Freq: Every day | ORAL | 0 refills | Status: DC

## 2021-11-27 MED ORDER — FLUOXETINE HCL 20 MG PO TABS: 20.0000 mg | ORAL_TABLET | Freq: Every day | ORAL | 3 refills | Status: DC

## 2021-11-27 NOTE — Patient Instructions (Addendum)
Sent in refill losartan to pharmacy.   Start 1/2 tablet Prozac for one week then increase to one tablet once daily week two.  If any worsening symptoms please immediately go to ER and or behavioral urgent care.   For walk in options for mental health:   [facility] [address] in [place], [place]. Call our 24-hour HelpLine at [phone] or [phone] for immediate assistance for mental health and substance abuse issues.  And or walk into [facility] hospital ER.   [employer] Network: 1-800-SUICIDE  The National Suicide Prevention Lifeline: 1-800-273-TALK   Due to recent changes in healthcare laws, you may see results of your imaging and/or laboratory studies on MyChart before I have had a chance to review them.  I understand that in some cases there may be results that are confusing or concerning to you. Please understand that not all results are received at the same time and often I may need to interpret multiple results in order to provide you with the best plan of care or course of treatment. Therefore, I ask that you please give me 2 business days to thoroughly review all your results before contacting my office for clarification. Should we see a critical lab result, you will be contacted sooner.   It was a pleasure seeing you today! Please do not hesitate to reach out with any questions and or concerns.  Regards,   Eugenia Pancoast FNP-C

## 2021-11-27 NOTE — Progress Notes (Signed)
Established Patient Office Visit  Subjective:  Patient ID: [name], female    DOB: [birth year]  Age: 56 y.o. MRN: [id number]  CC:  Chief Complaint  Patient presents with  . Depression  . Anxiety    Has not been to work all week. It has gotten really bad this week.     HPI [name] is here today with concerns.   For years with depression, and gets upset with herself.  Has been getting increasingly worse in the last one week.  Feels like she gets paranoid about her husband cheating on her and she believes it in her mind, even though she doesn't have any proof that this is happening and this is concerning to her as she really truly believes this.   She has acid reflux however when she gets thinking more about her anxiety especially at night she starts coughing and throwing up due to anxiety.   Monday she went home early from work because she just felt so weak and just feeling right, very fatigued, thought might be getting sick but then she felt more and more depressed and noticed the symptoms worsened over the last few days. Not able to gain much strength due to what she seems to think is her depression.   She does have suicidal ideation 'wants to crawl in a corner' and not move and or lay in the bed all day and not do anything. No homicidal ideation. States she would never actually kill herself but gets overwhelming. True believer in the lord and this is very helpful.   Doesn't have any active suicide plan. Not wanting to play with grandchildren like she used to do.   Has been on wellbutrin in the past but doesn't remember why she went off of it.   She does feel she keeps herself up at night and barely sleeps due to her anxiety as her mind is constantly racing. The main thoughts are that her husband is cheating, which she doesn't truly think is true but she does believe this. Has been going on since mid 2022. She constantly thinks about this scenario. She is concerned because  this is affecting her marriage. She has skipped a few showers, last one was maybe three days ago just doesn't feel the need to take a shower.    Past Medical History:  Diagnosis Date  . Anemia   . BV (bacterial vaginosis) 11/27/2012  . Celiac disease   . Cough due to ACE inhibitor 04/25/2019  . Depression   . Essential hypertension   . Family history of adverse reaction to anesthesia    MOM-HARD TIME WAKING UP  . GERD (gastroesophageal reflux disease)   . Migraine with visual aura    MIGRAINES  . UTI (lower urinary tract infection)     Past Surgical History:  Procedure Laterality Date  . BLADDER SUSPENSION    . EXPLORATORY LAPAROTOMY    . IUD REMOVAL  07/25/2017   Procedure: INTRAUTERINE DEVICE (IUD) REMOVAL;  Surgeon: Harlin Heys, MD;  Location: [facility] ORS;  Service: Gynecology;;  . LAPAROSCOPIC ASSISTED VAGINAL HYSTERECTOMY Bilateral 07/25/2017   Procedure: LAPAROSCOPIC ASSISTED VAGINAL HYSTERECTOMY WITH BILATERAL [place] OOPHERECTOMY;  Surgeon: Harlin Heys, MD;  Location: [facility] ORS;  Service: Gynecology;  Laterality: Bilateral;  . TUBAL LIGATION      Family History  Problem Relation Age of Onset  . Hypertension Mother   . Stroke Mother   . Diabetes Son 105  type 1  . Hypertension Maternal Grandmother   . Colon cancer Maternal Grandmother   . Breast cancer Maternal Grandmother   . Skin cancer Maternal Grandmother   . Hypertension Maternal Grandfather   . Hypertension Paternal Grandmother   . Diabetes Paternal Grandmother   . Hypertension Paternal Grandfather     Social History   Socioeconomic History  . Marital status: Married    Spouse name: Jenny Reichmann  . Number of children: 3  . Years of education: 13  . Highest education level: Not on file  Occupational History  . Occupation: Personnel officer  Tobacco Use  . Smoking status: Never  . Smokeless tobacco: Never  Vaping Use  . Vaping Use: Never used  Substance and Sexual Activity  .  Alcohol use: No    Alcohol/week: 0.0 standard drinks of alcohol  . Drug use: No  . Sexual activity: Yes    Partners: Female    Birth control/protection: Surgical    Comment: INTERCOURSE AGE 61, SEXUAL PARTNERS LEES THAN 6  Other Topics Concern  . Not on file  Social History Narrative   Lives with her husband and their three children, and her daughter's boyfriend.  Her older son's daughter lives there part-time as well.   Social Determinants of Health   Financial Resource Strain: Not on file  Food Insecurity: Not on file  Transportation Needs: Not on file  Physical Activity: Not on file  Stress: Not on file  Social Connections: Not on file  Intimate Partner Violence: Not on file    Outpatient Medications Prior to Visit  Medication Sig Dispense Refill  . losartan (COZAAR) 50 MG tablet Take 1 tablet (50 mg total) by mouth daily. For blood pressure. 90 tablet 3  . albuterol (VENTOLIN HFA) 108 (90 Base) MCG/ACT inhaler Inhale 1-2 puffs into the lungs every 6 (six) hours as needed for wheezing or shortness of breath. (Patient not taking: Reported on 11/27/2021) 18 g 0  . aspirin-acetaminophen-caffeine (EXCEDRIN MIGRAINE) 250-250-65 MG tablet Take by mouth every 6 (six) hours as needed for headache. (Patient not taking: Reported on 11/27/2021)    . benzonatate (TESSALON) 100 MG capsule Take 1 capsule (100 mg total) by mouth 3 (three) times daily as needed for cough. (Patient not taking: Reported on 11/27/2021) 21 capsule 0  . ibuprofen (ADVIL) 200 MG tablet Take by mouth every 6 (six) hours as needed. (Patient not taking: Reported on 11/27/2021)    . promethazine (PHENERGAN) 12.5 MG tablet Take 1 tablet (12.5 mg total) by mouth every 8 (eight) hours as needed for nausea or vomiting. (Patient not taking: Reported on 11/27/2021) 15 tablet 0   No facility-administered medications prior to visit.    Allergies  Allergen Reactions  . Topamax [Topiramate]     "cant function"   . Tramadol      Cant function with it   . Gluten Meal Diarrhea and Nausea Only       . Ondansetron Other (See Comments)    Helps her nausea, but makes HA worse        Objective:    Physical Exam Constitutional:      General: She is not in acute distress.    Appearance: Normal appearance. She is obese. She is not ill-appearing, toxic-appearing or diaphoretic.  Cardiovascular:     Rate and Rhythm: Normal rate and regular rhythm.  Pulmonary:     Effort: Pulmonary effort is normal.     Breath sounds: Normal breath sounds.  Neurological:  Mental Status: She is alert.    BP 128/70   Pulse 88   Temp 98.1 F (36.7 C)   Resp 16   Ht 5' (1.524 m)   Wt 157 lb (71.2 kg)   LMP  (LMP Unknown)   SpO2 98%   BMI 30.66 kg/m  Wt Readings from Last 3 Encounters:  11/27/21 157 lb (71.2 kg)  09/11/20 153 lb 6.4 oz (69.6 kg)  09/09/20 152 lb (68.9 kg)     Health Maintenance Due  Topic Date Due  . COLONOSCOPY (Pts 45-97yr Insurance coverage will need to be confirmed)  Never done  . MAMMOGRAM  Never done  . Zoster Vaccines- Shingrix (1 of 2) Never done  . PAP SMEAR-Modifier  06/12/2017  . COVID-19 Vaccine (4 - Moderna series) 09/22/2020  . INFLUENZA VACCINE  11/10/2021    There are no preventive care reminders to display for this patient.  Lab Results  Component Value Date   TSH 1.016 03/19/2019   Lab Results  Component Value Date   WBC 6.4 09/04/2020   HGB 13.0 09/04/2020   HCT 40.3 09/04/2020   MCV 84.0 09/04/2020   PLT 252 09/04/2020   Lab Results  Component Value Date   NA 140 09/04/2020   K 3.6 09/04/2020   CO2 23 09/04/2020   GLUCOSE 90 09/04/2020   BUN 15 09/04/2020   CREATININE 0.56 09/04/2020   BILITOT 0.6 09/04/2020   ALKPHOS 94 09/04/2020   AST 16 09/04/2020   ALT 11 09/04/2020   PROT 7.0 09/04/2020   ALBUMIN 3.8 09/04/2020   CALCIUM 9.1 09/04/2020   ANIONGAP 7 09/04/2020   GFR 77.31 08/29/2019   Lab Results  Component Value Date   HGBA1C 5.9 09/09/2020       Assessment & Plan:   Problem List Items Addressed This Visit   None   No orders of the defined types were placed in this encounter.   Follow-up: No follow-ups on file.    TEugenia Pancoast FNP

## 2021-11-30 ENCOUNTER — Telehealth: Payer: Self-pay | Admitting: Primary Care

## 2021-11-30 ENCOUNTER — Other Ambulatory Visit: Payer: Self-pay | Admitting: Family

## 2021-11-30 DIAGNOSIS — D509 Iron deficiency anemia, unspecified: Secondary | ICD-10-CM

## 2021-11-30 DIAGNOSIS — E559 Vitamin D deficiency, unspecified: Secondary | ICD-10-CM

## 2021-11-30 DIAGNOSIS — Z0279 Encounter for issue of other medical certificate: Secondary | ICD-10-CM

## 2021-11-30 MED ORDER — VITAMIN D (ERGOCALCIFEROL) 1.25 MG (50000 UNIT) PO CAPS: 50000.0000 [IU] | ORAL_CAPSULE | ORAL | 0 refills | Status: AC

## 2021-11-30 NOTE — Telephone Encounter (Signed)
Put in your folder for review. [name] is out of office this week. You will need to see in office to fill out right?

## 2021-11-30 NOTE — Assessment & Plan Note (Signed)
New onset. Pt aware of delusion and requesting help as far as psychiatry/psychology.  Referral placed for both. Did advise pt if any SI HI go to er and or call 911.

## 2021-11-30 NOTE — Assessment & Plan Note (Signed)
Long d/w pt in office. She reassures she has no plan for SI/HI and is a woman of faith that believes greatly and would not commit suicide. She states she is has a good support system at home and will let them know if any of this changes.   Will do a trial of prozac 20 mg, half tablet once a day for one week then increase to once daily.  Referral to psychiatry and psychology Did give information to pt in regards to where to reach out for help if needed and urgent care for behavioral health that offers walk in status. Pt to have close f/u with pcp , in two weeks.  Did also advise pt if FMLA needed I will approve this for 2-4 weeks to allow her time to get situated with medication and therapy.

## 2021-11-30 NOTE — Telephone Encounter (Signed)
Pt dropped off employee's serious health conditions forms to be filled out by pcp. Pt stated she was seen once by Dugal last Friday and pt stated Dugal said she could complete paperwork if Clark didn't, due to [facility] seeing pt for this issue. Pt requested forms to be faxed once completed, there's a card attached to forms with fax number. Forms are in pcp's folder.

## 2021-11-30 NOTE — Assessment & Plan Note (Signed)
Lab workup today to r/o other causes of fatigue, pending results

## 2021-11-30 NOTE — Progress Notes (Signed)
TERRI CAN WE ADD IBC AND FERRITIN? ALREADY ADDED IN IF ABLE  KELLY, Low iron from last time it was taken, about one year ago.  Does pt get periods anymore and or heavy periods?  Any blood in the stool? I  ordered an ifob see if pt can pick this up to get completed.   Wbc slightly low.   Have pt f/u three weeks with kate and repeat labs.  I suggest she start an otc iron supplement such as slow fe to help replace the iron loss. This is a good reason why she is tired.   Your vitamin D was a bit on the lower range. I will send an RX for vitamin D3 50,000 IU which you will take once weekly for 8 weeks. Once RX is complete, please continue over the counter Vitamin D3 1000 IU once daily. Return to the clinic for a follow up in three months to repeat your Vitamin D level.   Also ask pt how has the weekend gone? Did she start the prozac? Is she doing ok?

## 2021-12-01 ENCOUNTER — Other Ambulatory Visit: Payer: Self-pay | Admitting: Family

## 2021-12-01 DIAGNOSIS — N3090 Cystitis, unspecified without hematuria: Secondary | ICD-10-CM

## 2021-12-01 LAB — URINE CULTURE
MICRO NUMBER:: 13800087
SPECIMEN QUALITY:: ADEQUATE

## 2021-12-01 MED ORDER — SULFAMETHOXAZOLE-TRIMETHOPRIM 800-160 MG PO TABS: 1.0000 | ORAL_TABLET | Freq: Two times a day (BID) | ORAL | 0 refills | Status: AC

## 2021-12-01 NOTE — Telephone Encounter (Signed)
It looks like patient has an appointment with me for this week. Will discuss during visit.

## 2021-12-01 NOTE — Progress Notes (Signed)
[name]  Just a heads up for her f/u with you.  She has a double UTI. I sent in bactrim.  Also take a peek at her Cbc.  New anemia, but no recent blood work. She does have fatigue. So I asked her to start iron, I'm adding on iron and ferritin. Pending. Ordered FOBT.   She is also the pt I told you I started her on prozac with depression .

## 2021-12-01 NOTE — Progress Notes (Signed)
Well it makes sense why you are tired. You have Two different bacteria in your urine.  I am sending in an antibiotic that will help take care of both. Take completely.

## 2021-12-03 ENCOUNTER — Ambulatory Visit (INDEPENDENT_AMBULATORY_CARE_PROVIDER_SITE_OTHER): Payer: BC Managed Care – PPO | Admitting: Clinical

## 2021-12-03 DIAGNOSIS — F333 Major depressive disorder, recurrent, severe with psychotic symptoms: Secondary | ICD-10-CM

## 2021-12-03 NOTE — Progress Notes (Signed)
                Keelin Neville, LCSW 
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
## 2021-12-04 ENCOUNTER — Ambulatory Visit: Payer: BC Managed Care – PPO | Admitting: Primary Care

## 2021-12-04 ENCOUNTER — Encounter: Payer: Self-pay | Admitting: Primary Care

## 2021-12-04 ENCOUNTER — Telehealth: Payer: Self-pay

## 2021-12-04 VITALS — BP 124/64 | HR 89 | Temp 98.3°F | Resp 16 | Ht 60.0 in | Wt 159.0 lb

## 2021-12-04 DIAGNOSIS — F333 Major depressive disorder, recurrent, severe with psychotic symptoms: Secondary | ICD-10-CM

## 2021-12-04 DIAGNOSIS — F22 Delusional disorders: Secondary | ICD-10-CM | POA: Diagnosis not present

## 2021-12-04 DIAGNOSIS — D509 Iron deficiency anemia, unspecified: Secondary | ICD-10-CM | POA: Diagnosis not present

## 2021-12-04 DIAGNOSIS — N3090 Cystitis, unspecified without hematuria: Secondary | ICD-10-CM

## 2021-12-04 DIAGNOSIS — E559 Vitamin D deficiency, unspecified: Secondary | ICD-10-CM

## 2021-12-04 LAB — IBC + FERRITIN
Ferritin: 3.3 ng/mL — ABNORMAL LOW (ref 10.0–291.0)
Iron: 13 ug/dL — ABNORMAL LOW (ref 42–145)
Saturation Ratios: 2.5 % — ABNORMAL LOW (ref 20.0–50.0)
TIBC: 513.8 ug/dL — ABNORMAL HIGH (ref 250.0–450.0)
Transferrin: 367 mg/dL — ABNORMAL HIGH (ref 212.0–360.0)

## 2021-12-04 LAB — CBC
HCT: 30.5 % — ABNORMAL LOW (ref 36.0–46.0)
Hemoglobin: 9.3 g/dL — ABNORMAL LOW (ref 12.0–15.0)
MCHC: 30.5 g/dL (ref 30.0–36.0)
MCV: 73.7 fl — ABNORMAL LOW (ref 78.0–100.0)
Platelets: 295 10*3/uL (ref 150.0–400.0)
RBC: 4.14 Mil/uL (ref 3.87–5.11)
RDW: 17.9 % — ABNORMAL HIGH (ref 11.5–15.5)
WBC: 3.5 10*3/uL — ABNORMAL LOW (ref 4.0–10.5)

## 2021-12-04 NOTE — Progress Notes (Signed)
Subjective:    Patient ID: [name], female    DOB: [birth year], 56 y.o.   MRN: [id number]  Anxiety Symptoms include nervous/anxious behavior. Patient reports no chest pain, dizziness or shortness of breath.      [name] is a very pleasant 56 y.o. female with a history of migraines, hypertension, GERD, anxiety and depression, fatigue, paranoid delusion, iron deficiency anemia who presents today for follow-up of depression/anxiety.  She was last evaluated on 11/27/2021 by Cassandria Santee, NP for symptoms of uncontrolled anxiety, depression, paranoia that have been progressing.  Symptoms became so bad that she had to miss work all last week.  During this visit she endorsed suicidal ideation, no plan and no intention to harm herself.  She is not currently managed on treatment so she was initiated on fluoxetine 20 mg daily.  She was also referred to psychiatry and psychology, and a 2 to 4-week FMLA leave of absence from work was discussed.  During her visit last week she underwent lab work which revealed anemia with hemoglobin of 9.8, decreased from 13.0 one year prior. WBC count was noted to be 3.6, decreased from 6.4 on year prior.  Urinalysis with culture revealed a double UTI (Klebsiella pneumonia and E coli), she was treated with Bactrim DS tablets. Vitamin D was 11.   Today she endorses compliance to fluoxetine 20 mg daily, hasn't noticed much of a difference yet. Her last day of work was August 15th. Her leave has helped her mental health some, she is not laying in bed all day, is active around the house. She plan on returning to work September 20th. She saw her therapist for the first time yesterday, she is due again in 2 weeks.   She is compliant to her Bactrim DS tablets twice daily and has noticed improvement in urine odor and vaginal itching. She began vitamin D 50,000 this week.   She denies fevers, chills, abdominal pain.   BP Readings from Last 3 Encounters:  12/04/21 124/64   11/27/21 128/70  07/23/21 (!) 132/96      Review of Systems  Constitutional:  Negative for chills and fever.  Respiratory:  Negative for shortness of breath.   Cardiovascular:  Negative for chest pain.  Gastrointestinal:  Negative for abdominal pain.  Genitourinary:  Negative for dysuria, frequency and urgency.  Neurological:  Negative for dizziness.  Psychiatric/Behavioral:  The patient is nervous/anxious.          Past Medical History:  Diagnosis Date   Anemia    BV (bacterial vaginosis) 11/27/2012   Celiac disease    Cough due to ACE inhibitor 04/25/2019   Depression    Essential hypertension    Family history of adverse reaction to anesthesia    MOM-HARD TIME WAKING UP   GERD (gastroesophageal reflux disease)    Migraine with visual aura    MIGRAINES   UTI (lower urinary tract infection)     Social History   Socioeconomic History   Marital status: Married    Spouse name: [name]   Number of children: 3   Years of education: 12   Highest education level: Not on file  Occupational History   Occupation: Personnel officer  Tobacco Use   Smoking status: Never   Smokeless tobacco: Never  Vaping Use   Vaping Use: Never used  Substance and Sexual Activity   Alcohol use: No    Alcohol/week: 0.0 standard drinks of alcohol   Drug use: No   Sexual activity:  Yes    Partners: Female    Birth control/protection: Surgical    Comment: INTERCOURSE AGE 70, SEXUAL PARTNERS LEES THAN 5  Other Topics Concern   Not on file  Social History Narrative   Lives with her husband and their three children, and her daughter's boyfriend.  Her older son's daughter lives there part-time as well.   Social Determinants of Health   Financial Resource Strain: Not on file  Food Insecurity: Not on file  Transportation Needs: Not on file  Physical Activity: Not on file  Stress: Not on file  Social Connections: Not on file  Intimate Partner Violence: Not on file    Past  Surgical History:  Procedure Laterality Date   BLADDER SUSPENSION     EXPLORATORY LAPAROTOMY     IUD REMOVAL  07/25/2017   Procedure: INTRAUTERINE DEVICE (IUD) REMOVAL;  Surgeon: Harlin Heys, MD;  Location: [facility] ORS;  Service: Gynecology;;   LAPAROSCOPIC ASSISTED VAGINAL HYSTERECTOMY Bilateral 07/25/2017   Procedure: LAPAROSCOPIC ASSISTED VAGINAL HYSTERECTOMY WITH BILATERAL [place];  Surgeon: Harlin Heys, MD;  Location: [facility] OR[facility];  Service: Gynecology;  Laterality: Bilateral;   TUBAL LIGATION      Family History  Problem Relation Age of Onset   Hypertension Mother    Stroke Mother    Diabetes Son 58       type 1   Hypertension Maternal Grandmother    Colon cancer Maternal Grandmother    Breast cancer Maternal Grandmother    Skin cancer Maternal Grandmother    Hypertension Maternal Grandfather    Hypertension Paternal Grandmother    Diabetes Paternal Grandmother    Hypertension Paternal Grandfather     Allergies  Allergen Reactions   Topamax [Topiramate]     "cant function"    Tramadol     Cant function with it    Gluten Meal Diarrhea and Nausea Only        Ondansetron Other (See Comments)    Helps her nausea, but makes HA worse    Current Outpatient Medications on File Prior to Visit  Medication Sig Dispense Refill   FLUoxetine (PROZAC) 20 MG tablet Take 1 tablet (20 mg total) by mouth daily. 90 tablet 3   losartan (COZAAR) 50 MG tablet Take 1 tablet (50 mg total) by mouth daily. For blood pressure. 90 tablet 0   sulfamethoxazole-trimethoprim (BACTRIM DS) 800-160 MG tablet Take 1 tablet by mouth 2 (two) times daily for 7 days. 14 tablet 0   Vitamin D, Ergocalciferol, (DRISDOL) 1.25 MG (50000 UNIT) CAPS capsule Take 1 capsule (50,000 Units total) by mouth every 7 (seven) days for 8 doses. 8 capsule 0   [DISCONTINUED] atorvastatin (LIPITOR) 20 MG tablet Take 1 tablet (20 mg total) by mouth every evening. For cholesterol. 90 tablet 3    [DISCONTINUED] traZODone (DESYREL) 50 MG tablet      No current facility-administered medications on file prior to visit.    BP 124/64   Pulse 89   Temp 98.3 F (36.8 C)   Resp 16   Ht 5' (1.524 m)   Wt 159 lb (72.1 kg)   LMP  (LMP Unknown)   SpO2 98%   BMI 31.05 kg/m  Objective:   Physical Exam Cardiovascular:     Rate and Rhythm: Normal rate and regular rhythm.  Pulmonary:     Effort: Pulmonary effort is normal.     Breath sounds: Normal breath sounds.  Musculoskeletal:     Cervical back: Neck supple.  Skin:  General: Skin is warm and dry.  Neurological:     Mental Status: She is alert.  Psychiatric:        Mood and Affect: Mood normal.           Assessment & Plan:   Problem List Items Addressed This Visit       Genitourinary   Recurrent cystitis    Reviewed urine culture from 11/27/2021.  Continue Bactrim DS tablets twice daily x7 days.        Other   Current severe episode of major depressive disorder with psychotic features ([place]) - Primary    Appears stable, question if her double UTI was contributing to some symptoms.  Continue fluoxetine 20 mg daily.  Continue regular therapy. Discussed to call psychiatry office for an appointment as they attempted to reach her this week.        Vitamin D deficiency    Recent vitamin D level of 11.  Continue vitamin D3 50,000 IUs once weekly. Repeat vitamin D level in 3 months.      Paranoid delusion ([place])    Appears stable today.  Continue regular therapy. Discussed to contact psychiatry office who attempted to reach out to her this week. Continue fluoxetine 20 mg daily.  Agree to initiate FMLA with start date of 11/24/2021 through 12/29/2021 with tentative return to work date of 12/30/2021.      Iron deficiency anemia    Reviewed labs from 11/27/2021.  Repeat CBC pending. Add iron studies. Add occult stool testing.  She appears stable today.      Relevant Orders   IBC + Ferritin   CBC    Fecal occult blood, imunochemical       [name], NP

## 2021-12-04 NOTE — Telephone Encounter (Signed)
I left a voicemail for patient to call back.  Form is complete and ready to be picked up or mailed out.  Per form, this is not to be sent to the Dept [employer], patient must return paperwork to employer.  Copies have been made for patient, billing, scanning, and myself.  I have patient's copy at my desk until I know if she wants to pick this up or have it mailed out.

## 2021-12-04 NOTE — Assessment & Plan Note (Signed)
Appears stable, question if her double UTI was contributing to some symptoms.  Continue fluoxetine 20 mg daily.  Continue regular therapy. Discussed to call psychiatry office for an appointment as they attempted to reach her this week.

## 2021-12-04 NOTE — Assessment & Plan Note (Signed)
Appears stable today.  Continue regular therapy. Discussed to contact psychiatry office who attempted to reach out to her this week. Continue fluoxetine 20 mg daily.  Agree to initiate FMLA with start date of 11/24/2021 through 12/29/2021 with tentative return to work date of 12/30/2021.

## 2021-12-04 NOTE — Assessment & Plan Note (Signed)
Reviewed labs from 11/27/2021.  Repeat CBC pending. Add iron studies. Add occult stool testing.  She appears stable today.

## 2021-12-04 NOTE — Patient Instructions (Addendum)
Call [facility] to schedule your appointment.  Continue with regular therapy visits.  Stop by the lab prior to leaving today. I will notify you of your results once received.   Schedule follow-up visit for September 18 or 19.  It was a pleasure to see you today!

## 2021-12-04 NOTE — Assessment & Plan Note (Signed)
Reviewed urine culture from 11/27/2021.  Continue Bactrim DS tablets twice daily x7 days.

## 2021-12-04 NOTE — Assessment & Plan Note (Signed)
Recent vitamin D level of 11.  Continue vitamin D3 50,000 IUs once weekly. Repeat vitamin D level in 3 months.

## 2021-12-07 NOTE — Telephone Encounter (Signed)
Patient came by to pick up FMLA form.

## 2021-12-11 ENCOUNTER — Telehealth: Payer: Self-pay

## 2021-12-11 NOTE — Telephone Encounter (Signed)
We received a disability form from [employer].  This form is due on 12/16/21.  This form has been placed in your inbox for completion and signing.  Allie Bossier is PCP.  This disability form is for the same reason as before, more detailed.  Due on 12/16/21.  Mail out form when completed and faxed.

## 2021-12-15 NOTE — Telephone Encounter (Signed)
Called patient set up with [name] on 12/16/2021.  No further action needed at this time.

## 2021-12-15 NOTE — Telephone Encounter (Signed)
Please have patient schedule appointment with Lawerance Bach tomorrow AM.   Shari Heritage requires physical exam. Lawerance Bach will review as she saw patient on 11/27/2021 but may need updated evaluation/exam for paperwork.

## 2021-12-16 ENCOUNTER — Telehealth: Payer: Self-pay | Admitting: Primary Care

## 2021-12-16 ENCOUNTER — Ambulatory Visit: Payer: BC Managed Care – PPO | Admitting: Family

## 2021-12-16 ENCOUNTER — Ambulatory Visit: Payer: BC Managed Care – PPO | Admitting: Clinical

## 2021-12-16 ENCOUNTER — Other Ambulatory Visit (HOSPITAL_COMMUNITY)
Admission: RE | Admit: 2021-12-16 | Discharge: 2021-12-16 | Disposition: A | Discharge status: Home / Self Care | Payer: BC Managed Care – PPO | Source: Ambulatory Visit | Attending: Family | Admitting: Family

## 2021-12-16 ENCOUNTER — Encounter: Payer: Self-pay | Admitting: Family

## 2021-12-16 VITALS — BP 120/62 | HR 72 | Temp 98.3°F | Resp 16 | Ht 60.0 in | Wt 157.1 lb

## 2021-12-16 DIAGNOSIS — N898 Other specified noninflammatory disorders of vagina: Secondary | ICD-10-CM | POA: Diagnosis not present

## 2021-12-16 DIAGNOSIS — F333 Major depressive disorder, recurrent, severe with psychotic symptoms: Secondary | ICD-10-CM | POA: Diagnosis not present

## 2021-12-16 DIAGNOSIS — F22 Delusional disorders: Secondary | ICD-10-CM

## 2021-12-16 DIAGNOSIS — D509 Iron deficiency anemia, unspecified: Secondary | ICD-10-CM

## 2021-12-16 DIAGNOSIS — R350 Frequency of micturition: Secondary | ICD-10-CM | POA: Diagnosis not present

## 2021-12-16 DIAGNOSIS — E559 Vitamin D deficiency, unspecified: Secondary | ICD-10-CM

## 2021-12-16 DIAGNOSIS — N3090 Cystitis, unspecified without hematuria: Secondary | ICD-10-CM

## 2021-12-16 NOTE — Assessment & Plan Note (Signed)
Order fob  Repeat cbc in two weeks to monitor stability Cont otc ferrous sulfate daily

## 2021-12-16 NOTE — Progress Notes (Addendum)
Established Patient Office Visit  Subjective:  Patient ID: [name], female    DOB: [birth year]  Age: 56 y.o. MRN: [id number]  CC:  Chief Complaint  Patient presents with  . Annual Exam    HPI [name] is here today for follow up.   Pt is with acute concerns.  Anxiety, depression, paranoia: started on 20 mg prozac now states noticing a difference in her mood and improving day by day. Paranoid still evident but improving day by day. She did see therapist 8/24 first time, and has f/u today with her as well. She has scheduled appt next week as well. She states therapy has also been helpful for her. She made an appt with psychiatry as well, with appt pending for consult 02/04/22. She is feeling better since being out of work, and the stress of everyday life has improved slightly. She is improving day by day she states. She is having slight mor motivation on a daily basis to groom oneself, get up and going, and not sleep AS much during the day time. She still finds it hard to focus, but this is also improving with prozac as it is helping control her anxiety more.   Sleeping a bit better since starting prozac, was sleeping waking up every three to four hours, prior to this was about 10-2 hours or not sleeping at all.   Recently treated for UTI, still with vaginal odor. No dysuria.   IDA , started taking daily iron. Tolerating well. No vaginal bleeding. No blood in stool. no longer with periods.   Vitamin d def: very low, started high dose RX and then will start otc 2000 IU once daily.   Past Medical History:  Diagnosis Date  . Anemia   . BV (bacterial vaginosis) 11/27/2012  . Celiac disease   . Cough due to ACE inhibitor 04/25/2019  . Depression   . Essential hypertension   . Family history of adverse reaction to anesthesia    MOM-HARD TIME WAKING UP  . GERD (gastroesophageal reflux disease)   . Migraine with visual aura    MIGRAINES  . UTI (lower urinary tract infection)      Past Surgical History:  Procedure Laterality Date  . BLADDER SUSPENSION    . EXPLORATORY LAPAROTOMY    . IUD REMOVAL  07/25/2017   Procedure: INTRAUTERINE DEVICE (IUD) REMOVAL;  Surgeon: Harlin Heys, MD;  Location: [facility] OR[facility];  Service: Gynecology;;  . LAPAROSCOPIC ASSISTED VAGINAL HYSTERECTOMY Bilateral 07/25/2017   Procedure: LAPAROSCOPIC ASSISTED VAGINAL HYSTERECTOMY WITH BILATERAL [name];  Surgeon: Harlin Heys, MD;  Location: [facility] OR[facility];  Service: Gynecology;  Laterality: Bilateral;  . TUBAL LIGATION      Family History  Problem Relation Age of Onset  . Hypertension Mother   . Stroke Mother   . Diabetes Son 12       type 1  . Hypertension Maternal Grandmother   . Colon cancer Maternal Grandmother   . Breast cancer Maternal Grandmother   . Skin cancer Maternal Grandmother   . Hypertension Maternal Grandfather   . Hypertension Paternal Grandmother   . Diabetes Paternal Grandmother   . Hypertension Paternal Grandfather     Social History   Socioeconomic History  . Marital status: Married    Spouse name: [name]  . Number of children: 3  . Years of education: 74  . Highest education level: Not on file  Occupational History  . Occupation: Personnel officer  Tobacco Use  .  Smoking status: Never  . Smokeless tobacco: Never  Vaping Use  . Vaping Use: Never used  Substance and Sexual Activity  . Alcohol use: No    Alcohol/week: 0.0 standard drinks of alcohol  . Drug use: No  . Sexual activity: Yes    Partners: Female    Birth control/protection: Surgical    Comment: INTERCOURSE AGE 73, SEXUAL PARTNERS LEES THAN 5  Other Topics Concern  . Not on file  Social History Narrative   Lives with her husband and their three children, and her daughter's boyfriend.  Her older son's daughter lives there part-time as well.   Social Determinants of Health   Financial Resource Strain: Not on file  Food Insecurity: Not on file  Transportation  Needs: Not on file  Physical Activity: Not on file  Stress: Not on file  Social Connections: Not on file  Intimate Partner Violence: Not on file    Outpatient Medications Prior to Visit  Medication Sig Dispense Refill  . FLUoxetine (PROZAC) 20 MG tablet Take 1 tablet (20 mg total) by mouth daily. 90 tablet 3  . losartan (COZAAR) 50 MG tablet Take 1 tablet (50 mg total) by mouth daily. For blood pressure. 90 tablet 0  . Vitamin D, Ergocalciferol, (DRISDOL) 1.25 MG (50000 UNIT) CAPS capsule Take 1 capsule (50,000 Units total) by mouth every 7 (seven) days for 8 doses. 8 capsule 0   No facility-administered medications prior to visit.    Allergies  Allergen Reactions  . Topamax [Topiramate]     "cant function"   . Tramadol     Cant function with it   . Gluten Meal Diarrhea and Nausea Only       . Ondansetron Other (See Comments)    Helps her nausea, but makes HA worse        Objective:    Physical Exam Vitals reviewed.  Constitutional:      General: She is not in acute distress.    Appearance: Normal appearance. She is not ill-appearing, toxic-appearing or diaphoretic.  HENT:     Mouth/Throat:     Pharynx: No pharyngeal swelling.     Tonsils: No tonsillar exudate.  Neck:     Thyroid: No thyroid mass.  Cardiovascular:     Rate and Rhythm: Normal rate and regular rhythm.     Heart sounds: No murmur heard. Pulmonary:     Effort: Pulmonary effort is normal.  Abdominal:     General: Abdomen is flat.     Palpations: Abdomen is soft.     Tenderness: There is no abdominal tenderness.  Lymphadenopathy:     Cervical:     Right cervical: No superficial cervical adenopathy.    Left cervical: No superficial cervical adenopathy.  Skin:    Capillary Refill: Capillary refill takes less than 2 seconds.  Neurological:     General: No focal deficit present.     Mental Status: She is alert and oriented to person, place, and time. Mental status is at baseline.     Motor: No  weakness.     Gait: Gait normal.  Psychiatric:        Attention and Perception: Attention and perception normal.        Mood and Affect: Mood normal.        Speech: Speech normal.        Behavior: Behavior normal.        Thought Content: Thought content normal.        Cognition and  Memory: Cognition and memory normal.        Judgment: Judgment normal.     Comments: Grooming has improved since last visit Mood less depressed from last visit      BP 120/62   Pulse 72   Temp 98.3 F (36.8 C)   Resp 16   Ht 5' (1.524 m)   Wt 157 lb 2 oz (71.3 kg)   LMP  (LMP Unknown)   SpO2 98%   BMI 30.69 kg/m  Wt Readings from Last 3 Encounters:  12/16/21 157 lb 2 oz (71.3 kg)  12/04/21 159 lb (72.1 kg)  11/27/21 157 lb (71.2 kg)     Health Maintenance Due  Topic Date Due  . COLONOSCOPY (Pts 45-23yr Insurance coverage will need to be confirmed)  Never done  . MAMMOGRAM  Never done  . Zoster Vaccines- Shingrix (1 of 2) Never done  . PAP SMEAR-Modifier  06/12/2017  . COVID-19 Vaccine (4 - Moderna series) 09/22/2020  . INFLUENZA VACCINE  11/10/2021    There are no preventive care reminders to display for this patient.  Lab Results  Component Value Date   TSH 1.79 11/27/2021   Lab Results  Component Value Date   WBC 3.5 (L) 12/04/2021   HGB 9.3 (L) 12/04/2021   HCT 30.5 (L) 12/04/2021   MCV 73.7 (L) 12/04/2021   PLT 295.0 12/04/2021   Lab Results  Component Value Date   NA 142 11/27/2021   K 4.2 11/27/2021   CO2 30 11/27/2021   GLUCOSE 88 11/27/2021   BUN 16 11/27/2021   CREATININE 0.76 11/27/2021   BILITOT 0.4 11/27/2021   ALKPHOS 90 11/27/2021   AST 19 11/27/2021   ALT 13 11/27/2021   PROT 7.0 11/27/2021   ALBUMIN 4.2 11/27/2021   CALCIUM 9.6 11/27/2021   ANIONGAP 7 09/04/2020   GFR 88.43 11/27/2021   Lab Results  Component Value Date   CHOL 201 (H) 09/09/2020   Lab Results  Component Value Date   HDL 39.50 09/09/2020   Lab Results  Component Value Date    LDLCALC 134 (H) 09/09/2020   Lab Results  Component Value Date   TRIG 138.0 09/09/2020   Lab Results  Component Value Date   CHOLHDL 5 09/09/2020   Lab Results  Component Value Date   HGBA1C 5.9 09/09/2020      Assessment & Plan:   Problem List Items Addressed This Visit       Other   Current severe episode of major depressive disorder with psychotic features ([place]    Improving slight with prozac 20 mg once daily, will stay at current dose with potential to increase to 40 mg if necessary in future. F/u with PCP as scheduled 9/18. Keep therapy appts as scheduled, and keep psychiatrist appt as scheduled in October.   At this time, pt requires time out of work to reduce stressors and continue with projected treatment plan. Lack of focus and ability to concentrate, and decreased desire and motivation for daily tasks will inhibit her work and become a safety risk at this time.   Handout for working on anxiety reducing techniques  Completed paperwork for [employer] disability, start date 8/15 end date projected 9/20        Vitamin D deficiency    Continue RX dosing vitamin D then once completed, advised pt to take once daily vitamin D 3 2000 IU       [place]    Improving Continue f/u with  psychologist, continue prozac 20 mg once daily and keep consult appt with psychiatry as scheduled.       Iron deficiency anemia    Order fob  Repeat cbc in two weeks to monitor stability Cont otc ferrous sulfate daily         Relevant Orders   CBC with Differential   Vaginal odor    Urinary ancillary ordered to r/o bv and or candida.       Relevant Orders   Urine cytology ancillary only   Urine Culture   Urinary frequency - Primary    Ordering urine culture to see if treatment success or failure.       Relevant Orders   Urine cytology ancillary only   Urine Culture    No orders of the defined types were placed in this encounter.   Follow-up: Return for  f/u with Allie Bossier as scheduled for in office f/u on 9/18 .    Eugenia Pancoast, FNP

## 2021-12-16 NOTE — Progress Notes (Signed)
     [facility] Counselor/Therapist Progress Note  Patient ID: [name], MRN: [id number]    Date: 12/16/21  Time Spent: 8:31  am - 9:18 am : 60 Minutes  Treatment Type: Individual Therapy.  Reported Symptoms: Patient stated, "a little better" when clinician inquired about patient's mood since last session. Patient reported no motivation to complete tasks at times. Patient reported she feels happier since last session and reported she hasn't been as suspicious of husband since last session.   Mental Status Exam: Appearance:  Neat     Behavior: Appropriate  Motor: Normal  Speech/Language:  Clear and Coherent  Affect: Appropriate  Mood: normal  Thought process: normal  Thought content:   WNL  Sensory/Perceptual disturbances:   WNL  Orientation: oriented to person, place, and time/date  Attention: Good  Concentration: Good  Memory: WNL  Fund of knowledge:  Good  Insight:   Fair  Judgment:  Fair  Impulse Control: Good   Risk Assessment: Danger to Self:   Patient denied current suicidal ideation, homicidal ideation, and hallucinations.  Self-injurious Behavior:  Patient reported none Danger to Others: No Duty to Warn:no Physical Aggression / Violence:No  Access to Firearms a concern:  Patient reported firearms are in a locked safe and patient has no knowledge of the code to the safe Gang Involvement:No   Subjective:  Patient reported she started taking Prozac on August 19th. Patient reported she feels Prozac is helping with improvement in mood and suspicion. Patient reported recent lab results indicated low levels of vitamin D and iron. Patient reported her primary care physician is treating the deficiencies. Patient stated, "I've always had a jealousy issue" and reported recent decrease in suspicion towards husband. Patient stated, "I'm good with it" in response to treatment recommendations. Patient stated, "I don't feel like I look good enough" and reported a  history of weight gain has impacted how she feels about herself.   Interventions:  Clinician reviewed diagnosis and recommendations with patient. Clinician provided psycho education related to diagnosis, symptoms of psychosis, psychotropic medications, and the use of Cognitive Behavioral therapy. Clinician utilized a task centered approach in collaboration with patient to develop treatment goals. Clinician requested patient complete mood diary for homework.   Diagnosis:  Major Depressive Disorder, recurrent, severe with psychotic features  Plan: Patient is to utilize Cognitive Behavioral Therapy, coping strategies, and mindfulness to decrease symptoms associated with Major Depressive Disorder.   Long-term goal:   Patient stated, "to be happier with myself" and not experience thoughts of husband being unfaithful as a long term goal.   Reduce overall frequency and intensity of depressive symptoms as evidenced by decreased depressed mood, loss of interest, loss of motivation, anger, difficulty falling asleep and staying asleep, concentration, feeling overwhelmed, and delusions from 7 days per week to 0 to 1 days per week  Short-term goal:  Develop an understanding of the relationship between depressive symptoms and the impact on patient's thoughts and behaviors  Identify, challenge, and re-frame cognitive distortions  Identify, challenge, and replace negative core beliefs/schemas with positive and empowering beliefs     Katherina Right, LCSW

## 2021-12-16 NOTE — Telephone Encounter (Signed)
Completed form was faxed to F# [phone].  Received fax confirmation.  Copies have been made for scanning, patient, and myself.  Patient's copy has been placed in outgoing mail.

## 2021-12-16 NOTE — Assessment & Plan Note (Signed)
Improving Continue f/u with psychologist, continue prozac 20 mg once daily and keep consult appt with psychiatry as scheduled.

## 2021-12-16 NOTE — Assessment & Plan Note (Signed)
Urinary ancillary ordered to r/o bv and or candida.

## 2021-12-16 NOTE — Assessment & Plan Note (Addendum)
Improving slight with prozac 20 mg once daily, will stay at current dose with potential to increase to 40 mg if necessary in future. F/u with PCP as scheduled 9/18. Keep therapy appts as scheduled, and keep psychiatrist appt as scheduled in October.   At this time, pt requires time out of work to reduce stressors and continue with projected treatment plan. Lack of focus and ability to concentrate, and decreased desire and motivation for daily tasks will inhibit her work and become a safety risk at this time.   Handout for working on anxiety reducing techniques  Completed paperwork for [employer] disability, start date 8/15 end date projected 9/20

## 2021-12-16 NOTE — Patient Instructions (Addendum)
Finish Fecal occult blood test at home and bring back in.   Once completed with vitamin D RX dosing, start daily 2000 IU vitamin D3.  Leave urine prior to leaving.    Due to recent changes in healthcare laws, you may see results of your imaging and/or laboratory studies on MyChart before I have had a chance to review them.  I understand that in some cases there may be results that are confusing or concerning to you. Please understand that not all results are received at the same time and often I may need to interpret multiple results in order to provide you with the best plan of care or course of treatment. Therefore, I ask that you please give me 2 business days to thoroughly review all your results before contacting my office for clarification. Should we see a critical lab result, you will be contacted sooner.   It was a pleasure seeing you today! Please do not hesitate to reach out with any questions and or concerns.  Regards,   Eugenia Pancoast FNP-C

## 2021-12-16 NOTE — Assessment & Plan Note (Signed)
Continue RX dosing vitamin D then once completed, advised pt to take once daily vitamin D 3 2000 IU

## 2021-12-16 NOTE — Assessment & Plan Note (Signed)
Ordering urine culture to see if treatment success or failure.

## 2021-12-16 NOTE — Telephone Encounter (Signed)
[employer] Requesting :attending physicians statement form -Last office notes for last office visit  Fax [phone]  He stated that he sent a fax over on 8/31 for this request as well

## 2021-12-19 LAB — URINE CULTURE
MICRO NUMBER:: 13879238
SPECIMEN QUALITY:: ADEQUATE

## 2021-12-21 ENCOUNTER — Other Ambulatory Visit: Payer: Self-pay | Admitting: Family

## 2021-12-21 DIAGNOSIS — N3090 Cystitis, unspecified without hematuria: Secondary | ICD-10-CM

## 2021-12-21 LAB — URINE CYTOLOGY ANCILLARY ONLY
Bacterial Vaginitis-Urine: NEGATIVE
Candida Urine: NEGATIVE

## 2021-12-21 MED ORDER — NITROFURANTOIN MONOHYD MACRO 100 MG PO CAPS: 100.0000 mg | ORAL_CAPSULE | Freq: Two times a day (BID) | ORAL | 0 refills | Status: DC

## 2021-12-21 NOTE — Addendum Note (Signed)
Addended by: Eugenia Pancoast on: 12/21/2021 07:23 AM   Modules accepted: Orders

## 2021-12-23 ENCOUNTER — Ambulatory Visit: Payer: BC Managed Care – PPO | Admitting: Clinical

## 2021-12-28 ENCOUNTER — Other Ambulatory Visit: Payer: BC Managed Care – PPO

## 2021-12-28 ENCOUNTER — Encounter: Payer: Self-pay | Admitting: Primary Care

## 2021-12-28 ENCOUNTER — Ambulatory Visit (INDEPENDENT_AMBULATORY_CARE_PROVIDER_SITE_OTHER): Payer: BC Managed Care – PPO | Admitting: Primary Care

## 2021-12-28 ENCOUNTER — Other Ambulatory Visit: Payer: Self-pay

## 2021-12-28 VITALS — BP 130/82 | HR 68 | Temp 97.0°F | Ht 60.0 in | Wt 157.0 lb

## 2021-12-28 DIAGNOSIS — E559 Vitamin D deficiency, unspecified: Secondary | ICD-10-CM

## 2021-12-28 DIAGNOSIS — F333 Major depressive disorder, recurrent, severe with psychotic symptoms: Secondary | ICD-10-CM | POA: Diagnosis not present

## 2021-12-28 DIAGNOSIS — Z1211 Encounter for screening for malignant neoplasm of colon: Secondary | ICD-10-CM | POA: Diagnosis not present

## 2021-12-28 DIAGNOSIS — F22 Delusional disorders: Secondary | ICD-10-CM | POA: Diagnosis not present

## 2021-12-28 DIAGNOSIS — F323 Major depressive disorder, single episode, severe with psychotic features: Secondary | ICD-10-CM | POA: Diagnosis not present

## 2021-12-28 DIAGNOSIS — D509 Iron deficiency anemia, unspecified: Secondary | ICD-10-CM

## 2021-12-28 DIAGNOSIS — Z1231 Encounter for screening mammogram for malignant neoplasm of breast: Secondary | ICD-10-CM

## 2021-12-28 LAB — IBC + FERRITIN
Ferritin: 46.7 ng/mL (ref 10.0–291.0)
Iron: 317 ug/dL — ABNORMAL HIGH (ref 42–145)
Saturation Ratios: 92 % — ABNORMAL HIGH (ref 20.0–50.0)
TIBC: 344.4 ug/dL (ref 250.0–450.0)
Transferrin: 246 mg/dL (ref 212.0–360.0)

## 2021-12-28 LAB — CBC
HCT: 38 % (ref 36.0–46.0)
Hemoglobin: 12.1 g/dL (ref 12.0–15.0)
MCHC: 32 g/dL (ref 30.0–36.0)
MCV: 81.7 fl (ref 78.0–100.0)
Platelets: 240 10*3/uL (ref 150.0–400.0)
RBC: 4.65 Mil/uL (ref 3.87–5.11)
RDW: 29.1 % — ABNORMAL HIGH (ref 11.5–15.5)
WBC: 3.4 10*3/uL — ABNORMAL LOW (ref 4.0–10.5)

## 2021-12-28 MED ORDER — FLUOXETINE HCL 40 MG PO CAPS: 40.0000 mg | ORAL_CAPSULE | Freq: Every day | ORAL | 0 refills | Status: DC

## 2021-12-28 NOTE — Progress Notes (Signed)
Subjective:    Patient ID: [name], female    DOB: July 16, 1965, 56 y.o.   MRN: [id number]  Cough Pertinent negatives include no shortness of breath.    [name] is a very pleasant 56 y.o. female with a history of migraines, hypertension, hyperlipidemia, vitamin D deficiency, iron deficiency anemia, paranoid delusion, MDD who presents today for follow-up.  1) MDD/Paranoia: Currently managed on fluoxetine 20 mg daily.  She was last evaluated by Cassandria Santee, NP on 12/16/2021 for follow-up.  During this visit she endorsed improved symptoms, was following with therapy, and that she had an appointment with psychiatry in October 2023. Her fluoxetine was continued at 20 mg daily.   She is currently out on FMLA with tentative return to work date of 12/30/21.   Since her last visit she continues to feel better. She's noticed less depressed thoughts and less severe paranoia. She continues to notice decreased concentration, paranoia. She's completed 2 sessions with therapy thus far, overall feels good about these sessions.   She does not feel ready to return to work given her ongoing symptoms. She cannot drive without someone in the car with her. She continues to struggle with concentration, feels easily side tracked because of her ongoing thoughts. She would like to extend another month with a return to work date of January 27, 2022.  2) Vitamin D Deficiency: Currenlty managed on vitamin D 50,000 IU capsules once weekly.  She has three weeks remaining on her vitamin D 50,000 IU dose.   3) Iron Deficiency Anemia: Currently managed on ferrous sulfate 325 mg daily. She denies rectal and vaginal bleeding. She completed a stool card today, returned it to the lab. She has never undergone colonoscopy.    Review of Systems  Constitutional:  Positive for fatigue.  Respiratory:  Negative for shortness of breath.   Neurological:  Negative for dizziness.  Psychiatric/Behavioral:  Positive for decreased  concentration. The patient is nervous/anxious.        See HPI         Past Medical History:  Diagnosis Date   Anemia    BV (bacterial vaginosis) 11/27/2012   Celiac disease    Cough due to ACE inhibitor 04/25/2019   Depression    Essential hypertension    Family history of adverse reaction to anesthesia    MOM-HARD TIME WAKING UP   GERD (gastroesophageal reflux disease)    Migraine with visual aura    MIGRAINES   UTI (lower urinary tract infection)     Social History   Socioeconomic History   Marital status: Married    Spouse name: [name]   Number of children: 3   Years of education: 12   Highest education level: Not on file  Occupational History   Occupation: Personnel officer  Tobacco Use   Smoking status: Never   Smokeless tobacco: Never  Vaping Use   Vaping Use: Never used  Substance and Sexual Activity   Alcohol use: No    Alcohol/week: 0.0 standard drinks of alcohol   Drug use: No   Sexual activity: Yes    Partners: Female    Birth control/protection: Surgical    Comment: INTERCOURSE AGE 78, SEXUAL PARTNERS LEES THAN 6  Other Topics Concern   Not on file  Social History Narrative   Lives with her husband and their three children, and her daughter's boyfriend.  Her older son's daughter lives there part-time as well.   Social Determinants of Radio broadcast assistant  Strain: Not on file  Food Insecurity: Not on file  Transportation Needs: Not on file  Physical Activity: Not on file  Stress: Not on file  Social Connections: Not on file  Intimate Partner Violence: Not on file    Past Surgical History:  Procedure Laterality Date   BLADDER SUSPENSION     EXPLORATORY LAPAROTOMY     IUD REMOVAL  07/25/2017   Procedure: INTRAUTERINE DEVICE (IUD) REMOVAL;  Surgeon: Harlin Heys, MD;  Location: [facility] ORS;  Service: Gynecology;;   LAPAROSCOPIC ASSISTED VAGINAL HYSTERECTOMY Bilateral 07/25/2017   Procedure: LAPAROSCOPIC ASSISTED VAGINAL  HYSTERECTOMY WITH BILATERAL [place];  Surgeon: Harlin Heys, MD;  Location: [facility] ORS;  Service: Gynecology;  Laterality: Bilateral;   TUBAL LIGATION      Family History  Problem Relation Age of Onset   Hypertension Mother    Stroke Mother    Diabetes Son 6       type 1   Hypertension Maternal Grandmother    Colon cancer Maternal Grandmother    Breast cancer Maternal Grandmother    Skin cancer Maternal Grandmother    Hypertension Maternal Grandfather    Hypertension Paternal Grandmother    Diabetes Paternal Grandmother    Hypertension Paternal Grandfather     Allergies  Allergen Reactions   Topamax [Topiramate]     "cant function"    Tramadol     Cant function with it    Gluten Meal Diarrhea and Nausea Only        Ondansetron Other (See Comments)    Helps her nausea, but makes HA worse    Current Outpatient Medications on File Prior to Visit  Medication Sig Dispense Refill   losartan (COZAAR) 50 MG tablet Take 1 tablet (50 mg total) by mouth daily. For blood pressure. 90 tablet 0   omeprazole (PRILOSEC) 10 MG capsule Take 10 mg by mouth daily.     Vitamin D, Ergocalciferol, (DRISDOL) 1.25 MG (50000 UNIT) CAPS capsule Take 1 capsule (50,000 Units total) by mouth every 7 (seven) days for 8 doses. 8 capsule 0   [DISCONTINUED] atorvastatin (LIPITOR) 20 MG tablet Take 1 tablet (20 mg total) by mouth every evening. For cholesterol. 90 tablet 3   [DISCONTINUED] traZODone (DESYREL) 50 MG tablet      No current facility-administered medications on file prior to visit.    BP 130/82   Pulse 68   Temp (!) 97 F (36.1 C) (Temporal)   Ht 5' (1.524 m)   Wt 157 lb (71.2 kg)   LMP  (LMP Unknown)   SpO2 97%   BMI 30.66 kg/m  Objective:   Physical Exam Cardiovascular:     Rate and Rhythm: Normal rate and regular rhythm.  Pulmonary:     Effort: Pulmonary effort is normal.     Breath sounds: Normal breath sounds.  Musculoskeletal:     Cervical back: Neck  supple.  Skin:    General: Skin is warm and dry.  Psychiatric:        Mood and Affect: Mood normal.           Assessment & Plan:   Problem List Items Addressed This Visit       Other   Current severe episode of major depressive disorder with psychotic features ([place]) - Primary    Gradually improving, not quite at goal.   Increase fluoxetine to 40 mg daily. Follow up with therapy and psychiatry as scheduled.   Agree to extend FMLA through January 26, 2022 with return to work date of January 27, 2022.      Relevant Medications   FLUoxetine (PROZAC) 40 MG capsule   Vitamin D deficiency    Continue vitamin D 50,000 IU weekly, then transition to vitamin D 2000 IU daily. Repeat vitamin D next visit.      Paranoid delusion ([place])    Gradually improving, not quite at goal.   Increase fluoxetine to 40 mg daily. Follow up with therapy and psychiatry as scheduled.   Agree to extend FMLA through January 26, 2022 with return to work date of January 27, 2022.      Relevant Medications   FLUoxetine (PROZAC) 40 MG capsule   Iron deficiency anemia    Repeat CBC and iron studies pending.  Continue ferrous sulfate 325 mg daily. Await IFOB testing.  Referral placed to GI for colonoscopy       Relevant Orders   Ambulatory referral to Gastroenterology   CBC   IBC + Ferritin   Other Visit Diagnoses     Encounter for screening mammogram for malignant neoplasm of breast       Relevant Orders   MM 3D SCREEN BREAST BILATERAL   Screening for colon cancer       Relevant Orders   Ambulatory referral to Gastroenterology          [name], NP

## 2021-12-28 NOTE — Assessment & Plan Note (Signed)
Gradually improving, not quite at goal.   Increase fluoxetine to 40 mg daily. Follow up with therapy and psychiatry as scheduled.   Agree to extend FMLA through January 26, 2022 with return to work date of January 27, 2022.

## 2021-12-28 NOTE — Assessment & Plan Note (Signed)
Repeat CBC and iron studies pending.  Continue ferrous sulfate 325 mg daily. Await IFOB testing.  Referral placed to GI for colonoscopy

## 2021-12-28 NOTE — Assessment & Plan Note (Signed)
Continue vitamin D 50,000 IU weekly, then transition to vitamin D 2000 IU daily. Repeat vitamin D next visit.

## 2021-12-28 NOTE — Patient Instructions (Addendum)
[facility].   You will be contacted regarding your referral to GI.  Please let us know if you have not been contacted within two weeks.   We increased the dose of your fluoxetine to 40 mg daily for anxiety/depression.  Schedule a follow up visit for 1 month, before January 27, 2022.  It was a pleasure to see you today!

## 2021-12-29 LAB — FECAL OCCULT BLOOD, IMMUNOCHEMICAL: Fecal Occult Bld: NEGATIVE

## 2022-01-02 ENCOUNTER — Other Ambulatory Visit: Payer: Self-pay

## 2022-01-02 ENCOUNTER — Encounter (HOSPITAL_COMMUNITY): Payer: Self-pay | Admitting: *Deleted

## 2022-01-02 ENCOUNTER — Ambulatory Visit (HOSPITAL_COMMUNITY)
Admission: EM | Admit: 2022-01-02 | Discharge: 2022-01-02 | Disposition: A | Discharge status: Home / Self Care | Payer: BC Managed Care – PPO | Attending: Family Medicine | Admitting: Family Medicine

## 2022-01-02 DIAGNOSIS — N3000 Acute cystitis without hematuria: Secondary | ICD-10-CM | POA: Insufficient documentation

## 2022-01-02 DIAGNOSIS — M545 Low back pain, unspecified: Secondary | ICD-10-CM | POA: Insufficient documentation

## 2022-01-02 DIAGNOSIS — N390 Urinary tract infection, site not specified: Secondary | ICD-10-CM | POA: Diagnosis not present

## 2022-01-02 LAB — POCT URINALYSIS DIPSTICK, ED / UC
Bilirubin Urine: NEGATIVE
Glucose, UA: NEGATIVE mg/dL
Hgb urine dipstick: NEGATIVE
Leukocytes,Ua: NEGATIVE
Nitrite: POSITIVE — AB
Protein, ur: NEGATIVE mg/dL
Specific Gravity, Urine: 1.02 (ref 1.005–1.030)
Urobilinogen, UA: 0.2 mg/dL (ref 0.0–1.0)
pH: 5.5 (ref 5.0–8.0)

## 2022-01-02 MED ORDER — CIPROFLOXACIN HCL 500 MG PO TABS: 500.0000 mg | ORAL_TABLET | Freq: Two times a day (BID) | ORAL | 0 refills | Status: DC

## 2022-01-02 MED ORDER — LIDOCAINE HCL (PF) 1 % IJ SOLN
INTRAMUSCULAR | Status: AC
  Filled 2022-01-02: qty 2

## 2022-01-02 MED ORDER — CEFTRIAXONE SODIUM 1 G IJ SOLR
1.0000 g | Freq: Once | INTRAMUSCULAR | Status: AC
  Administered 2022-01-02: 1 g via INTRAMUSCULAR

## 2022-01-02 MED ORDER — CEFTRIAXONE SODIUM 1 G IJ SOLR
INTRAMUSCULAR | Status: AC
  Filled 2022-01-02: qty 10

## 2022-01-02 NOTE — Discharge Instructions (Addendum)
Urine is positive for nitrates which is associated with infection.  We gave you an injection of antibiotics today.  Please start ciprofloxacin twice daily for 5 days.  Make sure you rest and drink plenty of fluid.  You can use over-the-counter medications including Tylenol and ibuprofen for pain relief.  It is importantly follow-up with your urologist as scheduled.  Follow-up with your primary care as well.  If anything worsens and you have fever, nausea, vomiting, weakness, numbness or tingling sensation in your legs you should be seen immediately.

## 2022-01-02 NOTE — ED Provider Notes (Signed)
[place]    CSN: [id number] Arrival date & time: 01/02/22  1102      History   Chief Complaint Chief Complaint  Patient presents with   Back Pain    HPI [name] is a 56 y.o. female.   Patient presents today with several day history of worsening lower back pain.  She reports this is rated 10 on a 0-10 pain scale, described as an aching, no aggravating or alleviating factors identified.  She is concerned that this is a sign that her urinary tract infection has returned she has had multiple urinary tract infections recently with similar symptoms.  She reports associated urinary frequency and urgency but denies any dysuria.  She has been seen by her primary care provider several times and started on several antibiotics.  Most recently she was treated 12/16/2021 with nitrofurantoin which she reports completing as prescribed.  This did not provide any significant improvement of symptoms though culture did say that bacteria should be sensitive to this medication.  She denies any known injury or increase in activity prior to symptom onset.  Denies any bowel/bladder incontinence, lower extremity weakness, saddle anesthesia.  Denies any fever, nausea, vomiting, pelvic pain.  Denies history of diabetes or immunosuppression.  Denies SGLT2 inhibitor use.    Past Medical History:  Diagnosis Date   Anemia    BV (bacterial vaginosis) 11/27/2012   Celiac disease    Cough due to ACE inhibitor 04/25/2019   Depression    Essential hypertension    Family history of adverse reaction to anesthesia    MOM-HARD TIME WAKING UP   GERD (gastroesophageal reflux disease)    Migraine with visual aura    MIGRAINES   UTI (lower urinary tract infection)     Patient Active Problem List   Diagnosis Date Noted   Vaginal odor 12/16/2021   Urinary frequency 12/16/2021   Iron deficiency anemia 11/30/2021   Current severe episode of major depressive disorder with psychotic features ([place]) 11/27/2021    Vitamin D deficiency 11/27/2021   Paranoid delusion ([place]) 11/27/2021   GERD (gastroesophageal reflux disease) 04/25/2019   Hyperlipidemia 03/02/2019   Essential hypertension 02/18/2019   Recurrent cystitis 11/27/2012   Migraine with visual aura 09/19/2011   Chronic constipation 06/25/2011    Past Surgical History:  Procedure Laterality Date   BLADDER SUSPENSION     EXPLORATORY LAPAROTOMY     IUD REMOVAL  07/25/2017   Procedure: INTRAUTERINE DEVICE (IUD) REMOVAL;  Surgeon: Harlin Heys, MD;  Location: [facility] ORS;  Service: Gynecology;;   LAPAROSCOPIC ASSISTED VAGINAL HYSTERECTOMY Bilateral 07/25/2017   Procedure: LAPAROSCOPIC ASSISTED VAGINAL HYSTERECTOMY WITH BILATERAL [place];  Surgeon: Harlin Heys, MD;  Location: [facility] ORS;  Service: Gynecology;  Laterality: Bilateral;   TUBAL LIGATION      OB History     Gravida  3   Para  3   Term  3   Preterm      AB      Living  3      SAB      IAB      Ectopic      Multiple      Live Births  3            Home Medications    Prior to Admission medications   Medication Sig Start Date End Date Taking? Authorizing Provider  ciprofloxacin (CIPRO) 500 MG tablet Take 1 tablet (500 mg total) by mouth every 12 (twelve) hours. 01/02/22  Yes Lin Glazier K, PA-C  FLUoxetine (PROZAC) 40 MG capsule Take 1 capsule (40 mg total) by mouth daily. for anxiety and depression. 12/28/21   [name], NP  losartan (COZAAR) 50 MG tablet Take 1 tablet (50 mg total) by mouth daily. For blood pressure. 11/27/21   Eugenia Pancoast, FNP  omeprazole (PRILOSEC) 10 MG capsule Take 10 mg by mouth daily.    [provider]  Vitamin D, Ergocalciferol, (DRISDOL) 1.25 MG (50000 UNIT) CAPS capsule Take 1 capsule (50,000 Units total) by mouth every 7 (seven) days for 8 doses. 11/30/21 01/19/22  Eugenia Pancoast, FNP  atorvastatin (LIPITOR) 20 MG tablet Take 1 tablet (20 mg total) by mouth every evening. For cholesterol.  03/02/19 07/02/20  [name], NP  traZODone (DESYREL) 50 MG tablet  08/15/19 07/02/20  [provider]    Family History Family History  Problem Relation Age of Onset   Hypertension Mother    Stroke Mother    Diabetes Son 19       type 1   Hypertension Maternal Grandmother    Colon cancer Maternal Grandmother    Breast cancer Maternal Grandmother    Skin cancer Maternal Grandmother    Hypertension Maternal Grandfather    Hypertension Paternal Grandmother    Diabetes Paternal Grandmother    Hypertension Paternal Grandfather     Social History Social History   Tobacco Use   Smoking status: Never   Smokeless tobacco: Never  Vaping Use   Vaping Use: Never used  Substance Use Topics   Alcohol use: No    Alcohol/week: 0.0 standard drinks of alcohol   Drug use: No     Allergies   Topamax [topiramate], Tramadol, Gluten meal, and Ondansetron   Review of Systems Review of Systems  Constitutional:  Positive for activity change. Negative for appetite change, fatigue and fever.  Respiratory:  Negative for cough and shortness of breath.   Cardiovascular:  Negative for chest pain.  Gastrointestinal:  Negative for abdominal pain, diarrhea, nausea and vomiting.  Genitourinary:  Positive for frequency and urgency. Negative for dysuria, flank pain, vaginal bleeding, vaginal discharge and vaginal pain.  Musculoskeletal:  Positive for back pain. Negative for arthralgias and myalgias.     Physical Exam Triage Vital Signs ED Triage Vitals  Enc Vitals Group     BP 01/02/22 1229 128/89     Pulse Rate 01/02/22 1229 82     Resp 01/02/22 1229 18     Temp --      Temp src --      SpO2 01/02/22 1229 95 %     Weight --      Height --      Head Circumference --      Peak Flow --      Pain Score 01/02/22 1227 10     Pain Loc --      Pain Edu? --      Excl. in [place]? --    No data found.  Updated Vital Signs BP 128/89   Pulse 82   Resp 18   LMP  (LMP Unknown)    SpO2 95%   Visual Acuity Right Eye Distance:   Left Eye Distance:   Bilateral Distance:    Right Eye Near:   Left Eye Near:    Bilateral Near:     Physical Exam Vitals reviewed.  Constitutional:      General: She is awake. She is not in acute distress.  Appearance: Normal appearance. She is well-developed. She is not ill-appearing.     Comments: Very pleasant female appears stated age in no acute distress sitting comfortably in exam room  HENT:     Head: Normocephalic and atraumatic.  Cardiovascular:     Rate and Rhythm: Normal rate and regular rhythm.     Heart sounds: Normal heart sounds, S1 normal and S2 normal. No murmur heard. Pulmonary:     Effort: Pulmonary effort is normal.     Breath sounds: Normal breath sounds. No wheezing, rhonchi or rales.     Comments: Clear to auscultation bilaterally Abdominal:     General: Bowel sounds are normal.     Palpations: Abdomen is soft.     Tenderness: There is no abdominal tenderness. There is no right CVA tenderness, left CVA tenderness, guarding or rebound.     Comments: Benign abdominal exam  Musculoskeletal:     Cervical back: No tenderness or bony tenderness.     Thoracic back: No tenderness or bony tenderness.     Lumbar back: Tenderness present. No bony tenderness. Negative right straight leg raise test and negative left straight leg raise test.     Comments: Mild tenderness palpation over bilateral lumbar paraspinal muscles.  No pain percussion of vertebrae.  Strength 5/5 bilateral lower extremities.  No deformity or step-off noted.  Psychiatric:        Behavior: Behavior is cooperative.      UC Treatments / Results  Labs (all labs ordered are listed, but only abnormal results are displayed) Labs Reviewed  POCT URINALYSIS DIPSTICK, ED / UC - Abnormal; Notable for the following components:      Result Value   Ketones, ur TRACE (*)    Nitrite POSITIVE (*)    All other components within normal limits  URINE CULTURE     EKG   Radiology No results found.  Procedures Procedures (including critical care time)  Medications Ordered in UC Medications  cefTRIAXone (ROCEPHIN) injection 1 g (has no administration in time range)    Initial Impression / Assessment and Plan / UC Course  I have reviewed the triage vital signs and the nursing notes.  Pertinent labs & imaging results that were available during my care of the patient were reviewed by me and considered in my medical decision making (see chart for details).     Patient is well-appearing, afebrile, nontoxic, nontachycardic.  No indication for emergent evaluation or imaging based on clinical presentation today.  Patient had positive nitrate in her urine indicating ongoing infection.  Discussed that it is unclear why this continues to happen as culture shows she should have responded to antibiotics prescribed by her PCP.  We will send this off for culture again but in the meantime give her 1 g of Rocephin and start ciprofloxacin as this is not what medication she has taken recently.  No indication for dose adjustment based on CMP from 11/27/2021 with creatinine of 0.76 and calculated creatinine clearance of 94.03 mL/min.  Discussed the importance of following up with urology and she reports her primary care has already helped her establish with them with initial appointment scheduled end of October.  She is to rest and drink plenty fluid.  Can use over-the-counter analgesics for pain relief.  Discussed that if she has any worsening symptoms including fever, nausea, vomiting, worsening pain, weakness, numbness/paresthesias of the legs she needs to be seen immediately.  Strict return precautions given.  Work excuse note provided.  Final Clinical  Impressions(s) / UC Diagnoses   Final diagnoses:  Acute cystitis without hematuria  Acute bilateral low back pain without sciatica  Recurrent UTI     Discharge Instructions      Urine is positive for  nitrates which is associated with infection.  We gave you an injection of antibiotics today.  Please start ciprofloxacin twice daily for 5 days.  Make sure you rest and drink plenty of fluid.  You can use over-the-counter medications including Tylenol and ibuprofen for pain relief.  It is importantly follow-up with your urologist as scheduled.  Follow-up with your primary care as well.  If anything worsens and you have fever, nausea, vomiting, weakness, numbness or tingling sensation in your legs you should be seen immediately.     ED Prescriptions     Medication Sig Dispense Auth. Provider   ciprofloxacin (CIPRO) 500 MG tablet Take 1 tablet (500 mg total) by mouth every 12 (twelve) hours. 10 tablet Randal Goens, Derry Skill, PA-C      PDMP not reviewed this encounter.   Terrilee Croak, PA-C 01/02/22 1256

## 2022-01-02 NOTE — ED Triage Notes (Signed)
Pt reports back pain Pt has been treated x2   for UTI with Ati-Bx. Pt reports same Back pain.

## 2022-01-04 LAB — URINE CULTURE: Culture: 60000 — AB

## 2022-01-20 ENCOUNTER — Telehealth: Payer: Self-pay | Admitting: Primary Care

## 2022-01-20 ENCOUNTER — Telehealth: Payer: Self-pay

## 2022-01-20 DIAGNOSIS — Z0279 Encounter for issue of other medical certificate: Secondary | ICD-10-CM

## 2022-01-20 NOTE — Telephone Encounter (Signed)
Called and spoke with patient. She reports feeling sluggish, cant think clearly, brain fog, nauseated and vomiting. She doesn't feel well on the Prozac 40 mg. She thinks she is going back to some of the thoughts she was having prior to treatment. Please advise.

## 2022-01-20 NOTE — Telephone Encounter (Signed)
Patient missed first appointment with psychiatry due to illness. She has not called to reschedule yet, she states she intends to.  Scheduled for follow up in office 10/12.

## 2022-01-20 NOTE — Telephone Encounter (Signed)
We received a new FMLA form from the [employer].  The new form is asking if [name] would be able to go back to work with restrictions in place.  I called and left a msg for patient to call me back to verify that this is a continuation of her last FMLA leave or if this is for a new reason, because she has recently been in the hospital.

## 2022-01-20 NOTE — Telephone Encounter (Signed)
Form placed in your inbox.  Due date is 01/22/2022.

## 2022-01-20 NOTE — Telephone Encounter (Signed)
Patient called in stating that she was prescribed FLUoxetine (PROZAC) 40 MG capsule. She stated that she feel like she is going backwards instead of forward on the 40 MG capsules. Please advise. Thank you!

## 2022-01-20 NOTE — Telephone Encounter (Signed)
Please have patient come in tomorrow 01/21/22 for re-evaluation.  Also, when is her psychiatry appointment?

## 2022-01-21 ENCOUNTER — Encounter: Payer: Self-pay | Admitting: Primary Care

## 2022-01-21 ENCOUNTER — Ambulatory Visit: Payer: BC Managed Care – PPO | Admitting: Primary Care

## 2022-01-21 ENCOUNTER — Ambulatory Visit (INDEPENDENT_AMBULATORY_CARE_PROVIDER_SITE_OTHER)
Admission: RE | Admit: 2022-01-21 | Discharge: 2022-01-21 | Disposition: A | Discharge status: Home / Self Care | Payer: BC Managed Care – PPO | Source: Ambulatory Visit | Attending: Primary Care | Admitting: Primary Care

## 2022-01-21 VITALS — BP 136/82 | HR 90 | Temp 98.6°F | Ht 60.0 in | Wt 162.0 lb

## 2022-01-21 DIAGNOSIS — R053 Chronic cough: Secondary | ICD-10-CM

## 2022-01-21 DIAGNOSIS — K449 Diaphragmatic hernia without obstruction or gangrene: Secondary | ICD-10-CM | POA: Diagnosis not present

## 2022-01-21 DIAGNOSIS — R059 Cough, unspecified: Secondary | ICD-10-CM | POA: Diagnosis not present

## 2022-01-21 DIAGNOSIS — F333 Major depressive disorder, recurrent, severe with psychotic symptoms: Secondary | ICD-10-CM

## 2022-01-21 MED ORDER — BUPROPION HCL ER (SR) 100 MG PO TB12: 100.0000 mg | ORAL_TABLET | Freq: Two times a day (BID) | ORAL | 0 refills | Status: DC

## 2022-01-21 NOTE — Assessment & Plan Note (Signed)
Appears to be slightly improved but certainly still active.  Continue with therapy. She will see psychiatry as scheduled later this month.  Continue fluoxetine 40 mg daily. Add bupropion SR 100 mg daily x5 days, then increase to 100 mg twice daily thereafter.  Agree to extend FMLA with tentative return date of 02/24/2022. She will keep [name] updated regarding her symptoms and her progress with the psychiatrist and therapist.

## 2022-01-21 NOTE — Patient Instructions (Signed)
Start bupropion SR 100 mg tablets for depression. Take 1 tablet once daily for 5 days, then increase to 1 tablet twice daily thereafter.   Continue fluoxetine 40 mg daily.  Complete xray(s) prior to leaving today. I will notify you of your results once received.  Follow up with psychiatry and therapy as scheduled.  It was a pleasure to see you today!

## 2022-01-21 NOTE — Assessment & Plan Note (Signed)
Abnormal breath sounds on exam. Checking chest x-ray today.  Await results.  Consider dose adjustment of omeprazole, she will call us back with the dose she is currently taking.

## 2022-01-21 NOTE — Progress Notes (Signed)
Subjective:    Patient ID: [name], female    DOB: [birth year], 56 y.o.   MRN: [id number]  HPI  [name] is a very pleasant 56 y.o. female with a history of major depressive disorder, paranoid delusion, hyperlipidemia, hypertension, migraines who presents today for follow-up of anxiety and depression and to discuss persistent cough.  1) Anxiety/Depression: She was last evaluated by me on 12/28/2021 for follow-up of anxiety and depression.  During this visit she mentioned feeling better with less depressed thoughts and severe paranoia.  She had completed 2 therapy sessions thus far and felt good about both sessions.  She was not yet ready to return to work so her FMLA was extended with a return to work date of 01/27/2022.  Her fluoxetine was increased to 40 mg daily.  She is here for follow-up today.  Unfortunately, she missed her last therapy appointment. She is scheduled to see psychiatry on 02/04/22. Since her last visit she initially noticed an improvement with the dose increase of fluoxetine to 40 mg, but she began feeling down, little motivation to do anything, feeling restless/fidgety about 1 week later.   She is scheduled to return to work on 01/27/22 but she doesn't feel ready to return. She feels like she's "moved backwards" as her paranoia has been difficult to control. She has a hard time concentrating, difficulty processing too many questions or commands at once, and she's had little motivation to do much of anything. She would like to extend her FMLA her another month.   She denies SI/HI.   2) Cough: Acute for the last month, feels like congestion to her mid chest with a "rattling noise at times".  She denies fevers, chills, body aches.  She does experience intermittent esophageal reflux which has improved with omeprazole OTC.  Sometimes she will have to double her dose of omeprazole, but she does not recall the dose that she is currently taking at home.  She is a non-smoker  and denies a history of asthma.  She is surrounded by secondhand smoke.   Review of Systems  Constitutional:  Positive for fatigue.  Respiratory:  Negative for shortness of breath.   Cardiovascular:  Negative for chest pain.  Psychiatric/Behavioral:  Positive for sleep disturbance. The patient is nervous/anxious.        See HPI         Past Medical History:  Diagnosis Date   Anemia    BV (bacterial vaginosis) 11/27/2012   Celiac disease    Cough due to ACE inhibitor 04/25/2019   Depression    Essential hypertension    Family history of adverse reaction to anesthesia    MOM-HARD TIME WAKING UP   GERD (gastroesophageal reflux disease)    Migraine with visual aura    MIGRAINES   UTI (lower urinary tract infection)     Social History   Socioeconomic History   Marital status: Married    Spouse name: [name]   Number of children: 3   Years of education: 12   Highest education level: Not on file  Occupational History   Occupation: Personnel officer  Tobacco Use   Smoking status: Never   Smokeless tobacco: Never  Vaping Use   Vaping Use: Never used  Substance and Sexual Activity   Alcohol use: No    Alcohol/week: 0.0 standard drinks of alcohol   Drug use: No   Sexual activity: Yes    Partners: Female    Birth control/protection: Surgical  Comment: INTERCOURSE AGE 36, SEXUAL PARTNERS LEES THAN 5  Other Topics Concern   Not on file  Social History Narrative   Lives with her husband and their three children, and her daughter's boyfriend.  Her older son's daughter lives there part-time as well.   Social Determinants of Health   Financial Resource Strain: Not on file  Food Insecurity: Not on file  Transportation Needs: Not on file  Physical Activity: Not on file  Stress: Not on file  Social Connections: Not on file  Intimate Partner Violence: Not on file    Past Surgical History:  Procedure Laterality Date   BLADDER SUSPENSION     EXPLORATORY  LAPAROTOMY     IUD REMOVAL  07/25/2017   Procedure: INTRAUTERINE DEVICE (IUD) REMOVAL;  Surgeon: Harlin Heys, MD;  Location: [facility] ORS;  Service: Gynecology;;   LAPAROSCOPIC ASSISTED VAGINAL HYSTERECTOMY Bilateral 07/25/2017   Procedure: LAPAROSCOPIC ASSISTED VAGINAL HYSTERECTOMY WITH BILATERAL [place];  Surgeon: Harlin Heys, MD;  Location: [facility] OR[facility];  Service: Gynecology;  Laterality: Bilateral;   TUBAL LIGATION      Family History  Problem Relation Age of Onset   Hypertension Mother    Stroke Mother    Diabetes Son 86       type 1   Hypertension Maternal Grandmother    Colon cancer Maternal Grandmother    Breast cancer Maternal Grandmother    Skin cancer Maternal Grandmother    Hypertension Maternal Grandfather    Hypertension Paternal Grandmother    Diabetes Paternal Grandmother    Hypertension Paternal Grandfather     Allergies  Allergen Reactions   Topamax [Topiramate]     "cant function"    Tramadol     Cant function with it    Gluten Meal Diarrhea and Nausea Only        Ondansetron Other (See Comments)    Helps her nausea, but makes HA worse    Current Outpatient Medications on File Prior to Visit  Medication Sig Dispense Refill   FLUoxetine (PROZAC) 40 MG capsule Take 1 capsule (40 mg total) by mouth daily. for anxiety and depression. 90 capsule 0   losartan (COZAAR) 50 MG tablet Take 1 tablet (50 mg total) by mouth daily. For blood pressure. 90 tablet 0   omeprazole (PRILOSEC) 10 MG capsule Take 10 mg by mouth daily.     ciprofloxacin (CIPRO) 500 MG tablet Take 1 tablet (500 mg total) by mouth every 12 (twelve) hours. (Patient not taking: Reported on 01/21/2022) 10 tablet 0   [DISCONTINUED] atorvastatin (LIPITOR) 20 MG tablet Take 1 tablet (20 mg total) by mouth every evening. For cholesterol. 90 tablet 3   [DISCONTINUED] traZODone (DESYREL) 50 MG tablet      No current facility-administered medications on file prior to visit.    BP  136/82   Pulse 90   Temp 98.6 F (37 C) (Temporal)   Ht 5' (1.524 m)   Wt 162 lb (73.5 kg)   LMP  (LMP Unknown)   SpO2 99%   BMI 31.64 kg/m  Objective:   Physical Exam Cardiovascular:     Rate and Rhythm: Normal rate and regular rhythm.  Pulmonary:     Effort: Pulmonary effort is normal.     Breath sounds: Examination of the right-upper field reveals rhonchi. Examination of the right-lower field reveals rhonchi. Examination of the left-lower field reveals rhonchi. Rhonchi present.  Musculoskeletal:     Cervical back: Neck supple.  Skin:    General:  Skin is warm and dry.           Assessment & Plan:   Problem List Items Addressed This Visit       Other   Current severe episode of major depressive disorder with psychotic features ([place]) - Primary    Appears to be slightly improved but certainly still active.  Continue with therapy. She will see psychiatry as scheduled later this month.  Continue fluoxetine 40 mg daily. Add bupropion SR 100 mg daily x5 days, then increase to 100 mg twice daily thereafter.  Agree to extend FMLA with tentative return date of 02/24/2022. She will keep [name] updated regarding her symptoms and her progress with the psychiatrist and therapist.      Relevant Medications   buPROPion ER (WELLBUTRIN SR) 100 MG 12 hr tablet   Persistent cough for 3 weeks or longer    Abnormal breath sounds on exam. Checking chest x-ray today.  Await results.  Consider dose adjustment of omeprazole, she will call us back with the dose she is currently taking.      Relevant Orders   DG Chest 2 View       [name], NP

## 2022-01-21 NOTE — Telephone Encounter (Signed)
FYI, I have not heard back from [name] yet and her form states it is due 01/22/22.

## 2022-01-22 NOTE — Telephone Encounter (Signed)
Completed and placed on Kate's desk

## 2022-01-22 NOTE — Telephone Encounter (Signed)
Completed form has been faxed to [phone].  Received fax confirmation.  Copies have been made for billing, scanning, patient, and myself.  Patient's copy has been placed in outgoing mail.  Patient notified via mychart.

## 2022-01-26 ENCOUNTER — Other Ambulatory Visit: Payer: Self-pay | Admitting: Primary Care

## 2022-01-26 ENCOUNTER — Telehealth: Payer: Self-pay | Admitting: Primary Care

## 2022-01-26 DIAGNOSIS — R053 Chronic cough: Secondary | ICD-10-CM

## 2022-01-26 MED ORDER — AZITHROMYCIN 250 MG PO TABS: ORAL_TABLET | ORAL | 0 refills | Status: DC

## 2022-01-26 NOTE — Telephone Encounter (Signed)
Patient called to get a call back about the nausea she has been experiencing that she told her nurse about and wanted to know if her and Carlis Abbott has talked about it yet.

## 2022-01-26 NOTE — Telephone Encounter (Signed)
Spoke with patient. See result note for further info.

## 2022-01-27 ENCOUNTER — Encounter: Payer: Self-pay | Admitting: Primary Care

## 2022-01-27 ENCOUNTER — Ambulatory Visit: Payer: BC Managed Care – PPO | Admitting: Primary Care

## 2022-01-27 NOTE — Telephone Encounter (Signed)
We received another request for medical records for Ms Cott from the [facility].  I have faxed all office notes from 12/04/21, 12/28/21, and 01/21/22 along with the last FMLA form that was already faxed.  This was all faxed to [phone].  Received fax confirmation.
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
## 2022-02-04 ENCOUNTER — Encounter: Payer: Self-pay | Admitting: Psychiatry

## 2022-02-04 ENCOUNTER — Ambulatory Visit: Payer: BC Managed Care – PPO | Admitting: Psychiatry

## 2022-02-04 VITALS — BP 127/81 | HR 106 | Temp 99.7°F | Ht 60.0 in | Wt 162.8 lb

## 2022-02-04 DIAGNOSIS — F429 Obsessive-compulsive disorder, unspecified: Secondary | ICD-10-CM

## 2022-02-04 DIAGNOSIS — F332 Major depressive disorder, recurrent severe without psychotic features: Secondary | ICD-10-CM

## 2022-02-04 MED ORDER — FLUOXETINE HCL 20 MG PO CAPS: 20.0000 mg | ORAL_CAPSULE | Freq: Every day | ORAL | 1 refills | Status: DC

## 2022-02-04 NOTE — Patient Instructions (Signed)
Increase fluoxetine 60 mg daily  Hold bupropion  Next appointment: 12/21 at 9:30

## 2022-02-08 ENCOUNTER — Ambulatory Visit: Payer: BC Managed Care – PPO | Admitting: Urology

## 2022-02-08 VITALS — BP 121/87 | HR 93 | Ht 60.0 in | Wt 163.0 lb

## 2022-02-08 DIAGNOSIS — N3020 Other chronic cystitis without hematuria: Secondary | ICD-10-CM | POA: Diagnosis not present

## 2022-02-08 DIAGNOSIS — N3090 Cystitis, unspecified without hematuria: Secondary | ICD-10-CM | POA: Diagnosis not present

## 2022-02-08 LAB — URINALYSIS, COMPLETE
Bilirubin, UA: NEGATIVE
Glucose, UA: NEGATIVE
Ketones, UA: NEGATIVE
Leukocytes,UA: NEGATIVE
Nitrite, UA: NEGATIVE
Protein,UA: NEGATIVE
Specific Gravity, UA: 1.02 (ref 1.005–1.030)
Urobilinogen, Ur: 1 mg/dL (ref 0.2–1.0)
pH, UA: 6.5 (ref 5.0–7.5)

## 2022-02-08 LAB — MICROSCOPIC EXAMINATION

## 2022-02-08 MED ORDER — CIPROFLOXACIN HCL 250 MG PO TABS: 250.0000 mg | ORAL_TABLET | Freq: Two times a day (BID) | ORAL | 0 refills | Status: AC

## 2022-02-08 MED ORDER — NITROFURANTOIN MACROCRYSTAL 100 MG PO CAPS: 100.0000 mg | ORAL_CAPSULE | Freq: Every day | ORAL | 11 refills | Status: DC

## 2022-02-08 NOTE — Progress Notes (Signed)
02/08/2022 9:45 AM   [name] January 09, 1966 [id number]  Referring provider: [name], NP [address] [place],  [place] [address]  Chief Complaint  Patient presents with   Cystitis    HPI: I was consulted to assess the patient is recurrent bladder infections.  She thinks she gets a least 10/year.  She gets foul odor and burning but no increased frequency.  Symptoms temporarily respond to antibiotics.  She says she is infected most of the time   She normally voids every 1 or 2 hours gets up at least 2-3 times at night.  She can sometimes leak with exercise and jumping and sometimes with urgency.  She has rare bedwetting.  She does not wear a pad but she thinks she should   She had a bladder sling many years ago and has had a hysterectomy and perhaps 1 kidney stone    Pathophysiology of chronic cystitis discussed.  She has frequency and nocturia and mild incontinence that could down regulate on prophylaxis.  Call if urine culture positive.  See in about 6 weeks for pelvic examination and cystoscopy on trimethoprim.  Call if renal ultrasound is abnormal  Today I last saw the patient in January 2021.  She never returned for cystoscopy or ultrasound.  She may have become very flushed on trimethoprim and over the phone I offered daily Macrodantin  Patient is having more than 5 or 6 infections a year.  Sometimes she gets some lower abdominal and/or back pain.  Other times she has urgency and intermittent dysuria that respond favorably to antibiotics.  She has some urgency and increased frequency today and a little bit of odor and she think she still infected.  She never really took the Macrodantin or she did it was not long enough to know if it was effective  At baseline she voids every 2-3 hours and gets up 0-2 times a night  She has had a lot of positive cultures with some resistance.     PMH: Past Medical History:  Diagnosis Date   Anemia    BV (bacterial vaginosis)  11/27/2012   Celiac disease    Cough due to ACE inhibitor 04/25/2019   Depression    Essential hypertension    Family history of adverse reaction to anesthesia    MOM-HARD TIME WAKING UP   GERD (gastroesophageal reflux disease)    Migraine with visual aura    MIGRAINES   UTI (lower urinary tract infection)     Surgical History: Past Surgical History:  Procedure Laterality Date   BLADDER SUSPENSION     EXPLORATORY LAPAROTOMY     IUD REMOVAL  07/25/2017   Procedure: INTRAUTERINE DEVICE (IUD) REMOVAL;  Surgeon: Harlin Heys, MD;  Location: [facility] ORS;  Service: Gynecology;;   LAPAROSCOPIC ASSISTED VAGINAL HYSTERECTOMY Bilateral 07/25/2017   Procedure: LAPAROSCOPIC ASSISTED VAGINAL HYSTERECTOMY WITH BILATERAL [place] OOPHERECTOMY;  Surgeon: Harlin Heys, MD;  Location: [facility] ORS;  Service: Gynecology;  Laterality: Bilateral;   TUBAL LIGATION      Home Medications:  Allergies as of 02/08/2022       Reactions   Topamax [topiramate]    "cant function"    Tramadol    Cant function with it    Gluten Meal Diarrhea, Nausea Only      Ondansetron Other (See Comments)   Helps her nausea, but makes HA worse        Medication List        Accurate as  of February 08, 2022  9:45 AM. If you have any questions, ask your nurse or doctor.          azithromycin 250 MG tablet Commonly known as: ZITHROMAX Take 2 tablets by mouth today, then 1 tablet daily for 4 additional days.   buPROPion ER 100 MG 12 hr tablet Commonly known as: Wellbutrin SR Take 1 tablet (100 mg total) by mouth 2 (two) times daily. For depression.   ciprofloxacin 500 MG tablet Commonly known as: CIPRO Take 1 tablet (500 mg total) by mouth every 12 (twelve) hours.   FLUoxetine 40 MG capsule Commonly known as: PROZAC Take 1 capsule (40 mg total) by mouth daily. for anxiety and depression.   FLUoxetine 20 MG capsule Commonly known as: PROzac Take 1 capsule (20 mg total) by mouth daily.   losartan 50  MG tablet Commonly known as: COZAAR Take 1 tablet (50 mg total) by mouth daily. For blood pressure.   omeprazole 10 MG capsule Commonly known as: PRILOSEC Take 10 mg by mouth daily.        Allergies:  Allergies  Allergen Reactions   Topamax [Topiramate]     "cant function"    Tramadol     Cant function with it    Gluten Meal Diarrhea and Nausea Only        Ondansetron Other (See Comments)    Helps her nausea, but makes HA worse    Family History: Family History  Problem Relation Age of Onset   Hypertension Mother    Stroke Mother    Hypertension Maternal Grandfather    Hypertension Maternal Grandmother    Colon cancer Maternal Grandmother    Breast cancer Maternal Grandmother    Skin cancer Maternal Grandmother    Hypertension Paternal Grandfather    Hypertension Paternal Grandmother    Diabetes Paternal Grandmother    Diabetes Son 12       type 1    Social History:  reports that she has never smoked. She has never used smokeless tobacco. She reports that she does not drink alcohol and does not use drugs.  ROS:                                        Physical Exam: BP 121/87   Pulse 93   Ht 5' (1.524 m)   Wt 73.9 kg   LMP  (LMP Unknown)   BMI 31.83 kg/m   Constitutional:  Alert and oriented, No acute distress. HEENT: [place] AT, moist mucus membranes.  Trachea midline, no masses.   Laboratory Data: Lab Results  Component Value Date   WBC 3.4 (L) 12/28/2021   HGB 12.1 12/28/2021   HCT 38.0 12/28/2021   MCV 81.7 12/28/2021   PLT 240.0 12/28/2021    Lab Results  Component Value Date   CREATININE 0.76 11/27/2021    No results found for: "PSA"  No results found for: "TESTOSTERONE"  Lab Results  Component Value Date   HGBA1C 5.9 09/09/2020    Urinalysis    Component Value Date/Time   COLORURINE YELLOW (A) 09/04/2020 1453   APPEARANCEUR HAZY (A) 09/04/2020 1453   APPEARANCEUR Clear 04/30/2019 0831   LABSPEC 1.020  01/02/2022 [place] 5.5 01/02/2022 [place] 01/02/2022 [place] 01/02/2022 [place] 01/02/2022 1237   BILIRUBINUR Negative 04/30/2019 0831   KETONESUR  TRACE (A) 01/02/2022 1237   PROTEINUR NEGATIVE 01/02/2022 1237   UROBILINOGEN 0.2 01/02/2022 1237   NITRITE POSITIVE (A) 01/02/2022 1237   LEUKOCYTESUR NEGATIVE 01/02/2022 1237    Pertinent Imaging: Urine reviewed and sent for culture.  Chart reviewed  Assessment & Plan: Patient has recurrent bladder infections as she did before.  Call if renal ultrasound abnormal.  Call if culture positive.  Placed patient on Macrodantin 100 mg 3x11 and return to clinic in approximately 6 weeks for pelvic examination and cystoscopy.  I also called in ciprofloxacin 250 mg twice a day for 7 days.  Pathophysiology of UTIs discussed  1. Recurrent cystitis  - Urinalysis, Complete   No follow-ups on file.  Reece Packer, MD  [facility] [address] [place],  [address] [phone]

## 2022-02-11 LAB — CULTURE, URINE COMPREHENSIVE

## 2022-02-17 ENCOUNTER — Ambulatory Visit
Admission: RE | Admit: 2022-02-17 | Discharge: 2022-02-17 | Disposition: A | Discharge status: Home / Self Care | Payer: BC Managed Care – PPO | Source: Ambulatory Visit | Attending: Urology | Admitting: Urology

## 2022-02-17 DIAGNOSIS — N3020 Other chronic cystitis without hematuria: Secondary | ICD-10-CM | POA: Insufficient documentation

## 2022-02-17 DIAGNOSIS — N3090 Cystitis, unspecified without hematuria: Secondary | ICD-10-CM | POA: Diagnosis not present

## 2022-02-17 DIAGNOSIS — N1330 Unspecified hydronephrosis: Secondary | ICD-10-CM | POA: Diagnosis not present

## 2022-02-18 ENCOUNTER — Telehealth: Payer: Self-pay

## 2022-02-18 ENCOUNTER — Encounter: Payer: Self-pay | Admitting: Family

## 2022-02-18 ENCOUNTER — Other Ambulatory Visit (HOSPITAL_COMMUNITY): Payer: Self-pay

## 2022-02-18 ENCOUNTER — Ambulatory Visit: Payer: BC Managed Care – PPO | Admitting: Family

## 2022-02-18 VITALS — BP 128/82 | HR 69 | Temp 98.0°F | Resp 16 | Ht 60.0 in | Wt 162.5 lb

## 2022-02-18 DIAGNOSIS — R4189 Other symptoms and signs involving cognitive functions and awareness: Secondary | ICD-10-CM | POA: Diagnosis not present

## 2022-02-18 DIAGNOSIS — M62838 Other muscle spasm: Secondary | ICD-10-CM

## 2022-02-18 DIAGNOSIS — M436 Torticollis: Secondary | ICD-10-CM

## 2022-02-18 DIAGNOSIS — K5909 Other constipation: Secondary | ICD-10-CM

## 2022-02-18 DIAGNOSIS — K219 Gastro-esophageal reflux disease without esophagitis: Secondary | ICD-10-CM | POA: Diagnosis not present

## 2022-02-18 DIAGNOSIS — E559 Vitamin D deficiency, unspecified: Secondary | ICD-10-CM | POA: Diagnosis not present

## 2022-02-18 DIAGNOSIS — R053 Chronic cough: Secondary | ICD-10-CM

## 2022-02-18 DIAGNOSIS — F333 Major depressive disorder, recurrent, severe with psychotic symptoms: Secondary | ICD-10-CM

## 2022-02-18 DIAGNOSIS — R5383 Other fatigue: Secondary | ICD-10-CM | POA: Diagnosis not present

## 2022-02-18 LAB — CBC
HCT: 38.2 % (ref 36.0–46.0)
Hemoglobin: 12.5 g/dL (ref 12.0–15.0)
MCHC: 32.8 g/dL (ref 30.0–36.0)
MCV: 87 fl (ref 78.0–100.0)
Platelets: 278 10*3/uL (ref 150.0–400.0)
RBC: 4.39 Mil/uL (ref 3.87–5.11)
RDW: 21.2 % — ABNORMAL HIGH (ref 11.5–15.5)
WBC: 4.6 10*3/uL (ref 4.0–10.5)

## 2022-02-18 LAB — COMPREHENSIVE METABOLIC PANEL
ALT: 12 U/L (ref 0–35)
AST: 14 U/L (ref 0–37)
Albumin: 4 g/dL (ref 3.5–5.2)
Alkaline Phosphatase: 77 U/L (ref 39–117)
BUN: 19 mg/dL (ref 6–23)
CO2: 28 mEq/L (ref 19–32)
Calcium: 9 mg/dL (ref 8.4–10.5)
Chloride: 104 mEq/L (ref 96–112)
Creatinine, Ser: 0.75 mg/dL (ref 0.40–1.20)
GFR: 89.7 mL/min (ref >60.00)
Glucose, Bld: 95 mg/dL (ref 70–99)
Potassium: 4.2 mEq/L (ref 3.5–5.1)
Sodium: 138 mEq/L (ref 135–145)
Total Bilirubin: 0.2 mg/dL (ref 0.2–1.2)
Total Protein: 6.6 g/dL (ref 6.0–8.3)

## 2022-02-18 LAB — TSH: TSH: 3.01 u[IU]/mL (ref 0.35–5.50)

## 2022-02-18 LAB — VITAMIN B12: Vitamin B-12: 324 pg/mL (ref 211–911)

## 2022-02-18 LAB — VITAMIN D 25 HYDROXY (VIT D DEFICIENCY, FRACTURES): VITD: 21.56 ng/mL — ABNORMAL LOW (ref 30.00–100.00)

## 2022-02-18 MED ORDER — CYCLOBENZAPRINE HCL 5 MG PO TABS: ORAL_TABLET | ORAL | 0 refills | Status: DC

## 2022-02-18 MED ORDER — PANTOPRAZOLE SODIUM 40 MG PO TBEC: 40.0000 mg | DELAYED_RELEASE_TABLET | Freq: Every day | ORAL | 0 refills | Status: DC

## 2022-02-18 NOTE — Telephone Encounter (Signed)
Patient Advocate Encounter  Received notification from [place] that prior authorization is required for Pantoprazole Sodium 40MG dr tablets. PA submitted and APPROVED on 02/18/2022.  Key [id number] Effective: 02/18/2022 - 02/17/2023

## 2022-02-18 NOTE — Progress Notes (Signed)
Established Patient Office Visit  Subjective:  Patient ID: [name], female    DOB: September 22, 1965  Age: 56 y.o. MRN: [id number]  CC:  Chief Complaint  Patient presents with   Tinnitus    When she moves her head it feels like spring. Lightheaded and throwing up almost everyday, and been getting headaches.    HPI [name] is here today with concerns.   About three weeks ago when she moves her head side to side she feels like she is hearing 'springs' and she is often getting lightheaded when moving head normal. Her head feels pretty heavy, with neck stiffness and tension.  She states she has been throwing up for the last five months, and over the last one week she states she is is throwing up almost nightly. She does take prilosec 20 mg twice daily. She has been on this for awhile.   She has had h/o recurrent UTI did end up seeing urology and was placed on antbx once daily which has been helpful.   Depression: harder to think and often with brain fog since increasing prozac to 60 mg. She often finds that she is 'zoning out'   Pt states she has chronic constipation, since childhood she states she doesn't have a bowel movement but once a month? She states this has been like this all her life.  Past Medical History:  Diagnosis Date   Anemia    BV (bacterial vaginosis) 11/27/2012   Celiac disease    Cough due to ACE inhibitor 04/25/2019   Depression    Essential hypertension    Family history of adverse reaction to anesthesia    MOM-HARD TIME WAKING UP   GERD (gastroesophageal reflux disease)    Migraine with visual aura    MIGRAINES   UTI (lower urinary tract infection)     Past Surgical History:  Procedure Laterality Date   BLADDER SUSPENSION     EXPLORATORY LAPAROTOMY     IUD REMOVAL  07/25/2017   Procedure: INTRAUTERINE DEVICE (IUD) REMOVAL;  Surgeon: Harlin Heys, MD;  Location: [facility] ORS;  Service: Gynecology;;   LAPAROSCOPIC ASSISTED VAGINAL HYSTERECTOMY  Bilateral 07/25/2017   Procedure: LAPAROSCOPIC ASSISTED VAGINAL HYSTERECTOMY WITH BILATERAL [place] OOPHERECTOMY;  Surgeon: Harlin Heys, MD;  Location: [facility] ORS;  Service: Gynecology;  Laterality: Bilateral;   TUBAL LIGATION      Family History  Problem Relation Age of Onset   Hypertension Mother    Stroke Mother    Hypertension Maternal Grandfather    Hypertension Maternal Grandmother    Colon cancer Maternal Grandmother    Breast cancer Maternal Grandmother    Skin cancer Maternal Grandmother    Hypertension Paternal Grandfather    Hypertension Paternal Grandmother    Diabetes Paternal Grandmother    Diabetes Son 34       type 1    Social History   Socioeconomic History   Marital status: Married    Spouse name: [name]   Number of children: 3   Years of education: 12   Highest education level: High school graduate  Occupational History   Occupation: Personnel officer  Tobacco Use   Smoking status: Never   Smokeless tobacco: Never  Scientific laboratory technician Use: Never used  Substance and Sexual Activity   Alcohol use: No    Alcohol/week: 0.0 standard drinks of alcohol   Drug use: No   Sexual activity: Yes    Partners: Female    Birth control/protection:  Surgical    Comment: INTERCOURSE AGE 33, SEXUAL PARTNERS LEES THAN 5  Other Topics Concern   Not on file  Social History Narrative   Lives with her husband and their three children, and her daughter's boyfriend.  Her older son's daughter lives there part-time as well.   Social Determinants of Health   Financial Resource Strain: Not on file  Food Insecurity: Not on file  Transportation Needs: Not on file  Physical Activity: Not on file  Stress: Not on file  Social Connections: Not on file  Intimate Partner Violence: Not on file    Outpatient Medications Prior to Visit  Medication Sig Dispense Refill   FLUoxetine (PROZAC) 20 MG capsule Take 1 capsule (20 mg total) by mouth daily. 30 capsule 1    FLUoxetine (PROZAC) 40 MG capsule Take 1 capsule (40 mg total) by mouth daily. for anxiety and depression. 90 capsule 0   nitrofurantoin (MACRODANTIN) 100 MG capsule Take 1 capsule (100 mg total) by mouth daily. 30 capsule 11   losartan (COZAAR) 50 MG tablet Take 1 tablet (50 mg total) by mouth daily. For blood pressure. 90 tablet 0   omeprazole (PRILOSEC) 10 MG capsule Take 10 mg by mouth daily.     No facility-administered medications prior to visit.    Allergies  Allergen Reactions   Topamax [Topiramate]     "cant function"    Tramadol     Cant function with it    Gluten Meal Diarrhea and Nausea Only        Ondansetron Other (See Comments)    Helps her nausea, but makes HA worse        Objective:    Physical Exam Vitals reviewed.  Constitutional:      General: She is not in acute distress.    Appearance: Normal appearance. She is not ill-appearing or toxic-appearing.  HENT:     Right Ear: Tympanic membrane normal.     Left Ear: Tympanic membrane normal.     Mouth/Throat:     Mouth: Mucous membranes are moist.     Pharynx: No pharyngeal swelling.     Tonsils: No tonsillar exudate.  Eyes:     Extraocular Movements: Extraocular movements intact.     Conjunctiva/sclera: Conjunctivae normal.     Pupils: Pupils are equal, round, and reactive to light.  Neck:     Thyroid: No thyroid mass.  Cardiovascular:     Rate and Rhythm: Normal rate and regular rhythm.  Pulmonary:     Effort: Pulmonary effort is normal.     Breath sounds: Normal breath sounds.  Abdominal:     General: Abdomen is flat. Bowel sounds are normal.     Palpations: Abdomen is soft.  Musculoskeletal:        General: Normal range of motion.  Lymphadenopathy:     Cervical:     Right cervical: No superficial cervical adenopathy.    Left cervical: No superficial cervical adenopathy.  Skin:    General: Skin is warm.     Capillary Refill: Capillary refill takes less than 2 seconds.  Neurological:      General: No focal deficit present.     Mental Status: She is alert and oriented to person, place, and time.  Psychiatric:        Mood and Affect: Mood normal.        Behavior: Behavior normal.        Thought Content: Thought content normal.  Judgment: Judgment normal.     BP 128/82   Pulse 69   Temp 98 F (36.7 C)   Resp 16   Ht 5' (1.524 m)   Wt 162 lb 8 oz (73.7 kg)   LMP  (LMP Unknown)   SpO2 97%   BMI 31.74 kg/m  Wt Readings from Last 3 Encounters:  02/25/22 167 lb (75.8 kg)  02/18/22 162 lb 8 oz (73.7 kg)  02/08/22 163 lb (73.9 kg)     Health Maintenance Due  Topic Date Due   MAMMOGRAM  Never done    There are no preventive care reminders to display for this patient.  Lab Results  Component Value Date   TSH 3.01 02/18/2022   Lab Results  Component Value Date   WBC 4.6 02/18/2022   HGB 12.5 02/18/2022   HCT 38.2 02/18/2022   MCV 87.0 02/18/2022   PLT 278.0 02/18/2022   Lab Results  Component Value Date   NA 138 02/18/2022   K 4.2 02/18/2022   CO2 28 02/18/2022   GLUCOSE 95 02/18/2022   BUN 19 02/18/2022   CREATININE 0.75 02/18/2022   BILITOT 0.2 02/18/2022   ALKPHOS 77 02/18/2022   AST 14 02/18/2022   ALT 12 02/18/2022   PROT 6.6 02/18/2022   ALBUMIN 4.0 02/18/2022   CALCIUM 9.0 02/18/2022   ANIONGAP 7 09/04/2020   GFR 89.70 02/18/2022   Lab Results  Component Value Date   HGBA1C 5.9 09/09/2020      Assessment & Plan:   Problem List Items Addressed This Visit       Digestive   Chronic constipation    Chronic  Did advise pt to work on bowel regimen  Recommend daily miralax if needed Increase water intake  If ongoing maybe worth a referral to GI. Pt to f/u with PCP if ongoing.       GERD (gastroesophageal reflux disease)   Relevant Medications   pantoprazole (PROTONIX) 40 MG tablet     Other   Current severe episode of major depressive disorder with psychotic features ([place])    Stable, however symptomatic with increase  in prozac Advised to f/u with psychiatry and discuss maybe decreasing prozac back to 40  Continue f/u with psychiatry and psychology  Did advise       Vitamin D deficiency   Relevant Orders   VITAMIN D 25 Hydroxy (Vit-D Deficiency, Fractures) (Completed)   Persistent cough for 3 weeks or longer    Suspected gerd Stop omeprazole  Start pantoprazole 40 mgonce daily       Neck stiffness    Suspected neck spasm, advise neck exercises, heat to site, and rx sent in for flexeril prn       Other Visit Diagnoses     Other fatigue    -  Primary   Relevant Orders   TSH (Completed)   CBC (Completed)   Vitamin B12 (Completed)   Comprehensive metabolic panel (Completed)   Brain fog       Relevant Orders   TSH (Completed)   CBC (Completed)   Vitamin B12 (Completed)   Comprehensive metabolic panel (Completed)   Muscle spasm       Relevant Medications   cyclobenzaprine (FLEXERIL) 5 MG tablet       Meds ordered this encounter  Medications   pantoprazole (PROTONIX) 40 MG tablet    Sig: Take 1 tablet (40 mg total) by mouth daily.    Dispense:  30 tablet    Refill:  0    Order Specific Question:   Supervising Provider    Answer:   BEDSOLE, AMY E [[id number]]   cyclobenzaprine (FLEXERIL) 5 MG tablet    Sig: Take one po qhs prn muscle spasm    Dispense:  30 tablet    Refill:  0    Order Specific Question:   Supervising Provider    Answer:   Diona Browner, AMY E [[id number]]    Follow-up: Return in about 1 week (around 02/25/2022) for with Allie Bossier for her FMLA form .    Eugenia Pancoast, FNP

## 2022-02-18 NOTE — Patient Instructions (Addendum)
Stop by the lab prior to leaving today. I will notify you of your results once received.   Try trial without macrobid for hte next few days to see if symptoms improve might be intolerance to medication.  If no change in symptoms restart after the weekend.   Stop omeprazole and change to pantoprazole.   Reach out to psychiatrist to let them know about zoning out and brain fog, may be due to increase in prozac.    ------------------------------------ Constipation:  Add fiber supplement once daily.  Add a probiotic (such as Florastor) daily. Drink 64 oz of water a day. Eat lots of fresh fruit and veggies. Ensure regular exercise.    If you are not able to have regular BM's with the above regimen, you may add miralax 1 tablespoon daily.  Increase or decrease amount/frequency as needed to ensure 1 soft BM/day.    ------------------------------------  Work on neck exercises.  Muscle relaxer as needed, at night time because will make you tired.  Warm heat compresses or pad on neck.   ------------------------------------      Regards,   Eugenia Pancoast FNP-C

## 2022-02-22 ENCOUNTER — Other Ambulatory Visit: Payer: Self-pay | Admitting: Family

## 2022-02-22 DIAGNOSIS — I10 Essential (primary) hypertension: Secondary | ICD-10-CM

## 2022-02-25 ENCOUNTER — Ambulatory Visit: Payer: BC Managed Care – PPO | Admitting: Primary Care

## 2022-02-25 ENCOUNTER — Encounter: Payer: Self-pay | Admitting: Primary Care

## 2022-02-25 VITALS — BP 116/68 | HR 95 | Temp 98.2°F | Ht 60.0 in | Wt 167.0 lb

## 2022-02-25 DIAGNOSIS — F22 Delusional disorders: Secondary | ICD-10-CM

## 2022-02-25 DIAGNOSIS — F333 Major depressive disorder, recurrent, severe with psychotic symptoms: Secondary | ICD-10-CM

## 2022-02-25 NOTE — Patient Instructions (Signed)
Follow up with your therapist and psychiatrist as discussed.  It was a pleasure to see you today!

## 2022-02-25 NOTE — Progress Notes (Signed)
Subjective:    Patient ID: [name], female    DOB: 05-22-1965, 56 y.o.   MRN: [id number]  HPI  [name] is a very pleasant 56 y.o. female with a history of migraines, hypertension, paranoid delusion, MDD who presents today for follow up of depression.   She was last evaluated on 01/21/22 for follow up of depression. During this visit she didn't feel ready to return to work on 01/27/22. She had yet to meet with psychiatry and had missed her recent therapy appointment. During this visit we extended her FMLA through 02/23/22 with return to work date of 02/26/22. Her fluoxetine 40 mg was continued. She was initiated on bupropion SR 100 mg BID.  Since her last visit she was evaluated by psychiatry. Her fluoxetine was increased to 60 mg. Her bupropion was discontinued for the time being. She was advised to return on 04/01/22 for follow up. Today she feels like "I zone out a lot". She continues with paranoid thoughts, does feel like she has more control over thoughts. Her overall mood has improved, she feels she's making progress. She has yet to meet with her therapist.   She is supposed to return to work tomorrow, she doesn't feel ready to return. She wants to feel ready to return to work, but isn't there yet. She doesn't feel ready to return because she cannot control a lot of her depressive and paranoid thoughts. She thinks she may be able to return part time.    Review of Systems  Respiratory:  Negative for shortness of breath.   Cardiovascular:  Negative for chest pain.  Psychiatric/Behavioral:  Negative for sleep disturbance.        See HPI         Past Medical History:  Diagnosis Date   Anemia    BV (bacterial vaginosis) 11/27/2012   Celiac disease    Cough due to ACE inhibitor 04/25/2019   Depression    Essential hypertension    Family history of adverse reaction to anesthesia    MOM-HARD TIME WAKING UP   GERD (gastroesophageal reflux disease)    Migraine with visual  aura    MIGRAINES   UTI (lower urinary tract infection)     Social History   Socioeconomic History   Marital status: Married    Spouse name: [name]   Number of children: 3   Years of education: 12   Highest education level: High school graduate  Occupational History   Occupation: Personnel officer  Tobacco Use   Smoking status: Never   Smokeless tobacco: Never  Scientific laboratory technician Use: Never used  Substance and Sexual Activity   Alcohol use: No    Alcohol/week: 0.0 standard drinks of alcohol   Drug use: No   Sexual activity: Yes    Partners: Female    Birth control/protection: Surgical    Comment: INTERCOURSE AGE 35, SEXUAL PARTNERS LEES THAN 5  Other Topics Concern   Not on file  Social History Narrative   Lives with her husband and their three children, and her daughter's boyfriend.  Her older son's daughter lives there part-time as well.   Social Determinants of Health   Financial Resource Strain: Not on file  Food Insecurity: Not on file  Transportation Needs: Not on file  Physical Activity: Not on file  Stress: Not on file  Social Connections: Not on file  Intimate Partner Violence: Not on file    Past Surgical History:  Procedure Laterality  Date   BLADDER SUSPENSION     EXPLORATORY LAPAROTOMY     IUD REMOVAL  07/25/2017   Procedure: INTRAUTERINE DEVICE (IUD) REMOVAL;  Surgeon: Harlin Heys, MD;  Location: [facility] ORS;  Service: Gynecology;;   LAPAROSCOPIC ASSISTED VAGINAL HYSTERECTOMY Bilateral 07/25/2017   Procedure: LAPAROSCOPIC ASSISTED VAGINAL HYSTERECTOMY WITH BILATERAL [place] OOPHERECTOMY;  Surgeon: Harlin Heys, MD;  Location: [facility] ORS;  Service: Gynecology;  Laterality: Bilateral;   TUBAL LIGATION      Family History  Problem Relation Age of Onset   Hypertension Mother    Stroke Mother    Hypertension Maternal Grandfather    Hypertension Maternal Grandmother    Colon cancer Maternal Grandmother    Breast cancer Maternal  Grandmother    Skin cancer Maternal Grandmother    Hypertension Paternal Grandfather    Hypertension Paternal Grandmother    Diabetes Paternal Grandmother    Diabetes Son 12       type 1    Allergies  Allergen Reactions   Topamax [Topiramate]     "cant function"    Tramadol     Cant function with it    Gluten Meal Diarrhea and Nausea Only        Ondansetron Other (See Comments)    Helps her nausea, but makes HA worse    Current Outpatient Medications on File Prior to Visit  Medication Sig Dispense Refill   cyclobenzaprine (FLEXERIL) 5 MG tablet Take one po qhs prn muscle spasm 30 tablet 0   FLUoxetine (PROZAC) 20 MG capsule Take 1 capsule (20 mg total) by mouth daily. 30 capsule 1   FLUoxetine (PROZAC) 40 MG capsule Take 1 capsule (40 mg total) by mouth daily. for anxiety and depression. 90 capsule 0   losartan (COZAAR) 50 MG tablet TAKE 1 TABLET (50 MG TOTAL) BY MOUTH DAILY. FOR BLOOD PRESSURE. 90 tablet 0   nitrofurantoin (MACRODANTIN) 100 MG capsule Take 1 capsule (100 mg total) by mouth daily. 30 capsule 11   pantoprazole (PROTONIX) 40 MG tablet Take 1 tablet (40 mg total) by mouth daily. 30 tablet 0   [DISCONTINUED] atorvastatin (LIPITOR) 20 MG tablet Take 1 tablet (20 mg total) by mouth every evening. For cholesterol. 90 tablet 3   [DISCONTINUED] traZODone (DESYREL) 50 MG tablet      No current facility-administered medications on file prior to visit.    BP 116/68   Pulse 95   Temp 98.2 F (36.8 C) (Temporal)   Ht 5' (1.524 m)   Wt 167 lb (75.8 kg)   LMP  (LMP Unknown)   SpO2 98%   BMI 32.61 kg/m  Objective:   Physical Exam Cardiovascular:     Rate and Rhythm: Normal rate and regular rhythm.  Pulmonary:     Effort: Pulmonary effort is normal.     Breath sounds: Normal breath sounds.  Musculoskeletal:     Cervical back: Neck supple.  Skin:    General: Skin is warm and dry.  Psychiatric:        Mood and Affect: Mood normal.           Assessment &  Plan:   Problem List Items Addressed This Visit       Other   Current severe episode of major depressive disorder with psychotic features ([place]) - Primary    Seems to be improving based on today's conversation.  Continue fluoxetine 60 mg daily per psychiatry.  Remain off of bupropion for now.  I did encourage her to  schedule a follow up visit with therapy.  Reviewed psychiatry notes from psychiatry from October 2023.  She agrees to return to work part time with start date of 03/08/22, working from 7a-12 p three days weekly for 4 weeks.          Paranoid delusion ([place])    Seems to be improving based on today's conversation.  Continue fluoxetine 60 mg daily per psychiatry.  Remain off of bupropion for now.  Reviewed psychiatry notes from psychiatry from October 2023.  She agrees to return to work part time with start date of 03/08/22, working from 7a-12 p three days weekly for 4 weeks.           [name], NP

## 2022-02-25 NOTE — Assessment & Plan Note (Signed)
Seems to be improving based on today's conversation.  Continue fluoxetine 60 mg daily per psychiatry.  Remain off of bupropion for now.  Reviewed psychiatry notes from psychiatry from October 2023.  She agrees to return to work part time with start date of 03/08/22, working from 7a-12 p three days weekly for 4 weeks.

## 2022-02-25 NOTE — Assessment & Plan Note (Addendum)
Seems to be improving based on today's conversation.  Continue fluoxetine 60 mg daily per psychiatry.  Remain off of bupropion for now.  I did encourage her to schedule a follow up visit with therapy.  Reviewed psychiatry notes from psychiatry from October 2023.  She agrees to return to work part time with start date of 03/08/22, working from 7a-12 p three days weekly for 4 weeks.

## 2022-02-26 ENCOUNTER — Other Ambulatory Visit: Payer: Self-pay | Admitting: Psychiatry

## 2022-02-26 DIAGNOSIS — M436 Torticollis: Secondary | ICD-10-CM | POA: Insufficient documentation

## 2022-02-26 NOTE — Assessment & Plan Note (Signed)
Suspected gerd Stop omeprazole  Start pantoprazole 40 mgonce daily

## 2022-02-26 NOTE — Assessment & Plan Note (Signed)
Suspected neck spasm, advise neck exercises, heat to site, and rx sent in for flexeril prn

## 2022-02-26 NOTE — Assessment & Plan Note (Signed)
Chronic  Did advise pt to work on bowel regimen  Recommend daily miralax if needed Increase water intake  If ongoing maybe worth a referral to GI. Pt to f/u with PCP if ongoing.

## 2022-02-26 NOTE — Assessment & Plan Note (Signed)
Stable, however symptomatic with increase in prozac Advised to f/u with psychiatry and discuss maybe decreasing prozac back to 40  Continue f/u with psychiatry and psychology  Did advise

## 2022-03-20 ENCOUNTER — Other Ambulatory Visit: Payer: Self-pay | Admitting: Family

## 2022-03-20 DIAGNOSIS — K219 Gastro-esophageal reflux disease without esophagitis: Secondary | ICD-10-CM

## 2022-03-21 NOTE — Telephone Encounter (Signed)
Received refill request for pantoprazole 40 mg which is for heartburn. How is she doing on this medication for heartburn?

## 2022-03-22 ENCOUNTER — Encounter: Payer: Self-pay | Admitting: Urology

## 2022-03-22 ENCOUNTER — Other Ambulatory Visit: Payer: BC Managed Care – PPO | Admitting: Urology

## 2022-03-22 NOTE — Telephone Encounter (Signed)
Unable to reach patient. Left voicemail to return call to our office.

## 2022-03-24 NOTE — Telephone Encounter (Signed)
Unable to reach patient. Left voicemail to return call to our office.

## 2022-03-24 NOTE — Telephone Encounter (Signed)
Called and spoke with patient, she states the medication has helped a lot, she takes it daily and hasn't had much issue with heartburn since taking it.

## 2022-03-27 ENCOUNTER — Other Ambulatory Visit: Payer: Self-pay | Admitting: Primary Care

## 2022-03-27 DIAGNOSIS — F22 Delusional disorders: Secondary | ICD-10-CM

## 2022-03-27 DIAGNOSIS — F333 Major depressive disorder, recurrent, severe with psychotic symptoms: Secondary | ICD-10-CM

## 2022-03-30 NOTE — Progress Notes (Deleted)
[place] MD/PA/NP OP Progress Note  03/30/2022 5:42 PM [name]  MRN:  [id number]  Chief Complaint: No chief complaint on file.  HPI: *** Visit Diagnosis: No diagnosis found.  Past Psychiatric History: Please see initial evaluation for full details. I have reviewed the history. No updates at this time.     Past Medical History:  Past Medical History:  Diagnosis Date   Anemia    BV (bacterial vaginosis) 11/27/2012   Celiac disease    Cough due to ACE inhibitor 04/25/2019   Depression    Essential hypertension    Family history of adverse reaction to anesthesia    MOM-HARD TIME WAKING UP   GERD (gastroesophageal reflux disease)    Migraine with visual aura    MIGRAINES   UTI (lower urinary tract infection)     Past Surgical History:  Procedure Laterality Date   BLADDER SUSPENSION     EXPLORATORY LAPAROTOMY     IUD REMOVAL  07/25/2017   Procedure: INTRAUTERINE DEVICE (IUD) REMOVAL;  Surgeon: Harlin Heys, MD;  Location: [facility] ORS;  Service: Gynecology;;   LAPAROSCOPIC ASSISTED VAGINAL HYSTERECTOMY Bilateral 07/25/2017   Procedure: LAPAROSCOPIC ASSISTED VAGINAL HYSTERECTOMY WITH BILATERAL [place] OOPHERECTOMY;  Surgeon: Harlin Heys, MD;  Location: [facility] ORS;  Service: Gynecology;  Laterality: Bilateral;   TUBAL LIGATION      Family Psychiatric History: Please see initial evaluation for full details. I have reviewed the history. No updates at this time.     Family History:  Family History  Problem Relation Age of Onset   Hypertension Mother    Stroke Mother    Hypertension Maternal Grandfather    Hypertension Maternal Grandmother    Colon cancer Maternal Grandmother    Breast cancer Maternal Grandmother    Skin cancer Maternal Grandmother    Hypertension Paternal Grandfather    Hypertension Paternal Grandmother    Diabetes Paternal Grandmother    Diabetes Son 3       type 1    Social History:  Social History   Socioeconomic History   Marital status:  Married    Spouse name: [name]   Number of children: 3   Years of education: 12   Highest education level: High school graduate  Occupational History   Occupation: Personnel officer  Tobacco Use   Smoking status: Never   Smokeless tobacco: Never  Scientific laboratory technician Use: Never used  Substance and Sexual Activity   Alcohol use: No    Alcohol/week: 0.0 standard drinks of alcohol   Drug use: No   Sexual activity: Yes    Partners: Female    Birth control/protection: Surgical    Comment: INTERCOURSE AGE 61, SEXUAL PARTNERS LEES THAN 6  Other Topics Concern   Not on file  Social History Narrative   Lives with her husband and their three children, and her daughter's boyfriend.  Her older son's daughter lives there part-time as well.   Social Determinants of Health   Financial Resource Strain: Not on file  Food Insecurity: Not on file  Transportation Needs: Not on file  Physical Activity: Not on file  Stress: Not on file  Social Connections: Not on file    Allergies:  Allergies  Allergen Reactions   Topamax [Topiramate]     "cant function"    Tramadol     Cant function with it    Gluten Meal Diarrhea and Nausea Only        Ondansetron Other (See Comments)  Helps her nausea, but makes HA worse    Metabolic Disorder Labs: Lab Results  Component Value Date   HGBA1C 5.9 09/09/2020   MPG 108.28 02/24/2019   Lab Results  Component Value Date   PROLACTIN 7.6 06/19/2014   Lab Results  Component Value Date   CHOL 201 (H) 09/09/2020   TRIG 138.0 09/09/2020   HDL 39.50 09/09/2020   CHOLHDL 5 09/09/2020   VLDL 27.6 09/09/2020   LDLCALC 134 (H) 09/09/2020   LDLCALC 65 08/29/2019   Lab Results  Component Value Date   TSH 3.01 02/18/2022   TSH 1.79 11/27/2021    Therapeutic Level Labs: No results found for: "LITHIUM" No results found for: "VALPROATE" No results found for: "CBMZ"  Current Medications: Current Outpatient Medications  Medication Sig  Dispense Refill   cyclobenzaprine (FLEXERIL) 5 MG tablet Take one po qhs prn muscle spasm 30 tablet 0   FLUoxetine (PROZAC) 20 MG capsule Take 1 capsule (20 mg total) by mouth daily. 30 capsule 1   FLUoxetine (PROZAC) 40 MG capsule TAKE 1 CAPSULE (40 MG TOTAL) BY MOUTH DAILY. FOR ANXIETY AND DEPRESSION. 90 capsule 1   losartan (COZAAR) 50 MG tablet TAKE 1 TABLET (50 MG TOTAL) BY MOUTH DAILY. FOR BLOOD PRESSURE. 90 tablet 0   nitrofurantoin (MACRODANTIN) 100 MG capsule Take 1 capsule (100 mg total) by mouth daily. 30 capsule 11   pantoprazole (PROTONIX) 40 MG tablet Take 1 tablet (40 mg total) by mouth daily. For heartburn. 90 tablet 1   No current facility-administered medications for this visit.     Musculoskeletal: Strength & Muscle Tone: within normal limits Gait & Station: normal Patient leans: N/A  Psychiatric Specialty Exam: Review of Systems  There were no vitals taken for this visit.There is no height or weight on file to calculate BMI.  General Appearance: {Appearance:22683}  Eye Contact:  {BHH EYE CONTACT:22684}  Speech:  Clear and Coherent  Volume:  Normal  Mood:  {BHH MOOD:22306}  Affect:  {Affect (PAA):22687}  Thought Process:  Coherent  Orientation:  Full (Time, Place, and Person)  Thought Content: Logical   Suicidal Thoughts:  {ST/HT (PAA):22692}  Homicidal Thoughts:  {ST/HT (PAA):22692}  Memory:  Immediate;   Good  Judgement:  {Judgement (PAA):22694}  Insight:  {Insight (PAA):22695}  Psychomotor Activity:  Normal  Concentration:  Concentration: Good and Attention Span: Good  Recall:  Good  Fund of Knowledge: Good  Language: Good  Akathisia:  No  Handed:  Right  AIMS (if indicated): not done  Assets:  Communication Skills Desire for Improvement  ADL's:  Intact  Cognition: WNL  Sleep:  {BHH GOOD/FAIR/POOR:22877}   Screenings: GAD-7    Flowsheet Row Office Visit from 02/25/2022 in [place] at [place] Visit from 02/04/2022 in  [place]  Total GAD-7 Score 10 20      PHQ2-9    [place] Visit from 02/25/2022 in [place] [facility] [place] Visit from 02/04/2022 in [place] Office Visit from 12/28/2021 in [facility] Visit from 09/09/2020 in [facility] [place] from 01/24/2019 in [facility] [facility] Total Score 4 5 2 6 6   PHQ-9 Total Score 15 21 7 18 20       Flowsheet Row Office Visit from 02/04/2022 in [place] ED from 01/02/2022 in [facility] from 07/23/2021 in [facility] at [facility]  RISK CATEGORY Low Risk No Risk No Risk        Assessment and Plan:  [name] is a 56 y.o. year old female with a history of depression, hypertension, hyperlipidemia, iron deficiency anemia, who presents for follow up appointment for below.     1. Obsessive-compulsive disorder, unspecified type 2. Severe episode of recurrent major depressive disorder, without psychotic features ([place]) She reports ego-dystonic obsessive thoughts about the infidelity of her husband, which has worsened over the past several months without significant triggers.  She reports good relationship with her family.  Will uptitrate fluoxetine to optimize treatment for OCD and depression given she reports good benefit from this medication.  Will hold bupropion at this time to avoid polypharmacy, although medication can be considered in the future.  She will greatly benefit from CBT; she was referred by her PCP.  She agrees to contact the clinic to make an appointment.    Plan Increase fluoxetine 60 mg daily  Hold bupropion (was on 100 mg twice a day, started a week ago) Next appointment: 12/21 at 9:30 for 30 mins, in person   The patient demonstrates the following risk factors for suicide: Chronic risk factors for suicide  include: psychiatric disorder of depression . Acute risk factors for suicide include: N/A. Protective factors for this patient include: positive social support and hope for the future. Considering these factors, the overall suicide risk at this point appears to be low. Patient is appropriate for outpatient follow up.             Collaboration of Care: Collaboration of Care: {BH OP Collaboration of Care:21014065}  Patient/Guardian was advised Release of Information must be obtained prior to any record release in order to collaborate their care with an outside provider. Patient/Guardian was advised if they have not already done so to contact the registration department to sign all necessary forms in order for [name] to release information regarding their care.   Consent: Patient/Guardian gives verbal consent for treatment and assignment of benefits for services provided during this visit. Patient/Guardian expressed understanding and agreed to proceed.    Norman Clay, MD 03/30/2022, 5:42 PM

## 2022-04-01 ENCOUNTER — Ambulatory Visit: Payer: BC Managed Care – PPO | Admitting: Psychiatry

## 2022-04-11 ENCOUNTER — Other Ambulatory Visit: Payer: Self-pay | Admitting: Family

## 2022-04-11 ENCOUNTER — Other Ambulatory Visit: Payer: Self-pay | Admitting: Primary Care

## 2022-04-11 DIAGNOSIS — F333 Major depressive disorder, recurrent, severe with psychotic symptoms: Secondary | ICD-10-CM

## 2022-04-11 DIAGNOSIS — M62838 Other muscle spasm: Secondary | ICD-10-CM

## 2022-04-11 DIAGNOSIS — I10 Essential (primary) hypertension: Secondary | ICD-10-CM

## 2022-04-21 ENCOUNTER — Ambulatory Visit (INDEPENDENT_AMBULATORY_CARE_PROVIDER_SITE_OTHER): Payer: BC Managed Care – PPO | Admitting: Primary Care

## 2022-04-21 ENCOUNTER — Encounter: Payer: Self-pay | Admitting: Primary Care

## 2022-04-21 VITALS — BP 130/80 | HR 95 | Temp 98.6°F | Ht 60.0 in | Wt 168.0 lb

## 2022-04-21 DIAGNOSIS — D509 Iron deficiency anemia, unspecified: Secondary | ICD-10-CM | POA: Diagnosis not present

## 2022-04-21 DIAGNOSIS — K219 Gastro-esophageal reflux disease without esophagitis: Secondary | ICD-10-CM | POA: Diagnosis not present

## 2022-04-21 DIAGNOSIS — F333 Major depressive disorder, recurrent, severe with psychotic symptoms: Secondary | ICD-10-CM | POA: Diagnosis not present

## 2022-04-21 DIAGNOSIS — R112 Nausea with vomiting, unspecified: Secondary | ICD-10-CM

## 2022-04-21 NOTE — Patient Instructions (Signed)
Stop by the lab prior to leaving today. I will notify you of your results once received.   Please call your psychiatrist and therapist to set up your appointment.   It was a pleasure to see you today!

## 2022-04-21 NOTE — Assessment & Plan Note (Signed)
Repeat iron studies and CBC pending.

## 2022-04-21 NOTE — Assessment & Plan Note (Signed)
Symptoms suggestive of GERD vs increased/stress. HPI and exam without signs of acute infection/process.  Checking CBC and lipase today. Resume pantoprazole 40 mg daily.   Follow up with GI as scheduled.

## 2022-04-21 NOTE — Progress Notes (Signed)
Established Patient Office Visit  Subjective   Patient ID: [name], female    DOB: [birth year]  Age: 57 y.o. MRN: [id number]  Chief Complaint  Patient presents with   Emesis    Vomiting every evening for a couple weeks. This tends to happen when she gets ready for bed. She notices that after she eats she gets nauseated and then eventually vomits.  Has a dry cough throughout the day that gets worse at night when she lays down    Itching     Hands and wrist start itching when she is in a stressful situation. This has happened every day since she went back to work full time the first week in January.     Emesis  Associated symptoms include headaches. Pertinent negatives include no chest pain, chills, diarrhea, dizziness or fever.    [name] is a 57 year old female with past medical history of migraine with visual aura, hypertension, GERD, recurrent cystitis, HLD, MDD, IDA presents today to discuss emesis x two weeks.   Her symptoms started couple weeks ago. Usually occurs at night right before bed. Starts out with nausea and then eventually vomits. She has a history of celiac disease, however she is still eating glueten. She has been vomiting everyday 3-4 times a day with a lot of dry heaving. She is vomiting up food and mucus. She does not know if she has seen any blood in her vomit. Vomiting usually occurs after she has eaten. If she waits too long to eat, she experiences burning down her esophagus, then she dry heaves and eventually vomits. She stopped taking her Protonix and nitrofurantoin due to her financial difficulties. Denies any epigastric pain. She was able to resume the medication since her mother had some of the same dosage. She denies any issues with eating or drinking.   She has noticed that she has been experiencing more anxiety lately due to family stress and when she went back to work. She is going through some legal issues with her niece and is currently being sued.  She is currently managed on prozac and feels that it does help her anxiety. She has been experiencing itching when she is triggered. She has excoriation marks on her hands. Feels that her depression is getting worse and that she can't think clear. She is followed by psychiatry and states that she will call and make an appointment. She denies SI/HI.   She saw her psychiatrist in October, 2023 who increased her Prozac from 40 mg to 60 mg and recommended CBT. She has not been able to go to the psychologist or the psychiatrist due to financial issues. Feels that work has been a major stressor for her and would like to go back to work part-time starting tomorrow.   Patient Active Problem List   Diagnosis Date Noted   Neck stiffness 02/26/2022   Persistent cough for 3 weeks or longer 01/21/2022   Iron deficiency anemia 11/30/2021   Current severe episode of major depressive disorder with psychotic features ([place]) 11/27/2021   Vitamin D deficiency 11/27/2021   Paranoid delusion ([place]) 11/27/2021   GERD (gastroesophageal reflux disease) 04/25/2019   Hyperlipidemia 03/02/2019   Essential hypertension 02/18/2019   Recurrent cystitis 11/27/2012   Migraine with visual aura 09/19/2011   Chronic constipation 06/25/2011   Past Medical History:  Diagnosis Date   Anemia    BV (bacterial vaginosis) 11/27/2012   Celiac disease    Cough due to ACE inhibitor 04/25/2019  Depression    Essential hypertension    Family history of adverse reaction to anesthesia    MOM-HARD TIME WAKING UP   GERD (gastroesophageal reflux disease)    Migraine with visual aura    MIGRAINES   UTI (lower urinary tract infection)    Past Surgical History:  Procedure Laterality Date   BLADDER SUSPENSION     EXPLORATORY LAPAROTOMY     IUD REMOVAL  07/25/2017   Procedure: INTRAUTERINE DEVICE (IUD) REMOVAL;  Surgeon: Harlin Heys, MD;  Location: [facility] ORS;  Service: Gynecology;;   LAPAROSCOPIC ASSISTED VAGINAL HYSTERECTOMY  Bilateral 07/25/2017   Procedure: LAPAROSCOPIC ASSISTED VAGINAL HYSTERECTOMY WITH BILATERAL [place] OOPHERECTOMY;  Surgeon: Harlin Heys, MD;  Location: [facility] ORS;  Service: Gynecology;  Laterality: Bilateral;   TUBAL LIGATION     Social History   Tobacco Use   Smoking status: Never   Smokeless tobacco: Never  Vaping Use   Vaping Use: Never used  Substance Use Topics   Alcohol use: No    Alcohol/week: 0.0 standard drinks of alcohol   Drug use: No   Family History  Problem Relation Age of Onset   Hypertension Mother    Stroke Mother    Hypertension Maternal Grandfather    Hypertension Maternal Grandmother    Colon cancer Maternal Grandmother    Breast cancer Maternal Grandmother    Skin cancer Maternal Grandmother    Hypertension Paternal Grandfather    Hypertension Paternal Grandmother    Diabetes Paternal Grandmother    Diabetes Son 12       type 1   Allergies  Allergen Reactions   Topamax [Topiramate]     "cant function"    Tramadol     Cant function with it    Gluten Meal Diarrhea and Nausea Only        Ondansetron Other (See Comments)    Helps her nausea, but makes HA worse      Review of Systems  Constitutional:  Negative for chills and fever.  Respiratory:  Negative for shortness of breath.   Cardiovascular:  Negative for chest pain.  Gastrointestinal:  Positive for heartburn, nausea and vomiting. Negative for constipation and diarrhea.  Neurological:  Positive for headaches. Negative for dizziness and weakness.  Psychiatric/Behavioral:  Positive for depression. Negative for suicidal ideas. The patient is nervous/anxious.       Objective:     BP 130/80   Pulse 95   Temp 98.6 F (37 C) (Temporal)   Ht 5' (1.524 m)   Wt 168 lb (76.2 kg)   LMP  (LMP Unknown)   SpO2 98%   BMI 32.81 kg/m  BP Readings from Last 3 Encounters:  04/21/22 130/80  02/25/22 116/68  02/18/22 128/82   Wt Readings from Last 3 Encounters:  04/21/22 168 lb (76.2 kg)   02/25/22 167 lb (75.8 kg)  02/18/22 162 lb 8 oz (73.7 kg)      Physical Exam Vitals and nursing note reviewed.  Cardiovascular:     Rate and Rhythm: Normal rate and regular rhythm.     Pulses: Normal pulses.     Heart sounds: Normal heart sounds.  Pulmonary:     Effort: Pulmonary effort is normal.     Breath sounds: Normal breath sounds.  Abdominal:     General: Bowel sounds are normal.  Neurological:     Mental Status: She is alert.  Psychiatric:        Mood and Affect: Mood normal.  Comments: Appears anxious      No results found for any visits on 04/21/22.     The 10-year ASCVD risk score (Arnett DK, et al., 2019) is: 3.8%    Assessment & Plan:   Problem List Items Addressed This Visit       Digestive   GERD (gastroesophageal reflux disease)    No alarm signs on exam.   Continue Pantoprazole 40 mg daily.   Has an appt to see GI on 05/06/22.        Other   Current severe episode of major depressive disorder with psychotic features ([place])    Uncontrolled. Followed by psychiatry. Reviewed notes from October, 2023.   Long discussion with patient about the importance of following with the psychiatrist and the therapist.  Sent a letter for work for patient to work part-time three days a week 7:30-1:30 pm for three months. Patient is aware that no more FMLA will be given until she has followed up with psychiatrist and therapist.  Continue Prozac 60 mg daily.        Iron deficiency anemia - Primary   Relevant Orders   CBC   Iron, TIBC and Ferritin Panel    No follow-ups on file.    Tinnie Gens, BSN-RN, DNP STUDENT

## 2022-04-21 NOTE — Assessment & Plan Note (Addendum)
Uncontrolled. Followed by psychiatry. Reviewed notes from October, 2023.   Long discussion with patient about the importance of following with the psychiatrist and the therapist as she's failed to do so.  Work note provided to patient to resume work part-time three days a week 7:30-1:30 pm for three months. Patient is aware that no more FMLA will be given until she has followed up with psychiatrist and therapist.  Continue Prozac 60 mg daily.   I evaluated patient, was consulted regarding treatment, and agree with assessment and plan per Tinnie Gens, RN, DNP student.   Allie Bossier, NP-C'

## 2022-04-21 NOTE — Assessment & Plan Note (Addendum)
Recent symptoms suggestive of uncontrolled which is likely triggered by her uncontrolled anxiety/stress.  No alarm signs on exam.   Resume Pantoprazole 40 mg daily, she will be able to afford moving forward.   Follow up with GI on 05/06/22. Discussed to mention upper GI symptoms, perhaps she needs repeat testing for Celiac.   I evaluated patient, was consulted regarding treatment, and agree with assessment and plan per Tinnie Gens, RN, DNP student.   Allie Bossier, NP-C'

## 2022-04-21 NOTE — Addendum Note (Signed)
Addended by: Pilar Grammes on: 04/21/2022 04:28 PM   Modules accepted: Orders

## 2022-04-21 NOTE — Progress Notes (Signed)
Subjective:    Patient ID: [name], female    DOB: Apr 05, 1966, 57 y.o.   MRN: [id number]  Emesis  Pertinent negatives include no abdominal pain, chest pain, chills or fever.    [name] is a very pleasant 58 y.o. female  has a past medical history of Anemia, BV (bacterial vaginosis) (11/27/2012), Celiac disease, Cough due to ACE inhibitor (04/25/2019), Depression, Essential hypertension, Family history of adverse reaction to anesthesia, GERD (gastroesophageal reflux disease), Migraine with visual aura, and UTI (lower urinary tract infection). who presents today to discuss nausea and vomiting. She is also requesting to resume FMLA  with part time work.   Symptom onset two weeks ago. She is vomiting 3-4 times daily, typically after a meal, but will sometimes notice esophageal burning with dry heaving without meals.  She stopped taking pantoprazole 40 mg daily about two weeks ago as it has become cost prohibitive. Yesterday she took one of her mother's 40 mg of pantoprazole, still had some vomiting last night.   She tells [name] today of a prior history of "celiac disease" but eats food with gluten often. She has an appointment to establish with GI later this month.   She believes that her symptoms are secondary to increased stress. She going through some legal issues with her family, has experienced increased stress at work since returning full time about 2-3 weeks ago. During episodes of anxiety she will experience itching which has caused excoriation to her skin. She follows with psychiatry, is managed on fluoxetine 60 mg daily which historically helps, but symptoms have increased recently. She does feel that her paranoia of infidelity with her husband has improved.   Her last appointment with psychiatry was her establish care visit on 02/04/22, recommendation was to increase fluoxetine to 60 mg daily and to hold bupropion for now. There was also a recommendation for follow up in December 2023  but she did not follow through. She has not followed back up with her therapist as recommended during prior visits.   She denies fevers, chills, changes in bowel habits, bloody stools. She believes her iron may be low again. Is no loner on iron supplements.   She would like to resume part time work through [employer]. She did well when returning to work part time, working three days weekly from 7:30 am to 1:30 pm. Symptoms of anxiety increased once she resumed full time work. 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     Review of Systems  Constitutional:  Negative for chills and fever.  Respiratory:  Negative for shortness of breath.   Cardiovascular:  Negative for chest pain.  Gastrointestinal:  Positive for nausea and vomiting. Negative for abdominal pain.  Psychiatric/Behavioral:  The patient is nervous/anxious.        See HPI         Past Medical History:  Diagnosis Date   Anemia    BV (bacterial vaginosis) 11/27/2012   Celiac disease    Cough due to ACE inhibitor 04/25/2019   Depression    Essential hypertension    Family history of adverse reaction to anesthesia    MOM-HARD TIME WAKING UP   GERD (gastroesophageal reflux disease)    Migraine with visual aura    MIGRAINES   UTI (lower urinary tract infection)     Social History   Socioeconomic History   Marital status: Married    Spouse name:  [name]   Number of children: 3   Years of education: 12   Highest education level: High school graduate  Occupational History   Occupation: Personnel officer  Tobacco Use   Smoking status: Never   Smokeless tobacco: Never  Vaping Use   Vaping Use: Never used  Substance and Sexual Activity   Alcohol use: No    Alcohol/week: 0.0 standard drinks of alcohol   Drug use: No   Sexual activity: Yes    Partners: Female    Birth control/protection: Surgical    Comment: INTERCOURSE  AGE 43, SEXUAL PARTNERS LEES THAN 5  Other Topics Concern   Not on file  Social History Narrative   Lives with her husband and their three children, and her daughter's boyfriend.  Her older son's daughter lives there part-time as well.   Social Determinants of Health   Financial Resource Strain: Not on file  Food Insecurity: Not on file  Transportation Needs: Not on file  Physical Activity: Not on file  Stress: Not on file  Social Connections: Not on file  Intimate Partner Violence: Not on file    Past Surgical History:  Procedure Laterality Date   BLADDER SUSPENSION     EXPLORATORY LAPAROTOMY     IUD REMOVAL  07/25/2017   Procedure: INTRAUTERINE DEVICE (IUD) REMOVAL;  Surgeon: Harlin Heys, MD;  Location: [facility] ORS;  Service: Gynecology;;   LAPAROSCOPIC ASSISTED VAGINAL HYSTERECTOMY Bilateral 07/25/2017   Procedure: LAPAROSCOPIC ASSISTED VAGINAL HYSTERECTOMY WITH BILATERAL [place];  Surgeon: Harlin Heys, MD;  Location: [facility] ORS;  Service: Gynecology;  Laterality: Bilateral;   TUBAL LIGATION      Family History  Problem Relation Age of Onset   Hypertension Mother    Stroke Mother    Hypertension Maternal Grandfather    Hypertension Maternal Grandmother    Colon cancer Maternal Grandmother    Breast cancer Maternal Grandmother    Skin cancer Maternal Grandmother    Hypertension Paternal Grandfather    Hypertension Paternal Grandmother    Diabetes Paternal Grandmother    Diabetes Son 12       type 1    Allergies  Allergen Reactions   Topamax [Topiramate]     "cant function"    Tramadol     Cant function with it    Gluten Meal Diarrhea and Nausea Only        Ondansetron Other (See Comments)    Helps her nausea, but makes HA worse    Current Outpatient Medications on File Prior to Visit  Medication Sig Dispense Refill   FLUoxetine (PROZAC) 40 MG capsule TAKE 1 CAPSULE (40 MG TOTAL) BY MOUTH DAILY. FOR ANXIETY AND DEPRESSION. 90 capsule 1    losartan (COZAAR) 50 MG tablet TAKE 1 TABLET (50 MG TOTAL) BY MOUTH DAILY. FOR BLOOD PRESSURE. 90 tablet 0   cyclobenzaprine (FLEXERIL) 5 MG tablet Take one po qhs prn muscle spasm (Patient not taking: Reported on 04/21/2022) 30 tablet 0   FLUoxetine (PROZAC) 20 MG capsule Take 1 capsule (20 mg total) by mouth daily. 30 capsule 1   nitrofurantoin (MACRODANTIN) 100 MG capsule Take 1 capsule (100 mg total) by mouth daily. (Patient not taking: Reported on 04/21/2022) 30 capsule 11   pantoprazole (PROTONIX) 40 MG tablet Take 1 tablet (40 mg total) by mouth daily. For heartburn. (Patient not taking: Reported on 04/21/2022) 90 tablet 1   [DISCONTINUED] atorvastatin (LIPITOR) 20 MG tablet Take 1 tablet (20 mg total) by mouth every evening.  For cholesterol. 90 tablet 3   [DISCONTINUED] traZODone (DESYREL) 50 MG tablet      No current facility-administered medications on file prior to visit.    BP 130/80   Pulse 95   Temp 98.6 F (37 C) (Temporal)   Ht 5' (1.524 m)   Wt 168 lb (76.2 kg)   LMP  (LMP Unknown)   SpO2 98%   BMI 32.81 kg/m  Objective:   Physical Exam Constitutional:      General: She is not in acute distress.    Appearance: She is not ill-appearing.  Cardiovascular:     Rate and Rhythm: Normal rate and regular rhythm.  Pulmonary:     Effort: Pulmonary effort is normal.     Breath sounds: Normal breath sounds.  Abdominal:     General: Bowel sounds are normal.     Palpations: Abdomen is soft.     Tenderness: There is abdominal tenderness in the right upper quadrant, epigastric area and left upper quadrant.  Musculoskeletal:     Cervical back: Neck supple.  Skin:    General: Skin is warm and dry.  Psychiatric:        Mood and Affect: Mood normal.           Assessment & Plan:  Iron deficiency anemia, unspecified iron deficiency anemia type Assessment & Plan: Repeat iron studies and CBC pending.   Orders: -     CBC -     Iron, TIBC and Ferritin Panel  Severe  episode of recurrent major depressive disorder, with psychotic features ([place]) Assessment & Plan: Uncontrolled. Followed by psychiatry. Reviewed notes from October, 2023.   Long discussion with patient about the importance of following with the psychiatrist and the therapist as she's failed to do so.  Work note provided to patient to resume work part-time three days a week 7:30-1:30 pm for three months. Patient is aware that no more FMLA will be given until she has followed up with psychiatrist and therapist.  Continue Prozac 60 mg daily.   I evaluated patient, was consulted regarding treatment, and agree with assessment and plan per Tinnie Gens, RN, DNP student.   Allie Bossier, NP-C'    Gastroesophageal reflux disease, unspecified whether esophagitis present Assessment & Plan: Recent symptoms suggestive of uncontrolled which is likely triggered by her uncontrolled anxiety/stress.  No alarm signs on exam.   Resume Pantoprazole 40 mg daily, she will be able to afford moving forward.   Follow up with GI on 05/06/22. Discussed to mention upper GI symptoms, perhaps she needs repeat testing for Celiac.   I evaluated patient, was consulted regarding treatment, and agree with assessment and plan per Tinnie Gens, RN, DNP student.   Allie Bossier, NP-C'   Nausea and vomiting, unspecified vomiting type Assessment & Plan: Symptoms suggestive of GERD vs increased/stress. HPI and exam without signs of acute infection/process.  Checking CBC and lipase today. Resume pantoprazole 40 mg daily.   Follow up with GI as scheduled.           [name], NP

## 2022-04-22 LAB — CBC
HCT: 34.9 % — ABNORMAL LOW (ref 36.0–46.0)
Hemoglobin: 11.4 g/dL — ABNORMAL LOW (ref 12.0–15.0)
MCHC: 32.8 g/dL (ref 30.0–36.0)
MCV: 85.1 fl (ref 78.0–100.0)
Platelets: 306 10*3/uL (ref 150.0–400.0)
RBC: 4.09 Mil/uL (ref 3.87–5.11)
RDW: 15.3 % (ref 11.5–15.5)
WBC: 4.3 10*3/uL (ref 4.0–10.5)

## 2022-04-22 LAB — IRON,TIBC AND FERRITIN PANEL
%SAT: 8 % (calc) — ABNORMAL LOW (ref 16–45)
Ferritin: 3 ng/mL — ABNORMAL LOW (ref 16–232)
Iron: 32 ug/dL — ABNORMAL LOW (ref 45–160)
TIBC: 403 mcg/dL (calc) (ref 250–450)

## 2022-04-22 LAB — LIPASE: Lipase: 21 U/L (ref 7–60)

## 2022-04-23 ENCOUNTER — Telehealth: Payer: Self-pay | Admitting: Primary Care

## 2022-04-23 DIAGNOSIS — D509 Iron deficiency anemia, unspecified: Secondary | ICD-10-CM

## 2022-04-23 NOTE — Telephone Encounter (Signed)
Called patient and reviewed all information. Patient verbalized understanding. Will call if any further questions.  

## 2022-04-23 NOTE — Telephone Encounter (Signed)
Pt called in to make PCP aware that she would like to be referred to [facility] hematology  . Please advise [phone]

## 2022-04-23 NOTE — Telephone Encounter (Signed)
Noted.  Referral placed to hematology in [place] Please ask patient to start taking ferrous sulfate 325 mg tablets once daily for now until she is seen by hematology.[name]

## 2022-04-23 NOTE — Telephone Encounter (Signed)
Called patient back she would like to know which option Clearence Cheek recommends the most for her. Should she do the oral iron pills or do the infusion. She would like a Pharmacist, community message back from [place] with her advise.

## 2022-04-23 NOTE — Telephone Encounter (Signed)
MyChart message sent to patient as requested. Will await response.

## 2022-04-23 NOTE — Telephone Encounter (Signed)
I believe that she would benefit more from an iron infusion.  I can refer her over to hematology for further evaluation.  [place] or [place]?

## 2022-04-23 NOTE — Telephone Encounter (Signed)
Pt returning call to discuss lab results [phone]

## 2022-04-29 ENCOUNTER — Telehealth: Payer: Self-pay | Admitting: *Deleted

## 2022-04-29 NOTE — Telephone Encounter (Signed)
Nurse placed call to patient to review appointment details for upcoming new patient consultation visit. Patient denies any questions or concerns regarding visit. Patient advised that she can have 1 support person during visit and that mandatory masking in place for the clinic.

## 2022-04-30 ENCOUNTER — Inpatient Hospital Stay: Payer: BC Managed Care – PPO

## 2022-04-30 ENCOUNTER — Encounter: Payer: Self-pay | Admitting: Internal Medicine

## 2022-04-30 ENCOUNTER — Inpatient Hospital Stay: Payer: BC Managed Care – PPO | Attending: Internal Medicine | Admitting: Internal Medicine

## 2022-04-30 VITALS — BP 137/91 | HR 85 | Temp 97.6°F | Resp 20 | Wt 165.7 lb

## 2022-04-30 DIAGNOSIS — Z803 Family history of malignant neoplasm of breast: Secondary | ICD-10-CM | POA: Diagnosis not present

## 2022-04-30 DIAGNOSIS — D509 Iron deficiency anemia, unspecified: Secondary | ICD-10-CM | POA: Insufficient documentation

## 2022-04-30 DIAGNOSIS — Z800 Family history of malignant neoplasm of digestive organs: Secondary | ICD-10-CM | POA: Insufficient documentation

## 2022-04-30 DIAGNOSIS — Z79899 Other long term (current) drug therapy: Secondary | ICD-10-CM | POA: Insufficient documentation

## 2022-04-30 NOTE — Progress Notes (Signed)
[address]  [phone] [phone]) [phone]  ID: Arma Heading OB: February 05, 1966  MR#: [id number]  TTS#:[id number]  Patient Care Team: [name], NP as PCP - General (Internal Medicine)  REFERRING PROVIDER: Alma Friendly, NP  REASON FOR REFERRAL: IDA  HPI: Chloeanne Poteet is a 57 y.o. female with past medical history of celiac disease, depression, hypertension, GERD, headaches was referred to hematology for management of iron deficiency anemia.  Patient reports that she was seen by PCP in August 2023 when she was having depression issues.  At that time iron panel was checked which was consistent with iron deficiency.  She was started on oral iron.  Labs were repeated in 3 weeks which showed normal iron panel and patient discontinued oral iron.  Since she had a history of iron deficiency, she had labs rechecked on 04/21/2022. Ferritin of 3.  Saturation 8%.  CBC showed hemoglobin 11.4.  WBC and platelets were normal.  CMP was largely unremarkable.  Vitamin B12 324.  TSH normal.  Patient denies any bleeding in stools or urine.  Denies any melanotic stools.  Uses NSAIDs occasionally.  Reports prior history of endoscopy and colonoscopy 5 years ago when she was diagnosed with celiac disease.  Unable to find the records.   REVIEW OF SYSTEMS:   ROS  As per HPI. Otherwise, a complete review of systems is negative.  PAST MEDICAL HISTORY: Past Medical History:  Diagnosis Date   Anemia    BV (bacterial vaginosis) 11/27/2012   Celiac disease    Cough due to ACE inhibitor 04/25/2019   Depression    Essential hypertension    Family history of adverse reaction to anesthesia    MOM-HARD TIME WAKING UP   GERD (gastroesophageal reflux disease)    Migraine with visual aura    MIGRAINES   UTI (lower urinary tract infection)     PAST SURGICAL HISTORY: Past Surgical History:  Procedure Laterality Date   BLADDER SUSPENSION     EXPLORATORY LAPAROTOMY     IUD  REMOVAL  07/25/2017   Procedure: INTRAUTERINE DEVICE (IUD) REMOVAL;  Surgeon: Harlin Heys, MD;  Location: [facility] ORS;  Service: Gynecology;;   LAPAROSCOPIC ASSISTED VAGINAL HYSTERECTOMY Bilateral 07/25/2017   Procedure: LAPAROSCOPIC ASSISTED VAGINAL HYSTERECTOMY WITH BILATERAL [place] OOPHERECTOMY;  Surgeon: Harlin Heys, MD;  Location: [facility] ORS;  Service: Gynecology;  Laterality: Bilateral;   TUBAL LIGATION      FAMILY HISTORY: Family History  Problem Relation Age of Onset   Hypertension Mother    Stroke Mother    Hypertension Maternal Grandfather    Hypertension Maternal Grandmother    Colon cancer Maternal Grandmother    Breast cancer Maternal Grandmother    Skin cancer Maternal Grandmother    Hypertension Paternal Grandfather    Hypertension Paternal Grandmother    Diabetes Paternal Grandmother    Diabetes Son 31       type 1    HEALTH MAINTENANCE: Social History   Tobacco Use   Smoking status: Never   Smokeless tobacco: Never  Vaping Use   Vaping Use: Never used  Substance Use Topics   Alcohol use: No    Alcohol/week: 0.0 standard drinks of alcohol   Drug use: No     Allergies  Allergen Reactions   Topamax [Topiramate]     "cant function"    Tramadol     Cant function with it    Gluten Meal Diarrhea and Nausea Only        Ondansetron  Other (See Comments)    Helps her nausea, but makes HA worse    Current Outpatient Medications  Medication Sig Dispense Refill   cyclobenzaprine (FLEXERIL) 5 MG tablet Take one po qhs prn muscle spasm (Patient not taking: Reported on 04/21/2022) 30 tablet 0   FLUoxetine (PROZAC) 20 MG capsule Take 1 capsule (20 mg total) by mouth daily. 30 capsule 1   FLUoxetine (PROZAC) 40 MG capsule TAKE 1 CAPSULE (40 MG TOTAL) BY MOUTH DAILY. FOR ANXIETY AND DEPRESSION. 90 capsule 1   losartan (COZAAR) 50 MG tablet TAKE 1 TABLET (50 MG TOTAL) BY MOUTH DAILY. FOR BLOOD PRESSURE. 90 tablet 0   nitrofurantoin (MACRODANTIN) 100 MG  capsule Take 1 capsule (100 mg total) by mouth daily. (Patient not taking: Reported on 04/21/2022) 30 capsule 11   pantoprazole (PROTONIX) 40 MG tablet Take 1 tablet (40 mg total) by mouth daily. For heartburn. (Patient not taking: Reported on 04/21/2022) 90 tablet 1   No current facility-administered medications for this visit.    OBJECTIVE: There were no vitals filed for this visit.   There is no height or weight on file to calculate BMI.      General: Well-developed, well-nourished, no acute distress. Eyes: Pink conjunctiva, anicteric sclera. HEENT: Normocephalic, moist mucous membranes, clear oropharnyx. Lungs: Clear to auscultation bilaterally. Heart: Regular rate and rhythm. No rubs, murmurs, or gallops. Abdomen: Soft, nontender, nondistended. No organomegaly noted, normoactive bowel sounds. Musculoskeletal: No edema, cyanosis, or clubbing. Neuro: Alert, answering all questions appropriately. Cranial nerves grossly intact. Skin: No rashes or petechiae noted. Psych: Normal affect. Lymphatics: No cervical, calvicular, axillary or inguinal LAD.   LAB RESULTS:  Lab Results  Component Value Date   NA 138 02/18/2022   K 4.2 02/18/2022   CL 104 02/18/2022   CO2 28 02/18/2022   GLUCOSE 95 02/18/2022   BUN 19 02/18/2022   CREATININE 0.75 02/18/2022   CALCIUM 9.0 02/18/2022   PROT 6.6 02/18/2022   ALBUMIN 4.0 02/18/2022   AST 14 02/18/2022   ALT 12 02/18/2022   ALKPHOS 77 02/18/2022   BILITOT 0.2 02/18/2022   GFRNONAA >60 09/04/2020   GFRAA >60 04/09/2019    Lab Results  Component Value Date   WBC 4.3 04/21/2022   NEUTROABS 2.3 11/27/2021   HGB 11.4 (L) 04/21/2022   HCT 34.9 (L) 04/21/2022   MCV 85.1 04/21/2022   PLT 306.0 04/21/2022    Lab Results  Component Value Date   TIBC 403 04/21/2022   TIBC 344.4 12/28/2021   TIBC 513.8 (H) 12/04/2021   FERRITIN 3 (L) 04/21/2022   FERRITIN 46.7 12/28/2021   FERRITIN 3.3 (L) 12/04/2021   IRONPCTSAT 8 (L) 04/21/2022    IRONPCTSAT 92.0 (H) 12/28/2021   IRONPCTSAT 2.5 (L) 12/04/2021     STUDIES: No results found.  ASSESSMENT AND PLAN:   Alyiah Ulloa is a 57 y.o. female with pmh of celiac disease, depression, hypertension, GERD, headaches was referred to hematology for management of iron deficiency anemia.  # Iron deficiency anemia - Unknown etiology.  Differentials include celiac disease, microscopic GI bleed.  -Labs reviewed from 04/21/2022.  Ferritin of 3.  Saturation 8%.  CBC showed hemoglobin 11.4. -Discussed with the patient about treatment with IV Venofer weekly x 5 doses.  Occasional side effects such as nausea and back pain was discussed.  Low but potential risk of anaphylactic reaction was also discussed. -Patient is scheduled to see Dr. Vicente Males of GI soon.  Patient reports she had endoscopy and colonoscopy 5  years ago when testing showed celiac disease.  Could not find records.  Will defer to GI.  Advised to take vitamin B12 1000 mcg daily to keep B12> 500.  Orders Placed This Encounter  Procedures   Iron and TIBC   Ferritin   CBC with Differential/Platelet    Schedule for IV Venofer weekly x 5 RTC in 4 months for MD visit, labs, possible Venofer.  Patient expressed understanding and was in agreement with this plan. She also understands that She can call clinic at any time with any questions, concerns, or complaints.   I spent a total of 45 minutes reviewing chart data, face-to-face evaluation with the patient, counseling and coordination of care as detailed above.  Jane Canary, MD   04/30/2022 1:41 PM

## 2022-04-30 NOTE — Patient Instructions (Signed)
Consider taking vitamin b12 1000 mcg daily. Is available in any grocery stores or pharmacy.

## 2022-04-30 NOTE — Progress Notes (Signed)
Patient has no concerns today. 

## 2022-05-04 ENCOUNTER — Telehealth: Payer: Self-pay | Admitting: Primary Care

## 2022-05-04 NOTE — Telephone Encounter (Signed)
Patient called in stated that [name] wrote a letter for her to be out part time but her job doesn't do part time. She is needing a note for her to be off the whole time. She can be reached at [phone]. Thank you!

## 2022-05-04 NOTE — Telephone Encounter (Signed)
When is her psychiatry appointment scheduled? How about therapy?

## 2022-05-04 NOTE — Telephone Encounter (Signed)
Patient has been notified that letter is placed up front with reception for her to pickup

## 2022-05-04 NOTE — Telephone Encounter (Signed)
Called and talked to patient.  Psychiatry appointment and therapy is scheduled for the first week in February.  02/06- Psychiatry 02/07- Therapy

## 2022-05-04 NOTE — Telephone Encounter (Signed)
Noted. Letter completed and placed in [place].

## 2022-05-06 ENCOUNTER — Other Ambulatory Visit: Payer: Self-pay

## 2022-05-06 ENCOUNTER — Ambulatory Visit (INDEPENDENT_AMBULATORY_CARE_PROVIDER_SITE_OTHER): Payer: BC Managed Care – PPO | Admitting: Gastroenterology

## 2022-05-06 ENCOUNTER — Encounter: Payer: Self-pay | Admitting: Gastroenterology

## 2022-05-06 VITALS — BP 118/80 | HR 94 | Temp 97.5°F | Ht 60.0 in | Wt 170.6 lb

## 2022-05-06 DIAGNOSIS — D508 Other iron deficiency anemias: Secondary | ICD-10-CM

## 2022-05-06 MED ORDER — PEG 3350-KCL-NA BICARB-NACL 420 G PO SOLR: ORAL | 0 refills | Status: DC

## 2022-05-06 NOTE — Progress Notes (Signed)
Jonathon Bellows MD, MRCP(U.K) [address]  [place]  [place],  [address]  Main: [phone]  Fax: [phone]   Gastroenterology Consultation  Referring Provider:     [name], NP Primary Care Physician:  [name], NP Primary Gastroenterologist:  Dr. Jonathon Bellows  Reason for Consultation:     Iron deficiency anemia        HPI:   [name] is a 57 y.o. y/o female referred for consultation & management  by . [name], NP.     She has been referred to see me for iron deficiency anemia.  Seen by Dr. Doyne Keel in oncology on 04/30/2022 for iron deficiency anemia had been on oral iron ferritin of 3 saturation of 8% hemoglobin 11.4 B12 324.  She apparently has had a colonoscopy in around 2021 but no records available.  Seen by Dr. Bonna Gains in June 2022 and had dysphagia at that point of time.  Also carries a diagnosis of celiac disease no prior lab work or pathology reports available. At her last visit she was recommended to have a colonoscopy and EGD but did not occur.  She is on IV iron.  She is taking B12 supplementation.  She denies any overt blood loss may have had some black stools at some point but she is unsure.  She has been taking BC powder or Goody powders on a regular basis for many years.  Family history of colon polyps in her mother and grandmother.  No unintentional weight loss never smoked.  No change in bowel habits.  No other complaints. Past Medical History:  Diagnosis Date   Anemia    BV (bacterial vaginosis) 11/27/2012   Celiac disease    Cough due to ACE inhibitor 04/25/2019   Depression    Essential hypertension    Family history of adverse reaction to anesthesia    MOM-HARD TIME WAKING UP   GERD (gastroesophageal reflux disease)    Migraine with visual aura    MIGRAINES   UTI (lower urinary tract infection)     Past Surgical History:  Procedure Laterality Date   BLADDER SUSPENSION     EXPLORATORY LAPAROTOMY     IUD  REMOVAL  07/25/2017   Procedure: INTRAUTERINE DEVICE (IUD) REMOVAL;  Surgeon: Harlin Heys, MD;  Location: [facility] ORS;  Service: Gynecology;;   LAPAROSCOPIC ASSISTED VAGINAL HYSTERECTOMY Bilateral 07/25/2017   Procedure: LAPAROSCOPIC ASSISTED VAGINAL HYSTERECTOMY WITH BILATERAL [place] OOPHERECTOMY;  Surgeon: Harlin Heys, MD;  Location: [facility] ORS;  Service: Gynecology;  Laterality: Bilateral;   TUBAL LIGATION      Prior to Admission medications   Medication Sig Start Date End Date Taking? Authorizing Provider  cyclobenzaprine (FLEXERIL) 5 MG tablet Take one po qhs prn muscle spasm Patient not taking: Reported on 04/21/2022 02/18/22   Eugenia Pancoast, FNP  ferrous sulfate 325 (65 FE) MG EC tablet Take 325 mg by mouth 3 (three) times daily with meals.    [provider]  FLUoxetine (PROZAC) 20 MG capsule Take 1 capsule (20 mg total) by mouth daily. 02/04/22 04/30/22  Norman Clay, MD  FLUoxetine (PROZAC) 40 MG capsule TAKE 1 CAPSULE (40 MG TOTAL) BY MOUTH DAILY. FOR ANXIETY AND DEPRESSION. 03/27/22   [name], NP  losartan (COZAAR) 50 MG tablet TAKE 1 TABLET (50 MG TOTAL) BY MOUTH DAILY. FOR BLOOD PRESSURE. 02/22/22   Eugenia Pancoast, FNP  nitrofurantoin (MACRODANTIN) 100 MG capsule Take 1 capsule (100 mg total) by mouth daily.  Patient not taking: Reported on 04/21/2022 02/15/22   Bjorn Loser, MD  pantoprazole (PROTONIX) 40 MG tablet Take 1 tablet (40 mg total) by mouth daily. For heartburn. Patient not taking: Reported on 04/21/2022 03/24/22   [name], NP  atorvastatin (LIPITOR) 20 MG tablet Take 1 tablet (20 mg total) by mouth every evening. For cholesterol. 03/02/19 07/02/20  [name], NP  traZODone (DESYREL) 50 MG tablet  08/15/19 07/02/20  [provider]    Family History  Problem Relation Age of Onset   Hypertension Mother    Stroke Mother    Cancer Father    Hypertension Maternal Grandmother    Colon cancer Maternal Grandmother     Breast cancer Maternal Grandmother    Skin cancer Maternal Grandmother    Hypertension Maternal Grandfather    Hypertension Paternal Grandmother    Diabetes Paternal Grandmother    Hypertension Paternal Grandfather    Diabetes Son 4       type 1     Social History   Tobacco Use   Smoking status: Never   Smokeless tobacco: Never  Vaping Use   Vaping Use: Never used  Substance Use Topics   Alcohol use: No    Alcohol/week: 0.0 standard drinks of alcohol   Drug use: No    Allergies as of 05/06/2022 - Review Complete 05/06/2022  Allergen Reaction Noted   Topamax [topiramate]  03/19/2019   Tramadol  03/19/2019   Gluten meal Diarrhea and Nausea Only 11/27/2012   Ondansetron Other (See Comments) 04/28/2012    Review of Systems:    All systems reviewed and negative except where noted in HPI.   Physical Exam:  BP 118/80   Pulse 94   Temp (!) 97.5 F (36.4 C) (Oral)   Ht 5' (1.524 m)   Wt 170 lb 9.6 oz (77.4 kg)   LMP  (LMP Unknown)   BMI 33.32 kg/m  No LMP recorded (lmp unknown). Patient has had a hysterectomy. Psych:  Alert and cooperative. Normal mood and affect. General:   Alert,  Well-developed, well-nourished, pleasant and cooperative in NAD Head:  Normocephalic and atraumatic. Eyes:  Sclera clear, no icterus.   Conjunctiva pink. Ears:  Normal auditory acuity. Neurologic:  Alert and oriented x3;  grossly normal neurologically. Psych:  Alert and cooperative. Normal mood and affect.  Imaging Studies: No results found.  Assessment and Plan:   [name] is a 57 y.o. y/o female has been referred for iron deficiency anemia.  Carries a diagnosis of celiac disease but no evidence available on epic to suggest that she has the same no prior EGD or colonoscopy records available.  Back in 2022 when she saw Dr. Bonna Gains had a history of dysphagia but EGD and colonoscopy also recommended was not scheduled by the patient.  Plan 1.  Celiac serology, urine analysis, H.  pylori breath test.  Advised to stop all NSAID use this may be the cause of her iron deficiency anemia 2.  EGD and colonoscopy if negative capsule study of small bowel at the time of EGD biopsies of small bowel will also be taken to evaluate for celiac disease. 3.  Continue to follow-up with hematology for IV iron.  B12 supplementation as well  I have discussed alternative options, risks & benefits,  which include, but are not limited to, bleeding, infection, perforation,respiratory complication & drug reaction.  The patient agrees with this plan & written consent will be obtained.    Follow up in 12  weeks  Dr Jonathon Bellows MD,MRCP(U.K)

## 2022-05-07 MED FILL — Iron Sucrose Inj 20 MG/ML (Fe Equiv): INTRAVENOUS | Qty: 10 | Status: AC

## 2022-05-09 LAB — IRON,TIBC AND FERRITIN PANEL
Ferritin: 25 ng/mL (ref 15–150)
Iron Saturation: 52 % (ref 15–55)
Iron: 187 ug/dL — ABNORMAL HIGH (ref 27–159)
Total Iron Binding Capacity: 361 ug/dL (ref 250–450)
UIBC: 174 ug/dL (ref 131–425)

## 2022-05-09 LAB — URINALYSIS
Bilirubin, UA: NEGATIVE
Glucose, UA: NEGATIVE
Ketones, UA: NEGATIVE
Nitrite, UA: NEGATIVE
Protein,UA: NEGATIVE
RBC, UA: NEGATIVE
Specific Gravity, UA: 1.017 (ref 1.005–1.030)
Urobilinogen, Ur: 0.2 mg/dL (ref 0.2–1.0)
pH, UA: 7 (ref 5.0–7.5)

## 2022-05-09 LAB — H. PYLORI BREATH TEST: H pylori Breath Test: NEGATIVE

## 2022-05-09 LAB — B12 AND FOLATE PANEL
Folate: 16.5 ng/mL (ref >3.0)
Vitamin B-12: 766 pg/mL (ref 232–1245)

## 2022-05-09 LAB — CELIAC DISEASE AB SCREEN W/RFX
Antigliadin Abs, IgA: 6 units (ref 0–19)
IgA/Immunoglobulin A, Serum: 202 mg/dL (ref 87–352)
Transglutaminase IgA: <2 U/mL (ref 0–3)

## 2022-05-10 ENCOUNTER — Inpatient Hospital Stay: Payer: BC Managed Care – PPO

## 2022-05-10 VITALS — BP 140/83 | HR 79 | Temp 98.7°F | Resp 18

## 2022-05-10 DIAGNOSIS — Z79899 Other long term (current) drug therapy: Secondary | ICD-10-CM | POA: Diagnosis not present

## 2022-05-10 DIAGNOSIS — D509 Iron deficiency anemia, unspecified: Secondary | ICD-10-CM

## 2022-05-10 DIAGNOSIS — Z803 Family history of malignant neoplasm of breast: Secondary | ICD-10-CM | POA: Diagnosis not present

## 2022-05-10 DIAGNOSIS — Z800 Family history of malignant neoplasm of digestive organs: Secondary | ICD-10-CM | POA: Diagnosis not present

## 2022-05-10 MED ORDER — SODIUM CHLORIDE 0.9 % IV SOLN
200.0000 mg | Freq: Once | INTRAVENOUS | Status: AC
  Administered 2022-05-10: 200 mg via INTRAVENOUS
  Filled 2022-05-10: qty 200

## 2022-05-10 MED ORDER — SODIUM CHLORIDE 0.9 % IV SOLN
Freq: Once | INTRAVENOUS | Status: AC
  Filled 2022-05-10: qty 250

## 2022-05-11 ENCOUNTER — Encounter
Admission: RE | Disposition: A | Discharge status: Home / Self Care | Payer: Self-pay | Source: Home / Self Care | Attending: Gastroenterology

## 2022-05-11 ENCOUNTER — Encounter: Payer: Self-pay | Admitting: Gastroenterology

## 2022-05-11 ENCOUNTER — Ambulatory Visit: Payer: BC Managed Care – PPO | Admitting: Certified Registered"

## 2022-05-11 ENCOUNTER — Ambulatory Visit
Admission: RE | Admit: 2022-05-11 | Discharge: 2022-05-11 | Disposition: A | Discharge status: Home / Self Care | Payer: BC Managed Care – PPO | Attending: Gastroenterology | Admitting: Gastroenterology

## 2022-05-11 DIAGNOSIS — D509 Iron deficiency anemia, unspecified: Secondary | ICD-10-CM | POA: Insufficient documentation

## 2022-05-11 DIAGNOSIS — K5730 Diverticulosis of large intestine without perforation or abscess without bleeding: Secondary | ICD-10-CM | POA: Diagnosis not present

## 2022-05-11 DIAGNOSIS — D508 Other iron deficiency anemias: Secondary | ICD-10-CM

## 2022-05-11 DIAGNOSIS — K900 Celiac disease: Secondary | ICD-10-CM | POA: Diagnosis not present

## 2022-05-11 DIAGNOSIS — K449 Diaphragmatic hernia without obstruction or gangrene: Secondary | ICD-10-CM | POA: Insufficient documentation

## 2022-05-11 DIAGNOSIS — I10 Essential (primary) hypertension: Secondary | ICD-10-CM | POA: Insufficient documentation

## 2022-05-11 DIAGNOSIS — K2100 Gastro-esophageal reflux disease with esophagitis, without bleeding: Secondary | ICD-10-CM | POA: Insufficient documentation

## 2022-05-11 DIAGNOSIS — K209 Esophagitis, unspecified without bleeding: Secondary | ICD-10-CM | POA: Diagnosis not present

## 2022-05-11 HISTORY — PX: ESOPHAGOGASTRODUODENOSCOPY (EGD) WITH PROPOFOL: SHX5813

## 2022-05-11 HISTORY — PX: COLONOSCOPY WITH PROPOFOL: SHX5780

## 2022-05-11 SURGERY — COLONOSCOPY WITH PROPOFOL
Anesthesia: General

## 2022-05-11 MED ORDER — PROPOFOL 10 MG/ML IV BOLUS
INTRAVENOUS | Status: DC | PRN
  Administered 2022-05-11: 120 mg via INTRAVENOUS
  Administered 2022-05-11: 150 ug/kg/min via INTRAVENOUS

## 2022-05-11 MED ORDER — STERILE WATER FOR IRRIGATION IR SOLN
Status: DC | PRN
  Administered 2022-05-11: 60 mL

## 2022-05-11 MED ORDER — LIDOCAINE HCL (PF) 2 % IJ SOLN
INTRAMUSCULAR | Status: AC
  Filled 2022-05-11: qty 5

## 2022-05-11 MED ORDER — LIDOCAINE HCL (CARDIAC) PF 100 MG/5ML IV SOSY
PREFILLED_SYRINGE | INTRAVENOUS | Status: DC | PRN
  Administered 2022-05-11: 100 mg via INTRAVENOUS

## 2022-05-11 MED ORDER — PROPOFOL 10 MG/ML IV BOLUS
INTRAVENOUS | Status: AC
  Filled 2022-05-11: qty 40

## 2022-05-11 MED ORDER — SODIUM CHLORIDE 0.9 % IV SOLN
INTRAVENOUS | Status: DC
  Administered 2022-05-11: 1000 mL via INTRAVENOUS

## 2022-05-11 NOTE — Op Note (Signed)
[facility] Gastroenterology Patient Name: [name] Procedure Date: 05/11/2022 10:40 AM MRN: [id number] Account #: [id number] Date of Birth: [birth year] Admit Type: Outpatient Age: 57 Room: 3 Gender: Female Note Status: Finalized Instrument Name: Upper Endoscope [id number] Procedure:             Upper GI endoscopy Indications:           Iron deficiency anemia Providers:             Jonathon Bellows MD, MD Medicines:             Monitored Anesthesia Care Complications:         No immediate complications. Procedure:             Pre-Anesthesia Assessment:                        - Prior to the procedure, a History and Physical was                         performed, and patient medications, allergies and                         sensitivities were reviewed. The patient's tolerance                         of previous anesthesia was reviewed.                        - The risks and benefits of the procedure and the                         sedation options and risks were discussed with the                         patient. All questions were answered and informed                         consent was obtained.                        - ASA Grade Assessment: II - A patient with mild                         systemic disease.                        After obtaining informed consent, the endoscope was                         passed under direct vision. Throughout the procedure,                         the patient's blood pressure, pulse, and oxygen                         saturations were monitored continuously. The Endoscope                         was introduced through the mouth, and advanced to the  third part of duodenum. The upper GI endoscopy was                         accomplished with ease. The patient tolerated the                         procedure well. Findings:      The examined duodenum was normal. Biopsies for histology were taken with       a  cold forceps for evaluation of celiac disease.      A large hiatal hernia was present.      The cardia and gastric fundus were normal on retroflexion.      LA Grade C (one or more mucosal breaks continuous between tops of 2 or       more mucosal folds, less than 75% circumference) esophagitis with no       bleeding was found in the lower third of the esophagus. Impression:            - Normal examined duodenum. Biopsied.                        - Large hiatal hernia.                        - LA Grade C reflux esophagitis with no bleeding. Recommendation:        - Use Prilosec (omeprazole) 40 mg PO BID for 3 months.                        - Repeat upper endoscopy in 2 months to evaluate the                         response to therapy. Procedure Code(s):     --- Professional ---                        [phone], Esophagogastroduodenoscopy, flexible,                         transoral; with biopsy, single or multiple Diagnosis Code(s):     --- Professional ---                        K44.9, Diaphragmatic hernia without obstruction or                         gangrene                        K21.00, Gastro-esophageal reflux disease with                         esophagitis, without bleeding                        D50.9, Iron deficiency anemia, unspecified CPT copyright 2022 American Medical Association. All rights reserved. The codes documented in this report are preliminary and upon coder review may  be revised to meet current compliance requirements. Jonathon Bellows, MD Jonathon Bellows MD, MD 05/11/2022 10:49:35 AM This report has been signed electronically. Number of Addenda: 0 Note Initiated On: 05/11/2022 10:40 AM Estimated Blood Loss:  Estimated blood loss: none.      [facility]

## 2022-05-11 NOTE — Anesthesia Postprocedure Evaluation (Signed)
Anesthesia Post Note  Patient: [name]  Procedure(s) Performed: COLONOSCOPY WITH PROPOFOL ESOPHAGOGASTRODUODENOSCOPY (EGD) WITH PROPOFOL  Patient location during evaluation: Endoscopy Anesthesia Type: General Level of consciousness: awake and alert Pain management: pain level controlled Vital Signs Assessment: post-procedure vital signs reviewed and stable Respiratory status: spontaneous breathing, nonlabored ventilation, respiratory function stable and patient connected to nasal cannula oxygen Cardiovascular status: blood pressure returned to baseline and stable Postop Assessment: no apparent nausea or vomiting Anesthetic complications: no   No notable events documented.   Last Vitals:  Vitals:   05/11/22 1105 05/11/22 1115  BP: 123/82 120/70  Pulse:    Resp:    Temp: (!) 36.3 C   SpO2:      Last Pain:  Vitals:   05/11/22 1115  TempSrc:   PainSc: 0-No pain                 Arita Miss

## 2022-05-11 NOTE — Anesthesia Preprocedure Evaluation (Addendum)
Anesthesia Evaluation  Patient identified by MRN, date of birth, ID band Patient awake    Reviewed: Allergy & Precautions, NPO status , Patient's Chart, lab work & pertinent test results  History of Anesthesia Complications Negative for: history of anesthetic complications  Airway Mallampati: II  TM Distance: >3 FB Neck ROM: Full    Dental  (+) Poor Dentition, Caps, Chipped,    Pulmonary neg pulmonary ROS, neg sleep apnea, neg COPD, Patient abstained from smoking.Not current smoker   Pulmonary exam normal breath sounds clear to auscultation       Cardiovascular Exercise Tolerance: Good METShypertension, (-) CAD and (-) Past MI (-) dysrhythmias  Rhythm:Regular Rate:Normal - Systolic murmurs    Neuro/Psych  Headaches PSYCHIATRIC DISORDERS  Depression       GI/Hepatic ,GERD  ,,(+)     (-) substance abuse    Endo/Other  neg diabetes    Renal/GU negative Renal ROS     Musculoskeletal   Abdominal   Peds  Hematology   Anesthesia Other Findings Past Medical History: No date: Anemia 11/27/2012: BV (bacterial vaginosis) No date: Celiac disease 04/25/2019: Cough due to ACE inhibitor No date: Depression No date: Essential hypertension No date: Family history of adverse reaction to anesthesia     Comment:  MOM-HARD TIME WAKING UP No date: GERD (gastroesophageal reflux disease) No date: Migraine with visual aura     Comment:  MIGRAINES No date: UTI (lower urinary tract infection)  Reproductive/Obstetrics                             Anesthesia Physical Anesthesia Plan  ASA: 2  Anesthesia Plan: General   Post-op Pain Management: Minimal or no pain anticipated   Induction: Intravenous  PONV Risk Score and Plan: 3 and Propofol infusion, TIVA and Ondansetron  Airway Management Planned: Nasal Cannula  Additional Equipment: None  Intra-op Plan:   Post-operative Plan:   Informed Consent:  I have reviewed the patients History and Physical, chart, labs and discussed the procedure including the risks, benefits and alternatives for the proposed anesthesia with the patient or authorized representative who has indicated his/her understanding and acceptance.     Dental advisory given  Plan Discussed with: CRNA and Surgeon  Anesthesia Plan Comments: (Discussed risks of anesthesia with patient, including possibility of difficulty with spontaneous ventilation under anesthesia necessitating airway intervention, PONV, and rare risks such as cardiac or respiratory or neurological events, and allergic reactions. Discussed the role of CRNA in patient's perioperative care. Patient understands.)       Anesthesia Quick Evaluation

## 2022-05-11 NOTE — H&P (Signed)
Jonathon Bellows, MD [address], [place], [place], [place], [address] [address] [place], [place], [place], [address] Phone: [phone]  Fax: [phone]  Primary Care Physician:  [name], NP   Pre-Procedure History & Physical: HPI:  [name] is a 57 y.o. female is here for an endoscopy and colonoscopy    Past Medical History:  Diagnosis Date   Anemia    BV (bacterial vaginosis) 11/27/2012   Celiac disease    Cough due to ACE inhibitor 04/25/2019   Depression    Essential hypertension    Family history of adverse reaction to anesthesia    MOM-HARD TIME WAKING UP   GERD (gastroesophageal reflux disease)    Migraine with visual aura    MIGRAINES   UTI (lower urinary tract infection)     Past Surgical History:  Procedure Laterality Date   ABDOMINAL HYSTERECTOMY     BLADDER SUSPENSION     EXPLORATORY LAPAROTOMY     IUD REMOVAL  07/25/2017   Procedure: INTRAUTERINE DEVICE (IUD) REMOVAL;  Surgeon: Harlin Heys, MD;  Location: [facility] ORS;  Service: Gynecology;;   LAPAROSCOPIC ASSISTED VAGINAL HYSTERECTOMY Bilateral 07/25/2017   Procedure: LAPAROSCOPIC ASSISTED VAGINAL HYSTERECTOMY WITH BILATERAL [place];  Surgeon: Harlin Heys, MD;  Location: [facility] ORS;  Service: Gynecology;  Laterality: Bilateral;   TUBAL LIGATION      Prior to Admission medications   Medication Sig Start Date End Date Taking? Authorizing Provider  ferrous sulfate 325 (65 FE) MG EC tablet Take 325 mg by mouth 3 (three) times daily with meals.   Yes [provider]  FLUoxetine (PROZAC) 40 MG capsule TAKE 1 CAPSULE (40 MG TOTAL) BY MOUTH DAILY. FOR ANXIETY AND DEPRESSION. 03/27/22  Yes [name], NP  losartan (COZAAR) 50 MG tablet TAKE 1 TABLET (50 MG TOTAL) BY MOUTH DAILY. FOR BLOOD PRESSURE. 02/22/22  Yes Dugal, Tabitha, FNP  nitrofurantoin (MACRODANTIN) 100 MG capsule Take 1 capsule (100 mg total) by mouth daily. 02/15/22  Yes MacDiarmid, Nicki Reaper,  MD  pantoprazole (PROTONIX) 40 MG tablet Take 1 tablet (40 mg total) by mouth daily. For heartburn. 03/24/22  Yes [name], NP  polyethylene glycol-electrolytes (NULYTELY) 420 g solution Prepare according to package instructions. Starting at 5:00 PM: Drink one 8 oz glass of mixture every 15 minutes until you finish half of the jug. Five hours prior to procedure, drink 8 oz glass of mixture every 15 minutes until it is all gone. Make sure you do not drink anything 4 hours prior to your procedure. 05/06/22  Yes Jonathon Bellows, MD  cyclobenzaprine (FLEXERIL) 5 MG tablet Take one po qhs prn muscle spasm Patient not taking: Reported on 05/11/2022 02/18/22   Eugenia Pancoast, FNP  FLUoxetine (PROZAC) 20 MG capsule Take 1 capsule (20 mg total) by mouth daily. 02/04/22 05/06/22  Norman Clay, MD  atorvastatin (LIPITOR) 20 MG tablet Take 1 tablet (20 mg total) by mouth every evening. For cholesterol. 03/02/19 07/02/20  [name], NP  traZODone (DESYREL) 50 MG tablet  08/15/19 07/02/20  [provider]    Allergies as of 05/06/2022 - Review Complete 05/06/2022  Allergen Reaction Noted   Topamax [topiramate]  03/19/2019   Tramadol  03/19/2019   Gluten meal Diarrhea and Nausea Only 11/27/2012   Ondansetron Other (See Comments) 04/28/2012    Family History  Problem Relation Age of Onset   Hypertension Mother    Stroke Mother    Cancer Father  Hypertension Maternal Grandmother    Colon cancer Maternal Grandmother    Breast cancer Maternal Grandmother    Skin cancer Maternal Grandmother    Hypertension Maternal Grandfather    Hypertension Paternal Grandmother    Diabetes Paternal Grandmother    Hypertension Paternal Grandfather    Diabetes Son 27       type 1    Social History   Socioeconomic History   Marital status: Married    Spouse name: [name]   Number of children: 3   Years of education: 12   Highest education level: High school graduate  Occupational History    Occupation: Personnel officer  Tobacco Use   Smoking status: Never   Smokeless tobacco: Never  Scientific laboratory technician Use: Never used  Substance and Sexual Activity   Alcohol use: No    Alcohol/week: 0.0 standard drinks of alcohol   Drug use: No   Sexual activity: Yes    Partners: Female    Birth control/protection: Surgical    Comment: INTERCOURSE AGE 82, SEXUAL PARTNERS LEES THAN 6  Other Topics Concern   Not on file  Social History Narrative   Lives with her husband and their three children, and her daughter's boyfriend.  Her older son's daughter lives there part-time as well.   Social Determinants of Health   Financial Resource Strain: Not on file  Food Insecurity: Not on file  Transportation Needs: Not on file  Physical Activity: Not on file  Stress: Not on file  Social Connections: Not on file  Intimate Partner Violence: Not on file    Review of Systems: See HPI, otherwise negative ROS  Physical Exam: BP (!) 140/94   Pulse 84   Temp (!) 96.2 F (35.7 C) (Temporal)   Resp 18   Ht 5' (1.524 m)   Wt 75.9 kg   LMP  (LMP Unknown)   SpO2 99%   BMI 32.68 kg/m  General:   Alert,  pleasant and cooperative in NAD Head:  Normocephalic and atraumatic. Neck:  Supple; no masses or thyromegaly. Lungs:  Clear throughout to auscultation, normal respiratory effort.    Heart:  +S1, +S2, Regular rate and rhythm, No edema. Abdomen:  Soft, nontender and nondistended. Normal bowel sounds, without guarding, and without rebound.   Neurologic:  Alert and  oriented x4;  grossly normal neurologically.  Impression/Plan: [name] is here for an endoscopy and colonoscopy  to be performed for  evaluation of iron deficiency anemia    Risks, benefits, limitations, and alternatives regarding endoscopy have been reviewed with the patient.  Questions have been answered.  All parties agreeable.   Jonathon Bellows, MD  05/11/2022, 10:38 AM

## 2022-05-11 NOTE — Transfer of Care (Signed)
Immediate Anesthesia Transfer of Care Note  Patient: [name]  Procedure(s) Performed: COLONOSCOPY WITH PROPOFOL ESOPHAGOGASTRODUODENOSCOPY (EGD) WITH PROPOFOL  Patient Location: PACU  Anesthesia Type:General  Level of Consciousness: drowsy  Airway & Oxygen Therapy: Patient Spontanous Breathing  Post-op Assessment: Report given to RN and Post -op Vital signs reviewed and stable  Post vital signs: Reviewed and stable  Last Vitals:  Vitals Value Taken Time  BP 123/82 05/11/22 1105  Temp 36.3 C 05/11/22 1105  Pulse 85 05/11/22 1105  Resp 17 05/11/22 1105  SpO2 94 % 05/11/22 1105  Vitals shown include unvalidated device data.  Last Pain:  Vitals:   05/11/22 1105  TempSrc: Temporal  PainSc: Asleep         Complications: No notable events documented.

## 2022-05-11 NOTE — Op Note (Signed)
[facility] Gastroenterology Patient Name: [name] Procedure Date: 05/11/2022 10:40 AM MRN: [id number] Account #: [id number] Date of Birth: 07-30-1965 Admit Type: Outpatient Age: 57 Room: [facility] ENDO ROOM 2 Gender: Female Note Status: Finalized Instrument Name: Jasper Riling [id number] Procedure:             Colonoscopy Indications:           Iron deficiency anemia Providers:             Jonathon Bellows MD, MD Referring MD:          [name] (Referring MD) Medicines:             Monitored Anesthesia Care Complications:         No immediate complications. Procedure:             Pre-Anesthesia Assessment:                        - Prior to the procedure, a History and Physical was                         performed, and patient medications, allergies and                         sensitivities were reviewed. The patient's tolerance                         of previous anesthesia was reviewed.                        - The risks and benefits of the procedure and the                         sedation options and risks were discussed with the                         patient. All questions were answered and informed                         consent was obtained.                        - ASA Grade Assessment: II - A patient with mild                         systemic disease.                        After obtaining informed consent, the colonoscope was                         passed under direct vision. Throughout the procedure,                         the patient's blood pressure, pulse, and oxygen                         saturations were monitored continuously. The                         Colonoscope was introduced through the  anus and                         advanced to the the cecum, identified by the                         appendiceal orifice. The colonoscopy was performed                         with ease. The patient tolerated the procedure well.                          The quality of the bowel preparation was excellent.                         The ileocecal valve, appendiceal orifice, and rectum                         were photographed. Findings:      The perianal and digital rectal examinations were normal.      Multiple medium-mouthed diverticula were found in the entire colon.      The exam was otherwise without abnormality on direct and retroflexion       views. Impression:            - Diverticulosis in the entire examined colon.                        - The examination was otherwise normal on direct and                         retroflexion views.                        - No specimens collected. Recommendation:        - Discharge patient to home (with escort).                        - Resume previous diet.                        - Continue present medications.                        - Repeat colonoscopy in 10 years for screening                         purposes.                        - Return to GI office as previously scheduled. Procedure Code(s):     --- Professional ---                        [phone], Colonoscopy, flexible; diagnostic, including                         collection of specimen(s) by brushing or washing, when                         performed (separate procedure) Diagnosis Code(s):     --- Professional ---  D50.9, Iron deficiency anemia, unspecified                        K57.30, Diverticulosis of large intestine without                         perforation or abscess without bleeding CPT copyright 2022 American Medical Association. All rights reserved. The codes documented in this report are preliminary and upon coder review may  be revised to meet current compliance requirements. Jonathon Bellows, MD Jonathon Bellows MD, MD 05/11/2022 11:04:24 AM This report has been signed electronically. Number of Addenda: 0 Note Initiated On: 05/11/2022 10:40 AM Scope Withdrawal Time: 0 hours 7 minutes 10 seconds  Total  Procedure Duration: 0 hours 11 minutes 51 seconds  Estimated Blood Loss:  Estimated blood loss: none.      [facility]

## 2022-05-12 ENCOUNTER — Encounter: Payer: Self-pay | Admitting: Gastroenterology

## 2022-05-12 LAB — SURGICAL PATHOLOGY

## 2022-05-13 ENCOUNTER — Encounter: Payer: Self-pay | Admitting: Gastroenterology

## 2022-05-15 NOTE — Progress Notes (Unsigned)
[place] MD/PA/NP OP Progress Note  05/18/2022 1:30 PM [name]  MRN:  [id number]  Chief Complaint:  Chief Complaint  Patient presents with   Follow-up   HPI:  - she was seen by GI for IDA. Checked celiac serology, and planned for EGD This is a follow-up appointment for depression, OCD.  She states that she is now working part-time.  She was overwhelmed when she was working full-time.  Although she was doing better with higher dose of fluoxetine, it got worse when she was sued by her niece.  She states that her niece is on drug.  It was settled on January 18.  It took a toll on her as she felt betrayed, although she is a family member.  It started all of her sudden.  She cannot understand how can family do this.  She thinks it sent her backwards. The patient has mood symptoms as in PHQ-9/GAD-7.  She has middle insomnia.  She snores at night.  Although she reports passive SI, she denies any intent or plan.  She reports less thoughts about her husband.  She thinks the medication is working, and he is also working on himself.  She has significant struggle with concentration.  She thinks she cannot do anything right, and she forgets what she is saying.  Although she zone out at times, she does not think it is due to higher dose of fluoxetine, and is willing to try higher dose.    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 She reports good support from her parents  Visit Diagnosis:    ICD-10-CM   1. Obsessive-compulsive disorder, unspecified type  F42.9     2. Severe episode of recurrent major depressive disorder, without psychotic features ([place])  F33.2     3. Insomnia, unspecified type   G47.00 Ambulatory referral to Pulmonology      Past Psychiatric History:  Outpatient:  Psychiatry admission:  Previous suicide attempt:  Past trials of medication:  History of violence:  History of head injury:   Past Medical History:  Past Medical History:  Diagnosis Date   Anemia    BV (bacterial vaginosis) 11/27/2012   Celiac disease    Cough due to ACE inhibitor 04/25/2019   Depression    Essential hypertension    Family history of adverse reaction to anesthesia    MOM-HARD TIME WAKING UP   GERD (gastroesophageal reflux disease)    Migraine with visual aura    MIGRAINES   UTI (lower urinary tract infection)     Past Surgical History:  Procedure Laterality Date   ABDOMINAL HYSTERECTOMY     BLADDER SUSPENSION     COLONOSCOPY WITH PROPOFOL N/A 05/11/2022   Procedure: COLONOSCOPY WITH PROPOFOL;  Surgeon: Jonathon Bellows, MD;  [facility] END[facility]COPY;  Service: Gastroenterology;  Laterality: N/A;   ESOPHAGOGASTRODUODENOSCOPY (EGD) WITH PROPOFOL N/A 05/11/2022   Procedure: ESOPHAGOGASTRODUODENOSCOPY (EGD) WITH PROPOFOL;  Surgeon: Jonathon Bellows, MD;  Location: [facility] ENDOSCOPY;  Service: Gastroenterology;  Laterality: N/A;   EXPLORATORY LAPAROTOMY     IUD REMOVAL  07/25/2017   Procedure: INTRAUTERINE DEVICE (IUD) REMOVAL;  Surgeon: Harlin Heys, MD;  Location: [facility] ORS;  Service: Gynecology;;   LAPAROSCOPIC ASSISTED VAGINAL  HYSTERECTOMY Bilateral 07/25/2017   Procedure: LAPAROSCOPIC ASSISTED VAGINAL HYSTERECTOMY WITH BILATERAL [place] OOPHERECTOMY;  Surgeon: Harlin Heys, MD;  Location: [facility] ORS;  Service: Gynecology;  Laterality: Bilateral;   TUBAL LIGATION      Family Psychiatric History: Please see initial evaluation for full details. I have reviewed the history. No updates at this time.     Family History:  Family History  Problem Relation Age of Onset   Hypertension Mother    Stroke Mother    Cancer Father    Hypertension Maternal Grandmother    Colon cancer  Maternal Grandmother    Breast cancer Maternal Grandmother    Skin cancer Maternal Grandmother    Hypertension Maternal Grandfather    Hypertension Paternal Grandmother    Diabetes Paternal Grandmother    Hypertension Paternal Grandfather    Diabetes Son 21       type 1    Social History:  Social History   Socioeconomic History   Marital status: Married    Spouse name: [name]   Number of children: 3   Years of education: 12   Highest education level: High school graduate  Occupational History   Occupation: Personnel officer  Tobacco Use   Smoking status: Never   Smokeless tobacco: Never  Scientific laboratory technician Use: Never used  Substance and Sexual Activity   Alcohol use: No    Alcohol/week: 0.0 standard drinks of alcohol   Drug use: No   Sexual activity: Yes    Partners: Female    Birth control/protection: Surgical    Comment: INTERCOURSE AGE 57, SEXUAL PARTNERS LEES THAN 6  Other Topics Concern   Not on file  Social History Narrative   Lives with her husband and their three children, and her daughter's boyfriend.  Her older son's daughter lives there part-time as well.   Social Determinants of Health   Financial Resource Strain: Not on file  Food Insecurity: Not on file  Transportation Needs: Not on file  Physical Activity: Not on file  Stress: Not on file  Social Connections: Not on file    Allergies:  Allergies  Allergen Reactions   Topamax [Topiramate]     "cant function"    Tramadol     Cant function with it    Gluten Meal Diarrhea and Nausea Only        Ondansetron Other (See Comments)    Helps her nausea, but makes HA worse    Metabolic Disorder Labs: Lab Results  Component Value Date   HGBA1C 5.9 09/09/2020   MPG 108.28 02/24/2019   Lab Results  Component Value Date   PROLACTIN 7.6 06/19/2014   Lab Results  Component Value Date   CHOL 201 (H) 09/09/2020   TRIG 138.0 09/09/2020   HDL 39.50 09/09/2020   CHOLHDL 5 09/09/2020    VLDL 27.6 09/09/2020   LDLCALC 134 (H) 09/09/2020   LDLCALC 65 08/29/2019   Lab Results  Component Value Date   TSH 3.01 02/18/2022   TSH 1.79 11/27/2021    Therapeutic Level Labs: No results found for: "LITHIUM" No results found for: "VALPROATE" No results found for: "CBMZ"  Current Medications: Current Outpatient Medications  Medication Sig Dispense Refill   cyclobenzaprine (FLEXERIL) 5 MG tablet Take one po qhs prn muscle spasm 30 tablet 0   ferrous sulfate 325 (65 FE) MG EC tablet Take 325 mg by mouth 3 (three) times daily with meals.     FLUoxetine (PROZAC) 40 MG capsule  TAKE 1 CAPSULE (40 MG TOTAL) BY MOUTH DAILY. FOR ANXIETY AND DEPRESSION. 90 capsule 1   FLUoxetine (PROZAC) 40 MG capsule Take 2 capsules (80 mg total) by mouth daily. 180 capsule 0   losartan (COZAAR) 50 MG tablet TAKE 1 TABLET (50 MG TOTAL) BY MOUTH DAILY. FOR BLOOD PRESSURE. 90 tablet 0   nitrofurantoin (MACRODANTIN) 100 MG capsule Take 1 capsule (100 mg total) by mouth daily. 30 capsule 11   pantoprazole (PROTONIX) 40 MG tablet Take 1 tablet (40 mg total) by mouth daily. For heartburn. 90 tablet 1   polyethylene glycol-electrolytes (NULYTELY) 420 g solution Prepare according to package instructions. Starting at 5:00 PM: Drink one 8 oz glass of mixture every 15 minutes until you finish half of the jug. Five hours prior to procedure, drink 8 oz glass of mixture every 15 minutes until it is all gone. Make sure you do not drink anything 4 hours prior to your procedure. 4000 mL 0   No current facility-administered medications for this visit.     Musculoskeletal: Strength & Muscle Tone: within normal limits Gait & Station: normal Patient leans: N/A  Psychiatric Specialty Exam: Review of Systems  Psychiatric/Behavioral:  Positive for decreased concentration, dysphoric mood, sleep disturbance and suicidal ideas. Negative for agitation, behavioral problems, confusion, hallucinations and self-injury. The  patient is nervous/anxious. The patient is not hyperactive.   All other systems reviewed and are negative.   Blood pressure 129/82, pulse (!) 103, temperature 98.5 F (36.9 C), temperature source Skin, height 5' (1.524 m), weight 170 lb 9.6 oz (77.4 kg).Body mass index is 33.32 kg/m.  General Appearance: Fairly Groomed  Eye Contact:  Good  Speech:  Clear and Coherent  Volume:  Normal  Mood:   better  Affect:  Appropriate, Congruent, and calm  Thought Process:  Coherent  Orientation:  Full (Time, Place, and Person)  Thought Content: Logical   Suicidal Thoughts:  Yes.  without intent/plan  Homicidal Thoughts:  No  Memory:  Immediate;   Good  Judgement:  Good  Insight:  Good  Psychomotor Activity:  Normal  Concentration:  Concentration: Good and Attention Span: Good  Recall:  Good  Fund of Knowledge: Good  Language: Good  Akathisia:  No  Handed:  Right  AIMS (if indicated): not done  Assets:  Communication Skills Desire for Improvement  ADL's:  Intact  Cognition: WNL  Sleep:  Poor   Screenings: GAD-7    Physiological scientist Office Visit from 05/18/2022 in [place] Office Visit from 02/25/2022 in [place] at [place] Office Visit from 02/04/2022 in [place]  Total GAD-7 Score '15 10 20      '$ PHQ2-9    [place] Office Visit from 05/18/2022 in [place] Office Visit from 02/25/2022 in [place] at [place] Office Visit from 02/04/2022 in [place] Office Visit from 12/28/2021 in [place] at [place] Visit from 09/09/2020 in [place] at [place]  PHQ-2 Total Score '4 4 5 2 6  '$ PHQ-9 Total Score '21 15 21 7 18      '$ [place] Office Visit from 05/18/2022 in [place] Admission (Discharged) from 05/11/2022 in [place] Office Visit from 02/04/2022 in [place] Error: Q3, 4, or 5 should not  be populated when Q2 is No No Risk Low Risk        Assessment and Plan:  Caterin Tabares is a 57 y.o. year old female with a history of depression, hypertension, hyperlipidemia, iron deficiency anemia, who is referred for depression.    1. Obsessive-compulsive disorder, unspecified type 2. Severe episode of recurrent major depressive disorder, without psychotic features ([place]) Acute stressors include:being sued by her niece.   Other stressors include:    History: worsening in obsessive thoughts since hysterectomy in 2019  There has been overall improvement in ego dystonic obsessive thoughts about infidelity of her husband, and depressive symptoms after uptitration of fluoxetine. However, she continues although she continues to struggle, particularly with difficulty in concentration.  We uptitrate fluoxetine to optimize treatment for OCD and depression.  Notably, although she reports occasional zoning experiences, she denies attributing them to medication side effects.  # Insomnia She reports snoring, middle insomnia.  We make referral for evaluation of sleep apnea.   Plan Increase fluoxetine 80 mg daily  Next appointment: 4/4 at 11:30 for 30 mins, in person   The patient demonstrates the following risk factors for suicide: Chronic risk factors for suicide include: psychiatric disorder of depression . Acute risk factors for suicide include: N/A. Protective factors for this patient include: positive social support and hope for the future. Considering these factors, the overall suicide risk at this point appears to be low. Patient is appropriate for outpatient follow up. Emergency resources which includes 911, ED, suicide crisis line [phone]) are discussed.           Collaboration of Care: Collaboration of Care: Other reviewed notes in Epic  Patient/Guardian was advised Release of Information must be obtained prior to any record release in order to collaborate their care with an outside provider. Patient/Guardian was advised if they have not already done so to contact the registration department to sign all necessary forms in order for [name] to release information regarding their care.   Consent: Patient/Guardian gives verbal consent for treatment and assignment of benefits for services provided during this visit. Patient/Guardian expressed understanding and agreed to proceed.    Norman Clay, MD 05/18/2022, 1:30 PM

## 2022-05-18 ENCOUNTER — Encounter: Payer: Self-pay | Admitting: Psychiatry

## 2022-05-18 ENCOUNTER — Ambulatory Visit: Payer: BC Managed Care – PPO | Admitting: Psychiatry

## 2022-05-18 VITALS — BP 129/82 | HR 103 | Temp 98.5°F | Ht 60.0 in | Wt 170.6 lb

## 2022-05-18 DIAGNOSIS — G4700 Insomnia, unspecified: Secondary | ICD-10-CM | POA: Diagnosis not present

## 2022-05-18 DIAGNOSIS — F332 Major depressive disorder, recurrent severe without psychotic features: Secondary | ICD-10-CM | POA: Diagnosis not present

## 2022-05-18 DIAGNOSIS — F429 Obsessive-compulsive disorder, unspecified: Secondary | ICD-10-CM

## 2022-05-18 MED ORDER — FLUOXETINE HCL 40 MG PO CAPS: 80.0000 mg | ORAL_CAPSULE | Freq: Every day | ORAL | 0 refills | Status: DC

## 2022-05-18 NOTE — Patient Instructions (Signed)
Increase fluoxetine 80 mg daily  Next appointment: 4/4 at 11:30

## 2022-05-19 ENCOUNTER — Ambulatory Visit: Payer: BC Managed Care – PPO | Admitting: Clinical

## 2022-05-19 DIAGNOSIS — F332 Major depressive disorder, recurrent severe without psychotic features: Secondary | ICD-10-CM | POA: Diagnosis not present

## 2022-05-19 DIAGNOSIS — F419 Anxiety disorder, unspecified: Secondary | ICD-10-CM

## 2022-05-19 NOTE — Progress Notes (Signed)
[place] Counselor/Therapist Progress Note  Patient ID: [name], MRN: [id number],    Date: 05/19/2022  Time Spent: 9:36am - 10:15am : 39 minutes   Treatment Type: Individual Therapy  Reported Symptoms: Patient reported feeling overwhelmed and experiencing tremors in hands when feeling anxious  Mental Status Exam: Appearance:  Well Groomed     Behavior: Appropriate  Motor: Normal  Speech/Language:  Clear and Coherent  Affect: Appropriate  Mood: normal  Thought process: normal  Thought content:   WNL  Sensory/Perceptual disturbances:   WNL  Orientation: oriented to person, place, and situation  Attention: Good  Concentration: Good  Memory: WNL  Fund of knowledge:  Good  Insight:   Good  Judgment:  Good  Impulse Control: Good   Risk Assessment: Danger to Self:  No Patient denied current suicidal ideation, patient reported a history of thoughts "that everybody would be better off if I was dead" but denied plan or intent.  Self-injurious Behavior: No Danger to Others: No Patient denied current homicidal ideation Duty to Warn:no Physical Aggression / Violence:No  Access to Firearms a concern: No  Gang Involvement:No   Subjective: Patient reported recent legal issues and court proceedings related to her father's funeral expenses. Patient reported she is currently out of work due to her hands shaking at work when using the machines and stated, "everything starts to become a blur". Patient reported her relationship with her husband has improved since last session. Patient stated, "Its hard for me to think clear" and reported when trying to perform a task "its like I can't get my body to move". Patient reported she is currently receiving iron infusions. Patient reported she is currently seeing psychiatrist, Dr. Modesta Messing. Patient reported psychiatrist increased Prozac to '80mg'$  during yesterday's visit. Patient stated, "I've gotten a lot better than I was". Patient  reported when in a hurry she becomes confused, starts shaking, and experiences difficulty completing tasks. Patient reported feeling overwhelmed at times. Patient identified court proceedings and multiple medical appointments as barriers to treatment. Patient stated, "a lot of times Im ok and sometimes I get to a spot where I zone out" in response to patient's mood. Patient reported a decrease in depressed mood since last session and stated,  "right now I feel pretty good". Patient reported conflict with her sister regarding her father's estate is a current stressor. Patient stated, "It felt like I was going backwards and I wanted to catch it before I did" in response to reason for today's visit and patient's motivation for resuming therapy. Patient reported her hands itch when feeling anxious. Patient agreed to updated treatment goals.   Interventions: Cognitive Behavioral Therapy and Motivational Interviewing. Clinician conducted session in person at clinician's office at [facility]. Reviewed events since last session. Assessed symptoms since last session. Discussed recent stressors. Discussed patient's absence from therapy and barriers to attending appointments/participation in therapy. Provided psycho education related to the importance of patient's participation in therapy to address patient's clinical needs. Discussed patient following up with primary care provider to discuss itching on patient's hands and following up with psychiatrist, Dr. Modesta Messing, to discuss patient's concerns regarding medication. Reviewed and updated goals for therapy. Clinician requested patient complete thought record for homework.     Diagnosis:  Major Depressive Disorder, recurrent, severe with psychotic features  Anxiety Disorder Unspecified   Plan: Patient is to utilize Cognitive Behavioral Therapy, coping strategies, and mindfulness to decrease symptoms associated with Major Depressive Disorder.   Long-term goal:  Patient stated, "to be happier with myself" and not experience thoughts of husband being unfaithful as a long term goal.    Reduce overall frequency and intensity of anxiety and depressive symptoms as evidenced by decreased depressed mood, loss of interest, loss of motivation, anger, difficulty falling asleep and staying asleep, concentration, feeling overwhelmed, tremors when anxious, and delusions from 7 days per week to 0 to 1 days per week   Short-term goal:  Develop an understanding of the relationship between symptoms of depression and anxiety and the impact on patient's thoughts and behaviors   Identify, challenge, and re-frame cognitive distortions   Identify, challenge, and replace negative core beliefs/schemas with positive and empowering beliefs        Katherina Right, LCSW

## 2022-05-19 NOTE — Progress Notes (Signed)
                Amarii Amy, LCSW 

## 2022-05-20 ENCOUNTER — Inpatient Hospital Stay: Payer: BC Managed Care – PPO | Attending: Internal Medicine

## 2022-05-20 ENCOUNTER — Other Ambulatory Visit: Payer: Self-pay | Admitting: Psychiatry

## 2022-05-20 VITALS — BP 126/82 | HR 81 | Temp 97.9°F | Resp 18

## 2022-05-20 DIAGNOSIS — D509 Iron deficiency anemia, unspecified: Secondary | ICD-10-CM | POA: Diagnosis not present

## 2022-05-20 MED ORDER — SODIUM CHLORIDE 0.9 % IV SOLN
200.0000 mg | Freq: Once | INTRAVENOUS | Status: AC
  Administered 2022-05-20: 200 mg via INTRAVENOUS
  Filled 2022-05-20: qty 200

## 2022-05-20 MED ORDER — SODIUM CHLORIDE 0.9 % IV SOLN
Freq: Once | INTRAVENOUS | Status: AC
  Filled 2022-05-20: qty 250

## 2022-05-20 NOTE — Progress Notes (Signed)
Pt has been educated and understands. Pt refused to stay 30 mins after iron infusion. VSS. 

## 2022-05-25 ENCOUNTER — Inpatient Hospital Stay: Payer: BC Managed Care – PPO

## 2022-05-25 VITALS — BP 123/69 | HR 77 | Temp 98.6°F | Resp 16

## 2022-05-25 DIAGNOSIS — D509 Iron deficiency anemia, unspecified: Secondary | ICD-10-CM

## 2022-05-25 MED ORDER — SODIUM CHLORIDE 0.9 % IV SOLN
Freq: Once | INTRAVENOUS | Status: AC
  Filled 2022-05-25: qty 250

## 2022-05-25 MED ORDER — SODIUM CHLORIDE 0.9 % IV SOLN
200.0000 mg | Freq: Once | INTRAVENOUS | Status: AC
  Administered 2022-05-25: 200 mg via INTRAVENOUS
  Filled 2022-05-25: qty 200

## 2022-05-25 NOTE — Progress Notes (Signed)
Pt declined to wait for 30 minute observation period after her venofer infusion.  Pt with no complaints, tolerated infusion well.

## 2022-05-25 NOTE — Patient Instructions (Signed)

## 2022-05-27 ENCOUNTER — Ambulatory Visit: Payer: BC Managed Care – PPO

## 2022-05-31 MED FILL — Iron Sucrose Inj 20 MG/ML (Fe Equiv): INTRAVENOUS | Qty: 10 | Status: AC

## 2022-06-01 ENCOUNTER — Inpatient Hospital Stay: Payer: BC Managed Care – PPO

## 2022-06-08 ENCOUNTER — Ambulatory Visit: Payer: BC Managed Care – PPO | Admitting: Clinical

## 2022-06-08 ENCOUNTER — Ambulatory Visit: Payer: BC Managed Care – PPO

## 2022-06-08 DIAGNOSIS — F419 Anxiety disorder, unspecified: Secondary | ICD-10-CM | POA: Diagnosis not present

## 2022-06-08 DIAGNOSIS — F333 Major depressive disorder, recurrent, severe with psychotic symptoms: Secondary | ICD-10-CM

## 2022-06-08 NOTE — Progress Notes (Signed)
[facility] Counselor/Therapist Progress Note  Patient ID: [name], MRN: [id number],    Date: 06/08/2022  Time Spent: 9:34am - 10:29am : 55 minutes   Treatment Type: Individual Therapy  Reported Symptoms: Patient reported irritability, anxiety, feelings of emptiness and feeling lonely  Mental Status Exam: Appearance:  Neat     Behavior: Appropriate  Motor: Normal  Speech/Language:  Clear and Coherent  Affect: Appropriate  Mood: anxious  Thought process: normal  Thought content:   WNL  Sensory/Perceptual disturbances:   WNL  Orientation: oriented to person, place, and situation  Attention: Good  Concentration: Good  Memory: WNL  Fund of knowledge:  Good  Insight:   Fair  Judgment:  Fair  Impulse Control: Good   Risk Assessment: Danger to Self:  No Patient denied current suicidal ideation Self-injurious Behavior: No Danger to Others: No Patient denied current homicidal ideation Duty to Warn:no Physical Aggression / Violence:No  Access to Firearms a concern: No  Gang Involvement:No   Subjective: Patient reported she continues to interpret meanings from songs and believes her husband is having an affair when she hears songs. Patient reported the phone is a trigger for irritability, feeling shaky, feeling nervous and reported difficulty utilizing her phone as a result.  Patient reported coloring is a coping mechanism she utilizes in response. Patient reported feelings of emptiness. Patient reported thoughts related to songs and a belief that her husband is having an affair have increased recently. Patient reported experiencing symptoms of anxiety when driving alone. Patient reported feeling she requires others to "help her think" at times. Patient reported a fear of being alone and reported feeling alone when she is not with her husband or daughter. Patient reported her psychiatrist is not aware of current symptoms. Patient reported recent increase in anxiety.  Patient reported feeling anxious today and stated, "kind of down". Patient reported her husband was playing music recently and working on a model when patient began to have thoughts that her husband was thinking about someone else. During session, patient vocalized evidence contrary to her belief regarding her husband.   Interventions: Cognitive Behavioral Therapy. Clinician conducted session in person at clinician's office at [facility]. Assessed current symptoms. Explored and identified triggers for feelings of anxiety and thoughts associated with husband having an affair. Provided psycho education related to depressive symptoms. Discussed patient having a conversation with her psychiatrist to discuss recent symptoms and patient's questions related to returning to work. Reviewed patient's thought record. Challenged thought distortions and explored evidence to the contrary of patient's beliefs regarding concern her husband is having an affair. Clinician requested patient complete thought record for homework.     Diagnosis:  Major Depressive Disorder, recurrent, severe with psychotic features   Anxiety Disorder Unspecified   Plan: Patient is to utilize Cognitive Behavioral Therapy, coping strategies, and mindfulness to decrease symptoms associated with Major Depressive Disorder.   Long-term goal:   Patient stated, "to be happier with myself" and not experience thoughts of husband being unfaithful as a long term goal.    Reduce overall frequency and intensity of anxiety and depressive symptoms as evidenced by decreased depressed mood, loss of interest, loss of motivation, anger, difficulty falling asleep and staying asleep, concentration, feeling overwhelmed, tremors when anxious, and delusions from 7 days per week to 0 to 1 days per week   Short-term goal:  Develop an understanding of the relationship between symptoms of depression and anxiety and the impact on patient's thoughts and  behaviors  Identify, challenge, and re-frame cognitive distortions   Identify, challenge, and replace negative core beliefs/schemas with positive and empowering beliefs  Katherina Right, LCSW

## 2022-06-08 NOTE — Progress Notes (Signed)
                Kincade Granberg, LCSW 

## 2022-06-09 ENCOUNTER — Ambulatory Visit (INDEPENDENT_AMBULATORY_CARE_PROVIDER_SITE_OTHER): Payer: BC Managed Care – PPO | Admitting: Pulmonary Disease

## 2022-06-09 ENCOUNTER — Encounter: Payer: Self-pay | Admitting: Pulmonary Disease

## 2022-06-09 VITALS — BP 118/78 | HR 94 | Ht 60.0 in | Wt 170.0 lb

## 2022-06-09 DIAGNOSIS — F5104 Psychophysiologic insomnia: Secondary | ICD-10-CM | POA: Diagnosis not present

## 2022-06-09 MED ORDER — ESZOPICLONE 2 MG PO TABS: 2.0000 mg | ORAL_TABLET | Freq: Every evening | ORAL | 2 refills | Status: DC | PRN

## 2022-06-09 NOTE — Patient Instructions (Signed)
Prescription for Lunesta sent to pharmacy for you  Continue with head of the bed elevation at bedtime  Graded exercises as tolerated  I will see you in about 3 months  Call with significant concerns  We are starting the Lunesta 2 mg, we can increase to 3 mg if the effect is not optimal

## 2022-06-09 NOTE — Progress Notes (Signed)
[name]    [id number]    Aug 31, 1965  Primary Care Physician:Clark, Leticia Penna, NP  Referring Physician: [name], NP [place],   [id number]  Chief complaint:    Patient being seen for insomnia   HPI:  Difficulty falling asleep, difficulty staying asleep  Has had a problem for many years  History of depression for which medications are being optimized at present  Usually goes to bed between 9 and 11 PM, sometimes takes up to 2 to 3 hours to fall asleep. Sometimes she does not fall asleep at all  3 or more awakenings Final wake up time between 530 and 6:00  Weight is up about 20 to 25 lbs  Has a history of hypertension, chronic headaches  Does admit to snoring, dryness of the mouth in the morning She does suffer from headaches  Memory is fair  No difficulty staying awake during the day, she does feel fatigued during the day,  Does not smoke  She has tried over-the-counter medications including Benadryl, Unisom with no significant event  She was placed on trazodone at a point, given with dreams  Currently uses Prozac for depression and feels symptoms of depression are better controlled at present  Sleeps with the head of the bed elevated for her reflux   Outpatient Encounter Medications as of 06/09/2022  Medication Sig   cyclobenzaprine (FLEXERIL) 5 MG tablet Take one po qhs prn muscle spasm   eszopiclone (LUNESTA) 2 MG TABS tablet Take 1 tablet (2 mg total) by mouth at bedtime as needed for sleep. Take about 30 minutes before bedtime   ferrous sulfate 325 (65 FE) MG EC tablet Take 325 mg by mouth 3 (three) times daily with meals.   FLUoxetine (PROZAC) 40 MG capsule TAKE 1 CAPSULE (40 MG TOTAL) BY MOUTH DAILY. FOR ANXIETY AND DEPRESSION.   FLUoxetine (PROZAC) 40 MG capsule Take 2 capsules (80 mg total) by mouth daily.   losartan (COZAAR) 50 MG tablet TAKE 1 TABLET (50 MG TOTAL) BY MOUTH DAILY. FOR BLOOD PRESSURE.    nitrofurantoin (MACRODANTIN) 100 MG capsule Take 1 capsule (100 mg total) by mouth daily.   pantoprazole (PROTONIX) 40 MG tablet Take 1 tablet (40 mg total) by mouth daily. For heartburn.   polyethylene glycol-electrolytes (NULYTELY) 420 g solution Prepare according to package instructions. Starting at 5:00 PM: Drink one 8 oz glass of mixture every 15 minutes until you finish half of the jug. Five hours prior to procedure, drink 8 oz glass of mixture every 15 minutes until it is all gone. Make sure you do not drink anything 4 hours prior to your procedure.   [DISCONTINUED] atorvastatin (LIPITOR) 20 MG tablet Take 1 tablet (20 mg total) by mouth every evening. For cholesterol.   [DISCONTINUED] traZODone (DESYREL) 50 MG tablet    No facility-administered encounter medications on file as of 06/09/2022.    Allergies as of 06/09/2022 - Review Complete 06/09/2022  Allergen Reaction Noted   Topamax [topiramate]  03/19/2019   Tramadol  03/19/2019   Gluten meal Diarrhea and Nausea Only 11/27/2012   Ondansetron Other (See Comments) 04/28/2012    Past Medical History:  Diagnosis Date   Anemia    BV (bacterial vaginosis) 11/27/2012   Celiac disease    Cough due to ACE inhibitor 04/25/2019   Depression    Essential hypertension    Family history of adverse reaction to anesthesia    MOM-HARD TIME  WAKING UP   GERD (gastroesophageal reflux disease)    Migraine with visual aura    MIGRAINES   UTI (lower urinary tract infection)     Past Surgical History:  Procedure Laterality Date   ABDOMINAL HYSTERECTOMY     BLADDER SUSPENSION     COLONOSCOPY WITH PROPOFOL N/A 05/11/2022   Procedure: COLONOSCOPY WITH PROPOFOL;  Surgeon: Jonathon Bellows, MD;  Location: [facility] ENDOSCOPY;  Service: Gastroenterology;  Laterality: N/A;   ESOPHAGOGASTRODUODENOSCOPY (EGD) WITH PROPOFOL N/A 05/11/2022   Procedure: ESOPHAGOGASTRODUODENOSCOPY (EGD) WITH PROPOFOL;  Surgeon: Jonathon Bellows, MD;  Location: [facility] ENDOSCOPY;  Service:  Gastroenterology;  Laterality: N/A;   EXPLORATORY LAPAROTOMY     IUD REMOVAL  07/25/2017   Procedure: INTRAUTERINE DEVICE (IUD) REMOVAL;  Surgeon: Harlin Heys, MD;  Location: [facility] ORS;  Service: Gynecology;;   LAPAROSCOPIC ASSISTED VAGINAL HYSTERECTOMY Bilateral 07/25/2017   Procedure: LAPAROSCOPIC ASSISTED VAGINAL HYSTERECTOMY WITH BILATERAL [place] OOPHERECTOMY;  Surgeon: Harlin Heys, MD;  Location: [facility] ORS;  Service: Gynecology;  Laterality: Bilateral;   TUBAL LIGATION      Family History  Problem Relation Age of Onset   Hypertension Mother    Stroke Mother    Cancer Father    Hypertension Maternal Grandmother    Colon cancer Maternal Grandmother    Breast cancer Maternal Grandmother    Skin cancer Maternal Grandmother    Hypertension Maternal Grandfather    Hypertension Paternal Grandmother    Diabetes Paternal Grandmother    Hypertension Paternal Grandfather    Diabetes Son 15       type 1    Social History   Socioeconomic History   Marital status: Married    Spouse name: [name]   Number of children: 3   Years of education: 12   Highest education level: High school graduate  Occupational History   Occupation: Personnel officer  Tobacco Use   Smoking status: Never   Smokeless tobacco: Never  Scientific laboratory technician Use: Never used  Substance and Sexual Activity   Alcohol use: No    Alcohol/week: 0.0 standard drinks of alcohol   Drug use: No   Sexual activity: Yes    Partners: Female    Birth control/protection: Surgical    Comment: INTERCOURSE AGE 59, SEXUAL PARTNERS LEES THAN 6  Other Topics Concern   Not on file  Social History Narrative   Lives with her husband and their three children, and her daughter's boyfriend.  Her older son's daughter lives there part-time as well.   Social Determinants of Health   Financial Resource Strain: Not on file  Food Insecurity: Not on file  Transportation Needs: Not on file  Physical Activity: Not  on file  Stress: Not on file  Social Connections: Not on file  Intimate Partner Violence: Not on file    Review of Systems  Respiratory:  Negative for apnea and shortness of breath.   Psychiatric/Behavioral:  Positive for sleep disturbance.     Vitals:   06/09/22 1522  BP: 118/78  Pulse: 94  SpO2: 94%     Physical Exam Constitutional:      Appearance: She is obese.  HENT:     Head: Normocephalic.     Mouth/Throat:     Mouth: Mucous membranes are moist.  Eyes:     General: No scleral icterus.    Pupils: Pupils are equal, round, and reactive to light.  Cardiovascular:     Rate and Rhythm: Normal rate and regular rhythm.  Heart sounds: No murmur heard.    No friction rub.  Pulmonary:     Effort: No respiratory distress.     Breath sounds: No stridor. No wheezing or rhonchi.  Musculoskeletal:     Cervical back: No rigidity.  Neurological:     Mental Status: She is alert.       06/09/2022    3:00 PM  Results of the Ep[facility] flowsheet  Sitting and reading 0  Watching TV 0  Sitting, inactive in a public place (e.g. a theatre or a meeting) 0  As a passenger in a car for an hour without a break 0  Lying down to rest in the afternoon when circumstances permit 1  Sitting and talking to someone 0  Sitting quietly after a lunch without alcohol 0  In a car, while stopped for a few minutes in traffic 0  Total score 1   Data Reviewed: No previous sleep study on record  Assessment:  Chronic insomnia  History of snoring, no significant daytime symptoms -Low likelihood of significant sleep disordered breathing  Plan/Recommendations: Medications that can be used in the treatment of insomnia was discussed with the patient She did fail trazodone in the past  Will try Lunesta 2 mg Dose can be escalated at night as needed  Encouraged to continue treatment for depression  Graded exercises recommended  Encouraged to call with significant concerns  Follow-up in  about 3 to 4 months   Sherrilyn Rist MD [facility] 06/09/2022, 4:44 PM  CC: [name], NP

## 2022-06-10 ENCOUNTER — Inpatient Hospital Stay: Payer: BC Managed Care – PPO

## 2022-06-10 VITALS — BP 134/83 | HR 89 | Temp 97.9°F | Resp 16

## 2022-06-10 DIAGNOSIS — D509 Iron deficiency anemia, unspecified: Secondary | ICD-10-CM

## 2022-06-10 MED ORDER — SODIUM CHLORIDE 0.9 % IV SOLN
Freq: Once | INTRAVENOUS | Status: AC
  Filled 2022-06-10: qty 250

## 2022-06-10 MED ORDER — SODIUM CHLORIDE 0.9 % IV SOLN
200.0000 mg | Freq: Once | INTRAVENOUS | Status: AC
  Administered 2022-06-10: 200 mg via INTRAVENOUS
  Filled 2022-06-10: qty 200

## 2022-06-20 ENCOUNTER — Other Ambulatory Visit: Payer: Self-pay | Admitting: Family

## 2022-06-20 DIAGNOSIS — I10 Essential (primary) hypertension: Secondary | ICD-10-CM

## 2022-06-29 ENCOUNTER — Encounter: Payer: Self-pay | Admitting: Internal Medicine

## 2022-07-06 ENCOUNTER — Ambulatory Visit: Payer: 59 | Admitting: Clinical

## 2022-07-13 ENCOUNTER — Ambulatory Visit (INDEPENDENT_AMBULATORY_CARE_PROVIDER_SITE_OTHER): Payer: 59 | Admitting: Clinical

## 2022-07-13 DIAGNOSIS — F419 Anxiety disorder, unspecified: Secondary | ICD-10-CM | POA: Diagnosis not present

## 2022-07-13 DIAGNOSIS — F333 Major depressive disorder, recurrent, severe with psychotic symptoms: Secondary | ICD-10-CM

## 2022-07-13 NOTE — Progress Notes (Signed)
                Jaquitta Dupriest, LCSW 

## 2022-07-13 NOTE — Progress Notes (Signed)
[facility] Counselor/Therapist Progress Note  Patient ID: [name], MRN: [id number],    Date: 07/13/2022  Time Spent: 8:36am - 9:25am : 49 minutes   Treatment Type: Individual Therapy  Reported Symptoms: Patient reported symptoms of anxiety and difficulty focusing when driving.   Mental Status Exam: Appearance:  Well Groomed     Behavior: Appropriate  Motor: Normal  Speech/Language:  Clear and Coherent  Affect: Appropriate  Mood: normal  Thought process: normal  Thought content:   WNL  Sensory/Perceptual disturbances:   WNL  Orientation: oriented to person, place, and situation  Attention: Good  Concentration: Good  Memory: WNL  Fund of knowledge:  Good  Insight:   Fair  Judgment:  Fair  Impulse Control: Good   Risk Assessment: Danger to Self:  Nod Patient denied current suicidal ideation  Self-injurious Behavior: No Danger to Others: No Patient denied current homicidal ideation  Duty to Warn:no Physical Aggression / Violence:No  Access to Firearms a concern: No  Gang Involvement:No   Subjective: Patient reported she was unable to drive to her appointment by herself which prompted patient to miss her last appointment.  Patient stated "unless I have to" patient reported she does not drive alone. Patient reported in the past she did not experience difficulty driving. Patient reported she started experience difficulty driving alone when she started to experience the belief that her husband was having an affair. Patient stated, "I feel like I go in a daze and I'm scared I'm going to mess up". Patient reported she becomes hot, starts to sweat, experiences changes in vision, and is scared she will have an accident and harm someone when driving alone. Patient reported she has an appointment with the psychiatrist this week and returns to work on Thursday of this week. Patient reported she is unable to participate in virtual visits due to the reception on her phone.  Patient reported she asked her daughter to provide transportation to today's appointment. Patient reported she is exploring the option of not working due to the family's needs. Patient stated, "I feel pretty good" in response to current mood.   Interventions:  Problem Solving and Motivational Interviewing . Clinician conducted session in person at clinician's office at [facility]. Discussed patient's recent missed appointment. Provided psycho education related to [facility] no show/late cancel policy and the importance of maintaining appointments. Explored barriers to attending appointments, transportation resources, and patient's support system. Clinician requested patient complete thought record for homework.     Diagnosis:  Major Depressive Disorder, recurrent, severe with psychotic features   Anxiety Disorder Unspecified   Plan: Patient is to utilize Cognitive Behavioral Therapy, coping strategies, and mindfulness to decrease symptoms associated with Major Depressive Disorder.   Long-term goal:   Patient stated, "to be happier with myself" and not experience thoughts of husband being unfaithful as a long term goal.    Reduce overall frequency and intensity of anxiety and depressive symptoms as evidenced by decreased depressed mood, loss of interest, loss of motivation, anger, difficulty falling asleep and staying asleep, concentration, feeling overwhelmed, tremors when anxious, and delusions from 7 days per week to 0 to 1 days per week   Short-term goal:  Develop an understanding of the relationship between symptoms of depression and anxiety and the impact on patient's thoughts and behaviors   Identify, challenge, and re-frame cognitive distortions   Identify, challenge, and replace negative core beliefs/schemas with positive and empowering beliefs    Ariel Right, LCSW

## 2022-07-13 NOTE — Progress Notes (Unsigned)
BH MD/PA/NP OP Progress Note  07/15/2022 12:02 PM [name]  MRN:  [id number]  Chief Complaint:  Chief Complaint  Patient presents with   Follow-up   HPI:  - [name] was started by her pulmonologist  This is a follow-up appointment for OCD, depression.  She states that she has been feeling anxious.  She has panic attacks when she drives herself.  She gets sweaty, and grapes her hands.  She has been out of work since around January due to her condition.  She is unsure if she is able to be back, although she needs to return tomorrow.  She will go with her husband as he is unable to drive at times.  Her daughter will pick her up around the 57.  Although she used to enjoy working on model cars, she cannot do it due to difficulty in concentration.  She tends to feel stressed when she tries coloring.  She tends to eat more when she feels stressed.  She feels fidgety.  She thinks thoughts are coming more frequently- she thinks about her husband's infidelity, or whether or not to continue to work. The patient has mood symptoms as in PHQ-9/GAD-7.  Although she reports passive SI, she had a he denies any plan or intent.  She agrees to contact emergency resources if any worsening.  Although her husband has guns at home, those are locked, and she does not have access to it.  She sleeps better since being on Lunesta, prescribed by the pulmonologist.  She does not think there has been any change since uptitration of fluoxetine. She asks if she needs to apply for a disability.  I discussed with the patient the importance of assessing her response to the medication, especially considering she has been under my care for only a few months.   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 She reports good support from her parents  Wt Readings from Last 3 Encounters:  07/15/22 172 lb 3.2 oz (78.1 kg)  06/09/22 170 lb (77.1 kg)  05/18/22 170 lb 9.6 oz (77.4 kg)      Visit Diagnosis:    ICD-10-CM   1. Obsessive-compulsive disorder, unspecified type  F42.9     2. MDD (major depressive disorder), recurrent episode, moderate  F33.1       Past Psychiatric History: Please see initial evaluation for full details. I have reviewed the history. No updates at this time.     Past Medical History:  Past Medical History:  Diagnosis Date   Anemia    BV (bacterial vaginosis) 11/27/2012   Celiac disease    Cough due to ACE inhibitor 04/25/2019   Depression    Essential hypertension    Family history of adverse reaction to anesthesia    MOM-HARD TIME WAKING UP   GERD (gastroesophageal reflux disease)    Migraine with  visual aura    MIGRAINES   UTI (lower urinary tract infection)     Past Surgical History:  Procedure Laterality Date   ABDOMINAL HYSTERECTOMY     BLADDER SUSPENSION     COLONOSCOPY WITH PROPOFOL N/A 05/11/2022   Procedure: COLONOSCOPY WITH PROPOFOL;  Surgeon: Jonathon Bellows, MD;  Location: [facility] ENDOSCOPY;  Service: Gastroenterology;  Laterality: N/A;   ESOPHAGOGASTRODUODENOSCOPY (EGD) WITH PROPOFOL N/A 05/11/2022   Procedure: ESOPHAGOGASTRODUODENOSCOPY (EGD) WITH PROPOFOL;  Surgeon: Jonathon Bellows, MD;  Location: [facility] ENDOSCOPY;  Service: Gastroenterology;  Laterality: N/A;   EXPLORATORY LAPAROTOMY     IUD REMOVAL  07/25/2017   Procedure: INTRAUTERINE DEVICE (IUD) REMOVAL;  Surgeon: Harlin Heys, MD;  Location:  [facility] ORS;  Service: Gynecology;;   LAPAROSCOPIC ASSISTED VAGINAL HYSTERECTOMY Bilateral 07/25/2017   Procedure: LAPAROSCOPIC ASSISTED VAGINAL HYSTERECTOMY WITH BILATERAL [place] OOPHERECTOMY;  Surgeon: Harlin Heys, MD;  Location: [facility] ORS;  Service: Gynecology;  Laterality: Bilateral;   TUBAL LIGATION      Family Psychiatric History: Please see initial evaluation for full details. I have reviewed the history. No updates at this time.     Family History:  Family History  Problem Relation Age of Onset   Hypertension Mother    Stroke Mother    Cancer Father    Hypertension Maternal Grandmother    Colon cancer Maternal Grandmother    Breast cancer Maternal Grandmother    Skin cancer Maternal Grandmother    Hypertension Maternal Grandfather    Hypertension Paternal Grandmother    Diabetes Paternal Grandmother    Hypertension Paternal Grandfather    Diabetes Son 74       type 1    Social History:  Social History   Socioeconomic History   Marital status: Married    Spouse name: [name]   Number of children: 3   Years of education: 12   Highest education level: High school graduate  Occupational History   Occupation: Personnel officer  Tobacco Use   Smoking status: Never   Smokeless tobacco: Never  Scientific laboratory technician Use: Never used  Substance and Sexual Activity   Alcohol use: No    Alcohol/week: 0.0 standard drinks of alcohol   Drug use: No   Sexual activity: Yes    Partners: Female    Birth control/protection: Surgical    Comment: INTERCOURSE AGE 66, SEXUAL PARTNERS LEES THAN 5  Other Topics Concern   Not on file  Social History Narrative   Lives with her husband and their three children, and her daughter's boyfriend.  Her older son's daughter lives there part-time as well.   Social Determinants of Health   Financial Resource Strain: Not on file  Food Insecurity: Not on file  Transportation Needs: Not on file  Physical Activity: Not on file   Stress: Not on file  Social Connections: Not on file    Allergies:  Allergies  Allergen Reactions   Topamax [Topiramate]     "cant function"    Tramadol     Cant function with it    Gluten Meal Diarrhea and Nausea Only        Ondansetron Other (See Comments)    Helps her nausea, but makes HA worse    Metabolic Disorder Labs: Lab Results  Component Value Date   HGBA1C 5.9 09/09/2020   MPG 108.28 02/24/2019   Lab Results  Component Value Date   PROLACTIN 7.6 06/19/2014   Lab Results  Component Value Date   CHOL  201 (H) 09/09/2020   TRIG 138.0 09/09/2020   HDL 39.50 09/09/2020   CHOLHDL 5 09/09/2020   VLDL 27.6 09/09/2020   LDLCALC 134 (H) 09/09/2020   LDLCALC 65 08/29/2019   Lab Results  Component Value Date   TSH 3.01 02/18/2022   TSH 1.79 11/27/2021    Therapeutic Level Labs: No results found for: "LITHIUM" No results found for: "VALPROATE" No results found for: "CBMZ"  Current Medications: Current Outpatient Medications  Medication Sig Dispense Refill   eszopiclone (LUNESTA) 2 MG TABS tablet Take 1 tablet (2 mg total) by mouth at bedtime as needed for sleep. Take about 30 minutes before bedtime 30 tablet 2   ferrous sulfate 325 (65 FE) MG EC tablet Take 325 mg by mouth 3 (three) times daily with meals.     FLUoxetine (PROZAC) 20 MG capsule Take 1 capsule (20 mg total) by mouth daily. 90 capsule 0   FLUoxetine (PROZAC) 40 MG capsule TAKE 1 CAPSULE (40 MG TOTAL) BY MOUTH DAILY. FOR ANXIETY AND DEPRESSION. 90 capsule 1   FLUoxetine (PROZAC) 40 MG capsule Take 2 capsules (80 mg total) by mouth daily. 180 capsule 0   losartan (COZAAR) 50 MG tablet TAKE 1 TABLET (50 MG TOTAL) BY MOUTH DAILY. FOR BLOOD PRESSURE. 90 tablet 0   nitrofurantoin (MACRODANTIN) 100 MG capsule Take 1 capsule (100 mg total) by mouth daily. 30 capsule 11   pantoprazole (PROTONIX) 40 MG tablet Take 1 tablet (40 mg total) by mouth daily. For heartburn. 90 tablet 1   QUEtiapine (SEROQUEL)  25 MG tablet Take 1 tablet (25 mg total) by mouth at bedtime. 30 tablet 1   cyclobenzaprine (FLEXERIL) 5 MG tablet Take one po qhs prn muscle spasm (Patient not taking: Reported on 07/15/2022) 30 tablet 0   polyethylene glycol-electrolytes (NULYTELY) 420 g solution Prepare according to package instructions. Starting at 5:00 PM: Drink one 8 oz glass of mixture every 15 minutes until you finish half of the jug. Five hours prior to procedure, drink 8 oz glass of mixture every 15 minutes until it is all gone. Make sure you do not drink anything 4 hours prior to your procedure. (Patient not taking: Reported on 07/15/2022) 4000 mL 0   No current facility-administered medications for this visit.     Musculoskeletal: Strength & Muscle Tone: within normal limits Gait & Station: normal Patient leans: N/A  Psychiatric Specialty Exam: Review of Systems  Psychiatric/Behavioral:  Positive for decreased concentration, dysphoric mood, sleep disturbance and suicidal ideas. Negative for agitation, behavioral problems, confusion, hallucinations and self-injury. The patient is nervous/anxious. The patient is not hyperactive.   All other systems reviewed and are negative.   Blood pressure (!) 149/89, pulse 86, temperature 97.8 F (36.6 C), temperature source Skin, height 5' (1.524 m), weight 172 lb 3.2 oz (78.1 kg).Body mass index is 33.63 kg/m.  General Appearance: Fairly Groomed  Eye Contact:  Good  Speech:  Clear and Coherent  Volume:  Normal  Mood:  Anxious  Affect:  Appropriate, Congruent, and tense  Thought Process:  Coherent  Orientation:  Full (Time, Place, and Person)  Thought Content: Logical   Suicidal Thoughts:  Yes.  without intent/plan  Homicidal Thoughts:  No  Memory:  Immediate;   Good  Judgement:  Good  Insight:  Good  Psychomotor Activity:  Normal  Concentration:  Concentration: Good and Attention Span: Good  Recall:  Good  Fund of Knowledge: Good  Language: Good  Akathisia:  No   Handed:  Right  AIMS (if indicated): not done  Assets:  Communication Skills Desire for Improvement  ADL's:  Intact  Cognition: WNL  Sleep:  Poor   Screenings: GAD-7    Flowsheet Row Office Visit from 05/18/2022 in [place] Office Visit from 02/25/2022 in [facility] at [place] Visit from 02/04/2022 in [facility]  Total GAD-7 Score 15 10 20       PHQ2-9    [place] Office Visit from 07/15/2022 in [place] Office Visit from 05/18/2022 in [place] Office Visit from 02/25/2022 in [facility] [place] Visit from 02/04/2022 in [facility] Office Visit from 12/28/2021 in [facility] at [place]  PHQ-2 Total Score 5 4 4 5 2   PHQ-9 Total Score 16 21 15 21 7       [place] Office Visit from 07/15/2022 in [place] Office Visit from 05/18/2022 in [facility] Admission (Discharged) from 05/11/2022 in [facility] Error: Q3, 4, or 5 should not be populated when Q2 is No Error: Q3, 4, or 5 should not be populated when Q2 is No No Risk        Assessment and Plan:  [name] is a 57 y.o. year old female with a history of depression, hypertension, hyperlipidemia, iron deficiency anemia, who is referred for depression.   1. Obsessive-compulsive disorder, unspecified type 2. MDD (major depressive disorder), recurrent episode, moderate Acute stressors include:being sued by her niece.   Other stressors include: son (legally blind, age 56)     History: worsening in obsessive thoughts since hysterectomy in 2019  She continues to report ego-dystonic obsessive thoughts  about infidelity of her husband, depressive symptoms and anxiety despite uptitration of fluoxetine.  Will lower the dose due to its limited efficacy from higher dose.  Will start quetiapine as adjunctive treatment for depression and also to target anxiety, insomnia.  Discussed potential metabolic side effect, EPS and drowsiness.     # Insomnia She reports snoring, middle insomnia.  Referral was made for evaluation of sleep apnea, and she was started on Lunesta by the provider.  She has another upcoming appointment; hopefully she will get home sleep test.   Plan Decrease fluoxetine 60 mg daily (limited benefit from 80 mg) Start quetiapine 25 mg at night  Next appointment: 5/20 at 3:30 for 30 mins, in person   The patient demonstrates the following risk factors for suicide: Chronic risk factors for suicide include: psychiatric disorder of depression . Acute risk factors for suicide include: N/A. Protective factors for this patient include: positive social support and hope for the future. Considering these factors, the overall suicide risk at this point appears to be low. Patient is appropriate for outpatient follow up. Emergency resources which includes 911, ED, suicide crisis line [phone]) are discussed.        Collaboration of Care: Collaboration of Care: Other reviewed notes in Epic  Patient/Guardian was advised Release of Information must be obtained prior to any record release in order to collaborate their care with an outside provider. Patient/Guardian was advised if they have not already done so to contact the registration department to sign all necessary forms in order for [name] to release information regarding their care.   Consent: Patient/Guardian gives verbal consent for treatment  and assignment of benefits for services provided during this visit. Patient/Guardian expressed understanding and agreed to proceed.    Norman Clay, MD 07/15/2022, 12:02 PM

## 2022-07-15 ENCOUNTER — Encounter: Payer: Self-pay | Admitting: Internal Medicine

## 2022-07-15 ENCOUNTER — Encounter: Payer: Self-pay | Admitting: Psychiatry

## 2022-07-15 ENCOUNTER — Ambulatory Visit (INDEPENDENT_AMBULATORY_CARE_PROVIDER_SITE_OTHER): Payer: 59 | Admitting: Psychiatry

## 2022-07-15 VITALS — BP 149/89 | HR 86 | Temp 97.8°F | Ht 60.0 in | Wt 172.2 lb

## 2022-07-15 DIAGNOSIS — F429 Obsessive-compulsive disorder, unspecified: Secondary | ICD-10-CM | POA: Diagnosis not present

## 2022-07-15 DIAGNOSIS — F331 Major depressive disorder, recurrent, moderate: Secondary | ICD-10-CM | POA: Diagnosis not present

## 2022-07-15 MED ORDER — QUETIAPINE FUMARATE 25 MG PO TABS: 25.0000 mg | ORAL_TABLET | Freq: Every day | ORAL | 1 refills | Status: DC

## 2022-07-15 MED ORDER — FLUOXETINE HCL 20 MG PO CAPS: 20.0000 mg | ORAL_CAPSULE | Freq: Every day | ORAL | 0 refills | Status: DC

## 2022-07-15 NOTE — Patient Instructions (Signed)
Decrease fluoxetine 60 mg daily  Start quetiapine 25 mg at night  Next appointment: 5/20 at 3:30

## 2022-07-29 ENCOUNTER — Encounter: Payer: Self-pay | Admitting: Primary Care

## 2022-07-29 ENCOUNTER — Ambulatory Visit: Payer: 59 | Admitting: Primary Care

## 2022-07-29 ENCOUNTER — Encounter: Payer: Self-pay | Admitting: Internal Medicine

## 2022-07-29 VITALS — BP 120/82 | HR 90 | Temp 97.5°F | Ht 60.0 in | Wt 172.0 lb

## 2022-07-29 DIAGNOSIS — D509 Iron deficiency anemia, unspecified: Secondary | ICD-10-CM

## 2022-07-29 DIAGNOSIS — R5382 Chronic fatigue, unspecified: Secondary | ICD-10-CM

## 2022-07-29 DIAGNOSIS — R7303 Prediabetes: Secondary | ICD-10-CM

## 2022-07-29 LAB — CBC
HCT: 42.8 % (ref 36.0–46.0)
Hemoglobin: 14.4 g/dL (ref 12.0–15.0)
MCHC: 33.6 g/dL (ref 30.0–36.0)
MCV: 91.1 fl (ref 78.0–100.0)
Platelets: 250 10*3/uL (ref 150.0–400.0)
RBC: 4.7 Mil/uL (ref 3.87–5.11)
RDW: 16.4 % — ABNORMAL HIGH (ref 11.5–15.5)
WBC: 4.1 10*3/uL (ref 4.0–10.5)

## 2022-07-29 LAB — IBC + FERRITIN
Ferritin: 20.3 ng/mL (ref 10.0–291.0)
Iron: 57 ug/dL (ref 42–145)
Saturation Ratios: 16.2 % — ABNORMAL LOW (ref 20.0–50.0)
TIBC: 352.8 ug/dL (ref 250.0–450.0)
Transferrin: 252 mg/dL (ref 212.0–360.0)

## 2022-07-29 LAB — FOLATE: Folate: 16.2 ng/mL (ref >5.9)

## 2022-07-29 LAB — VITAMIN B12: Vitamin B-12: 467 pg/mL (ref 211–911)

## 2022-07-29 LAB — HEMOGLOBIN A1C: Hgb A1c MFr Bld: 5.8 % (ref 4.6–6.5)

## 2022-07-29 NOTE — Assessment & Plan Note (Signed)
Noted on labs from 2022. Repeat A1C pending.

## 2022-07-29 NOTE — Progress Notes (Signed)
Subjective:    Patient ID: Zamyiah Tino, female    DOB: April 14, 1965, 57 y.o.   MRN: [id number]  HPI  Chrystine Frogge is a very pleasant 57 y.o. female with a history of migraines, hypertension, GERD, chronic constipation, recurrent cystitis, iron deficiency anemia who presents today to discuss fatigue.   Chronic fatigue, but over the last few weeks she's noticed increased fatigue, finds herself leaning on objects from feeling tired, wanting to lay down and rest, is tired when holding her newborn granddaughter, having a hard time staying awake at work. History of iron deficiency anemia and is following with hematology. Last iron infusion was 06/10/22 and is scheduled for repeat labs on 08/30/22.   She denies vagina and rectal bleeding. She is sleeping well at night.   She has been told that she snores during the night. Her mother has mentioned something about her breathing during the night. She has never been tested for sleep apnea. She was referred to pulmonology in February 2024, was told that she likely did not have sleep apnea, was treated with Lunesta 2 mg HS.   She continues to vomit daily. Follows with GI and underwent colonoscopy and endoscopy in January 2024, large hiatal hernia with evidence of reflux. She was due in late February 2024 to have endoscopy repeated, she plans on scheduling soon.   Wt Readings from Last 3 Encounters:  07/29/22 172 lb (78 kg)  06/09/22 170 lb (77.1 kg)  05/11/22 167 lb 5.3 oz (75.9 kg)     Review of Systems  Constitutional:  Positive for fatigue.  Respiratory:         Snoring, daytime tiredness  Gastrointestinal:  Positive for vomiting. Negative for blood in stool.  Genitourinary:  Negative for vaginal bleeding.         Past Medical History:  Diagnosis Date   Anemia    BV (bacterial vaginosis) 11/27/2012   Celiac disease    Cough due to ACE inhibitor 04/25/2019   Depression    Essential hypertension    Family history of adverse reaction to  anesthesia    MOM-HARD TIME WAKING UP   GERD (gastroesophageal reflux disease)    Migraine with visual aura    MIGRAINES   UTI (lower urinary tract infection)     Social History   Socioeconomic History   Marital status: Married    Spouse name: [name]   Number of children: 3   Years of education: 12   Highest education level: High school graduate  Occupational History   Occupation: Corporate treasurer  Tobacco Use   Smoking status: Never   Smokeless tobacco: Never  Building services engineer Use: Never used  Substance and Sexual Activity   Alcohol use: No    Alcohol/week: 0.0 standard drinks of alcohol   Drug use: No   Sexual activity: Yes    Partners: Female    Birth control/protection: Surgical    Comment: INTERCOURSE AGE 36, SEXUAL PARTNERS LEES THAN 5  Other Topics Concern   Not on file  Social History Narrative   Lives with her husband and their three children, and her daughter's boyfriend.  Her older son's daughter lives there part-time as well.   Social Determinants of Health   Financial Resource Strain: Not on file  Food Insecurity: Not on file  Transportation Needs: Not on file  Physical Activity: Not on file  Stress: Not on file  Social Connections: Not on file  Intimate Partner Violence: Not on  file    Past Surgical History:  Procedure Laterality Date   ABDOMINAL HYSTERECTOMY     BLADDER SUSPENSION     COLONOSCOPY WITH PROPOFOL N/A 05/11/2022   Procedure: COLONOSCOPY WITH PROPOFOL;  Surgeon: Wyline Mood, MD;  Location: [facility] ENDOSCOPY;  Service: Gastroenterology;  Laterality: N/A;   ESOPHAGOGASTRODUODENOSCOPY (EGD) WITH PROPOFOL N/A 05/11/2022   Procedure: ESOPHAGOGASTRODUODENOSCOPY (EGD) WITH PROPOFOL;  Surgeon: Wyline Mood, MD;  Location: [facility] ENDOSCOPY;  Service: Gastroenterology;  Laterality: N/A;   EXPLORATORY LAPAROTOMY     IUD REMOVAL  07/25/2017   Procedure: INTRAUTERINE DEVICE (IUD) REMOVAL;  Surgeon: Linzie Collin, MD;  Location: [facility]  ORS;  Service: Gynecology;;   LAPAROSCOPIC ASSISTED VAGINAL HYSTERECTOMY Bilateral 07/25/2017   Procedure: LAPAROSCOPIC ASSISTED VAGINAL HYSTERECTOMY WITH BILATERAL [place] OOPHERECTOMY;  Surgeon: Linzie Collin, MD;  Location: [facility] ORS;  Service: Gynecology;  Laterality: Bilateral;   TUBAL LIGATION      Family History  Problem Relation Age of Onset   Hypertension Mother    Stroke Mother    Cancer Father    Hypertension Maternal Grandmother    Colon cancer Maternal Grandmother    Breast cancer Maternal Grandmother    Skin cancer Maternal Grandmother    Hypertension Maternal Grandfather    Hypertension Paternal Grandmother    Diabetes Paternal Grandmother    Hypertension Paternal Grandfather    Diabetes Son 12       type 1    Allergies  Allergen Reactions   Topamax [Topiramate]     "cant function"    Tramadol     Cant function with it    Gluten Meal Diarrhea and Nausea Only        Ondansetron Other (See Comments)    Helps her nausea, but makes HA worse    Current Outpatient Medications on File Prior to Visit  Medication Sig Dispense Refill   eszopiclone (LUNESTA) 2 MG TABS tablet Take 1 tablet (2 mg total) by mouth at bedtime as needed for sleep. Take about 30 minutes before bedtime 30 tablet 2   FLUoxetine (PROZAC) 20 MG capsule Take 1 capsule (20 mg total) by mouth daily. 90 capsule 0   FLUoxetine (PROZAC) 40 MG capsule Take 2 capsules (80 mg total) by mouth daily. 180 capsule 0   losartan (COZAAR) 50 MG tablet TAKE 1 TABLET (50 MG TOTAL) BY MOUTH DAILY. FOR BLOOD PRESSURE. 90 tablet 0   pantoprazole (PROTONIX) 40 MG tablet Take 1 tablet (40 mg total) by mouth daily. For heartburn. 90 tablet 1   cyclobenzaprine (FLEXERIL) 5 MG tablet Take one po qhs prn muscle spasm (Patient not taking: Reported on 07/15/2022) 30 tablet 0   ferrous sulfate 325 (65 FE) MG EC tablet Take 325 mg by mouth 3 (three) times daily with meals. (Patient not taking: Reported on 07/29/2022)      FLUoxetine (PROZAC) 40 MG capsule TAKE 1 CAPSULE (40 MG TOTAL) BY MOUTH DAILY. FOR ANXIETY AND DEPRESSION. (Patient not taking: Reported on 07/29/2022) 90 capsule 1   nitrofurantoin (MACRODANTIN) 100 MG capsule Take 1 capsule (100 mg total) by mouth daily. (Patient not taking: Reported on 07/29/2022) 30 capsule 11   polyethylene glycol-electrolytes (NULYTELY) 420 g solution Prepare according to package instructions. Starting at 5:00 PM: Drink one 8 oz glass of mixture every 15 minutes until you finish half of the jug. Five hours prior to procedure, drink 8 oz glass of mixture every 15 minutes until it is all gone. Make sure you do not drink anything 4  hours prior to your procedure. (Patient not taking: Reported on 07/15/2022) 4000 mL 0   QUEtiapine (SEROQUEL) 25 MG tablet Take 1 tablet (25 mg total) by mouth at bedtime. (Patient not taking: Reported on 07/29/2022) 30 tablet 1   [DISCONTINUED] atorvastatin (LIPITOR) 20 MG tablet Take 1 tablet (20 mg total) by mouth every evening. For cholesterol. 90 tablet 3   [DISCONTINUED] traZODone (DESYREL) 50 MG tablet      No current facility-administered medications on file prior to visit.    BP 120/82   Pulse 90   Temp (!) 97.5 F (36.4 C) (Temporal)   Ht 5' (1.524 m)   Wt 172 lb (78 kg)   LMP  (LMP Unknown)   SpO2 97%   BMI 33.59 kg/m  Objective:   Physical Exam Cardiovascular:     Rate and Rhythm: Normal rate and regular rhythm.  Pulmonary:     Effort: Pulmonary effort is normal.     Breath sounds: Normal breath sounds.  Musculoskeletal:     Cervical back: Neck supple.  Skin:    General: Skin is warm and dry.           Assessment & Plan:  Iron deficiency anemia, unspecified iron deficiency anemia type Assessment & Plan: Repeat iron studies, B12 and folate pending.   Orders: -     CBC -     IBC + Ferritin -     Vitamin B12 -     Folate  Prediabetes Assessment & Plan: Noted on labs from 2022. Repeat A1C pending.  Orders: -      Hemoglobin A1c  Chronic fatigue Assessment & Plan: Differentials include iron deficiency anemia, B12 deficiency, sleep apnea.  Thyroid studies WNL. No alarm signs on exam.  Recommended she reschedule her overdue endoscopy.  Repeat iron studies, B12, and folate ordered and pending.  Recommended she follow up with pulmonology regarding a sleep study.         Doreene Nest, NP

## 2022-07-29 NOTE — Assessment & Plan Note (Signed)
Repeat iron studies, B12 and folate pending.

## 2022-07-29 NOTE — Assessment & Plan Note (Signed)
Differentials include iron deficiency anemia, B12 deficiency, sleep apnea.  Thyroid studies WNL. No alarm signs on exam.  Recommended she reschedule her overdue endoscopy.  Repeat iron studies, B12, and folate ordered and pending.  Recommended she follow up with pulmonology regarding a sleep study.

## 2022-08-02 ENCOUNTER — Encounter: Payer: Self-pay | Admitting: Internal Medicine

## 2022-08-12 ENCOUNTER — Other Ambulatory Visit: Payer: Self-pay | Admitting: Psychiatry

## 2022-08-23 ENCOUNTER — Ambulatory Visit (HOSPITAL_COMMUNITY)
Admission: EM | Admit: 2022-08-23 | Discharge: 2022-08-23 | Disposition: A | Discharge status: Home / Self Care | Payer: 59 | Attending: Physician Assistant | Admitting: Physician Assistant

## 2022-08-23 ENCOUNTER — Other Ambulatory Visit: Payer: Self-pay

## 2022-08-23 ENCOUNTER — Encounter (HOSPITAL_COMMUNITY): Payer: Self-pay | Admitting: *Deleted

## 2022-08-23 ENCOUNTER — Ambulatory Visit (INDEPENDENT_AMBULATORY_CARE_PROVIDER_SITE_OTHER): Payer: 59

## 2022-08-23 DIAGNOSIS — M25512 Pain in left shoulder: Secondary | ICD-10-CM

## 2022-08-23 DIAGNOSIS — R053 Chronic cough: Secondary | ICD-10-CM

## 2022-08-23 DIAGNOSIS — M7542 Impingement syndrome of left shoulder: Secondary | ICD-10-CM | POA: Diagnosis not present

## 2022-08-23 MED ORDER — PREDNISONE 10 MG (21) PO TBPK: ORAL_TABLET | ORAL | 0 refills | Status: DC

## 2022-08-23 MED ORDER — METHOCARBAMOL 500 MG PO TABS: 500.0000 mg | ORAL_TABLET | Freq: Two times a day (BID) | ORAL | 0 refills | Status: DC

## 2022-08-23 NOTE — ED Triage Notes (Signed)
Pt reports Lt shoulder pain for one month. Pt has taken ibuprofen with out relief. Pt reports she does work that [employer] cause the pain.

## 2022-08-23 NOTE — Discharge Instructions (Signed)
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  This is likely causing your pain.  Please start prednisone taper as I am hopeful this will help with your pain as well as breathing.  Do not take NSAIDs with this medication including aspirin, ibuprofen/Advil, naproxen/Aleve.  Take methocarbamol up to twice a day.  This will make you sleepy so do not drive or drink alcohol while taking it.  You may benefit from physical therapy and I would recommend following up with orthopedics for further evaluation and management to arrange this or other interventions as appropriate.  If you have any worsening or changing symptoms please return for reevaluation.  Your chest x-ray was normal.  Follow-up with your primary care about your chronic cough.  As we discussed, prednisone may help if there is any inflammation in your lungs causing your symptoms.  If you develop any worsening or changing symptoms including change in character of cough, fever, shortness of breath, chest discomfort you should be seen immediately.

## 2022-08-23 NOTE — ED Provider Notes (Signed)
[facility]    CSN: [id number] Arrival date & time: 08/23/22  1030      History   Chief Complaint Chief Complaint  Patient presents with   Shoulder Pain    HPI [name] is a 57 y.o. female.   Patient presents today with a month-long history of worsening left shoulder pain.  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  She denies any known injury or increase in activity prior to symptom onset.  Denies previous injury or surgery involving her shoulder.  She is right-handed.  Denies any numbness or paresthesias in her arm.  She has tried ibuprofen, Tylenol, and B2 pelvis without improvement of symptoms.  She reports that pain is rated 9 on a 0-10 pain scale, localized to superior left shoulder with radiation into upper arm, described as sharp, worse with certain movements or activities, no alleviating factors identified.  She denies history of gout or rheumatoid arthritis.  Patient was noted to have rales on exam.  She does report that she has had a chronic cough.  She was seen by her primary care and had negative x-ray 01/2022.  She was treated for bronchitis at that point with azithromycin and started on higher dose of PPI in case as the risks is contributing.  She continues to have significant cough particularly at night.  Denies history of asthma, COPD, smoking.    Past Medical History:  Diagnosis Date   Anemia    BV (bacterial vaginosis) 11/27/2012   Celiac disease    Cough due to ACE inhibitor 04/25/2019   Depression    Essential hypertension    Family history of adverse reaction to anesthesia    MOM-HARD TIME WAKING UP   GERD (gastroesophageal reflux disease)    Migraine with visual aura    MIGRAINES   UTI (lower urinary tract infection)     Patient Active Problem List   Diagnosis Date Noted   Prediabetes 07/29/2022   Nausea and vomiting 04/21/2022    Neck stiffness 02/26/2022   Persistent cough for 3 weeks or longer 01/21/2022   Iron deficiency anemia 11/30/2021   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 ENDOSCOPY;  Service: Gastroenterology;  Laterality: N/A;   ESOPHAGOGASTRODUODENOSCOPY (EGD) WITH PROPOFOL N/A 05/11/2022   Procedure: ESOPHAGOGASTRODUODENOSCOPY (EGD) WITH PROPOFOL;  Surgeon: Wyline Mood, MD;  Location: [facility] ENDOSCOPY;  Service: Gastroenterology;  Laterality: N/A;   EXPLORATORY LAPAROTOMY     IUD REMOVAL  07/25/2017   Procedure: INTRAUTERINE DEVICE (IUD) REMOVAL;  Surgeon: Linzie Collin, MD;  Location: [facility] ORS;  Service: Gynecology;;   LAPAROSCOPIC ASSISTED VAGINAL HYSTERECTOMY Bilateral 07/25/2017   Procedure: LAPAROSCOPIC ASSISTED VAGINAL HYSTERECTOMY WITH BILATERAL SALPING OOPHERECTOMY;  Surgeon: Linzie Collin, MD;  Location: [facility] ORS;  Service: Gynecology;  Laterality: Bilateral;   TUBAL LIGATION  OB History     Gravida  3   Para  3   Term  3   Preterm      AB      Living  3      SAB      IAB      Ectopic      Multiple      Live Births  3            Home Medications    Prior to Admission medications   Medication Sig Start Date End Date Taking? Authorizing Provider  methocarbamol (ROBAXIN) 500 MG tablet Take 1 tablet (500 mg total) by mouth 2 (two) times daily. 08/23/22  Yes  Jolyn Deshmukh K, PA-C  predniSONE (STERAPRED UNI-PAK 21 TAB) 10 MG (21) TBPK tablet As directed 08/23/22  Yes Reyanna Baley K, PA-C  eszopiclone (LUNESTA) 2 MG TABS tablet Take 1 tablet (2 mg total) by mouth at bedtime as needed for sleep. Take about 30 minutes before bedtime 06/09/22   Olalere, Adewale A, MD  ferrous sulfate 325 (65 FE) MG EC tablet Take 325 mg by mouth 3 (three) times daily with meals. Patient not taking: Reported on 07/29/2022    [provider]  FLUoxetine (PROZAC) 20 MG capsule Take 1 capsule (20 mg total) by mouth daily. 07/15/22 10/13/22  Neysa Hotter, MD  FLUoxetine (PROZAC) 40 MG capsule Take 2 capsules (80 mg total) by mouth daily. 05/18/22 08/16/22  Neysa Hotter, MD  losartan (COZAAR) 50 MG tablet TAKE 1 TABLET (50 MG TOTAL) BY MOUTH DAILY. FOR BLOOD PRESSURE. 06/21/22   Doreene Nest, NP  pantoprazole (PROTONIX) 40 MG tablet Take 1 tablet (40 mg total) by mouth daily. For heartburn. 03/24/22   Doreene Nest, NP  QUEtiapine (SEROQUEL) 25 MG tablet Take 1 tablet (25 mg total) by mouth at bedtime. Patient not taking: Reported on 07/29/2022 07/15/22 09/13/22  Neysa Hotter, MD  atorvastatin (LIPITOR) 20 MG tablet Take 1 tablet (20 mg total) by mouth every evening. For cholesterol. 03/02/19 07/02/20  Doreene Nest, NP  traZODone (DESYREL) 50 MG tablet  08/15/19 07/02/20  [provider]    Family History Family History  Problem Relation Age of Onset   Hypertension Mother    Stroke Mother    Cancer Father    Hypertension Maternal Grandmother    Colon cancer Maternal Grandmother    Breast cancer Maternal Grandmother    Skin cancer Maternal Grandmother    Hypertension Maternal Grandfather    Hypertension Paternal Grandmother    Diabetes Paternal Grandmother    Hypertension Paternal Grandfather    Diabetes Son 75       type 1    Social History Social History   Tobacco Use   Smoking status: Never   Smokeless tobacco: Never  Vaping Use   Vaping  Use: Never used  Substance Use Topics   Alcohol use: No    Alcohol/week: 0.0 standard drinks of alcohol   Drug use: No     Allergies   Topamax [topiramate], Tramadol, Gluten meal, and Ondansetron   Review of Systems Review of Systems  Constitutional:  Positive for activity change. Negative for appetite change, fatigue and fever.  Respiratory:  Positive for cough. Negative for shortness of breath.   Cardiovascular:  Negative for chest pain.  Gastrointestinal:  Negative for abdominal pain, diarrhea, nausea and vomiting.  Musculoskeletal:  Positive for arthralgias and joint swelling. Negative for myalgias.  Skin:  Negative for color change  and wound.  Neurological:  Negative for weakness and numbness.     Physical Exam Triage Vital Signs ED Triage Vitals  Enc Vitals Group     BP 08/23/22 1211 130/89     Pulse Rate 08/23/22 1211 87     Resp 08/23/22 1211 18     Temp 08/23/22 1211 98.1 F (36.7 C)     Temp src --      SpO2 08/23/22 1211 92 %     Weight --      Height --      Head Circumference --      Peak Flow --      Pain Score 08/23/22 1206 9     Pain Loc --      Pain Edu? --      Excl. in GC? --    No data found.  Updated Vital Signs BP 130/89   Pulse 81   Temp 98.1 F (36.7 C)   Resp 18   LMP  (LMP Unknown)   SpO2 94%   Visual Acuity Right Eye Distance:   Left Eye Distance:   Bilateral Distance:    Right Eye Near:   Left Eye Near:    Bilateral Near:     Physical Exam Vitals reviewed.  Constitutional:      General: She is awake. She is not in acute distress.    Appearance: Normal appearance. She is well-developed. She is not ill-appearing.     Comments: Very pleasant female appears stated age in no acute distress sitting comfortably in exam room  HENT:     Head: Normocephalic and atraumatic.  Cardiovascular:     Rate and Rhythm: Normal rate and regular rhythm.     Heart sounds: Normal heart sounds, S1 normal and S2 normal. No murmur  heard. Pulmonary:     Effort: Pulmonary effort is normal.     Breath sounds: Examination of the right-lower field reveals rales. Examination of the left-lower field reveals rales. Rales present. No wheezing or rhonchi.  Musculoskeletal:     Left shoulder: Tenderness and bony tenderness present. No swelling, deformity or crepitus. Decreased range of motion. Normal strength.     Comments: Left shoulder: Significant tenderness palpation over AC joint and along humeral head.  Decreased range of motion with overhead flexion, abduction to 90 degrees, internal and external rotation.  Negative drop arm and empty can.  Strength 5/5.  Hand neurovascularly intact.  Psychiatric:        Behavior: Behavior is cooperative.      UC Treatments / Results  Labs (all labs ordered are listed, but only abnormal results are displayed) Labs Reviewed - No data to display  EKG   Radiology DG Shoulder Left  Result Date: 08/23/2022 CLINICAL DATA:  Left shoulder pain and decreased range of motion. EXAM: LEFT SHOULDER - 2+ VIEW COMPARISON:  None Available. FINDINGS: There is no evidence of fracture or dislocation. Mild spurring at the [facility] joint. There is no evidence of arthropathy of the glenohumeral joint. No focal bone abnormality. Soft tissues are unremarkable. IMPRESSION: Mild degenerative changes of the left AC joint.  No acute findings. Electronically Signed   By: Duanne Guess D.O.   On: 08/23/2022 12:56   DG Chest 2 View  Result Date: 08/23/2022 CLINICAL DATA:  Cough EXAM: CHEST - 2 VIEW COMPARISON:  01/21/2022 FINDINGS: The heart size and mediastinal contours are within normal limits. Both lungs are clear. The visualized skeletal structures are unremarkable. IMPRESSION: No active cardiopulmonary  disease. Electronically Signed   By: Duanne Guess D.O.   On: 08/23/2022 12:53    Procedures Procedures (including critical care time)  Medications Ordered in UC Medications - No data to display  Initial  Impression / Assessment and Plan / UC Course  I have reviewed the triage vital signs and the nursing notes.  Pertinent labs & imaging results that were available during my care of the patient were reviewed by me and considered in my medical decision making (see chart for details).     Patient is well-appearing, afebrile, nontoxic, nontachycardic.  X-ray was obtained of her shoulder given tenderness over [facility] joint which did show degenerative changes at the [facility] joint.  We discussed that this could be contributing to her symptoms and she likely has impingement syndrome.  She is already taken an not had improvement with over-the-counter NSAIDs so we will try prednisone taper.  Discussed that she is not to take NSAIDs with this medication due to risk of GI bleeding.  She was also prescribed Robaxin for additional pain relief and we discussed that this can be sedating and she is not to drive or drink alcohol while taking it.  She is to use heat and gentle stretch for symptom relief.  Recommended that she follow-up with orthopedics as she will likely need more advanced imaging and/or additional intervention such as physical therapy that we cannot arrange in urgent care.  She was given contact information for a local provider with instruction to call to schedule an appointment.  Discussed that if she has any worsening or changing symptoms she is to return for reevaluation.  Strict return precautions given.  Work excuse note provided.  Patient initially had oxygen saturation of 92% which is slightly decreased from her baseline of 96 to 97%.  On recheck this increased to 94%.  She had rales on exam and so chest x-ray was obtained that showed no acute cardiopulmonary abnormality.  Discussed that she should continue following up with her primary care for ongoing management of chronic cough.  We also discussed that prednisone should help if there is any inflammation in her lungs contributing to her symptoms.  Discussed  that if she has any worsening or changing symptoms she should return for reevaluation.  All questions answered to patient satisfaction.  Final Clinical Impressions(s) / UC Diagnoses   Final diagnoses:  Shoulder impingement syndrome, left  Acute pain of left shoulder  Chronic cough     Discharge Instructions      Your x-ray showed arthritis at your shoulder joint.  This is likely causing your pain.  Please start prednisone taper as I am hopeful this will help with your pain as well as breathing.  Do not take NSAIDs with this medication including aspirin, ibuprofen/Advil, naproxen/Aleve.  Take methocarbamol up to twice a day.  This will make you sleepy so do not drive or drink alcohol while taking it.  You may benefit from physical therapy and I would recommend following up with orthopedics for further evaluation and management to arrange this or other interventions as appropriate.  If you have any worsening or changing symptoms please return for reevaluation.  Your chest x-ray was normal.  Follow-up with your primary care about your chronic cough.  As we discussed, prednisone may help if there is any inflammation in your lungs causing your symptoms.  If you develop any worsening or changing symptoms including change in character of cough, fever, shortness of breath, chest discomfort you should be seen  immediately.     ED Prescriptions     Medication Sig Dispense Auth. Provider   predniSONE (STERAPRED UNI-PAK 21 TAB) 10 MG (21) TBPK tablet As directed 21 tablet Chandel Zaun K, PA-C   methocarbamol (ROBAXIN) 500 MG tablet Take 1 tablet (500 mg total) by mouth 2 (two) times daily. 20 tablet Tiannah Greenly, Noberto Retort, PA-C      PDMP not reviewed this encounter.   Jeani Hawking, PA-C 08/23/22 1311

## 2022-08-26 ENCOUNTER — Ambulatory Visit (INDEPENDENT_AMBULATORY_CARE_PROVIDER_SITE_OTHER): Payer: 59 | Admitting: Clinical

## 2022-08-26 ENCOUNTER — Ambulatory Visit (INDEPENDENT_AMBULATORY_CARE_PROVIDER_SITE_OTHER): Payer: 59 | Admitting: Orthopaedic Surgery

## 2022-08-26 DIAGNOSIS — F333 Major depressive disorder, recurrent, severe with psychotic symptoms: Secondary | ICD-10-CM

## 2022-08-26 DIAGNOSIS — G8929 Other chronic pain: Secondary | ICD-10-CM

## 2022-08-26 DIAGNOSIS — F419 Anxiety disorder, unspecified: Secondary | ICD-10-CM | POA: Diagnosis not present

## 2022-08-26 DIAGNOSIS — M25512 Pain in left shoulder: Secondary | ICD-10-CM | POA: Diagnosis not present

## 2022-08-26 MED ORDER — DICLOFENAC SODIUM 75 MG PO TBEC: 75.0000 mg | DELAYED_RELEASE_TABLET | Freq: Two times a day (BID) | ORAL | 2 refills | Status: DC

## 2022-08-26 NOTE — Progress Notes (Signed)
                Demorris Choyce, LCSW 

## 2022-08-26 NOTE — Progress Notes (Signed)
[facility] Counselor/Therapist Progress Note  Patient ID: [name], MRN: [id number],    Date: 08/26/2022  Time Spent: 8:36am - 9:16am : 40 minutes  Treatment Type: Individual Therapy  Reported Symptoms: Patient reported recent improvement in concentration and reported thought/belief that her husband is having an affair at times  Mental Status Exam: Appearance:  Well Groomed     Behavior: Appropriate  Motor: Normal  Speech/Language:  Clear and Coherent  Affect: Appropriate  Mood: normal  Thought process: normal  Thought content:   WNL  Sensory/Perceptual disturbances:   Patient denied current symptoms of psychosis  Orientation: oriented to person, place, and situation  Attention: Good  Concentration: Good  Memory: WNL  Fund of knowledge:  Good  Insight:   Fair  Judgment:  Fair  Impulse Control: Good   Risk Assessment: Danger to Self:  No Patient denied current suicidal ideation  Self-injurious Behavior: No Danger to Others: No Patient denied current homicidal ideation Duty to Warn:no Physical Aggression / Violence:No  Access to Firearms a concern: No  Gang Involvement:No   Subjective: Patient stated, "it seems like it's been going pretty good" in response to events since last session and reported she has returned to work. Patient reported her supervisor recently lost her son in a car accident. Patient stated, "what I have is petty" in response to her supervisor's loss, and reported her supervisor's experience has changed patient's perception.  Patient reported she continues to experience difficulty driving. Patient reported she has a follow up appointment with her psychiatrist on May 20th and plans to disclosing difficulty driving with provider. Patient stated, "everything seems to be going better" in regards to thoughts of her husband having an affair. Patient stated, "Ive decided I'm taking my life back" and stated, "I'm trying to push thoughts out of my  head". Patient stated, "I try to think of something else" when thoughts occur. Patient stated, "my mood's really been a lot better" and stated, "I feel good right now" in response to current mood. Patient reported thoughts of her  husband having an affair have decreased and patient reported no current thoughts. Patient reported entries in her thought record have been related to her grandchildren and stated, "they were good thoughts". Patient reported at this time she is not able to listen to music due to thought/belief that her husband is having an affair when hearing a song. Patent stated,  "I kind of put him in the position of the song". Patient reported she has no evidence to support her thought. Patient stated, "my insecurity of myself"  is a trigger for the thoughts associated with her husband.    Interventions: Cognitive Behavioral Therapy. Clinician conducted session in person at clinician's office at [facility]. Reviewed events since last session. Discussed recent changes in patient's thoughts/perception. Assessed current symptoms and frequency/intensity of cognitive distortions. Explored coping strategies patient has been implementing in response to thoughts of her husband having an affair. Reviewed patient's thought record. Provided psycho education related to cognitive distortions and cognitive restructuring. During session, clinician assisted patient in utilizing socratic questions to challenge cognitive distortions. Explored triggers for cognitive distortions. Provided psycho education related to symptoms of depression. Clinician requested patient continue thought record for homework and practice challenging negative thoughts that occur.   Collaboration of Care: Other not required at this time  Diagnosis:  Major Depressive Disorder, recurrent, severe with psychotic features   Anxiety Disorder Unspecified   Plan: Patient is to utilize Cognitive Behavioral Therapy, coping  strategies,  and mindfulness to decrease symptoms associated with Major Depressive Disorder.   Long-term goal:   Patient stated, "to be happier with myself" and not experience thoughts of husband being unfaithful as a long term goal.    Reduce overall frequency and intensity of anxiety and depressive symptoms as evidenced by decreased depressed mood, loss of interest, loss of motivation, anger, difficulty falling asleep and staying asleep, concentration, feeling overwhelmed, tremors when anxious, and delusions from 7 days per week to 0 to 1 days per week  Target Date: 02/26/23  Progress: progressing     Short-term goal:  Develop an understanding of the relationship between symptoms of depression and anxiety and the impact on patient's thoughts and behaviors  Target Date: 02/26/23  Progress: progressing   Identify, challenge, and re-frame cognitive distortions  Target Date: 02/26/23  Progress: progressing    Identify, challenge, and replace negative core beliefs/schemas with positive and empowering beliefs  Target Date: 02/26/23  Progress: progressing    Doree Barthel, LCSW

## 2022-08-26 NOTE — Progress Notes (Signed)
Office Visit Note   Patient: [name]           Date of Birth: 03/03/1966           MRN: [id number] Visit Date: 08/26/2022              Requested by: Doreene Nest, NP [address] [place],  [place] [address] PCP: Doreene Nest, NP   Assessment & Plan: Visit Diagnoses:  1. Chronic left shoulder pain     Plan: Impression is 57 year old female with chronic left shoulder pain probable overuse and inflammation.  I recommend work restrictions for 2 weeks.  She should finish out the prednisone and then take diclofenac for 2 weeks which I have sent in.  If symptoms persist patient should follow-up with me again.  Follow-Up Instructions: No follow-ups on file.   Orders:  No orders of the defined types were placed in this encounter.  Meds ordered this encounter  Medications   diclofenac (VOLTAREN) 75 MG EC tablet    Sig: Take 1 tablet (75 mg total) by mouth 2 (two) times daily.    Dispense:  30 tablet    Refill:  2      Procedures: No procedures performed   Clinical Data: No additional findings.   Subjective: Chief Complaint  Patient presents with   Left Shoulder - Pain    HPI Nayanna is a 57 year old female comes in for evaluation of left shoulder pain for a month.  Denies any injuries.  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  Denies any numbness and tingling or radicular symptoms.  She saw her PCP and just recently started a prednisone Dosepak. Review of Systems  Constitutional: Negative.   HENT: Negative.    Eyes: Negative.   Respiratory: Negative.    Cardiovascular: Negative.   Endocrine: Negative.   Musculoskeletal: Negative.   Neurological: Negative.   Hematological: Negative.   Psychiatric/Behavioral: Negative.    All other systems reviewed and are negative.    Objective: Vital Signs: LMP  (LMP Unknown)   Physical Exam Vitals and nursing note reviewed.  Constitutional:      Appearance:  She is well-developed.  HENT:     Head: Atraumatic.     Nose: Nose normal.  Eyes:     Extraocular Movements: Extraocular movements intact.  Cardiovascular:     Pulses: Normal pulses.  Pulmonary:     Effort: Pulmonary effort is normal.  Abdominal:     Palpations: Abdomen is soft.  Musculoskeletal:     Cervical back: Neck supple.  Skin:    General: Skin is warm.     Capillary Refill: Capillary refill takes less than 2 seconds.  Neurological:     Mental Status: She is alert. Mental status is at baseline.  Psychiatric:        Behavior: Behavior normal.        Thought Content: Thought content normal.        Judgment: Judgment normal.     Ortho Exam Examination left shoulder shows global pain with movement of the shoulder and with manual muscle testing and with provocative test.  No focal findings.  Negative Spurling's. Specialty Comments:  No specialty comments available.  Imaging: No results found.   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) 11/27/2021   Vitamin D deficiency 11/27/2021   Chronic fatigue 11/27/2021   Paranoid delusion (HCC) 11/27/2021   GERD (gastroesophageal reflux disease) 04/25/2019   Hyperlipidemia 03/02/2019   Essential hypertension 02/18/2019   Recurrent cystitis 11/27/2012   Migraine with visual aura 09/19/2011   Chronic constipation 06/25/2011   Past Medical History:  Diagnosis Date   Anemia    BV (bacterial vaginosis) 11/27/2012   Celiac disease    Cough due to ACE inhibitor 04/25/2019   Depression    Essential hypertension    Family history of adverse reaction to anesthesia    MOM-HARD TIME WAKING UP   GERD (gastroesophageal reflux disease)    Migraine with  visual aura    MIGRAINES   UTI (lower urinary tract infection)     Family History  Problem Relation Age of Onset   Hypertension Mother    Stroke Mother    Cancer Father    Hypertension Maternal Grandmother    Colon cancer Maternal Grandmother    Breast cancer Maternal Grandmother    Skin cancer Maternal Grandmother    Hypertension Maternal Grandfather    Hypertension Paternal Grandmother    Diabetes Paternal Grandmother    Hypertension Paternal Grandfather    Diabetes Son 12       type 1    Past Surgical History:  Procedure Laterality Date   ABDOMINAL HYSTERECTOMY     BLADDER SUSPENSION     COLONOSCOPY WITH PROPOFOL N/A 05/11/2022   Procedure: COLONOSCOPY WITH PROPOFOL;  Surgeon: Wyline Mood, MD;  Location: [facility] ENDOSCOPY;  Service: Gastroenterology;  Laterality: N/A;   ESOPHAGOGASTRODUODENOSCOPY (EGD) WITH PROPOFOL N/A 05/11/2022   Procedure: ESOPHAGOGASTRODUODENOSCOPY (EGD) WITH PROPOFOL;  Surgeon: Wyline Mood, MD;  Location: [facility] ENDOSCOPY;  Service: Gastroenterology;  Laterality: N/A;   EXPLORATORY LAPAROTOMY     IUD REMOVAL  07/25/2017   Procedure: INTRAUTERINE DEVICE (IUD) REMOVAL;  Surgeon: Linzie Collin, MD;  Location: [facility] ORS;  Service: Gynecology;;   LAPAROSCOPIC ASSISTED VAGINAL HYSTERECTOMY Bilateral 07/25/2017   Procedure: LAPAROSCOPIC ASSISTED VAGINAL HYSTERECTOMY WITH BILATERAL SALPING OOPHERECTOMY;  Surgeon: Linzie Collin, MD;  Location: [facility] ORS;  Service: Gynecology;  Laterality: Bilateral;   TUBAL LIGATION     Social History   Occupational History   Occupation: Corporate treasurer  Tobacco Use   Smoking status: Never   Smokeless tobacco: Never  Vaping Use   Vaping Use: Never used  Substance and Sexual Activity   Alcohol use: No    Alcohol/week: 0.0 standard drinks of alcohol   Drug use: No   Sexual activity: Yes    Partners: Female    Birth control/protection: Surgical    Comment: INTERCOURSE AGE 60, SEXUAL PARTNERS LEES THAN 5

## 2022-08-28 NOTE — Progress Notes (Deleted)
BH MD/PA/NP OP Progress Note  08/28/2022 3:57 PM [name]  MRN:  [id number]  Chief Complaint: No chief complaint on file.  HPI: ***    Substance use   Tobacco Alcohol Other substances/  Current denies denies denies  Past denies denies denies  Past Treatment           Household: husband, (son visits at times), friend Marital status: married in 1987 Number of children: 3  Employment: [employer], sewing [employer] (works there for 2.5 years) Education:  12 th grade  Last PCP / ongoing medical evaluation:  She describes her childhood as "easy." Her family got along well until later (her parents divorced). She reports good support from her parents  Visit Diagnosis: No diagnosis found.  Past Psychiatric History: Please see initial evaluation for full details. I have reviewed the history. No updates at this time.     Past Medical History:  Past Medical History:  Diagnosis Date   Anemia    BV (bacterial vaginosis) 11/27/2012   Celiac disease    Cough due to ACE inhibitor 04/25/2019   Depression    Essential hypertension    Family history of adverse reaction to anesthesia    MOM-HARD TIME WAKING UP   GERD (gastroesophageal reflux disease)    Migraine with visual aura    MIGRAINES   UTI (lower urinary tract infection)     Past Surgical History:  Procedure Laterality Date   ABDOMINAL HYSTERECTOMY     BLADDER SUSPENSION     COLONOSCOPY WITH PROPOFOL N/A 05/11/2022   Procedure: COLONOSCOPY WITH PROPOFOL;  Surgeon: Wyline Mood, MD;  Location: [facility] ENDOSCOPY;  Service: Gastroenterology;  Laterality: N/A;   ESOPHAGOGASTRODUODENOSCOPY (EGD) WITH PROPOFOL N/A 05/11/2022   Procedure: ESOPHAGOGASTRODUODENOSCOPY (EGD) WITH PROPOFOL;  Surgeon: Wyline Mood, MD;  Location: [facility] ENDOSCOPY;  Service: Gastroenterology;  Laterality: N/A;   EXPLORATORY LAPAROTOMY     IUD REMOVAL  07/25/2017   Procedure: INTRAUTERINE DEVICE (IUD) REMOVAL;  Surgeon: Linzie Collin, MD;   Location: [facility] ORS;  Service: Gynecology;;   LAPAROSCOPIC ASSISTED VAGINAL HYSTERECTOMY Bilateral 07/25/2017   Procedure: LAPAROSCOPIC ASSISTED VAGINAL HYSTERECTOMY WITH BILATERAL SALPING OOPHERECTOMY;  Surgeon: Linzie Collin, MD;  Location: [facility] ORS;  Service: Gynecology;  Laterality: Bilateral;   TUBAL LIGATION      Family Psychiatric History: Please see initial evaluation for full details. I have reviewed the history. No updates at this time.     Family History:  Family History  Problem Relation Age of Onset   Hypertension Mother    Stroke Mother    Cancer Father    Hypertension Maternal Grandmother    Colon cancer Maternal Grandmother    Breast cancer Maternal Grandmother    Skin cancer Maternal Grandmother    Hypertension Maternal Grandfather    Hypertension Paternal Grandmother    Diabetes Paternal Grandmother    Hypertension Paternal Grandfather    Diabetes Son 83       type 1    Social History:  Social History   Socioeconomic History   Marital status: Married    Spouse name: [name]   Number of children: 3   Years of education: 12   Highest education level: High school graduate  Occupational History   Occupation: Corporate treasurer  Tobacco Use   Smoking status: Never   Smokeless tobacco: Never  Vaping Use   Vaping Use: Never used  Substance and Sexual Activity   Alcohol use: No    Alcohol/week: 0.0  standard drinks of alcohol   Drug use: No   Sexual activity: Yes    Partners: Female    Birth control/protection: Surgical    Comment: INTERCOURSE AGE 65, SEXUAL PARTNERS LEES THAN 5  Other Topics Concern   Not on file  Social History Narrative   Lives with her husband and their three children, and her daughter's boyfriend.  Her older son's daughter lives there part-time as well.   Social Determinants of Health   Financial Resource Strain: Not on file  Food Insecurity: Not on file  Transportation Needs: Not on file  Physical Activity: Not  on file  Stress: Not on file  Social Connections: Not on file    Allergies:  Allergies  Allergen Reactions   Topamax [Topiramate]     "cant function"    Tramadol     Cant function with it    Gluten Meal Diarrhea and Nausea Only        Ondansetron Other (See Comments)    Helps her nausea, but makes HA worse    Metabolic Disorder Labs: Lab Results  Component Value Date   HGBA1C 5.8 07/29/2022   MPG 108.28 02/24/2019   Lab Results  Component Value Date   PROLACTIN 7.6 06/19/2014   Lab Results  Component Value Date   CHOL 201 (H) 09/09/2020   TRIG 138.0 09/09/2020   HDL 39.50 09/09/2020   CHOLHDL 5 09/09/2020   VLDL 27.6 09/09/2020   LDLCALC 134 (H) 09/09/2020   LDLCALC 65 08/29/2019   Lab Results  Component Value Date   TSH 3.01 02/18/2022   TSH 1.79 11/27/2021    Therapeutic Level Labs: No results found for: "LITHIUM" No results found for: "VALPROATE" No results found for: "CBMZ"  Current Medications: Current Outpatient Medications  Medication Sig Dispense Refill   diclofenac (VOLTAREN) 75 MG EC tablet Take 1 tablet (75 mg total) by mouth 2 (two) times daily. 30 tablet 2   eszopiclone (LUNESTA) 2 MG TABS tablet Take 1 tablet (2 mg total) by mouth at bedtime as needed for sleep. Take about 30 minutes before bedtime 30 tablet 2   ferrous sulfate 325 (65 FE) MG EC tablet Take 325 mg by mouth 3 (three) times daily with meals. (Patient not taking: Reported on 07/29/2022)     FLUoxetine (PROZAC) 20 MG capsule Take 1 capsule (20 mg total) by mouth daily. 90 capsule 0   FLUoxetine (PROZAC) 40 MG capsule Take 2 capsules (80 mg total) by mouth daily. 180 capsule 0   losartan (COZAAR) 50 MG tablet TAKE 1 TABLET (50 MG TOTAL) BY MOUTH DAILY. FOR BLOOD PRESSURE. 90 tablet 0   methocarbamol (ROBAXIN) 500 MG tablet Take 1 tablet (500 mg total) by mouth 2 (two) times daily. 20 tablet 0   pantoprazole (PROTONIX) 40 MG tablet Take 1 tablet (40 mg total) by mouth daily. For  heartburn. 90 tablet 1   predniSONE (STERAPRED UNI-PAK 21 TAB) 10 MG (21) TBPK tablet As directed 21 tablet 0   QUEtiapine (SEROQUEL) 25 MG tablet Take 1 tablet (25 mg total) by mouth at bedtime. (Patient not taking: Reported on 07/29/2022) 30 tablet 1   No current facility-administered medications for this visit.     Musculoskeletal: Strength & Muscle Tone: within normal limits Gait & Station: normal Patient leans: N/A  Psychiatric Specialty Exam: Review of Systems  There were no vitals taken for this visit.There is no height or weight on file to calculate BMI.  General Appearance: {Appearance:22683}  Eye Contact:  {  B[facility] EYE CONTACT:22684}  Speech:  Clear and Coherent  Volume:  Normal  Mood:  {BHH MOOD:22306}  Affect:  {Affect (PAA):22687}  Thought Process:  Coherent  Orientation:  Full (Time, Place, and Person)  Thought Content: Logical   Suicidal Thoughts:  {ST/HT (PAA):22692}  Homicidal Thoughts:  {ST/HT (PAA):22692}  Memory:  Immediate;   Good  Judgement:  {Judgement (PAA):22694}  Insight:  {Insight (PAA):22695}  Psychomotor Activity:  Normal  Concentration:  Concentration: Good and Attention Span: Good  Recall:  Good  Fund of Knowledge: Good  Language: Good  Akathisia:  No  Handed:  Right  AIMS (if indicated): not done  Assets:  Communication Skills Desire for Improvement  ADL's:  Intact  Cognition: WNL  Sleep:  {B[facility] GOOD/FAIR/POOR:22877}   Screenings: GAD-7    Flowsheet Row Office Visit from 07/29/2022 in [facility] [place] Office Visit from [facility] Office Visit from 02/25/2022 in [facility] [place] Office Visit from 02/04/2022 in [facility]  Total GAD-7 Score 13 15 10 20       PHQ2-9    Flowsheet Row Office Visit from 07/29/2022 in [facility] [place] Office Visit from 07/15/2022 in  [facility] Office Visit from 05/18/2022 in [facility] Office Visit from 02/25/2022 in [facility] [place] Office Visit from 02/04/2022 in [facility]  PHQ-2 Total Score 5 5 4 4 5   PHQ-9 Total Score 17 16 21 15 21       Flowsheet Row Office Visit from 07/15/2022 in [facility] Office Visit from 05/18/2022 in [facility] Admission (Discharged) from 05/11/2022 in [facility]  C-SSRS RISK CATEGORY Error: Q3, 4, or 5 should not be populated when Q2 is No Error: Q3, 4, or 5 should not be populated when Q2 is No No Risk        Assessment and Plan:  [name] is a 57 y.o. year old female with a history of depression, hypertension, hyperlipidemia, iron deficiency anemia, who is referred for depression.    1. Obsessive-compulsive disorder, unspecified type 2. MDD (major depressive disorder), recurrent episode, moderate Acute stressors include:being sued by her niece.   Other stressors include: son (legally blind, age 48)     History: worsening in obsessive thoughts since hysterectomy in 2019  She continues to report ego-dystonic obsessive thoughts about infidelity of her husband, depressive symptoms and anxiety despite uptitration of fluoxetine.  Will lower the dose due to its limited efficacy from higher dose.  Will start quetiapine as adjunctive treatment for depression and also to target anxiety, insomnia.  Discussed potential metabolic side effect, EPS and drowsiness.      # Insomnia She reports snoring, middle insomnia.  Referral was made for evaluation of sleep apnea, and she was started on Lunesta by the provider.  She has another upcoming appointment; hopefully she will get home sleep test.    Plan Decrease fluoxetine 60 mg daily  (limited benefit from 80 mg) Start quetiapine 25 mg at night  Next appointment: 5/20 at 3:30 for 30 mins, in person   The patient demonstrates the following risk factors for suicide: Chronic risk factors for suicide include: psychiatric disorder of depression . Acute risk factors for suicide include: N/A. Protective factors for this patient include:  positive social support and hope for the future. Considering these factors, the overall suicide risk at this point appears to be low. Patient is appropriate for outpatient follow up. Emergency resources which includes 911, ED, suicide crisis line [phone]) are discussed.      Collaboration of Care: Collaboration of Care: {BH OP Collaboration of Care:21014065}  Patient/Guardian was advised Release of Information must be obtained prior to any record release in order to collaborate their care with an outside provider. Patient/Guardian was advised if they have not already done so to contact the registration department to sign all necessary forms in order for [name] to release information regarding their care.   Consent: Patient/Guardian gives verbal consent for treatment and assignment of benefits for services provided during this visit. Patient/Guardian expressed understanding and agreed to proceed.    Neysa Hotter, MD 08/28/2022, 3:57 PM

## 2022-08-30 ENCOUNTER — Inpatient Hospital Stay (HOSPITAL_BASED_OUTPATIENT_CLINIC_OR_DEPARTMENT_OTHER): Payer: 59 | Admitting: Internal Medicine

## 2022-08-30 ENCOUNTER — Inpatient Hospital Stay: Payer: 59 | Attending: Internal Medicine

## 2022-08-30 ENCOUNTER — Ambulatory Visit: Payer: 59 | Admitting: Psychiatry

## 2022-08-30 ENCOUNTER — Encounter: Payer: Self-pay | Admitting: Internal Medicine

## 2022-08-30 VITALS — BP 128/83 | HR 81 | Temp 98.3°F | Wt 171.6 lb

## 2022-08-30 DIAGNOSIS — Z809 Family history of malignant neoplasm, unspecified: Secondary | ICD-10-CM | POA: Insufficient documentation

## 2022-08-30 DIAGNOSIS — D509 Iron deficiency anemia, unspecified: Secondary | ICD-10-CM

## 2022-08-30 DIAGNOSIS — Z808 Family history of malignant neoplasm of other organs or systems: Secondary | ICD-10-CM | POA: Diagnosis not present

## 2022-08-30 DIAGNOSIS — Z803 Family history of malignant neoplasm of breast: Secondary | ICD-10-CM | POA: Insufficient documentation

## 2022-08-30 LAB — CBC WITH DIFFERENTIAL/PLATELET
Abs Immature Granulocytes: 0.03 10*3/uL (ref 0.00–0.07)
Basophils Absolute: 0 10*3/uL (ref 0.0–0.1)
Basophils Relative: 1 %
Eosinophils Absolute: 0.3 10*3/uL (ref 0.0–0.5)
Eosinophils Relative: 5 %
HCT: 41.4 % (ref 36.0–46.0)
Hemoglobin: 13.3 g/dL (ref 12.0–15.0)
Immature Granulocytes: 1 %
Lymphocytes Relative: 21 %
Lymphs Abs: 1.2 10*3/uL (ref 0.7–4.0)
MCH: 30.2 pg (ref 26.0–34.0)
MCHC: 32.1 g/dL (ref 30.0–36.0)
MCV: 93.9 fL (ref 80.0–100.0)
Monocytes Absolute: 0.5 10*3/uL (ref 0.1–1.0)
Monocytes Relative: 7 %
Neutro Abs: 4 10*3/uL (ref 1.7–7.7)
Neutrophils Relative %: 65 %
Platelets: 290 10*3/uL (ref 150–400)
RBC: 4.41 MIL/uL (ref 3.87–5.11)
RDW: 14 % (ref 11.5–15.5)
WBC: 6.1 10*3/uL (ref 4.0–10.5)
nRBC: 0 % (ref 0.0–0.2)

## 2022-08-30 LAB — IRON AND TIBC
Iron: 53 ug/dL (ref 28–170)
Saturation Ratios: 15 % (ref 10.4–31.8)
TIBC: 364 ug/dL (ref 250–450)
UIBC: 311 ug/dL

## 2022-08-30 LAB — FERRITIN: Ferritin: 14 ng/mL (ref 11–307)

## 2022-08-30 NOTE — Progress Notes (Signed)
[facility]  [phone]) [phone] [phone]  ID: Berna Bue OB: [birth year]  MR#: [id number]  AOZ#:[id number]  Patient Care Team: Doreene Nest, NP as PCP - General (Internal Medicine)  REFERRING PROVIDER: Vernona Rieger, NP  REASON FOR REFERRAL: IDA  HPI: [name] is a 57 y.o. female with past medical history of celiac disease, depression, hypertension, GERD, headaches was referred to hematology for management of iron deficiency anemia.  Patient reports that she was seen by PCP in August 2023 when she was having depression issues.  At that time iron panel was checked which was consistent with iron deficiency.  She was started on oral iron.  Labs were repeated in 3 weeks which showed normal iron panel and patient discontinued oral iron.  Since she had a history of iron deficiency, she had labs rechecked on 04/21/2022. Ferritin of 3.  Saturation 8%.  CBC showed hemoglobin 11.4.  WBC and platelets were normal.  CMP was largely unremarkable.  Vitamin B12 324.  TSH normal.  Patient denies any bleeding in stools or urine.  Denies any melanotic stools.  Uses NSAIDs occasionally.    Colonoscopy done by Dr. Tobi Bastos on 05/11/2022 showed multiple diverticulosis.  Repeat in 10 years was recommended.  Upper endoscopy showed large hiatal hernia, LA grade 3 esophagitis with no bleeding in the lower third of the esophagus.  Was advised omeprazole 40 mg twice daily for 3 months.  Repeat endoscopy in 2 months to assess response to treatment.  Completed IV Venofer 200 mg x 4 doses on 06/10/2022.  Tolerated well.  Reported history of celiac disease.  Celiac panel was checked but came back negative.  Duodenal biopsies also came back negative.  Interval history Patient was seen today as follow-up for labs. She completed iron infusions in February 2024.  Did report some improvement in energy level.  Denies any bleeding in urine or stool.  Completed endoscopy and colonoscopy  with Dr. Tobi Bastos.  Could not undergo repeat endoscopy to assess healing of esophagitis.  Has multiple family responsibilities and constraints.  Was recently in ED for left shoulder pain due to repetitive action from her job.  Was provided Medrol Dosepak and short course of NSAIDs.  Was seen by orthopedics.  Now on Robaxin.  Has some improvement in pain but not resolved.  Continues with her job.   REVIEW OF SYSTEMS:   ROS  As per HPI. Otherwise, a complete review of systems is negative.  PAST MEDICAL HISTORY: Past Medical History:  Diagnosis Date   Anemia    BV (bacterial vaginosis) 11/27/2012   Celiac disease    Cough due to ACE inhibitor 04/25/2019   Depression    Essential hypertension    Family history of adverse reaction to anesthesia    MOM-HARD TIME WAKING UP   GERD (gastroesophageal reflux disease)    Migraine with visual aura    MIGRAINES   UTI (lower urinary tract infection)     PAST SURGICAL HISTORY: Past Surgical History:  Procedure Laterality Date   ABDOMINAL HYSTERECTOMY     BLADDER SUSPENSION     COLONOSCOPY WITH PROPOFOL N/A 05/11/2022   Procedure: COLONOSCOPY WITH PROPOFOL;  Surgeon: Wyline Mood, MD;  Location: [facility] ENDOSCOPY;  Service: Gastroenterology;  Laterality: N/A;   ESOPHAGOGASTRODUODENOSCOPY (EGD) WITH PROPOFOL N/A 05/11/2022   Procedure: ESOPHAGOGASTRODUODENOSCOPY (EGD) WITH PROPOFOL;  Surgeon: Wyline Mood, MD;  Location: [facility] ENDOSCOPY;  Service: Gastroenterology;  Laterality: N/A;   EXPLORATORY LAPAROTOMY     IUD REMOVAL  07/25/2017  Procedure: INTRAUTERINE DEVICE (IUD) REMOVAL;  Surgeon: Linzie Collin, MD;  Location: [facility] ORS;  Service: Gynecology;;   LAPAROSCOPIC ASSISTED VAGINAL HYSTERECTOMY Bilateral 07/25/2017   Procedure: LAPAROSCOPIC ASSISTED VAGINAL HYSTERECTOMY WITH BILATERAL SALPING OOPHERECTOMY;  Surgeon: Linzie Collin, MD;  Location: [facility] ORS;  Service: Gynecology;  Laterality: Bilateral;   TUBAL LIGATION      FAMILY  HISTORY: Family History  Problem Relation Age of Onset   Hypertension Mother    Stroke Mother    Cancer Father    Hypertension Maternal Grandmother    Colon cancer Maternal Grandmother    Breast cancer Maternal Grandmother    Skin cancer Maternal Grandmother    Hypertension Maternal Grandfather    Hypertension Paternal Grandmother    Diabetes Paternal Grandmother    Hypertension Paternal Grandfather    Diabetes Son 63       type 1    HEALTH MAINTENANCE: Social History   Tobacco Use   Smoking status: Never   Smokeless tobacco: Never  Vaping Use   Vaping Use: Never used  Substance Use Topics   Alcohol use: No    Alcohol/week: 0.0 standard drinks of alcohol   Drug use: No     Allergies  Allergen Reactions   Topamax [Topiramate]     "cant function"    Tramadol     Cant function with it    Gluten Meal Diarrhea and Nausea Only        Ondansetron Other (See Comments)    Helps her nausea, but makes HA worse    Current Outpatient Medications  Medication Sig Dispense Refill   diclofenac (VOLTAREN) 75 MG EC tablet Take 1 tablet (75 mg total) by mouth 2 (two) times daily. 30 tablet 2   eszopiclone (LUNESTA) 2 MG TABS tablet Take 1 tablet (2 mg total) by mouth at bedtime as needed for sleep. Take about 30 minutes before bedtime 30 tablet 2   FLUoxetine (PROZAC) 20 MG capsule Take 1 capsule (20 mg total) by mouth daily. 90 capsule 0   FLUoxetine (PROZAC) 40 MG capsule Take 2 capsules (80 mg total) by mouth daily. 180 capsule 0   losartan (COZAAR) 50 MG tablet TAKE 1 TABLET (50 MG TOTAL) BY MOUTH DAILY. FOR BLOOD PRESSURE. 90 tablet 0   methocarbamol (ROBAXIN) 500 MG tablet Take 1 tablet (500 mg total) by mouth 2 (two) times daily. 20 tablet 0   pantoprazole (PROTONIX) 40 MG tablet Take 1 tablet (40 mg total) by mouth daily. For heartburn. 90 tablet 1   predniSONE (STERAPRED UNI-PAK 21 TAB) 10 MG (21) TBPK tablet As directed 21 tablet 0   QUEtiapine (SEROQUEL) 25 MG tablet  Take 1 tablet (25 mg total) by mouth at bedtime. 30 tablet 1   ferrous sulfate 325 (65 FE) MG EC tablet Take 325 mg by mouth 3 (three) times daily with meals. (Patient not taking: Reported on 07/29/2022)     No current facility-administered medications for this visit.    OBJECTIVE: Vitals:   08/30/22 0943  BP: 128/83  Pulse: 81  Temp: 98.3 F (36.8 C)  SpO2: 96%     Body mass index is 33.51 kg/m.      General: Well-developed, well-nourished, no acute distress. Eyes: Pink conjunctiva, anicteric sclera. HEENT: Normocephalic, moist mucous membranes, clear oropharnyx. Lungs: Clear to auscultation bilaterally. Heart: Regular rate and rhythm. No rubs, murmurs, or gallops. Abdomen: Soft, nontender, nondistended. No organomegaly noted, normoactive bowel sounds. Musculoskeletal: No edema, cyanosis, or clubbing. Neuro: Alert, answering  all questions appropriately. Cranial nerves grossly intact. Skin: No rashes or petechiae noted. Psych: Normal affect. Lymphatics: No cervical, calvicular, axillary or inguinal LAD.   LAB RESULTS:  Lab Results  Component Value Date   NA 138 02/18/2022   K 4.2 02/18/2022   CL 104 02/18/2022   CO2 28 02/18/2022   GLUCOSE 95 02/18/2022   BUN 19 02/18/2022   CREATININE 0.75 02/18/2022   CALCIUM 9.0 02/18/2022   PROT 6.6 02/18/2022   ALBUMIN 4.0 02/18/2022   AST 14 02/18/2022   ALT 12 02/18/2022   ALKPHOS 77 02/18/2022   BILITOT 0.2 02/18/2022   GFRNONAA >60 09/04/2020   GFRAA >60 04/09/2019    Lab Results  Component Value Date   WBC 6.1 08/30/2022   NEUTROABS 4.0 08/30/2022   HGB 13.3 08/30/2022   HCT 41.4 08/30/2022   MCV 93.9 08/30/2022   PLT 290 08/30/2022    Lab Results  Component Value Date   TIBC 364 08/30/2022   TIBC 352.8 07/29/2022   TIBC 361 05/06/2022   FERRITIN 14 08/30/2022   FERRITIN 20.3 07/29/2022   FERRITIN 25 05/06/2022   IRONPCTSAT 15 08/30/2022   IRONPCTSAT 16.2 (L) 07/29/2022   IRONPCTSAT 52 05/06/2022      STUDIES: DG Shoulder Left  Result Date: 08/23/2022 CLINICAL DATA:  Left shoulder pain and decreased range of motion. EXAM: LEFT SHOULDER - 2+ VIEW COMPARISON:  None Available. FINDINGS: There is no evidence of fracture or dislocation. Mild spurring at the [facility] joint. There is no evidence of arthropathy of the glenohumeral joint. No focal bone abnormality. Soft tissues are unremarkable. IMPRESSION: Mild degenerative changes of the left AC joint.  No acute findings. Electronically Signed   By: Duanne Guess D.O.   On: 08/23/2022 12:56   DG Chest 2 View  Result Date: 08/23/2022 CLINICAL DATA:  Cough EXAM: CHEST - 2 VIEW COMPARISON:  01/21/2022 FINDINGS: The heart size and mediastinal contours are within normal limits. Both lungs are clear. The visualized skeletal structures are unremarkable. IMPRESSION: No active cardiopulmonary disease. Electronically Signed   By: Duanne Guess D.O.   On: 08/23/2022 12:53    ASSESSMENT AND PLAN:   [name] is a 57 y.o. female with pmh of celiac disease, depression, hypertension, GERD, headaches was referred to hematology for management of iron deficiency anemia.  # Iron deficiency anemia -Completed IV Venofer 200 mg x 4 doses in February 2024.  Hemoglobin normalized.  Iron panel pending.  -Colonoscopy done by Dr. Tobi Bastos on 05/11/2022 showed multiple diverticulosis.  Repeat in 10 years was recommended.  Upper endoscopy showed large hiatal hernia, LA grade 3 esophagitis with no bleeding in the lower third of the esophagus.  Was advised omeprazole 40 mg twice daily for 3 months.  Repeat endoscopy in 2 months was advised to assess response to treatment.  Could not have repeat endoscopy due to family commitments. -Celiac panel was negative.   Orders Placed This Encounter  Procedures   CBC with Differential/Platelet   Iron and TIBC   Ferritin   Vitamin B12   RTC in 6 months for MD visit, labs  Patient expressed understanding and was in agreement with  this plan. She also understands that She can call clinic at any time with any questions, concerns, or complaints.   I spent a total of 25 minutes reviewing chart data, face-to-face evaluation with the patient, counseling and coordination of care as detailed above.  Michaelyn Barter, MD   08/30/2022 11:33 AM

## 2022-09-15 ENCOUNTER — Other Ambulatory Visit: Payer: Self-pay | Admitting: Psychiatry

## 2022-09-16 ENCOUNTER — Ambulatory Visit: Payer: 59 | Admitting: Clinical

## 2022-09-16 DIAGNOSIS — F333 Major depressive disorder, recurrent, severe with psychotic symptoms: Secondary | ICD-10-CM

## 2022-09-16 NOTE — Progress Notes (Signed)
[facility] Counselor/Therapist Progress Note  Patient ID: [name], MRN: [id number],    Date: 09/16/2022  Time Spent: 12:35pm - 1:23pm : 48 minutes   Treatment Type: Individual Therapy  Reported Symptoms: none reported at time of session  Mental Status Exam: Appearance:  Neat and Well Groomed     Behavior: Appropriate  Motor: Normal  Speech/Language:  Clear and Coherent  Affect: Appropriate  Mood: normal  Thought process: normal  Thought content:   WNL  Sensory/Perceptual disturbances:   WNL  Orientation: oriented to person, place, time/date, and situation  Attention: Good  Concentration: Good  Memory: WNL  Fund of knowledge:  Good  Insight:   Fair  Judgment:  Fair  Impulse Control: Good   Risk Assessment: Danger to Self:  No Patient denied current suicidal ideation  Self-injurious Behavior: No Danger to Others: No Patient denied current homicidal ideation Duty to Warn:no Physical Aggression / Violence:No  Access to Firearms a concern: No  Gang Involvement:No   Subjective: Patient stated, "pretty good" in response to events since last session. Patient stated, "I still have a moment here and there but not like it was".  Patient reported she has been utilizing challenging questions in response to cognitive distortions/negative thoughts. Patient reported she has identified more positive answers when challenging cognitive distortions/negative thoughts and stated, "its helped a lot". Patient stated,  "I don't seem to be trying to get as many negative thoughts as I did". Patient reported she has been trying to listen to music a little at a time. Patient reported she has been trying to focus on the melody of the song versus the words of the song. Patient stated, "Its helps a lot just to do the melody versus the words" and reported she tries to tune the words out. Patient reported recently a song was playing and patient reported she was focusing on her husband's eyes  and the direction he was looking in during the song. Patient reported she challenged the thoughts that occurred when the song was playing. Patient stated, "[name] been feeling pretty good emotionally". Patient reported when she experiences improvement in mood she noted the thoughts of her husband having an affair are less. Patient reported a history of feeling everyone was talking about patient in 2018 and reported her belief that her husband was having an affair started during that time.   Interventions: Cognitive Behavioral Therapy. Clinician conducted session in person at clinician's office at [facility]. Reviewed events since last session. Assessed intensity and frequency of cognitive distortions/negative thoughts. Discussed patient's implementation of challenging cognitive distortions/negative thoughts, assessed efficacy of cognitive restructuring, and ways in which challenging cognitive distortions/negative thoughts have impacted patient's daily life.  Discussed additional coping strategies patient has implemented when listening to music. Reviewed patient's homework. Assessed patient's mood. Provided psycho education related to symptoms of depression and psychotic symptoms. Clinician requested patient continue thought record for homework and practice challenging negative thoughts that occur.    Collaboration of Care: Other Discussed a consent for patient's psychiatrist, Dr. Vanetta Shawl, at [facility] and patient agreed to complete a consent for Dr. Vanetta Shawl.    Diagnosis:  Major Depressive Disorder, recurrent, severe with psychotic features   Anxiety Disorder Unspecified   Plan: Patient is to utilize Cognitive Behavioral Therapy, coping strategies, and mindfulness to decrease symptoms associated with Major Depressive Disorder.   Long-term goal:   Patient stated, "to be happier with myself" and not experience thoughts of husband being unfaithful as a  long term goal.     Reduce overall frequency and intensity of anxiety and depressive symptoms as evidenced by decreased depressed mood, loss of interest, loss of motivation, anger, difficulty falling asleep and staying asleep, concentration, feeling overwhelmed, tremors when anxious, and delusions from 7 days per week to 0 to 1 days per week  Target Date: 02/26/23  Progress: progressing      Short-term goal:  Develop an understanding of the relationship between symptoms of depression and anxiety and the impact on patient's thoughts and behaviors  Target Date: 02/26/23  Progress: progressing    Identify, challenge, and re-frame cognitive distortions  Target Date: 02/26/23  Progress: progressing    Identify, challenge, and replace negative core beliefs/schemas with positive and empowering beliefs  Target Date: 02/26/23  Progress: progressing     Doree Barthel, LCSW

## 2022-09-16 NOTE — Progress Notes (Signed)
                Humberto Addo, LCSW 

## 2022-10-19 ENCOUNTER — Ambulatory Visit: Payer: 59 | Admitting: Clinical

## 2022-10-19 ENCOUNTER — Encounter: Payer: Self-pay | Admitting: Primary Care

## 2022-10-19 DIAGNOSIS — F419 Anxiety disorder, unspecified: Secondary | ICD-10-CM | POA: Diagnosis not present

## 2022-10-19 DIAGNOSIS — F333 Major depressive disorder, recurrent, severe with psychotic symptoms: Secondary | ICD-10-CM

## 2022-10-19 NOTE — Progress Notes (Signed)
Behavioral Health Counselor/Therapist Progress Note  Patient ID: [name], MRN: [id number],    Date: 10/19/2022  Time Spent: 2:41pm - 3:24pm : 43 minutes   Treatment Type: Individual Therapy  Reported Symptoms: none reported during today's session  Mental Status Exam: Appearance:  Neat and Well Groomed     Behavior: Appropriate  Motor: Normal  Speech/Language:  Clear and Coherent  Affect: Appropriate  Mood: normal  Thought process: normal  Thought content:   WNL  Sensory/Perceptual disturbances:   WNL  Orientation: oriented to person, place, and situation  Attention: Good  Concentration: Good  Memory: WNL  Fund of knowledge:  Good  Insight:   Fair  Judgment:  Fair  Impulse Control: Good   Risk Assessment: Danger to Self:  No Patient denied current suicidal ideation  Self-injurious Behavior: No Danger to Others: No Patient denied current homicidal ideation Duty to Warn:no Physical Aggression / Violence:No  Access to Firearms a concern: No  Gang Involvement:No   Subjective: Patient stated, "pretty good" in response to events since last session. Patient stated, "my mood's been pretty good". Patient stated, "I'm able to overcome it by asking myself questions" and stated, "It's getting better" in regards to cognitive distortions.  Patient reported her mother in law passed away on 2024-07-09th and patient stated, "that's been a little rough". Patient stated, "we had to love them from a distance a little bit". Patient stated, "it hurt me" in response to the loss of her mother in law. Patient reported there was a conflict with patient's husband's siblings regarding his mother's belongings. Patient stated, "my husband's trying to keep the peace" and stated, "I'm hanging in there" in response to the loss of patient's mother in law. Patient stated, "she's not suffering anymore". Patient reported her mother in law had been ill for several years and her mother in law's health had  deteriorated significantly in recent years. Patient reported during her mother in law's illness patient/family were able to reconnect with family members patient/family had not seen in years. Patient reported her spiritual beliefs are a source of strength for patient. Patient stated, "when I fall back I have more thoughts" and stated,  "I have to keep praying and going forward". Patient reported she continues to maintain a thought record and challenge thought distortions/negative thoughts. Patient stated, "it's good" in response to patient's mood today.   Interventions: Cognitive Behavioral Therapy and supportive therapy.  Clinician conducted session in person at clinician's office at [facility]. Reviewed events since last session. Assessed patient's mood since last session. Assessed frequency of cognitive distortions/negative thoughts since last session and reviewed the efficacy of patient challenging cognitive distortions. Provided supportive therapy, active and reflective listening, and validation as patient discussed the recent loss of her mother in law. Provided psycho education related to grief. Explored and identified coping strengths patient utilizes in response to grief and other stressors. Reviewed patient's thought record. Clinician requested patient continue thought record for homework and practice challenging negative thoughts that occur.    Collaboration of Care: not required at this time  Diagnosis:  Major Depressive Disorder, recurrent, severe with psychotic features   Anxiety Disorder Unspecified   Plan: Patient is to utilize Cognitive Behavioral Therapy, coping strategies, and mindfulness to decrease symptoms associated with Major Depressive Disorder.   Long-term goal:   Patient stated, "to be happier with myself" and not experience thoughts of husband being unfaithful as a long term goal.    Reduce overall frequency and  intensity of anxiety and depressive symptoms as  evidenced by decreased depressed mood, loss of interest, loss of motivation, anger, difficulty falling asleep and staying asleep, concentration, feeling overwhelmed, tremors when anxious, and delusions from 7 days per week to 0 to 1 days per week  Target Date: 02/26/23  Progress: progressing      Short-term goal:  Develop an understanding of the relationship between symptoms of depression and anxiety and the impact on patient's thoughts and behaviors  Target Date: 02/26/23  Progress: progressing    Identify, challenge, and re-frame cognitive distortions  Target Date: 02/26/23  Progress: progressing    Identify, challenge, and replace negative core beliefs/schemas with positive and empowering beliefs  Target Date: 02/26/23  Progress: progressing       Doree Barthel, LCSW

## 2022-10-19 NOTE — Progress Notes (Signed)
                Camyah Pultz, LCSW 

## 2022-10-31 ENCOUNTER — Encounter (HOSPITAL_COMMUNITY): Payer: Self-pay

## 2022-10-31 ENCOUNTER — Ambulatory Visit (HOSPITAL_COMMUNITY)
Admission: EM | Admit: 2022-10-31 | Discharge: 2022-10-31 | Disposition: A | Discharge status: Home / Self Care | Payer: 59 | Attending: Internal Medicine | Admitting: Internal Medicine

## 2022-10-31 DIAGNOSIS — K047 Periapical abscess without sinus: Secondary | ICD-10-CM | POA: Diagnosis not present

## 2022-10-31 MED ORDER — AMOXICILLIN-POT CLAVULANATE 875-125 MG PO TABS: 1.0000 | ORAL_TABLET | Freq: Two times a day (BID) | ORAL | 0 refills | Status: DC

## 2022-10-31 MED ORDER — IBUPROFEN 600 MG PO TABS: 600.0000 mg | ORAL_TABLET | Freq: Four times a day (QID) | ORAL | 0 refills | Status: DC | PRN

## 2022-10-31 NOTE — ED Provider Notes (Signed)
[facility]    CSN: [id number] Arrival date & time: 10/31/22  1230      History   Chief Complaint No chief complaint on file.   HPI [name] is a 57 y.o. female.   Patient presents to urgent care for evaluation of dental pain to the left posterior mouth that started a couple of days ago.  States the area feels very swollen.  She has not been able to visualize any dental abscesses or drainage.  No ear pain, dizziness, sore throat, rash, abdominal pain, nausea, vomiting, fever/chills, or decreased oral intake.  She takes Macrobid daily for prevention of urinary tract infections (prescribed by urology).  Otherwise no recent antibiotic use or history of immunosuppression.  She has a Education officer, community and plans on calling them tomorrow morning to schedule an appointment for follow-up.  No allergies to antibiotics.  No recent trauma or injury to the mouth.     Past Medical History:  Diagnosis Date   Anemia    BV (bacterial vaginosis) 11/27/2012   Celiac disease    Cough due to ACE inhibitor 04/25/2019   Depression    Essential hypertension    Family history of adverse reaction to anesthesia    MOM-HARD TIME WAKING UP   GERD (gastroesophageal reflux disease)    Migraine with visual aura    MIGRAINES   UTI (lower urinary tract infection)     Patient Active Problem List   Diagnosis Date Noted   Prediabetes 07/29/2022   Nausea and vomiting 04/21/2022   Neck stiffness 02/26/2022   Persistent cough for 3 weeks or longer 01/21/2022   Iron deficiency anemia 11/30/2021   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) 11/27/2021   Vitamin D deficiency 11/27/2021   Chronic fatigue 11/27/2021   Paranoid delusion (HCC) 11/27/2021   GERD (gastroesophageal reflux disease) 04/25/2019   Hyperlipidemia 03/02/2019   Essential hypertension 02/18/2019   Recurrent cystitis 11/27/2012   Migraine with visual aura 09/19/2011   Chronic constipation  06/25/2011    Past Surgical History:  Procedure Laterality Date   ABDOMINAL HYSTERECTOMY     BLADDER SUSPENSION     COLONOSCOPY WITH PROPOFOL N/A 05/11/2022   Procedure: COLONOSCOPY WITH PROPOFOL;  Surgeon: Wyline Mood, MD;  Location: [facility] ENDOSCOPY;  Service: Gastroenterology;  Laterality: N/A;   ESOPHAGOGASTRODUODENOSCOPY (EGD) WITH PROPOFOL N/A 05/11/2022   Procedure: ESOPHAGOGASTRODUODENOSCOPY (EGD) WITH PROPOFOL;  Surgeon: Wyline Mood, MD;  Location: [facility] ENDOSCOPY;  Service: Gastroenterology;  Laterality: N/A;   EXPLORATORY LAPAROTOMY     IUD REMOVAL  07/25/2017   Procedure: INTRAUTERINE DEVICE (IUD) REMOVAL;  Surgeon: Linzie Collin, MD;  Location: [facility] ORS;  Service: Gynecology;;   LAPAROSCOPIC ASSISTED VAGINAL HYSTERECTOMY Bilateral 07/25/2017   Procedure: LAPAROSCOPIC ASSISTED VAGINAL HYSTERECTOMY WITH BILATERAL SALPING OOPHERECTOMY;  Surgeon: Linzie Collin, MD;  Location: [facility] ORS;  Service: Gynecology;  Laterality: Bilateral;   TUBAL LIGATION      OB History     Gravida  3   Para  3   Term  3   Preterm      AB      Living  3      SAB      IAB      Ectopic      Multiple      Live Births  3            Home Medications    Prior to Admission medications   Medication Sig Start  Date End Date Taking? Authorizing Provider  amoxicillin-clavulanate (AUGMENTIN) 875-125 MG tablet Take 1 tablet by mouth every 12 (twelve) hours. 10/31/22  Yes Carlisle Beers, FNP  diclofenac (VOLTAREN) 75 MG EC tablet Take 1 tablet (75 mg total) by mouth 2 (two) times daily. 08/26/22  Yes Tarry Kos, MD  eszopiclone (LUNESTA) 2 MG TABS tablet Take 1 tablet (2 mg total) by mouth at bedtime as needed for sleep. Take about 30 minutes before bedtime 06/09/22  Yes Olalere, Adewale A, MD  ibuprofen (ADVIL) 600 MG tablet Take 1 tablet (600 mg total) by mouth every 6 (six) hours as needed. 10/31/22  Yes Carlisle Beers, FNP  losartan (COZAAR) 50 MG tablet TAKE 1  TABLET (50 MG TOTAL) BY MOUTH DAILY. FOR BLOOD PRESSURE. 06/21/22  Yes Doreene Nest, NP  pantoprazole (PROTONIX) 40 MG tablet Take 1 tablet (40 mg total) by mouth daily. For heartburn. 03/24/22  Yes Doreene Nest, NP  ferrous sulfate 325 (65 FE) MG EC tablet Take 325 mg by mouth 3 (three) times daily with meals. Patient not taking: Reported on 07/29/2022    [provider]  FLUoxetine (PROZAC) 20 MG capsule Take 1 capsule (20 mg total) by mouth daily. 07/15/22 10/13/22  Neysa Hotter, MD  FLUoxetine (PROZAC) 40 MG capsule Take 2 capsules (80 mg total) by mouth daily. 05/18/22 08/30/22  Neysa Hotter, MD  methocarbamol (ROBAXIN) 500 MG tablet Take 1 tablet (500 mg total) by mouth 2 (two) times daily. 08/23/22   Raspet, Noberto Retort, PA-C  predniSONE (STERAPRED UNI-PAK 21 TAB) 10 MG (21) TBPK tablet As directed 08/23/22   Raspet, Erin K, PA-C  QUEtiapine (SEROQUEL) 25 MG tablet Take 1 tablet (25 mg total) by mouth at bedtime. 07/15/22 09/13/22  Neysa Hotter, MD  atorvastatin (LIPITOR) 20 MG tablet Take 1 tablet (20 mg total) by mouth every evening. For cholesterol. 03/02/19 07/02/20  Doreene Nest, NP  traZODone (DESYREL) 50 MG tablet  08/15/19 07/02/20  [provider]    Family History Family History  Problem Relation Age of Onset   Hypertension Mother    Stroke Mother    Cancer Father    Hypertension Maternal Grandmother    Colon cancer Maternal Grandmother    Breast cancer Maternal Grandmother    Skin cancer Maternal Grandmother    Hypertension Maternal Grandfather    Hypertension Paternal Grandmother    Diabetes Paternal Grandmother    Hypertension Paternal Grandfather    Diabetes Son 108       type 1    Social History Social History   Tobacco Use   Smoking status: Never   Smokeless tobacco: Never  Vaping Use   Vaping status: Never Used  Substance Use Topics   Alcohol use: No    Alcohol/week: 0.0 standard drinks of alcohol   Drug use: No     Allergies    Topamax [topiramate], Tramadol, Gluten meal, and Ondansetron   Review of Systems Review of Systems Per HPI  Physical Exam Triage Vital Signs ED Triage Vitals [10/31/22 1302]  Encounter Vitals Group     BP (!) 148/85     Systolic BP Percentile      Diastolic BP Percentile      Pulse Rate (!) 102     Resp 16     Temp (!) 97.4 F (36.3 C)     Temp Source Oral     SpO2 98 %     Weight  Height      Head Circumference      Peak Flow      Pain Score      Pain Loc      Pain Education      Exclude from Growth Chart    No data found.  Updated Vital Signs BP (!) 148/85 (BP Location: Left Arm)   Pulse (!) 102   Temp (!) 97.4 F (36.3 C) (Oral)   Resp 16   LMP  (LMP Unknown)   SpO2 98%   Visual Acuity Right Eye Distance:   Left Eye Distance:   Bilateral Distance:    Right Eye Near:   Left Eye Near:    Bilateral Near:     Physical Exam Vitals and nursing note reviewed.  Constitutional:      Appearance: She is not ill-appearing or toxic-appearing.  HENT:     Head: Normocephalic and atraumatic.     Right Ear: Hearing and external ear normal.     Left Ear: Hearing and external ear normal.     Nose: Nose normal.     Mouth/Throat:     Lips: Pink.     Mouth: Mucous membranes are moist. No injury.     Dentition: Abnormal dentition. Dental tenderness, gingival swelling and dental caries present. No dental abscesses.     Tongue: No lesions. Tongue does not deviate from midline.     Palate: No mass and lesions.     Pharynx: Oropharynx is clear. Uvula midline. No pharyngeal swelling, oropharyngeal exudate, posterior oropharyngeal erythema or uvula swelling.     Tonsils: No tonsillar exudate or tonsillar abscesses.   Eyes:     General: Lids are normal. Vision grossly intact. Gaze aligned appropriately.     Extraocular Movements: Extraocular movements intact.     Conjunctiva/sclera: Conjunctivae normal.  Pulmonary:     Effort: Pulmonary effort is normal.   Musculoskeletal:     Cervical back: Neck supple.  Skin:    General: Skin is warm and dry.     Capillary Refill: Capillary refill takes less than 2 seconds.     Findings: No rash.  Neurological:     General: No focal deficit present.     Mental Status: She is alert and oriented to person, place, and time. Mental status is at baseline.     Cranial Nerves: No dysarthria or facial asymmetry.  Psychiatric:        Mood and Affect: Mood normal.        Speech: Speech normal.        Behavior: Behavior normal.        Thought Content: Thought content normal.        Judgment: Judgment normal.      UC Treatments / Results  Labs (all labs ordered are listed, but only abnormal results are displayed) Labs Reviewed - No data to display  EKG   Radiology No results found.  Procedures Procedures (including critical care time)  Medications Ordered in UC Medications - No data to display  Initial Impression / Assessment and Plan / UC Course  I have reviewed the triage vital signs and the nursing notes.  Pertinent labs & imaging results that were available during my care of the patient were reviewed by me and considered in my medical decision making (see chart for details).   1. Dental infection Evaluation suggests dental pain secondary to dental infection.  HEENT exam stable and without red flag signs indicating need for advanced imaging/further emergent  workup. Will manage this with augmentin antibiotic and over the counter medications as needed for pain and inflammation/swelling.  Patient is afebrile, nontoxic in appearance, and with hemodynamically stable vital signs.  Information for low cost community dental resources provided.  Encouraged to follow-up with dentist for further management.   Counseled patient on potential for adverse effects with medications prescribed/recommended today, strict ER and return-to-clinic precautions discussed, patient verbalized understanding.     Final Clinical Impressions(s) / UC Diagnoses   Final diagnoses:  Dental infection     Discharge Instructions      Your dental pain is likely due to dental infection. Take  antibiotic as prescribed for the next 7 days to treat your dental infection. Continue use of ibuprofen as needed with food for dental inflammation and pain.   You may also use tylenol as needed for pain. Perform salt water gargles every 3-4 hours.  Schedule an appointment with your dentist for soon as possible.  If you develop any new or worsening symptoms or do not improve in the next 2 to 3 days, please return.  If your symptoms are severe, please go to the emergency room.  Follow-up with your primary care provider for further evaluation and management of your symptoms as well as ongoing wellness visits.   Happy early birthday!!!! :)      ED Prescriptions     Medication Sig Dispense Auth. Provider   amoxicillin-clavulanate (AUGMENTIN) 875-125 MG tablet Take 1 tablet by mouth every 12 (twelve) hours. 14 tablet Reita May M, FNP   ibuprofen (ADVIL) 600 MG tablet Take 1 tablet (600 mg total) by mouth every 6 (six) hours as needed. 30 tablet Carlisle Beers, FNP      PDMP not reviewed this encounter.   Carlisle Beers, [place] 10/31/22 1331

## 2022-10-31 NOTE — Discharge Instructions (Addendum)
Your dental pain is likely due to dental infection. Take  antibiotic as prescribed for the next 7 days to treat your dental infection. Continue use of ibuprofen as needed with food for dental inflammation and pain.   You may also use tylenol as needed for pain. Perform salt water gargles every 3-4 hours.  Schedule an appointment with your dentist for soon as possible.  If you develop any new or worsening symptoms or do not improve in the next 2 to 3 days, please return.  If your symptoms are severe, please go to the emergency room.  Follow-up with your primary care provider for further evaluation and management of your symptoms as well as ongoing wellness visits.   Happy early birthday!!!! :)

## 2022-10-31 NOTE — ED Triage Notes (Signed)
Pt presents to the office for dental pain and swelling.

## 2022-11-09 ENCOUNTER — Other Ambulatory Visit: Payer: Self-pay

## 2022-11-09 ENCOUNTER — Ambulatory Visit: Payer: 59 | Admitting: Orthopaedic Surgery

## 2022-11-09 DIAGNOSIS — M25512 Pain in left shoulder: Secondary | ICD-10-CM

## 2022-11-09 DIAGNOSIS — G8929 Other chronic pain: Secondary | ICD-10-CM | POA: Diagnosis not present

## 2022-11-09 NOTE — Progress Notes (Signed)
Office Visit Note   Patient: [name]           Date of Birth: [birth year]           MRN: [id number] Visit Date: 11/09/2022              Requested by: Doreene Nest, NP [address] [place],  [place] [address] PCP: Doreene Nest, NP   Assessment & Plan: Visit Diagnoses:  1. Chronic left shoulder pain     Plan: Patient is a 57 year old female with continued left shoulder pain.  Oral anti-inflammatories were not effective.  Based on lack of improvement from conservative measures I have recommended an MR arthrogram of the left shoulder as well as an intra-articular cortisone injection.  We will send her to Dr. Shon Baton for this.  Follow-up after the MRI.  Follow-Up Instructions: No follow-ups on file.   Orders:  No orders of the defined types were placed in this encounter.  No orders of the defined types were placed in this encounter.     Procedures: No procedures performed   Clinical Data: No additional findings.   Subjective: Chief Complaint  Patient presents with   Left Shoulder - Pain    HPI [name] returns today for chronic left shoulder pain.  She did not feel much improvement from the medications. Review of Systems   Objective: Vital Signs: LMP  (LMP Unknown)   Physical Exam  Ortho Exam Examination left shoulder shows diffuse pain of the shoulder with range of motion in all planes.  She has crepitus and tenderness to the [facility] joint.  She has pain with manual muscle testing of the rotator cuff. Specialty Comments:  No specialty comments available.  Imaging: No results found.   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) 11/27/2021   Vitamin D deficiency  11/27/2021   Chronic fatigue 11/27/2021   Paranoid delusion (HCC) 11/27/2021   GERD (gastroesophageal reflux disease) 04/25/2019   Hyperlipidemia 03/02/2019   Essential hypertension 02/18/2019   Recurrent cystitis 11/27/2012   Migraine with visual aura 09/19/2011   Chronic constipation 06/25/2011   Past Medical History:  Diagnosis Date   Anemia    BV (bacterial vaginosis) 11/27/2012   Celiac disease    Cough due to ACE inhibitor 04/25/2019   Depression    Essential hypertension    Family history of adverse reaction to anesthesia    MOM-HARD TIME WAKING UP   GERD (gastroesophageal reflux disease)    Migraine with visual aura    MIGRAINES   UTI (lower urinary tract infection)     Family History  Problem Relation Age of Onset   Hypertension Mother    Stroke Mother    Cancer Father    Hypertension Maternal Grandmother    Colon cancer Maternal Grandmother    Breast cancer Maternal Grandmother    Skin cancer Maternal Grandmother    Hypertension Maternal Grandfather    Hypertension Paternal Grandmother    Diabetes Paternal Grandmother    Hypertension Paternal Grandfather    Diabetes Son 12       type 1    Past Surgical History:  Procedure Laterality Date   ABDOMINAL HYSTERECTOMY     BLADDER  SUSPENSION     COLONOSCOPY WITH PROPOFOL N/A 05/11/2022   Procedure: COLONOSCOPY WITH PROPOFOL;  Surgeon: Wyline Mood, MD;  Location: [facility] ENDOSCOPY;  Service: Gastroenterology;  Laterality: N/A;   ESOPHAGOGASTRODUODENOSCOPY (EGD) WITH PROPOFOL N/A 05/11/2022   Procedure: ESOPHAGOGASTRODUODENOSCOPY (EGD) WITH PROPOFOL;  Surgeon: Wyline Mood, MD;  Location: [facility] ENDOSCOPY;  Service: Gastroenterology;  Laterality: N/A;   EXPLORATORY LAPAROTOMY     IUD REMOVAL  07/25/2017   Procedure: INTRAUTERINE DEVICE (IUD) REMOVAL;  Surgeon: Linzie Collin, MD;  Location: [facility] ORS;  Service: Gynecology;;   LAPAROSCOPIC ASSISTED VAGINAL HYSTERECTOMY Bilateral 07/25/2017   Procedure: LAPAROSCOPIC  ASSISTED VAGINAL HYSTERECTOMY WITH BILATERAL SALPING OOPHERECTOMY;  Surgeon: Linzie Collin, MD;  Location: [facility] ORS;  Service: Gynecology;  Laterality: Bilateral;   TUBAL LIGATION     Social History   Occupational History   Occupation: Corporate treasurer  Tobacco Use   Smoking status: Never   Smokeless tobacco: Never  Vaping Use   Vaping status: Never Used  Substance and Sexual Activity   Alcohol use: No    Alcohol/week: 0.0 standard drinks of alcohol   Drug use: No   Sexual activity: Yes    Partners: Female    Birth control/protection: Surgical    Comment: INTERCOURSE AGE 13, SEXUAL PARTNERS LEES THAN 6

## 2022-11-17 NOTE — Telephone Encounter (Signed)
Message left to call and schedule appointment 

## 2022-11-17 NOTE — Telephone Encounter (Signed)
The refill has been ordered as requested. Please contact the patient to schedule a follow-up visit.

## 2022-11-19 ENCOUNTER — Ambulatory Visit: Payer: 59 | Admitting: Sports Medicine

## 2022-11-19 ENCOUNTER — Encounter: Payer: Self-pay | Admitting: Sports Medicine

## 2022-11-19 ENCOUNTER — Other Ambulatory Visit: Payer: Self-pay

## 2022-11-19 DIAGNOSIS — G8929 Other chronic pain: Secondary | ICD-10-CM

## 2022-11-19 DIAGNOSIS — M25512 Pain in left shoulder: Secondary | ICD-10-CM | POA: Diagnosis not present

## 2022-11-19 MED ORDER — LIDOCAINE HCL 1 % IJ SOLN
2.0000 mL | INTRAMUSCULAR | Status: AC | PRN
Start: 2022-11-19 — End: 2022-11-19
  Administered 2022-11-19: 2 mL

## 2022-11-19 MED ORDER — METHYLPREDNISOLONE ACETATE 40 MG/ML IJ SUSP
80.0000 mg | INTRAMUSCULAR | Status: AC | PRN
Start: 2022-11-19 — End: 2022-11-19
  Administered 2022-11-19: 80 mg via INTRA_ARTICULAR

## 2022-11-19 MED ORDER — BUPIVACAINE HCL 0.25 % IJ SOLN
2.0000 mL | INTRAMUSCULAR | Status: AC | PRN
  Administered 2022-11-19: 2 mL via INTRA_ARTICULAR

## 2022-11-19 NOTE — Progress Notes (Signed)
   Procedure Note  Patient: [name]             Date of Birth: 1965-09-14           MRN: [id number]             Visit Date: 11/19/2022  Procedures: Visit Diagnoses:  1. Chronic left shoulder pain    Large Joint Inj: L glenohumeral on 11/19/2022 3:20 PM Indications: pain Details: 22 G 3.5 in needle, ultrasound-guided posterior approach Medications: 2 mL lidocaine 1 %; 2 mL bupivacaine 0.25 %; 80 mg methylPREDNISolone acetate 40 MG/ML Outcome: tolerated well, no immediate complications  US-guided glenohumeral joint injection, left shoulder After discussion on risks/benefits/indications, informed verbal consent was obtained. A timeout was then performed. The patient was positioned lying lateral recumbent on examination table. The patient's shoulder was prepped with betadine and multiple alcohol swabs and utilizing ultrasound guidance, the patient's glenohumeral joint was identified on ultrasound. Using ultrasound guidance a 22-gauge, 3.5 inch needle with a mixture of 2:2:2 cc's lidocaine:bupivicaine:depomedrol was directed from a lateral to medial direction via in-plane technique into the glenohumeral joint with visualization of appropriate spread of injectate into the joint. Patient tolerated the procedure well without immediate complications.      Procedure, treatment alternatives, risks and benefits explained, specific risks discussed. Consent was given by the patient. Immediately prior to procedure a time out was called to verify the correct patient, procedure, equipment, support staff and site/side marked as required. Patient was prepped and draped in the usual sterile fashion.     - I evaluated the patient about 5 minutes post-injection and she was doing well - follow-up with Dr. Roda Shutters as indicated; I am happy to see them as needed - MRI shoulder scheduled for 12/20/22  Madelyn Brunner, DO Primary Care Sports Medicine Physician  [facility]  This note was  dictated using Dragon naturally speaking software and may contain errors in syntax, spelling, or content which have not been identified prior to signing this note.

## 2022-11-24 ENCOUNTER — Ambulatory Visit: Payer: 59 | Admitting: Clinical

## 2022-11-24 DIAGNOSIS — F419 Anxiety disorder, unspecified: Secondary | ICD-10-CM

## 2022-11-24 DIAGNOSIS — F333 Major depressive disorder, recurrent, severe with psychotic symptoms: Secondary | ICD-10-CM

## 2022-11-24 NOTE — Progress Notes (Signed)
                Karen Sharpe, LCSW 

## 2022-11-24 NOTE — Progress Notes (Signed)
Behavioral Health Counselor/Therapist Progress Note  Patient ID: [name], MRN: [id number],    Date: 11/24/2022  Time Spent: 2:33pm - 3:16pm : 43 minutes   Treatment Type: Individual Therapy  Reported Symptoms: Patient reported she experiences intrusive thoughts and anxiety in response to music  Mental Status Exam: Appearance:  Neat and Well Groomed     Behavior: Appropriate  Motor: Normal  Speech/Language:  Clear and Coherent  Affect: Appropriate  Mood: normal  Thought process: normal  Thought content:   WNL  Sensory/Perceptual disturbances:   WNL  Orientation: oriented to person, place, and situation  Attention: Good  Concentration: Good  Memory: WNL  Fund of knowledge:  Good  Insight:   Fair  Judgment:  Good  Impulse Control: Good   Risk Assessment: Danger to Self:  No Patient denied current suicidal ideation  Self-injurious Behavior: No Danger to Others: No Patient denied current homicidal ideation Duty to Warn:no Physical Aggression / Violence:No  Access to Firearms a concern: No  Gang Involvement:No   Subjective: Patient stated, "pretty good" in response to events since last session. Patient stated, "things are getting better". Patient reported she has been caring for her granddaughter on the weekends and stated, "my mind is focused on her" when caring for her granddaughter. Patient reported interactions with the individual living with patient and her family is a stressor at times and reported the individual is dependent on patient/patient's family. Patient reported she feels the individual is depressed and stated,  "it draws [name] down". Patient reported she goes into another room, goes outside, or goes for a drive when she feels roommate's mood is impacting patient's mood. Patient stated, "it's like he don't care about anything" and reported roommate will not pick up his groceries or assist with household chores. Patient reported patient and patient's family  provide transportation for roommate. Patient stated, "everything else has been going pretty good". Patient reported she continues to experience thoughts of her husband having an affair when hearing songs about relationships.  Patient stated, "I really don't have them as much" in regards to intrusive thoughts. Patient reported she experiences chest pain when intrusive thoughts occur in response to music. Patient reported there is no evidence to support her thought that her husband is having an affair. Patient reported thoughts started in approximately 2017 when she was driving husband to/from work. Patient reported she forgot to bring her thought record to today's session. Patient stated, "it's pretty good today" in response to mood today.  ' Interventions: Cognitive Behavioral Therapy and supportive therapy . Clinician conducted session in person at clinician's office at [facility]. Reviewed events since last session. Provided supportive therapy as patient discussed stress related to individual living with patient and her family. Provided psycho education related to depression. Explored and identified coping strategies patient utilizes when she feels roommate's mood is impacting patient's mood. Explored and identified thoughts associated with music. Challenged thought distortions and explored evidence for/against thought. Reviewed patient's thought record. Provided psycho education related to mindfulness exercise leaves on a stream and deep breathing. Assessed patient's current mood. Clinician requested patient continue thought record for homework and practice challenging negative thoughts that occur and mindfulness exercise.    Collaboration of Care: not required at this time   Diagnosis:  Major Depressive Disorder, recurrent, severe with psychotic features   Anxiety Disorder Unspecified   Plan: Patient is to utilize Cognitive Behavioral Therapy, coping strategies, and mindfulness to decrease  symptoms associated with Major Depressive Disorder.  Long-term goal:   Patient stated, "to be happier with myself" and not experience thoughts of husband being unfaithful as a long term goal.    Reduce overall frequency and intensity of anxiety and depressive symptoms as evidenced by decreased depressed mood, loss of interest, loss of motivation, anger, difficulty falling asleep and staying asleep, concentration, feeling overwhelmed, tremors when anxious, and delusions from 7 days per week to 0 to 1 days per week  Target Date: 02/26/23  Progress: progressing      Short-term goal:  Develop an understanding of the relationship between symptoms of depression and anxiety and the impact on patient's thoughts and behaviors  Target Date: 02/26/23  Progress: progressing    Identify, challenge, and re-frame cognitive distortions  Target Date: 02/26/23  Progress: progressing    Identify, challenge, and replace negative core beliefs/schemas with positive and empowering beliefs  Target Date: 02/26/23  Progress: progressing    Doree Barthel, LCSW

## 2022-11-28 ENCOUNTER — Other Ambulatory Visit: Payer: Self-pay

## 2022-11-28 ENCOUNTER — Emergency Department: Payer: 59

## 2022-11-28 ENCOUNTER — Emergency Department
Admission: EM | Admit: 2022-11-28 | Discharge: 2022-11-28 | Disposition: A | Discharge status: Home / Self Care | Payer: 59 | Attending: Emergency Medicine | Admitting: Emergency Medicine

## 2022-11-28 DIAGNOSIS — I10 Essential (primary) hypertension: Secondary | ICD-10-CM | POA: Insufficient documentation

## 2022-11-28 DIAGNOSIS — R531 Weakness: Secondary | ICD-10-CM | POA: Diagnosis not present

## 2022-11-28 DIAGNOSIS — R791 Abnormal coagulation profile: Secondary | ICD-10-CM | POA: Diagnosis not present

## 2022-11-28 DIAGNOSIS — R4781 Slurred speech: Secondary | ICD-10-CM | POA: Diagnosis present

## 2022-11-28 LAB — COMPREHENSIVE METABOLIC PANEL
ALT: 16 U/L (ref 0–44)
AST: 23 U/L (ref 15–41)
Albumin: 3.6 g/dL (ref 3.5–5.0)
Alkaline Phosphatase: 94 U/L (ref 38–126)
Anion gap: 7 (ref 5–15)
BUN: 18 mg/dL (ref 6–20)
CO2: 25 mmol/L (ref 22–32)
Calcium: 9.1 mg/dL (ref 8.9–10.3)
Chloride: 108 mmol/L (ref 98–111)
Creatinine, Ser: 0.66 mg/dL (ref 0.44–1.00)
GFR, Estimated: >60 mL/min (ref >60)
Glucose, Bld: 146 mg/dL — ABNORMAL HIGH (ref 70–99)
Potassium: 3.8 mmol/L (ref 3.5–5.1)
Sodium: 140 mmol/L (ref 135–145)
Total Bilirubin: 0.6 mg/dL (ref 0.3–1.2)
Total Protein: 6.9 g/dL (ref 6.5–8.1)

## 2022-11-28 LAB — CBC
HCT: 35.1 % — ABNORMAL LOW (ref 36.0–46.0)
Hemoglobin: 10.5 g/dL — ABNORMAL LOW (ref 12.0–15.0)
MCH: 26.2 pg (ref 26.0–34.0)
MCHC: 29.9 g/dL — ABNORMAL LOW (ref 30.0–36.0)
MCV: 87.5 fL (ref 80.0–100.0)
Platelets: 361 10*3/uL (ref 150–400)
RBC: 4.01 MIL/uL (ref 3.87–5.11)
RDW: 14 % (ref 11.5–15.5)
WBC: 3.9 10*3/uL — ABNORMAL LOW (ref 4.0–10.5)
nRBC: 0 % (ref 0.0–0.2)

## 2022-11-28 LAB — DIFFERENTIAL
Abs Immature Granulocytes: 0.01 10*3/uL (ref 0.00–0.07)
Basophils Absolute: 0 10*3/uL (ref 0.0–0.1)
Basophils Relative: 1 %
Eosinophils Absolute: 0.2 10*3/uL (ref 0.0–0.5)
Eosinophils Relative: 4 %
Immature Granulocytes: 0 %
Lymphocytes Relative: 23 %
Lymphs Abs: 0.9 10*3/uL (ref 0.7–4.0)
Monocytes Absolute: 0.3 10*3/uL (ref 0.1–1.0)
Monocytes Relative: 8 %
Neutro Abs: 2.5 10*3/uL (ref 1.7–7.7)
Neutrophils Relative %: 64 %

## 2022-11-28 LAB — PROTIME-INR
INR: 1 (ref 0.8–1.2)
Prothrombin Time: 13 seconds (ref 11.4–15.2)

## 2022-11-28 LAB — APTT: aPTT: 25 seconds (ref 24–36)

## 2022-11-28 LAB — ETHANOL: Alcohol, Ethyl (B): <10 mg/dL (ref <10)

## 2022-11-28 LAB — CBG MONITORING, ED: Glucose-Capillary: 158 mg/dL — ABNORMAL HIGH (ref 70–99)

## 2022-11-28 NOTE — ED Triage Notes (Signed)
Pt to ED with mother. Pt had sudden onset of of confusion and could not get words out and speech was slurred 2 hours ago at 1430. This lasted about 30 minutes. Pt states she feels drowsy and L foot still feels tingly. No sensation difference.   NIH is 0 in triage. CBG is 158. No blood thinners.

## 2022-11-28 NOTE — ED Provider Notes (Signed)
[facility] Provider Note    Event Date/Time   First MD Initiated Contact with Patient 11/28/22 1828     (approximate)   History   Chief Complaint strokelike symptoms (Expressive aphasia, dysarthria, confusion lasting 30 min at 1430)   HPI  [name] is a 57 y.o. female with a past medical history of hypertension, hyperlipidemia, iron deficiency anemia, GERD, migraines who presents to the ED for strokelike symptoms.  Patient reports that around 230 while she was driving, she suddenly began to feel like there was a "fog over me."  She describes feeling like "I was coming out of my body" and had some slurred speech.  She states that her left leg was tingling and felt like she could not keep it still.  She denies any vision changes or focal weakness.  She states that she has had similar episodes in the past but has not seen a doctor for it.  She denies any dizziness or lightheadedness, did not have any chest pain or shortness of breath.  Symptoms lasted for about 30 minutes before resolving.     Physical Exam   Triage Vital Signs: ED Triage Vitals  Encounter Vitals Group     BP 11/28/22 1632 121/72     Systolic BP Percentile --      Diastolic BP Percentile --      Pulse Rate 11/28/22 1632 82     Resp 11/28/22 1632 16     Temp 11/28/22 1632 98.2 F (36.8 C)     Temp Source 11/28/22 1632 Oral     SpO2 11/28/22 1632 97 %     Weight 11/28/22 1634 175 lb (79.4 kg)     Height 11/28/22 1634 5' (1.524 m)     Head Circumference --      Peak Flow --      Pain Score 11/28/22 1630 0     Pain Loc --      Pain Education --      Exclude from Growth Chart --     Most recent vital signs: Vitals:   11/28/22 1632 11/28/22 1826  BP: 121/72 116/74  Pulse: 82 (!) 58  Resp: 16 16  Temp: 98.2 F (36.8 C)   SpO2: 97% 98%    Constitutional: Alert and oriented. Eyes: Conjunctivae are normal. Head: Atraumatic. Nose: No congestion/rhinnorhea. Mouth/Throat:  Mucous membranes are moist.  Cardiovascular: Normal rate, regular rhythm. Grossly normal heart sounds.  2+ radial pulses bilaterally. Respiratory: Normal respiratory effort.  No retractions. Lungs CTAB. Gastrointestinal: Soft and nontender. No distention. Musculoskeletal: No lower extremity tenderness nor edema.  Neurologic:  Normal speech and language. No gross focal neurologic deficits are appreciated.    ED Results / Procedures / Treatments   Labs (all labs ordered are listed, but only abnormal results are displayed) Labs Reviewed  CBC - Abnormal; Notable for the following components:      Result Value   WBC 3.9 (*)    Hemoglobin 10.5 (*)    HCT 35.1 (*)    MCHC 29.9 (*)    All other components within normal limits  COMPREHENSIVE METABOLIC PANEL - Abnormal; Notable for the following components:   Glucose, Bld 146 (*)    All other components within normal limits  CBG MONITORING, ED - Abnormal; Notable for the following components:   Glucose-Capillary 158 (*)    All other components within normal limits  PROTIME-INR  APTT  DIFFERENTIAL  ETHANOL     EKG  ED ECG REPORT I, Chesley Noon, the attending physician, personally viewed and interpreted this ECG.   Date: 11/28/2022  EKG Time: 16:44  Rate: 78  Rhythm: normal sinus rhythm  Axis: Normal  Intervals:none  ST&T Change: None  RADIOLOGY CT head reviewed and interpreted by me with no hemorrhage or midline shift.  PROCEDURES:  Critical Care performed: No  Procedures   MEDICATIONS ORDERED IN ED: Medications - No data to display   IMPRESSION / MDM / ASSESSMENT AND PLAN / ED COURSE  I reviewed the triage vital signs and the nursing notes.                              57 y.o. female with past medical history of hypertension, hyperlipidemia, migraines, iron deficiency, and GERD presents to the ED following a 30-minute episode of slurred speech with feeling like there was a fog over her.  Patient's  presentation is most consistent with acute presentation with potential threat to life or bodily function.  Differential diagnosis includes, but is not limited to, stroke, TIA, anemia, electrode abnormality, AKI, migraine, arrhythmia.  Patient nontoxic-appearing and in no acute distress, vital signs are unremarkable.  Symptoms have resolved at the time of my evaluation and she has a nonfocal neurologic exam.  CT head is negative for acute process, labs are reassuring with no significant anemia, leukocytosis, tract abnormality, or AKI.  EKG shows no evidence of arrhythmia or ischemia and LFTs are also unremarkable.  Symptoms seem unlikely to represent stroke or TIA given no clear localizing symptoms beyond slurred speech, patient low risk by ABCD 2 score.  She is appropriate for outpatient management and was provided with referral to neurology.  She was counseled to return to the ED for new or worsening symptoms, patient agrees with plan.      FINAL CLINICAL IMPRESSION(S) / ED DIAGNOSES   Final diagnoses:  Slurred speech  Generalized weakness     Rx / DC Orders   ED Discharge Orders     None        Note:  This document was prepared using Dragon voice recognition software and may include unintentional dictation errors.   Chesley Noon, MD 11/28/22 [phone]

## 2022-11-29 ENCOUNTER — Telehealth: Payer: Self-pay

## 2022-11-29 NOTE — Transitions of Care (Post Inpatient/ED Visit) (Unsigned)
   11/29/2022  Name: [name] MRN: [id number] DOB: May 13, 1965  Today's TOC FU Call Status: Today's TOC FU Call Status:: Unsuccessful Call (1st Attempt) Unsuccessful Call (1st Attempt) Date: 11/29/22  Attempted to reach the patient regarding the most recent Inpatient/ED visit.  Follow Up Plan: Additional outreach attempts will be made to reach the patient to complete the Transitions of Care (Post Inpatient/ED visit) call.   Signature   Woodfin Ganja LPN [facility] Nurse Health Advisor Direct Dial [phone]

## 2022-11-30 NOTE — Transitions of Care (Post Inpatient/ED Visit) (Signed)
11/30/2022  Name: [name] MRN: [id number] DOB: [birth year]  Today's TOC FU Call Status: Today's TOC FU Call Status:: Successful TOC FU Call Completed Unsuccessful Call (1st Attempt) Date: 11/29/22 [facility] FU Call Complete Date: 11/30/22  Transition Care Management Follow-up Telephone Call Date of Discharge: 11/28/22 Discharge Facility: [facility]) Type of Discharge: Emergency Department Reason for ED Visit: Other: (slurred speech) How have you been since you were released from the hospital?: Better Any questions or concerns?: Yes Patient Questions/Concerns:: (S) wondering if she needs hemoglobin rechecked - appt is 03-03-23 Patient Questions/Concerns Addressed: (S) Notified Provider of Patient Questions/Concerns  Items Reviewed: Did you receive and understand the discharge instructions provided?: Yes Medications obtained,verified, and reconciled?: Yes (Medications Reviewed) Any new allergies since your discharge?: No Dietary orders reviewed?: Yes Do you have support at home?: Yes  Medications Reviewed Today: Medications Reviewed Today     Reviewed by Merleen Nicely, LPN (Licensed Practical Nurse) on 11/30/22 at 1141  Med List Status: <None>   Medication Order Taking? Sig Documenting Provider Last Dose Status Informant  amoxicillin-clavulanate (AUGMENTIN) 875-125 MG tablet [id number] No Take 1 tablet by mouth every 12 (twelve) hours.  Patient not taking: Reported on 11/30/2022   Carlisle Beers, FNP Not Taking Active     Discontinued 07/02/20 1316   diclofenac (VOLTAREN) 75 MG EC tablet [id number] Yes Take 1 tablet (75 mg total) by mouth 2 (two) times daily. Tarry Kos, MD Taking Active   eszopiclone Alfonso Patten) 2 MG TABS tablet [id number] Yes Take 1 tablet (2 mg total) by mouth at bedtime as needed for sleep. Take about 30 minutes before bedtime Olalere, Adewale A, MD Taking Active   ferrous sulfate 325 (65 FE) MG EC tablet [id number] Yes Take  325 mg by mouth 3 (three) times daily with meals. [provider] Taking Active   FLUoxetine (PROZAC) 20 MG capsule [id number] Yes Take 1 capsule (20 mg total) by mouth daily. Neysa Hotter, MD Taking Active   FLUoxetine (PROZAC) 40 MG capsule [id number] Yes Take 1 capsule (40 mg total) by mouth daily. Take total of 60 mg daily. Take along with 20 mg cap Hisada, Reina, MD Taking Active   ibuprofen (ADVIL) 600 MG tablet [id number] Yes Take 1 tablet (600 mg total) by mouth every 6 (six) hours as needed. Carlisle Beers, FNP Taking Active   losartan (COZAAR) 50 MG tablet [id number] Yes TAKE 1 TABLET (50 MG TOTAL) BY MOUTH DAILY. FOR BLOOD PRESSURE. Doreene Nest, NP Taking Active   methocarbamol (ROBAXIN) 500 MG tablet [id number] Yes Take 1 tablet (500 mg total) by mouth 2 (two) times daily. Raspet, Noberto Retort, PA-C Taking Active   pantoprazole (PROTONIX) 40 MG tablet [id number] Yes Take 1 tablet (40 mg total) by mouth daily. For heartburn. Doreene Nest, NP Taking Active   predniSONE (STERAPRED UNI-PAK 21 TAB) 10 MG (21) TBPK tablet [id number] Yes As directed Raspet, Erin K, PA-C Taking Active   QUEtiapine (SEROQUEL) 25 MG tablet [id number] No Take 1 tablet (25 mg total) by mouth at bedtime.  Patient not taking: Reported on 11/30/2022   Neysa Hotter, MD Not Taking Expired 09/13/22 2359            Med Note Herbert Deaner Jul 29, 2022 11:29 AM) Has not started taking yet     Discontinued 07/02/20 1316             Home Care and Equipment/Supplies: Were Home Health Services  Ordered?: No Any new equipment or medical supplies ordered?: No  Functional Questionnaire: Do you need assistance with bathing/showering or dressing?: No Do you need assistance with meal preparation?: No Do you need assistance with eating?: No Do you have difficulty maintaining continence: No Do you need assistance with getting out of bed/getting out of a chair/moving?: No Do you have difficulty  managing or taking your medications?: No  Follow up appointments reviewed: PCP Follow-up appointment confirmed?: Yes Follow-up Provider: Vernona Rieger NP [facility] Follow-up appointment confirmed?: No Do you need transportation to your follow-up appointment?: No Do you understand care options if your condition(s) worsen?: Yes-patient verbalized understanding    SIGNATURE  Woodfin Ganja LPN [facility] Nurse Health Advisor Direct Dial [phone]

## 2022-12-06 ENCOUNTER — Ambulatory Visit (INDEPENDENT_AMBULATORY_CARE_PROVIDER_SITE_OTHER): Payer: 59 | Admitting: Family

## 2022-12-06 ENCOUNTER — Encounter: Payer: Self-pay | Admitting: *Deleted

## 2022-12-06 VITALS — BP 132/84 | HR 67 | Temp 98.1°F | Ht 60.0 in | Wt 164.2 lb

## 2022-12-06 DIAGNOSIS — R5383 Other fatigue: Secondary | ICD-10-CM | POA: Diagnosis not present

## 2022-12-06 DIAGNOSIS — R739 Hyperglycemia, unspecified: Secondary | ICD-10-CM | POA: Insufficient documentation

## 2022-12-06 DIAGNOSIS — G4719 Other hypersomnia: Secondary | ICD-10-CM

## 2022-12-06 DIAGNOSIS — R413 Other amnesia: Secondary | ICD-10-CM | POA: Diagnosis not present

## 2022-12-06 DIAGNOSIS — Z72820 Sleep deprivation: Secondary | ICD-10-CM | POA: Diagnosis not present

## 2022-12-06 DIAGNOSIS — D509 Iron deficiency anemia, unspecified: Secondary | ICD-10-CM | POA: Diagnosis not present

## 2022-12-06 DIAGNOSIS — F22 Delusional disorders: Secondary | ICD-10-CM

## 2022-12-06 DIAGNOSIS — E785 Hyperlipidemia, unspecified: Secondary | ICD-10-CM

## 2022-12-06 LAB — IBC + FERRITIN
Ferritin: 4.2 ng/mL — ABNORMAL LOW (ref 10.0–291.0)
Iron: 21 ug/dL — ABNORMAL LOW (ref 42–145)
Saturation Ratios: 4.5 % — ABNORMAL LOW (ref 20.0–50.0)
TIBC: 470.4 ug/dL — ABNORMAL HIGH (ref 250.0–450.0)
Transferrin: 336 mg/dL (ref 212.0–360.0)

## 2022-12-06 LAB — HEMOGLOBIN A1C: Hgb A1c MFr Bld: 6 % (ref 4.6–6.5)

## 2022-12-06 LAB — TSH: TSH: 1.78 u[IU]/mL (ref 0.35–5.50)

## 2022-12-06 LAB — VITAMIN B12: Vitamin B-12: 332 pg/mL (ref 211–911)

## 2022-12-06 MED ORDER — QUETIAPINE FUMARATE 25 MG PO TABS: 25.0000 mg | ORAL_TABLET | Freq: Every day | ORAL | Status: DC

## 2022-12-06 MED ORDER — ESZOPICLONE 2 MG PO TABS: 2.0000 mg | ORAL_TABLET | Freq: Every evening | ORAL | Status: DC | PRN

## 2022-12-06 NOTE — Progress Notes (Signed)
Established Patient Office Visit  Subjective:      CC:  Chief Complaint  Patient presents with   Follow-up    HPI: [name] is a 57 y.o. female presenting on 12/06/2022 for Follow-up . Went to ER 8/18 for symptoms of 'fogginess' and states she felt as though she was 'coming out of her body' and had some slurred speech. She had reports of left leg neuralgia and restlessness. EKG NSR. Ct head no acute findings. Labs were reassuring. Referral was placed for neurology for further eval/treat  Lab Results  Component Value Date   WBC 3.9 (L) 11/28/2022   HGB 10.5 (L) 11/28/2022   HCT 35.1 (L) 11/28/2022   MCV 87.5 11/28/2022   PLT 361 11/28/2022   Last metabolic panel Lab Results  Component Value Date   GLUCOSE 146 (H) 11/28/2022   NA 140 11/28/2022   K 3.8 11/28/2022   CL 108 11/28/2022   CO2 25 11/28/2022   BUN 18 11/28/2022   CREATININE 0.66 11/28/2022   GFRNONAA >60 11/28/2022   CALCIUM 9.1 11/28/2022   PROT 6.9 11/28/2022   ALBUMIN 3.6 11/28/2022   LABGLOB 2.6 11/26/2017   AGRATIO 1.6 11/26/2017   BILITOT 0.6 11/28/2022   ALKPHOS 94 11/28/2022   AST 23 11/28/2022   ALT 16 11/28/2022   ANIONGAP 7 11/28/2022     New complaints: She does state she still feels weak and tired and its hard for her to think clearly. She feels lightheaded on and off, and wobbly. She states this has been going on for the last one month or so. She feels overly fatigued requiring naps throughout the day.  Colonoscopy 05/11/22 un remarkable, repeat in ten years.  EGD 05/11/22 large hiatal hernia, LA grade 3 esophagitis without hemorrhage, completed 3 months of 40 mg omeprazole but is still continuing on it.  08/30/22 last saw Dr. Bonnita Hollow She is not currently taking iron.   Depression; has been improved. She states not often taking ibuprofen. She ran out of voltaren and just did not pick up. She does state that she does occasionally have delusions at times, but they have been slightly  improved. She has since stopped her seroquel , but she plans to restart. She also is not sleeping well, stopped her lunesta, and is only sleeping 3-4 hours.   Social history:  Relevant past medical, surgical, family and social history reviewed and updated as indicated. Interim medical history since our last visit reviewed.  Allergies and medications reviewed and updated.  DATA REVIEWED: CHART IN EPIC     ROS: Negative unless specifically indicated above in HPI.    Current Outpatient Medications:    atorvastatin (LIPITOR) 20 MG tablet, Take 1 tablet (20 mg total) by mouth every evening. For cholesterol., Disp: 90 tablet, Rfl: 3   ferrous sulfate 325 (65 FE) MG EC tablet, Take 325 mg by mouth 3 (three) times daily with meals., Disp: , Rfl:    FLUoxetine (PROZAC) 40 MG capsule, Take 1 capsule (40 mg total) by mouth daily. Take total of 60 mg daily. Take along with 20 mg cap, Disp: 90 capsule, Rfl: 0   losartan (COZAAR) 50 MG tablet, TAKE 1 TABLET (50 MG TOTAL) BY MOUTH DAILY. FOR BLOOD PRESSURE., Disp: 90 tablet, Rfl: 0   pantoprazole (PROTONIX) 40 MG tablet, Take 1 tablet (40 mg total) by mouth daily. For heartburn., Disp: 90 tablet, Rfl: 1   eszopiclone (LUNESTA) 2 MG TABS tablet, Take 1 tablet (2 mg total) by  mouth at bedtime as needed for sleep. Take about 30 minutes before bedtime, Disp: , Rfl:    QUEtiapine (SEROQUEL) 25 MG tablet, Take 1 tablet (25 mg total) by mouth at bedtime., Disp: , Rfl:       Objective:    BP 132/84   Pulse 67   Temp 98.1 F (36.7 C) (Oral)   Ht 5' (1.524 m)   Wt 164 lb 3.2 oz (74.5 kg)   LMP  (LMP Unknown)   SpO2 97%   BMI 32.07 kg/m   Wt Readings from Last 3 Encounters:  12/06/22 164 lb 3.2 oz (74.5 kg)  11/28/22 175 lb (79.4 kg)  08/30/22 171 lb 9.6 oz (77.8 kg)    Physical Exam Constitutional:      General: She is not in acute distress.    Appearance: Normal appearance. She is normal weight. She is not ill-appearing, toxic-appearing or  diaphoretic.  HENT:     Head: Normocephalic.  Cardiovascular:     Rate and Rhythm: Normal rate and regular rhythm.  Pulmonary:     Effort: Pulmonary effort is normal.     Breath sounds: Normal breath sounds.  Musculoskeletal:        General: Normal range of motion.  Neurological:     General: No focal deficit present.     Mental Status: She is alert and oriented to person, place, and time. Mental status is at baseline.     Cranial Nerves: Cranial nerves 2-12 are intact.     Sensory: Sensation is intact.     Motor: Motor function is intact.     Coordination: Coordination is intact.     Gait: Gait is intact.  Psychiatric:        Attention and Perception: Attention normal.        Mood and Affect: Mood normal.        Behavior: Behavior normal.        Thought Content: Thought content normal.        Cognition and Memory: She exhibits impaired recent memory.        Judgment: Judgment normal.       12/06/2022    9:03 AM  Montreal Cognitive Assessment   Visuospatial/ Executive (0/5) 5  Naming (0/3) 3  Attention: Read list of digits (0/2) 2  Attention: Read list of letters (0/1) 1  Attention: Serial 7 subtraction starting at 100 (0/3) 3  Language: Repeat phrase (0/2) 2  Language : Fluency (0/1) 1  Abstraction (0/2) 2  Delayed Recall (0/5) 4  Orientation (0/6) 6  Total 29  Adjusted Score (based on education) 30           Assessment & Plan:  Poor sleep -     Ambulatory referral to Sleep Studies -     Ambulatory referral to Neurology  Excessive daytime sleepiness Assessment & Plan: Advised pt to restart lunesta  And also to work on good sleep hygiene  Will order sleep study to r/o OSA  As far as delusions and lack of sleep, restart seroquel as well.   Orders: -     Ambulatory referral to Sleep Studies -     Ambulatory referral to Neurology  Other fatigue  Memory change Assessment & Plan: Ordering b12, cbc low ordering ibc ferritin.  MOCA today in office.   Referral placed for neurology for eval/treat  Orders: -     Vitamin B12 -     TSH -     Ambulatory referral to  Neurology  Iron deficiency anemia, unspecified iron deficiency anemia type Assessment & Plan: Restart iron supplementation  Pt to call hematology, might need another iron infusion   Orders: -     IBC + Ferritin -     Fecal occult blood, imunochemical; Future  Hyperglycemia Assessment & Plan: A1c today  Less likely diabetes more likely ingestion of sweets that day prior.    Orders: -     Hemoglobin A1c  Paranoid delusion (HCC) Assessment & Plan: Restart seroquel    Hyperlipidemia, unspecified hyperlipidemia type  Other orders -     QUEtiapine Fumarate; Take 1 tablet (25 mg total) by mouth at bedtime. -     Eszopiclone; Take 1 tablet (2 mg total) by mouth at bedtime as needed for sleep. Take about 30 minutes before bedtime     Return in about 3 weeks (around 12/27/2022) for f/u memory change .  Mort Sawyers, MSN, APRN, FNP-C [place] [facility]

## 2022-12-06 NOTE — Assessment & Plan Note (Signed)
A1c today  Less likely diabetes more likely ingestion of sweets that day prior.

## 2022-12-06 NOTE — Assessment & Plan Note (Signed)
Restart seroquel 

## 2022-12-06 NOTE — Assessment & Plan Note (Signed)
Ordering b12, cbc low ordering ibc ferritin.  MOCA today in office.  Referral placed for neurology for eval/treat

## 2022-12-06 NOTE — Assessment & Plan Note (Signed)
Restart iron supplementation  Pt to call hematology, might need another iron infusion

## 2022-12-06 NOTE — Patient Instructions (Addendum)
  A referral was placed today for neurology  Please let us know if you have not heard back within 2 weeks about the referral. Restart lunesta and seroquel.   Call hematology for iron infusions Restart iron daily   Regards,   Sinead Hockman FNP-C

## 2022-12-06 NOTE — Assessment & Plan Note (Signed)
Advised pt to restart lunesta  And also to work on good sleep hygiene  Will order sleep study to r/o OSA  As far as delusions and lack of sleep, restart seroquel as well.

## 2022-12-17 ENCOUNTER — Encounter: Payer: Self-pay | Admitting: *Deleted

## 2022-12-20 ENCOUNTER — Ambulatory Visit
Admission: RE | Admit: 2022-12-20 | Discharge: 2022-12-20 | Disposition: A | Discharge status: Home / Self Care | Payer: 59 | Source: Ambulatory Visit | Attending: Orthopaedic Surgery | Admitting: Orthopaedic Surgery

## 2022-12-20 DIAGNOSIS — G8929 Other chronic pain: Secondary | ICD-10-CM

## 2022-12-20 MED ORDER — IOPAMIDOL (ISOVUE-M 200) INJECTION 41%
10.0000 mL | Freq: Once | INTRAMUSCULAR | Status: AC
  Administered 2022-12-20: 10 mL via INTRA_ARTICULAR

## 2022-12-28 ENCOUNTER — Ambulatory Visit: Payer: 59 | Admitting: Orthopaedic Surgery

## 2022-12-28 DIAGNOSIS — M25512 Pain in left shoulder: Secondary | ICD-10-CM | POA: Diagnosis not present

## 2022-12-28 DIAGNOSIS — G8929 Other chronic pain: Secondary | ICD-10-CM

## 2022-12-28 NOTE — Progress Notes (Signed)
Great thx

## 2022-12-28 NOTE — Progress Notes (Signed)
Delphia Grates I am sending this lady to you.

## 2022-12-28 NOTE — Progress Notes (Signed)
Office Visit Note   Patient: [name]           Date of Birth: [birth year]           MRN: [id number] Visit Date: 12/28/2022              Requested by: Doreene Nest, NP [address] [place],  [place] [address] PCP: Doreene Nest, NP   Assessment & Plan: Visit Diagnoses:  1. Chronic left shoulder pain     Plan: MRI findings of the left shoulder were reviewed with the patient.  Based on these findings I would like her to see Dr. August Saucer for further evaluation and treatment.  Follow-up as needed with me.  Follow-Up Instructions: No follow-ups on file.   Orders:  No orders of the defined types were placed in this encounter.  No orders of the defined types were placed in this encounter.     Procedures: No procedures performed   Clinical Data: No additional findings.   Subjective: Chief Complaint  Patient presents with   Left Shoulder - Pain    HPI [name] returns today for MRI review. Review of Systems   Objective: Vital Signs: LMP  (LMP Unknown)   Physical Exam  Ortho Exam Examination of the left shoulder is unchanged. Specialty Comments:  No specialty comments available.  Imaging: No results found.   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) 11/27/2021   Vitamin D deficiency 11/27/2021   Chronic fatigue 11/27/2021   Paranoid delusion (HCC) 11/27/2021   GERD (gastroesophageal reflux disease) 04/25/2019   Hyperlipidemia 03/02/2019   Essential hypertension 02/18/2019   Recurrent cystitis 11/27/2012   Migraine with visual aura 09/19/2011   Chronic constipation 06/25/2011   Past Medical History:  Diagnosis Date   Anemia    BV (bacterial vaginosis)  11/27/2012   Celiac disease    Cough due to ACE inhibitor 04/25/2019   Depression    Essential hypertension    Family history of adverse reaction to anesthesia    MOM-HARD TIME WAKING UP   GERD (gastroesophageal reflux disease)    Migraine with visual aura    MIGRAINES   UTI (lower urinary tract infection)     Family History  Problem Relation Age of Onset   Hypertension Mother    Stroke Mother    Cancer Father    Hypertension Maternal Grandmother    Colon cancer Maternal Grandmother    Breast cancer Maternal Grandmother    Skin cancer Maternal Grandmother    Hypertension Maternal Grandfather    Hypertension Paternal Grandmother    Diabetes Paternal Grandmother    Hypertension Paternal Grandfather    Diabetes Son 12       type 1    Past Surgical History:  Procedure Laterality Date   ABDOMINAL HYSTERECTOMY     BLADDER SUSPENSION     COLONOSCOPY WITH PROPOFOL N/A 05/11/2022   Procedure: COLONOSCOPY WITH PROPOFOL;  Surgeon: Wyline Mood, MD;  Location: [facility] ENDOSCOPY;  Service: Gastroenterology;  Laterality: N/A;   ESOPHAGOGASTRODUODENOSCOPY (EGD) WITH PROPOFOL N/A 05/11/2022   Procedure: ESOPHAGOGASTRODUODENOSCOPY (EGD) WITH PROPOFOL;  Surgeon: Wyline Mood, MD;  Location: [facility] ENDOSCOPY;  Service: Gastroenterology;  Laterality: N/A;   EXPLORATORY LAPAROTOMY  IUD REMOVAL  07/25/2017   Procedure: INTRAUTERINE DEVICE (IUD) REMOVAL;  Surgeon: Linzie Collin, MD;  Location: [facility] ORS;  Service: Gynecology;;   LAPAROSCOPIC ASSISTED VAGINAL HYSTERECTOMY Bilateral 07/25/2017   Procedure: LAPAROSCOPIC ASSISTED VAGINAL HYSTERECTOMY WITH BILATERAL SALPING OOPHERECTOMY;  Surgeon: Linzie Collin, MD;  Location: [facility] ORS;  Service: Gynecology;  Laterality: Bilateral;   TUBAL LIGATION     Social History   Occupational History   Occupation: Corporate treasurer  Tobacco Use   Smoking status: Never   Smokeless tobacco: Never  Vaping Use   Vaping status: Never Used   Substance and Sexual Activity   Alcohol use: No    Alcohol/week: 0.0 standard drinks of alcohol   Drug use: No   Sexual activity: Yes    Partners: Female    Birth control/protection: Surgical    Comment: INTERCOURSE AGE 40, SEXUAL PARTNERS LEES THAN 6

## 2022-12-28 NOTE — Addendum Note (Signed)
Addended by: Rip Harbour on: 12/28/2022 03:49 PM   Modules accepted: Orders

## 2022-12-30 ENCOUNTER — Other Ambulatory Visit: Payer: Self-pay | Admitting: Primary Care

## 2022-12-30 DIAGNOSIS — K219 Gastro-esophageal reflux disease without esophagitis: Secondary | ICD-10-CM

## 2023-01-04 ENCOUNTER — Ambulatory Visit: Payer: 59 | Admitting: Clinical

## 2023-01-05 ENCOUNTER — Ambulatory Visit (INDEPENDENT_AMBULATORY_CARE_PROVIDER_SITE_OTHER): Payer: 59 | Admitting: Orthopedic Surgery

## 2023-01-05 DIAGNOSIS — M75122 Complete rotator cuff tear or rupture of left shoulder, not specified as traumatic: Secondary | ICD-10-CM

## 2023-01-06 ENCOUNTER — Encounter: Payer: Self-pay | Admitting: Orthopedic Surgery

## 2023-01-06 NOTE — Progress Notes (Signed)
Office Visit Note   Patient: [name]           Date of Birth: [birth year]           MRN: [id number] Visit Date: 01/05/2023 Requested by: Cammy Copa, MD [address] [place],  [place] [address] PCP: Doreene Nest, NP  Subjective: Chief Complaint  Patient presents with   Left Shoulder - Pain    HPI: [name] is a 57 y.o. female who presents to the office reporting left shoulder pain.  Patient had a glenohumeral joint injection 11/19/2022 which gave her only marginal relief.  She has had pain for several months.  Symptoms are getting worse.  Pain radiates to her biceps.  She only had about 1 to 2 days of relief from her glenohumeral joint injection.  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  Does describe a lot of popping in the left shoulder.  Entire shoulder region hurts.  Does take Tylenol ibuprofen and Aleve without much help.  Tramadol also not very helpful.  She has had an MRI scan which is reviewed with the patient.  This does show full-thickness tear of the supraspinatus anteriorly in the region of the myotendinous junction.  Distal tendon insertion intact with moderate tendinosis but no tear.  There is some focal tendinosis of the long head of the biceps tendons.  Also there is widening of the [facility] joint with disruption of the inferior acromioclavicular ligament..                ROS: All systems reviewed are negative as they relate to the chief complaint within the history of present illness.  Patient denies fevers or chills.  Assessment & Plan: Visit Diagnoses:  1. Complete tear of left rotator cuff, unspecified whether traumatic     Plan: Impression is left shoulder pain with multiple pain generators in the shoulder based on exam and MRI scanning.  That includes this myotendinous junction tear of the supraspinatus along with biceps tendon as well as AC joint.  These are also  pain generators on exam.  Plan is shoulder arthroscopy with debridement biceps tendon release biceps tenodesis distal clavicle excision arthroscopically and the mini open rotator cuff tear repair possibly with the patch.  It does not guarantee that we will be able to affect a meaningful repair of the rotator cuff tendon in that location.  In fact it is more of a tear in the muscle tendon interface.  Nonetheless she cannot really get on like she is going and she has failed conservative treatment.  The risks and benefits of surgery are discussed including not limited to infection nerve or vessel damage incomplete pain relief as well as incomplete restoration of function.  Patient understands risk and benefits and wishes to proceed.  Expected rehabilitation time also discussed.  Minimum 3 months for her to get back to physical work.  All questions answered  Follow-Up Instructions: No follow-ups on file.   Orders:  No orders of the defined types were placed in this encounter.  No orders of the defined types were placed in this encounter.     Procedures: No procedures performed   Clinical Data: No additional findings.  Objective: Vital Signs: LMP  (LMP Unknown)   Physical Exam:  Constitutional: Patient appears well-developed HEENT:  Head: Normocephalic Eyes:EOM are normal Neck: Normal range of motion Cardiovascular: Normal rate Pulmonary/chest: Effort normal  Neurologic: Patient is alert Skin: Skin is warm Psychiatric: Patient has normal mood and affect  Ortho Exam: Ortho exam demonstrates painful range of motion with forward flexion and abduction.  Pretty reasonable strength to external rotation and subscap testing on the left.  Does have more tenderness in the left AC joint compared to the right.  No masses lymphadenopathy or skin changes noted in the shoulder girdle region.  Negative apprehension relocation testing cervical spine range of motion full.  Motor or sensory function of the  left arm and hand intact.  Does have tenderness to the bicipital groove.  O'Brien's testing positive on the left negative on the right.  Specialty Comments:  No specialty comments available.  Imaging: No results found.   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) 11/27/2021   Vitamin D deficiency 11/27/2021   Chronic fatigue 11/27/2021   Paranoid delusion (HCC) 11/27/2021   GERD (gastroesophageal reflux disease) 04/25/2019   Hyperlipidemia 03/02/2019   Essential hypertension 02/18/2019   Recurrent cystitis 11/27/2012   Migraine with visual aura 09/19/2011   Chronic constipation 06/25/2011   Past Medical History:  Diagnosis Date   Anemia    BV (bacterial vaginosis) 11/27/2012   Celiac disease    Cough due to ACE inhibitor 04/25/2019   Depression    Essential hypertension    Family history of adverse reaction to anesthesia    MOM-HARD TIME WAKING UP   GERD (gastroesophageal reflux disease)    Migraine with visual aura    MIGRAINES   UTI (lower urinary tract infection)     Family History  Problem Relation Age of Onset   Hypertension Mother    Stroke Mother    Cancer Father    Hypertension Maternal Grandmother    Colon cancer Maternal Grandmother    Breast cancer Maternal Grandmother    Skin cancer Maternal Grandmother    Hypertension Maternal Grandfather    Hypertension Paternal Grandmother    Diabetes Paternal Grandmother    Hypertension Paternal Grandfather    Diabetes Son 12       type 1    Past Surgical History:  Procedure Laterality Date   ABDOMINAL HYSTERECTOMY     BLADDER SUSPENSION     COLONOSCOPY WITH PROPOFOL N/A 05/11/2022   Procedure: COLONOSCOPY WITH PROPOFOL;   Surgeon: Wyline Mood, MD;  Location: [facility] ENDOSCOPY;  Service: Gastroenterology;  Laterality: N/A;   ESOPHAGOGASTRODUODENOSCOPY (EGD) WITH PROPOFOL N/A 05/11/2022   Procedure: ESOPHAGOGASTRODUODENOSCOPY (EGD) WITH PROPOFOL;  Surgeon: Wyline Mood, MD;  Location: [facility] ENDOSCOPY;  Service: Gastroenterology;  Laterality: N/A;   EXPLORATORY LAPAROTOMY     IUD REMOVAL  07/25/2017   Procedure: INTRAUTERINE DEVICE (IUD) REMOVAL;  Surgeon: Linzie Collin, MD;  Location: [facility] ORS;  Service: Gynecology;;   LAPAROSCOPIC ASSISTED VAGINAL HYSTERECTOMY Bilateral 07/25/2017   Procedure: LAPAROSCOPIC ASSISTED VAGINAL HYSTERECTOMY WITH BILATERAL SALPING OOPHERECTOMY;  Surgeon: Linzie Collin, MD;  Location: [facility] ORS;  Service: Gynecology;  Laterality: Bilateral;   TUBAL LIGATION     Social History   Occupational History   Occupation: Corporate treasurer  Tobacco Use   Smoking status: Never   Smokeless tobacco: Never  Vaping Use   Vaping status: Never Used  Substance and Sexual  Activity   Alcohol use: No    Alcohol/week: 0.0 standard drinks of alcohol   Drug use: No   Sexual activity: Yes    Partners: Female    Birth control/protection: Surgical    Comment: INTERCOURSE AGE 45, SEXUAL PARTNERS LEES THAN 5

## 2023-01-14 ENCOUNTER — Ambulatory Visit: Payer: 59 | Admitting: Orthopedic Surgery

## 2023-01-20 NOTE — Pre-Procedure Instructions (Addendum)
Surgical Instructions   Your procedure is scheduled on January 27, 2023. Report to [facility] Main Entrance "A" at 5:30 A.M., then check in with the Admitting office. Any questions or running late day of surgery: call [phone]  Questions prior to your surgery date: call [phone], Monday-Friday, 8am-4pm. If you experience any cold or flu symptoms such as cough, fever, chills, shortness of breath, etc. between now and your scheduled surgery, please notify us at the above number.     Remember:  Do not eat after midnight the night before your surgery   You may drink clear liquids until 4:30 AM the morning of your surgery.   Clear liquids allowed are: Water, Non-Citrus Juices (without pulp), Carbonated Beverages, Clear Tea, Black Coffee Only (NO MILK, CREAM OR POWDERED CREAMER of any kind), and Gatorade.  Patient Instructions  The night before surgery:  No food after midnight. ONLY clear liquids after midnight  The day of surgery (if you do NOT have diabetes):  Drink ONE (1) Pre-Surgery Clear Ensure by 4:30 AM the morning of surgery. Drink in one sitting. Do not sip.  This drink was given to you during your hospital  pre-op appointment visit.  Nothing else to drink after completing the  Pre-Surgery Clear Ensure.         If you have questions, please contact your surgeon's office.     Take these medicines the morning of surgery with A SIP OF WATER: FLUoxetine (PROZAC)  pantoprazole (PROTONIX)    One week prior to surgery, STOP taking any Aspirin (unless otherwise instructed by your surgeon) Aleve, Naproxen, Ibuprofen, Motrin, Advil, Goody's, BC's, all herbal medications, fish oil, and non-prescription vitamins.                     Do NOT Smoke (Tobacco/Vaping) for 24 hours prior to your procedure.  If you use a CPAP at night, you may bring your mask/headgear for your overnight stay.   You will be asked to remove any contacts, glasses, piercing's, hearing aid's,  dentures/partials prior to surgery. Please bring cases for these items if needed.    Patients discharged the day of surgery will not be allowed to drive home, and someone needs to stay with them for 24 hours.  SURGICAL WAITING ROOM VISITATION Patients may have no more than 2 support people in the waiting area - these visitors may rotate.   Pre-op nurse will coordinate an appropriate time for 1 ADULT support person, who may not rotate, to accompany patient in pre-op.  Children under the age of 47 must have an adult with them who is not the patient and must remain in the main waiting area with an adult.  If the patient needs to stay at the hospital during part of their recovery, the visitor guidelines for inpatient rooms apply.  Please refer to the [facility] website for the visitor guidelines for any additional information.   If you received a COVID test during your pre-op visit  it is requested that you wear a mask when out in public, stay away from anyone that may not be feeling well and notify your surgeon if you develop symptoms. If you have been in contact with anyone that has tested positive in the last 10 days please notify you surgeon.      Pre-operative CHG Bathing Instructions   You can play a key role in reducing the risk of infection after surgery. Your skin needs to be as free of germs as  possible. You can reduce the number of germs on your skin by washing with CHG (chlorhexidine gluconate) soap before surgery. CHG is an antiseptic soap that kills germs and continues to kill germs even after washing.   DO NOT use if you have an allergy to chlorhexidine/CHG or antibacterial soaps. If your skin becomes reddened or irritated, stop using the CHG and notify one of our RNs at [phone].              TAKE A SHOWER THE NIGHT BEFORE SURGERY AND THE DAY OF SURGERY    Please keep in mind the following:  DO NOT shave, including legs and underarms, 48 hours prior to surgery.   You may  shave your face before/day of surgery.  Place clean sheets on your bed the night before surgery Use a clean washcloth (not used since being washed) for each shower. DO NOT sleep with pet's night before surgery.  CHG Shower Instructions:  Wash your face and private area with normal soap. If you choose to wash your hair, wash first with your normal shampoo.  After you use shampoo/soap, rinse your hair and body thoroughly to remove shampoo/soap residue.  Turn the water OFF and apply half the bottle of CHG soap to a CLEAN washcloth.  Apply CHG soap ONLY FROM YOUR NECK DOWN TO YOUR TOES (washing for 3-5 minutes)  DO NOT use CHG soap on face, private areas, open wounds, or sores.  Pay special attention to the area where your surgery is being performed.  If you are having back surgery, having someone wash your back for you may be helpful. Wait 2 minutes after CHG soap is applied, then you may rinse off the CHG soap.  Pat dry with a clean towel  Put on clean pajamas    Additional instructions for the day of surgery: DO NOT APPLY any lotions, deodorants, cologne, or perfumes.   Do not wear jewelry or makeup Do not wear nail polish, gel polish, artificial nails, or any other type of covering on natural nails (fingers and toes) Do not bring valuables to the hospital. [facility] is not responsible for valuables/personal belongings. Put on clean/comfortable clothes.  Please brush your teeth.  Ask your nurse before applying any prescription medications to the skin.   [place]- Preparing for Shoulder Surgery  Before surgery, you can play an important role. Because skin is not sterile, your skin needs to be as free of germs as possible. You can reduce the number of germs on your skin by using the following products.   Benzoyl Peroxide Gel  o Reduces the number of germs present on the skin  o Applied twice a day to shoulder area starting two days before surgery   Chlorhexidine Gluconate  (CHG) Soap (instructions listed above on how to wash with CHG Soap)  o An antiseptic cleaner that kills germs and bonds with the skin to continue killing germs even after washing  o Used for showering the night before surgery and morning of surgery   ==================================================================  Please follow these instructions carefully:  BENZOYL PEROXIDE 5% GEL  Please do not use if you have an allergy to benzoyl peroxide. If your skin becomes reddened/irritated stop using the benzoyl peroxide.  Starting two days before surgery, apply as follows:  1. Apply benzoyl peroxide in the morning and at night. Apply after taking a shower. If you are not taking a shower clean entire shoulder front, back, and side along with the armpit with a  clean wet washcloth.  2. Place a quarter-sized dollop on your SHOULDER and rub in thoroughly, making sure to cover the front, back, and side of your shoulder, along with the armpit.   2 Days prior to Surgery First Dose on _____________ Morning Second Dose on ______________ Night  Day Before Surgery First Dose on ______________ Morning Night before surgery wash (entire body except face and private areas) with CHG Soap THEN Second Dose on ____________ Night   Morning of Surgery  wash BODY AGAIN with CHG Soap   4. Do NOT apply benzoyl peroxide gel on the day of surgery   Please read over the following fact sheets that you were given.

## 2023-01-21 ENCOUNTER — Other Ambulatory Visit: Payer: Self-pay

## 2023-01-21 ENCOUNTER — Encounter (HOSPITAL_COMMUNITY)
Admission: RE | Admit: 2023-01-21 | Discharge: 2023-01-21 | Disposition: A | Discharge status: Home / Self Care | Payer: 59 | Source: Ambulatory Visit | Attending: Orthopedic Surgery | Admitting: Orthopedic Surgery

## 2023-01-21 ENCOUNTER — Encounter (HOSPITAL_COMMUNITY): Payer: Self-pay

## 2023-01-21 DIAGNOSIS — Z01812 Encounter for preprocedural laboratory examination: Secondary | ICD-10-CM | POA: Diagnosis present

## 2023-01-21 DIAGNOSIS — Z01818 Encounter for other preprocedural examination: Secondary | ICD-10-CM

## 2023-01-21 LAB — BASIC METABOLIC PANEL
Anion gap: 10 (ref 5–15)
BUN: 13 mg/dL (ref 6–20)
CO2: 23 mmol/L (ref 22–32)
Calcium: 8.9 mg/dL (ref 8.9–10.3)
Chloride: 104 mmol/L (ref 98–111)
Creatinine, Ser: 0.72 mg/dL (ref 0.44–1.00)
GFR, Estimated: >60 mL/min (ref >60)
Glucose, Bld: 83 mg/dL (ref 70–99)
Potassium: 3.4 mmol/L — ABNORMAL LOW (ref 3.5–5.1)
Sodium: 137 mmol/L (ref 135–145)

## 2023-01-21 LAB — CBC
HCT: 29.8 % — ABNORMAL LOW (ref 36.0–46.0)
Hemoglobin: 8.5 g/dL — ABNORMAL LOW (ref 12.0–15.0)
MCH: 22.1 pg — ABNORMAL LOW (ref 26.0–34.0)
MCHC: 28.5 g/dL — ABNORMAL LOW (ref 30.0–36.0)
MCV: 77.6 fL — ABNORMAL LOW (ref 80.0–100.0)
Platelets: 315 10*3/uL (ref 150–400)
RBC: 3.84 MIL/uL — ABNORMAL LOW (ref 3.87–5.11)
RDW: 16.3 % — ABNORMAL HIGH (ref 11.5–15.5)
WBC: 4 10*3/uL (ref 4.0–10.5)
nRBC: 0 % (ref 0.0–0.2)

## 2023-01-21 NOTE — Progress Notes (Addendum)
PCP - Vernona Rieger NP Cardiologist - Denies  PPM/ICD - Denies  Chest x-ray - 08/23/22 EKG - 11/28/22 Stress Test - 03/29/19 ECHO - 03/29/19 Cardiac Cath - Denies  Sleep Study - Denies  DM - Denies  ERAS Protcol -yes PRE-SURGERY Ensure given   COVID TEST- N/A   Anesthesia review: Yes abnormal labs  Patient denies shortness of breath, fever, cough and chest pain at PAT appointment   All instructions explained to the patient, with a verbal understanding of the material. Patient agrees to go over the instructions while at home for a better understanding. The opportunity to ask questions was provided.

## 2023-01-25 ENCOUNTER — Telehealth: Payer: Self-pay | Admitting: Orthopedic Surgery

## 2023-01-25 NOTE — Telephone Encounter (Signed)
Thanks Debbie.  I called her yesterday also and she gave me the same story.  Overall she feels fine and we are not to lose hardly any blood doing that surgery so I think we should be okay.

## 2023-01-25 NOTE — Telephone Encounter (Signed)
Patient is scheduled 01-27-23 with Dr. Dorene Grebe at [facility] for left shoulder arthroscopy, debridement, distal clavicle excision, mini biceps tenodesis and rotator cuff tear repair.  Patient had preadmission appointment last Friday, October 11th.  Patient's hemoglobin came back at 8.5 which is the all time low.  Last month was 10.5. May results were 13.3.  Antionette Poles with short stay anesthesia tried to reach patient today to discuss the results however was unsuccessful.  I also called patient but was unable to leave message because mailbox is full.  I spoke with patient this afternoon at 5:45pm when she finished work.   She states she was supposed to be taking her iron pills, but she had slacked off and just recently (as of this past Friday) started taking them again.  A message regarding the lab results was left on triage voice mail at [phone] for Dr. Michaelyn Barter with Hematology-Oncology at [facility].   Anesthesia recommended letting patient's treating physician know about her upcoming surgery. Patient states she will reach out to Dr. Alena Bills tomorrow morning and will return anesthesia's phone call.

## 2023-01-26 NOTE — Anesthesia Preprocedure Evaluation (Addendum)
Anesthesia Evaluation  Patient identified by MRN, date of birth, ID band Patient awake    Reviewed: Allergy & Precautions, NPO status , Patient's Chart, lab work & pertinent test results  History of Anesthesia Complications Negative for: history of anesthetic complications  Airway Mallampati: II  TM Distance: >3 FB Neck ROM: Full    Dental  (+) Chipped, Missing,    Pulmonary neg pulmonary ROS   Pulmonary exam normal        Cardiovascular hypertension, Pt. on medications Normal cardiovascular exam  Stress echo 2020: normal EF, mild MR   Neuro/Psych  Headaches   Depression       GI/Hepatic Neg liver ROS,GERD  Medicated,,Celiac dx   Endo/Other  negative endocrine ROS    Renal/GU negative Renal ROS     Musculoskeletal  (+) Arthritis ,  left shoulder rotator cuff tear, biceps tendinitis, acromioclavicular osteoarthritis   Abdominal   Peds  Hematology  (+) Blood dyscrasia (Hgb 8.5), anemia   Anesthesia Other Findings Day of surgery medications reviewed with patient.  Reproductive/Obstetrics                             Anesthesia Physical Anesthesia Plan  ASA: 3  Anesthesia Plan: General   Post-op Pain Management: Tylenol PO (pre-op)* and Regional block*   Induction: Intravenous  PONV Risk Score and Plan: 3 and Treatment may vary due to age or medical condition, Dexamethasone, Midazolam, Scopolamine patch - Pre-op and Droperidol  Airway Management Planned: Oral ETT  Additional Equipment: None  Intra-op Plan:   Post-operative Plan: Extubation in OR  Informed Consent: I have reviewed the patients History and Physical, chart, labs and discussed the procedure including the risks, benefits and alternatives for the proposed anesthesia with the patient or authorized representative who has indicated his/her understanding and acceptance.     Dental advisory given  Plan Discussed with:  CRNA  Anesthesia Plan Comments: (PAT note by Antionette Poles, PA-C: 57 year old female with pertinent history including GERD, migraines, HTN, iron deficiency anemia, large hiatal hernia.  Patient follows with hematologist Dr. Alena Bills for management of iron deficiency anemia.  She has previously required iron infusions with normalization of hemoglobin in February 2024.  Preop labs notable for hemoglobin 8.5 which is down from 10.5 on 11/28/2022.  I called the patient to discuss these results and advised she reach out to Dr. Alena Bills regarding ongoing management of her anemia.  Patient stated she would reach out to their office today 01/26/2023.  She also stated she had not been taking her oral iron as prescribed, but restarted on 01/21/2023.  I also spoke with Dr. Diamantina Providence surgical scheduler; Dr. August Saucer advised that he expects minimal blood loss and is okay to proceed as planned.  Reported history of celiac disease, however celiac panel and duodenal biopsies were negative.  Remainder of preop labs unremarkable.  EKG 11/28/2022: Normal sinus rhythm.  Rate 78. Cannot rule out Inferior infarct , age undetermined. Cannot rule out Anterior infarct (cited on or before 19-Mar-2019)  Stress echo 03/29/2019 (Care Everywhere): INTERPRETATION  Normal Stress Echocardiogram  NORMAL RIGHT VENTRICULAR SYSTOLIC FUNCTION  MILD MITRAL VALVE REGURGITATION NO VALVULAR STENOSIS NOTED   )        Anesthesia Quick Evaluation

## 2023-01-26 NOTE — Progress Notes (Signed)
Anesthesia Chart Review:  57 year old female with pertinent history including GERD, migraines, HTN, iron deficiency anemia, large hiatal hernia.  Patient follows with hematologist Dr. Alena Bills for management of iron deficiency anemia.  She has previously required iron infusions with normalization of hemoglobin in February 2024.  Preop labs notable for hemoglobin 8.5 which is down from 10.5 on 11/28/2022.  I called the patient to discuss these results and advised she reach out to Dr. Alena Bills regarding ongoing management of her anemia.  Patient stated she would reach out to their office today 01/26/2023.  She also stated she had not been taking her oral iron as prescribed, but restarted on 01/21/2023.  I also spoke with Dr. Diamantina Providence surgical scheduler; Dr. August Saucer advised that he expects minimal blood loss and is okay to proceed as planned.  Reported history of celiac disease, however celiac panel and duodenal biopsies were negative.  Remainder of preop labs unremarkable.  EKG 11/28/2022: Normal sinus rhythm.  Rate 78. Cannot rule out Inferior infarct , age undetermined. Cannot rule out Anterior infarct (cited on or before 19-Mar-2019)  Stress echo 03/29/2019 (Care Everywhere): INTERPRETATION  Normal Stress Echocardiogram  NORMAL RIGHT VENTRICULAR SYSTOLIC FUNCTION  MILD MITRAL VALVE REGURGITATION NO VALVULAR STENOSIS NOTED     Zannie Cove [facility] Short Stay Center/Anesthesiology Phone [phone] 01/26/2023 9:34 AM

## 2023-01-27 ENCOUNTER — Encounter (HOSPITAL_COMMUNITY)
Admission: RE | Disposition: A | Discharge status: Home / Self Care | Payer: Self-pay | Source: Home / Self Care | Attending: Orthopedic Surgery

## 2023-01-27 ENCOUNTER — Ambulatory Visit (HOSPITAL_COMMUNITY)
Admission: RE | Admit: 2023-01-27 | Discharge: 2023-01-27 | Disposition: A | Discharge status: Home / Self Care | Payer: 59 | Attending: Orthopedic Surgery | Admitting: Orthopedic Surgery

## 2023-01-27 ENCOUNTER — Ambulatory Visit (HOSPITAL_BASED_OUTPATIENT_CLINIC_OR_DEPARTMENT_OTHER): Payer: 59 | Admitting: Anesthesiology

## 2023-01-27 ENCOUNTER — Ambulatory Visit (HOSPITAL_COMMUNITY): Payer: 59 | Admitting: Physician Assistant

## 2023-01-27 ENCOUNTER — Encounter (HOSPITAL_COMMUNITY): Payer: Self-pay | Admitting: Orthopedic Surgery

## 2023-01-27 ENCOUNTER — Other Ambulatory Visit: Payer: Self-pay

## 2023-01-27 DIAGNOSIS — I10 Essential (primary) hypertension: Secondary | ICD-10-CM | POA: Diagnosis not present

## 2023-01-27 DIAGNOSIS — D649 Anemia, unspecified: Secondary | ICD-10-CM | POA: Insufficient documentation

## 2023-01-27 DIAGNOSIS — M75122 Complete rotator cuff tear or rupture of left shoulder, not specified as traumatic: Secondary | ICD-10-CM

## 2023-01-27 DIAGNOSIS — F32A Depression, unspecified: Secondary | ICD-10-CM | POA: Insufficient documentation

## 2023-01-27 DIAGNOSIS — K900 Celiac disease: Secondary | ICD-10-CM | POA: Insufficient documentation

## 2023-01-27 DIAGNOSIS — K219 Gastro-esophageal reflux disease without esophagitis: Secondary | ICD-10-CM | POA: Insufficient documentation

## 2023-01-27 DIAGNOSIS — M75102 Unspecified rotator cuff tear or rupture of left shoulder, not specified as traumatic: Secondary | ICD-10-CM | POA: Diagnosis present

## 2023-01-27 DIAGNOSIS — M7522 Bicipital tendinitis, left shoulder: Secondary | ICD-10-CM | POA: Diagnosis not present

## 2023-01-27 DIAGNOSIS — M19012 Primary osteoarthritis, left shoulder: Secondary | ICD-10-CM | POA: Diagnosis not present

## 2023-01-27 DIAGNOSIS — D759 Disease of blood and blood-forming organs, unspecified: Secondary | ICD-10-CM | POA: Insufficient documentation

## 2023-01-27 DIAGNOSIS — Z01818 Encounter for other preprocedural examination: Secondary | ICD-10-CM

## 2023-01-27 HISTORY — PX: BICEPT TENODESIS: SHX5116

## 2023-01-27 HISTORY — PX: SHOULDER ARTHROSCOPY WITH OPEN ROTATOR CUFF REPAIR AND DISTAL CLAVICLE ACROMINECTOMY: SHX5683

## 2023-01-27 SURGERY — SHOULDER ARTHROSCOPY WITH OPEN ROTATOR CUFF REPAIR AND DISTAL CLAVICLE ACROMINECTOMY
Anesthesia: General | Site: Shoulder | Laterality: Left

## 2023-01-27 MED ORDER — FENTANYL CITRATE (PF) 250 MCG/5ML IJ SOLN
INTRAMUSCULAR | Status: AC
  Filled 2023-01-27: qty 5

## 2023-01-27 MED ORDER — 0.9 % SODIUM CHLORIDE (POUR BTL) OPTIME
TOPICAL | Status: DC | PRN
Start: 2023-01-27 — End: 2023-01-27
  Administered 2023-01-27: 1000 mL

## 2023-01-27 MED ORDER — EPINEPHRINE PF 1 MG/ML IJ SOLN
INTRAMUSCULAR | Status: AC
  Filled 2023-01-27: qty 3

## 2023-01-27 MED ORDER — SODIUM CHLORIDE 0.9% FLUSH: 3.0000 mL | Freq: Two times a day (BID) | INTRAVENOUS | Status: DC

## 2023-01-27 MED ORDER — DROPERIDOL 2.5 MG/ML IJ SOLN
INTRAMUSCULAR | Status: AC
  Filled 2023-01-27: qty 2

## 2023-01-27 MED ORDER — EPINEPHRINE PF 1 MG/ML IJ SOLN
INTRAMUSCULAR | Status: DC | PRN
  Administered 2023-01-27 (×2): 1 mg
  Administered 2023-01-27: .15 mg via INTRAMUSCULAR

## 2023-01-27 MED ORDER — DROPERIDOL 2.5 MG/ML IJ SOLN: 0.6250 mg | Freq: Once | INTRAMUSCULAR | Status: DC | PRN

## 2023-01-27 MED ORDER — METHOCARBAMOL 500 MG PO TABS: 500.0000 mg | ORAL_TABLET | Freq: Three times a day (TID) | ORAL | 1 refills | Status: DC | PRN

## 2023-01-27 MED ORDER — CELECOXIB 100 MG PO CAPS
100.0000 mg | ORAL_CAPSULE | Freq: Two times a day (BID) | ORAL | 0 refills | Status: DC
Start: 2023-01-27 — End: 2023-02-23

## 2023-01-27 MED ORDER — BUPIVACAINE HCL (PF) 0.25 % IJ SOLN
INTRAMUSCULAR | Status: AC
  Filled 2023-01-27: qty 30

## 2023-01-27 MED ORDER — LACTATED RINGERS IV SOLN: INTRAVENOUS | Status: DC

## 2023-01-27 MED ORDER — PHENYLEPHRINE 80 MCG/ML (10ML) SYRINGE FOR IV PUSH (FOR BLOOD PRESSURE SUPPORT)
PREFILLED_SYRINGE | INTRAVENOUS | Status: DC | PRN
  Administered 2023-01-27: 80 ug via INTRAVENOUS

## 2023-01-27 MED ORDER — SODIUM CHLORIDE 0.9 % IR SOLN
Status: DC | PRN
  Administered 2023-01-27 (×2): 3000 mL

## 2023-01-27 MED ORDER — OXYCODONE HCL 5 MG PO TABS
5.0000 mg | ORAL_TABLET | ORAL | 0 refills | Status: DC | PRN
Start: 2023-01-27 — End: 2023-02-04

## 2023-01-27 MED ORDER — LACTATED RINGERS IV SOLN
INTRAVENOUS | Status: DC | PRN
Start: 2023-01-27 — End: 2023-01-27

## 2023-01-27 MED ORDER — LIDOCAINE 2% (20 MG/ML) 5 ML SYRINGE
INTRAMUSCULAR | Status: DC | PRN
  Administered 2023-01-27: 100 mg via INTRAVENOUS

## 2023-01-27 MED ORDER — CEFAZOLIN SODIUM-DEXTROSE 2-4 GM/100ML-% IV SOLN
2.0000 g | INTRAVENOUS | Status: AC
  Administered 2023-01-27: 2 g via INTRAVENOUS
  Filled 2023-01-27: qty 100

## 2023-01-27 MED ORDER — DROPERIDOL 2.5 MG/ML IJ SOLN
INTRAMUSCULAR | Status: DC | PRN
Start: 2023-01-27 — End: 2023-01-27
  Administered 2023-01-27: .625 mg via INTRAVENOUS

## 2023-01-27 MED ORDER — BUPIVACAINE LIPOSOME 1.3 % IJ SUSP
INTRAMUSCULAR | Status: DC | PRN
Start: 2023-01-27 — End: 2023-01-27
  Administered 2023-01-27: 10 mL via PERINEURAL

## 2023-01-27 MED ORDER — PHENYLEPHRINE HCL-NACL 20-0.9 MG/250ML-% IV SOLN
INTRAVENOUS | Status: DC | PRN
  Administered 2023-01-27: 30 ug/min via INTRAVENOUS
  Administered 2023-01-27: 50 ug/min via INTRAVENOUS

## 2023-01-27 MED ORDER — FENTANYL CITRATE (PF) 250 MCG/5ML IJ SOLN
INTRAMUSCULAR | Status: DC | PRN
  Administered 2023-01-27 (×2): 50 ug via INTRAVENOUS

## 2023-01-27 MED ORDER — MIDAZOLAM HCL 2 MG/2ML IJ SOLN
INTRAMUSCULAR | Status: DC | PRN
  Administered 2023-01-27 (×2): 1 mg via INTRAVENOUS

## 2023-01-27 MED ORDER — PROPOFOL 10 MG/ML IV BOLUS
INTRAVENOUS | Status: DC | PRN
  Administered 2023-01-27: 130 mg via INTRAVENOUS

## 2023-01-27 MED ORDER — MIDAZOLAM HCL 2 MG/2ML IJ SOLN
INTRAMUSCULAR | Status: AC
  Filled 2023-01-27: qty 2

## 2023-01-27 MED ORDER — BUPIVACAINE-EPINEPHRINE (PF) 0.5% -1:200000 IJ SOLN
INTRAMUSCULAR | Status: DC | PRN
Start: 2023-01-27 — End: 2023-01-27
  Administered 2023-01-27: 15 mL via PERINEURAL

## 2023-01-27 MED ORDER — CHLORHEXIDINE GLUCONATE 0.12 % MT SOLN
15.0000 mL | Freq: Once | OROMUCOSAL | Status: AC
  Administered 2023-01-27: 15 mL via OROMUCOSAL
  Filled 2023-01-27: qty 15

## 2023-01-27 MED ORDER — FENTANYL CITRATE (PF) 100 MCG/2ML IJ SOLN: 25.0000 ug | INTRAMUSCULAR | Status: DC | PRN

## 2023-01-27 MED ORDER — EPINEPHRINE PF 1 MG/ML IJ SOLN
INTRAMUSCULAR | Status: AC
  Filled 2023-01-27: qty 1

## 2023-01-27 MED ORDER — ALBUMIN HUMAN 5 % IV SOLN
INTRAVENOUS | Status: DC | PRN
Start: 2023-01-27 — End: 2023-01-27

## 2023-01-27 MED ORDER — BUPIVACAINE LIPOSOME 1.3 % IJ SUSP
INTRAMUSCULAR | Status: AC
  Filled 2023-01-27: qty 10

## 2023-01-27 MED ORDER — TRANEXAMIC ACID-NACL 1000-0.7 MG/100ML-% IV SOLN
1000.0000 mg | INTRAVENOUS | Status: AC
  Administered 2023-01-27: 1000 mg via INTRAVENOUS
  Filled 2023-01-27: qty 100

## 2023-01-27 MED ORDER — OXYCODONE HCL 5 MG PO TABS: 5.0000 mg | ORAL_TABLET | Freq: Once | ORAL | Status: DC | PRN

## 2023-01-27 MED ORDER — ROCURONIUM BROMIDE 10 MG/ML (PF) SYRINGE
PREFILLED_SYRINGE | INTRAVENOUS | Status: DC | PRN
  Administered 2023-01-27: 60 mg via INTRAVENOUS

## 2023-01-27 MED ORDER — OXYCODONE HCL 5 MG/5ML PO SOLN: 5.0000 mg | Freq: Once | ORAL | Status: DC | PRN

## 2023-01-27 MED ORDER — DEXAMETHASONE SODIUM PHOSPHATE 10 MG/ML IJ SOLN
INTRAMUSCULAR | Status: DC | PRN
  Administered 2023-01-27: 10 mg via INTRAVENOUS

## 2023-01-27 MED ORDER — SCOPOLAMINE 1 MG/3DAYS TD PT72
1.0000 | MEDICATED_PATCH | Freq: Once | TRANSDERMAL | Status: DC
  Administered 2023-01-27: 1.5 mg via TRANSDERMAL
  Filled 2023-01-27: qty 1

## 2023-01-27 MED ORDER — SUGAMMADEX SODIUM 200 MG/2ML IV SOLN
INTRAVENOUS | Status: DC | PRN
  Administered 2023-01-27: 145.2 mg via INTRAVENOUS

## 2023-01-27 MED ORDER — PHENYLEPHRINE HCL-NACL 20-0.9 MG/250ML-% IV SOLN
INTRAVENOUS | Status: AC
  Filled 2023-01-27: qty 250

## 2023-01-27 MED ORDER — ORAL CARE MOUTH RINSE: 15.0000 mL | Freq: Once | OROMUCOSAL | Status: AC

## 2023-01-27 MED ORDER — ACETAMINOPHEN 500 MG PO TABS
1000.0000 mg | ORAL_TABLET | Freq: Once | ORAL | Status: AC
  Administered 2023-01-27: 1000 mg via ORAL
  Filled 2023-01-27: qty 2

## 2023-01-27 MED ORDER — PROPOFOL 10 MG/ML IV BOLUS
INTRAVENOUS | Status: AC
  Filled 2023-01-27: qty 20

## 2023-01-27 SURGICAL SUPPLY — 76 items
AID PSTN UNV HD RSTRNT DISP (MISCELLANEOUS) ×1
ANCH SUT 2 FBRTK KNTLS 1.8 (Anchor) ×1 IMPLANT
ANCH SUT 2 SWLK 19.1 CLS EYLT (Anchor) ×2 IMPLANT
ANCH SUT FBRTK 1.3 2 TPE (Anchor) ×1 IMPLANT
ANCHOR FBRTK 2.6 SUTURETAP 1.3 (Anchor) IMPLANT
ANCHOR SUT 1.8 FIBERTAK SB KL (Anchor) IMPLANT
ANCHOR SWIVELOCK BIO 4.75X19.1 (Anchor) IMPLANT
BAG COUNTER SPONGE SURGICOUNT (BAG) ×1 IMPLANT
BAG SPNG CNTER NS LX DISP (BAG)
BLADE EXCALIBUR 4.0X13 (MISCELLANEOUS) ×1 IMPLANT
BLADE SHAVER TORPEDO 4X13 (MISCELLANEOUS) IMPLANT
BLADE SURG 11 STRL SS (BLADE) ×1 IMPLANT
COVER SURGICAL LIGHT HANDLE (MISCELLANEOUS) ×1 IMPLANT
DRAPE INCISE IOBAN 66X45 STRL (DRAPES) ×2 IMPLANT
DRAPE STERI 35X30 U-POUCH (DRAPES) ×1 IMPLANT
DRAPE U-SHAPE 47X51 STRL (DRAPES) ×2 IMPLANT
DRSG TEGADERM 4X4.75 (GAUZE/BANDAGES/DRESSINGS) ×4 IMPLANT
DURAPREP 26ML APPLICATOR (WOUND CARE) ×1 IMPLANT
ELECT REM PT RETURN 9FT ADLT (ELECTROSURGICAL) ×1
ELECTRODE REM PT RTRN 9FT ADLT (ELECTROSURGICAL) ×1 IMPLANT
FILTER STRAW FLUID ASPIR (MISCELLANEOUS) ×1 IMPLANT
GAUZE PAD ABD 8X10 STRL (GAUZE/BANDAGES/DRESSINGS) ×3 IMPLANT
GAUZE SPONGE 4X4 12PLY STRL (GAUZE/BANDAGES/DRESSINGS) IMPLANT
GAUZE SPONGE 4X4 12PLY STRL LF (GAUZE/BANDAGES/DRESSINGS) ×1 IMPLANT
GAUZE XEROFORM 1X8 LF (GAUZE/BANDAGES/DRESSINGS) ×1 IMPLANT
GLOVE BIOGEL PI IND STRL 8 (GLOVE) ×1 IMPLANT
GLOVE ECLIPSE 8.0 STRL XLNG CF (GLOVE) ×1 IMPLANT
GOWN STRL REUS W/ TWL LRG LVL3 (GOWN DISPOSABLE) ×3 IMPLANT
GOWN STRL REUS W/TWL LRG LVL3 (GOWN DISPOSABLE) ×3
KIT BASIN OR (CUSTOM PROCEDURE TRAY) ×1 IMPLANT
KIT TURNOVER KIT B (KITS) ×1 IMPLANT
MANIFOLD NEPTUNE II (INSTRUMENTS) ×1 IMPLANT
NDL HD SCORPION MEGA LOADER (NEEDLE) IMPLANT
NDL HYPO 25X1 1.5 SAFETY (NEEDLE) ×1 IMPLANT
NDL SCORPION MULTI FIRE (NEEDLE) IMPLANT
NDL SPNL 18GX3.5 QUINCKE PK (NEEDLE) ×1 IMPLANT
NDL SUT 2-0 SCORPION KNEE (NEEDLE) IMPLANT
NDL SUT 6 .5 CRC .975X.05 MAYO (NEEDLE) IMPLANT
NEEDLE HYPO 25X1 1.5 SAFETY (NEEDLE) ×2 IMPLANT
NEEDLE MAYO TAPER (NEEDLE)
NEEDLE SCORPION MULTI FIRE (NEEDLE) IMPLANT
NEEDLE SPNL 18GX3.5 QUINCKE PK (NEEDLE) ×1 IMPLANT
NEEDLE SUT 2-0 SCORPION KNEE (NEEDLE) ×1 IMPLANT
NS IRRIG 1000ML POUR BTL (IV SOLUTION) ×1 IMPLANT
PACK SHOULDER (CUSTOM PROCEDURE TRAY) ×1 IMPLANT
PAD ARMBOARD 7.5X6 YLW CONV (MISCELLANEOUS) ×2 IMPLANT
PROBE APOLLO 90XL (SURGICAL WAND) ×1 IMPLANT
RESTRAINT HEAD UNIVERSAL NS (MISCELLANEOUS) ×1 IMPLANT
SLING ARM IMMOBILIZER LRG (SOFTGOODS) IMPLANT
SLING ARM IMMOBILIZER MED (SOFTGOODS) IMPLANT
SPONGE T-LAP 4X18 ~~LOC~~+RFID (SPONGE) ×2 IMPLANT
STRIP CLOSURE SKIN 1/2X4 (GAUZE/BANDAGES/DRESSINGS) ×1 IMPLANT
SUCTION TUBE FRAZIER 10FR DISP (SUCTIONS) ×1 IMPLANT
SUT 0 FIBERLOOP 38 BLUE TPR ND (SUTURE) ×3
SUT ETHILON 3 0 PS 1 (SUTURE) ×1 IMPLANT
SUT FIBERWIRE #2 38 T-5 BLUE (SUTURE)
SUT MNCRL AB 3-0 PS2 18 (SUTURE) ×1 IMPLANT
SUT VIC AB 0 CT1 27 (SUTURE) ×1
SUT VIC AB 0 CT1 27XBRD ANBCTR (SUTURE) ×1 IMPLANT
SUT VIC AB 0 CT1 36 (SUTURE) IMPLANT
SUT VIC AB 1 CT1 27 (SUTURE) ×2
SUT VIC AB 1 CT1 27XBRD ANBCTR (SUTURE) ×1 IMPLANT
SUT VIC AB 2-0 CT1 27 (SUTURE) ×2
SUT VIC AB 2-0 CT1 TAPERPNT 27 (SUTURE) ×1 IMPLANT
SUT VICRYL 0 UR6 27IN ABS (SUTURE) IMPLANT
SUTURE 0 FIBERLP 38 BLU TPR ND (SUTURE) IMPLANT
SUTURE FIBERWR #2 38 T-5 BLUE (SUTURE) IMPLANT
SYR 20ML LL LF (SYRINGE) ×2 IMPLANT
SYR 3ML LL SCALE MARK (SYRINGE) ×1 IMPLANT
SYR TB 1ML LUER SLIP (SYRINGE) ×1 IMPLANT
SYS FBRTK BUTTON 2.6 (Anchor) ×1 IMPLANT
SYSTEM FBRTK BUTTON 2.6 (Anchor) IMPLANT
TOWEL GREEN STERILE (TOWEL DISPOSABLE) ×1 IMPLANT
TOWEL GREEN STERILE FF (TOWEL DISPOSABLE) ×1 IMPLANT
TUBING ARTHROSCOPY IRRIG 16FT (MISCELLANEOUS) ×1 IMPLANT
WATER STERILE IRR 1000ML POUR (IV SOLUTION) ×1 IMPLANT

## 2023-01-27 NOTE — Op Note (Signed)
[name], [name] MEDICAL RECORD NO: [id number] ACCOUNT NO: [id number] DATE OF BIRTH: 1966/03/20 FACILITY: [facility] LOCATION: [facility] PHYSICIAN: Graylin Shiver. August Saucer, MD  Operative Report   DATE OF PROCEDURE: 01/27/2023  PREOPERATIVE DIAGNOSIS:  Left shoulder rotator cuff tear, biceps tendinitis, AC joint arthritis and osteolysis.  POSTOPERATIVE DIAGNOSIS:  Left shoulder rotator cuff tear, biceps tendinitis, AC joint arthritis and osteolysis.  PROCEDURE:  Left shoulder arthroscopy with debridement of the superior labrum release of the biceps tendon, debridement of rotator cuff tear and arthroscopic distal clavicle excision with mini open biceps tenodesis and rotator cuff tear repair of an  atypical muscle tendon junction tear of the supraspinatus.  SURGEON:  Graylin Shiver. August Saucer, MD  ASSISTANT:  Karenann Cai, PA.  INDICATIONS:  This is a 57 year old patient with rotator cuff tear, biceps tendinitis and AC joint arthritis, who presents for operative management after explanation of risks and benefits.  DESCRIPTION OF PROCEDURE:  The patient was brought to the operating room where general endotracheal anesthesia was induced.  Preoperative antibiotics administered.  Timeout was called.  The patient was placed in the beach chair position with head in  neutral position.  Left shoulder, arm and hand examined under anesthesia.  The patient had about 65 degrees of external rotation, 95 of isolated glenohumeral abduction and 170 of forward flexion.  Left shoulder, arm and hand was then pre-scrubbed with  alcohol and Betadine, which was allowed to air dry then prepped with DuraPrep solution and draped in sterile manner.  Ioban used to seal the operative field and cover the axilla.  After calling timeout, the outline of the clavicle was determined using a  25 gauge needle.  Posterior portal was then created 2 cm medial and inferior to the posterolateral margin of acromion.  Diagnostic arthroscopy was performed.   Anterior portal created under direct visualization, but this was in line with the anticipated  location of the portal required to resect the distal clavicle.  The patient's glenohumeral articular surfaces were intact.  There was significant synovitis within the shoulder joint, which was debrided.  The patient also had a tear through the muscle  tendon junction of the supraspinatus tendon.  There was also delamination superiorly at the attachment site.  Next, with the Arthrocare wand, the biceps tendon was released and the superior labrum debrided.  A torn rotator cuff was also debrided using a  shaver.  At this time, scope placed into the subacromial space, we did place a solution of 10 mL of saline with 0.15 mL of epinephrine into that space.  Arthrocare wand demonstrated some osteolysis of the distal clavicle, but this was debrided back about  2 or 3 more millimeters maintaining the posterior and superior ligaments.  That was done with the bur.  At this time, the rotator cuff tear was also identified and debrided.  The patient did have about 2 x 2 cm tear just anterior to the rotator  interval.  At this time, instruments were removed, and the portals were closed using 3-0 nylon.  Ioban used to cover the operative field.  Incision made off the anterolateral margin of the acromion.  Skin and subcutaneous tissue sharply divided.  Deltoid  split a measured distance of 4 cm from the anterolateral margin of the acromion.  Deltoid was split between the anterior middle raphae.  Bursectomy performed.  Self-retaining retractor placed.  Biceps tendon was then tenodesed after placing in a  FiberLoop suture.  Tenodesed under appropriate tension.  Done with two knotless suture  anchors from Arthrex in the proximal and distal aspect of the bicipital groove.  Oversewn with Vicryl x1.  Secure, appropriately tensioned.  Construct was achieved.   Next, attention was directed towards the rotator cuff tear.  Acromioplasty  performed with preservation of the CA ligament.  Rotator cuff tear was an atypical tear through the muscle tendon junction with some delamination of the supraspinatus anteriorly.   A 3-0 Vicryl sutures were placed with a knee Scorpion in order to create a side-to-side repair of that tissue.  Next, the delaminated portion of the tear was closed after placing 1 medial row anchor into the tuberosity at the anterior supraspinatus  attachment.  The 0 FiberWire sutures were tied and then the 2 suture tapes from the anchor were tied, creating a nice side to side repair of the delaminated tear.  Next, the suture limbs were split and placed into a SwiveLock one of which went anterior  to the bicipital groove and the other posterior to the bicipital groove to create a nice seal repair.  External rotation at the conclusion of the repair was 60 degrees.  Thorough irrigation was performed.  Deltoid split closed using #1 Vicryl suture  followed by interrupted inverted 0 Vicryl suture, 2-0 Vicryl suture, and 3-0 Monocryl with Steri-Strips applied and impervious dressings applied.  The patient tolerated the procedure well without immediate complications.  Sling also utilized.  [name]'s  assistance was required at all times for retraction, opening, closing, mobilization of tissue.  His assistance was a medical necessity.   PUS D: 01/27/2023 10:50:55 am T: 01/27/2023 11:06:00 am  JOB: [id number]/ [id number]

## 2023-01-27 NOTE — Anesthesia Procedure Notes (Signed)
Anesthesia Regional Block: Interscalene brachial plexus block   Pre-Anesthetic Checklist: , timeout performed,  Correct Patient, Correct Site, Correct Laterality,  Correct Procedure, Correct Position, site marked,  Risks and benefits discussed,  Pre-op evaluation,  At surgeon's request and post-op pain management  Laterality: Left  Prep: Maximum Sterile Barrier Precautions used, chloraprep       Needles:  Injection technique: Single-shot  Needle Type: Echogenic Stimulator Needle     Needle Length: 9cm  Needle Gauge: 22     Additional Needles:   Procedures:,,,, ultrasound used (permanent image in chart),,    Narrative:  Start time: 01/27/2023 7:04 AM End time: 01/27/2023 7:07 AM Injection made incrementally with aspirations every 5 mL.  Performed by: Personally  Anesthesiologist: Kaylyn Layer, MD  Additional Notes: Risks, benefits, and alternative discussed. Patient gave consent for procedure. Patient prepped and draped in sterile fashion. Sedation administered, patient remains easily responsive to voice. Relevant anatomy identified with ultrasound guidance. Local anesthetic given in 5cc increments with no signs or symptoms of intravascular injection. No pain or paraesthesias with injection. Patient monitored throughout procedure with signs of LAST or immediate complications. Tolerated well. Ultrasound image placed in chart.  Ariel Greenhouse, MD

## 2023-01-27 NOTE — Anesthesia Procedure Notes (Signed)
Procedure Name: Intubation Date/Time: 01/27/2023 7:51 AM  Performed by: Loleta Altin Sease, CRNAPre-anesthesia Checklist: Patient identified, Patient being monitored, Timeout performed, Emergency Drugs available and Suction available Patient Re-evaluated:Patient Re-evaluated prior to induction Oxygen Delivery Method: Circle system utilized Preoxygenation: Pre-oxygenation with 100% oxygen Induction Type: IV induction Ventilation: Mask ventilation without difficulty Laryngoscope Size: Mac and 3 Grade View: Grade I Tube type: Oral Tube size: 7.0 mm Number of attempts: 1 Airway Equipment and Method: Stylet Placement Confirmation: ETT inserted through vocal cords under direct vision, positive ETCO2 and breath sounds checked- equal and bilateral Secured at: 22 cm Tube secured with: Tape Dental Injury: Teeth and Oropharynx as per pre-operative assessment

## 2023-01-27 NOTE — Brief Op Note (Signed)
   01/27/2023  10:43 AM  PATIENT:  [name]  57 y.o. female  PRE-OPERATIVE DIAGNOSIS:  left shoulder rotator cuff tear, biceps tendonitis, acromioclavicular osteoarthritis  POST-OPERATIVE DIAGNOSIS:  left shoulder rotator cuff tear, biceps tendonitis, acromioclavicular osteoarthritis  PROCEDURE:  Procedure(s): LEFT SHOULDER ARTHROSCOPY, DEBRIDEMENT, arthroscopic DISTAL CLAVICLE EXCISION, MINI OPEN ROTATOR CUFF TEAR REPAIR BICEPS TENODESIS  SURGEON:  Surgeon(s): August Saucer, Corrie Mckusick, MD  ASSISTANT: Karenann Cai, PA  ANESTHESIA:   General  EBL: 25 ml    Total I/O In: 350 [IV Piggyback:350] Out: -   BLOOD ADMINISTERED: none  DRAINS: None  LOCAL MEDICATIONS USED:  none  SPECIMEN:  No Specimen  COUNTS:  YES  TOURNIQUET:  * No tourniquets in log *  DICTATION: .Other Dictation: Dictation Number [id number]  PLAN OF CARE: Discharge to home after PACU  PATIENT DISPOSITION:  PACU - hemodynamically stable

## 2023-01-27 NOTE — Transfer of Care (Signed)
Immediate Anesthesia Transfer of Care Note  Patient: [name]  Procedure(s) Performed: LEFT SHOULDER ARTHROSCOPY, DEBRIDEMENT, DISTAL CLAVICLE EXCISION, MINI OPEN ROTATOR CUFF TEAR REPAIR (Left) BICEPS TENODESIS (Left)  Patient Location: PACU  Anesthesia Type:General  Level of Consciousness: awake  Airway & Oxygen Therapy: Patient Spontanous Breathing and Patient connected to nasal cannula oxygen  Post-op Assessment: Report given to RN and Post -op Vital signs reviewed and stable  Post vital signs: Reviewed and stable  Last Vitals:  Vitals Value Taken Time  BP 134/66 01/27/23 1100  Temp 36.7 C 01/27/23 1058  Pulse 90 01/27/23 1101  Resp 20 01/27/23 1101  SpO2 99 % 01/27/23 1101  Vitals shown include unfiled device data.  Last Pain:  Vitals:   01/27/23 0617  TempSrc:   PainSc: 3       Patients Stated Pain Goal: 1 (01/27/23 0617)  Complications: No notable events documented.

## 2023-01-27 NOTE — Anesthesia Postprocedure Evaluation (Signed)
Anesthesia Post Note  Patient: [name]  Procedure(s) Performed: LEFT SHOULDER ARTHROSCOPY, DEBRIDEMENT, DISTAL CLAVICLE EXCISION, MINI OPEN ROTATOR CUFF TEAR REPAIR (Left: Shoulder) BICEPS TENODESIS (Left: Shoulder)     Patient location during evaluation: PACU Anesthesia Type: General Level of consciousness: awake and alert Pain management: pain level controlled Vital Signs Assessment: post-procedure vital signs reviewed and stable Respiratory status: spontaneous breathing, nonlabored ventilation and respiratory function stable Cardiovascular status: blood pressure returned to baseline Postop Assessment: no apparent nausea or vomiting Anesthetic complications: no   No notable events documented.  Last Vitals:  Vitals:   01/27/23 1100 01/27/23 1115  BP: 125/60 132/67  Pulse: 93 95  Resp: 14 16  Temp:    SpO2: 98% 96%    Last Pain:  Vitals:   01/27/23 1115  TempSrc:   PainSc: 0-No pain                 Shanda Howells

## 2023-01-27 NOTE — H&P (Signed)
[name] is an 57 y.o. female.   Chief Complaint: left shoulder pain HPI:  [name] is a 57 y.o. female who presents  reporting left shoulder pain.  Patient had a glenohumeral joint injection 11/19/2022 which gave her only marginal relief.  She has had pain for several months.  Symptoms are getting worse.  Pain radiates to her biceps.  She only had about 1 to 2 days of relief from her glenohumeral joint injection.  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  Does describe a lot of popping in the left shoulder.  Entire shoulder region hurts.  Does take Tylenol ibuprofen and Aleve without much help.  Tramadol also not very helpful.  She has had an MRI scan which is reviewed with the patient.  This does show full-thickness tear of the supraspinatus anteriorly in the region of the myotendinous junction.  Distal tendon insertion intact with moderate tendinosis but no tear.  There is some focal tendinosis of the long head of the biceps tendons.  Also there is widening of the [facility] joint with disruption of the inferior acromioclavicular ligament..    Past Medical History:  Diagnosis Date   Anemia    BV (bacterial vaginosis) 11/27/2012   Celiac disease    Cough due to ACE inhibitor 04/25/2019   Depression    Essential hypertension    Family history of adverse reaction to anesthesia    MOM-HARD TIME WAKING UP   GERD (gastroesophageal reflux disease)    Migraine with visual aura    MIGRAINES   UTI (lower urinary tract infection)     Past Surgical History:  Procedure Laterality Date   ABDOMINAL HYSTERECTOMY     BLADDER SUSPENSION     COLONOSCOPY WITH PROPOFOL N/A 05/11/2022   Procedure: COLONOSCOPY WITH PROPOFOL;  Surgeon: Wyline Mood, MD;  Location: [facility] ENDOSCOPY;  Service: Gastroenterology;  Laterality: N/A;   ESOPHAGOGASTRODUODENOSCOPY (EGD) WITH PROPOFOL N/A 05/11/2022   Procedure: ESOPHAGOGASTRODUODENOSCOPY  (EGD) WITH PROPOFOL;  Surgeon: Wyline Mood, MD;  Location: [facility] ENDOSCOPY;  Service: Gastroenterology;  Laterality: N/A;   EXPLORATORY LAPAROTOMY     IUD REMOVAL  07/25/2017   Procedure: INTRAUTERINE DEVICE (IUD) REMOVAL;  Surgeon: Linzie Collin, MD;  Location: [facility] ORS;  Service: Gynecology;;   LAPAROSCOPIC ASSISTED VAGINAL HYSTERECTOMY Bilateral 07/25/2017   Procedure: LAPAROSCOPIC ASSISTED VAGINAL HYSTERECTOMY WITH BILATERAL SALPING OOPHERECTOMY;  Surgeon: Linzie Collin, MD;  Location: [facility] ORS;  Service: Gynecology;  Laterality: Bilateral;   TUBAL LIGATION      Family History  Problem Relation Age of Onset   Hypertension Mother    Stroke Mother    Cancer Father    Hypertension Maternal Grandmother    Colon cancer Maternal Grandmother    Breast cancer Maternal Grandmother    Skin cancer Maternal Grandmother    Hypertension Maternal Grandfather    Hypertension Paternal Grandmother    Diabetes Paternal Grandmother    Hypertension Paternal Grandfather    Diabetes Son 12       type 1   Social History:  reports that she has never smoked. She has never used smokeless tobacco. She reports that she does not drink alcohol and does not use drugs.  Allergies:  Allergies  Allergen Reactions   Topamax [Topiramate]     "cant function"    Tramadol     Cant function with it    Gluten Meal Diarrhea and Nausea Only  Ondansetron Other (See Comments)    Helps her nausea, but makes HA worse    Medications Prior to Admission  Medication Sig Dispense Refill   atorvastatin (LIPITOR) 20 MG tablet Take 1 tablet (20 mg total) by mouth every evening. For cholesterol. 90 tablet 3   FLUoxetine (PROZAC) 20 MG capsule Take 20 mg by mouth daily. Takes with 40 mg to = 60 mg daily     FLUoxetine (PROZAC) 40 MG capsule Take 1 capsule (40 mg total) by mouth daily. Take total of 60 mg daily. Take along with 20 mg cap 90 capsule 0   losartan (COZAAR) 50 MG tablet TAKE 1 TABLET (50 MG TOTAL)  BY MOUTH DAILY. FOR BLOOD PRESSURE. 90 tablet 0   pantoprazole (PROTONIX) 40 MG tablet TAKE 1 TABLET (40 MG TOTAL) BY MOUTH DAILY. FOR HEARTBURN. (Patient taking differently: Take 40 mg by mouth 2 (two) times daily. For heartburn.) 90 tablet 0   ferrous sulfate 325 (65 FE) MG EC tablet Take 325 mg by mouth 3 (three) times daily with meals.      No results found for this or any previous visit (from the past 48 hour(s)). No results found.  Review of Systems  Musculoskeletal:  Positive for arthralgias.  All other systems reviewed and are negative.   Blood pressure 135/76, pulse 80, temperature 98.3 F (36.8 C), temperature source Oral, resp. rate 18, height 5' (1.524 m), weight 72.6 kg, SpO2 95%. Physical Exam Vitals reviewed.  HENT:     Head: Normocephalic.     Nose: Nose normal.     Mouth/Throat:     Mouth: Mucous membranes are moist.  Eyes:     Pupils: Pupils are equal, round, and reactive to light.  Cardiovascular:     Rate and Rhythm: Normal rate.     Pulses: Normal pulses.  Pulmonary:     Effort: Pulmonary effort is normal.  Abdominal:     General: Abdomen is flat.  Musculoskeletal:     Cervical back: Normal range of motion.  Skin:    General: Skin is warm.     Capillary Refill: Capillary refill takes less than 2 seconds.  Neurological:     General: No focal deficit present.     Mental Status: She is alert.  Psychiatric:        Mood and Affect: Mood normal.    Ortho exam demonstrates painful range of motion with forward flexion and abduction. Pretty reasonable strength to external rotation and subscap testing on the left. Does have more tenderness in the left AC joint compared to the right. No masses lymphadenopathy or skin changes noted in the shoulder girdle region. Negative apprehension relocation testing cervical spine range of motion full. Motor or sensory function of the left arm and hand intact. Does have tenderness to the bicipital groove. O'Brien's testing  positive on the left negative on the right. No asymmetric loss of external rotation left vs right  Assessment/Plan Impression is left shoulder pain with multiple pain generators in the shoulder based on exam and MRI scanning.  That includes this myotendinous junction tear of the supraspinatus along with biceps tendon as well as AC joint.  These are also pain generators on exam.  Plan is shoulder arthroscopy with debridement biceps tendon release biceps tenodesis distal clavicle excision arthroscopically and the mini open rotator cuff tear repair possibly with the patch.  It does not guarantee that we will be able to affect a meaningful repair of the rotator cuff tendon in  that location.  In fact it is more of a tear in the muscle tendon interface.  Nonetheless she cannot really get on like she is going and she has failed conservative treatment.  The risks and benefits of surgery are discussed including not limited to infection nerve or vessel damage incomplete pain relief as well as incomplete restoration of function.  Patient understands risk and benefits and wishes to proceed.  Expected rehabilitation time also discussed.  Minimum 3 months for her to get back to physical work.  All questions answered   Burnard Bunting, MD 01/27/2023, 6:12 AM

## 2023-01-28 ENCOUNTER — Encounter (HOSPITAL_COMMUNITY): Payer: Self-pay | Admitting: Orthopedic Surgery

## 2023-01-31 NOTE — Plan of Care (Signed)
[facility] Tonsillectomy/Adenoidectomy, Postoperative PEDS care plan entered in error.

## 2023-02-04 ENCOUNTER — Telehealth: Payer: Self-pay | Admitting: Orthopedic Surgery

## 2023-02-04 ENCOUNTER — Ambulatory Visit (INDEPENDENT_AMBULATORY_CARE_PROVIDER_SITE_OTHER): Payer: 59 | Admitting: Surgical

## 2023-02-04 ENCOUNTER — Encounter: Payer: Self-pay | Admitting: Surgical

## 2023-02-04 DIAGNOSIS — M75122 Complete rotator cuff tear or rupture of left shoulder, not specified as traumatic: Secondary | ICD-10-CM

## 2023-02-04 MED ORDER — OXYCODONE HCL 5 MG PO TABS: 5.0000 mg | ORAL_TABLET | ORAL | 0 refills | Status: DC | PRN

## 2023-02-04 NOTE — Progress Notes (Signed)
Post-Op Visit Note   Patient: [name]           Date of Birth: 11/04/1965           MRN: [id number] Visit Date: 02/04/2023 PCP: Doreene Nest, NP   Assessment & Plan:  Chief Complaint:  Chief Complaint  Patient presents with   Left Shoulder - Routine Post Op    01/27/23 left shoulder scope   Visit Diagnoses:  1. Complete tear of left rotator cuff, unspecified whether traumatic     Plan: [name] is a 57 y.o. female who presents s/p left shoulder rotator cuff repair and biceps tenodesis on 01/27/2023.  Patient is doing well and pain is overall controlled but moderate to severe at times.  Using CPM machine at least twice a day.  Denies any chest pain, SOB, fevers, chills. Taking pain medication every 4-6 hours.  Has not been doing any lifting.  She is remains in the sling.  On exam, patient has range of motion deferred to next appointment due to patient's pain.  Intact EPL, FPL, finger abduction, finger adduction, pronation/supination, bicep, tricep, deltoid of operative extremity.  Axillary nerve intact with deltoid firing.  Incisions are healing well without evidence of infection or dehiscence.  Sutures removed and replaced with Steri-Strips today.  2+ radial pulse of the operative extremity  Plan is continue with CPM machine.  Oxycodone refilled.  Follow-up in 2 weeks for clinical recheck with Dr. August Saucer and initiation of physical therapy at that point.  Caution patient against any active range of motion of the operative shoulder or lifting with the operative arm..   Follow-Up Instructions: No follow-ups on file.   Orders:  No orders of the defined types were placed in this encounter.  Meds ordered this encounter  Medications   oxyCODONE (ROXICODONE) 5 MG immediate release tablet    Sig: Take 1 tablet (5 mg total) by mouth every 4 (four) hours as needed for severe pain (pain score 7-10).    Dispense:  30 tablet    Refill:  0    Imaging: No results  found.  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) 11/27/2021   Vitamin D deficiency 11/27/2021   Chronic fatigue 11/27/2021   Paranoid delusion (HCC) 11/27/2021   GERD (gastroesophageal reflux disease) 04/25/2019   Hyperlipidemia 03/02/2019   Essential hypertension 02/18/2019   Recurrent cystitis 11/27/2012   Migraine with visual aura 09/19/2011   Chronic constipation 06/25/2011   Past Medical History:  Diagnosis Date   Anemia    BV (bacterial vaginosis) 11/27/2012   Celiac disease    Cough due to ACE inhibitor 04/25/2019   Depression    Essential hypertension    Family history of adverse reaction to anesthesia    MOM-HARD TIME WAKING UP   GERD (gastroesophageal reflux disease)    Migraine with visual aura    MIGRAINES   UTI (lower urinary tract infection)     Family History  Problem Relation Age of Onset   Hypertension Mother    Stroke Mother    Cancer Father    Hypertension Maternal Grandmother    Colon cancer Maternal Grandmother    Breast cancer Maternal Grandmother    Skin  cancer Maternal Grandmother    Hypertension Maternal Grandfather    Hypertension Paternal Grandmother    Diabetes Paternal Grandmother    Hypertension Paternal Grandfather    Diabetes Son 28       type 1    Past Surgical History:  Procedure Laterality Date   ABDOMINAL HYSTERECTOMY     BICEPT TENODESIS Left 01/27/2023   Procedure: BICEPS TENODESIS;  Surgeon: Ariel Copa, MD;  Location: [facility] OR;  Service: Orthopedics;  Laterality: Left;   BLADDER SUSPENSION     COLONOSCOPY WITH PROPOFOL N/A 05/11/2022   Procedure: COLONOSCOPY WITH PROPOFOL;  Surgeon: Ariel Mood, MD;  Location: [facility] ENDOSCOPY;   Service: Gastroenterology;  Laterality: N/A;   ESOPHAGOGASTRODUODENOSCOPY (EGD) WITH PROPOFOL N/A 05/11/2022   Procedure: ESOPHAGOGASTRODUODENOSCOPY (EGD) WITH PROPOFOL;  Surgeon: Ariel Mood, MD;  Location: [facility] ENDOSCOPY;  Service: Gastroenterology;  Laterality: N/A;   EXPLORATORY LAPAROTOMY     IUD REMOVAL  07/25/2017   Procedure: INTRAUTERINE DEVICE (IUD) REMOVAL;  Surgeon: Ariel Collin, MD;  Location: [facility] ORS;  Service: Gynecology;;   LAPAROSCOPIC ASSISTED VAGINAL HYSTERECTOMY Bilateral 07/25/2017   Procedure: LAPAROSCOPIC ASSISTED VAGINAL HYSTERECTOMY WITH BILATERAL SALPING OOPHERECTOMY;  Surgeon: Ariel Collin, MD;  Location: [facility] ORS;  Service: Gynecology;  Laterality: Bilateral;   SHOULDER ARTHROSCOPY WITH OPEN ROTATOR CUFF REPAIR AND DISTAL CLAVICLE ACROMINECTOMY Left 01/27/2023   Procedure: LEFT SHOULDER ARTHROSCOPY, DEBRIDEMENT, DISTAL CLAVICLE EXCISION, MINI OPEN ROTATOR CUFF TEAR REPAIR;  Surgeon: Ariel Copa, MD;  Location: [facility] OR;  Service: Orthopedics;  Laterality: Left;   TUBAL LIGATION     Social History   Occupational History   Occupation: Corporate treasurer  Tobacco Use   Smoking status: Never   Smokeless tobacco: Never  Vaping Use   Vaping status: Never Used  Substance and Sexual Activity   Alcohol use: No    Alcohol/week: 0.0 standard drinks of alcohol   Drug use: No   Sexual activity: Yes    Partners: Female    Birth control/protection: Surgical    Comment: INTERCOURSE AGE 89, SEXUAL PARTNERS LEES THAN 6

## 2023-02-04 NOTE — Telephone Encounter (Signed)
Per Franky Macho pt needs 2 week post op with August Saucer no avail appts

## 2023-02-08 DIAGNOSIS — M19012 Primary osteoarthritis, left shoulder: Secondary | ICD-10-CM

## 2023-02-08 DIAGNOSIS — M7522 Bicipital tendinitis, left shoulder: Secondary | ICD-10-CM

## 2023-02-08 DIAGNOSIS — M75122 Complete rotator cuff tear or rupture of left shoulder, not specified as traumatic: Secondary | ICD-10-CM

## 2023-02-08 NOTE — Telephone Encounter (Signed)
scheduled

## 2023-02-16 ENCOUNTER — Other Ambulatory Visit: Payer: Self-pay | Admitting: Orthopedic Surgery

## 2023-02-16 ENCOUNTER — Telehealth: Payer: Self-pay | Admitting: Orthopedic Surgery

## 2023-02-16 MED ORDER — OXYCODONE HCL 5 MG PO CAPS
5.0000 mg | ORAL_CAPSULE | Freq: Three times a day (TID) | ORAL | 0 refills | Status: DC | PRN
Start: 2023-02-16 — End: 2023-03-03

## 2023-02-16 NOTE — Telephone Encounter (Signed)
Pt requesting refill on Oxycodone please advise send to [facility] in [place]

## 2023-02-16 NOTE — Telephone Encounter (Signed)
STD forms received. To [employer].

## 2023-02-16 NOTE — Telephone Encounter (Signed)
Lvm advising  

## 2023-02-16 NOTE — Telephone Encounter (Signed)
Sent thx

## 2023-02-20 ENCOUNTER — Other Ambulatory Visit: Payer: Self-pay

## 2023-02-20 ENCOUNTER — Emergency Department
Admission: EM | Admit: 2023-02-20 | Discharge: 2023-02-20 | Disposition: A | Discharge status: Home / Self Care | Payer: 59 | Attending: Emergency Medicine | Admitting: Emergency Medicine

## 2023-02-20 ENCOUNTER — Emergency Department: Payer: 59

## 2023-02-20 DIAGNOSIS — R42 Dizziness and giddiness: Secondary | ICD-10-CM | POA: Insufficient documentation

## 2023-02-20 DIAGNOSIS — I10 Essential (primary) hypertension: Secondary | ICD-10-CM | POA: Insufficient documentation

## 2023-02-20 LAB — BASIC METABOLIC PANEL
Anion gap: 8 (ref 5–15)
BUN: 13 mg/dL (ref 6–20)
CO2: 20 mmol/L — ABNORMAL LOW (ref 22–32)
Calcium: 8.6 mg/dL — ABNORMAL LOW (ref 8.9–10.3)
Chloride: 106 mmol/L (ref 98–111)
Creatinine, Ser: 0.63 mg/dL (ref 0.44–1.00)
GFR, Estimated: >60 mL/min (ref >60)
Glucose, Bld: 86 mg/dL (ref 70–99)
Potassium: 4.2 mmol/L (ref 3.5–5.1)
Sodium: 134 mmol/L — ABNORMAL LOW (ref 135–145)

## 2023-02-20 LAB — CBC
HCT: 35.8 % — ABNORMAL LOW (ref 36.0–46.0)
Hemoglobin: 9.5 g/dL — ABNORMAL LOW (ref 12.0–15.0)
MCH: 22 pg — ABNORMAL LOW (ref 26.0–34.0)
MCHC: 26.5 g/dL — ABNORMAL LOW (ref 30.0–36.0)
MCV: 82.9 fL (ref 80.0–100.0)
Platelets: 277 10*3/uL (ref 150–400)
RBC: 4.32 MIL/uL (ref 3.87–5.11)
RDW: 18.3 % — ABNORMAL HIGH (ref 11.5–15.5)
WBC: 4.5 10*3/uL (ref 4.0–10.5)
nRBC: 0 % (ref 0.0–0.2)

## 2023-02-20 LAB — HEPATIC FUNCTION PANEL
ALT: 12 U/L (ref 0–44)
AST: 29 U/L (ref 15–41)
Albumin: 3.7 g/dL (ref 3.5–5.0)
Alkaline Phosphatase: 88 U/L (ref 38–126)
Bilirubin, Direct: 0.2 mg/dL (ref 0.0–0.2)
Indirect Bilirubin: 0.5 mg/dL (ref 0.3–0.9)
Total Bilirubin: 0.7 mg/dL (ref <1.2)
Total Protein: 7 g/dL (ref 6.5–8.1)

## 2023-02-20 MED ORDER — MECLIZINE HCL 25 MG PO TABS
25.0000 mg | ORAL_TABLET | Freq: Once | ORAL | Status: AC
  Administered 2023-02-20: 25 mg via ORAL
  Filled 2023-02-20: qty 1

## 2023-02-20 MED ORDER — MECLIZINE HCL 25 MG PO TABS: 25.0000 mg | ORAL_TABLET | Freq: Three times a day (TID) | ORAL | 0 refills | Status: DC | PRN

## 2023-02-20 MED ORDER — SODIUM CHLORIDE 0.9 % IV BOLUS
1000.0000 mL | Freq: Once | INTRAVENOUS | Status: AC
  Administered 2023-02-20: 1000 mL via INTRAVENOUS

## 2023-02-20 NOTE — ED Notes (Signed)
Pt verbalizes understanding of discharge instructions. Opportunity for questioning and answers were provided. Pt discharged from ED to home with family.    

## 2023-02-20 NOTE — ED Provider Notes (Signed)
[facility] Provider Note    Event Date/Time   First MD Initiated Contact with Patient 02/20/23 1314     (approximate)   History   Dizziness   HPI  [name] is a 57 y.o. female with history of anemia, celiac, GERD, hypertension and recent left shoulder surgery in October presents emergency department with complaints of dizziness.  Patient states it is not positional.  Woke up with symptoms this morning.  Denies any chest pain/shortness of breath.  States had not taking her iron pills as instructed due to the surgery.  No vomiting.  Does feel nauseated to the dizziness      Physical Exam   Triage Vital Signs: ED Triage Vitals  Encounter Vitals Group     BP 02/20/23 1233 (!) 137/101     Systolic BP Percentile --      Diastolic BP Percentile --      Pulse Rate 02/20/23 1233 83     Resp 02/20/23 1233 18     Temp 02/20/23 1233 98 F (36.7 C)     Temp src --      SpO2 02/20/23 1233 97 %     Weight --      Height --      Head Circumference --      Peak Flow --      Pain Score 02/20/23 1232 0     Pain Loc --      Pain Education --      Exclude from Growth Chart --     Most recent vital signs: Vitals:   02/20/23 1500 02/20/23 1515  BP: (!) 138/90   Pulse: 61 62  Resp: 14 15  Temp:    SpO2: 100% 98%     General: Awake, no distress.   CV:  Good peripheral perfusion. regular rate and  rhythm Resp:  Normal effort. Lungs cta Abd:  No distention.   Other:  PERRL, 3 beats nystagmus bilaterally, positional changes increased dizziness, grips equal bilaterally, neurovascular intact   ED Results / Procedures / Treatments   Labs (all labs ordered are listed, but only abnormal results are displayed) Labs Reviewed  BASIC METABOLIC PANEL - Abnormal; Notable for the following components:      Result Value   Sodium 134 (*)    CO2 20 (*)    Calcium 8.6 (*)    All other components within normal limits  CBC - Abnormal; Notable for the  following components:   Hemoglobin 9.5 (*)    HCT 35.8 (*)    MCH 22.0 (*)    MCHC 26.5 (*)    RDW 18.3 (*)    All other components within normal limits  HEPATIC FUNCTION PANEL  CBG MONITORING, ED     EKG  EKG   RADIOLOGY CT of the head    PROCEDURES:   Procedures   MEDICATIONS ORDERED IN ED: Medications  sodium chloride 0.9 % bolus 1,000 mL (0 mLs Intravenous Stopped 02/20/23 1554)  meclizine (ANTIVERT) tablet 25 mg (25 mg Oral Given 02/20/23 1338)     IMPRESSION / MDM / ASSESSMENT AND PLAN / ED COURSE  I reviewed the triage vital signs and the nursing notes.                              Differential diagnosis includes, but is not limited to, vertigo, anemia, dehydration, subdural, SAH, CVA  Patient's presentation is most  consistent with acute presentation with potential threat to life or bodily function.   EKG shows normal sinus rhythm, 89 bpm, no STEMI, see physician read  CT of the head  Labs are reassuring, hemoglobin is decreased but is improved from her presurgical labs  Antivert 25 mg p.o., normal saline 1 L IV  CT of the head independently reviewed interpreted by me by reading radiologist report, I do agree that this is negative for any acute abnormality.  Did explain these findings to the patient.  Due to the labs being normal, CT of the head is normal, EKG is normal, and the patient is feeling better, I did have her stand and walk to the bathroom.  She had no difficulty ambulating.  She did not get dizzy with standing.  Therefore I do not feel that she needs admission at this time.  I did discuss all findings with Dr. Anner Crete.  He is in agreement with discharging the patient.  She was given a prescription for Antivert.  Encouraged follow-up with her doctor due to the hemoglobin.  Discharged in stable condition.  Return if worsening      FINAL CLINICAL IMPRESSION(S) / ED DIAGNOSES   Final diagnoses:  Vertigo     Rx / DC Orders   ED Discharge  Orders          Ordered    meclizine (ANTIVERT) 25 MG tablet  3 times daily PRN        02/20/23 1539             Note:  This document was prepared using Dragon voice recognition software and may include unintentional dictation errors.    Faythe Ghee, PA-C 02/20/23 1753    Janith Lima, MD 02/20/23 [phone]

## 2023-02-20 NOTE — ED Triage Notes (Signed)
Pt comes with c/o dizziness and feeling like room is spinning. Pt states this all started today. Pt does states hx of anemia.

## 2023-02-21 ENCOUNTER — Encounter: Payer: Self-pay | Admitting: Orthopedic Surgery

## 2023-02-21 ENCOUNTER — Ambulatory Visit (INDEPENDENT_AMBULATORY_CARE_PROVIDER_SITE_OTHER): Payer: 59 | Admitting: Orthopedic Surgery

## 2023-02-21 DIAGNOSIS — M75122 Complete rotator cuff tear or rupture of left shoulder, not specified as traumatic: Secondary | ICD-10-CM

## 2023-02-21 MED ORDER — METHOCARBAMOL 500 MG PO TABS: 500.0000 mg | ORAL_TABLET | Freq: Three times a day (TID) | ORAL | 0 refills | Status: DC | PRN

## 2023-02-21 NOTE — Progress Notes (Signed)
Post-Op Visit Note   Patient: [name]           Date of Birth: 12-13-1965           MRN: [id number] Visit Date: 02/21/2023 PCP: Doreene Nest, NP   Assessment & Plan:  Chief Complaint:  Chief Complaint  Patient presents with   Left Shoulder - Routine Post Op   Visit Diagnoses:  1. Complete tear of left rotator cuff, unspecified whether traumatic     Plan: Patient is now about 3 weeks out left shoulder arthroscopy with distal clavicle excision and mini open rotator cuff tear and biceps tenodesis.  There was on the left-hand side.  Patient had an atypical shoulder rotator cuff tear near the muscle tendon junction.  She is taking oxycodone.  In the CPM machine.  On examination fairly stiff shoulder with range of motion of about 15/60/60.  Incisions intact.  And refill Robaxin.  Discontinue sling but no lifting with that left arm.  Range of motion sheets provided and she is going to continue with her brace CPM 3 hours a day.  Okay to start physical therapy here in 2 weeks for passive range of motion then she can start doing active range of motion and strengthening at 6 weeks postop.  Follow-up with [name] in 3 weeks.  Follow-Up Instructions: No follow-ups on file.   Orders:  Orders Placed This Encounter  Procedures   Ambulatory referral to Physical Therapy   Meds ordered this encounter  Medications   methocarbamol (ROBAXIN) 500 MG tablet    Sig: Take 1 tablet (500 mg total) by mouth every 8 (eight) hours as needed for muscle spasms.    Dispense:  30 tablet    Refill:  0    Imaging: CT HEAD WO CONTRAST ( )  Result Date: 02/20/2023 CLINICAL DATA:  Dizziness/vertigo EXAM: CT HEAD WITHOUT CONTRAST TECHNIQUE: Contiguous axial images were obtained from the base of the skull through the vertex without intravenous contrast. RADIATION DOSE REDUCTION: This exam was performed according to the departmental dose-optimization program which includes automated exposure control,  adjustment of the mA and/or kV according to patient size and/or use of iterative reconstruction technique. COMPARISON:  11/28/2022 FINDINGS: Brain: Mildly age advanced cerebral and cerebellar atrophy. No mass lesion, hemorrhage, hydrocephalus, acute infarct, intra-axial, or extra-axial fluid collection. Vascular: No hyperdense vessel or unexpected calcification. Skull: Hyperostosis frontalis interna. Sinuses/Orbits: Normal imaged portions of the orbits and globes. Clear paranasal sinuses and mastoid air cells. Other: None. IMPRESSION: Cerebral/cerebellar atrophy.  No acute findings. Electronically Signed   By: Jeronimo Greaves M.D.   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) 11/27/2021   Vitamin D deficiency 11/27/2021   Chronic fatigue 11/27/2021   Paranoid delusion (HCC) 11/27/2021   GERD (gastroesophageal reflux disease) 04/25/2019   Hyperlipidemia 03/02/2019   Essential hypertension 02/18/2019   Recurrent cystitis 11/27/2012   Migraine with visual aura 09/19/2011   Chronic constipation 06/25/2011   Past Medical History:  Diagnosis Date   Anemia    BV (bacterial vaginosis) 11/27/2012  Celiac disease    Cough due to ACE inhibitor 04/25/2019   Depression    Essential hypertension    Family history of adverse reaction to anesthesia    MOM-HARD TIME WAKING UP   GERD (gastroesophageal reflux disease)    Migraine with visual aura    MIGRAINES   UTI (lower urinary tract infection)     Family History  Problem Relation Age of Onset    Hypertension Mother    Stroke Mother    Cancer Father    Hypertension Maternal Grandmother    Colon cancer Maternal Grandmother    Breast cancer Maternal Grandmother    Skin cancer Maternal Grandmother    Hypertension Maternal Grandfather    Hypertension Paternal Grandmother    Diabetes Paternal Grandmother    Hypertension Paternal Grandfather    Diabetes Son 12       type 1    Past Surgical History:  Procedure Laterality Date   ABDOMINAL HYSTERECTOMY     BICEPT TENODESIS Left 01/27/2023   Procedure: BICEPS TENODESIS;  Surgeon: Cammy Copa, MD;  Location: [facility] OR;  Service: Orthopedics;  Laterality: Left;   BLADDER SUSPENSION     COLONOSCOPY WITH PROPOFOL N/A 05/11/2022   Procedure: COLONOSCOPY WITH PROPOFOL;  Surgeon: Wyline Mood, MD;  Location: [facility] ENDOSCOPY;  Service: Gastroenterology;  Laterality: N/A;   ESOPHAGOGASTRODUODENOSCOPY (EGD) WITH PROPOFOL N/A 05/11/2022   Procedure: ESOPHAGOGASTRODUODENOSCOPY (EGD) WITH PROPOFOL;  Surgeon: Wyline Mood, MD;  Location: [facility] ENDOSCOPY;  Service: Gastroenterology;  Laterality: N/A;   EXPLORATORY LAPAROTOMY     IUD REMOVAL  07/25/2017   Procedure: INTRAUTERINE DEVICE (IUD) REMOVAL;  Surgeon: Linzie Collin, MD;  Location: [facility] ORS;  Service: Gynecology;;   LAPAROSCOPIC ASSISTED VAGINAL HYSTERECTOMY Bilateral 07/25/2017   Procedure: LAPAROSCOPIC ASSISTED VAGINAL HYSTERECTOMY WITH BILATERAL SALPING OOPHERECTOMY;  Surgeon: Linzie Collin, MD;  Location: [facility] ORS;  Service: Gynecology;  Laterality: Bilateral;   SHOULDER ARTHROSCOPY WITH OPEN ROTATOR CUFF REPAIR AND DISTAL CLAVICLE ACROMINECTOMY Left 01/27/2023   Procedure: LEFT SHOULDER ARTHROSCOPY, DEBRIDEMENT, DISTAL CLAVICLE EXCISION, MINI OPEN ROTATOR CUFF TEAR REPAIR;  Surgeon: Cammy Copa, MD;  Location: [facility] OR;  Service: Orthopedics;  Laterality: Left;   TUBAL LIGATION     Social History   Occupational History   Occupation: Corporate treasurer  Tobacco  Use   Smoking status: Never   Smokeless tobacco: Never  Vaping Use   Vaping status: Never Used  Substance and Sexual Activity   Alcohol use: No    Alcohol/week: 0.0 standard drinks of alcohol   Drug use: No   Sexual activity: Yes    Partners: Female    Birth control/protection: Surgical    Comment: INTERCOURSE AGE 20, SEXUAL PARTNERS LEES THAN 6

## 2023-02-23 ENCOUNTER — Other Ambulatory Visit: Payer: Self-pay | Admitting: Surgical

## 2023-02-24 ENCOUNTER — Telehealth: Payer: Self-pay

## 2023-02-24 NOTE — Transitions of Care (Post Inpatient/ED Visit) (Signed)
pt said was seen 02/20/23 at [facility] ED even when laying still pt said room was spinning. pt has not been able to afford med so pt is still having episodes of dizziness. last episode was about 15 mins ago. episode only last few seconds.  Pt said dizziness not as frequent as when seen in ED. Pt said hgb was low also. Pt has not been able to afford the meclizine yet. Pt scheduled appt with Allayne Gitelman NP on 02/25/23 at 9:40 with UC & ED precautions and pt voiced understanding,.        02/24/2023  Name: [name] MRN: [id number] DOB: [birth year]  Today's TOC FU Call Status: Today's TOC FU Call Status:: Successful TOC FU Call Completed TOC FU Call Complete Date: 02/24/23 Patient's Name and Date of Birth confirmed.  Transition Care Management Follow-up Telephone Call Date of Discharge: 02/20/23 Discharge Facility[facility] Type of Discharge: Emergency Department Reason for ED Visit: Other: (pt said was seen 02/20/23 at [facility] ED even when laying still pt said room was spinning. pt has not been able to afford med so pt is still having episodes of dizziness. last episode was about 15 mins ago. episode only last few seconds.) How have you been since you were released from the hospital?: Better (still has some episodes of dizznerss) Any questions or concerns?: No  Items Reviewed: Did you receive and understand the discharge instructions provided?: Yes Medications obtained,verified, and reconciled?: Yes (Medications Reviewed) (pt has not been able to pick up meclizine due to expense.) Any new allergies since your discharge?: No Dietary orders reviewed?: NA Do you have support at home?: Yes People in Home: spouse Name of Support/Comfort Primary Source: [name]  Medications Reviewed Today: Medications Reviewed Today     Reviewed by Patience Musca, LPN (Licensed Practical Nurse) on 02/24/23 at 1357  Med List Status: <None>   Medication Order Taking? Sig Documenting  Provider Last Dose Status Informant  atorvastatin (LIPITOR) 20 MG tablet [id number] No Take 1 tablet (20 mg total) by mouth every evening. For cholesterol. Mort Sawyers, FNP More than a month Active Self  celecoxib (CELEBREX) 100 MG capsule [id number]  TAKE 1 CAPSULE BY MOUTH TWICE A DAY Magnant, Charles L, PA-C  Active   ferrous sulfate 325 (65 FE) MG EC tablet [id number] No Take 325 mg by mouth 3 (three) times daily with meals. [provider] 01/26/2023 Active Self           Med Note Gunnar Fusi, MELISSA R   Thu Jan 13, 2023 12:32 PM) Med ON HOLD   FLUoxetine (PROZAC) 20 MG capsule [id number] No Take 20 mg by mouth daily. Takes with 40 mg to = 60 mg daily [provider] 01/26/2023 Active Self  FLUoxetine (PROZAC) 40 MG capsule [id number] No Take 1 capsule (40 mg total) by mouth daily. Take total of 60 mg daily. Take along with 20 mg cap Neysa Hotter, MD 01/26/2023 Expired 02/15/23 2359 Self  losartan (COZAAR) 50 MG tablet [id number] No TAKE 1 TABLET (50 MG TOTAL) BY MOUTH DAILY. FOR BLOOD PRESSURE. Doreene Nest, NP 01/26/2023 Active Self  meclizine (ANTIVERT) 25 MG tablet [id number]  Take 1 tablet (25 mg total) by mouth 3 (three) times daily as needed for dizziness. Faythe Ghee, PA-C  Active   methocarbamol (ROBAXIN) 500 MG tablet [id number]  Take 1 tablet (500 mg total) by mouth every 8 (eight) hours as needed for muscle spasms. Magnant, Joycie Peek, PA-C  Active  methocarbamol (ROBAXIN) 500 MG tablet [id number]  Take 1 tablet (500 mg total) by mouth every 8 (eight) hours as needed for muscle spasms. Cammy Copa, MD  Active   oxycodone (OXY-IR) 5 MG capsule [id number]  Take 1 capsule (5 mg total) by mouth every 8 (eight) hours as needed. Cammy Copa, MD  Active   oxyCODONE (ROXICODONE) 5 MG immediate release tablet [id number]  Take 1 tablet (5 mg total) by mouth every 4 (four) hours as needed for severe pain (pain score 7-10). Magnant, Joycie Peek, PA-C  Active    pantoprazole (PROTONIX) 40 MG tablet [id number] No TAKE 1 TABLET (40 MG TOTAL) BY MOUTH DAILY. FOR HEARTBURN.  Patient taking differently: Take 40 mg by mouth 2 (two) times daily. For heartburn.   Doreene Nest, NP 01/26/2023 Active Self            Home Care and Equipment/Supplies: Were Home Health Services Ordered?: NA Any new equipment or medical supplies ordered?: NA  Functional Questionnaire: Do you need assistance with bathing/showering or dressing?: No Do you need assistance with meal preparation?: No Do you need assistance with eating?: No Do you have difficulty maintaining continence: No Do you need assistance with getting out of bed/getting out of a chair/moving?: No Do you have difficulty managing or taking your medications?: No  Follow up appointments reviewed: PCP Follow-up appointment confirmed?: Yes Date of PCP follow-up appointment?: 02/25/23 Follow-up Provider: Mayra Reel NP [facility] Follow-up appointment confirmed?: NA Do you need transportation to your follow-up appointment?: No Do you understand care options if your condition(s) worsen?: Yes-patient verbalized understanding    SIGNATURE Lewanda Rife, LPN

## 2023-02-24 NOTE — Telephone Encounter (Signed)
Noted, will evaluate. 

## 2023-02-25 ENCOUNTER — Encounter: Payer: Self-pay | Admitting: Primary Care

## 2023-02-25 ENCOUNTER — Ambulatory Visit (INDEPENDENT_AMBULATORY_CARE_PROVIDER_SITE_OTHER): Payer: 59 | Admitting: Primary Care

## 2023-02-25 VITALS — BP 132/80 | HR 80 | Temp 97.8°F | Ht 60.0 in | Wt 156.0 lb

## 2023-02-25 DIAGNOSIS — R42 Dizziness and giddiness: Secondary | ICD-10-CM | POA: Insufficient documentation

## 2023-02-25 DIAGNOSIS — K219 Gastro-esophageal reflux disease without esophagitis: Secondary | ICD-10-CM | POA: Diagnosis not present

## 2023-02-25 DIAGNOSIS — D509 Iron deficiency anemia, unspecified: Secondary | ICD-10-CM

## 2023-02-25 MED ORDER — PANTOPRAZOLE SODIUM 40 MG PO TBEC: 40.0000 mg | DELAYED_RELEASE_TABLET | Freq: Two times a day (BID) | ORAL | 1 refills | Status: DC

## 2023-02-25 NOTE — Assessment & Plan Note (Signed)
Recent emergency department visit. Emergency department labs, imaging, notes reviewed.  Discussed to start meclizine as prescribed. Information provided today for Epley maneuvers.  Follow-up with hematology as scheduled next week.

## 2023-02-25 NOTE — Patient Instructions (Signed)
Start the meclizine medication for dizziness.  Follow up with hematology next week for your low iron levels.   It was a pleasure to see you today!

## 2023-02-25 NOTE — Assessment & Plan Note (Signed)
Remains uncontrolled. Follow-up with hematology next week as scheduled.  Refills provided for pantoprazole.  Take 1 tablet mouth twice daily.

## 2023-02-25 NOTE — Progress Notes (Signed)
Subjective:    Patient ID: [name], female    DOB: May 24, 1965, 57 y.o.   MRN: [id number]  HPI  [name] is a very pleasant 57 y.o. female with a history of hypertension, migraines, chronic constipation, hyperlipidemia, recurrent cystitis, chronic fatigue, iron deficiency anemia, prediabetes who presents today for ED follow-up.  Evaluated at [facility] ED on 02/20/2023 for a several hour history of dizziness.  During her stay in the ED she underwent CT head which was normal.  She also underwent EKG which was normal.  CBC revealed continued anemia, improved from 1 month prior.  She was treated with IV fluids and meclizine for presumed vertigo and discharged home with recommendations for PCP follow-up.  Since her discharge home she continues to experience "room spinning" sensations, mostly when turning her head certain ways, when standing up, and laying down. She has yet to start meclizine but plans to start today.   She is scheduled to see her hematologist next week. She is not taking ferrous sulfate orally as she was told to discontinue. She denies vaginal bleeding, rectal bleeding. She has been instructed to take pantoprazole 40 mg BID, doesn't take consistently. Needs refills.   Slowly recovering from her recent left shoulder surgery. She will begin PT next week.   BP Readings from Last 3 Encounters:  02/25/23 132/80  02/20/23 (!) 138/90  01/27/23 127/74      Review of Systems  Gastrointestinal:  Negative for abdominal pain and blood in stool.  Genitourinary:  Negative for vaginal bleeding.  Neurological:  Positive for dizziness.         Past Medical History:  Diagnosis Date   Anemia    BV (bacterial vaginosis) 11/27/2012   Celiac disease    Cough due to ACE inhibitor 04/25/2019   Depression    Essential hypertension    Family history of adverse reaction to anesthesia    MOM-HARD TIME WAKING UP   GERD (gastroesophageal reflux disease)    Migraine with visual aura     MIGRAINES   UTI (lower urinary tract infection)     Social History   Socioeconomic History   Marital status: Married    Spouse name: [name]   Number of children: 3   Years of education: 12   Highest education level: High school graduate  Occupational History   Occupation: Corporate treasurer  Tobacco Use   Smoking status: Never   Smokeless tobacco: Never  Vaping Use   Vaping status: Never Used  Substance and Sexual Activity   Alcohol use: No    Alcohol/week: 0.0 standard drinks of alcohol   Drug use: No   Sexual activity: Yes    Partners: Female    Birth control/protection: Surgical    Comment: INTERCOURSE AGE 83, SEXUAL PARTNERS LEES THAN 6  Other Topics Concern   Not on file  Social History Narrative   Lives with her husband and their three children, and her daughter's boyfriend.  Her older son's daughter lives there part-time as well.   Social Determinants of Health   Financial Resource Strain: Not on file  Food Insecurity: Not on file  Transportation Needs: Not on file  Physical Activity: Not on file  Stress: Not on file  Social Connections: Not on file  Intimate Partner Violence: Not on file    Past Surgical History:  Procedure Laterality Date   ABDOMINAL HYSTERECTOMY     BICEPT TENODESIS Left 01/27/2023   Procedure: BICEPS TENODESIS;  Surgeon: Cammy Copa,  MD;  Location: [facility] OR;  Service: Orthopedics;  Laterality: Left;   BLADDER SUSPENSION     COLONOSCOPY WITH PROPOFOL N/A 05/11/2022   Procedure: COLONOSCOPY WITH PROPOFOL;  Surgeon: Wyline Mood, MD;  Location: [facility] ENDOSCOPY;  Service: Gastroenterology;  Laterality: N/A;   ESOPHAGOGASTRODUODENOSCOPY (EGD) WITH PROPOFOL N/A 05/11/2022   Procedure: ESOPHAGOGASTRODUODENOSCOPY (EGD) WITH PROPOFOL;  Surgeon: Wyline Mood, MD;  Location: [facility] ENDOSCOPY;  Service: Gastroenterology;  Laterality: N/A;   EXPLORATORY LAPAROTOMY     IUD REMOVAL  07/25/2017   Procedure: INTRAUTERINE DEVICE (IUD) REMOVAL;   Surgeon: Linzie Collin, MD;  Location: [facility] ORS;  Service: Gynecology;;   LAPAROSCOPIC ASSISTED VAGINAL HYSTERECTOMY Bilateral 07/25/2017   Procedure: LAPAROSCOPIC ASSISTED VAGINAL HYSTERECTOMY WITH BILATERAL SALPING OOPHERECTOMY;  Surgeon: Linzie Collin, MD;  Location: [facility] ORS;  Service: Gynecology;  Laterality: Bilateral;   SHOULDER ARTHROSCOPY WITH OPEN ROTATOR CUFF REPAIR AND DISTAL CLAVICLE ACROMINECTOMY Left 01/27/2023   Procedure: LEFT SHOULDER ARTHROSCOPY, DEBRIDEMENT, DISTAL CLAVICLE EXCISION, MINI OPEN ROTATOR CUFF TEAR REPAIR;  Surgeon: Cammy Copa, MD;  Location: [facility] OR;  Service: Orthopedics;  Laterality: Left;   TUBAL LIGATION      Family History  Problem Relation Age of Onset   Hypertension Mother    Stroke Mother    Cancer Father    Hypertension Maternal Grandmother    Colon cancer Maternal Grandmother    Breast cancer Maternal Grandmother    Skin cancer Maternal Grandmother    Hypertension Maternal Grandfather    Hypertension Paternal Grandmother    Diabetes Paternal Grandmother    Hypertension Paternal Grandfather    Diabetes Son 12       type 1    Allergies  Allergen Reactions   Topamax [Topiramate]     "cant function"    Tramadol     Cant function with it    Gluten Meal Diarrhea and Nausea Only        Ondansetron Other (See Comments)    Helps her nausea, but makes HA worse    Current Outpatient Medications on File Prior to Visit  Medication Sig Dispense Refill   celecoxib (CELEBREX) 100 MG capsule TAKE 1 CAPSULE BY MOUTH TWICE A DAY 60 capsule 0   FLUoxetine (PROZAC) 20 MG capsule Take 20 mg by mouth daily. Takes with 40 mg to = 60 mg daily     losartan (COZAAR) 50 MG tablet TAKE 1 TABLET (50 MG TOTAL) BY MOUTH DAILY. FOR BLOOD PRESSURE. 90 tablet 0   meclizine (ANTIVERT) 25 MG tablet Take 1 tablet (25 mg total) by mouth 3 (three) times daily as needed for dizziness. 30 tablet 0   methocarbamol (ROBAXIN) 500 MG tablet Take 1 tablet  (500 mg total) by mouth every 8 (eight) hours as needed for muscle spasms. 30 tablet 0   oxycodone (OXY-IR) 5 MG capsule Take 1 capsule (5 mg total) by mouth every 8 (eight) hours as needed. 30 capsule 0   atorvastatin (LIPITOR) 20 MG tablet Take 1 tablet (20 mg total) by mouth every evening. For cholesterol. (Patient not taking: Reported on 02/25/2023) 90 tablet 3   ferrous sulfate 325 (65 FE) MG EC tablet Take 325 mg by mouth 3 (three) times daily with meals. (Patient not taking: Reported on 02/25/2023)     FLUoxetine (PROZAC) 40 MG capsule Take 1 capsule (40 mg total) by mouth daily. Take total of 60 mg daily. Take along with 20 mg cap 90 capsule 0   methocarbamol (ROBAXIN) 500 MG tablet Take 1  tablet (500 mg total) by mouth every 8 (eight) hours as needed for muscle spasms. (Patient not taking: Reported on 02/25/2023) 30 tablet 1   oxyCODONE (ROXICODONE) 5 MG immediate release tablet Take 1 tablet (5 mg total) by mouth every 4 (four) hours as needed for severe pain (pain score 7-10). (Patient not taking: Reported on 02/25/2023) 30 tablet 0   No current facility-administered medications on file prior to visit.    BP 132/80   Pulse 80   Temp 97.8 F (36.6 C) (Temporal)   Ht 5' (1.524 m)   Wt 156 lb (70.8 kg)   LMP  (LMP Unknown)   SpO2 98%   BMI 30.47 kg/m  Objective:   Physical Exam Cardiovascular:     Rate and Rhythm: Normal rate and regular rhythm.  Pulmonary:     Effort: Pulmonary effort is normal.     Breath sounds: Normal breath sounds.  Musculoskeletal:     Cervical back: Neck supple.  Skin:    General: Skin is warm and dry.  Neurological:     Mental Status: She is alert and oriented to person, place, and time.  Psychiatric:        Mood and Affect: Mood normal.           Assessment & Plan:  Vertigo Assessment & Plan: Recent emergency department visit. Emergency department labs, imaging, notes reviewed.  Discussed to start meclizine as prescribed. Information  provided today for Epley maneuvers.  Follow-up with hematology as scheduled next week.   Gastroesophageal reflux disease, unspecified whether esophagitis present -     Pantoprazole Sodium; Take 1 tablet (40 mg total) by mouth 2 (two) times daily. For heartburn.  Dispense: 180 tablet; Refill: 1  Iron deficiency anemia, unspecified iron deficiency anemia type Assessment & Plan: Remains uncontrolled. Follow-up with hematology next week as scheduled.  Refills provided for pantoprazole.  Take 1 tablet mouth twice daily.         Doreene Nest, NP

## 2023-03-02 NOTE — Therapy (Signed)
OUTPATIENT PHYSICAL THERAPY SHOULDER EVALUATION   Patient Name: [name] MRN: [id number] DOB:09-24-1965, 57 y.o., female Today's Date: 03/07/2023  END OF SESSION:  PT End of Session - 03/07/23 1725     Visit Number 1    Number of Visits 17    Date for PT Re-Evaluation 05/02/23    Authorization Type [employer]    Progress Note Due on Visit 17    PT Start Time 0932    PT Stop Time 1016    PT Time Calculation (min) 44 min    Activity Tolerance Patient tolerated treatment well;No increased pain;Patient limited by pain    Behavior During Therapy [facility] for tasks assessed/performed             Past Medical History:  Diagnosis Date   Anemia    BV (bacterial vaginosis) 11/27/2012   Celiac disease    Cough due to ACE inhibitor 04/25/2019   Depression    Essential hypertension    Family history of adverse reaction to anesthesia    MOM-HARD TIME WAKING UP   GERD (gastroesophageal reflux disease)    Migraine with visual aura    MIGRAINES   UTI (lower urinary tract infection)    Past Surgical History:  Procedure Laterality Date   ABDOMINAL HYSTERECTOMY     BICEPT TENODESIS Left 01/27/2023   Procedure: BICEPS TENODESIS;  Surgeon: Cammy Copa, MD;  Location[facility] OR;  Service: Orthopedics;  Laterality: Left;   BLADDER SUSPENSION     COLONOSCOPY WITH PROPOFOL N/A 05/11/2022   Procedure: COLONOSCOPY WITH PROPOFOL;  Surgeon: Wyline Mood, MD;  Location: [facility] ENDOSCOPY;  Service: Gastroenterology;  Laterality: N/A;   ESOPHAGOGASTRODUODENOSCOPY (EGD) WITH PROPOFOL N/A 05/11/2022   Procedure: ESOPHAGOGASTRODUODENOSCOPY (EGD) WITH PROPOFOL;  Surgeon: Wyline Mood, MD;  Location: [facility] ENDOSCOPY;  Service: Gastroenterology;  Laterality: N/A;   EXPLORATORY LAPAROTOMY     IUD REMOVAL  07/25/2017   Procedure: INTRAUTERINE DEVICE (IUD) REMOVAL;  Surgeon: Linzie Collin, MD;  Location: [facility] ORS;  Service: Gynecology;;   LAPAROSCOPIC ASSISTED VAGINAL HYSTERECTOMY Bilateral 07/25/2017    Procedure: LAPAROSCOPIC ASSISTED VAGINAL HYSTERECTOMY WITH BILATERAL SALPING OOPHERECTOMY;  Surgeon: Linzie Collin, MD;  Location: [facility] ORS;  Service: Gynecology;  Laterality: Bilateral;   SHOULDER ARTHROSCOPY WITH OPEN ROTATOR CUFF REPAIR AND DISTAL CLAVICLE ACROMINECTOMY Left 01/27/2023   Procedure: LEFT SHOULDER ARTHROSCOPY, DEBRIDEMENT, DISTAL CLAVICLE EXCISION, MINI OPEN ROTATOR CUFF TEAR REPAIR;  Surgeon: Cammy Copa, MD;  Location: [facility] OR;  Service: Orthopedics;  Laterality: Left;   TUBAL LIGATION     Patient Active Problem List   Diagnosis Date Noted   Vertigo 02/25/2023   Complete tear of left rotator cuff 02/08/2023   Biceps tendonitis, left 02/08/2023   Arthritis of left acromioclavicular joint 02/08/2023   Memory change 12/06/2022   Excessive daytime sleepiness 12/06/2022   Prediabetes 07/29/2022   Persistent cough for 3 weeks or longer 01/21/2022   Iron deficiency anemia 11/30/2021   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) 11/27/2021   Vitamin D deficiency 11/27/2021   Chronic fatigue 11/27/2021   Paranoid delusion (HCC) 11/27/2021   GERD (gastroesophageal reflux disease) 04/25/2019   Hyperlipidemia 03/02/2019   Essential hypertension 02/18/2019   Recurrent cystitis 11/27/2012   Migraine with visual aura 09/19/2011   Chronic constipation 06/25/2011    PCP: Doreene Nest, NP  REFERRING PROVIDER: Cammy Copa, MD  REFERRING DIAG:  Diagnosis  [phone] (ICD-10-CM) - Complete tear of left rotator cuff, unspecified whether traumatic  THERAPY DIAG:  Abnormal posture - Plan: PT plan of care cert/re-cert  Muscle weakness (generalized) - Plan: PT plan of care cert/re-cert  Stiffness of left shoulder, not elsewhere classified - Plan: PT plan of care cert/re-cert  Chronic left shoulder pain - Plan: PT plan of care cert/re-cert  Rationale for Evaluation and Treatment: Rehabilitation  ONSET DATE:  01/27/2023 RTC repair  SUBJECTIVE:                                                                                                                                                                                      SUBJECTIVE STATEMENT: [name] had a rotator cuff repair 01/27/2023.  She reports more pain during the day than at night and a desire to return to work.  Hand dominance: Right  PERTINENT HISTORY: HTN, migraines, left biceps tenodesis, 01/27/2023 Lt open RTC repair, vertigo, pre-diabetes, HLD  PAIN:  Are you having pain? Yes: NPRS scale: 2-7/10 over the last week on a 10/10 Pain location: Left shoulder Pain description: Mostly achy, can be sharp, throbbing Aggravating factors: Lie on the left shoulder, reaching and overhead function Relieving factors: Oxycodone and muscle relaxers  PRECAUTIONS: Other: RTC repair  RED FLAGS: None   WEIGHT BEARING RESTRICTIONS:  Stay off the left shoulder  FALLS:  Has patient fallen in last 6 months? No  LIVING ENVIRONMENT: Lives with: lives with their family, lives with their spouse, and lives with an adult companion Lives in: House/apartment Stairs:  No issues Has following equipment at home: None  OCCUPATION: Works in a Personal assistant, works with machines, sits and stands  PLOF:  Was struggling before surgery  PATIENT GOALS: Be able to get back to work and normal function without pain or restriction  NEXT MD VISIT:   OBJECTIVE:  Note: Objective measures were completed at Evaluation unless otherwise noted.  DIAGNOSTIC FINDINGS: Pre-surgery: IMPRESSION: 1. Full-thickness tear of the supraspinatus anteriorly in the region of the myotendinous junction. 2. Mild to moderate rotator cuff tendinosis. 3. Focal tendinosis with probable interstitial tear of the long head biceps tendon in the groove entry zone. 4. Type 2 AC joint injury.  PATIENT SURVEYS:  FOTO 32 (Goal 60 in 17 visit)  COGNITION: Overall cognitive status:  Within functional limits for tasks assessed     SENSATION: No complaints of peripheral pain or paresthesias  POSTURE: Significant for mild forward head, internally rotated and protracted shoulders  UPPER EXTREMITY ROM:   Passive ROM Left/Right 03/07/2023   Shoulder flexion 95/165   Shoulder extension    Shoulder abduction    Shoulder adduction    Shoulder internal rotation 30/75   Shoulder external rotation 25/95  Elbow flexion    Elbow extension    Wrist flexion    Wrist extension    Wrist ulnar deviation    Wrist radial deviation    Wrist pronation    Wrist supination    (Blank rows = not tested)  UPPER EXTREMITY STRENGTH: Deferred secondary to being less than 6 weeks post rotator cuff repair at evaluation  In pounds with hand-held dynamometer Left/Right 03/07/2023   Shoulder flexion    Shoulder extension    Shoulder abduction    Shoulder adduction    Shoulder internal rotation    Shoulder external rotation    Middle trapezius    Lower trapezius    Elbow flexion    Elbow extension    Wrist flexion    Wrist extension    Wrist ulnar deviation    Wrist radial deviation    Wrist pronation    Wrist supination    Grip strength (lbs)    (Blank rows = not tested)    TODAY'S TREATMENT:                                                                                                                                         DATE: 03/07/2023  Codman's forward and back 20 times  Supine arm raises/scapular protraction 10 x 3 seconds  Supine shoulder IR/ER stretch 10 x 10 seconds in each direction (abduction to 70 degrees, elbow on several pillows so elbow is slightly higher than shoulder, avoid shoulder hiking during the exercise)  Shoulder blade pinches 10 x 5 seconds   Functional Activities:  Reviewed exam findings, reviewed post rotator cuff protocol including expectations at different timelines with emphasis on 90% active range of motion and 60% strength at 12 weeks  post-surgery.  PATIENT EDUCATION: Education details: See above Person educated: Patient Education method: Explanation, Demonstration, Tactile cues, Verbal cues, and Handouts Education comprehension: verbalized understanding, returned demonstration, verbal cues required, tactile cues required, and needs further education  HOME EXERCISE PROGRAM: Access Code: [email or web address] URL: [email or web address] Date: 03/07/2023 Prepared by: Pauletta Browns  Exercises - Pendulums  - 5 x daily - 7 x weekly - 1 sets - 30 reps - Standing Scapular Retraction  - 5 x daily - 7 x weekly - 1 sets - 5 reps - 5 second hold - Supine Scapular Protraction in Flexion with Dumbbells  - 2-3 x daily - 7 x weekly - 1 sets - 20 reps - 3 seconds hold - Supine Shoulder Internal Rotation Stretch  - 2-3 x daily - 7 x weekly - 1 sets - 10-20 reps - 10 seconds hold - Supine Shoulder External Rotation Stretch  - 2-3 x daily - 7 x weekly - 1 sets - 10-20 reps - 10 seconds hold  ASSESSMENT:  CLINICAL IMPRESSION: Patient is a 57 y.o. female who was seen today for physical therapy evaluation and treatment for  Diagnosis  M75.122 (  ICD-10-CM) - Complete tear of left rotator cuff, unspecified whether traumatic  .  Wilburta will be 6 weeks post surgery on Thanksgiving day.  She has capsular tightness, pain and functional impairments consistent with being less than 6 weeks status post left rotator cuff repair.  She was set up on a comprehensive rehabilitation program with early emphasis on scapular strength, shoulder active range of motion and capsular flexibility.  We will increase appropriate resistive strengthening by week 10 post-surgery with expectations of 90% active range of motion and 60% strength at her 12-week post-surgery anniversary.  OBJECTIVE IMPAIRMENTS: decreased activity tolerance, decreased endurance, decreased knowledge of condition, decreased ROM, decreased strength, decreased safety awareness, increased edema,  impaired perceived functional ability, impaired flexibility, impaired UE functional use, postural dysfunction, and pain.   ACTIVITY LIMITATIONS: carrying, lifting, and reach over head  PARTICIPATION LIMITATIONS: community activity and occupation  PERSONAL FACTORS: HTN, migraines, left biceps tenodesis, 01/27/2023 Lt open RTC repair, vertigo, pre-diabetes, HLD are also affecting patient's functional outcome.   REHAB POTENTIAL: Good  CLINICAL DECISION MAKING: Stable/uncomplicated  EVALUATION COMPLEXITY: Low   GOALS: Goals reviewed with patient? Yes  SHORT TERM GOALS: Target date: 04/04/2023  Desaree will be independent with her day 1 HEP  Baseline: Started 03/07/2023 Goal status: INITIAL  2.  Improve left shoulder passive range of motion for flexion to 150 degrees; ER to 70 degrees; IR to 50 degrees. Baseline: 95; 25 and 30 respectively Goal status: INITIAL   LONG TERM GOALS: Target date: 05/02/2023  Improve FOTO to 60 in 17 visits Baseline: 32 Goal status: INITIAL  2.  Improve left shoulder pain to consistently 0-3/10 on the Numeric Pain Rating Scale Baseline: 2-7/10 Goal status: INITIAL  3.  Improve left shoulder active range of motion to 90% of flexion 170 degrees; external rotation 90 degrees; internal rotation 70 degrees and horizontal adduction 40 degrees Baseline: See objective Goal status: INITIAL  4.  Improve left shoulder strength for IR and ER to 60% or better as compared to the uninvolved right Baseline: Deferred at evaluation secondary to being less than 6 weeks postsurgery Goal status: INITIAL  5.  Kelcey will be independent with her long-term HEP at DC Baseline: Started 03/07/2023 Goal status: INITIAL  PLAN:  PT FREQUENCY: 1-2x/week  PT DURATION: 8 weeks  PLANNED INTERVENTIONS: 97110-Therapeutic exercises, 97530- Therapeutic activity, 97112- Neuromuscular re-education, 97535- Self Care, 16109- Manual therapy, 97016- Vasopneumatic device, Patient/Family  education, and Cryotherapy  PLAN FOR NEXT SESSION: Capsular stretching, active range of motion and very light scapular strengthening appropriate for rotator cuff repair less than 6 weeks post-surgery   Cherlyn Cushing, PT, MPT 03/07/2023, 5:44 PM

## 2023-03-03 ENCOUNTER — Other Ambulatory Visit: Payer: 59

## 2023-03-03 ENCOUNTER — Telehealth: Payer: Self-pay | Admitting: Orthopedic Surgery

## 2023-03-03 ENCOUNTER — Ambulatory Visit: Payer: 59 | Admitting: Internal Medicine

## 2023-03-03 ENCOUNTER — Other Ambulatory Visit: Payer: Self-pay | Admitting: Surgical

## 2023-03-03 MED ORDER — OXYCODONE HCL 5 MG PO CAPS: 5.0000 mg | ORAL_CAPSULE | Freq: Two times a day (BID) | ORAL | 0 refills | Status: DC | PRN

## 2023-03-03 NOTE — Telephone Encounter (Signed)
Sent in refill

## 2023-03-03 NOTE — Telephone Encounter (Signed)
Pt requesting Oxycodone refill please advise

## 2023-03-04 ENCOUNTER — Inpatient Hospital Stay: Payer: 59 | Attending: Internal Medicine

## 2023-03-04 ENCOUNTER — Inpatient Hospital Stay (HOSPITAL_BASED_OUTPATIENT_CLINIC_OR_DEPARTMENT_OTHER): Payer: 59 | Admitting: Internal Medicine

## 2023-03-04 VITALS — BP 116/72 | HR 71 | Temp 97.8°F | Wt 158.0 lb

## 2023-03-04 DIAGNOSIS — D509 Iron deficiency anemia, unspecified: Secondary | ICD-10-CM | POA: Diagnosis present

## 2023-03-04 DIAGNOSIS — F32A Depression, unspecified: Secondary | ICD-10-CM | POA: Diagnosis not present

## 2023-03-04 DIAGNOSIS — K219 Gastro-esophageal reflux disease without esophagitis: Secondary | ICD-10-CM | POA: Diagnosis not present

## 2023-03-04 DIAGNOSIS — Z803 Family history of malignant neoplasm of breast: Secondary | ICD-10-CM | POA: Diagnosis not present

## 2023-03-04 DIAGNOSIS — I10 Essential (primary) hypertension: Secondary | ICD-10-CM | POA: Insufficient documentation

## 2023-03-04 DIAGNOSIS — R42 Dizziness and giddiness: Secondary | ICD-10-CM | POA: Insufficient documentation

## 2023-03-04 DIAGNOSIS — Z800 Family history of malignant neoplasm of digestive organs: Secondary | ICD-10-CM | POA: Diagnosis not present

## 2023-03-04 DIAGNOSIS — Z79899 Other long term (current) drug therapy: Secondary | ICD-10-CM | POA: Diagnosis not present

## 2023-03-04 LAB — VITAMIN B12: Vitamin B-12: 350 pg/mL (ref 180–914)

## 2023-03-04 LAB — CBC WITH DIFFERENTIAL/PLATELET
Abs Immature Granulocytes: 0.01 10*3/uL (ref 0.00–0.07)
Basophils Absolute: 0 10*3/uL (ref 0.0–0.1)
Basophils Relative: 1 %
Eosinophils Absolute: 0.5 10*3/uL (ref 0.0–0.5)
Eosinophils Relative: 12 %
HCT: 30.7 % — ABNORMAL LOW (ref 36.0–46.0)
Hemoglobin: 8.7 g/dL — ABNORMAL LOW (ref 12.0–15.0)
Immature Granulocytes: 0 %
Lymphocytes Relative: 24 %
Lymphs Abs: 1.1 10*3/uL (ref 0.7–4.0)
MCH: 22 pg — ABNORMAL LOW (ref 26.0–34.0)
MCHC: 28.3 g/dL — ABNORMAL LOW (ref 30.0–36.0)
MCV: 77.7 fL — ABNORMAL LOW (ref 80.0–100.0)
Monocytes Absolute: 0.3 10*3/uL (ref 0.1–1.0)
Monocytes Relative: 8 %
Neutro Abs: 2.4 10*3/uL (ref 1.7–7.7)
Neutrophils Relative %: 55 %
Platelets: 320 10*3/uL (ref 150–400)
RBC: 3.95 MIL/uL (ref 3.87–5.11)
RDW: 18.9 % — ABNORMAL HIGH (ref 11.5–15.5)
WBC: 4.4 10*3/uL (ref 4.0–10.5)
nRBC: 0 % (ref 0.0–0.2)

## 2023-03-04 LAB — IRON AND TIBC
Iron: 28 ug/dL (ref 28–170)
Saturation Ratios: 6 % — ABNORMAL LOW (ref 10.4–31.8)
TIBC: 447 ug/dL (ref 250–450)
UIBC: 419 ug/dL

## 2023-03-04 LAB — FERRITIN: Ferritin: 5 ng/mL — ABNORMAL LOW (ref 11–307)

## 2023-03-04 NOTE — Progress Notes (Signed)
Patient is wanting to know if she should start taking her iron pills again or start doing the iron infusions again. Patient had surgery last month on her shoulder.

## 2023-03-04 NOTE — Progress Notes (Signed)
[facility]  [phone] [phone]  ID: [name] OB: December 05, 1965  MR#: [id number]  KVQ#:[id number]  Patient Care Team: Doreene Nest, NP as PCP - General (Internal Medicine)  REFERRING PROVIDER: Vernona Rieger, NP  REASON FOR REFERRAL: IDA  HPI: [name] is a 57 y.o. female with past medical history of celiac disease, depression, hypertension, GERD, headaches was referred to hematology for management of iron deficiency anemia.  Patient reports that she was seen by PCP in August 2023 when she was having depression issues.  At that time iron panel was checked which was consistent with iron deficiency.  She was started on oral iron.  Labs were repeated in 3 weeks which showed normal iron panel and patient discontinued oral iron.  Since she had a history of iron deficiency, she had labs rechecked on 04/21/2022. Ferritin of 3.  Saturation 8%.  CBC showed hemoglobin 11.4.  WBC and platelets were normal.  CMP was largely unremarkable.  Vitamin B12 324.  TSH normal.  Patient denies any bleeding in stools or urine.  Denies any melanotic stools.  Uses NSAIDs occasionally.    Colonoscopy done by Dr. Tobi Bastos on 05/11/2022 showed multiple diverticulosis.  Repeat in 10 years was recommended.  Upper endoscopy showed large hiatal hernia, LA grade 3 esophagitis with no bleeding in the lower third of the esophagus.  Was advised omeprazole 40 mg twice daily for 3 months.  Repeat endoscopy in 2 months to assess response to treatment.  Completed IV Venofer 200 mg x 4 doses on 06/10/2022.  Tolerated well.  Reported history of celiac disease.  Celiac panel was checked but came back negative.  Duodenal biopsies also came back negative.  Interval history Patient was seen today as follow-up for iron deficiency anemia, labs. She denies any bleeding in urine or stools.  Her hemoglobin and iron levels have again dramatically gone down.  Reports dizziness.   REVIEW OF  SYSTEMS:   Review of Systems  Neurological:  Positive for dizziness.    As per HPI. Otherwise, a complete review of systems is negative.  PAST MEDICAL HISTORY: Past Medical History:  Diagnosis Date   Anemia    BV (bacterial vaginosis) 11/27/2012   Celiac disease    Cough due to ACE inhibitor 04/25/2019   Depression    Essential hypertension    Family history of adverse reaction to anesthesia    MOM-HARD TIME WAKING UP   GERD (gastroesophageal reflux disease)    Migraine with visual aura    MIGRAINES   UTI (lower urinary tract infection)     PAST SURGICAL HISTORY: Past Surgical History:  Procedure Laterality Date   ABDOMINAL HYSTERECTOMY     BICEPT TENODESIS Left 01/27/2023   Procedure: BICEPS TENODESIS;  Surgeon: Cammy Copa, MD;  Location: [facility] OR;  Service: Orthopedics;  Laterality: Left;   BLADDER SUSPENSION     COLONOSCOPY WITH PROPOFOL N/A 05/11/2022   Procedure: COLONOSCOPY WITH PROPOFOL;  Surgeon: Wyline Mood, MD;  Location: [facility] ENDOSCOPY;  Service: Gastroenterology;  Laterality: N/A;   ESOPHAGOGASTRODUODENOSCOPY (EGD) WITH PROPOFOL N/A 05/11/2022   Procedure: ESOPHAGOGASTRODUODENOSCOPY (EGD) WITH PROPOFOL;  Surgeon: Wyline Mood, MD;  Location: [facility] ENDOSCOPY;  Service: Gastroenterology;  Laterality: N/A;   EXPLORATORY LAPAROTOMY     IUD REMOVAL  07/25/2017   Procedure: INTRAUTERINE DEVICE (IUD) REMOVAL;  Surgeon: Linzie Collin, MD;  Location: [facility] ORS;  Service: Gynecology;;   LAPAROSCOPIC ASSISTED VAGINAL HYSTERECTOMY Bilateral 07/25/2017   Procedure: LAPAROSCOPIC ASSISTED VAGINAL HYSTERECTOMY WITH  BILATERAL SALPING OOPHERECTOMY;  Surgeon: Linzie Collin, MD;  Location: [facility] ORS;  Service: Gynecology;  Laterality: Bilateral;   SHOULDER ARTHROSCOPY WITH OPEN ROTATOR CUFF REPAIR AND DISTAL CLAVICLE ACROMINECTOMY Left 01/27/2023   Procedure: LEFT SHOULDER ARTHROSCOPY, DEBRIDEMENT, DISTAL CLAVICLE EXCISION, MINI OPEN ROTATOR CUFF TEAR REPAIR;  Surgeon: Cammy Copa, MD;  Location: [facility] OR;  Service: Orthopedics;  Laterality: Left;   TUBAL LIGATION      FAMILY HISTORY: Family History  Problem Relation Age of Onset   Hypertension Mother    Stroke Mother    Cancer Father    Hypertension Maternal Grandmother    Colon cancer Maternal Grandmother    Breast cancer Maternal Grandmother    Skin cancer Maternal Grandmother    Hypertension Maternal Grandfather    Hypertension Paternal Grandmother    Diabetes Paternal Grandmother    Hypertension Paternal Grandfather    Diabetes Son 5       type 1    HEALTH MAINTENANCE: Social History   Tobacco Use   Smoking status: Never   Smokeless tobacco: Never  Vaping Use   Vaping status: Never Used  Substance Use Topics   Alcohol use: No    Alcohol/week: 0.0 standard drinks of alcohol   Drug use: No     Allergies  Allergen Reactions   Topamax [Topiramate]     "cant function"    Tramadol     Cant function with it    Gluten Meal Diarrhea and Nausea Only        Ondansetron Other (See Comments)    Helps her nausea, but makes HA worse    Current Outpatient Medications  Medication Sig Dispense Refill   atorvastatin (LIPITOR) 20 MG tablet Take 1 tablet (20 mg total) by mouth every evening. For cholesterol. (Patient not taking: Reported on 02/25/2023) 90 tablet 3   celecoxib (CELEBREX) 100 MG capsule TAKE 1 CAPSULE BY MOUTH TWICE A DAY 60 capsule 0   ferrous sulfate 325 (65 FE) MG EC tablet Take 325 mg by mouth 3 (three) times daily with meals. (Patient not taking: Reported on 02/25/2023)     FLUoxetine (PROZAC) 20 MG capsule Take 20 mg by mouth daily. Takes with 40 mg to = 60 mg daily     FLUoxetine (PROZAC) 40 MG capsule Take 1 capsule (40 mg total) by mouth daily. Take total of 60 mg daily. Take along with 20 mg cap 90 capsule 0   losartan (COZAAR) 50 MG tablet TAKE 1 TABLET (50 MG TOTAL) BY MOUTH DAILY. FOR BLOOD PRESSURE. 90 tablet 0   meclizine (ANTIVERT) 25 MG tablet Take 1 tablet  (25 mg total) by mouth 3 (three) times daily as needed for dizziness. 30 tablet 0   methocarbamol (ROBAXIN) 500 MG tablet Take 1 tablet (500 mg total) by mouth every 8 (eight) hours as needed for muscle spasms. (Patient not taking: Reported on 02/25/2023) 30 tablet 1   methocarbamol (ROBAXIN) 500 MG tablet Take 1 tablet (500 mg total) by mouth every 8 (eight) hours as needed for muscle spasms. 30 tablet 0   oxycodone (OXY-IR) 5 MG capsule Take 1 capsule (5 mg total) by mouth every 12 (twelve) hours as needed. 30 capsule 0   pantoprazole (PROTONIX) 40 MG tablet Take 1 tablet (40 mg total) by mouth 2 (two) times daily. For heartburn. 180 tablet 1   No current facility-administered medications for this visit.    OBJECTIVE: Vitals:   03/04/23 0951  BP: 116/72  Pulse: 71  Temp: 97.8 F (36.6 C)  SpO2: 97%     Body mass index is 30.86 kg/m.      General: Well-developed, well-nourished, no acute distress. Eyes: Pink conjunctiva, anicteric sclera. HEENT: Normocephalic, moist mucous membranes, clear oropharnyx. Lungs: Clear to auscultation bilaterally. Heart: Regular rate and rhythm. No rubs, murmurs, or gallops. Abdomen: Soft, nontender, nondistended. No organomegaly noted, normoactive bowel sounds. Musculoskeletal: No edema, cyanosis, or clubbing. Neuro: Alert, answering all questions appropriately. Cranial nerves grossly intact. Skin: No rashes or petechiae noted. Psych: Normal affect. Lymphatics: No cervical, calvicular, axillary or inguinal LAD.   LAB RESULTS:  Lab Results  Component Value Date   NA 134 (L) 02/20/2023   K 4.2 02/20/2023   CL 106 02/20/2023   CO2 20 (L) 02/20/2023   GLUCOSE 86 02/20/2023   BUN 13 02/20/2023   CREATININE 0.63 02/20/2023   CALCIUM 8.6 (L) 02/20/2023   PROT 7.0 02/20/2023   ALBUMIN 3.7 02/20/2023   AST 29 02/20/2023   ALT 12 02/20/2023   ALKPHOS 88 02/20/2023   BILITOT 0.7 02/20/2023   GFRNONAA >60 02/20/2023   GFRAA >60 04/09/2019     Lab Results  Component Value Date   WBC 4.4 03/04/2023   NEUTROABS 2.4 03/04/2023   HGB 8.7 (L) 03/04/2023   HCT 30.7 (L) 03/04/2023   MCV 77.7 (L) 03/04/2023   PLT 320 03/04/2023    Lab Results  Component Value Date   TIBC 447 03/04/2023   TIBC 470.4 (H) 12/06/2022   TIBC 364 08/30/2022   FERRITIN 5 (L) 03/04/2023   FERRITIN 4.2 (L) 12/06/2022   FERRITIN 14 08/30/2022   IRONPCTSAT 6 (L) 03/04/2023   IRONPCTSAT 4.5 (L) 12/06/2022   IRONPCTSAT 15 08/30/2022     STUDIES: CT HEAD WO CONTRAST ( )  Result Date: 02/20/2023 CLINICAL DATA:  Dizziness/vertigo EXAM: CT HEAD WITHOUT CONTRAST TECHNIQUE: Contiguous axial images were obtained from the base of the skull through the vertex without intravenous contrast. RADIATION DOSE REDUCTION: This exam was performed according to the departmental dose-optimization program which includes automated exposure control, adjustment of the mA and/or kV according to patient size and/or use of iterative reconstruction technique. COMPARISON:  11/28/2022 FINDINGS: Brain: Mildly age advanced cerebral and cerebellar atrophy. No mass lesion, hemorrhage, hydrocephalus, acute infarct, intra-axial, or extra-axial fluid collection. Vascular: No hyperdense vessel or unexpected calcification. Skull: Hyperostosis frontalis interna. Sinuses/Orbits: Normal imaged portions of the orbits and globes. Clear paranasal sinuses and mastoid air cells. Other: None. IMPRESSION: Cerebral/cerebellar atrophy.  No acute findings. Electronically Signed   By: Jeronimo Greaves M.D.   On: 02/20/2023 14:28    ASSESSMENT AND PLAN:   [name] is a 57 y.o. female with pmh of celiac disease, depression, hypertension, GERD, headaches was referred to hematology for management of iron deficiency anemia.  # Iron deficiency anemia -Completed IV Venofer 200 mg x 4 doses in February 2024.  Hemoglobin normalized.  Iron panel pending.  -Colonoscopy done by Dr. Tobi Bastos on 05/11/2022 showed  multiple diverticulosis.  Repeat in 10 years was recommended.  Upper endoscopy showed large hiatal hernia, LA grade 3 esophagitis with no bleeding in the lower third of the esophagus.  Was advised omeprazole 40 mg twice daily for 3 months.  Repeat endoscopy in 2 months was advised to assess response to treatment.  Could not have repeat endoscopy due to family commitments.  -Celiac panel was negative.  -Labs reviewed.  Hemoglobin is down to 8.7.  Ferritin 5.  Will schedule her for IV Venofer 200  mg weekly x 5 doses.  I have sent a message to Dr. Tobi Bastos about consideration for capsule endoscopy.  I will follow-up with the patient in 3 months with labs.   Orders Placed This Encounter  Procedures   CBC with Differential/Platelet   Ferritin   Iron and TIBC([facility])   RTC in 3 months for MD visit, labs, Venofer  Patient expressed understanding and was in agreement with this plan. She also understands that She can call clinic at any time with any questions, concerns, or complaints.   I spent a total of 30 minutes reviewing chart data, face-to-face evaluation with the patient, counseling and coordination of care as detailed above.  Michaelyn Barter, MD   03/04/2023 12:25 PM

## 2023-03-04 NOTE — Patient Instructions (Signed)
Please start oral iron three times a week 325 mg with vitamin C on empty stomach.

## 2023-03-07 ENCOUNTER — Ambulatory Visit (INDEPENDENT_AMBULATORY_CARE_PROVIDER_SITE_OTHER): Payer: 59 | Admitting: Rehabilitative and Restorative Service Providers"

## 2023-03-07 ENCOUNTER — Encounter: Payer: Self-pay | Admitting: Rehabilitative and Restorative Service Providers"

## 2023-03-07 DIAGNOSIS — R293 Abnormal posture: Secondary | ICD-10-CM

## 2023-03-07 DIAGNOSIS — M6281 Muscle weakness (generalized): Secondary | ICD-10-CM

## 2023-03-07 DIAGNOSIS — M25612 Stiffness of left shoulder, not elsewhere classified: Secondary | ICD-10-CM

## 2023-03-07 DIAGNOSIS — M25512 Pain in left shoulder: Secondary | ICD-10-CM

## 2023-03-07 DIAGNOSIS — G8929 Other chronic pain: Secondary | ICD-10-CM

## 2023-03-08 ENCOUNTER — Other Ambulatory Visit: Payer: Self-pay

## 2023-03-08 ENCOUNTER — Telehealth: Payer: Self-pay

## 2023-03-08 DIAGNOSIS — D508 Other iron deficiency anemias: Secondary | ICD-10-CM

## 2023-03-08 NOTE — Telephone Encounter (Signed)
Called patient and patient is okay with scheduling the capsule study. Schedule for 03/22/2023 [name] over instructions, mailed them and sent to [facility]

## 2023-03-08 NOTE — Telephone Encounter (Signed)
-----   Message from Wyline Mood sent at 03/05/2023 10:49 AM EST ----- Regarding: RE: May benefit from capsule endoscopy [name]  Can you please schedule her a capsule study please   Kiran ----- Message ----- From: Michaelyn Barter, MD Sent: 03/04/2023  12:25 PM EST To: Wyline Mood, MD Subject: May benefit from capsule endoscopy             Hello Dr. Tobi Bastos,   I am seeing her for iron deficiency anemia.  She has previously had endoscopy and colonoscopy with no evident bleeding source.  Her iron levels and hemoglobin is down again.  I discussed with her about consideration for capsule endoscopy.  Thank you Candise Che

## 2023-03-09 ENCOUNTER — Encounter: Payer: 59 | Admitting: Physical Therapy

## 2023-03-09 ENCOUNTER — Telehealth: Payer: Self-pay | Admitting: Physical Therapy

## 2023-03-09 ENCOUNTER — Encounter: Payer: Self-pay | Admitting: Internal Medicine

## 2023-03-09 NOTE — Telephone Encounter (Signed)
I called pt after she didn't show for her 9:30 PT appointment. Pt stating she was unable to get a ride to therapy. I offered pt later appointment options and pt stated she was struggling financially and would just hold therapy until next week. Pt was reminded of her next appointment on 03/18/23. Pt stating she would check MyChart to keep up with her upcoming times.   Narda Amber, PT, MPT 03/09/23 9:49 AM

## 2023-03-14 ENCOUNTER — Ambulatory Visit (INDEPENDENT_AMBULATORY_CARE_PROVIDER_SITE_OTHER): Payer: 59 | Admitting: Orthopedic Surgery

## 2023-03-14 ENCOUNTER — Encounter: Payer: Self-pay | Admitting: Orthopedic Surgery

## 2023-03-14 DIAGNOSIS — M75122 Complete rotator cuff tear or rupture of left shoulder, not specified as traumatic: Secondary | ICD-10-CM

## 2023-03-14 NOTE — Progress Notes (Signed)
Post-Op Visit Note   Patient: [name]           Date of Birth: [birth year]           MRN: [id number] Visit Date: 03/14/2023 PCP: Doreene Nest, NP   Assessment & Plan:  Chief Complaint:  Chief Complaint  Patient presents with   Left Shoulder - Routine Post Op    01/27/23  left shoulder arthroscopy with distal clavicle excision and mini open rotator cuff tear and biceps tenodesis   Visit Diagnoses:  1. Complete tear of left rotator cuff, unspecified whether traumatic     Plan: Patient presents now 6 weeks out left shoulder arthroscopy with distal clavicle excision and mini open rotator cuff tear repair and biceps tenodesis.  Doing home exercise program along with physical therapy as well as exercises from a sheet we provided her.  She is sleeping okay.  Took 1 pain pill and 1 muscle relaxer yesterday.  In general does not take them all the time.  On examination she has range of motion of 30/85/115.  5 out of 5 external rotation strength.  Overall I think she is still improving.  Her range of motion looks better.  She still going to need to work about 2 to 3 hours a day on achieving more range of motion.  6-week return with Franky Macho for clinical recheck.  Follow-Up Instructions: No follow-ups on file.   Orders:  No orders of the defined types were placed in this encounter.  No orders of the defined types were placed in this encounter.   Imaging: No results found.  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) 11/27/2021    Vitamin D deficiency 11/27/2021   Chronic fatigue 11/27/2021   Paranoid delusion (HCC) 11/27/2021   GERD (gastroesophageal reflux disease) 04/25/2019   Hyperlipidemia 03/02/2019   Essential hypertension 02/18/2019   Recurrent cystitis 11/27/2012   Migraine with visual aura 09/19/2011   Chronic constipation 06/25/2011   Past Medical History:  Diagnosis Date   Anemia    BV (bacterial vaginosis) 11/27/2012   Celiac disease    Cough due to ACE inhibitor 04/25/2019   Depression    Essential hypertension    Family history of adverse reaction to anesthesia    MOM-HARD TIME WAKING UP   GERD (gastroesophageal reflux disease)    Migraine with visual aura    MIGRAINES   UTI (lower urinary tract infection)     Family History  Problem Relation Age of Onset   Hypertension Mother    Stroke Mother    Cancer Father    Hypertension Maternal Grandmother    Colon cancer Maternal Grandmother    Breast cancer Maternal Grandmother    Skin cancer Maternal Grandmother    Hypertension Maternal Grandfather    Hypertension Paternal Grandmother    Diabetes Paternal Grandmother    Hypertension Paternal Grandfather    Diabetes Son 12       type 1    Past Surgical History:  Procedure Laterality Date   ABDOMINAL HYSTERECTOMY  BICEPT TENODESIS Left 01/27/2023   Procedure: BICEPS TENODESIS;  Surgeon: Cammy Copa, MD;  Location: [facility] OR;  Service: Orthopedics;  Laterality: Left;   BLADDER SUSPENSION     COLONOSCOPY WITH PROPOFOL N/A 05/11/2022   Procedure: COLONOSCOPY WITH PROPOFOL;  Surgeon: Wyline Mood, MD;  Location: [facility] ENDOSCOPY;  Service: Gastroenterology;  Laterality: N/A;   ESOPHAGOGASTRODUODENOSCOPY (EGD) WITH PROPOFOL N/A 05/11/2022   Procedure: ESOPHAGOGASTRODUODENOSCOPY (EGD) WITH PROPOFOL;  Surgeon: Wyline Mood, MD;  Location: [facility] ENDOSCOPY;  Service: Gastroenterology;  Laterality: N/A;   EXPLORATORY LAPAROTOMY     IUD REMOVAL  07/25/2017   Procedure: INTRAUTERINE DEVICE (IUD)  REMOVAL;  Surgeon: Linzie Collin, MD;  Location: [facility] ORS;  Service: Gynecology;;   LAPAROSCOPIC ASSISTED VAGINAL HYSTERECTOMY Bilateral 07/25/2017   Procedure: LAPAROSCOPIC ASSISTED VAGINAL HYSTERECTOMY WITH BILATERAL SALPING OOPHERECTOMY;  Surgeon: Linzie Collin, MD;  Location: [facility] ORS;  Service: Gynecology;  Laterality: Bilateral;   SHOULDER ARTHROSCOPY WITH OPEN ROTATOR CUFF REPAIR AND DISTAL CLAVICLE ACROMINECTOMY Left 01/27/2023   Procedure: LEFT SHOULDER ARTHROSCOPY, DEBRIDEMENT, DISTAL CLAVICLE EXCISION, MINI OPEN ROTATOR CUFF TEAR REPAIR;  Surgeon: Cammy Copa, MD;  Location: [facility] OR;  Service: Orthopedics;  Laterality: Left;   TUBAL LIGATION     Social History   Occupational History   Occupation: Corporate treasurer  Tobacco Use   Smoking status: Never   Smokeless tobacco: Never  Vaping Use   Vaping status: Never Used  Substance and Sexual Activity   Alcohol use: No    Alcohol/week: 0.0 standard drinks of alcohol   Drug use: No   Sexual activity: Yes    Partners: Female    Birth control/protection: Surgical    Comment: INTERCOURSE AGE 77, SEXUAL PARTNERS LEES THAN 6

## 2023-03-17 ENCOUNTER — Inpatient Hospital Stay: Payer: 59 | Attending: Internal Medicine

## 2023-03-17 DIAGNOSIS — D509 Iron deficiency anemia, unspecified: Secondary | ICD-10-CM | POA: Insufficient documentation

## 2023-03-18 ENCOUNTER — Telehealth: Payer: Self-pay | Admitting: Orthopedic Surgery

## 2023-03-18 ENCOUNTER — Other Ambulatory Visit: Payer: Self-pay | Admitting: Surgical

## 2023-03-18 ENCOUNTER — Ambulatory Visit: Payer: 59 | Admitting: Physical Therapy

## 2023-03-18 ENCOUNTER — Encounter: Payer: Self-pay | Admitting: Physical Therapy

## 2023-03-18 DIAGNOSIS — M25612 Stiffness of left shoulder, not elsewhere classified: Secondary | ICD-10-CM

## 2023-03-18 DIAGNOSIS — M6281 Muscle weakness (generalized): Secondary | ICD-10-CM

## 2023-03-18 DIAGNOSIS — R293 Abnormal posture: Secondary | ICD-10-CM | POA: Diagnosis not present

## 2023-03-18 DIAGNOSIS — G8929 Other chronic pain: Secondary | ICD-10-CM

## 2023-03-18 DIAGNOSIS — M25512 Pain in left shoulder: Secondary | ICD-10-CM | POA: Diagnosis not present

## 2023-03-18 MED ORDER — METHOCARBAMOL 500 MG PO TABS: 500.0000 mg | ORAL_TABLET | Freq: Three times a day (TID) | ORAL | 0 refills | Status: DC | PRN

## 2023-03-18 MED ORDER — OXYCODONE HCL 5 MG PO CAPS: 5.0000 mg | ORAL_CAPSULE | Freq: Two times a day (BID) | ORAL | 0 refills | Status: DC | PRN

## 2023-03-18 NOTE — Telephone Encounter (Signed)
I called patient and advised. 

## 2023-03-18 NOTE — Telephone Encounter (Signed)
Pt requesting Oxycodone and Methocarbamol refill sent to [facility] in [facility]

## 2023-03-18 NOTE — Telephone Encounter (Signed)
Sent in earlier today.

## 2023-03-18 NOTE — Therapy (Signed)
OUTPATIENT PHYSICAL THERAPY SHOULDER TREATMENT   Patient Name: [name] MRN: [id number] DOB:[birth year], 57 y.o., female Today's Date: 03/18/2023  END OF SESSION:  PT End of Session - 03/18/23 0937     Visit Number 2    Number of Visits 17    Date for PT Re-Evaluation 05/02/23    Authorization Type [employer]    Progress Note Due on Visit 17    PT Start Time 0933    PT Stop Time 1004   moist heat for pain/spasm control not included in billing   PT Time Calculation (min) 31 min    Activity Tolerance Patient tolerated treatment well    Behavior During Therapy [facility] for tasks assessed/performed              Past Medical History:  Diagnosis Date   Anemia    BV (bacterial vaginosis) 11/27/2012   Celiac disease    Cough due to ACE inhibitor 04/25/2019   Depression    Essential hypertension    Family history of adverse reaction to anesthesia    MOM-HARD TIME WAKING UP   GERD (gastroesophageal reflux disease)    Migraine with visual aura    MIGRAINES   UTI (lower urinary tract infection)    Past Surgical History:  Procedure Laterality Date   ABDOMINAL HYSTERECTOMY     BICEPT TENODESIS Left 01/27/2023   Procedure: BICEPS TENODESIS;  Surgeon: Cammy Copa, MD;  Location: [facility] OR;  Service: Orthopedics;  Laterality: Left;   BLADDER SUSPENSION     COLONOSCOPY WITH PROPOFOL N/A 05/11/2022   Procedure: COLONOSCOPY WITH PROPOFOL;  Surgeon: Wyline Mood, MD;  Location: [facility] ENDOSCOPY;  Service: Gastroenterology;  Laterality: N/A;   ESOPHAGOGASTRODUODENOSCOPY (EGD) WITH PROPOFOL N/A 05/11/2022   Procedure: ESOPHAGOGASTRODUODENOSCOPY (EGD) WITH PROPOFOL;  Surgeon: Wyline Mood, MD;  Location: [facility] ENDOSCOPY;  Service: Gastroenterology;  Laterality: N/A;   EXPLORATORY LAPAROTOMY     IUD REMOVAL  07/25/2017   Procedure: INTRAUTERINE DEVICE (IUD) REMOVAL;  Surgeon: Linzie Collin, MD;  Location: [facility] ORS;  Service: Gynecology;;   LAPAROSCOPIC ASSISTED VAGINAL HYSTERECTOMY Bilateral  07/25/2017   Procedure: LAPAROSCOPIC ASSISTED VAGINAL HYSTERECTOMY WITH BILATERAL SALPING OOPHERECTOMY;  Surgeon: Linzie Collin, MD;  Location: [facility] ORS;  Service: Gynecology;  Laterality: Bilateral;   SHOULDER ARTHROSCOPY WITH OPEN ROTATOR CUFF REPAIR AND DISTAL CLAVICLE ACROMINECTOMY Left 01/27/2023   Procedure: LEFT SHOULDER ARTHROSCOPY, DEBRIDEMENT, DISTAL CLAVICLE EXCISION, MINI OPEN ROTATOR CUFF TEAR REPAIR;  Surgeon: Cammy Copa, MD;  Location: [facility] OR;  Service: Orthopedics;  Laterality: Left;   TUBAL LIGATION     Patient Active Problem List   Diagnosis Date Noted   Vertigo 02/25/2023   Complete tear of left rotator cuff 02/08/2023   Biceps tendonitis, left 02/08/2023   Arthritis of left acromioclavicular joint 02/08/2023   Memory change 12/06/2022   Excessive daytime sleepiness 12/06/2022   Prediabetes 07/29/2022   Persistent cough for 3 weeks or longer 01/21/2022   Iron deficiency anemia 11/30/2021   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) 11/27/2021   Vitamin D deficiency 11/27/2021   Chronic fatigue 11/27/2021   Paranoid delusion (HCC) 11/27/2021   GERD (gastroesophageal reflux disease) 04/25/2019   Hyperlipidemia 03/02/2019   Essential hypertension 02/18/2019   Recurrent cystitis 11/27/2012   Migraine with visual aura 09/19/2011   Chronic constipation 06/25/2011    PCP: Doreene Nest, NP  REFERRING PROVIDER: Cammy Copa, MD  REFERRING DIAG:  Diagnosis  [id number] (ICD-10-CM) - Complete tear of  left rotator cuff, unspecified whether traumatic    THERAPY DIAG:  Abnormal posture  Muscle weakness (generalized)  Stiffness of left shoulder, not elsewhere classified  Chronic left shoulder pain  Rationale for Evaluation and Treatment: Rehabilitation  ONSET DATE: 01/27/2023 RTC repair  SUBJECTIVE:                                                                                                                                                                                       SUBJECTIVE STATEMENT:  Went to the doctor and they said that it looks better, they want me to work on strength and ROM. Still hurting, need to see if the MD can give me something else for over the weekend. Feels worse in the cold. Still getting dizzy at times, still need to get some iron infusions.   Hand dominance: Right  PERTINENT HISTORY: HTN, migraines, left biceps tenodesis, 01/27/2023 Lt open RTC repair, vertigo, pre-diabetes, HLD  PAIN:  Are you having pain? Yes: NPRS scale: 3-4 now, 4-5/10/10 Pain location: Left shoulder Pain description: Mostly achy, can be sharp, throbbing Aggravating factors: Lie on the left shoulder, reaching and overhead function, using shoulder Relieving factors: Oxycodone and muscle relaxers, movements, holding shoulder   PRECAUTIONS: Other: RTC repair  RED FLAGS: None   WEIGHT BEARING RESTRICTIONS:  Stay off the left shoulder  FALLS:  Has patient fallen in last 6 months? No  LIVING ENVIRONMENT: Lives with: lives with their family, lives with their spouse, and lives with an adult companion Lives in: House/apartment Stairs:  No issues Has following equipment at home: None  OCCUPATION: Works in a Personal assistant, works with machines, sits and stands  PLOF:  Was struggling before surgery  PATIENT GOALS: Be able to get back to work and normal function without pain or restriction  NEXT MD VISIT:   OBJECTIVE:  Note: Objective measures were completed at Evaluation unless otherwise noted.  DIAGNOSTIC FINDINGS: Pre-surgery: IMPRESSION: 1. Full-thickness tear of the supraspinatus anteriorly in the region of the myotendinous junction. 2. Mild to moderate rotator cuff tendinosis. 3. Focal tendinosis with probable interstitial tear of the long head biceps tendon in the groove entry zone. 4. Type 2 AC joint injury.  PATIENT SURVEYS:  FOTO 32 (Goal 60 in 17  visit)  COGNITION: Overall cognitive status: Within functional limits for tasks assessed     SENSATION: No complaints of peripheral pain or paresthesias  POSTURE: Significant for mild forward head, internally rotated and protracted shoulders  UPPER EXTREMITY ROM:   Passive ROM Left/Right 03/07/2023   Shoulder flexion 95/165   Shoulder extension    Shoulder abduction    Shoulder adduction  Shoulder internal rotation 30/75   Shoulder external rotation 25/95   Elbow flexion    Elbow extension    Wrist flexion    Wrist extension    Wrist ulnar deviation    Wrist radial deviation    Wrist pronation    Wrist supination    (Blank rows = not tested)  UPPER EXTREMITY STRENGTH: Deferred secondary to being less than 6 weeks post rotator cuff repair at evaluation  In pounds with hand-held dynamometer Left/Right 03/07/2023   Shoulder flexion    Shoulder extension    Shoulder abduction    Shoulder adduction    Shoulder internal rotation    Shoulder external rotation    Middle trapezius    Lower trapezius    Elbow flexion    Elbow extension    Wrist flexion    Wrist extension    Wrist ulnar deviation    Wrist radial deviation    Wrist pronation    Wrist supination    Grip strength (lbs)    (Blank rows = not tested)    TODAY'S TREATMENT:                                                                                                                                         DATE:    03/18/23  TherEx  AAROM supine 1# dowel x14- had to stop due to shoulder burning/catching   MHP after flexion AAROM due to severe mm spasm/cramping anterior shoulder/chest X8 min (not included in billing)   L shoulder isometrics 10-25% effort: flexion 10x3 seconds, ABD 10x3 seconds  Manual   PROM/stretching all directions as appropriate/tolerated     03/07/2023  Codman's forward and back 20 times  Supine arm raises/scapular protraction 10 x 3 seconds  Supine shoulder IR/ER  stretch 10 x 10 seconds in each direction (abduction to 70 degrees, elbow on several pillows so elbow is slightly higher than shoulder, avoid shoulder hiking during the exercise)  Shoulder blade pinches 10 x 5 seconds   Functional Activities:  Reviewed exam findings, reviewed post rotator cuff protocol including expectations at different timelines with emphasis on 90% active range of motion and 60% strength at 12 weeks post-surgery.  PATIENT EDUCATION: Education details: See above Person educated: Patient Education method: Explanation, Demonstration, Tactile cues, Verbal cues, and Handouts Education comprehension: verbalized understanding, returned demonstration, verbal cues required, tactile cues required, and needs further education  HOME EXERCISE PROGRAM: Access Code: [email or web address] URL: [email or web address] Date: 03/07/2023 Prepared by: Pauletta Browns  Exercises - Pendulums  - 5 x daily - 7 x weekly - 1 sets - 30 reps - Standing Scapular Retraction  - 5 x daily - 7 x weekly - 1 sets - 5 reps - 5 second hold - Supine Scapular Protraction in Flexion with Dumbbells  - 2-3 x daily - 7 x weekly - 1 sets - 20 reps - 3 seconds hold -  Supine Shoulder Internal Rotation Stretch  - 2-3 x daily - 7 x weekly - 1 sets - 10-20 reps - 10 seconds hold - Supine Shoulder External Rotation Stretch  - 2-3 x daily - 7 x weekly - 1 sets - 10-20 reps - 10 seconds hold  ASSESSMENT:  CLINICAL IMPRESSION:  Pt arrives today doing OK, shoulder is a little more painful today due to cold weather. She is now at about 7 weeks post-op, progressed activities as appropriate given MD guidance for AAROM and strength at 6 weeks post-op (per initial order). Session today was limited due to severe mm cramping even with light activity such as supine AAROM.  ROM is definitely still lacking, improved throughout session but we will continue to work on this moving forward.     EVAL: Patient is a 57 y.o. female who  was seen today for physical therapy evaluation and treatment for  Diagnosis  M75.122 (ICD-10-CM) - Complete tear of left rotator cuff, unspecified whether traumatic  .  [name] will be 6 weeks post surgery on Thanksgiving day.  She has capsular tightness, pain and functional impairments consistent with being less than 6 weeks status post left rotator cuff repair.  She was set up on a comprehensive rehabilitation program with early emphasis on scapular strength, shoulder active range of motion and capsular flexibility.  We will increase appropriate resistive strengthening by week 10 post-surgery with expectations of 90% active range of motion and 60% strength at her 12-week post-surgery anniversary.  OBJECTIVE IMPAIRMENTS: decreased activity tolerance, decreased endurance, decreased knowledge of condition, decreased ROM, decreased strength, decreased safety awareness, increased edema, impaired perceived functional ability, impaired flexibility, impaired UE functional use, postural dysfunction, and pain.   ACTIVITY LIMITATIONS: carrying, lifting, and reach over head  PARTICIPATION LIMITATIONS: community activity and occupation  PERSONAL FACTORS: HTN, migraines, left biceps tenodesis, 01/27/2023 Lt open RTC repair, vertigo, pre-diabetes, HLD are also affecting patient's functional outcome.   REHAB POTENTIAL: Good  CLINICAL DECISION MAKING: Stable/uncomplicated  EVALUATION COMPLEXITY: Low   GOALS: Goals reviewed with patient? Yes  SHORT TERM GOALS: Target date: 04/04/2023  [name] will be independent with her day 1 HEP  Baseline: Started 03/07/2023 Goal status: INITIAL  2.  Improve left shoulder passive range of motion for flexion to 150 degrees; ER to 70 degrees; IR to 50 degrees. Baseline: 95; 25 and 30 respectively Goal status: INITIAL   LONG TERM GOALS: Target date: 05/02/2023  Improve FOTO to 60 in 17 visits Baseline: 32 Goal status: INITIAL  2.  Improve left shoulder pain to  consistently 0-3/10 on the Numeric Pain Rating Scale Baseline: 2-7/10 Goal status: INITIAL  3.  Improve left shoulder active range of motion to 90% of flexion 170 degrees; external rotation 90 degrees; internal rotation 70 degrees and horizontal adduction 40 degrees Baseline: See objective Goal status: INITIAL  4.  Improve left shoulder strength for IR and ER to 60% or better as compared to the uninvolved right Baseline: Deferred at evaluation secondary to being less than 6 weeks postsurgery Goal status: INITIAL  5.  [name] will be independent with her long-term HEP at DC Baseline: Started 03/07/2023 Goal status: INITIAL  PLAN:  PT FREQUENCY: 1-2x/week  PT DURATION: 8 weeks  PLANNED INTERVENTIONS: 97110-Therapeutic exercises, 97530- Therapeutic activity, 97112- Neuromuscular re-education, 97535- Self Care, 16109- Manual therapy, [id number]- Vasopneumatic device, Patient/Family education, and Cryotherapy  PLAN FOR NEXT SESSION: Capsular stretching, active range of motion and very light scapular strengthening appropriate for rotator cuff repair; now 7  weeks post-op as of 03/18/23  Nedra Hai, PT, DPT 03/18/23 10:12 AM

## 2023-03-22 ENCOUNTER — Encounter: Payer: 59 | Admitting: Physical Therapy

## 2023-03-22 ENCOUNTER — Ambulatory Visit: Admission: RE | Admit: 2023-03-22 | Payer: 59 | Source: Ambulatory Visit | Admitting: Gastroenterology

## 2023-03-22 SURGERY — GIVENS CAPSULE STUDY

## 2023-03-24 ENCOUNTER — Inpatient Hospital Stay: Payer: 59

## 2023-03-24 VITALS — BP 146/83 | HR 71 | Temp 98.6°F | Resp 18

## 2023-03-24 DIAGNOSIS — D509 Iron deficiency anemia, unspecified: Secondary | ICD-10-CM

## 2023-03-24 MED ORDER — SODIUM CHLORIDE 0.9% FLUSH
10.0000 mL | Freq: Once | INTRAVENOUS | Status: AC | PRN
  Administered 2023-03-24: 10 mL
  Filled 2023-03-24: qty 10

## 2023-03-24 MED ORDER — IRON SUCROSE 20 MG/ML IV SOLN
200.0000 mg | Freq: Once | INTRAVENOUS | Status: AC
  Administered 2023-03-24: 200 mg via INTRAVENOUS

## 2023-03-25 ENCOUNTER — Ambulatory Visit (INDEPENDENT_AMBULATORY_CARE_PROVIDER_SITE_OTHER): Payer: 59 | Admitting: Rehabilitative and Restorative Service Providers"

## 2023-03-25 ENCOUNTER — Encounter: Payer: Self-pay | Admitting: Rehabilitative and Restorative Service Providers"

## 2023-03-25 DIAGNOSIS — M25612 Stiffness of left shoulder, not elsewhere classified: Secondary | ICD-10-CM | POA: Diagnosis not present

## 2023-03-25 DIAGNOSIS — G8929 Other chronic pain: Secondary | ICD-10-CM

## 2023-03-25 DIAGNOSIS — R293 Abnormal posture: Secondary | ICD-10-CM

## 2023-03-25 DIAGNOSIS — M25512 Pain in left shoulder: Secondary | ICD-10-CM

## 2023-03-25 DIAGNOSIS — M6281 Muscle weakness (generalized): Secondary | ICD-10-CM | POA: Diagnosis not present

## 2023-03-25 NOTE — Therapy (Signed)
OUTPATIENT PHYSICAL THERAPY SHOULDER TREATMENT   Patient Name: Barbarella Wiker MRN: [id number] DOB:January 31, 1966, 57 y.o., female Today's Date: 03/25/2023  END OF SESSION:  PT End of Session - 03/25/23 1057     Visit Number 3    Number of Visits 17    Date for PT Re-Evaluation 05/02/23    Authorization Type [employer]    Progress Note Due on Visit 17    PT Start Time 1015    PT Stop Time 1056    PT Time Calculation (min) 41 min    Activity Tolerance Patient tolerated treatment well;No increased pain    Behavior During Therapy [facility] for tasks assessed/performed               Past Medical History:  Diagnosis Date   Anemia    BV (bacterial vaginosis) 11/27/2012   Celiac disease    Cough due to ACE inhibitor 04/25/2019   Depression    Essential hypertension    Family history of adverse reaction to anesthesia    MOM-HARD TIME WAKING UP   GERD (gastroesophageal reflux disease)    Migraine with visual aura    MIGRAINES   UTI (lower urinary tract infection)    Past Surgical History:  Procedure Laterality Date   ABDOMINAL HYSTERECTOMY     BICEPT TENODESIS Left 01/27/2023   Procedure: BICEPS TENODESIS;  Surgeon: Cammy Copa, MD;  Location: [facility] OR;  Service: Orthopedics;  Laterality: Left;   BLADDER SUSPENSION     COLONOSCOPY WITH PROPOFOL N/A 05/11/2022   Procedure: COLONOSCOPY WITH PROPOFOL;  Surgeon: Wyline Mood, MD;  Location: [facility] ENDOSCOPY;  Service: Gastroenterology;  Laterality: N/A;   ESOPHAGOGASTRODUODENOSCOPY (EGD) WITH PROPOFOL N/A 05/11/2022   Procedure: ESOPHAGOGASTRODUODENOSCOPY (EGD) WITH PROPOFOL;  Surgeon: Wyline Mood, MD;  Location: [facility] ENDOSCOPY;  Service: Gastroenterology;  Laterality: N/A;   EXPLORATORY LAPAROTOMY     IUD REMOVAL  07/25/2017   Procedure: INTRAUTERINE DEVICE (IUD) REMOVAL;  Surgeon: Linzie Collin, MD;  Location: [facility] ORS;  Service: Gynecology;;   LAPAROSCOPIC ASSISTED VAGINAL HYSTERECTOMY Bilateral 07/25/2017   Procedure: LAPAROSCOPIC  ASSISTED VAGINAL HYSTERECTOMY WITH BILATERAL SALPING OOPHERECTOMY;  Surgeon: Linzie Collin, MD;  Location: [facility] ORS;  Service: Gynecology;  Laterality: Bilateral;   SHOULDER ARTHROSCOPY WITH OPEN ROTATOR CUFF REPAIR AND DISTAL CLAVICLE ACROMINECTOMY Left 01/27/2023   Procedure: LEFT SHOULDER ARTHROSCOPY, DEBRIDEMENT, DISTAL CLAVICLE EXCISION, MINI OPEN ROTATOR CUFF TEAR REPAIR;  Surgeon: Cammy Copa, MD;  Location: [facility] OR;  Service: Orthopedics;  Laterality: Left;   TUBAL LIGATION     Patient Active Problem List   Diagnosis Date Noted   Vertigo 02/25/2023   Complete tear of left rotator cuff 02/08/2023   Biceps tendonitis, left 02/08/2023   Arthritis of left acromioclavicular joint 02/08/2023   Memory change 12/06/2022   Excessive daytime sleepiness 12/06/2022   Prediabetes 07/29/2022   Persistent cough for 3 weeks or longer 01/21/2022   Iron deficiency anemia 11/30/2021   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) 11/27/2021   Vitamin D deficiency 11/27/2021   Chronic fatigue 11/27/2021   Paranoid delusion (HCC) 11/27/2021   GERD (gastroesophageal reflux disease) 04/25/2019   Hyperlipidemia 03/02/2019   Essential hypertension 02/18/2019   Recurrent cystitis 11/27/2012   Migraine with visual aura 09/19/2011   Chronic constipation 06/25/2011    PCP: Doreene Nest, NP  REFERRING PROVIDER: Cammy Copa, MD  REFERRING DIAG:  Diagnosis  [phone] (ICD-10-CM) - Complete tear of left rotator cuff, unspecified whether traumatic  THERAPY DIAG:  Abnormal posture  Muscle weakness (generalized)  Stiffness of left shoulder, not elsewhere classified  Chronic left shoulder pain  Rationale for Evaluation and Treatment: Rehabilitation  ONSET DATE: 01/27/2023 RTC repair  SUBJECTIVE:                                                                                                                                                                                       SUBJECTIVE STATEMENT: Taralyn has been off pain meds since last Friday.  Sleep is now normal.  An occasional ibuprofen manages pain.  Hand dominance: Right  PERTINENT HISTORY: HTN, migraines, left biceps tenodesis, 01/27/2023 Lt open RTC repair, vertigo, pre-diabetes, HLD  PAIN:  Are you having pain? Yes: NPRS scale: 1-7/10 on a 10/10 this week on a 10/10 Pain location: Left shoulder Pain description: Mostly achy, can be sharp, throbbing Aggravating factors: Moving the shoulder too fast Relieving factors: Heat, movements and exercises   PRECAUTIONS: Other: RTC repair  RED FLAGS: None   WEIGHT BEARING RESTRICTIONS:  Stay off the left shoulder  FALLS:  Has patient fallen in last 6 months? No  LIVING ENVIRONMENT: Lives with: lives with their family, lives with their spouse, and lives with an adult companion Lives in: House/apartment Stairs:  No issues Has following equipment at home: None  OCCUPATION: Works in a Personal assistant, works with machines, sits and stands  PLOF:  Was struggling before surgery  PATIENT GOALS: Be able to get back to work and normal function without pain or restriction  NEXT MD VISIT:   OBJECTIVE:  Note: Objective measures were completed at Evaluation unless otherwise noted.  DIAGNOSTIC FINDINGS: Pre-surgery: IMPRESSION: 1. Full-thickness tear of the supraspinatus anteriorly in the region of the myotendinous junction. 2. Mild to moderate rotator cuff tendinosis. 3. Focal tendinosis with probable interstitial tear of the long head biceps tendon in the groove entry zone. 4. Type 2 AC joint injury.  PATIENT SURVEYS:  FOTO 32 (Goal 60 in 17 visit)  COGNITION: Overall cognitive status: Within functional limits for tasks assessed     SENSATION: No complaints of peripheral pain or paresthesias  POSTURE: Significant for mild forward head, internally rotated and protracted shoulders  UPPER EXTREMITY ROM:    Passive ROM Left/Right 03/07/2023 Left 03/25/2023  Shoulder flexion 95/165 130  Shoulder extension    Shoulder abduction    Shoulder adduction    Shoulder internal rotation 30/75 50  Shoulder external rotation 25/95 55  Elbow flexion    Elbow extension    Wrist flexion    Wrist extension    Wrist ulnar deviation    Wrist radial deviation    Wrist pronation  Wrist supination    (Blank rows = not tested)  UPPER EXTREMITY STRENGTH: Deferred secondary to being less than 6 weeks post rotator cuff repair at evaluation  In pounds with hand-held dynamometer Left/Right 03/07/2023   Shoulder flexion    Shoulder extension    Shoulder abduction    Shoulder adduction    Shoulder internal rotation    Shoulder external rotation    Middle trapezius    Lower trapezius    Elbow flexion    Elbow extension    Wrist flexion    Wrist extension    Wrist ulnar deviation    Wrist radial deviation    Wrist pronation    Wrist supination    Grip strength (lbs)    (Blank rows = not tested)    TODAY'S TREATMENT:                                                                                                                                         DATE:  03/25/2023 Supine arm raises/scapular protraction 20 x 3 seconds with 2#  Supine shoulder IR/ER stretch 20 x 10 seconds in each direction (abduction to 70 degrees, elbow on several pillows so elbow is slightly higher than shoulder, avoid shoulder hiking during the exercise) Supine shoulder flexion (palm faces in, protract 1st, elbow in by ear) 10 x 5 seconds Side-lie shoulder ER 10 x 0#  Shoulder blade pinches 10 x 5 seconds  Side-lie ER 10 x 3 seconds   03/18/23 TherEx AAROM supine 1# dowel x14- had to stop due to shoulder burning/catching   MHP after flexion AAROM due to severe mm spasm/cramping anterior shoulder/chest X8 min (not included in billing)   L shoulder isometrics 10-25% effort: flexion 10x3 seconds, ABD 10x3  seconds  Manual  PROM/stretching all directions as appropriate/tolerated   03/07/2023  Codman's forward and back 20 times  Supine arm raises/scapular protraction 10 x 3 seconds  Supine shoulder IR/ER stretch 10 x 10 seconds in each direction (abduction to 70 degrees, elbow on several pillows so elbow is slightly higher than shoulder, avoid shoulder hiking during the exercise)  Shoulder blade pinches 10 x 5 seconds   Functional Activities:  Reviewed exam findings, reviewed post rotator cuff protocol including expectations at different timelines with emphasis on 90% active range of motion and 60% strength at 12 weeks post-surgery.  PATIENT EDUCATION: Education details: See above Person educated: Patient Education method: Explanation, Demonstration, Tactile cues, Verbal cues, and Handouts Education comprehension: verbalized understanding, returned demonstration, verbal cues required, tactile cues required, and needs further education  HOME EXERCISE PROGRAM: Access Code: [email or web address] URL: [email or web address] Date: 03/25/2023 Prepared by: Pauletta Browns  Exercises - Standing Scapular Retraction  - 5 x daily - 7 x weekly - 1 sets - 5 reps - 5 second hold - Supine Scapular Protraction in Flexion with Dumbbells  - 2-3 x daily - 7 x weekly -  1 sets - 20 reps - 3 seconds hold - Supine Shoulder Internal Rotation Stretch  - 2-3 x daily - 7 x weekly - 1 sets - 10-20 reps - 10 seconds hold - Supine Shoulder External Rotation Stretch  - 2-3 x daily - 7 x weekly - 1 sets - 20 reps - 10 seconds hold - Supine Shoulder Flexion Extension Full Range AROM  - 2-3 x daily - 7 x weekly - 1 sets - 10 reps - 5 seconds hold - Sidelying Shoulder External Rotation Dumbbell  - 2 x daily - 7 x weekly - 10 reps   ASSESSMENT:  CLINICAL IMPRESSION:  Shaqueria is making good progress with her post rotator cuff repair physical therapy.  AROM is progressing and her sleep quality is improving.  Artesha noted only  1 high pain episode over the past week when she jerked her arm trying to keep her granddaughter from falling.  She did not actually have to stop her granddaughter from falling, it was just the quick jerking movement that caused the brief 7/10 pain.  Active range of motion and functional strengthening was a focus today and we will continue to appropriately progress scapular and functional strength for the next 2 weeks before beginning more isolated rotator cuff strengthening.  12-week goals include 90% or better active range of motion, 60% or better strength as compared to the uninvolved side.   EVAL: Patient is a 57 y.o. female who was seen today for physical therapy evaluation and treatment for  Diagnosis  M75.122 (ICD-10-CM) - Complete tear of left rotator cuff, unspecified whether traumatic  .  Gianella will be 6 weeks post surgery on Thanksgiving day.  She has capsular tightness, pain and functional impairments consistent with being less than 6 weeks status post left rotator cuff repair.  She was set up on a comprehensive rehabilitation program with early emphasis on scapular strength, shoulder active range of motion and capsular flexibility.  We will increase appropriate resistive strengthening by week 10 post-surgery with expectations of 90% active range of motion and 60% strength at her 12-week post-surgery anniversary.  OBJECTIVE IMPAIRMENTS: decreased activity tolerance, decreased endurance, decreased knowledge of condition, decreased ROM, decreased strength, decreased safety awareness, increased edema, impaired perceived functional ability, impaired flexibility, impaired UE functional use, postural dysfunction, and pain.   ACTIVITY LIMITATIONS: carrying, lifting, and reach over head  PARTICIPATION LIMITATIONS: community activity and occupation  PERSONAL FACTORS: HTN, migraines, left biceps tenodesis, 01/27/2023 Lt open RTC repair, vertigo, pre-diabetes, HLD are also affecting patient's functional  outcome.   REHAB POTENTIAL: Good  CLINICAL DECISION MAKING: Stable/uncomplicated  EVALUATION COMPLEXITY: Low   GOALS: Goals reviewed with patient? Yes  SHORT TERM GOALS: Target date: 04/04/2023  Julanne will be independent with her day 1 HEP  Baseline: Started 03/07/2023 Goal status: Met 03/25/2023  2.  Improve left shoulder passive range of motion for flexion to 150 degrees; ER to 70 degrees; IR to 50 degrees. Baseline: 95; 25 and 30 respectively Goal status: Partially met 03/25/2023   LONG TERM GOALS: Target date: 05/02/2023  Improve FOTO to 60 in 17 visits Baseline: 32 Goal status: INITIAL  2.  Improve left shoulder pain to consistently 0-3/10 on the Numeric Pain Rating Scale Baseline: 2-7/10 Goal status: On Going 03/25/2023  3.  Improve left shoulder active range of motion to 90% of flexion 170 degrees; external rotation 90 degrees; internal rotation 70 degrees and horizontal adduction 40 degrees Baseline: See objective Goal status: INITIAL  4.  Improve  left shoulder strength for IR and ER to 60% or better as compared to the uninvolved right Baseline: Deferred at evaluation secondary to being less than 6 weeks postsurgery Goal status: INITIAL  5.  Lelia will be independent with her long-term HEP at DC Baseline: Started 03/07/2023 Goal status: INITIAL  PLAN:  PT FREQUENCY: 1-2x/week  PT DURATION: 8 weeks  PLANNED INTERVENTIONS: 97110-Therapeutic exercises, 97530- Therapeutic activity, 97112- Neuromuscular re-education, 97535- Self Care, [id number]- Manual therapy, [id number]- Vasopneumatic device, Patient/Family education, and Cryotherapy  PLAN FOR NEXT SESSION: Active range of motion and scapular strengthening appropriate for rotator cuff repair; now 8 weeks post-op as of 03/25/23.  Cherlyn Cushing PT, MPT 03/25/23 2:08 PM

## 2023-03-30 ENCOUNTER — Encounter: Payer: Self-pay | Admitting: Physical Therapy

## 2023-03-30 ENCOUNTER — Ambulatory Visit: Payer: 59 | Admitting: Physical Therapy

## 2023-03-30 ENCOUNTER — Encounter: Payer: 59 | Admitting: Rehabilitative and Restorative Service Providers"

## 2023-03-30 DIAGNOSIS — G8929 Other chronic pain: Secondary | ICD-10-CM

## 2023-03-30 DIAGNOSIS — M25512 Pain in left shoulder: Secondary | ICD-10-CM

## 2023-03-30 DIAGNOSIS — M25612 Stiffness of left shoulder, not elsewhere classified: Secondary | ICD-10-CM | POA: Diagnosis not present

## 2023-03-30 DIAGNOSIS — M6281 Muscle weakness (generalized): Secondary | ICD-10-CM

## 2023-03-30 DIAGNOSIS — R293 Abnormal posture: Secondary | ICD-10-CM | POA: Diagnosis not present

## 2023-03-30 NOTE — Therapy (Signed)
OUTPATIENT PHYSICAL THERAPY SHOULDER TREATMENT   Patient Name: [name] MRN: [id number] DOB:[birth year], 57 y.o., female Today's Date: 03/30/2023  END OF SESSION:  PT End of Session - 03/30/23 0934     Visit Number 4    Number of Visits 17    Date for PT Re-Evaluation 05/02/23    Authorization Type [employer]    Progress Note Due on Visit 17    PT Start Time 0935    PT Stop Time 1013    PT Time Calculation (min) 38 min    Activity Tolerance Patient tolerated treatment well;No increased pain    Behavior During Therapy [facility] for tasks assessed/performed               Past Medical History:  Diagnosis Date   Anemia    BV (bacterial vaginosis) 11/27/2012   Celiac disease    Cough due to ACE inhibitor 04/25/2019   Depression    Essential hypertension    Family history of adverse reaction to anesthesia    MOM-HARD TIME WAKING UP   GERD (gastroesophageal reflux disease)    Migraine with visual aura    MIGRAINES   UTI (lower urinary tract infection)    Past Surgical History:  Procedure Laterality Date   ABDOMINAL HYSTERECTOMY     BICEPT TENODESIS Left 01/27/2023   Procedure: BICEPS TENODESIS;  Surgeon: Cammy Copa, MD;  Location: [facility] OR;  Service: Orthopedics;  Laterality: Left;   BLADDER SUSPENSION     COLONOSCOPY WITH PROPOFOL N/A 05/11/2022   Procedure: COLONOSCOPY WITH PROPOFOL;  Surgeon: Wyline Mood, MD;  Location: [facility] ENDOSCOPY;  Service: Gastroenterology;  Laterality: N/A;   ESOPHAGOGASTRODUODENOSCOPY (EGD) WITH PROPOFOL N/A 05/11/2022   Procedure: ESOPHAGOGASTRODUODENOSCOPY (EGD) WITH PROPOFOL;  Surgeon: Wyline Mood, MD;  Location: [facility] ENDOSCOPY;  Service: Gastroenterology;  Laterality: N/A;   EXPLORATORY LAPAROTOMY     IUD REMOVAL  07/25/2017   Procedure: INTRAUTERINE DEVICE (IUD) REMOVAL;  Surgeon: Linzie Collin, MD;  Location: [facility] ORS;  Service: Gynecology;;   LAPAROSCOPIC ASSISTED VAGINAL HYSTERECTOMY Bilateral 07/25/2017   Procedure: LAPAROSCOPIC  ASSISTED VAGINAL HYSTERECTOMY WITH BILATERAL SALPING OOPHERECTOMY;  Surgeon: Linzie Collin, MD;  Location: [facility] ORS;  Service: Gynecology;  Laterality: Bilateral;   SHOULDER ARTHROSCOPY WITH OPEN ROTATOR CUFF REPAIR AND DISTAL CLAVICLE ACROMINECTOMY Left 01/27/2023   Procedure: LEFT SHOULDER ARTHROSCOPY, DEBRIDEMENT, DISTAL CLAVICLE EXCISION, MINI OPEN ROTATOR CUFF TEAR REPAIR;  Surgeon: Cammy Copa, MD;  Location: [facility] OR;  Service: Orthopedics;  Laterality: Left;   TUBAL LIGATION     Patient Active Problem List   Diagnosis Date Noted   Vertigo 02/25/2023   Complete tear of left rotator cuff 02/08/2023   Biceps tendonitis, left 02/08/2023   Arthritis of left acromioclavicular joint 02/08/2023   Memory change 12/06/2022   Excessive daytime sleepiness 12/06/2022   Prediabetes 07/29/2022   Persistent cough for 3 weeks or longer 01/21/2022   Iron deficiency anemia 11/30/2021   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) 11/27/2021   Vitamin D deficiency 11/27/2021   Chronic fatigue 11/27/2021   Paranoid delusion (HCC) 11/27/2021   GERD (gastroesophageal reflux disease) 04/25/2019   Hyperlipidemia 03/02/2019   Essential hypertension 02/18/2019   Recurrent cystitis 11/27/2012   Migraine with visual aura 09/19/2011   Chronic constipation 06/25/2011    PCP: Doreene Nest, NP  REFERRING PROVIDER: Cammy Copa, MD  REFERRING DIAG:  Diagnosis  [phone] (ICD-10-CM) - Complete tear of left rotator cuff, unspecified whether traumatic  THERAPY DIAG:  Abnormal posture  Muscle weakness (generalized)  Stiffness of left shoulder, not elsewhere classified  Chronic left shoulder pain  Rationale for Evaluation and Treatment: Rehabilitation  ONSET DATE: 01/27/2023 RTC repair  SUBJECTIVE:                                                                                                                                                                                       SUBJECTIVE STATEMENT: Lalah has been off pain meds since last Friday.  Sleep is now normal.  An occasional ibuprofen manages pain.  Hand dominance: Right  PERTINENT HISTORY: HTN, migraines, left biceps tenodesis, 01/27/2023 Lt open RTC repair, vertigo, pre-diabetes, HLD  PAIN:  Are you having pain? Yes: NPRS scale: 1-7/10 on a 10/10 this week on a 10/10 Pain location: Left shoulder Pain description: Mostly achy, can be sharp, throbbing Aggravating factors: Moving the shoulder too fast Relieving factors: Heat, movements and exercises   PRECAUTIONS: Other: RTC repair  RED FLAGS: None   WEIGHT BEARING RESTRICTIONS:  Stay off the left shoulder  FALLS:  Has patient fallen in last 6 months? No  LIVING ENVIRONMENT: Lives with: lives with their family, lives with their spouse, and lives with an adult companion Lives in: House/apartment Stairs:  No issues Has following equipment at home: None  OCCUPATION: Works in a Personal assistant, works with machines, sits and stands  PLOF:  Was struggling before surgery  PATIENT GOALS: Be able to get back to work and normal function without pain or restriction   OBJECTIVE:  Note: Objective measures were completed at Evaluation unless otherwise noted.  DIAGNOSTIC FINDINGS: Pre-surgery: IMPRESSION: 1. Full-thickness tear of the supraspinatus anteriorly in the region of the myotendinous junction. 2. Mild to moderate rotator cuff tendinosis. 3. Focal tendinosis with probable interstitial tear of the long head biceps tendon in the groove entry zone. 4. Type 2 AC joint injury.  PATIENT SURVEYS:  FOTO 32 (Goal 60 in 17 visit)  COGNITION: Overall cognitive status: Within functional limits for tasks assessed     SENSATION: No complaints of peripheral pain or paresthesias  POSTURE: Significant for mild forward head, internally rotated and protracted shoulders  UPPER EXTREMITY ROM:   Passive ROM  Left/Right 03/07/2023 Left 03/25/2023 Left 03/30/23  Shoulder flexion 95/165 130 A: 137 (supine)  Shoulder extension     Shoulder abduction     Shoulder adduction     Shoulder internal rotation 30/75 50   Shoulder external rotation 25/95 55   Elbow flexion     Elbow extension     Wrist flexion     Wrist extension     Wrist ulnar deviation  Wrist radial deviation     Wrist pronation     Wrist supination     (Blank rows = not tested)  UPPER EXTREMITY STRENGTH: Deferred secondary to being less than 6 weeks post rotator cuff repair at evaluation  In pounds with hand-held dynamometer Left/Right 03/07/2023   Shoulder flexion    Shoulder extension    Shoulder abduction    Shoulder adduction    Shoulder internal rotation    Shoulder external rotation    Middle trapezius    Lower trapezius    Elbow flexion    Elbow extension    Wrist flexion    Wrist extension    Wrist ulnar deviation    Wrist radial deviation    Wrist pronation    Wrist supination    Grip strength (lbs)    (Blank rows = not tested)    TODAY'S TREATMENT:                                                                                                                                         DATE:  03/30/23 TherEx Supine AA shoulder flexion with 5 sec holds at end range x 20 reps Supine Lt active shoulder flexion with 5 sec end range hold x 20 reps; 1# Supine Lt protraction with 2#; 2x10; 3 sec hold Seated scapular retraction 2x10; 5 sec hold Pulleys flexion and scaption x 3 min each Wall ladder flexion and scaption on Lt x 10 reps each Rows L2 band 2x10; 5 sec hold    03/25/2023 Supine arm raises/scapular protraction 20 x 3 seconds with 2#  Supine shoulder IR/ER stretch 20 x 10 seconds in each direction (abduction to 70 degrees, elbow on several pillows so elbow is slightly higher than shoulder, avoid shoulder hiking during the exercise) Supine shoulder flexion (palm faces in, protract 1st, elbow in  by ear) 10 x 5 seconds Side-lie shoulder ER 10 x 0#  Shoulder blade pinches 10 x 5 seconds  Side-lie ER 10 x 3 seconds   03/18/23 TherEx AAROM supine 1# dowel x14- had to stop due to shoulder burning/catching   MHP after flexion AAROM due to severe mm spasm/cramping anterior shoulder/chest X8 min (not included in billing)   L shoulder isometrics 10-25% effort: flexion 10x3 seconds, ABD 10x3 seconds  Manual  PROM/stretching all directions as appropriate/tolerated   03/07/2023  Codman's forward and back 20 times  Supine arm raises/scapular protraction 10 x 3 seconds  Supine shoulder IR/ER stretch 10 x 10 seconds in each direction (abduction to 70 degrees, elbow on several pillows so elbow is slightly higher than shoulder, avoid shoulder hiking during the exercise)  Shoulder blade pinches 10 x 5 seconds   Functional Activities:  Reviewed exam findings, reviewed post rotator cuff protocol including expectations at different timelines with emphasis on 90% active range of motion and 60% strength at 12 weeks post-surgery.  PATIENT EDUCATION:  Education details: See above Person educated: Patient Education method: Explanation, Demonstration, Tactile cues, Verbal cues, and Handouts Education comprehension: verbalized understanding, returned demonstration, verbal cues required, tactile cues required, and needs further education  HOME EXERCISE PROGRAM: Access Code: [email or web address] URL: [email or web address] Date: 03/25/2023 Prepared by: Pauletta Browns  Exercises - Standing Scapular Retraction  - 5 x daily - 7 x weekly - 1 sets - 5 reps - 5 second hold - Supine Scapular Protraction in Flexion with Dumbbells  - 2-3 x daily - 7 x weekly - 1 sets - 20 reps - 3 seconds hold - Supine Shoulder Internal Rotation Stretch  - 2-3 x daily - 7 x weekly - 1 sets - 10-20 reps - 10 seconds hold - Supine Shoulder External Rotation Stretch  - 2-3 x daily - 7 x weekly - 1 sets - 20 reps - 10  seconds hold - Supine Shoulder Flexion Extension Full Range AROM  - 2-3 x daily - 7 x weekly - 1 sets - 10 reps - 5 seconds hold - Sidelying Shoulder External Rotation Dumbbell  - 2 x daily - 7 x weekly - 10 reps   ASSESSMENT:  CLINICAL IMPRESSION: Pt tolerated session well today with focus on light strengthening and maximizing ROM.  Expected soreness reported following session. Will continue to benefit from PT to maximize function.   EVAL: Patient is a 57 y.o. female who was seen today for physical therapy evaluation and treatment for  Diagnosis  M75.122 (ICD-10-CM) - Complete tear of left rotator cuff, unspecified whether traumatic  .  [name] will be 6 weeks post surgery on Thanksgiving day.  She has capsular tightness, pain and functional impairments consistent with being less than 6 weeks status post left rotator cuff repair.  She was set up on a comprehensive rehabilitation program with early emphasis on scapular strength, shoulder active range of motion and capsular flexibility.  We will increase appropriate resistive strengthening by week 10 post-surgery with expectations of 90% active range of motion and 60% strength at her 12-week post-surgery anniversary.  OBJECTIVE IMPAIRMENTS: decreased activity tolerance, decreased endurance, decreased knowledge of condition, decreased ROM, decreased strength, decreased safety awareness, increased edema, impaired perceived functional ability, impaired flexibility, impaired UE functional use, postural dysfunction, and pain.   ACTIVITY LIMITATIONS: carrying, lifting, and reach over head  PARTICIPATION LIMITATIONS: community activity and occupation  PERSONAL FACTORS: HTN, migraines, left biceps tenodesis, 01/27/2023 Lt open RTC repair, vertigo, pre-diabetes, HLD are also affecting patient's functional outcome.   REHAB POTENTIAL: Good  CLINICAL DECISION MAKING: Stable/uncomplicated  EVALUATION COMPLEXITY: Low   GOALS: Goals reviewed with  patient? Yes  SHORT TERM GOALS: Target date: 04/04/2023  Ronneka will be independent with her day 1 HEP  Baseline: Started 03/07/2023 Goal status: Met 03/25/2023  2.  Improve left shoulder passive range of motion for flexion to 150 degrees; ER to 70 degrees; IR to 50 degrees. Baseline: 95; 25 and 30 respectively Goal status: Partially met 03/25/2023   LONG TERM GOALS: Target date: 05/02/2023  Improve FOTO to 60 in 17 visits Baseline: 32 Goal status: INITIAL  2.  Improve left shoulder pain to consistently 0-3/10 on the Numeric Pain Rating Scale Baseline: 2-7/10 Goal status: On Going 03/25/2023  3.  Improve left shoulder active range of motion to 90% of flexion 170 degrees; external rotation 90 degrees; internal rotation 70 degrees and horizontal adduction 40 degrees Baseline: See objective Goal status: INITIAL  4.  Improve left shoulder strength for IR and ER to 60% or  better as compared to the uninvolved right Baseline: Deferred at evaluation secondary to being less than 6 weeks postsurgery Goal status: INITIAL  5.  Lilea will be independent with her long-term HEP at DC Baseline: Started 03/07/2023 Goal status: INITIAL  PLAN:  PT FREQUENCY: 1-2x/week  PT DURATION: 8 weeks  PLANNED INTERVENTIONS: 97110-Therapeutic exercises, 97530- Therapeutic activity, 97112- Neuromuscular re-education, 97535- Self Care, 16109- Manual therapy, 97016- Vasopneumatic device, Patient/Family education, and Cryotherapy  PLAN FOR NEXT SESSION: Continue ROM and light strengthening, Active range of motion and scapular strengthening appropriate for rotator cuff repair; now 8 weeks post-op as of 03/25/23.  NEXT MD VISIT: 04/25/23  Clarita Crane, PT, DPT 03/30/23 10:14 AM

## 2023-03-31 ENCOUNTER — Other Ambulatory Visit: Payer: Self-pay | Admitting: Internal Medicine

## 2023-03-31 ENCOUNTER — Inpatient Hospital Stay: Payer: 59

## 2023-03-31 VITALS — BP 127/87 | HR 78 | Temp 98.4°F | Resp 17

## 2023-03-31 DIAGNOSIS — D509 Iron deficiency anemia, unspecified: Secondary | ICD-10-CM | POA: Diagnosis not present

## 2023-03-31 MED ORDER — IRON SUCROSE 20 MG/ML IV SOLN
200.0000 mg | Freq: Once | INTRAVENOUS | Status: AC
  Administered 2023-03-31: 200 mg via INTRAVENOUS
  Filled 2023-03-31: qty 10

## 2023-03-31 MED ORDER — SODIUM CHLORIDE 0.9% FLUSH
10.0000 mL | Freq: Once | INTRAVENOUS | Status: AC | PRN
Start: 2023-03-31 — End: 2023-03-31
  Administered 2023-03-31: 10 mL
  Filled 2023-03-31: qty 10

## 2023-03-31 NOTE — Progress Notes (Signed)
Patient tolerated Venofer infusion well. Explained recommendation of 30 min post monitoring. Patient refused to wait post monitoring. Educated on what signs to watch for & to call with any concerns. No questions/symptoms, discharged. Stable

## 2023-03-31 NOTE — Patient Instructions (Signed)
Iron Sucrose Injection What is this medication? IRON SUCROSE (EYE ern SOO krose) treats low levels of iron (iron deficiency anemia) in people with kidney disease. Iron is a mineral that plays an important role in making red blood cells, which carry oxygen from your lungs to the rest of your body. This medicine may be used for other purposes; ask your health care provider or pharmacist if you have questions. COMMON BRAND NAME(S): Venofer What should I tell my care team before I take this medication? They need to know if you have any of these conditions: Anemia not caused by low iron levels Heart disease High levels of iron in the blood Kidney disease Liver disease An unusual or allergic reaction to iron, other medications, foods, dyes, or preservatives Pregnant or trying to get pregnant Breastfeeding How should I use this medication? This medication is for infusion into a vein. It is given in a hospital or clinic setting. Talk to your care team about the use of this medication in children. While this medication may be prescribed for children as young as 2 years for selected conditions, precautions do apply. Overdosage: If you think you have taken too much of this medicine contact a poison control center or emergency room at once. NOTE: This medicine is only for you. Do not share this medicine with others. What if I miss a dose? Keep appointments for follow-up doses. It is important not to miss your dose. Call your care team if you are unable to keep an appointment. What may interact with this medication? Do not take this medication with any of the following: Deferoxamine Dimercaprol Other iron products This medication may also interact with the following: Chloramphenicol Deferasirox This list may not describe all possible interactions. Give your health care provider a list of all the medicines, herbs, non-prescription drugs, or dietary supplements you use. Also tell them if you smoke,  drink alcohol, or use illegal drugs. Some items may interact with your medicine. What should I watch for while using this medication? Visit your care team regularly. Tell your care team if your symptoms do not start to get better or if they get worse. You may need blood work done while you are taking this medication. You may need to follow a special diet. Talk to your care team. Foods that contain iron include: whole grains/cereals, dried fruits, beans, or peas, leafy green vegetables, and organ meats (liver, kidney). What side effects may I notice from receiving this medication? Side effects that you should report to your care team as soon as possible: Allergic reactions--skin rash, itching, hives, swelling of the face, lips, tongue, or throat Low blood pressure--dizziness, feeling faint or lightheaded, blurry vision Shortness of breath Side effects that usually do not require medical attention (report to your care team if they continue or are bothersome): Flushing Headache Joint pain Muscle pain Nausea Pain, redness, or irritation at injection site This list may not describe all possible side effects. Call your doctor for medical advice about side effects. You may report side effects to FDA at 1-800-FDA-1088. Where should I keep my medication? This medication is given in a hospital or clinic. It will not be stored at home. NOTE: This sheet is a summary. It may not cover all possible information. If you have questions about this medicine, talk to your doctor, pharmacist, or health care provider.  2024 Elsevier/Gold Standard (2022-09-03 00:00:00)

## 2023-04-01 ENCOUNTER — Ambulatory Visit (INDEPENDENT_AMBULATORY_CARE_PROVIDER_SITE_OTHER): Payer: 59 | Admitting: Rehabilitative and Restorative Service Providers"

## 2023-04-01 ENCOUNTER — Encounter: Payer: Self-pay | Admitting: Rehabilitative and Restorative Service Providers"

## 2023-04-01 DIAGNOSIS — M6281 Muscle weakness (generalized): Secondary | ICD-10-CM | POA: Diagnosis not present

## 2023-04-01 DIAGNOSIS — M25512 Pain in left shoulder: Secondary | ICD-10-CM | POA: Diagnosis not present

## 2023-04-01 DIAGNOSIS — M25612 Stiffness of left shoulder, not elsewhere classified: Secondary | ICD-10-CM

## 2023-04-01 DIAGNOSIS — R293 Abnormal posture: Secondary | ICD-10-CM | POA: Diagnosis not present

## 2023-04-01 DIAGNOSIS — G8929 Other chronic pain: Secondary | ICD-10-CM

## 2023-04-01 NOTE — Therapy (Signed)
OUTPATIENT PHYSICAL THERAPY SHOULDER TREATMENT   Patient Name: [name] MRN: [id number] DOB:06/29/1965, 58 y.o., female Today's Date: 04/01/2023  END OF SESSION:  PT End of Session - 04/01/23 1034     Visit Number 5    Number of Visits 17    Date for PT Re-Evaluation 05/02/23    Authorization Type [employer]    Progress Note Due on Visit 17    PT Start Time 1010    PT Stop Time 1051    PT Time Calculation (min) 41 min    Activity Tolerance Patient tolerated treatment well;No increased pain    Behavior During Therapy [name] for tasks assessed/performed                Past Medical History:  Diagnosis Date   Anemia    BV (bacterial vaginosis) 11/27/2012   Celiac disease    Cough due to ACE inhibitor 04/25/2019   Depression    Essential hypertension    Family history of adverse reaction to anesthesia    MOM-HARD TIME WAKING UP   GERD (gastroesophageal reflux disease)    Migraine with visual aura    MIGRAINES   UTI (lower urinary tract infection)    Past Surgical History:  Procedure Laterality Date   ABDOMINAL HYSTERECTOMY     BICEPT TENODESIS Left 01/27/2023   Procedure: BICEPS TENODESIS;  Surgeon: Cammy Copa, MD;  Location: [facility] OR;  Service: Orthopedics;  Laterality: Left;   BLADDER SUSPENSION     COLONOSCOPY WITH PROPOFOL N/A 05/11/2022   Procedure: COLONOSCOPY WITH PROPOFOL;  Surgeon: Wyline Mood, MD;  Location: [facility] ENDOSCOPY;  Service: Gastroenterology;  Laterality: N/A;   ESOPHAGOGASTRODUODENOSCOPY (EGD) WITH PROPOFOL N/A 05/11/2022   Procedure: ESOPHAGOGASTRODUODENOSCOPY (EGD) WITH PROPOFOL;  Surgeon: Wyline Mood, MD;  Location: [facility] ENDOSCOPY;  Service: Gastroenterology;  Laterality: N/A;   EXPLORATORY LAPAROTOMY     IUD REMOVAL  07/25/2017   Procedure: INTRAUTERINE DEVICE (IUD) REMOVAL;  Surgeon: Linzie Collin, MD;  Location: [facility] ORS;  Service: Gynecology;;   LAPAROSCOPIC ASSISTED VAGINAL HYSTERECTOMY Bilateral 07/25/2017   Procedure: LAPAROSCOPIC  ASSISTED VAGINAL HYSTERECTOMY WITH BILATERAL SALPING OOPHERECTOMY;  Surgeon: Linzie Collin, MD;  Location: [facility] ORS;  Service: Gynecology;  Laterality: Bilateral;   SHOULDER ARTHROSCOPY WITH OPEN ROTATOR CUFF REPAIR AND DISTAL CLAVICLE ACROMINECTOMY Left 01/27/2023   Procedure: LEFT SHOULDER ARTHROSCOPY, DEBRIDEMENT, DISTAL CLAVICLE EXCISION, MINI OPEN ROTATOR CUFF TEAR REPAIR;  Surgeon: Cammy Copa, MD;  Location: [facility] OR;  Service: Orthopedics;  Laterality: Left;   TUBAL LIGATION     Patient Active Problem List   Diagnosis Date Noted   Vertigo 02/25/2023   Complete tear of left rotator cuff 02/08/2023   Biceps tendonitis, left 02/08/2023   Arthritis of left acromioclavicular joint 02/08/2023   Memory change 12/06/2022   Excessive daytime sleepiness 12/06/2022   Prediabetes 07/29/2022   Persistent cough for 3 weeks or longer 01/21/2022   Iron deficiency anemia 11/30/2021   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) 11/27/2021   Vitamin D deficiency 11/27/2021   Chronic fatigue 11/27/2021   Paranoid delusion (HCC) 11/27/2021   GERD (gastroesophageal reflux disease) 04/25/2019   Hyperlipidemia 03/02/2019   Essential hypertension 02/18/2019   Recurrent cystitis 11/27/2012   Migraine with visual aura 09/19/2011   Chronic constipation 06/25/2011    PCP: Doreene Nest, NP  REFERRING PROVIDER: Cammy Copa, MD  REFERRING DIAG:  Diagnosis  [phone] (ICD-10-CM) - Complete tear of left rotator cuff, unspecified whether traumatic  THERAPY DIAG:  Abnormal posture  Muscle weakness (generalized)  Stiffness of left shoulder, not elsewhere classified  Chronic left shoulder pain  Rationale for Evaluation and Treatment: Rehabilitation  ONSET DATE: 01/27/2023 RTC repair  SUBJECTIVE:                                                                                                                                                                                       SUBJECTIVE STATEMENT: Only 1 pain pill this week.  She is sleeping normally.  Good HEP compliance.  Hand dominance: Right  PERTINENT HISTORY: HTN, migraines, left biceps tenodesis, 01/27/2023 Lt open RTC repair, vertigo, pre-diabetes, HLD  PAIN:  Are you having pain? Yes: NPRS scale: 0-5/10 on a 10/10 this week on a 10/10 Pain location: Left shoulder Pain description: Mostly achy, can be sharp, throbbing Aggravating factors: Moving the shoulder too fast Relieving factors: Heat, movements and exercises   PRECAUTIONS: Other: RTC repair  RED FLAGS: None   WEIGHT BEARING RESTRICTIONS:  Stay off the left shoulder  FALLS:  Has patient fallen in last 6 months? No  LIVING ENVIRONMENT: Lives with: lives with their family, lives with their spouse, and lives with an adult companion Lives in: House/apartment Stairs:  No issues Has following equipment at home: None  OCCUPATION: Works in a Personal assistant, works with machines, sits and stands  PLOF:  Was struggling before surgery  PATIENT GOALS: Be able to get back to work and normal function without pain or restriction   OBJECTIVE:  Note: Objective measures were completed at Evaluation unless otherwise noted.  DIAGNOSTIC FINDINGS: Pre-surgery: IMPRESSION: 1. Full-thickness tear of the supraspinatus anteriorly in the region of the myotendinous junction. 2. Mild to moderate rotator cuff tendinosis. 3. Focal tendinosis with probable interstitial tear of the long head biceps tendon in the groove entry zone. 4. Type 2 AC joint injury.  PATIENT SURVEYS:  FOTO 32 (Goal 60 in 17 visit)  COGNITION: Overall cognitive status: Within functional limits for tasks assessed     SENSATION: No complaints of peripheral pain or paresthesias  POSTURE: Significant for mild forward head, internally rotated and protracted shoulders  UPPER EXTREMITY ROM:   Passive ROM Left/Right 03/07/2023 Left  03/25/2023 Left 03/30/23 Left 04/01/2023  Shoulder flexion 95/165 130 A: 137 (supine) 135  Shoulder extension      Shoulder abduction      Shoulder horizontal adduction    40  Shoulder internal rotation 30/75 50  65  Shoulder external rotation 25/95 55  70  Elbow flexion      Elbow extension      Wrist flexion      Wrist extension  Wrist ulnar deviation      Wrist radial deviation      Wrist pronation      Wrist supination      (Blank rows = not tested)  UPPER EXTREMITY STRENGTH: Deferred secondary to being less than 6 weeks post rotator cuff repair at evaluation  In pounds with hand-held dynamometer Left/Right 03/07/2023   Shoulder flexion    Shoulder extension    Shoulder abduction    Shoulder adduction    Shoulder internal rotation    Shoulder external rotation    Middle trapezius    Lower trapezius    Elbow flexion    Elbow extension    Wrist flexion    Wrist extension    Wrist ulnar deviation    Wrist radial deviation    Wrist pronation    Wrist supination    Grip strength (lbs)    (Blank rows = not tested)    TODAY'S TREATMENT:                                                                                                                                         DATE:  04/01/2023 Supine arm raises/scapular protraction 20 x 3 seconds with 3# and 20 x 3 seconds with 4# Supine shoulder IR/ER stretch 20 x 10 seconds in each direction (abduction to 70 degrees, elbow on several pillows so elbow is slightly higher than shoulder, avoid shoulder hiking during the exercise) Supine shoulder flexion (palm faces in, protract 1st, elbow in by ear) 10 x 10 seconds )# and 10 x 10 seconds 1# Side-lie shoulder ER 10 x 1#, 3 second hold Shoulder blade pinches 10 x 5 seconds  Functional Activities (reaching and overhead function): Pulley flexion 10 x 10 seconds (palms in, protract 1st) Reviewed and updated HEP   03/30/23 TherEx Supine AA shoulder flexion with 5 sec holds  at end range x 20 reps Supine Lt active shoulder flexion with 5 sec end range hold x 20 reps; 1# Supine Lt protraction with 2#; 2x10; 3 sec hold Seated scapular retraction 2x10; 5 sec hold Pulleys flexion and scaption x 3 min each Wall ladder flexion and scaption on Lt x 10 reps each Rows L2 band 2x10; 5 sec hold   03/25/2023 Supine arm raises/scapular protraction 20 x 3 seconds with 2#  Supine shoulder IR/ER stretch 20 x 10 seconds in each direction (abduction to 70 degrees, elbow on several pillows so elbow is slightly higher than shoulder, avoid shoulder hiking during the exercise) Supine shoulder flexion (palm faces in, protract 1st, elbow in by ear) 10 x 5 seconds Side-lie shoulder ER 10 x 0#  Shoulder blade pinches 10 x 5 seconds  Side-lie ER 10 x 3 seconds   PATIENT EDUCATION: Education details: See above Person educated: Patient Education method: Explanation, Demonstration, Tactile cues, Verbal cues, and Handouts Education comprehension: verbalized understanding, returned demonstration, verbal cues required,  tactile cues required, and needs further education  HOME EXERCISE PROGRAM: Access Code: [email or web address] URL: [email or web address] Date: 04/01/2023 Prepared by: Pauletta Browns  Exercises - Standing Scapular Retraction  - 5 x daily - 7 x weekly - 1 sets - 5 reps - 5 second hold - Supine Scapular Protraction in Flexion with Dumbbells  - 2-3 x daily - 7 x weekly - 1 sets - 20 reps - 3 seconds hold - Supine Shoulder Internal Rotation Stretch  - 2-3 x daily - 7 x weekly - 1 sets - 10-20 reps - 10 seconds hold - Supine Shoulder External Rotation Stretch  - 2-3 x daily - 7 x weekly - 1 sets - 20 reps - 10 seconds hold - Supine Shoulder Flexion Extension Full Range AROM  - 2-3 x daily - 7 x weekly - 1 sets - 10 reps - 10 seconds hold - Sidelying Shoulder External Rotation Dumbbell  - 2 x daily - 7 x weekly - 10 reps   ASSESSMENT:  CLINICAL IMPRESSION: AROM is now  84% of the long-term goal with flexion being most limited.  We made appropriate progressions to scapular and rotator cuff strength along with flexion AROM today.  Continue current plan to meet long-term goals.   EVAL: Patient is a 57 y.o. female who was seen today for physical therapy evaluation and treatment for  Diagnosis  M75.122 (ICD-10-CM) - Complete tear of left rotator cuff, unspecified whether traumatic  .  Zayn will be 6 weeks post surgery on Thanksgiving day.  She has capsular tightness, pain and functional impairments consistent with being less than 6 weeks status post left rotator cuff repair.  She was set up on a comprehensive rehabilitation program with early emphasis on scapular strength, shoulder active range of motion and capsular flexibility.  We will increase appropriate resistive strengthening by week 10 post-surgery with expectations of 90% active range of motion and 60% strength at her 12-week post-surgery anniversary.  OBJECTIVE IMPAIRMENTS: decreased activity tolerance, decreased endurance, decreased knowledge of condition, decreased ROM, decreased strength, decreased safety awareness, increased edema, impaired perceived functional ability, impaired flexibility, impaired UE functional use, postural dysfunction, and pain.   ACTIVITY LIMITATIONS: carrying, lifting, and reach over head  PARTICIPATION LIMITATIONS: community activity and occupation  PERSONAL FACTORS: HTN, migraines, left biceps tenodesis, 01/27/2023 Lt open RTC repair, vertigo, pre-diabetes, HLD are also affecting patient's functional outcome.   REHAB POTENTIAL: Good  CLINICAL DECISION MAKING: Stable/uncomplicated  EVALUATION COMPLEXITY: Low   GOALS: Goals reviewed with patient? Yes  SHORT TERM GOALS: Target date: 04/04/2023  Paricia will be independent with her day 1 HEP  Baseline: Started 03/07/2023 Goal status: Met 03/25/2023  2.  Improve left shoulder passive range of motion for flexion to 150  degrees; ER to 70 degrees; IR to 50 degrees. Baseline: 95; 25 and 30 respectively Goal status: Partially met 04/01/2023   LONG TERM GOALS: Target date: 05/02/2023  Improve FOTO to 60 in 17 visits Baseline: 32 Goal status: INITIAL  2.  Improve left shoulder pain to consistently 0-3/10 on the Numeric Pain Rating Scale Baseline: 2-7/10 Goal status: On Going 04/01/2023  3.  Improve left shoulder active range of motion to 90% of flexion 170 degrees; external rotation 90 degrees; internal rotation 70 degrees and horizontal adduction 40 degrees Baseline: See objective Goal status: On Going (84%) 04/01/2023  4.  Improve left shoulder strength for IR and ER to 60% or better as compared to the uninvolved right Baseline: Deferred at evaluation secondary to being  less than 6 weeks postsurgery Goal status: INITIAL  5.  Hayliegh will be independent with her long-term HEP at DC Baseline: Started 03/07/2023 Goal status: INITIAL  PLAN:  PT FREQUENCY: 1-2x/week  PT DURATION: 8 weeks  PLANNED INTERVENTIONS: 97110-Therapeutic exercises, 97530- Therapeutic activity, 97112- Neuromuscular re-education, 97535- Self Care, 82956- Manual therapy, 97016- Vasopneumatic device, Patient/Family education, and Cryotherapy  PLAN FOR NEXT SESSION: Continue ROM and appropriate strengthening, Active range of motion and scapular strengthening appropriate for rotator cuff repair; now completed 9 weeks, started 10 weeks post-op as of 04/01/23.  NEXT MD VISIT: 04/25/23  Cherlyn Cushing PT, MPT 04/01/23 10:59 AM

## 2023-04-04 ENCOUNTER — Telehealth: Payer: Self-pay | Admitting: Physical Therapy

## 2023-04-04 ENCOUNTER — Encounter: Payer: 59 | Admitting: Physical Therapy

## 2023-04-04 NOTE — Telephone Encounter (Signed)
I called pt to follow up after she missed her 11:45 PT appointment today. I left a message for pt to call our clinic at [phone] to try to reschedule and if not her next appointment is scheduled on Friday 04/08/23 at 10:15.  Narda Amber, PT, MPT 04/04/23 12:11 PM

## 2023-04-07 ENCOUNTER — Inpatient Hospital Stay: Payer: 59

## 2023-04-08 ENCOUNTER — Inpatient Hospital Stay: Payer: 59

## 2023-04-08 ENCOUNTER — Ambulatory Visit (INDEPENDENT_AMBULATORY_CARE_PROVIDER_SITE_OTHER): Payer: 59 | Admitting: Rehabilitative and Restorative Service Providers"

## 2023-04-08 ENCOUNTER — Ambulatory Visit (HOSPITAL_COMMUNITY)
Admission: EM | Admit: 2023-04-08 | Discharge: 2023-04-08 | Disposition: A | Discharge status: Home / Self Care | Payer: 59 | Attending: Family Medicine | Admitting: Family Medicine

## 2023-04-08 ENCOUNTER — Ambulatory Visit (INDEPENDENT_AMBULATORY_CARE_PROVIDER_SITE_OTHER): Payer: 59

## 2023-04-08 ENCOUNTER — Encounter (HOSPITAL_COMMUNITY): Payer: Self-pay

## 2023-04-08 ENCOUNTER — Encounter: Payer: Self-pay | Admitting: Rehabilitative and Restorative Service Providers"

## 2023-04-08 DIAGNOSIS — M25512 Pain in left shoulder: Secondary | ICD-10-CM

## 2023-04-08 DIAGNOSIS — M25521 Pain in right elbow: Secondary | ICD-10-CM

## 2023-04-08 DIAGNOSIS — M6281 Muscle weakness (generalized): Secondary | ICD-10-CM

## 2023-04-08 DIAGNOSIS — M25612 Stiffness of left shoulder, not elsewhere classified: Secondary | ICD-10-CM

## 2023-04-08 DIAGNOSIS — R293 Abnormal posture: Secondary | ICD-10-CM

## 2023-04-08 DIAGNOSIS — G8929 Other chronic pain: Secondary | ICD-10-CM

## 2023-04-08 MED ORDER — KETOROLAC TROMETHAMINE 30 MG/ML IJ SOLN
30.0000 mg | Freq: Once | INTRAMUSCULAR | Status: AC
  Administered 2023-04-08: 30 mg via INTRAMUSCULAR

## 2023-04-08 MED ORDER — KETOROLAC TROMETHAMINE 30 MG/ML IJ SOLN
INTRAMUSCULAR | Status: AC
Start: 2023-04-08 — End: ?
  Filled 2023-04-08: qty 1

## 2023-04-08 NOTE — ED Provider Notes (Signed)
[facility]    CSN: [id number] Arrival date & time: 04/08/23  1414      History   Chief Complaint Chief Complaint  Patient presents with   Fall   Right Arm Injury    HPI [name] is a 57 y.o. female.    Fall  Here for pain in her right elbow and forearm.  Today she was walking back to the mailbox and possibly tripped on a root on her driveway and fell onto her right arm.  She has some pain in her right elbow but mainly in the proximal right forearm.  She had taken oxycodone earlier for some left shoulder pain.  She had shoulder surgery about 10 weeks ago and is "working on getting off the oxycodone" but she had taken 1 today because she had fallen onto her left shoulder a few days ago.  She has talked to physical therapy about that fall and pain  Past Medical History:  Diagnosis Date   Anemia    BV (bacterial vaginosis) 11/27/2012   Celiac disease    Cough due to ACE inhibitor 04/25/2019   Depression    Essential hypertension    Family history of adverse reaction to anesthesia    MOM-HARD TIME WAKING UP   GERD (gastroesophageal reflux disease)    Migraine with visual aura    MIGRAINES   UTI (lower urinary tract infection)     Patient Active Problem List   Diagnosis Date Noted   Vertigo 02/25/2023   Complete tear of left rotator cuff 02/08/2023   Biceps tendonitis, left 02/08/2023   Arthritis of left acromioclavicular joint 02/08/2023   Memory change 12/06/2022   Excessive daytime sleepiness 12/06/2022   Prediabetes 07/29/2022   Persistent cough for 3 weeks or longer 01/21/2022   Iron deficiency anemia 11/30/2021   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) 11/27/2021   Vitamin D deficiency 11/27/2021   Chronic fatigue 11/27/2021   Paranoid delusion (HCC) 11/27/2021   GERD (gastroesophageal reflux disease) 04/25/2019   Hyperlipidemia 03/02/2019   Essential hypertension 02/18/2019   Recurrent cystitis  11/27/2012   Migraine with visual aura 09/19/2011   Chronic constipation 06/25/2011    Past Surgical History:  Procedure Laterality Date   ABDOMINAL HYSTERECTOMY     BICEPT TENODESIS Left 01/27/2023   Procedure: BICEPS TENODESIS;  Surgeon: Cammy Copa, MD;  Location: [facility] OR;  Service: Orthopedics;  Laterality: Left;   BLADDER SUSPENSION     COLONOSCOPY WITH PROPOFOL N/A 05/11/2022   Procedure: COLONOSCOPY WITH PROPOFOL;  Surgeon: Wyline Mood, MD;  Location: [facility] ENDOSCOPY;  Service: Gastroenterology;  Laterality: N/A;   ESOPHAGOGASTRODUODENOSCOPY (EGD) WITH PROPOFOL N/A 05/11/2022   Procedure: ESOPHAGOGASTRODUODENOSCOPY (EGD) WITH PROPOFOL;  Surgeon: Wyline Mood, MD;  Location: [facility] ENDOSCOPY;  Service: Gastroenterology;  Laterality: N/A;   EXPLORATORY LAPAROTOMY     IUD REMOVAL  07/25/2017   Procedure: INTRAUTERINE DEVICE (IUD) REMOVAL;  Surgeon: Linzie Collin, MD;  Location: [facility] ORS;  Service: Gynecology;;   LAPAROSCOPIC ASSISTED VAGINAL HYSTERECTOMY Bilateral 07/25/2017   Procedure: LAPAROSCOPIC ASSISTED VAGINAL HYSTERECTOMY WITH BILATERAL SALPING OOPHERECTOMY;  Surgeon: Linzie Collin, MD;  Location: [facility] ORS;  Service: Gynecology;  Laterality: Bilateral;   SHOULDER ARTHROSCOPY WITH OPEN ROTATOR CUFF REPAIR AND DISTAL CLAVICLE ACROMINECTOMY Left 01/27/2023   Procedure: LEFT SHOULDER ARTHROSCOPY, DEBRIDEMENT, DISTAL CLAVICLE EXCISION, MINI OPEN ROTATOR CUFF TEAR REPAIR;  Surgeon: Cammy Copa, MD;  Location: [facility] OR;  Service: Orthopedics;  Laterality: Left;  TUBAL LIGATION      OB History     Gravida  3   Para  3   Term  3   Preterm      AB      Living  3      SAB      IAB      Ectopic      Multiple      Live Births  3            Home Medications    Prior to Admission medications   Medication Sig Start Date End Date Taking? Authorizing Provider  atorvastatin (LIPITOR) 20 MG tablet Take 1 tablet (20 mg total) by mouth every evening.  For cholesterol. Patient not taking: Reported on 02/25/2023 12/06/22   Mort Sawyers, FNP  celecoxib (CELEBREX) 100 MG capsule TAKE 1 CAPSULE BY MOUTH TWICE A DAY 02/23/23   Magnant, Charles L, PA-C  ferrous sulfate 325 (65 FE) MG EC tablet Take 325 mg by mouth 3 (three) times daily with meals. Patient not taking: Reported on 02/25/2023    [provider]  FLUoxetine (PROZAC) 20 MG capsule Take 20 mg by mouth daily. Takes with 40 mg to = 60 mg daily    [provider]  FLUoxetine (PROZAC) 40 MG capsule Take 1 capsule (40 mg total) by mouth daily. Take total of 60 mg daily. Take along with 20 mg cap 11/17/22 02/15/23  Neysa Hotter, MD  losartan (COZAAR) 50 MG tablet TAKE 1 TABLET (50 MG TOTAL) BY MOUTH DAILY. FOR BLOOD PRESSURE. 06/21/22   Doreene Nest, NP  meclizine (ANTIVERT) 25 MG tablet Take 1 tablet (25 mg total) by mouth 3 (three) times daily as needed for dizziness. 02/20/23   Fisher, Roselyn Bering, PA-C  methocarbamol (ROBAXIN) 500 MG tablet Take 1 tablet (500 mg total) by mouth every 8 (eight) hours as needed for muscle spasms. 03/18/23   Magnant, Charles L, PA-C  oxycodone (OXY-IR) 5 MG capsule Take 1 capsule (5 mg total) by mouth every 12 (twelve) hours as needed. 03/18/23   Magnant, Charles L, PA-C  pantoprazole (PROTONIX) 40 MG tablet Take 1 tablet (40 mg total) by mouth 2 (two) times daily. For heartburn. 02/25/23   Doreene Nest, NP    Family History Family History  Problem Relation Age of Onset   Hypertension Mother    Stroke Mother    Cancer Father    Hypertension Maternal Grandmother    Colon cancer Maternal Grandmother    Breast cancer Maternal Grandmother    Skin cancer Maternal Grandmother    Hypertension Maternal Grandfather    Hypertension Paternal Grandmother    Diabetes Paternal Grandmother    Hypertension Paternal Grandfather    Diabetes Son 24       type 1    Social History Social History   Tobacco Use   Smoking status: Never    Smokeless tobacco: Never  Vaping Use   Vaping status: Never Used  Substance Use Topics   Alcohol use: No    Alcohol/week: 0.0 standard drinks of alcohol   Drug use: No     Allergies   Topamax [topiramate], Tramadol, Gluten meal, and Ondansetron   Review of Systems Review of Systems   Physical Exam Triage Vital Signs ED Triage Vitals  Encounter Vitals Group     BP 04/08/23 1601 123/85     Systolic BP Percentile --      Diastolic BP Percentile --  Pulse Rate 04/08/23 1601 78     Resp 04/08/23 1601 18     Temp 04/08/23 1601 98.2 F (36.8 C)     Temp Source 04/08/23 1601 Oral     SpO2 04/08/23 1601 94 %     Weight 04/08/23 1602 160 lb (72.6 kg)     Height 04/08/23 1600 5' (1.524 m)     Head Circumference --      Peak Flow --      Pain Score 04/08/23 1600 2     Pain Loc --      Pain Education --      Exclude from Growth Chart --    No data found.  Updated Vital Signs BP 123/85 (BP Location: Left Arm)   Pulse 78   Temp 98.2 F (36.8 C) (Oral)   Resp 18   Ht 5' (1.524 m)   Wt 72.6 kg   LMP  (LMP Unknown)   SpO2 94%   BMI 31.25 kg/m   Visual Acuity Right Eye Distance:   Left Eye Distance:   Bilateral Distance:    Right Eye Near:   Left Eye Near:    Bilateral Near:     Physical Exam Vitals reviewed.  Constitutional:      General: She is not in acute distress.    Appearance: She is not ill-appearing, toxic-appearing or diaphoretic.  Musculoskeletal:     Comments: There is some tenderness along the proximal forearm on the volar and radial surface.  No deformity.  Range of motion is good about the elbow and wrist.  She is able to flex and extend her fingers of the right hand.  Pulses are intact distally  Skin:    Coloration: Skin is not jaundiced or pale.  Neurological:     General: No focal deficit present.     Mental Status: She is alert and oriented to person, place, and time.  Psychiatric:        Behavior: Behavior normal.      UC  Treatments / Results  Labs (all labs ordered are listed, but only abnormal results are displayed) Labs Reviewed - No data to display  EKG   Radiology No results found.  Procedures Procedures (including critical care time)  Medications Ordered in UC Medications  ketorolac (TORADOL) 30 MG/ML injection 30 mg (has no administration in time range)    Initial Impression / Assessment and Plan / UC Course  I have reviewed the triage vital signs and the nursing notes.  Pertinent labs & imaging results that were available during my care of the patient were reviewed by me and considered in my medical decision making (see chart for details).     By my review the x-ray does not show any fracture.  She is advised of radiology over read.  Toradol injection is given here for pain Final Clinical Impressions(s) / UC Diagnoses   Final diagnoses:  Right elbow pain     Discharge Instructions      There is no broken bone by my review of the x-rays.  The radiologist will also read your x-ray, and if their interpretation differs significantly from mine, we will call you.  You have been given a shot of Toradol 30 mg today.  You can ice what ever is sore and that usually helps some  Please follow-up with your orthopedic providers about the shoulder pain.     ED Prescriptions   None    I have reviewed the PDMP during  this encounter.   Zenia Resides, MD 04/08/23 [phone]

## 2023-04-08 NOTE — Discharge Instructions (Signed)
There is no broken bone by my review of the x-rays.  The radiologist will also read your x-ray, and if their interpretation differs significantly from mine, we will call you.  You have been given a shot of Toradol 30 mg today.  You can ice what ever is sore and that usually helps some  Please follow-up with your orthopedic providers about the shoulder pain.

## 2023-04-08 NOTE — ED Triage Notes (Signed)
Pt presents with complaints of right arm pain/injury following a fall this afternoon at 1230. Pt states "I took Tylenol, oxycodone, and a muscle relaxant before I fell. I had surgery on my left shoulder approximately 10 weeks ago. I was doing great. I was coming off the pain pills but on Monday 12/23, I did fall onto my left shoulder in the middle of the night while trying to feed my granddaughter her bottle." Pt denies head injury. Pt currently rates her right arm pain a 2/10.

## 2023-04-08 NOTE — Therapy (Signed)
OUTPATIENT PHYSICAL THERAPY SHOULDER TREATMENT   Patient Name: [name] MRN: [id number] DOB:[birth year], 57 y.o., female Today's Date: 04/08/2023  END OF SESSION:  PT End of Session - 04/08/23 1212     Visit Number 6    Number of Visits 17    Date for PT Re-Evaluation 05/02/23    Authorization Type [employer]    Progress Note Due on Visit 17    PT Start Time 1017    PT Stop Time 1102    PT Time Calculation (min) 45 min    Activity Tolerance Patient tolerated treatment well;Patient limited by pain    Behavior During Therapy [facility] for tasks assessed/performed             Past Medical History:  Diagnosis Date   Anemia    BV (bacterial vaginosis) 11/27/2012   Celiac disease    Cough due to ACE inhibitor 04/25/2019   Depression    Essential hypertension    Family history of adverse reaction to anesthesia    MOM-HARD TIME WAKING UP   GERD (gastroesophageal reflux disease)    Migraine with visual aura    MIGRAINES   UTI (lower urinary tract infection)    Past Surgical History:  Procedure Laterality Date   ABDOMINAL HYSTERECTOMY     BICEPT TENODESIS Left 01/27/2023   Procedure: BICEPS TENODESIS;  Surgeon: Cammy Copa, MD;  Location: [facility] OR;  Service: Orthopedics;  Laterality: Left;   BLADDER SUSPENSION     COLONOSCOPY WITH PROPOFOL N/A 05/11/2022   Procedure: COLONOSCOPY WITH PROPOFOL;  Surgeon: Wyline Mood, MD;  Location: [facility] ENDOSCOPY;  Service: Gastroenterology;  Laterality: N/A;   ESOPHAGOGASTRODUODENOSCOPY (EGD) WITH PROPOFOL N/A 05/11/2022   Procedure: ESOPHAGOGASTRODUODENOSCOPY (EGD) WITH PROPOFOL;  Surgeon: Wyline Mood, MD;  Location: [facility] ENDOSCOPY;  Service: Gastroenterology;  Laterality: N/A;   EXPLORATORY LAPAROTOMY     IUD REMOVAL  07/25/2017   Procedure: INTRAUTERINE DEVICE (IUD) REMOVAL;  Surgeon: Linzie Collin, MD;  Location: [facility] ORS;  Service: Gynecology;;   LAPAROSCOPIC ASSISTED VAGINAL HYSTERECTOMY Bilateral 07/25/2017   Procedure: LAPAROSCOPIC  ASSISTED VAGINAL HYSTERECTOMY WITH BILATERAL SALPING OOPHERECTOMY;  Surgeon: Linzie Collin, MD;  Location: [facility] ORS;  Service: Gynecology;  Laterality: Bilateral;   SHOULDER ARTHROSCOPY WITH OPEN ROTATOR CUFF REPAIR AND DISTAL CLAVICLE ACROMINECTOMY Left 01/27/2023   Procedure: LEFT SHOULDER ARTHROSCOPY, DEBRIDEMENT, DISTAL CLAVICLE EXCISION, MINI OPEN ROTATOR CUFF TEAR REPAIR;  Surgeon: Cammy Copa, MD;  Location: [facility] OR;  Service: Orthopedics;  Laterality: Left;   TUBAL LIGATION     Patient Active Problem List   Diagnosis Date Noted   Vertigo 02/25/2023   Complete tear of left rotator cuff 02/08/2023   Biceps tendonitis, left 02/08/2023   Arthritis of left acromioclavicular joint 02/08/2023   Memory change 12/06/2022   Excessive daytime sleepiness 12/06/2022   Prediabetes 07/29/2022   Persistent cough for 3 weeks or longer 01/21/2022   Iron deficiency anemia 11/30/2021   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) 11/27/2021   Vitamin D deficiency 11/27/2021   Chronic fatigue 11/27/2021   Paranoid delusion (HCC) 11/27/2021   GERD (gastroesophageal reflux disease) 04/25/2019   Hyperlipidemia 03/02/2019   Essential hypertension 02/18/2019   Recurrent cystitis 11/27/2012   Migraine with visual aura 09/19/2011   Chronic constipation 06/25/2011    PCP: Doreene Nest, NP  REFERRING PROVIDER: Cammy Copa, MD  REFERRING DIAG:  Diagnosis  [phone] (ICD-10-CM) - Complete tear of left rotator cuff, unspecified whether traumatic  THERAPY DIAG:  Abnormal posture  Muscle weakness (generalized)  Stiffness of left shoulder, not elsewhere classified  Chronic left shoulder pain  Rationale for Evaluation and Treatment: Rehabilitation  ONSET DATE: 01/27/2023 RTC repair  SUBJECTIVE:                                                                                                                                                                                       SUBJECTIVE STATEMENT: [name] fell on Monday December 23 and landed directly on her left shoulder.  She has been taking 2-3 prescription pain pills a day since that time.  She is having a harder time getting to sleep since her fall, but she is getting some sleep.  Hand dominance: Right  PERTINENT HISTORY: HTN, migraines, left biceps tenodesis, 01/27/2023 Lt open RTC repair, vertigo, pre-diabetes, HLD  PAIN:  Are you having pain? Yes: NPRS scale: 3-10/10 on a 10/10 this week on a 10/10 Pain location: Left shoulder Pain description: Mostly achy, can be sharp, throbbing Aggravating factors: Moving the shoulder too fast Relieving factors: Heat, movements and exercises   PRECAUTIONS: Other: RTC repair  RED FLAGS: None   WEIGHT BEARING RESTRICTIONS:  Stay off the left shoulder  FALLS:  Has patient fallen in last 6 months? No  LIVING ENVIRONMENT: Lives with: lives with their family, lives with their spouse, and lives with an adult companion Lives in: House/apartment Stairs:  No issues Has following equipment at home: None  OCCUPATION: Works in a Personal assistant, works with machines, sits and stands  PLOF:  Was struggling before surgery  PATIENT GOALS: Be able to get back to work and normal function without pain or restriction   OBJECTIVE:  Note: Objective measures were completed at Evaluation unless otherwise noted.  DIAGNOSTIC FINDINGS: Pre-surgery: IMPRESSION: 1. Full-thickness tear of the supraspinatus anteriorly in the region of the myotendinous junction. 2. Mild to moderate rotator cuff tendinosis. 3. Focal tendinosis with probable interstitial tear of the long head biceps tendon in the groove entry zone. 4. Type 2 AC joint injury.  PATIENT SURVEYS:  FOTO 32 (Goal 60 in 17 visit)  COGNITION: Overall cognitive status: Within functional limits for tasks assessed     SENSATION: No complaints of peripheral pain or  paresthesias  POSTURE: Significant for mild forward head, internally rotated and protracted shoulders  UPPER EXTREMITY ROM:   Passive ROM Left/Right 03/07/2023 Left 03/25/2023 Left 03/30/23 Left 04/01/2023 Left 04/08/2023  Shoulder flexion 95/165 130 A: 137 (supine) 135 Deferred due to pain  Shoulder extension       Shoulder abduction       Shoulder horizontal adduction    40 35  Shoulder internal rotation 30/75 50  65 65  Shoulder external rotation 25/95 55  70 50  Elbow flexion       Elbow extension       Wrist flexion       Wrist extension       Wrist ulnar deviation       Wrist radial deviation       Wrist pronation       Wrist supination       (Blank rows = not tested)  UPPER EXTREMITY STRENGTH: Deferred secondary to being less than 6 weeks post rotator cuff repair at evaluation  In pounds with hand-held dynamometer Left/Right 03/07/2023 Left/Right 04/08/2023  Shoulder flexion    Shoulder extension    Shoulder abduction    Shoulder adduction    Shoulder internal rotation  14.7/29.1  Shoulder external rotation  8.7/18.1  Middle trapezius    Lower trapezius    Elbow flexion    Elbow extension    Wrist flexion    Wrist extension    Wrist ulnar deviation    Wrist radial deviation    Wrist pronation    Wrist supination    Grip strength (lbs)    (Blank rows = not tested)    TODAY'S TREATMENT:                                                                                                                                         DATE:  04/08/2023 Supine arm raises/scapular protraction 20 x 3 seconds with 1# and 20 x 3 seconds with 2# Supine shoulder IR/ER stretch 20 x 10 seconds in each direction (abduction to 70 degrees, elbow on several pillows so elbow is slightly higher than shoulder, avoid shoulder hiking during the exercise) Supine shoulder flexion (palm faces in, protract 1st, elbow in by ear) 1 x 10 seconds, hand to hold due to pain after her fall Side-lie  shoulder ER 2 sets of 10 x, 3 second hold Shoulder blade pinches 10 x 5 seconds  Functional Activities (reaching and overhead function): Pulley flexion 2 x 10 seconds (palms in, protract 1st), limited by pain (after her fall) so held today Reviewed HEP and objective reassessment completed  Ice left shoulder 5 minutes while reviewing HEP   04/04/23:  TherEx:  UBE: level 1.0 x 3 minutes each direction Rows: green TB: eccentric control after 3 sec hold 2 x 15  ER red band walk outs: x 10  UE ranger: x 10 flexion and scaption Supine: flexion c 1 # 2 x 10  Side lying ER 2 x 10  Manual:  PROM to pt's tolerance Grade 2-3 inferior glides and GH AP mobs   04/01/2023 Supine arm raises/scapular protraction 20 x 3 seconds with 3# and 20 x 3 seconds with 4# Supine shoulder IR/ER stretch 20 x 10 seconds in each direction (abduction to 70 degrees, elbow on  several pillows so elbow is slightly higher than shoulder, avoid shoulder hiking during the exercise) Supine shoulder flexion (palm faces in, protract 1st, elbow in by ear) 10 x 10 seconds )# and 10 x 10 seconds 1# Side-lie shoulder ER 10 x 1#, 3 second hold Shoulder blade pinches 10 x 5 seconds  Functional Activities (reaching and overhead function): Pulley flexion 10 x 10 seconds (palms in, protract 1st) Reviewed and updated HEP   PATIENT EDUCATION: Education details: See above Person educated: Patient Education method: Explanation, Demonstration, Tactile cues, Verbal cues, and Handouts Education comprehension: verbalized understanding, returned demonstration, verbal cues required, tactile cues required, and needs further education  HOME EXERCISE PROGRAM: Access Code: [email or web address] URL: [email or web address] Date: 04/01/2023 Prepared by: Pauletta Browns  Exercises - Standing Scapular Retraction  - 5 x daily - 7 x weekly - 1 sets - 5 reps - 5 second hold - Supine Scapular Protraction in Flexion with Dumbbells  - 2-3 x  daily - 7 x weekly - 1 sets - 20 reps - 3 seconds hold - Supine Shoulder Internal Rotation Stretch  - 2-3 x daily - 7 x weekly - 1 sets - 10-20 reps - 10 seconds hold - Supine Shoulder External Rotation Stretch  - 2-3 x daily - 7 x weekly - 1 sets - 20 reps - 10 seconds hold - Supine Shoulder Flexion Extension Full Range AROM  - 2-3 x daily - 7 x weekly - 1 sets - 10 reps - 10 seconds hold - Sidelying Shoulder External Rotation Dumbbell  - 2 x daily - 7 x weekly - 10 reps   ASSESSMENT:  CLINICAL IMPRESSION: Orville had a set-back with her fall directly onto her surgical shoulder 4 days ago.  She is back on her prescription pain pills and objective AROM is more limited vs her last assessment.  Her strength was checked today for the first time and it is appropriate for 10 weeks post-rotator cuff repair, although there is no guarantee she did not re-tear it.  [name] and I discussed continuing her home exercises that she was able to do comfortably today (everything except overhead activities) and that we would reassess her strength next week.  If she continues to subjectively improve and her strength is objectively better next Friday, her prognosis to continue to improve remains good.  If her pain is unchanged or worse and her strength is unchanged or worse, she may need to be referred back to Dr. August Saucer sooner than anticipated.   EVAL: Patient is a 57 y.o. female who was seen today for physical therapy evaluation and treatment for  Diagnosis  M75.122 (ICD-10-CM) - Complete tear of left rotator cuff, unspecified whether traumatic  .  Zaylee will be 6 weeks post surgery on Thanksgiving day.  She has capsular tightness, pain and functional impairments consistent with being less than 6 weeks status post left rotator cuff repair.  She was set up on a comprehensive rehabilitation program with early emphasis on scapular strength, shoulder active range of motion and capsular flexibility.  We will increase appropriate  resistive strengthening by week 10 post-surgery with expectations of 90% active range of motion and 60% strength at her 12-week post-surgery anniversary.  OBJECTIVE IMPAIRMENTS: decreased activity tolerance, decreased endurance, decreased knowledge of condition, decreased ROM, decreased strength, decreased safety awareness, increased edema, impaired perceived functional ability, impaired flexibility, impaired UE functional use, postural dysfunction, and pain.   ACTIVITY LIMITATIONS: carrying, lifting, and reach over head  PARTICIPATION LIMITATIONS: community activity and occupation  PERSONAL FACTORS: HTN, migraines, left biceps tenodesis, 01/27/2023 Lt open RTC repair, vertigo, pre-diabetes, HLD are also affecting patient's functional outcome.   REHAB POTENTIAL: Good  CLINICAL DECISION MAKING: Stable/uncomplicated  EVALUATION COMPLEXITY: Low   GOALS: Goals reviewed with patient? Yes  SHORT TERM GOALS: Target date: 04/04/2023  Tonji will be independent with her day 1 HEP  Baseline: Started 03/07/2023 Goal status: Met 03/25/2023  2.  Improve left shoulder passive range of motion for flexion to 150 degrees; ER to 70 degrees; IR to 50 degrees. Baseline: 95; 25 and 30 respectively Goal status: Partially met 04/01/2023   LONG TERM GOALS: Target date: 05/02/2023  Improve FOTO to 60 in 17 visits Baseline: 32 Goal status: INITIAL  2.  Improve left shoulder pain to consistently 0-3/10 on the Numeric Pain Rating Scale Baseline: 2-7/10 Goal status: On Going 04/08/2023  3.  Improve left shoulder active range of motion to 90% of flexion 170 degrees; external rotation 90 degrees; internal rotation 70 degrees and horizontal adduction 40 degrees Baseline: See objective Goal status: On Going (<84%) 04/08/2023  4.  Improve left shoulder strength for IR and ER to 60% or better as compared to the uninvolved right Baseline: Deferred at evaluation secondary to being less than 6 weeks  postsurgery Goal status: On Going 04/08/2023  5.  Cornesha will be independent with her long-term HEP at DC Baseline: Started 03/07/2023 Goal status:  On Going 04/08/2023  PLAN:  PT FREQUENCY: 1-2x/week  PT DURATION: 2 - 4 weeks  PLANNED INTERVENTIONS: 97110-Therapeutic exercises, 97530- Therapeutic activity, 97112- Neuromuscular re-education, 97535- Self Care, 28413- Manual therapy, 97016- Vasopneumatic device, Patient/Family education, and Cryotherapy  PLAN FOR NEXT SESSION: How has her pain been over the last week since her fall on December 23.  Appropriate AROM and strength progressions.  Regeana now has completed 10 weeks post-surgery and started week 11 post-op as of 04/08/23.  NEXT MD VISIT: 04/25/23  Cherlyn Cushing, PT, MPT 04/08/23 12:21 PM   04/08/23 12:21 PM

## 2023-04-12 ENCOUNTER — Ambulatory Visit (INDEPENDENT_AMBULATORY_CARE_PROVIDER_SITE_OTHER): Payer: 59 | Admitting: Physical Therapy

## 2023-04-12 ENCOUNTER — Encounter: Payer: Self-pay | Admitting: Physical Therapy

## 2023-04-12 DIAGNOSIS — R293 Abnormal posture: Secondary | ICD-10-CM

## 2023-04-12 DIAGNOSIS — M25612 Stiffness of left shoulder, not elsewhere classified: Secondary | ICD-10-CM | POA: Diagnosis not present

## 2023-04-12 DIAGNOSIS — M6281 Muscle weakness (generalized): Secondary | ICD-10-CM | POA: Diagnosis not present

## 2023-04-12 DIAGNOSIS — M25512 Pain in left shoulder: Secondary | ICD-10-CM | POA: Diagnosis not present

## 2023-04-12 DIAGNOSIS — G8929 Other chronic pain: Secondary | ICD-10-CM

## 2023-04-12 NOTE — Therapy (Signed)
 OUTPATIENT PHYSICAL THERAPY SHOULDER TREATMENT   Patient Name: [name] MRN: [id number] DOB:11/02/1965, 57 y.o., female Today's Date: 04/12/2023  END OF SESSION:  PT End of Session - 04/12/23 1309     Visit Number 7    Number of Visits 17    Date for PT Re-Evaluation 05/02/23    Authorization Type [employer]    Progress Note Due on Visit 17    PT Start Time 1301    PT Stop Time 1340    PT Time Calculation (min) 39 min    Activity Tolerance Patient tolerated treatment well;Patient limited by pain    Behavior During Therapy [facility] for tasks assessed/performed              Past Medical History:  Diagnosis Date   Anemia    BV (bacterial vaginosis) 11/27/2012   Celiac disease    Cough due to ACE inhibitor 04/25/2019   Depression    Essential hypertension    Family history of adverse reaction to anesthesia    MOM-HARD TIME WAKING UP   GERD (gastroesophageal reflux disease)    Migraine with visual aura    MIGRAINES   UTI (lower urinary tract infection)    Past Surgical History:  Procedure Laterality Date   ABDOMINAL HYSTERECTOMY     BICEPT TENODESIS Left 01/27/2023   Procedure: BICEPS TENODESIS;  Surgeon: Addie Cordella Hamilton, MD;  Location: [facility] OR;  Service: Orthopedics;  Laterality: Left;   BLADDER SUSPENSION     COLONOSCOPY WITH PROPOFOL  N/A 05/11/2022   Procedure: COLONOSCOPY WITH PROPOFOL ;  Surgeon: Therisa Bi, MD;  Location: [facility] ENDOSCOPY;  Service: Gastroenterology;  Laterality: N/A;   ESOPHAGOGASTRODUODENOSCOPY (EGD) WITH PROPOFOL  N/A 05/11/2022   Procedure: ESOPHAGOGASTRODUODENOSCOPY (EGD) WITH PROPOFOL ;  Surgeon: Therisa Bi, MD;  Location: [facility] ENDOSCOPY;  Service: Gastroenterology;  Laterality: N/A;   EXPLORATORY LAPAROTOMY     IUD REMOVAL  07/25/2017   Procedure: INTRAUTERINE DEVICE (IUD) REMOVAL;  Surgeon: Janit Alm Agent, MD;  Location: [facility] ORS;  Service: Gynecology;;   LAPAROSCOPIC ASSISTED VAGINAL HYSTERECTOMY Bilateral 07/25/2017   Procedure:  LAPAROSCOPIC ASSISTED VAGINAL HYSTERECTOMY WITH BILATERAL SALPING OOPHERECTOMY;  Surgeon: Janit Alm Agent, MD;  Location: [facility] ORS;  Service: Gynecology;  Laterality: Bilateral;   SHOULDER ARTHROSCOPY WITH OPEN ROTATOR CUFF REPAIR AND DISTAL CLAVICLE ACROMINECTOMY Left 01/27/2023   Procedure: LEFT SHOULDER ARTHROSCOPY, DEBRIDEMENT, DISTAL CLAVICLE EXCISION, MINI OPEN ROTATOR CUFF TEAR REPAIR;  Surgeon: Addie Cordella Hamilton, MD;  Location: [facility] OR;  Service: Orthopedics;  Laterality: Left;   TUBAL LIGATION     Patient Active Problem List   Diagnosis Date Noted   Vertigo 02/25/2023   Complete tear of left rotator cuff 02/08/2023   Biceps tendonitis, left 02/08/2023   Arthritis of left acromioclavicular joint 02/08/2023   Memory change 12/06/2022   Excessive daytime sleepiness 12/06/2022   Prediabetes 07/29/2022   Persistent cough for 3 weeks or longer 01/21/2022   Iron  deficiency anemia 11/30/2021   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) 11/27/2021   Vitamin D  deficiency 11/27/2021   Chronic fatigue 11/27/2021   Paranoid delusion (HCC) 11/27/2021   GERD (gastroesophageal reflux disease) 04/25/2019   Hyperlipidemia 03/02/2019   Essential hypertension 02/18/2019   Recurrent cystitis 11/27/2012   Migraine with visual aura 09/19/2011   Chronic constipation 06/25/2011    PCP: Comer MARLA Gaskins, NP  REFERRING PROVIDER: Cordella Hamilton Addie, MD  REFERRING DIAG:  Diagnosis  [phone] (ICD-10-CM) - Complete tear of left rotator cuff, unspecified whether traumatic  THERAPY DIAG:  Abnormal posture  Muscle weakness (generalized)  Stiffness of left shoulder, not elsewhere classified  Chronic left shoulder pain  Rationale for Evaluation and Treatment: Rehabilitation  ONSET DATE: 01/27/2023 RTC repair  SUBJECTIVE:                                                                                                                                                                                       SUBJECTIVE STATEMENT:  Last Monday I fell on my left shoulder before PT, later that day I fell on my right shoulder. I tripped on the driveway this time coming back from the mailbox, twisted trying to get my other shoulder instead of the left when I fell. Have been able to ease up on pain pills again. Have been doing some of the exercises he gave me but some still hurt and are still stiff.   Hand dominance: Right  PERTINENT HISTORY: HTN, migraines, left biceps tenodesis, 01/27/2023 Lt open RTC repair, vertigo, pre-diabetes, HLD  PAIN:  Are you having pain? Yes: NPRS scale: 0/0 now 5-6/10 now/10 Pain location: Left shoulder Pain description: Mostly achy Aggravating factors: Moving the shoulder too fast Relieving factors: Heat, movements and exercises   PRECAUTIONS: Other: RTC repair  RED FLAGS: None   WEIGHT BEARING RESTRICTIONS:  Stay off the left shoulder  FALLS:  Has patient fallen in last 6 months? No  LIVING ENVIRONMENT: Lives with: lives with their family, lives with their spouse, and lives with an adult companion Lives in: House/apartment Stairs:  No issues Has following equipment at home: None  OCCUPATION: Works in a Personal assistant, works with machines, sits and stands  PLOF:  Was struggling before surgery  PATIENT GOALS: Be able to get back to work and normal function without pain or restriction   OBJECTIVE:  Note: Objective measures were completed at Evaluation unless otherwise noted.  DIAGNOSTIC FINDINGS: Pre-surgery: IMPRESSION: 1. Full-thickness tear of the supraspinatus anteriorly in the region of the myotendinous junction. 2. Mild to moderate rotator cuff tendinosis. 3. Focal tendinosis with probable interstitial tear of the long head biceps tendon in the groove entry zone. 4. Type 2 AC joint injury.  PATIENT SURVEYS:  FOTO 32 (Goal 60 in 17 visit)  COGNITION: Overall cognitive status: Within  functional limits for tasks assessed     SENSATION: No complaints of peripheral pain or paresthesias  POSTURE: Significant for mild forward head, internally rotated and protracted shoulders  UPPER EXTREMITY ROM:   Passive ROM Left/Right 03/07/2023 Left 03/25/2023 Left 03/30/23 Left 04/01/2023 Left 04/08/2023  Shoulder flexion 95/165 130 A: 137 (supine) 135 Deferred due to pain  Shoulder extension  Shoulder abduction       Shoulder horizontal adduction    40 35  Shoulder internal rotation 30/75 50  65 65  Shoulder external rotation 25/95 55  70 50  Elbow flexion       Elbow extension       Wrist flexion       Wrist extension       Wrist ulnar deviation       Wrist radial deviation       Wrist pronation       Wrist supination       (Blank rows = not tested)  UPPER EXTREMITY STRENGTH: Deferred secondary to being less than 6 weeks post rotator cuff repair at evaluation  In pounds with hand-held dynamometer Left/Right 03/07/2023 Left/Right 04/08/2023 Left/Right MMT 04/12/23  Shoulder flexion     Shoulder extension     Shoulder abduction     Shoulder adduction     Shoulder internal rotation  14.7/29.1 5/5  Shoulder external rotation  8.7/18.1 4/4  Middle trapezius     Lower trapezius     Elbow flexion     Elbow extension     Wrist flexion     Wrist extension     Wrist ulnar deviation     Wrist radial deviation     Wrist pronation     Wrist supination     Grip strength (lbs)     (Blank rows = not tested)    TODAY'S TREATMENT:                                                                                                                                         DATE:    04/12/23  UBE L1 all four extremities x3 min forward/x3 min backward  MMT check Side lying ER 0# 15x3 second holds LUE, then x10 with 2#  Supine flexion work with 1# down AAROM x10 still painful  Supine protraction 2# 2x15 L UE Shoulder blade pinches 10x5  UE reacher flexion x5 pain  limited UE reacher scaption x5 pain limited Bicep curls 2# x10 slow lower  Thoracic extensions x12        04/08/2023 Supine arm raises/scapular protraction 20 x 3 seconds with 1# and 20 x 3 seconds with 2# Supine shoulder IR/ER stretch 20 x 10 seconds in each direction (abduction to 70 degrees, elbow on several pillows so elbow is slightly higher than shoulder, avoid shoulder hiking during the exercise) Supine shoulder flexion (palm faces in, protract 1st, elbow in by ear) 1 x 10 seconds, hand to hold due to pain after her fall Side-lie shoulder ER 2 sets of 10 x, 3 second hold Shoulder blade pinches 10 x 5 seconds  Functional Activities (reaching and overhead function): Pulley flexion 2 x 10 seconds (palms in, protract 1st), limited by pain (after her fall) so held today Reviewed HEP and objective reassessment completed  Ice left shoulder 5 minutes while reviewing HEP   04/04/23:  TherEx:  UBE: level 1.0 x 3 minutes each direction Rows: green TB: eccentric control after 3 sec hold 2 x 15  ER red band walk outs: x 10  UE ranger: x 10 flexion and scaption Supine: flexion c 1 # 2 x 10  Side lying ER 2 x 10  Manual:  PROM to pt's tolerance Grade 2-3 inferior glides and GH AP mobs   04/01/2023 Supine arm raises/scapular protraction 20 x 3 seconds with 3# and 20 x 3 seconds with 4# Supine shoulder IR/ER stretch 20 x 10 seconds in each direction (abduction to 70 degrees, elbow on several pillows so elbow is slightly higher than shoulder, avoid shoulder hiking during the exercise) Supine shoulder flexion (palm faces in, protract 1st, elbow in by ear) 10 x 10 seconds )# and 10 x 10 seconds 1# Side-lie shoulder ER 10 x 1#, 3 second hold Shoulder blade pinches 10 x 5 seconds  Functional Activities (reaching and overhead function): Pulley flexion 10 x 10 seconds (palms in, protract 1st) Reviewed and updated HEP   PATIENT EDUCATION: Education details: See above Person educated:  Patient Education method: Explanation, Demonstration, Tactile cues, Verbal cues, and Handouts Education comprehension: verbalized understanding, returned demonstration, verbal cues required, tactile cues required, and needs further education  HOME EXERCISE PROGRAM: Access Code: [email or web address] URL: [email or web address] Date: 04/01/2023 Prepared by: Lamar Ivory  Exercises - Standing Scapular Retraction  - 5 x daily - 7 x weekly - 1 sets - 5 reps - 5 second hold - Supine Scapular Protraction in Flexion with Dumbbells  - 2-3 x daily - 7 x weekly - 1 sets - 20 reps - 3 seconds hold - Supine Shoulder Internal Rotation Stretch  - 2-3 x daily - 7 x weekly - 1 sets - 10-20 reps - 10 seconds hold - Supine Shoulder External Rotation Stretch  - 2-3 x daily - 7 x weekly - 1 sets - 20 reps - 10 seconds hold - Supine Shoulder Flexion Extension Full Range AROM  - 2-3 x daily - 7 x weekly - 1 sets - 10 reps - 10 seconds hold - Sidelying Shoulder External Rotation Dumbbell  - 2 x daily - 7 x weekly - 10 reps   ASSESSMENT:  CLINICAL IMPRESSION:  Pt arrives today doing OK, she had a fall onto her R shoulder (thankfully not the surgical side) tripping on a root in the driveway. We tried warmup on UBE today to help progress UE strength being fairly far out from surgery at this point, able to tolerate with all four extremities, otherwise kept working on ROM and strength as appropriate. Checked rotator cuff strength today, will compare again on Friday to further assess if her L rotator cuff was damaged in her first fall. Able to complete resisted ER/IR work without increased pain which is promising but flexion and scaption remain painful. Will continue to monitor.    EVAL: Patient is a 57 y.o. female who was seen today for physical therapy evaluation and treatment for  Diagnosis  M75.122 (ICD-10-CM) - Complete tear of left rotator cuff, unspecified whether traumatic  .  [name] will be 6 weeks post  surgery on Thanksgiving day.  She has capsular tightness, pain and functional impairments consistent with being less than 6 weeks status post left rotator cuff repair.  She was set up on a comprehensive rehabilitation program with early emphasis on scapular strength, shoulder active range of motion and  capsular flexibility.  We will increase appropriate resistive strengthening by week 10 post-surgery with expectations of 90% active range of motion and 60% strength at her 12-week post-surgery anniversary.  OBJECTIVE IMPAIRMENTS: decreased activity tolerance, decreased endurance, decreased knowledge of condition, decreased ROM, decreased strength, decreased safety awareness, increased edema, impaired perceived functional ability, impaired flexibility, impaired UE functional use, postural dysfunction, and pain.   ACTIVITY LIMITATIONS: carrying, lifting, and reach over head  PARTICIPATION LIMITATIONS: community activity and occupation  PERSONAL FACTORS: HTN, migraines, left biceps tenodesis, 01/27/2023 Lt open RTC repair, vertigo, pre-diabetes, HLD are also affecting patient's functional outcome.   REHAB POTENTIAL: Good  CLINICAL DECISION MAKING: Stable/uncomplicated  EVALUATION COMPLEXITY: Low   GOALS: Goals reviewed with patient? Yes  SHORT TERM GOALS: Target date: 04/04/2023  [name] will be independent with her day 1 HEP  Baseline: Started 03/07/2023 Goal status: Met 03/25/2023  2.  Improve left shoulder passive range of motion for flexion to 150 degrees; ER to 70 degrees; IR to 50 degrees. Baseline: 95; 25 and 30 respectively Goal status: Partially met 04/01/2023   LONG TERM GOALS: Target date: 05/02/2023  Improve FOTO to 60 in 17 visits Baseline: 32 Goal status: INITIAL  2.  Improve left shoulder pain to consistently 0-3/10 on the Numeric Pain Rating Scale Baseline: 2-7/10 Goal status: On Going 04/08/2023  3.  Improve left shoulder active range of motion to 90% of flexion 170  degrees; external rotation 90 degrees; internal rotation 70 degrees and horizontal adduction 40 degrees Baseline: See objective Goal status: On Going (<84%) 04/08/2023  4.  Improve left shoulder strength for IR and ER to 60% or better as compared to the uninvolved right Baseline: Deferred at evaluation secondary to being less than 6 weeks postsurgery Goal status: On Going 04/08/2023  5.  Kinsley will be independent with her long-term HEP at DC Baseline: Started 03/07/2023 Goal status:  On Going 04/08/2023  PLAN:  PT FREQUENCY: 1-2x/week  PT DURATION: 2 - 4 weeks  PLANNED INTERVENTIONS: 97110-Therapeutic exercises, 97530- Therapeutic activity, 97112- Neuromuscular re-education, 97535- Self Care, [id number]- Manual therapy, [id number]- Vasopneumatic device, Patient/Family education, and Cryotherapy  PLAN FOR NEXT SESSION: recheck rotator cuff strength again Friday.   Appropriate AROM and strength progressions. 11 weeks post-op as of 04/08/23.  NEXT MD VISIT: 04/25/23  [name], PT, DPT 04/12/23 1:40 PM    04/12/23 1:40 PM

## 2023-04-15 ENCOUNTER — Encounter: Payer: Self-pay | Admitting: Physical Therapy

## 2023-04-15 ENCOUNTER — Ambulatory Visit: Payer: 59 | Admitting: Physical Therapy

## 2023-04-15 DIAGNOSIS — M25512 Pain in left shoulder: Secondary | ICD-10-CM | POA: Diagnosis not present

## 2023-04-15 DIAGNOSIS — R293 Abnormal posture: Secondary | ICD-10-CM | POA: Diagnosis not present

## 2023-04-15 DIAGNOSIS — M25612 Stiffness of left shoulder, not elsewhere classified: Secondary | ICD-10-CM

## 2023-04-15 DIAGNOSIS — M6281 Muscle weakness (generalized): Secondary | ICD-10-CM

## 2023-04-15 DIAGNOSIS — G8929 Other chronic pain: Secondary | ICD-10-CM

## 2023-04-15 NOTE — Therapy (Signed)
 OUTPATIENT PHYSICAL THERAPY SHOULDER TREATMENT   Patient Name: [name] MRN: [id number] DOB:Sep 09, 1965, 58 y.o., female Today's Date: 04/15/2023  END OF SESSION:  PT End of Session - 04/15/23 1309     Visit Number 8    Number of Visits 17    Date for PT Re-Evaluation 05/02/23    Authorization Type [employer]    Progress Note Due on Visit 17    PT Start Time 1303    PT Stop Time 1341    PT Time Calculation (min) 38 min    Activity Tolerance Patient tolerated treatment well    Behavior During Therapy WFL for tasks assessed/performed               Past Medical History:  Diagnosis Date   Anemia    BV (bacterial vaginosis) 11/27/2012   Celiac disease    Cough due to ACE inhibitor 04/25/2019   Depression    Essential hypertension    Family history of adverse reaction to anesthesia    MOM-HARD TIME WAKING UP   GERD (gastroesophageal reflux disease)    Migraine with visual aura    MIGRAINES   UTI (lower urinary tract infection)    Past Surgical History:  Procedure Laterality Date   ABDOMINAL HYSTERECTOMY     BICEPT TENODESIS Left 01/27/2023   Procedure: BICEPS TENODESIS;  Surgeon: Addie Cordella Hamilton, MD;  Location: [facility] OR;  Service: Orthopedics;  Laterality: Left;   BLADDER SUSPENSION     COLONOSCOPY WITH PROPOFOL  N/A 05/11/2022   Procedure: COLONOSCOPY WITH PROPOFOL ;  Surgeon: Therisa Bi, MD;  Location: [facility] ENDOSCOPY;  Service: Gastroenterology;  Laterality: N/A;   ESOPHAGOGASTRODUODENOSCOPY (EGD) WITH PROPOFOL  N/A 05/11/2022   Procedure: ESOPHAGOGASTRODUODENOSCOPY (EGD) WITH PROPOFOL ;  Surgeon: Therisa Bi, MD;  Location: [facility] ENDOSCOPY;  Service: Gastroenterology;  Laterality: N/A;   EXPLORATORY LAPAROTOMY     IUD REMOVAL  07/25/2017   Procedure: INTRAUTERINE DEVICE (IUD) REMOVAL;  Surgeon: Janit Alm Agent, MD;  Location: [facility] ORS;  Service: Gynecology;;   LAPAROSCOPIC ASSISTED VAGINAL HYSTERECTOMY Bilateral 07/25/2017   Procedure: LAPAROSCOPIC ASSISTED VAGINAL  HYSTERECTOMY WITH BILATERAL SALPING OOPHERECTOMY;  Surgeon: Janit Alm Agent, MD;  Location: [facility] ORS;  Service: Gynecology;  Laterality: Bilateral;   SHOULDER ARTHROSCOPY WITH OPEN ROTATOR CUFF REPAIR AND DISTAL CLAVICLE ACROMINECTOMY Left 01/27/2023   Procedure: LEFT SHOULDER ARTHROSCOPY, DEBRIDEMENT, DISTAL CLAVICLE EXCISION, MINI OPEN ROTATOR CUFF TEAR REPAIR;  Surgeon: Addie Cordella Hamilton, MD;  Location: [facility] OR;  Service: Orthopedics;  Laterality: Left;   TUBAL LIGATION     Patient Active Problem List   Diagnosis Date Noted   Vertigo 02/25/2023   Complete tear of left rotator cuff 02/08/2023   Biceps tendonitis, left 02/08/2023   Arthritis of left acromioclavicular joint 02/08/2023   Memory change 12/06/2022   Excessive daytime sleepiness 12/06/2022   Prediabetes 07/29/2022   Persistent cough for 3 weeks or longer 01/21/2022   Iron  deficiency anemia 11/30/2021   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) 11/27/2021   Vitamin D  deficiency 11/27/2021   Chronic fatigue 11/27/2021   Paranoid delusion (HCC) 11/27/2021   GERD (gastroesophageal reflux disease) 04/25/2019   Hyperlipidemia 03/02/2019   Essential hypertension 02/18/2019   Recurrent cystitis 11/27/2012   Migraine with visual aura 09/19/2011   Chronic constipation 06/25/2011    PCP: Comer MARLA Gaskins, NP  REFERRING PROVIDER: Cordella Hamilton Addie, MD  REFERRING DIAG:  Diagnosis  [phone] (ICD-10-CM) - Complete tear of left rotator cuff, unspecified whether traumatic  THERAPY DIAG:  Abnormal posture  Muscle weakness (generalized)  Stiffness of left shoulder, not elsewhere classified  Chronic left shoulder pain  Rationale for Evaluation and Treatment: Rehabilitation  ONSET DATE: 01/27/2023 RTC repair  SUBJECTIVE:                                                                                                                                                                                       SUBJECTIVE STATEMENT:  No falls since last time, left shoulder is OK but has been better, right is feeling OK I think it was just bruised. I see the doctor soon, wondering if he will be able to send me back to work on light duty   Hand dominance: Right  PERTINENT HISTORY: HTN, migraines, left biceps tenodesis, 01/27/2023 Lt open RTC repair, vertigo, pre-diabetes, HLD  PAIN:  Are you having pain? No 0/10 now   PRECAUTIONS: Other: RTC repair  RED FLAGS: None   WEIGHT BEARING RESTRICTIONS:  Stay off the left shoulder  FALLS:  Has patient fallen in last 6 months? No  LIVING ENVIRONMENT: Lives with: lives with their family, lives with their spouse, and lives with an adult companion Lives in: House/apartment Stairs:  No issues Has following equipment at home: None  OCCUPATION: Works in a Personal assistant, works with machines, sits and stands  PLOF:  Was struggling before surgery  PATIENT GOALS: Be able to get back to work and normal function without pain or restriction   OBJECTIVE:  Note: Objective measures were completed at Evaluation unless otherwise noted.  DIAGNOSTIC FINDINGS: Pre-surgery: IMPRESSION: 1. Full-thickness tear of the supraspinatus anteriorly in the region of the myotendinous junction. 2. Mild to moderate rotator cuff tendinosis. 3. Focal tendinosis with probable interstitial tear of the long head biceps tendon in the groove entry zone. 4. Type 2 AC joint injury.  PATIENT SURVEYS:  FOTO 32 (Goal 60 in 17 visit)  COGNITION: Overall cognitive status: Within functional limits for tasks assessed     SENSATION: No complaints of peripheral pain or paresthesias  POSTURE: Significant for mild forward head, internally rotated and protracted shoulders  UPPER EXTREMITY ROM:   Passive ROM Left/Right 03/07/2023 Left 03/25/2023 Left 03/30/23 Left 04/01/2023 Left 04/08/2023  Shoulder flexion 95/165 130 A: 137 (supine) 135 Deferred due  to pain  Shoulder extension       Shoulder abduction       Shoulder horizontal adduction    40 35  Shoulder internal rotation 30/75 50  65 65  Shoulder external rotation 25/95 55  70 50  Elbow flexion       Elbow extension       Wrist  flexion       Wrist extension       Wrist ulnar deviation       Wrist radial deviation       Wrist pronation       Wrist supination       (Blank rows = not tested)  UPPER EXTREMITY STRENGTH: Deferred secondary to being less than 6 weeks post rotator cuff repair at evaluation  In pounds with hand-held dynamometer Left/Right 03/07/2023 Left/Right 04/08/2023 Left/Right MMT 04/12/23 Left/Right MMT 04/15/23  Shoulder flexion      Shoulder extension      Shoulder abduction      Shoulder adduction      Shoulder internal rotation  14.7/29.1 5/5   Shoulder external rotation  8.7/18.1 4/4   Middle trapezius      Lower trapezius      Elbow flexion      Elbow extension      Wrist flexion      Wrist extension      Wrist ulnar deviation      Wrist radial deviation      Wrist pronation      Wrist supination      Grip strength (lbs)      (Blank rows = not tested)    TODAY'S TREATMENT:                                                                                                                                         DATE:    04/15/23  UBE L1 x4 min forward/x4 min backward all four extremities MMT check- see above 12x5 second holds L UE flexion 12x5 second holds L UE scaption 12x5 second holds L shoulder ER towel pinched to ribs with elbow  12x5 second holds L shoulder sleeper stretch sidelying  Supine shoulder flexion 2# 1x12 L UE  Attempted supine shoulder ABD- painful even without weight  Sidelying ER 2# x12  Standing shoulder ABD isometrics L 25% effort 12x3 second holds, no pain      04/12/23  UBE L1 all four extremities x3 min forward/x3 min backward  MMT check Side lying ER 0# 15x3 second holds LUE, then x10 with 2#  Supine  flexion work with 1# down AAROM x10 still painful  Supine protraction 2# 2x15 L UE Shoulder blade pinches 10x5  UE reacher flexion x5 pain limited UE reacher scaption x5 pain limited Bicep curls 2# x10 slow lower  Thoracic extensions x12        04/08/2023 Supine arm raises/scapular protraction 20 x 3 seconds with 1# and 20 x 3 seconds with 2# Supine shoulder IR/ER stretch 20 x 10 seconds in each direction (abduction to 70 degrees, elbow on several pillows so elbow is slightly higher than shoulder, avoid shoulder hiking during the exercise) Supine shoulder flexion (palm faces in, protract 1st, elbow in by ear) 1 x 10 seconds, hand to  hold due to pain after her fall Side-lie shoulder ER 2 sets of 10 x, 3 second hold Shoulder blade pinches 10 x 5 seconds  Functional Activities (reaching and overhead function): Pulley flexion 2 x 10 seconds (palms in, protract 1st), limited by pain (after her fall) so held today Reviewed HEP and objective reassessment completed  Ice left shoulder 5 minutes while reviewing HEP   04/04/23:  TherEx:  UBE: level 1.0 x 3 minutes each direction Rows: green TB: eccentric control after 3 sec hold 2 x 15  ER red band walk outs: x 10  UE ranger: x 10 flexion and scaption Supine: flexion c 1 # 2 x 10  Side lying ER 2 x 10  Manual:  PROM to pt's tolerance Grade 2-3 inferior glides and GH AP mobs   04/01/2023 Supine arm raises/scapular protraction 20 x 3 seconds with 3# and 20 x 3 seconds with 4# Supine shoulder IR/ER stretch 20 x 10 seconds in each direction (abduction to 70 degrees, elbow on several pillows so elbow is slightly higher than shoulder, avoid shoulder hiking during the exercise) Supine shoulder flexion (palm faces in, protract 1st, elbow in by ear) 10 x 10 seconds )# and 10 x 10 seconds 1# Side-lie shoulder ER 10 x 1#, 3 second hold Shoulder blade pinches 10 x 5 seconds  Functional Activities (reaching and overhead function): Pulley  flexion 10 x 10 seconds (palms in, protract 1st) Reviewed and updated HEP   PATIENT EDUCATION: Education details: See above Person educated: Patient Education method: Explanation, Demonstration, Tactile cues, Verbal cues, and Handouts Education comprehension: verbalized understanding, returned demonstration, verbal cues required, tactile cues required, and needs further education  HOME EXERCISE PROGRAM: Access Code: [email or web address] URL: [email or web address] Date: 04/01/2023 Prepared by: Lamar Ivory  Exercises - Standing Scapular Retraction  - 5 x daily - 7 x weekly - 1 sets - 5 reps - 5 second hold - Supine Scapular Protraction in Flexion with Dumbbells  - 2-3 x daily - 7 x weekly - 1 sets - 20 reps - 3 seconds hold - Supine Shoulder Internal Rotation Stretch  - 2-3 x daily - 7 x weekly - 1 sets - 10-20 reps - 10 seconds hold - Supine Shoulder External Rotation Stretch  - 2-3 x daily - 7 x weekly - 1 sets - 20 reps - 10 seconds hold - Supine Shoulder Flexion Extension Full Range AROM  - 2-3 x daily - 7 x weekly - 1 sets - 10 reps - 10 seconds hold - Sidelying Shoulder External Rotation Dumbbell  - 2 x daily - 7 x weekly - 10 reps   ASSESSMENT:  CLINICAL IMPRESSION:   Pt arrives today feeling quite a bit better, no increased pain. MMT is holding steady, at this point I have minimal concern for a new tear. Continued working on ROM and strength as tolerated, she is fairly far behind expected rehab progression being 11 weeks post op as of yesterday. Encouraged in depth conversation about potential restrictions for return to work- she does have to do a lot of repetitive motions in her job and I am concerned about aggravating shoulder pain given limited ROM and strength that we are still trying to work through. She sees the surgeon for f/u visit on 04/25/23, will need progress note next visit.    EVAL: Patient is a 58 y.o. female who was seen today for physical therapy evaluation  and treatment for  Diagnosis  M75.122 (ICD-10-CM) - Complete tear of left  rotator cuff, unspecified whether traumatic  .  [name] will be 6 weeks post surgery on Thanksgiving day.  She has capsular tightness, pain and functional impairments consistent with being less than 6 weeks status post left rotator cuff repair.  She was set up on a comprehensive rehabilitation program with early emphasis on scapular strength, shoulder active range of motion and capsular flexibility.  We will increase appropriate resistive strengthening by week 10 post-surgery with expectations of 90% active range of motion and 60% strength at her 12-week post-surgery anniversary.  OBJECTIVE IMPAIRMENTS: decreased activity tolerance, decreased endurance, decreased knowledge of condition, decreased ROM, decreased strength, decreased safety awareness, increased edema, impaired perceived functional ability, impaired flexibility, impaired UE functional use, postural dysfunction, and pain.   ACTIVITY LIMITATIONS: carrying, lifting, and reach over head  PARTICIPATION LIMITATIONS: community activity and occupation  PERSONAL FACTORS: HTN, migraines, left biceps tenodesis, 01/27/2023 Lt open RTC repair, vertigo, pre-diabetes, HLD are also affecting patient's functional outcome.   REHAB POTENTIAL: Good  CLINICAL DECISION MAKING: Stable/uncomplicated  EVALUATION COMPLEXITY: Low   GOALS: Goals reviewed with patient? Yes  SHORT TERM GOALS: Target date: 04/04/2023  [name] will be independent with her day 1 HEP  Baseline: Started 03/07/2023 Goal status: Met 03/25/2023  2.  Improve left shoulder passive range of motion for flexion to 150 degrees; ER to 70 degrees; IR to 50 degrees. Baseline: 95; 25 and 30 respectively Goal status: Partially met 04/01/2023   LONG TERM GOALS: Target date: 05/02/2023  Improve FOTO to 60 in 17 visits Baseline: 32 Goal status: INITIAL  2.  Improve left shoulder pain to consistently 0-3/10 on the  Numeric Pain Rating Scale Baseline: 2-7/10 Goal status: On Going 04/08/2023  3.  Improve left shoulder active range of motion to 90% of flexion 170 degrees; external rotation 90 degrees; internal rotation 70 degrees and horizontal adduction 40 degrees Baseline: See objective Goal status: On Going (<84%) 04/08/2023  4.  Improve left shoulder strength for IR and ER to 60% or better as compared to the uninvolved right Baseline: Deferred at evaluation secondary to being less than 6 weeks postsurgery Goal status: On Going 04/08/2023  5.  [name] will be independent with her long-term HEP at DC Baseline: Started 03/07/2023 Goal status:  On Going 04/08/2023  PLAN:  PT FREQUENCY: 1-2x/week  PT DURATION: 2 - 4 weeks  PLANNED INTERVENTIONS: 97110-Therapeutic exercises, 97530- Therapeutic activity, 97112- Neuromuscular re-education, 97535- Self Care, [id number]- Manual therapy, [id number]- Vasopneumatic device, Patient/Family education, and Cryotherapy  PLAN FOR NEXT SESSION:  Progress note prior to MD appt, likely recent/will likely need more visits. Appropriate AROM and strength progressions. 11 weeks post-op as of 04/14/23  NEXT MD VISIT: 04/25/23  Josette Rough, PT, DPT 04/15/23 1:42 PM    04/15/23 1:42 PM

## 2023-04-19 ENCOUNTER — Ambulatory Visit (INDEPENDENT_AMBULATORY_CARE_PROVIDER_SITE_OTHER): Payer: 59 | Admitting: Physical Therapy

## 2023-04-19 ENCOUNTER — Encounter: Payer: Self-pay | Admitting: Physical Therapy

## 2023-04-19 DIAGNOSIS — M6281 Muscle weakness (generalized): Secondary | ICD-10-CM | POA: Diagnosis not present

## 2023-04-19 DIAGNOSIS — M25612 Stiffness of left shoulder, not elsewhere classified: Secondary | ICD-10-CM | POA: Diagnosis not present

## 2023-04-19 DIAGNOSIS — M25512 Pain in left shoulder: Secondary | ICD-10-CM

## 2023-04-19 DIAGNOSIS — G8929 Other chronic pain: Secondary | ICD-10-CM

## 2023-04-19 DIAGNOSIS — R293 Abnormal posture: Secondary | ICD-10-CM | POA: Diagnosis not present

## 2023-04-19 NOTE — Therapy (Signed)
 OUTPATIENT PHYSICAL THERAPY SHOULDER TREATMENT   Patient Name: London Nonaka MRN: [id number] DOB:06-03-1965, 58 y.o., female Today's Date: 04/19/2023  END OF SESSION:  PT End of Session - 04/19/23 1024     Visit Number 9    Number of Visits 17    Date for PT Re-Evaluation 05/02/23    Authorization Type [employer]    Progress Note Due on Visit 17    PT Start Time 1015    PT Stop Time 1054    PT Time Calculation (min) 39 min    Activity Tolerance Patient tolerated treatment well    Behavior During Therapy WFL for tasks assessed/performed                Past Medical History:  Diagnosis Date   Anemia    BV (bacterial vaginosis) 11/27/2012   Celiac disease    Cough due to ACE inhibitor 04/25/2019   Depression    Essential hypertension    Family history of adverse reaction to anesthesia    MOM-HARD TIME WAKING UP   GERD (gastroesophageal reflux disease)    Migraine with visual aura    MIGRAINES   UTI (lower urinary tract infection)    Past Surgical History:  Procedure Laterality Date   ABDOMINAL HYSTERECTOMY     BICEPT TENODESIS Left 01/27/2023   Procedure: BICEPS TENODESIS;  Surgeon: Addie Cordella Hamilton, MD;  Location: [facility] OR;  Service: Orthopedics;  Laterality: Left;   BLADDER SUSPENSION     COLONOSCOPY WITH PROPOFOL  N/A 05/11/2022   Procedure: COLONOSCOPY WITH PROPOFOL ;  Surgeon: Therisa Bi, MD;  Location: [facility] ENDOSCOPY;  Service: Gastroenterology;  Laterality: N/A;   ESOPHAGOGASTRODUODENOSCOPY (EGD) WITH PROPOFOL  N/A 05/11/2022   Procedure: ESOPHAGOGASTRODUODENOSCOPY (EGD) WITH PROPOFOL ;  Surgeon: Therisa Bi, MD;  Location: [facility] ENDOSCOPY;  Service: Gastroenterology;  Laterality: N/A;   EXPLORATORY LAPAROTOMY     IUD REMOVAL  07/25/2017   Procedure: INTRAUTERINE DEVICE (IUD) REMOVAL;  Surgeon: Janit Alm Agent, MD;  Location: [facility] ORS;  Service: Gynecology;;   LAPAROSCOPIC ASSISTED VAGINAL HYSTERECTOMY Bilateral 07/25/2017   Procedure: LAPAROSCOPIC ASSISTED VAGINAL  HYSTERECTOMY WITH BILATERAL SALPING OOPHERECTOMY;  Surgeon: Janit Alm Agent, MD;  Location: [facility] ORS;  Service: Gynecology;  Laterality: Bilateral;   SHOULDER ARTHROSCOPY WITH OPEN ROTATOR CUFF REPAIR AND DISTAL CLAVICLE ACROMINECTOMY Left 01/27/2023   Procedure: LEFT SHOULDER ARTHROSCOPY, DEBRIDEMENT, DISTAL CLAVICLE EXCISION, MINI OPEN ROTATOR CUFF TEAR REPAIR;  Surgeon: Addie Cordella Hamilton, MD;  Location: [facility] OR;  Service: Orthopedics;  Laterality: Left;   TUBAL LIGATION     Patient Active Problem List   Diagnosis Date Noted   Vertigo 02/25/2023   Complete tear of left rotator cuff 02/08/2023   Biceps tendonitis, left 02/08/2023   Arthritis of left acromioclavicular joint 02/08/2023   Memory change 12/06/2022   Excessive daytime sleepiness 12/06/2022   Prediabetes 07/29/2022   Persistent cough for 3 weeks or longer 01/21/2022   Iron  deficiency anemia 11/30/2021   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) 11/27/2021   Vitamin D  deficiency 11/27/2021   Chronic fatigue 11/27/2021   Paranoid delusion (HCC) 11/27/2021   GERD (gastroesophageal reflux disease) 04/25/2019   Hyperlipidemia 03/02/2019   Essential hypertension 02/18/2019   Recurrent cystitis 11/27/2012   Migraine with visual aura 09/19/2011   Chronic constipation 06/25/2011    PCP: Comer MARLA Gaskins, NP  REFERRING PROVIDER: Cordella Hamilton Addie, MD  REFERRING DIAG:  Diagnosis  [phone] (ICD-10-CM) - Complete tear of left rotator cuff, unspecified whether traumatic  THERAPY DIAG:  Abnormal posture  Muscle weakness (generalized)  Stiffness of left shoulder, not elsewhere classified  Chronic left shoulder pain  Rationale for Evaluation and Treatment: Rehabilitation  ONSET DATE: 01/27/2023 RTC repair  SUBJECTIVE:                                                                                                                                                                                       SUBJECTIVE STATEMENT: Shoulder is sore, but not painful  Hand dominance: Right  PERTINENT HISTORY: HTN, migraines, left biceps tenodesis, 01/27/2023 Lt open RTC repair, vertigo, pre-diabetes, HLD  PAIN:  Are you having pain? No 0/10 now   PRECAUTIONS: Other: RTC repair  RED FLAGS: None   WEIGHT BEARING RESTRICTIONS:  Stay off the left shoulder  FALLS:  Has patient fallen in last 6 months? No  LIVING ENVIRONMENT: Lives with: lives with their family, lives with their spouse, and lives with an adult companion Lives in: House/apartment Stairs:  No issues Has following equipment at home: None  OCCUPATION: Works in a Personal assistant, works with machines, sits and stands  PLOF:  Was struggling before surgery  PATIENT GOALS: Be able to get back to work and normal function without pain or restriction   OBJECTIVE:  Note: Objective measures were completed at Evaluation unless otherwise noted.  DIAGNOSTIC FINDINGS: Pre-surgery: IMPRESSION: 1. Full-thickness tear of the supraspinatus anteriorly in the region of the myotendinous junction. 2. Mild to moderate rotator cuff tendinosis. 3. Focal tendinosis with probable interstitial tear of the long head biceps tendon in the groove entry zone. 4. Type 2 AC joint injury.  PATIENT SURVEYS:  FOTO 32 (Goal 60 in 17 visit)  COGNITION: Overall cognitive status: Within functional limits for tasks assessed     SENSATION: No complaints of peripheral pain or paresthesias  POSTURE: Significant for mild forward head, internally rotated and protracted shoulders  UPPER EXTREMITY ROM:   Passive ROM Left/Right 03/07/2023 Left 03/25/2023 Left 03/30/23 Left 04/01/2023 Left 04/08/2023 Left  04/19/23  Shoulder flexion 95/165 130 A: 137 (supine) 135 Deferred due to pain A: 117  (sitting)  Shoulder extension        Shoulder abduction      80  (sitting)  Shoulder horizontal adduction    40 35   Shoulder internal  rotation 30/75 50  65 65   Shoulder external rotation 25/95 55  70 50 65  (sitting)  (Blank rows = not tested)  UPPER EXTREMITY STRENGTH: Deferred secondary to being less than 6 weeks post rotator cuff repair at evaluation  In pounds with hand-held dynamometer Left/Right 03/07/2023 Left/Right 04/08/2023 Left/Right MMT 04/12/23 Left/Right MMT 04/15/23  Shoulder flexion      Shoulder extension      Shoulder abduction      Shoulder adduction      Shoulder internal rotation  14.7/29.1 5/5   Shoulder external rotation  8.7/18.1 4/4   (Blank rows = not tested)    TODAY'S TREATMENT:                                                                                                                                         DATE:  04/19/23 TherEx UBE L2 x 8 min (4 min each direction) Standing AA flexion 2x10; 1# bar; cues to decrease shoulder shrug Standing AA abduction 2x10; 1# bar and cues for shrug Standing wall ladder with 1# wrist weight flexion and scaption x 10 reps each Rows L4 band 2x10; 5 sec hold IR/ER reactive isometrics L2 band x10 reps each Supine shoulder flexion with 5 sec hold at end range for stretch; 2# 2x10 Supine 90 deg flexion circles CW/CCW x 20 each; 2# on Lt Sidelying Lt ER 2x10; 2# ROM measurements - see above for details  04/15/23 UBE L1 x4 min forward/x4 min backward all four extremities MMT check- see above 12x5 second holds L UE flexion 12x5 second holds L UE scaption 12x5 second holds L shoulder ER towel pinched to ribs with elbow  12x5 second holds L shoulder sleeper stretch sidelying  Supine shoulder flexion 2# 1x12 L UE  Attempted supine shoulder ABD- painful even without weight  Sidelying ER 2# x12  Standing shoulder ABD isometrics L 25% effort 12x3 second holds, no pain      04/12/23 UBE L1 all four extremities x3 min forward/x3 min backward  MMT check Side lying ER 0# 15x3 second holds LUE, then x10 with 2#  Supine flexion work with 1# down AAROM  x10 still painful  Supine protraction 2# 2x15 L UE Shoulder blade pinches 10x5  UE reacher flexion x5 pain limited UE reacher scaption x5 pain limited Bicep curls 2# x10 slow lower  Thoracic extensions x12     04/08/2023 Supine arm raises/scapular protraction 20 x 3 seconds with 1# and 20 x 3 seconds with 2# Supine shoulder IR/ER stretch 20 x 10 seconds in each direction (abduction to 70 degrees, elbow on several pillows so elbow is slightly higher than shoulder, avoid shoulder hiking during the exercise) Supine shoulder flexion (palm faces in, protract 1st, elbow in by ear) 1 x 10 seconds, hand to hold due to pain after her fall Side-lie shoulder ER 2 sets of 10 x, 3 second hold Shoulder blade pinches 10 x 5 seconds  Functional Activities (reaching and overhead function): Pulley flexion 2 x 10 seconds (palms in, protract 1st), limited by pain (after her fall) so held today Reviewed HEP and objective reassessment completed  Ice left shoulder 5 minutes while reviewing HEP   PATIENT EDUCATION: Education details: See above Person educated:  Patient Education method: Explanation, Demonstration, Tactile cues, Verbal cues, and Handouts Education comprehension: verbalized understanding, returned demonstration, verbal cues required, tactile cues required, and needs further education  HOME EXERCISE PROGRAM: Access Code: [email or web address] URL: [email or web address] Date: 04/01/2023 Prepared by: Lamar Ivory  Exercises - Standing Scapular Retraction  - 5 x daily - 7 x weekly - 1 sets - 5 reps - 5 second hold - Supine Scapular Protraction in Flexion with Dumbbells  - 2-3 x daily - 7 x weekly - 1 sets - 20 reps - 3 seconds hold - Supine Shoulder Internal Rotation Stretch  - 2-3 x daily - 7 x weekly - 1 sets - 10-20 reps - 10 seconds hold - Supine Shoulder External Rotation Stretch  - 2-3 x daily - 7 x weekly - 1 sets - 20 reps - 10 seconds hold - Supine Shoulder Flexion Extension  Full Range AROM  - 2-3 x daily - 7 x weekly - 1 sets - 10 reps - 10 seconds hold - Sidelying Shoulder External Rotation Dumbbell  - 2 x daily - 7 x weekly - 10 reps   ASSESSMENT:  CLINICAL IMPRESSION: Pt tolerated session well today, still with limited ROM in sitting at this time and strength continues to be limited.  Will continue to benefit from PT to maximize function.   EVAL: Patient is a 58 y.o. female who was seen today for physical therapy evaluation and treatment for  Diagnosis  M75.122 (ICD-10-CM) - Complete tear of left rotator cuff, unspecified whether traumatic  .  Gizel will be 6 weeks post surgery on Thanksgiving day.  She has capsular tightness, pain and functional impairments consistent with being less than 6 weeks status post left rotator cuff repair.  She was set up on a comprehensive rehabilitation program with early emphasis on scapular strength, shoulder active range of motion and capsular flexibility.  We will increase appropriate resistive strengthening by week 10 post-surgery with expectations of 90% active range of motion and 60% strength at her 12-week post-surgery anniversary.  OBJECTIVE IMPAIRMENTS: decreased activity tolerance, decreased endurance, decreased knowledge of condition, decreased ROM, decreased strength, decreased safety awareness, increased edema, impaired perceived functional ability, impaired flexibility, impaired UE functional use, postural dysfunction, and pain.   ACTIVITY LIMITATIONS: carrying, lifting, and reach over head  PARTICIPATION LIMITATIONS: community activity and occupation  PERSONAL FACTORS: HTN, migraines, left biceps tenodesis, 01/27/2023 Lt open RTC repair, vertigo, pre-diabetes, HLD are also affecting patient's functional outcome.   REHAB POTENTIAL: Good  CLINICAL DECISION MAKING: Stable/uncomplicated  EVALUATION COMPLEXITY: Low   GOALS: Goals reviewed with patient? Yes  SHORT TERM GOALS: Target date: 04/04/2023  Kayloni  will be independent with her day 1 HEP  Baseline: Started 03/07/2023 Goal status: Met 03/25/2023  2.  Improve left shoulder passive range of motion for flexion to 150 degrees; ER to 70 degrees; IR to 50 degrees. Baseline: 95; 25 and 30 respectively Goal status: Partially met 04/01/2023   LONG TERM GOALS: Target date: 05/02/2023  Improve FOTO to 60 in 17 visits Baseline: 32 Goal status: INITIAL  2.  Improve left shoulder pain to consistently 0-3/10 on the Numeric Pain Rating Scale Baseline: 2-7/10 Goal status: On Going 04/08/2023  3.  Improve left shoulder active range of motion to 90% of flexion 170 degrees; external rotation 90 degrees; internal rotation 70 degrees and horizontal adduction 40 degrees Baseline: See objective Goal status: On Going (<84%) 04/08/2023  4.  Improve left shoulder strength for IR and ER to 60% or better  as compared to the uninvolved right Baseline: Deferred at evaluation secondary to being less than 6 weeks postsurgery Goal status: On Going 04/08/2023  5.  Merary will be independent with her long-term HEP at DC Baseline: Started 03/07/2023 Goal status:  On Going 04/08/2023  PLAN:  PT FREQUENCY: 1-2x/week  PT DURATION: 2 - 4 weeks  PLANNED INTERVENTIONS: 97110-Therapeutic exercises, 97530- Therapeutic activity, 97112- Neuromuscular re-education, 97535- Self Care, [id number]- Manual therapy, 97016- Vasopneumatic device, Patient/Family education, and Cryotherapy  PLAN FOR NEXT SESSION:  capture FOTO, see what MD says,   Appropriate AROM and strength progressions. 11 weeks post-op as of 04/14/23  NEXT MD VISIT: 04/25/23  Corean JULIANNA Ku, PT, DPT 04/19/23 10:57 AM

## 2023-04-21 ENCOUNTER — Inpatient Hospital Stay: Payer: 59 | Attending: Internal Medicine

## 2023-04-25 ENCOUNTER — Encounter: Payer: Self-pay | Admitting: Orthopedic Surgery

## 2023-04-25 ENCOUNTER — Ambulatory Visit (INDEPENDENT_AMBULATORY_CARE_PROVIDER_SITE_OTHER): Payer: 59 | Admitting: Orthopedic Surgery

## 2023-04-25 DIAGNOSIS — M75122 Complete rotator cuff tear or rupture of left shoulder, not specified as traumatic: Secondary | ICD-10-CM

## 2023-04-25 NOTE — Progress Notes (Signed)
 Post-Op Visit Note   Patient: [name]           Date of Birth: 1966/01/09           MRN: [id number] Visit Date: 04/25/2023 PCP: Gretta Comer POUR, NP   Assessment & Plan:  Chief Complaint:  Chief Complaint  Patient presents with   Left Shoulder - Routine Post Op    01/27/23  left shoulder arthroscopy with distal clavicle excision and mini open rotator cuff tear and biceps tenodesis   Visit Diagnoses:  1. Complete tear of left rotator cuff, unspecified whether traumatic     Plan: Patient presents now 3 months out left shoulder arthroscopy with distal clavicle excision and mini open rotator cuff tear repair and biceps tenodesis.  Patient had an atypical tear which was medial to the tendon and the muscle tendon junction range.  She states that she fell on her shoulder 3 weeks ago.  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  On exam she has range of motion of 30/80/120 which is a slight improvement compared to last clinic visit.  Overall based on the morphology of her atypical tear as well as continued slight restriction of range of motion I think would be better for her to return to regular duty in 4 weeks.  Will see her back just before that to make sure she is doing well and then about 2 months after that.  External rotation strength today is symmetric between sides with no coarseness or grinding with internal and external rotation.  Follow-Up Instructions: No follow-ups on file.   Orders:  No orders of the defined types were placed in this encounter.  No orders of the defined types were placed in this encounter.   Imaging: No results found.  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) 11/27/2021   Vitamin D  deficiency 11/27/2021   Chronic fatigue 11/27/2021   Paranoid delusion (HCC) 11/27/2021   GERD (gastroesophageal reflux disease) 04/25/2019   Hyperlipidemia 03/02/2019   Essential hypertension 02/18/2019   Recurrent cystitis 11/27/2012   Migraine with visual aura 09/19/2011   Chronic constipation 06/25/2011   Past Medical History:  Diagnosis Date   Anemia    BV (bacterial vaginosis) 11/27/2012   Celiac disease    Cough due to ACE inhibitor 04/25/2019   Depression    Essential hypertension    Family history of adverse reaction to anesthesia    MOM-HARD TIME WAKING UP   GERD (gastroesophageal reflux disease)    Migraine with visual aura    MIGRAINES   UTI (lower urinary tract infection)     Family History  Problem Relation Age of Onset   Hypertension Mother    Stroke Mother    Cancer Father    Hypertension Maternal Grandmother    Colon cancer Maternal Grandmother    Breast cancer Maternal Grandmother    Skin cancer Maternal Grandmother    Hypertension Maternal Grandfather    Hypertension Paternal Grandmother    Diabetes Paternal Grandmother  Hypertension Paternal Grandfather    Diabetes Son 45       type 1    Past Surgical History:  Procedure Laterality Date   ABDOMINAL HYSTERECTOMY     BICEPT TENODESIS Left 01/27/2023   Procedure: BICEPS TENODESIS;  Surgeon: Addie Cordella Hamilton, MD;  Location: [facility] OR;  Service: Orthopedics;  Laterality: Left;   BLADDER SUSPENSION     COLONOSCOPY WITH PROPOFOL  N/A 05/11/2022   Procedure: COLONOSCOPY WITH PROPOFOL ;  Surgeon: Therisa Bi, MD;  Location: [facility] ENDOSCOPY;  Service: Gastroenterology;  Laterality: N/A;   ESOPHAGOGASTRODUODENOSCOPY (EGD) WITH PROPOFOL  N/A 05/11/2022   Procedure:  ESOPHAGOGASTRODUODENOSCOPY (EGD) WITH PROPOFOL ;  Surgeon: Therisa Bi, MD;  Location: [facility] ENDOSCOPY;  Service: Gastroenterology;  Laterality: N/A;   EXPLORATORY LAPAROTOMY     IUD REMOVAL  07/25/2017   Procedure: INTRAUTERINE DEVICE (IUD) REMOVAL;  Surgeon: Janit Alm Agent, MD;  Location: [facility] ORS;  Service: Gynecology;;   LAPAROSCOPIC ASSISTED VAGINAL HYSTERECTOMY Bilateral 07/25/2017   Procedure: LAPAROSCOPIC ASSISTED VAGINAL HYSTERECTOMY WITH BILATERAL SALPING OOPHERECTOMY;  Surgeon: Janit Alm Agent, MD;  Location: [facility] ORS;  Service: Gynecology;  Laterality: Bilateral;   SHOULDER ARTHROSCOPY WITH OPEN ROTATOR CUFF REPAIR AND DISTAL CLAVICLE ACROMINECTOMY Left 01/27/2023   Procedure: LEFT SHOULDER ARTHROSCOPY, DEBRIDEMENT, DISTAL CLAVICLE EXCISION, MINI OPEN ROTATOR CUFF TEAR REPAIR;  Surgeon: Addie Cordella Hamilton, MD;  Location: [facility] OR;  Service: Orthopedics;  Laterality: Left;   TUBAL LIGATION     Social History   Occupational History   Occupation: Corporate Treasurer  Tobacco Use   Smoking status: Never   Smokeless tobacco: Never  Vaping Use   Vaping status: Never Used  Substance and Sexual Activity   Alcohol use: No    Alcohol/week: 0.0 standard drinks of alcohol   Drug use: No   Sexual activity: Yes    Partners: Female    Birth control/protection: Surgical    Comment: INTERCOURSE AGE 69, SEXUAL PARTNERS LEES THAN 6

## 2023-04-26 ENCOUNTER — Encounter: Payer: Self-pay | Admitting: Physical Therapy

## 2023-04-26 ENCOUNTER — Ambulatory Visit (INDEPENDENT_AMBULATORY_CARE_PROVIDER_SITE_OTHER): Payer: 59 | Admitting: Physical Therapy

## 2023-04-26 DIAGNOSIS — M25612 Stiffness of left shoulder, not elsewhere classified: Secondary | ICD-10-CM | POA: Diagnosis not present

## 2023-04-26 DIAGNOSIS — G8929 Other chronic pain: Secondary | ICD-10-CM

## 2023-04-26 DIAGNOSIS — R293 Abnormal posture: Secondary | ICD-10-CM

## 2023-04-26 DIAGNOSIS — M25512 Pain in left shoulder: Secondary | ICD-10-CM

## 2023-04-26 DIAGNOSIS — M6281 Muscle weakness (generalized): Secondary | ICD-10-CM

## 2023-04-26 NOTE — Therapy (Signed)
 OUTPATIENT PHYSICAL THERAPY SHOULDER TREATMENT   Patient Name: [name] MRN: [id number] DOB:1966/01/28, 58 y.o., female Today's Date: 04/26/2023  END OF SESSION:  PT End of Session - 04/26/23 1004     Visit Number 10    Number of Visits 17    Date for PT Re-Evaluation 05/02/23    Authorization Type [employer]    Progress Note Due on Visit 17    PT Start Time 1005    PT Stop Time 1047    PT Time Calculation (min) 42 min    Activity Tolerance Patient tolerated treatment well    Behavior During Therapy WFL for tasks assessed/performed                 Past Medical History:  Diagnosis Date   Anemia    BV (bacterial vaginosis) 11/27/2012   Celiac disease    Cough due to ACE inhibitor 04/25/2019   Depression    Essential hypertension    Family history of adverse reaction to anesthesia    MOM-HARD TIME WAKING UP   GERD (gastroesophageal reflux disease)    Migraine with visual aura    MIGRAINES   UTI (lower urinary tract infection)    Past Surgical History:  Procedure Laterality Date   ABDOMINAL HYSTERECTOMY     BICEPT TENODESIS Left 01/27/2023   Procedure: BICEPS TENODESIS;  Surgeon: Addie Cordella Hamilton, MD;  Location: [facility] OR;  Service: Orthopedics;  Laterality: Left;   BLADDER SUSPENSION     COLONOSCOPY WITH PROPOFOL  N/A 05/11/2022   Procedure: COLONOSCOPY WITH PROPOFOL ;  Surgeon: Therisa Bi, MD;  Location: [name] ENDOSCOPY;  Service: Gastroenterology;  Laterality: N/A;   ESOPHAGOGASTRODUODENOSCOPY (EGD) WITH PROPOFOL  N/A 05/11/2022   Procedure: ESOPHAGOGASTRODUODENOSCOPY (EGD) WITH PROPOFOL ;  Surgeon: Therisa Bi, MD;  Location: [facility] ENDOSCOPY;  Service: Gastroenterology;  Laterality: N/A;   EXPLORATORY LAPAROTOMY     IUD REMOVAL  07/25/2017   Procedure: INTRAUTERINE DEVICE (IUD) REMOVAL;  Surgeon: Janit Alm Agent, MD;  Location: [facility] ORS;  Service: Gynecology;;   LAPAROSCOPIC ASSISTED VAGINAL HYSTERECTOMY Bilateral 07/25/2017   Procedure: LAPAROSCOPIC ASSISTED VAGINAL  HYSTERECTOMY WITH BILATERAL SALPING OOPHERECTOMY;  Surgeon: Janit Alm Agent, MD;  Location: [facility] ORS;  Service: Gynecology;  Laterality: Bilateral;   SHOULDER ARTHROSCOPY WITH OPEN ROTATOR CUFF REPAIR AND DISTAL CLAVICLE ACROMINECTOMY Left 01/27/2023   Procedure: LEFT SHOULDER ARTHROSCOPY, DEBRIDEMENT, DISTAL CLAVICLE EXCISION, MINI OPEN ROTATOR CUFF TEAR REPAIR;  Surgeon: Addie Cordella Hamilton, MD;  Location: [facility] OR;  Service: Orthopedics;  Laterality: Left;   TUBAL LIGATION     Patient Active Problem List   Diagnosis Date Noted   Vertigo 02/25/2023   Complete tear of left rotator cuff 02/08/2023   Biceps tendonitis, left 02/08/2023   Arthritis of left acromioclavicular joint 02/08/2023   Memory change 12/06/2022   Excessive daytime sleepiness 12/06/2022   Prediabetes 07/29/2022   Persistent cough for 3 weeks or longer 01/21/2022   Iron  deficiency anemia 11/30/2021   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) 11/27/2021   Vitamin D  deficiency 11/27/2021   Chronic fatigue 11/27/2021   Paranoid delusion (HCC) 11/27/2021   GERD (gastroesophageal reflux disease) 04/25/2019   Hyperlipidemia 03/02/2019   Essential hypertension 02/18/2019   Recurrent cystitis 11/27/2012   Migraine with visual aura 09/19/2011   Chronic constipation 06/25/2011    PCP: Comer MARLA Gaskins, NP  REFERRING PROVIDER: Cordella Hamilton Addie, MD  REFERRING DIAG:  Diagnosis  [phone] (ICD-10-CM) - Complete tear of left rotator cuff, unspecified whether traumatic  THERAPY DIAG:  Abnormal posture  Muscle weakness (generalized)  Stiffness of left shoulder, not elsewhere classified  Chronic left shoulder pain  Rationale for Evaluation and Treatment: Rehabilitation  ONSET DATE: 01/27/2023 RTC repair  SUBJECTIVE:                                                                                                                                                                                       SUBJECTIVE STATEMENT: No pain just soreness.  Out of work for about 4 more weeks   Hand dominance: Right  PERTINENT HISTORY: HTN, migraines, left biceps tenodesis, 01/27/2023 Lt open RTC repair, vertigo, pre-diabetes, HLD  PAIN:  Are you having pain? No 0/10 now   PRECAUTIONS: Other: RTC repair  RED FLAGS: None   WEIGHT BEARING RESTRICTIONS:  Stay off the left shoulder  FALLS:  Has patient fallen in last 6 months? No  LIVING ENVIRONMENT: Lives with: lives with their family, lives with their spouse, and lives with an adult companion Lives in: House/apartment Stairs:  No issues Has following equipment at home: None  OCCUPATION: Works in a Personal assistant, works with machines, sits and stands  PLOF:  Was struggling before surgery  PATIENT GOALS: Be able to get back to work and normal function without pain or restriction   OBJECTIVE:  Note: Objective measures were completed at Evaluation unless otherwise noted.  DIAGNOSTIC FINDINGS: Pre-surgery: IMPRESSION: 1. Full-thickness tear of the supraspinatus anteriorly in the region of the myotendinous junction. 2. Mild to moderate rotator cuff tendinosis. 3. Focal tendinosis with probable interstitial tear of the long head biceps tendon in the groove entry zone. 4. Type 2 AC joint injury.  PATIENT SURVEYS:  EVAL: FOTO 32 (Goal 60 in 17 visit) 04/26/23: FOTO 47  COGNITION: Overall cognitive status: Within functional limits for tasks assessed     SENSATION: No complaints of peripheral pain or paresthesias  POSTURE: Significant for mild forward head, internally rotated and protracted shoulders  UPPER EXTREMITY ROM:   Passive ROM Left/Right 03/07/2023 Left 03/25/2023 Left 03/30/23 Left 04/01/2023 Left 04/08/2023 Left  04/19/23  Shoulder flexion 95/165 130 A: 137 (supine) 135 Deferred due to pain A: 117  (sitting)  Shoulder extension        Shoulder abduction      80  (sitting)  Shoulder  horizontal adduction    40 35   Shoulder internal rotation 30/75 50  65 65   Shoulder external rotation 25/95 55  70 50 65  (sitting)  (Blank rows = not tested)  UPPER EXTREMITY STRENGTH: Deferred secondary to being less than 6 weeks post rotator cuff repair at evaluation  In pounds with hand-held  dynamometer Left/Right 03/07/2023 Left/Right 04/08/2023 Left/Right MMT 04/12/23 Left/Right MMT 04/15/23  Shoulder flexion      Shoulder extension      Shoulder abduction      Shoulder adduction      Shoulder internal rotation  14.7/29.1 5/5   Shoulder external rotation  8.7/18.1 4/4   (Blank rows = not tested)    TODAY'S TREATMENT:                                                                                                                                         DATE:  04/26/23 TherEx UBE L4 x 8 min (4 min each direction)  TherAct (Work simulated exercises) Horizontal adduction L1 band 3x15; small range due to pain at end range Horizontal abduction L1 band 3x15 Push/pull blankets across table 3x20 reps Pulling 2 blankets folded from Lt to mid with 1/4 turn x 20 reps Holding 2 blankets with short reach forward with bil UEs x20 reps Lifting 2 blankets from lower surface to higher surface x 20 reps (decreasing low  height every 4-5 reps)   04/19/23 TherEx UBE L2 x 8 min (4 min each direction) Standing AA flexion 2x10; 1# bar; cues to decrease shoulder shrug Standing AA abduction 2x10; 1# bar and cues for shrug Standing wall ladder with 1# wrist weight flexion and scaption x 10 reps each Rows L4 band 2x10; 5 sec hold IR/ER reactive isometrics L2 band x10 reps each Supine shoulder flexion with 5 sec hold at end range for stretch; 2# 2x10 Supine 90 deg flexion circles CW/CCW x 20 each; 2# on Lt Sidelying Lt ER 2x10; 2# ROM measurements - see above for details  04/15/23 UBE L1 x4 min forward/x4 min backward all four extremities MMT check- see above 12x5 second holds L UE  flexion 12x5 second holds L UE scaption 12x5 second holds L shoulder ER towel pinched to ribs with elbow  12x5 second holds L shoulder sleeper stretch sidelying  Supine shoulder flexion 2# 1x12 L UE  Attempted supine shoulder ABD- painful even without weight  Sidelying ER 2# x12  Standing shoulder ABD isometrics L 25% effort 12x3 second holds, no pain     PATIENT EDUCATION: Education details: See above Person educated: Patient Education method: Explanation, Demonstration, Tactile cues, Verbal cues, and Handouts Education comprehension: verbalized understanding, returned demonstration, verbal cues required, tactile cues required, and needs further education  HOME EXERCISE PROGRAM: Access Code: [email or web address] URL: [email or web address] Date: 04/01/2023 Prepared by: Lamar Ivory  Exercises - Standing Scapular Retraction  - 5 x daily - 7 x weekly - 1 sets - 5 reps - 5 second hold - Supine Scapular Protraction in Flexion with Dumbbells  - 2-3 x daily - 7 x weekly - 1 sets - 20 reps - 3 seconds hold - Supine Shoulder Internal Rotation Stretch  - 2-3 x daily - 7 x weekly - 1 sets -  10-20 reps - 10 seconds hold - Supine Shoulder External Rotation Stretch  - 2-3 x daily - 7 x weekly - 1 sets - 20 reps - 10 seconds hold - Supine Shoulder Flexion Extension Full Range AROM  - 2-3 x daily - 7 x weekly - 1 sets - 10 reps - 10 seconds hold - Sidelying Shoulder External Rotation Dumbbell  - 2 x daily - 7 x weekly - 10 reps   ASSESSMENT:  CLINICAL IMPRESSION: Session today focused on work simulated tasks as she hopes to return to work in 4 weeks.  Still having some pain with horizontal abdct/addct so will need to continue those.  FOTO score improved today, not quite to goal.  Will continue to benefit from PT to maximize function.   EVAL: Patient is a 58 y.o. female who was seen today for physical therapy evaluation and treatment for  Diagnosis  M75.122 (ICD-10-CM) - Complete tear of  left rotator cuff, unspecified whether traumatic  .  Vaeda will be 6 weeks post surgery on Thanksgiving day.  She has capsular tightness, pain and functional impairments consistent with being less than 6 weeks status post left rotator cuff repair.  She was set up on a comprehensive rehabilitation program with early emphasis on scapular strength, shoulder active range of motion and capsular flexibility.  We will increase appropriate resistive strengthening by week 10 post-surgery with expectations of 90% active range of motion and 60% strength at her 12-week post-surgery anniversary.  OBJECTIVE IMPAIRMENTS: decreased activity tolerance, decreased endurance, decreased knowledge of condition, decreased ROM, decreased strength, decreased safety awareness, increased edema, impaired perceived functional ability, impaired flexibility, impaired UE functional use, postural dysfunction, and pain.   ACTIVITY LIMITATIONS: carrying, lifting, and reach over head  PARTICIPATION LIMITATIONS: community activity and occupation  PERSONAL FACTORS: HTN, migraines, left biceps tenodesis, 01/27/2023 Lt open RTC repair, vertigo, pre-diabetes, HLD are also affecting patient's functional outcome.   REHAB POTENTIAL: Good  CLINICAL DECISION MAKING: Stable/uncomplicated  EVALUATION COMPLEXITY: Low   GOALS: Goals reviewed with patient? Yes  SHORT TERM GOALS: Target date: 04/04/2023  Damari will be independent with her day 1 HEP  Baseline: Started 03/07/2023 Goal status: Met 03/25/2023  2.  Improve left shoulder passive range of motion for flexion to 150 degrees; ER to 70 degrees; IR to 50 degrees. Baseline: 95; 25 and 30 respectively Goal status: Partially met 04/01/2023   LONG TERM GOALS: Target date: 05/02/2023  Improve FOTO to 60 in 17 visits Baseline: 32 Goal status: ONGOING 04/26/23  2.  Improve left shoulder pain to consistently 0-3/10 on the Numeric Pain Rating Scale Baseline: 2-7/10 Goal status: On  Going 04/08/2023  3.  Improve left shoulder active range of motion to 90% of flexion 170 degrees; external rotation 90 degrees; internal rotation 70 degrees and horizontal adduction 40 degrees Baseline: See objective Goal status: On Going (<84%) 04/08/2023  4.  Improve left shoulder strength for IR and ER to 60% or better as compared to the uninvolved right Baseline: Deferred at evaluation secondary to being less than 6 weeks postsurgery Goal status: On Going 04/08/2023  5.  Jermia will be independent with her long-term HEP at DC Baseline: Started 03/07/2023 Goal status:  On Going 04/08/2023  PLAN:  PT FREQUENCY: 1-2x/week  PT DURATION: 2 - 4 weeks  PLANNED INTERVENTIONS: 97110-Therapeutic exercises, 97530- Therapeutic activity, 97112- Neuromuscular re-education, 97535- Self Care, [id number]- Manual therapy, [id number]- Vasopneumatic device, Patient/Family education, and Cryotherapy  PLAN FOR NEXT SESSION: continue strengthening/work simulated tasks; Appropriate  AROM and strength progressions. 11 weeks post-op as of 04/14/23  NEXT MD VISIT: 05/23/23  Corean JULIANNA Ku, PT, DPT 04/26/23 10:50 AM

## 2023-04-28 ENCOUNTER — Ambulatory Visit: Payer: 59 | Admitting: Rehabilitative and Restorative Service Providers"

## 2023-04-28 ENCOUNTER — Encounter: Payer: Self-pay | Admitting: Rehabilitative and Restorative Service Providers"

## 2023-04-28 DIAGNOSIS — R293 Abnormal posture: Secondary | ICD-10-CM | POA: Diagnosis not present

## 2023-04-28 DIAGNOSIS — M25612 Stiffness of left shoulder, not elsewhere classified: Secondary | ICD-10-CM | POA: Diagnosis not present

## 2023-04-28 DIAGNOSIS — M6281 Muscle weakness (generalized): Secondary | ICD-10-CM | POA: Diagnosis not present

## 2023-04-28 DIAGNOSIS — M25512 Pain in left shoulder: Secondary | ICD-10-CM | POA: Diagnosis not present

## 2023-04-28 DIAGNOSIS — G8929 Other chronic pain: Secondary | ICD-10-CM

## 2023-04-28 NOTE — Therapy (Signed)
OUTPATIENT PHYSICAL THERAPY SHOULDER TREATMENT/PROGRESS NOTE  Progress Note Reporting Period 03/07/2023 to 04/28/2023  See note below for Objective Data and Assessment of Progress/Goals.     Patient Name: [name] MRN: [id number] DOB:Jun 20, 1965, 58 y.o., female Today's Date: 04/28/2023  END OF SESSION:  PT End of Session - 04/28/23 1301     Visit Number 11    Number of Visits 17    Date for PT Re-Evaluation 05/02/23    Authorization Type [employer]    Progress Note Due on Visit 17    PT Start Time 1300    PT Stop Time 1345    PT Time Calculation (min) 45 min    Activity Tolerance Patient tolerated treatment well    Behavior During Therapy WFL for tasks assessed/performed             Past Medical History:  Diagnosis Date   Anemia    BV (bacterial vaginosis) 11/27/2012   Celiac disease    Cough due to ACE inhibitor 04/25/2019   Depression    Essential hypertension    Family history of adverse reaction to anesthesia    MOM-HARD TIME WAKING UP   GERD (gastroesophageal reflux disease)    Migraine with visual aura    MIGRAINES   UTI (lower urinary tract infection)    Past Surgical History:  Procedure Laterality Date   ABDOMINAL HYSTERECTOMY     BICEPT TENODESIS Left 01/27/2023   Procedure: BICEPS TENODESIS;  Surgeon: Cammy Copa, MD;  Location: [facility] OR;  Service: Orthopedics;  Laterality: Left;   BLADDER SUSPENSION     COLONOSCOPY WITH PROPOFOL N/A 05/11/2022   Procedure: COLONOSCOPY WITH PROPOFOL;  Surgeon: Wyline Mood, MD;  Location: [facility] ENDOSCOPY;  Service: Gastroenterology;  Laterality: N/A;   ESOPHAGOGASTRODUODENOSCOPY (EGD) WITH PROPOFOL N/A 05/11/2022   Procedure: ESOPHAGOGASTRODUODENOSCOPY (EGD) WITH PROPOFOL;  Surgeon: Wyline Mood, MD;  Location: [facility] ENDOSCOPY;  Service: Gastroenterology;  Laterality: N/A;   EXPLORATORY LAPAROTOMY     IUD REMOVAL  07/25/2017   Procedure: INTRAUTERINE DEVICE (IUD) REMOVAL;  Surgeon: Linzie Collin, MD;  Location: [facility]  ORS;  Service: Gynecology;;   LAPAROSCOPIC ASSISTED VAGINAL HYSTERECTOMY Bilateral 07/25/2017   Procedure: LAPAROSCOPIC ASSISTED VAGINAL HYSTERECTOMY WITH BILATERAL SALPING OOPHERECTOMY;  Surgeon: Linzie Collin, MD;  Location: [facility] ORS;  Service: Gynecology;  Laterality: Bilateral;   SHOULDER ARTHROSCOPY WITH OPEN ROTATOR CUFF REPAIR AND DISTAL CLAVICLE ACROMINECTOMY Left 01/27/2023   Procedure: LEFT SHOULDER ARTHROSCOPY, DEBRIDEMENT, DISTAL CLAVICLE EXCISION, MINI OPEN ROTATOR CUFF TEAR REPAIR;  Surgeon: Cammy Copa, MD;  Location: [facility] OR;  Service: Orthopedics;  Laterality: Left;   TUBAL LIGATION     Patient Active Problem List   Diagnosis Date Noted   Vertigo 02/25/2023   Complete tear of left rotator cuff 02/08/2023   Biceps tendonitis, left 02/08/2023   Arthritis of left acromioclavicular joint 02/08/2023   Memory change 12/06/2022   Excessive daytime sleepiness 12/06/2022   Prediabetes 07/29/2022   Persistent cough for 3 weeks or longer 01/21/2022   Iron deficiency anemia 11/30/2021   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) 11/27/2021   Vitamin D deficiency 11/27/2021   Chronic fatigue 11/27/2021   Paranoid delusion (HCC) 11/27/2021   GERD (gastroesophageal reflux disease) 04/25/2019   Hyperlipidemia 03/02/2019   Essential hypertension 02/18/2019   Recurrent cystitis 11/27/2012   Migraine with visual aura 09/19/2011   Chronic constipation 06/25/2011    PCP: Doreene Nest, NP  REFERRING PROVIDER: Cammy Copa, MD  REFERRING DIAG:  Diagnosis  M75.122 (ICD-10-CM) - Complete tear of left rotator cuff, unspecified whether traumatic    THERAPY DIAG:  Abnormal posture  Muscle weakness (generalized)  Stiffness of left shoulder, not elsewhere classified  Chronic left shoulder pain  Rationale for Evaluation and Treatment: Rehabilitation  ONSET DATE: 01/27/2023 RTC repair  SUBJECTIVE:                                                                                                                                                                                       SUBJECTIVE STATEMENT: [name] notes she is out of work until February 10.  She reports compliance with her home exercise program which has been modified over the last several weeks.   Hand dominance: Right  PERTINENT HISTORY: HTN, migraines, left biceps tenodesis, 01/27/2023 Lt open RTC repair, vertigo, pre-diabetes, HLD  PAIN:  Are you having pain? No 0/10 now.  3/10 at worst over the past week.  PRECAUTIONS: Other: RTC repair  RED FLAGS: None   WEIGHT BEARING RESTRICTIONS:  Stay off the left shoulder  FALLS:  Has patient fallen in last 6 months? No  LIVING ENVIRONMENT: Lives with: lives with their family, lives with their spouse, and lives with an adult companion Lives in: House/apartment Stairs:  No issues Has following equipment at home: None  OCCUPATION: Works in a Personal assistant, works with machines, sits and stands  PLOF:  Was struggling before surgery  PATIENT GOALS: Be able to get back to work and normal function without pain or restriction   OBJECTIVE:  Note: Objective measures were completed at Evaluation unless otherwise noted.  DIAGNOSTIC FINDINGS: Pre-surgery: IMPRESSION: 1. Full-thickness tear of the supraspinatus anteriorly in the region of the myotendinous junction. 2. Mild to moderate rotator cuff tendinosis. 3. Focal tendinosis with probable interstitial tear of the long head biceps tendon in the groove entry zone. 4. Type 2 AC joint injury.  PATIENT SURVEYS:  EVAL: FOTO 32 (Goal 60 in 17 visit) 04/26/23: FOTO 47  COGNITION: Overall cognitive status: Within functional limits for tasks assessed     SENSATION: No complaints of peripheral pain or paresthesias  POSTURE: Significant for mild forward head, internally rotated and protracted shoulders  UPPER EXTREMITY ROM:   Passive ROM  Left/Right 03/07/2023 Left 03/25/2023 Left 03/30/23 Left 04/01/2023 Left 04/08/2023 Left  04/19/23 Left 04/28/2023  Shoulder flexion 95/165 130 A: 137 (supine) 135 Deferred due to pain A: 117  (sitting) 135  Shoulder extension         Shoulder abduction      80  (sitting)   Shoulder horizontal adduction    40 35  35  Shoulder internal  rotation 30/75 50  65 65  60  Shoulder external rotation 25/95 55  70 50 65  (sitting) 45  (Blank rows = not tested)  UPPER EXTREMITY STRENGTH: Deferred secondary to being less than 6 weeks post rotator cuff repair at evaluation  In pounds with hand-held dynamometer Left/Right 03/07/2023 Left/Right 04/08/2023 Left/Right MMT 04/12/23 Left/Right MMT 04/15/23  Shoulder flexion      Shoulder extension      Shoulder abduction      Shoulder adduction      Shoulder internal rotation  14.7/29.1 5/5 21.3/30.4  Shoulder external rotation  8.7/18.1 4/4 10.0/19.0  (Blank rows = not tested)    TODAY'S TREATMENT:                                                                                                                                         DATE:  04/27/2022 Supine am raises/Scapular protraction 20 x 3 seconds with 5# Supine IR stretch 10 x 10 seconds Supine ER stretch 20 x 10 seconds Supine shoulder flexion with palm in and protract first (keep elbow in by the ear) 20 x 10 seconds Shoulder blade pinches/scapular retraction 10 x 5 seconds Thera-Band ER 2 sets of 10 with slow eccentrics Red Thera-Band IR 10 times with slow eccentrics Red  Functional Activities: Functional reaching and overhead testing revealed tightness with flexion and external rotation.  This was also seen with objective measures.  [name] and I spent the last 8 minutes of an appointment reviewing her updated home exercise program which is focused on only 5 exercises (scapular retraction, rotator cuff strengthening for internal and external rotation, capsular stretching into external rotation  and flexion)   04/26/23 TherEx UBE L4 x 8 min (4 min each direction)  TherAct (Work simulated exercises) Horizontal adduction L1 band 3x15; small range due to pain at end range Horizontal abduction L1 band 3x15 Push/pull blankets across table 3x20 reps Pulling 2 blankets folded from Lt to mid with 1/4 turn x 20 reps Holding 2 blankets with short reach forward with bil UEs x20 reps Lifting 2 blankets from lower surface to higher surface x 20 reps (decreasing low  height every 4-5 reps)   04/19/23 TherEx UBE L2 x 8 min (4 min each direction) Standing AA flexion 2x10; 1# bar; cues to decrease shoulder shrug Standing AA abduction 2x10; 1# bar and cues for shrug Standing wall ladder with 1# wrist weight flexion and scaption x 10 reps each Rows L4 band 2x10; 5 sec hold IR/ER reactive isometrics L2 band x10 reps each Supine shoulder flexion with 5 sec hold at end range for stretch; 2# 2x10 Supine 90 deg flexion circles CW/CCW x 20 each; 2# on Lt Sidelying Lt ER 2x10; 2# ROM measurements - see above for details  PATIENT EDUCATION: Education details: See above Person educated: Patient Education method: Explanation, Demonstration, Tactile cues, Verbal cues, and Handouts Education comprehension:  verbalized understanding, returned demonstration, verbal cues required, tactile cues required, and needs further education  HOME EXERCISE PROGRAM: Access Code: [email or web address] URL: [email or web address] Date: 04/28/2023 Prepared by: Pauletta Browns  Exercises - Standing Scapular Retraction  - 5 x daily - 7 x weekly - 1 sets - 5 reps - 5 second hold - Supine Scapular Protraction in Flexion with Dumbbells  - 2-3 x daily - 1 x weekly - 1 sets - 20 reps - 3 seconds hold - Supine Shoulder Internal Rotation Stretch  - 2-3 x daily - 1 x weekly - 1 sets - 10-20 reps - 10 seconds hold - Supine Shoulder External Rotation Stretch  - 2-3 x daily - 7 x weekly - 1 sets - 20 reps - 10 seconds hold -  Supine Shoulder Flexion Extension Full Range AROM  - 2-3 x daily - 7 x weekly - 1 sets - 20 reps - 10 seconds hold - Sidelying Shoulder External Rotation Dumbbell  - 2 x daily - 1 x weekly - 10 reps - Seated Shoulder Flexion AAROM with Pulley Behind  - 2-3 x daily - 1 x weekly - 10 reps - 10 seconds hold - Shoulder External Rotation with Anchored Resistance  - 1 x daily - 7 x weekly - 2 sets - 10 reps - 3 seconds hold - Shoulder Internal Rotation with Resistance  - 1 x daily - 7 x weekly - 1 sets - 10 reps - 3 seconds hold  ASSESSMENT:  CLINICAL IMPRESSION: Sparkles has excellent strength at the 60-month post rotator cuff repair point.  Her involved shoulder has 63% strength as compared to the uninvolved right (60% expected).  Active range of motion is a bit more limited (76%, 90% or greater expected) with particular limitations in flexion and external rotation.  Her home exercise program was modified today to place almost exclusive effort on these capsular limitations along with continued rotator cuff and scapular strengthening.  I recommend Lamiyah continue supervised physical therapy once a week up to her follow-up appointment with Dr. August Saucer on February 10 with another active range of motion, strength and functional assessment at that time for additional recommendations regarding return to work.   EVAL: Patient is a 58 y.o. female who was seen today for physical therapy evaluation and treatment for  Diagnosis  M75.122 (ICD-10-CM) - Complete tear of left rotator cuff, unspecified whether traumatic  .  Leeah will be 6 weeks post surgery on Thanksgiving day.  She has capsular tightness, pain and functional impairments consistent with being less than 6 weeks status post left rotator cuff repair.  She was set up on a comprehensive rehabilitation program with early emphasis on scapular strength, shoulder active range of motion and capsular flexibility.  We will increase appropriate resistive strengthening by week  10 post-surgery with expectations of 90% active range of motion and 60% strength at her 12-week post-surgery anniversary.  OBJECTIVE IMPAIRMENTS: decreased activity tolerance, decreased endurance, decreased knowledge of condition, decreased ROM, decreased strength, decreased safety awareness, increased edema, impaired perceived functional ability, impaired flexibility, impaired UE functional use, postural dysfunction, and pain.   ACTIVITY LIMITATIONS: carrying, lifting, and reach over head  PARTICIPATION LIMITATIONS: community activity and occupation  PERSONAL FACTORS: HTN, migraines, left biceps tenodesis, 01/27/2023 Lt open RTC repair, vertigo, pre-diabetes, HLD are also affecting patient's functional outcome.   REHAB POTENTIAL: Good  CLINICAL DECISION MAKING: Stable/uncomplicated  EVALUATION COMPLEXITY: Low   GOALS: Goals reviewed with patient? Yes  SHORT TERM GOALS: Target date: 04/04/2023  Jaleel will be independent with her day 1 HEP  Baseline: Started 03/07/2023 Goal status: Met 03/25/2023  2.  Improve left shoulder passive range of motion for flexion to 150 degrees; ER to 70 degrees; IR to 50 degrees. Baseline: 95; 25 and 30 respectively Goal status: Partially met 04/28/2023   LONG TERM GOALS: Target date: 05/23/2023  Improve FOTO to 60 in 17 visits Baseline: 32 Goal status: On Going 04/26/23  2.  Improve left shoulder pain to consistently 0-3/10 on the Numeric Pain Rating Scale Baseline: 2-7/10 Goal status: Met 04/28/2023  3.  Improve left shoulder active range of motion to 90% of flexion 170 degrees; external rotation 90 degrees; internal rotation 70 degrees and horizontal adduction 40 degrees Baseline: See objective Goal status: On Going (76%) 04/28/2023  4.  Improve left shoulder strength for IR and ER to 60% or better as compared to the uninvolved right Baseline: Deferred at evaluation secondary to being less than 6 weeks postsurgery Goal status: Met  04/28/2023  5.  Mykaila will be independent with her long-term HEP at DC Baseline: Started 03/07/2023 Goal status:  On Going 04/28/2023  PLAN:  PT FREQUENCY: 1 time a week  PT DURATION: 3 - 4 weeks  PLANNED INTERVENTIONS: 97110-Therapeutic exercises, 97530- Therapeutic activity, 97112- Neuromuscular re-education, 97535- Self Care, [id number]- Manual therapy, 97016- Vasopneumatic device, Patient/Family education, and Cryotherapy  PLAN FOR NEXT SESSION: Heavy emphasis on external rotation and flexion active range of motion.  Continue appropriate scapular and rotator cuff strength activities, although limitations in external rotation and flexion active range of motion of the only things that have not met long-term goals.  NEXT MD VISIT: 05/23/23  Cherlyn Cushing PT, MPT  04/28/23 3:38 PM

## 2023-05-02 ENCOUNTER — Encounter: Payer: 59 | Admitting: Physical Therapy

## 2023-05-02 ENCOUNTER — Telehealth: Payer: Self-pay | Admitting: Physical Therapy

## 2023-05-02 NOTE — Telephone Encounter (Signed)
Spoke with pt as she did not show for her PT appt.  She stated she forgot to call but was decreasing her freq to 1x/wk.  Changed schedule to 1x/wk going forward and reminded of next scheduled appt.  Clarita Crane, PT, DPT 05/02/23 10:39 AM  [facility] [address] [place], [place], [address] Phone: [phone]   Fax:  [phone]

## 2023-05-05 ENCOUNTER — Ambulatory Visit: Payer: 59 | Admitting: Physical Therapy

## 2023-05-05 ENCOUNTER — Encounter: Payer: Self-pay | Admitting: Physical Therapy

## 2023-05-05 DIAGNOSIS — M6281 Muscle weakness (generalized): Secondary | ICD-10-CM

## 2023-05-05 DIAGNOSIS — R600 Localized edema: Secondary | ICD-10-CM

## 2023-05-05 DIAGNOSIS — G8929 Other chronic pain: Secondary | ICD-10-CM

## 2023-05-05 DIAGNOSIS — M25512 Pain in left shoulder: Secondary | ICD-10-CM

## 2023-05-05 DIAGNOSIS — R293 Abnormal posture: Secondary | ICD-10-CM

## 2023-05-05 DIAGNOSIS — M25612 Stiffness of left shoulder, not elsewhere classified: Secondary | ICD-10-CM | POA: Diagnosis not present

## 2023-05-05 NOTE — Therapy (Signed)
OUTPATIENT PHYSICAL THERAPY SHOULDER TREATMENT NOTE/RECERT      Patient Name: [name] MRN: [id number] DOB:1966/01/13, 58 y.o., female Today's Date: 05/05/2023  END OF SESSION:  PT End of Session - 05/05/23 0917     Visit Number 12    Number of Visits 17    Date for PT Re-Evaluation 06/02/23    Authorization Type [employer]    Progress Note Due on Visit 17    PT Start Time 0847    PT Stop Time 0928    PT Time Calculation (min) 41 min    Activity Tolerance Patient tolerated treatment well    Behavior During Therapy [facility] for tasks assessed/performed              Past Medical History:  Diagnosis Date   Anemia    BV (bacterial vaginosis) 11/27/2012   Celiac disease    Cough due to ACE inhibitor 04/25/2019   Depression    Essential hypertension    Family history of adverse reaction to anesthesia    MOM-HARD TIME WAKING UP   GERD (gastroesophageal reflux disease)    Migraine with visual aura    MIGRAINES   UTI (lower urinary tract infection)    Past Surgical History:  Procedure Laterality Date   ABDOMINAL HYSTERECTOMY     BICEPT TENODESIS Left 01/27/2023   Procedure: BICEPS TENODESIS;  Surgeon: Cammy Copa, MD;  Location: [facility] OR;  Service: Orthopedics;  Laterality: Left;   BLADDER SUSPENSION     COLONOSCOPY WITH PROPOFOL N/A 05/11/2022   Procedure: COLONOSCOPY WITH PROPOFOL;  Surgeon: Wyline Mood, MD;  Location: [facility] ENDOSCOPY;  Service: Gastroenterology;  Laterality: N/A;   ESOPHAGOGASTRODUODENOSCOPY (EGD) WITH PROPOFOL N/A 05/11/2022   Procedure: ESOPHAGOGASTRODUODENOSCOPY (EGD) WITH PROPOFOL;  Surgeon: Wyline Mood, MD;  Location: [facility] ENDOSCOPY;  Service: Gastroenterology;  Laterality: N/A;   EXPLORATORY LAPAROTOMY     IUD REMOVAL  07/25/2017   Procedure: INTRAUTERINE DEVICE (IUD) REMOVAL;  Surgeon: Linzie Collin, MD;  Location: [facility] ORS;  Service: Gynecology;;   LAPAROSCOPIC ASSISTED VAGINAL HYSTERECTOMY Bilateral 07/25/2017   Procedure: LAPAROSCOPIC  ASSISTED VAGINAL HYSTERECTOMY WITH BILATERAL SALPING OOPHERECTOMY;  Surgeon: Linzie Collin, MD;  Location: [facility] ORS;  Service: Gynecology;  Laterality: Bilateral;   SHOULDER ARTHROSCOPY WITH OPEN ROTATOR CUFF REPAIR AND DISTAL CLAVICLE ACROMINECTOMY Left 01/27/2023   Procedure: LEFT SHOULDER ARTHROSCOPY, DEBRIDEMENT, DISTAL CLAVICLE EXCISION, MINI OPEN ROTATOR CUFF TEAR REPAIR;  Surgeon: Cammy Copa, MD;  Location: [facility] OR;  Service: Orthopedics;  Laterality: Left;   TUBAL LIGATION     Patient Active Problem List   Diagnosis Date Noted   Vertigo 02/25/2023   Complete tear of left rotator cuff 02/08/2023   Biceps tendonitis, left 02/08/2023   Arthritis of left acromioclavicular joint 02/08/2023   Memory change 12/06/2022   Excessive daytime sleepiness 12/06/2022   Prediabetes 07/29/2022   Persistent cough for 3 weeks or longer 01/21/2022   Iron deficiency anemia 11/30/2021   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) 11/27/2021   Vitamin D deficiency 11/27/2021   Chronic fatigue 11/27/2021   Paranoid delusion (HCC) 11/27/2021   GERD (gastroesophageal reflux disease) 04/25/2019   Hyperlipidemia 03/02/2019   Essential hypertension 02/18/2019   Recurrent cystitis 11/27/2012   Migraine with visual aura 09/19/2011   Chronic constipation 06/25/2011    PCP: Doreene Nest, NP  REFERRING PROVIDER: Cammy Copa, MD  REFERRING DIAG:  Diagnosis  [phone] (ICD-10-CM) - Complete tear of left rotator cuff, unspecified whether traumatic  THERAPY DIAG:  Abnormal posture  Muscle weakness (generalized)  Stiffness of left shoulder, not elsewhere classified  Chronic left shoulder pain  Localized edema  Rationale for Evaluation and Treatment: Rehabilitation  ONSET DATE: 01/27/2023 RTC repair  SUBJECTIVE:                                                                                                                                                                                       SUBJECTIVE STATEMENT: She notes some soreness in her left shoulder that is sensitive to the touch.   Hand dominance: Right  PERTINENT HISTORY: HTN, migraines, left biceps tenodesis, 01/27/2023 Lt open RTC repair, vertigo, pre-diabetes, HLD  PAIN:  Are you having pain? No 0/10 now.  3-4/10 at worst over the past week.  PRECAUTIONS: Other: RTC repair  RED FLAGS: None   WEIGHT BEARING RESTRICTIONS:  Stay off the left shoulder  FALLS:  Has patient fallen in last 6 months? No  LIVING ENVIRONMENT: Lives with: lives with their family, lives with their spouse, and lives with an adult companion Lives in: House/apartment Stairs:  No issues Has following equipment at home: None  OCCUPATION: Works in a Personal assistant, works with machines, sits and stands  PLOF:  Was struggling before surgery  PATIENT GOALS: Be able to get back to work and normal function without pain or restriction   OBJECTIVE:  Note: Objective measures were completed at Evaluation unless otherwise noted.  DIAGNOSTIC FINDINGS: Pre-surgery: IMPRESSION: 1. Full-thickness tear of the supraspinatus anteriorly in the region of the myotendinous junction. 2. Mild to moderate rotator cuff tendinosis. 3. Focal tendinosis with probable interstitial tear of the long head biceps tendon in the groove entry zone. 4. Type 2 AC joint injury.  PATIENT SURVEYS:  EVAL: FOTO 32 (Goal 60 in 17 visit) 04/26/23: FOTO 47  COGNITION: Overall cognitive status: Within functional limits for tasks assessed     SENSATION: No complaints of peripheral pain or paresthesias  POSTURE: Significant for mild forward head, internally rotated and protracted shoulders  UPPER EXTREMITY ROM:   Passive ROM Left/Right 03/07/2023 Left 03/25/2023 Left 03/30/23 Left 04/01/2023 Left 04/08/2023 Left  04/19/23 Left 04/28/2023  Shoulder flexion 95/165 130 A: 137 (supine) 135 Deferred due to  pain A: 117  (sitting) 135  Shoulder extension         Shoulder abduction      80  (sitting)   Shoulder horizontal adduction    40 35  35  Shoulder internal rotation 30/75 50  65 65  60  Shoulder external rotation 25/95 55  70 50 65  (sitting) 45  (Blank rows = not tested)  UPPER EXTREMITY STRENGTH:  Deferred secondary to being less than 6 weeks post rotator cuff repair at evaluation  In pounds with hand-held dynamometer Left/Right 03/07/2023 Left/Right 04/08/2023 Left/Right MMT 04/12/23 Left/Right MMT 04/15/23  Shoulder flexion      Shoulder extension      Shoulder abduction      Shoulder adduction      Shoulder internal rotation  14.7/29.1 5/5 21.3/30.4  Shoulder external rotation  8.7/18.1 4/4 10.0/19.0  (Blank rows = not tested)    TODAY'S TREATMENT:                                                                                                                                         DATE:  05/05/23 UBE L2 4 min forward, 4 min backward Row machine 20# 2X10 Chest press machine 5 #2X10 Shoulder IR red 2X15 Shoulder ER red 2X15 Shoulder extensions red 2X15 Shoulder flexion AAROM 2# bar  X10 Shoulder abuction AAROM 2# bar X 10 Shoulder IR stretch 5 sec X 10 Shoulder ER stretch 5 sec X 10 at doorway Shoulder flexion stretch 5 sec X 10 at doorway -Vasopnuematic device X 10 min, medium compression, 34 deg to Lt shoulder due to edema noted in left lateral shoulder     04/27/2022 Supine am raises/Scapular protraction 20 x 3 seconds with 5# Supine IR stretch 10 x 10 seconds Supine ER stretch 20 x 10 seconds Supine shoulder flexion with palm in and protract first (keep elbow in by the ear) 20 x 10 seconds Shoulder blade pinches/scapular retraction 10 x 5 seconds Thera-Band ER 2 sets of 10 with slow eccentrics Red Thera-Band IR 10 times with slow eccentrics Red  Functional Activities: Functional reaching and overhead testing revealed tightness with flexion and external  rotation.  This was also seen with objective measures.  [name] and I spent the last 8 minutes of an appointment reviewing her updated home exercise program which is focused on only 5 exercises (scapular retraction, rotator cuff strengthening for internal and external rotation, capsular stretching into external rotation and flexion)   04/26/23 TherEx UBE L4 x 8 min (4 min each direction)  TherAct (Work simulated exercises) Horizontal adduction L1 band 3x15; small range due to pain at end range Horizontal abduction L1 band 3x15 Push/pull blankets across table 3x20 reps Pulling 2 blankets folded from Lt to mid with 1/4 turn x 20 reps Holding 2 blankets with short reach forward with bil UEs x20 reps Lifting 2 blankets from lower surface to higher surface x 20 reps (decreasing low  height every 4-5 reps)   PATIENT EDUCATION: Education details: See above Person educated: Patient Education method: Explanation, Demonstration, Tactile cues, Verbal cues, and Handouts Education comprehension: verbalized understanding, returned demonstration, verbal cues required, tactile cues required, and needs further education  HOME EXERCISE PROGRAM: Access Code: [email or web address] URL: [email or web address] Date: 04/28/2023 Prepared by: [name]  Exercises - Standing Scapular Retraction  - 5  x daily - 7 x weekly - 1 sets - 5 reps - 5 second hold - Supine Scapular Protraction in Flexion with Dumbbells  - 2-3 x daily - 1 x weekly - 1 sets - 20 reps - 3 seconds hold - Supine Shoulder Internal Rotation Stretch  - 2-3 x daily - 1 x weekly - 1 sets - 10-20 reps - 10 seconds hold - Supine Shoulder External Rotation Stretch  - 2-3 x daily - 7 x weekly - 1 sets - 20 reps - 10 seconds hold - Supine Shoulder Flexion Extension Full Range AROM  - 2-3 x daily - 7 x weekly - 1 sets - 20 reps - 10 seconds hold - Sidelying Shoulder External Rotation Dumbbell  - 2 x daily - 1 x weekly - 10 reps - Seated Shoulder  Flexion AAROM with Pulley Behind  - 2-3 x daily - 1 x weekly - 10 reps - 10 seconds hold - Shoulder External Rotation with Anchored Resistance  - 1 x daily - 7 x weekly - 2 sets - 10 reps - 3 seconds hold - Shoulder Internal Rotation with Resistance  - 1 x daily - 7 x weekly - 1 sets - 10 reps - 3 seconds hold  ASSESSMENT:  CLINICAL IMPRESSION: Recert today as her PT plan of care date had expired. She has made progress with PT but still with some ROM limitations overhead, ER and IR as well as some strength deficits thus PT recommending to continue with PT up to 4 more weeks to maximize function   EVAL: Patient is a 58 y.o. female who was seen today for physical therapy evaluation and treatment for  Diagnosis  M75.122 (ICD-10-CM) - Complete tear of left rotator cuff, unspecified whether traumatic  .  Via will be 6 weeks post surgery on Thanksgiving day.  She has capsular tightness, pain and functional impairments consistent with being less than 6 weeks status post left rotator cuff repair.  She was set up on a comprehensive rehabilitation program with early emphasis on scapular strength, shoulder active range of motion and capsular flexibility.  We will increase appropriate resistive strengthening by week 10 post-surgery with expectations of 90% active range of motion and 60% strength at her 12-week post-surgery anniversary.  OBJECTIVE IMPAIRMENTS: decreased activity tolerance, decreased endurance, decreased knowledge of condition, decreased ROM, decreased strength, decreased safety awareness, increased edema, impaired perceived functional ability, impaired flexibility, impaired UE functional use, postural dysfunction, and pain.   ACTIVITY LIMITATIONS: carrying, lifting, and reach over head  PARTICIPATION LIMITATIONS: community activity and occupation  PERSONAL FACTORS: HTN, migraines, left biceps tenodesis, 01/27/2023 Lt open RTC repair, vertigo, pre-diabetes, HLD are also affecting patient's  functional outcome.   REHAB POTENTIAL: Good  CLINICAL DECISION MAKING: Stable/uncomplicated  EVALUATION COMPLEXITY: Low   GOALS: Goals reviewed with patient? Yes  SHORT TERM GOALS: Target date: 04/04/2023  [name] will be independent with her day 1 HEP  Baseline: Started 03/07/2023 Goal status: Met 03/25/2023  2.  Improve left shoulder passive range of motion for flexion to 150 degrees; ER to 70 degrees; IR to 50 degrees. Baseline: 95; 25 and 30 respectively Goal status: Partially met 04/28/2023   LONG TERM GOALS: Target date: 06/02/2023  Improve FOTO to 60 in 17 visits Baseline: 32 Goal status: On Going 04/26/23  2.  Improve left shoulder pain to consistently 0-3/10 on the Numeric Pain Rating Scale Baseline: 2-7/10 Goal status: Met 04/28/2023  3.  Improve left shoulder active range of motion to  90% of flexion 170 degrees; external rotation 90 degrees; internal rotation 70 degrees and horizontal adduction 40 degrees Baseline: See objective Goal status: On Going (76%) 04/28/2023  4.  Improve left shoulder strength for IR and ER to 60% or better as compared to the uninvolved right Baseline: Deferred at evaluation secondary to being less than 6 weeks postsurgery Goal status: Met 04/28/2023  5.  [name] will be independent with her long-term HEP at DC Baseline: Started 03/07/2023 Goal status:  On Going 04/28/2023  PLAN:  PT FREQUENCY: 1 time a week  PT DURATION: 3 - 4 weeks  PLANNED INTERVENTIONS: 97110-Therapeutic exercises, 97530- Therapeutic activity, 97112- Neuromuscular re-education, 97535- Self Care, [id number]- Manual therapy, [id number]- Vasopneumatic device, Patient/Family education, and Cryotherapy  PLAN FOR NEXT SESSION: Progress back to work activities,  Heavy emphasis on external rotation and flexion active range of motion.  Continue appropriate scapular and rotator cuff strength activities, although limitations in external rotation and flexion active range of motion of the  only things that have not met long-term goals.  NEXT MD VISIT: 05/23/23  [name], PT, DPT 05/05/23 9:19 AM

## 2023-05-06 ENCOUNTER — Other Ambulatory Visit: Payer: Self-pay

## 2023-05-06 DIAGNOSIS — Z1231 Encounter for screening mammogram for malignant neoplasm of breast: Secondary | ICD-10-CM

## 2023-05-10 ENCOUNTER — Encounter: Payer: 59 | Admitting: Physical Therapy

## 2023-05-12 ENCOUNTER — Ambulatory Visit: Payer: 59 | Admitting: Physical Therapy

## 2023-05-12 ENCOUNTER — Encounter: Payer: Self-pay | Admitting: Physical Therapy

## 2023-05-12 DIAGNOSIS — G8929 Other chronic pain: Secondary | ICD-10-CM

## 2023-05-12 DIAGNOSIS — R293 Abnormal posture: Secondary | ICD-10-CM

## 2023-05-12 DIAGNOSIS — M6281 Muscle weakness (generalized): Secondary | ICD-10-CM | POA: Diagnosis not present

## 2023-05-12 DIAGNOSIS — R600 Localized edema: Secondary | ICD-10-CM

## 2023-05-12 DIAGNOSIS — M25512 Pain in left shoulder: Secondary | ICD-10-CM | POA: Diagnosis not present

## 2023-05-12 DIAGNOSIS — M25612 Stiffness of left shoulder, not elsewhere classified: Secondary | ICD-10-CM

## 2023-05-12 NOTE — Therapy (Signed)
OUTPATIENT PHYSICAL THERAPY SHOULDER TREATMENT NOTE      Patient Name: [name] MRN: [id number] DOB:05-Jun-1965, 58 y.o., female Today's Date: 05/12/2023  END OF SESSION:  PT End of Session - 05/12/23 1014     Visit Number 13    Number of Visits 17    Date for PT Re-Evaluation 06/02/23    Authorization Type [employer]    Progress Note Due on Visit 17    PT Start Time 1014    PT Stop Time 1050    PT Time Calculation (min) 36 min    Activity Tolerance Treatment limited secondary to medical complications (Comment)   lightheadedness   Behavior During Therapy [facility] for tasks assessed/performed               Past Medical History:  Diagnosis Date   Anemia    BV (bacterial vaginosis) 11/27/2012   Celiac disease    Cough due to ACE inhibitor 04/25/2019   Depression    Essential hypertension    Family history of adverse reaction to anesthesia    MOM-HARD TIME WAKING UP   GERD (gastroesophageal reflux disease)    Migraine with visual aura    MIGRAINES   UTI (lower urinary tract infection)    Past Surgical History:  Procedure Laterality Date   ABDOMINAL HYSTERECTOMY     BICEPT TENODESIS Left 01/27/2023   Procedure: BICEPS TENODESIS;  Surgeon: Cammy Copa, MD;  Location: [facility] OR;  Service: Orthopedics;  Laterality: Left;   BLADDER SUSPENSION     COLONOSCOPY WITH PROPOFOL N/A 05/11/2022   Procedure: COLONOSCOPY WITH PROPOFOL;  Surgeon: Wyline Mood, MD;  Location: [facility] ENDOSCOPY;  Service: Gastroenterology;  Laterality: N/A;   ESOPHAGOGASTRODUODENOSCOPY (EGD) WITH PROPOFOL N/A 05/11/2022   Procedure: ESOPHAGOGASTRODUODENOSCOPY (EGD) WITH PROPOFOL;  Surgeon: Wyline Mood, MD;  Location: [facility] ENDOSCOPY;  Service: Gastroenterology;  Laterality: N/A;   EXPLORATORY LAPAROTOMY     IUD REMOVAL  07/25/2017   Procedure: INTRAUTERINE DEVICE (IUD) REMOVAL;  Surgeon: Linzie Collin, MD;  Location: [facility] ORS;  Service: Gynecology;;   LAPAROSCOPIC ASSISTED VAGINAL HYSTERECTOMY Bilateral  07/25/2017   Procedure: LAPAROSCOPIC ASSISTED VAGINAL HYSTERECTOMY WITH BILATERAL SALPING OOPHERECTOMY;  Surgeon: Linzie Collin, MD;  Location: [facility] ORS;  Service: Gynecology;  Laterality: Bilateral;   SHOULDER ARTHROSCOPY WITH OPEN ROTATOR CUFF REPAIR AND DISTAL CLAVICLE ACROMINECTOMY Left 01/27/2023   Procedure: LEFT SHOULDER ARTHROSCOPY, DEBRIDEMENT, DISTAL CLAVICLE EXCISION, MINI OPEN ROTATOR CUFF TEAR REPAIR;  Surgeon: Cammy Copa, MD;  Location: [facility] OR;  Service: Orthopedics;  Laterality: Left;   TUBAL LIGATION     Patient Active Problem List   Diagnosis Date Noted   Vertigo 02/25/2023   Complete tear of left rotator cuff 02/08/2023   Biceps tendonitis, left 02/08/2023   Arthritis of left acromioclavicular joint 02/08/2023   Memory change 12/06/2022   Excessive daytime sleepiness 12/06/2022   Prediabetes 07/29/2022   Persistent cough for 3 weeks or longer 01/21/2022   Iron deficiency anemia 11/30/2021   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) 11/27/2021   Vitamin D deficiency 11/27/2021   Chronic fatigue 11/27/2021   Paranoid delusion (HCC) 11/27/2021   GERD (gastroesophageal reflux disease) 04/25/2019   Hyperlipidemia, mixed 03/02/2019   Benign essential HTN 02/18/2019   Recurrent cystitis 11/27/2012   Intractable migraine with status migrainosus 09/19/2011   Chronic constipation 06/25/2011    PCP: Doreene Nest, NP  REFERRING PROVIDER: Cammy Copa, MD  REFERRING DIAG:  Diagnosis  [phone] (ICD-10-CM) -  Complete tear of left rotator cuff, unspecified whether traumatic    THERAPY DIAG:  Abnormal posture  Muscle weakness (generalized)  Stiffness of left shoulder, not elsewhere classified  Chronic left shoulder pain  Localized edema  Rationale for Evaluation and Treatment: Rehabilitation  ONSET DATE: 01/27/2023 RTC repair  SUBJECTIVE:                                                                                                                                                                                       SUBJECTIVE STATEMENT: Shoulder is feeling pretty good, no specific complaints  Hand dominance: Right  PERTINENT HISTORY: HTN, migraines, left biceps tenodesis, 01/27/2023 Lt open RTC repair, vertigo, pre-diabetes, HLD  PAIN:  Are you having pain? No 0/10 now.  3-4/10 at worst over the past week.  PRECAUTIONS: Other: RTC repair  RED FLAGS: None   WEIGHT BEARING RESTRICTIONS:  Stay off the left shoulder  FALLS:  Has patient fallen in last 6 months? No  LIVING ENVIRONMENT: Lives with: lives with their family, lives with their spouse, and lives with an adult companion Lives in: House/apartment Stairs:  No issues Has following equipment at home: None  OCCUPATION: Works in a Personal assistant, works with machines, sits and stands  PLOF:  Was struggling before surgery  PATIENT GOALS: Be able to get back to work and normal function without pain or restriction   OBJECTIVE:  Note: Objective measures were completed at Evaluation unless otherwise noted.  DIAGNOSTIC FINDINGS: Pre-surgery: IMPRESSION: 1. Full-thickness tear of the supraspinatus anteriorly in the region of the myotendinous junction. 2. Mild to moderate rotator cuff tendinosis. 3. Focal tendinosis with probable interstitial tear of the long head biceps tendon in the groove entry zone. 4. Type 2 AC joint injury.  PATIENT SURVEYS:  EVAL: FOTO 32 (Goal 60 in 17 visit) 04/26/23: FOTO 47  COGNITION: Overall cognitive status: Within functional limits for tasks assessed     SENSATION: No complaints of peripheral pain or paresthesias  POSTURE: Significant for mild forward head, internally rotated and protracted shoulders  UPPER EXTREMITY ROM:   Passive ROM Left/Right 03/07/2023 Left 03/25/2023 Left 03/30/23 Left 04/01/2023 Left 04/08/2023 Left  04/19/23 Left 04/28/2023  Shoulder flexion 95/165  130 A: 137 (supine) 135 Deferred due to pain A: 117  (sitting) 135  Shoulder extension         Shoulder abduction      80  (sitting)   Shoulder horizontal adduction    40 35  35  Shoulder internal rotation 30/75 50  65 65  60  Shoulder external rotation 25/95 55  70 50 65  (sitting) 45  (Blank rows = not  tested)  UPPER EXTREMITY STRENGTH: Deferred secondary to being less than 6 weeks post rotator cuff repair at evaluation  In pounds with hand-held dynamometer Left/Right 03/07/2023 Left/Right 04/08/2023 Left/Right MMT 04/12/23 Left/Right MMT 04/15/23  Shoulder flexion      Shoulder extension      Shoulder abduction      Shoulder adduction      Shoulder internal rotation  14.7/29.1 5/5 21.3/30.4  Shoulder external rotation  8.7/18.1 4/4 10.0/19.0  (Blank rows = not tested)    TODAY'S TREATMENT:                                                                                                                                         DATE:  05/12/23 TherEx UBE L3 x 8 min (4 min each direction) Lt shoulder diagonals L2 band 2x10 BATCA rows 20# 3x10 Doorway ER stretch 3x30 sec on Lt Wall ladder with 10 sec hold at end range flexion x 3 reps - pt became lightheaded so sat; Coke provided.  BP 125/92 after sitting for 2 min; then tried to repeat exercise as pt was feeling better.  She because lightheaded again so exercise terminated.  Symptoms improved after sitting and having some Coke and Cheez-its but ended session due to repeat symptoms.  Pt reports she had not eaten today so recommended eating before next session.  05/05/23 UBE L2 4 min forward, 4 min backward Row machine 20# 2X10 Chest press machine 5 #2X10 Shoulder IR red 2X15 Shoulder ER red 2X15 Shoulder extensions red 2X15 Shoulder flexion AAROM 2# bar  X10 Shoulder abuction AAROM 2# bar X 10 Shoulder IR stretch 5 sec X 10 Shoulder ER stretch 5 sec X 10 at doorway Shoulder flexion stretch 5 sec X 10 at doorway -Vasopnuematic  device X 10 min, medium compression, 34 deg to Lt shoulder due to edema noted in left lateral shoulder    04/27/2022 Supine am raises/Scapular protraction 20 x 3 seconds with 5# Supine IR stretch 10 x 10 seconds Supine ER stretch 20 x 10 seconds Supine shoulder flexion with palm in and protract first (keep elbow in by the ear) 20 x 10 seconds Shoulder blade pinches/scapular retraction 10 x 5 seconds Thera-Band ER 2 sets of 10 with slow eccentrics Red Thera-Band IR 10 times with slow eccentrics Red  Functional Activities: Functional reaching and overhead testing revealed tightness with flexion and external rotation.  This was also seen with objective measures.  [name] and I spent the last 8 minutes of an appointment reviewing her updated home exercise program which is focused on only 5 exercises (scapular retraction, rotator cuff strengthening for internal and external rotation, capsular stretching into external rotation and flexion)   04/26/23 TherEx UBE L4 x 8 min (4 min each direction)  TherAct (Work simulated exercises) Horizontal adduction L1 band 3x15; small range due to pain at end range Horizontal abduction L1 band 3x15  Push/pull blankets across table 3x20 reps Pulling 2 blankets folded from Lt to mid with 1/4 turn x 20 reps Holding 2 blankets with short reach forward with bil UEs x20 reps Lifting 2 blankets from lower surface to higher surface x 20 reps (decreasing low  height every 4-5 reps)   PATIENT EDUCATION: Education details: See above Person educated: Patient Education method: Explanation, Demonstration, Tactile cues, Verbal cues, and Handouts Education comprehension: verbalized understanding, returned demonstration, verbal cues required, tactile cues required, and needs further education  HOME EXERCISE PROGRAM: Access Code: [email or web address] URL: [email or web address] Date: 04/28/2023 Prepared by: Pauletta Browns  Exercises - Standing Scapular Retraction  -  5 x daily - 7 x weekly - 1 sets - 5 reps - 5 second hold - Supine Scapular Protraction in Flexion with Dumbbells  - 2-3 x daily - 1 x weekly - 1 sets - 20 reps - 3 seconds hold - Supine Shoulder Internal Rotation Stretch  - 2-3 x daily - 1 x weekly - 1 sets - 10-20 reps - 10 seconds hold - Supine Shoulder External Rotation Stretch  - 2-3 x daily - 7 x weekly - 1 sets - 20 reps - 10 seconds hold - Supine Shoulder Flexion Extension Full Range AROM  - 2-3 x daily - 7 x weekly - 1 sets - 20 reps - 10 seconds hold - Sidelying Shoulder External Rotation Dumbbell  - 2 x daily - 1 x weekly - 10 reps - Seated Shoulder Flexion AAROM with Pulley Behind  - 2-3 x daily - 1 x weekly - 10 reps - 10 seconds hold - Shoulder External Rotation with Anchored Resistance  - 1 x daily - 7 x weekly - 2 sets - 10 reps - 3 seconds hold - Shoulder Internal Rotation with Resistance  - 1 x daily - 7 x weekly - 1 sets - 10 reps - 3 seconds hold  ASSESSMENT:  CLINICAL IMPRESSION: Session tolerated well initially, became lightheaded when trying to complete the wall ladder so terminated session.  Symptoms improved by time she left the gym and encouraged her to eat before coming to PT.  Continue skilled PT.    EVAL: Patient is a 58 y.o. female who was seen today for physical therapy evaluation and treatment for  Diagnosis  M75.122 (ICD-10-CM) - Complete tear of left rotator cuff, unspecified whether traumatic  .  Toshia will be 6 weeks post surgery on Thanksgiving day.  She has capsular tightness, pain and functional impairments consistent with being less than 6 weeks status post left rotator cuff repair.  She was set up on a comprehensive rehabilitation program with early emphasis on scapular strength, shoulder active range of motion and capsular flexibility.  We will increase appropriate resistive strengthening by week 10 post-surgery with expectations of 90% active range of motion and 60% strength at her 12-week post-surgery  anniversary.  OBJECTIVE IMPAIRMENTS: decreased activity tolerance, decreased endurance, decreased knowledge of condition, decreased ROM, decreased strength, decreased safety awareness, increased edema, impaired perceived functional ability, impaired flexibility, impaired UE functional use, postural dysfunction, and pain.   ACTIVITY LIMITATIONS: carrying, lifting, and reach over head  PARTICIPATION LIMITATIONS: community activity and occupation  PERSONAL FACTORS: HTN, migraines, left biceps tenodesis, 01/27/2023 Lt open RTC repair, vertigo, pre-diabetes, HLD are also affecting patient's functional outcome.   REHAB POTENTIAL: Good  CLINICAL DECISION MAKING: Stable/uncomplicated  EVALUATION COMPLEXITY: Low   GOALS: Goals reviewed with patient? Yes  SHORT TERM GOALS: Target date: 04/04/2023  Loreen will be independent  with her day 1 HEP  Baseline: Started 03/07/2023 Goal status: Met 03/25/2023  2.  Improve left shoulder passive range of motion for flexion to 150 degrees; ER to 70 degrees; IR to 50 degrees. Baseline: 95; 25 and 30 respectively Goal status: Partially met 04/28/2023   LONG TERM GOALS: Target date: 06/02/2023  Improve FOTO to 60 in 17 visits Baseline: 32 Goal status: On Going 04/26/23  2.  Improve left shoulder pain to consistently 0-3/10 on the Numeric Pain Rating Scale Baseline: 2-7/10 Goal status: Met 04/28/2023  3.  Improve left shoulder active range of motion to 90% of flexion 170 degrees; external rotation 90 degrees; internal rotation 70 degrees and horizontal adduction 40 degrees Baseline: See objective Goal status: On Going (76%) 04/28/2023  4.  Improve left shoulder strength for IR and ER to 60% or better as compared to the uninvolved right Baseline: Deferred at evaluation secondary to being less than 6 weeks postsurgery Goal status: Met 04/28/2023  5.  Atiyana will be independent with her long-term HEP at DC Baseline: Started 03/07/2023 Goal status:  On  Going 04/28/2023  PLAN:  PT FREQUENCY: 1 time a week  PT DURATION: 3 - 4 weeks  PLANNED INTERVENTIONS: 97110-Therapeutic exercises, 97530- Therapeutic activity, 97112- Neuromuscular re-education, 97535- Self Care, [id number]- Manual therapy, 97016- Vasopneumatic device, Patient/Family education, and Cryotherapy  PLAN FOR NEXT SESSION: monitor symptoms,  Progress back to work activities,  Heavy emphasis on external rotation and flexion active range of motion.  Continue appropriate scapular and rotator cuff strength activities, although limitations in external rotation and flexion active range of motion of the only things that have not met long-term goals.  NEXT MD VISIT: 05/23/23  Clarita Crane, PT, DPT 05/12/23 10:53 AM

## 2023-05-16 ENCOUNTER — Ambulatory Visit
Admission: RE | Admit: 2023-05-16 | Discharge: 2023-05-16 | Disposition: A | Discharge status: Home / Self Care | Payer: 59 | Source: Ambulatory Visit | Attending: Primary Care

## 2023-05-16 DIAGNOSIS — Z1231 Encounter for screening mammogram for malignant neoplasm of breast: Secondary | ICD-10-CM

## 2023-05-17 ENCOUNTER — Encounter: Payer: 59 | Admitting: Physical Therapy

## 2023-05-19 ENCOUNTER — Encounter: Payer: Self-pay | Admitting: Rehabilitative and Restorative Service Providers"

## 2023-05-19 ENCOUNTER — Ambulatory Visit: Payer: 59 | Admitting: Rehabilitative and Restorative Service Providers"

## 2023-05-19 DIAGNOSIS — M25512 Pain in left shoulder: Secondary | ICD-10-CM | POA: Diagnosis not present

## 2023-05-19 DIAGNOSIS — G8929 Other chronic pain: Secondary | ICD-10-CM

## 2023-05-19 DIAGNOSIS — M6281 Muscle weakness (generalized): Secondary | ICD-10-CM

## 2023-05-19 DIAGNOSIS — R293 Abnormal posture: Secondary | ICD-10-CM | POA: Diagnosis not present

## 2023-05-19 DIAGNOSIS — M25612 Stiffness of left shoulder, not elsewhere classified: Secondary | ICD-10-CM

## 2023-05-19 NOTE — Therapy (Signed)
 OUTPATIENT PHYSICAL THERAPY SHOULDER TREATMENT/PROGRESS NOTE NOTE  Progress Note Reporting Period 03/06/2024 to 05/19/2023  See note below for Objective Data and Assessment of Progress/Goals.    Patient Name: [name] MRN: [id number] DOB:[birth year], 58 y.o., female Today's Date: 05/19/2023  END OF SESSION:  PT End of Session - 05/19/23 1015     Visit Number 14    Number of Visits 17    Date for PT Re-Evaluation 06/30/23    Authorization Type [employer]    Progress Note Due on Visit 17    PT Start Time 1014    PT Stop Time 1055    PT Time Calculation (min) 41 min    Activity Tolerance Treatment limited secondary to medical complications (Comment)   lightheadedness   Behavior During Therapy [facility] for tasks assessed/performed             Past Medical History:  Diagnosis Date   Anemia    BV (bacterial vaginosis) 11/27/2012   Celiac disease    Cough due to ACE inhibitor 04/25/2019   Depression    Essential hypertension    Family history of adverse reaction to anesthesia    MOM-HARD TIME WAKING UP   GERD (gastroesophageal reflux disease)    Migraine with visual aura    MIGRAINES   UTI (lower urinary tract infection)    Past Surgical History:  Procedure Laterality Date   ABDOMINAL HYSTERECTOMY     BICEPT TENODESIS Left 01/27/2023   Procedure: BICEPS TENODESIS;  Surgeon: Addie Cordella Hamilton, MD;  Location: [facility] OR;  Service: Orthopedics;  Laterality: Left;   BLADDER SUSPENSION     COLONOSCOPY WITH PROPOFOL  N/A 05/11/2022   Procedure: COLONOSCOPY WITH PROPOFOL ;  Surgeon: Therisa Bi, MD;  Location: [facility] ENDOSCOPY;  Service: Gastroenterology;  Laterality: N/A;   ESOPHAGOGASTRODUODENOSCOPY (EGD) WITH PROPOFOL  N/A 05/11/2022   Procedure: ESOPHAGOGASTRODUODENOSCOPY (EGD) WITH PROPOFOL ;  Surgeon: Therisa Bi, MD;  Location: [name] ENDOSCOPY;  Service: Gastroenterology;  Laterality: N/A;   EXPLORATORY LAPAROTOMY     IUD REMOVAL  07/25/2017   Procedure: INTRAUTERINE DEVICE (IUD) REMOVAL;   Surgeon: Janit Alm Agent, MD;  Location: [facility] ORS;  Service: Gynecology;;   LAPAROSCOPIC ASSISTED VAGINAL HYSTERECTOMY Bilateral 07/25/2017   Procedure: LAPAROSCOPIC ASSISTED VAGINAL HYSTERECTOMY WITH BILATERAL SALPING OOPHERECTOMY;  Surgeon: Janit Alm Agent, MD;  Location: [facility] ORS;  Service: Gynecology;  Laterality: Bilateral;   SHOULDER ARTHROSCOPY WITH OPEN ROTATOR CUFF REPAIR AND DISTAL CLAVICLE ACROMINECTOMY Left 01/27/2023   Procedure: LEFT SHOULDER ARTHROSCOPY, DEBRIDEMENT, DISTAL CLAVICLE EXCISION, MINI OPEN ROTATOR CUFF TEAR REPAIR;  Surgeon: Addie Cordella Hamilton, MD;  Location: [facility] OR;  Service: Orthopedics;  Laterality: Left;   TUBAL LIGATION     Patient Active Problem List   Diagnosis Date Noted   Vertigo 02/25/2023   Complete tear of left rotator cuff 02/08/2023   Biceps tendonitis, left 02/08/2023   Arthritis of left acromioclavicular joint 02/08/2023   Memory change 12/06/2022   Excessive daytime sleepiness 12/06/2022   Prediabetes 07/29/2022   Persistent cough for 3 weeks or longer 01/21/2022   Iron  deficiency anemia 11/30/2021   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) 11/27/2021   Vitamin D  deficiency 11/27/2021   Chronic fatigue 11/27/2021   Paranoid delusion (HCC) 11/27/2021   GERD (gastroesophageal reflux disease) 04/25/2019   Hyperlipidemia, mixed 03/02/2019   Benign essential HTN 02/18/2019   Recurrent cystitis 11/27/2012   Intractable migraine with status migrainosus 09/19/2011   Chronic constipation 06/25/2011    PCP: Comer MARLA Gaskins, NP  REFERRING PROVIDER: Cordella Glendia Hutchinson, MD  REFERRING DIAG:  Diagnosis  [phone] (ICD-10-CM) - Complete tear of left rotator cuff, unspecified whether traumatic    THERAPY DIAG:  Abnormal posture - Plan: PT plan of care cert/re-cert  Muscle weakness (generalized) - Plan: PT plan of care cert/re-cert  Stiffness of left shoulder, not elsewhere classified - Plan: PT plan  of care cert/re-cert  Chronic left shoulder pain - Plan: PT plan of care cert/re-cert  Rationale for Evaluation and Treatment: Rehabilitation  ONSET DATE: 01/27/2023 RTC repair  SUBJECTIVE:                                                                                                                                                                                      SUBJECTIVE STATEMENT: Katheryn notes overall progress with her left shoulder AROM, strength, pain and function since starting PT.  Overhead AROM and ER are most limited and are the focus of continued work.  Hand dominance: Right  PERTINENT HISTORY: HTN, migraines, left biceps tenodesis, 01/27/2023 Lt open RTC repair, vertigo, pre-diabetes, HLD  PAIN:  Are you having pain? 0-4/10 this week (ibuprofen  1-2 x this week)  PRECAUTIONS: Other: RTC repair  RED FLAGS: None   WEIGHT BEARING RESTRICTIONS:  Stay off the left shoulder  FALLS:  Has patient fallen in last 6 months? No  LIVING ENVIRONMENT: Lives with: lives with their family, lives with their spouse, and lives with an adult companion Lives in: House/apartment Stairs:  No issues Has following equipment at home: None  OCCUPATION: Works in a Personal assistant, works with machines, sits and stands  PLOF:  Was struggling before surgery  PATIENT GOALS: Be able to get back to work and normal function without pain or restriction   OBJECTIVE:  Note: Objective measures were completed at Evaluation unless otherwise noted.  DIAGNOSTIC FINDINGS: Pre-surgery: IMPRESSION: 1. Full-thickness tear of the supraspinatus anteriorly in the region of the myotendinous junction. 2. Mild to moderate rotator cuff tendinosis. 3. Focal tendinosis with probable interstitial tear of the long head biceps tendon in the groove entry zone. 4. Type 2 AC joint injury.  PATIENT SURVEYS:  EVAL: FOTO 32 (Goal 60 in 17 visit) 04/26/23: FOTO 47 05/19/2023: FOTO 61  COGNITION: Overall  cognitive status: Within functional limits for tasks assessed     SENSATION: No complaints of peripheral pain or paresthesias  POSTURE: Significant for mild forward head, internally rotated and protracted shoulders  UPPER EXTREMITY ROM:   Passive ROM Left/Right 03/07/2023 Left 03/25/2023 Left 03/30/23 Left 04/01/2023 Left 04/08/2023 Left  04/19/23 Left 04/28/2023 Left 05/19/2023  Shoulder flexion 95/165 130 A: 137 (supine) 135 Deferred due to pain A: 117  (  sitting) 135 130  Shoulder extension          Shoulder abduction      80  (sitting)    Shoulder horizontal adduction    40 35  35 45  Shoulder internal rotation 30/75 50  65 65  60 65  Shoulder external rotation 25/95 55  70 50 65  (sitting) 45 70  (Blank rows = not tested)  UPPER EXTREMITY STRENGTH: Deferred secondary to being less than 6 weeks post rotator cuff repair at evaluation  In pounds with hand-held dynamometer Left/Right 03/07/2023 Left/Right 04/08/2023 Left/Right MMT 04/12/23 Left/Right 04/15/23 Left 05/19/2023  Shoulder flexion       Shoulder extension       Shoulder abduction       Shoulder adduction       Shoulder internal rotation  14.7/29.1 5/5 21.3/30.4 28.4  Shoulder external rotation  8.7/18.1 4/4 10.0/19.0 12.0  (Blank rows = not tested)    TODAY'S TREATMENT:                                                                                                                                         DATE:  05/19/2023 Therapeutic Exercises: Thera-Band IR 10X with slow eccentrics Red Thera-Band ER 2 sets of 10 with slow eccentrics Red  Functional Activities (for reaching): Supine ER functional reach 2 sets of 10 for 10 seconds Supine shoulder flexion 2 sets of 10 for 10 seconds with 1# Supine arm raises 20 x 3 seconds with 5# Progress note with objective measures, update and review HEP   05/12/23 TherEx UBE L3 x 8 min (4 min each direction) Lt shoulder diagonals L2 band 2x10 BATCA rows 20# 3x10 Doorway ER  stretch 3x30 sec on Lt Wall ladder with 10 sec hold at end range flexion x 3 reps - pt became lightheaded so sat; Coke provided.  BP 125/92 after sitting for 2 min; then tried to repeat exercise as pt was feeling better.  She because lightheaded again so exercise terminated.  Symptoms improved after sitting and having some Coke and Cheez-its but ended session due to repeat symptoms.  Pt reports she had not eaten today so recommended eating before next session.   05/05/23 UBE L2 4 min forward, 4 min backward Row machine 20# 2X10 Chest press machine 5 #2X10 Shoulder IR red 2X15 Shoulder ER red 2X15 Shoulder extensions red 2X15 Shoulder flexion AAROM 2# bar  X10 Shoulder abuction AAROM 2# bar X 10 Shoulder IR stretch 5 sec X 10 Shoulder ER stretch 5 sec X 10 at doorway Shoulder flexion stretch 5 sec X 10 at doorway -Vasopnuematic device X 10 min, medium compression, 34 deg to Lt shoulder due to edema noted in left lateral shoulder   PATIENT EDUCATION: Education details: See above Person educated: Patient Education method: Explanation, Demonstration, Tactile cues, Verbal cues, and Handouts Education comprehension: verbalized understanding, returned demonstration, verbal  cues required, tactile cues required, and needs further education  HOME EXERCISE PROGRAM: Access Code: [email or web address] URL: [email or web address] Date: 05/19/2023 Prepared by: Lamar Ivory  Exercises - Standing Scapular Retraction  - 5 x daily - 7 x weekly - 1 sets - 5 reps - 5 second hold - Supine Scapular Protraction in Flexion with Dumbbells  - 3 x daily - 7 x weekly - 1 sets - 20 reps - 3 seconds hold - Supine Shoulder Internal Rotation Stretch  - 2-3 x daily - 1 x weekly - 1 sets - 10-20 reps - 10 seconds hold - Supine Shoulder External Rotation Stretch  - 3 x daily - 7 x weekly - 1 sets - 20 reps - 10 seconds hold - Supine Shoulder Flexion Extension Full Range AROM  - 3 x daily - 7 x weekly - 1 sets - 20  reps - 10 seconds hold - Sidelying Shoulder External Rotation Dumbbell  - 2 x daily - 1 x weekly - 10 reps - Seated Shoulder Flexion AAROM with Pulley Behind  - 2-3 x daily - 1 x weekly - 10 reps - 10 seconds hold - Shoulder External Rotation with Anchored Resistance  - 2 x daily - 7 x weekly - 2 sets - 10 reps - 3 seconds hold - Shoulder Internal Rotation with Resistance  - 2 x daily - 7 x weekly - 1 sets - 10 reps - 3 seconds hold   ASSESSMENT:  CLINICAL IMPRESSION: Left shoulder strength is 68% of the uninvolved right (60-65% expected).  AROM is 86% of expected (170 flexion; 90 ER; 60 IR; 40 horizontal adduction expected).  Flexion AROM is furthest from what I would expect 3.5 months post-surgery with ER AROM mildly impaired.  Otherwise, Ilse is where I would expect her to be at this point.  Her HEP was updated today with emphasis on addressing remaining impairments, particularly flexion AROM.  With consistent work with her HEP, flexion should be approaching 170-180 degrees when she follows-up in a month.  I recommend [place] continue her current (updated today) HEP for a month independently before attending an AROM, strength and functional follow-up visit in a month.  I am recommending this given her (only) 130 degrees of left shoulder flexion as I expect this will be 150 degrees or greater in a month with consistent work at home.   EVAL: Patient is a 58 y.o. female who was seen today for physical therapy evaluation and treatment for  Diagnosis  M75.122 (ICD-10-CM) - Complete tear of left rotator cuff, unspecified whether traumatic  .  Paddy will be 6 weeks post surgery on Thanksgiving day.  She has capsular tightness, pain and functional impairments consistent with being less than 6 weeks status post left rotator cuff repair.  She was set up on a comprehensive rehabilitation program with early emphasis on scapular strength, shoulder active range of motion and capsular flexibility.  We will increase  appropriate resistive strengthening by week 10 post-surgery with expectations of 90% active range of motion and 60% strength at her 12-week post-surgery anniversary.  OBJECTIVE IMPAIRMENTS: decreased activity tolerance, decreased endurance, decreased knowledge of condition, decreased ROM, decreased strength, decreased safety awareness, increased edema, impaired perceived functional ability, impaired flexibility, impaired UE functional use, postural dysfunction, and pain.   ACTIVITY LIMITATIONS: carrying, lifting, and reach over head  PARTICIPATION LIMITATIONS: community activity and occupation  PERSONAL FACTORS: HTN, migraines, left biceps tenodesis, 01/27/2023 Lt open RTC repair, vertigo, pre-diabetes, HLD are also affecting patient's functional outcome.  REHAB POTENTIAL: Good  CLINICAL DECISION MAKING: Stable/uncomplicated  EVALUATION COMPLEXITY: Low   GOALS: Goals reviewed with patient? Yes  SHORT TERM GOALS: Target date: 04/04/2023  Jovani will be independent with her day 1 HEP  Baseline: Started 03/07/2023 Goal status: Met 03/25/2023  2.  Improve left shoulder passive range of motion for flexion to 150 degrees; ER to 70 degrees; IR to 50 degrees. Baseline: 95; 25 and 30 respectively Goal status: Partially met 2/62025   LONG TERM GOALS: Target date: 06/30/2023  Improve FOTO to 60 in 17 visits Baseline: 32 Goal status: Met 05/19/2023  2.  Improve left shoulder pain to consistently 0-3/10 on the Numeric Pain Rating Scale Baseline: 2-7/10 Goal status: Met 04/28/2023  3.  Improve left shoulder active range of motion to 90% of flexion 170 degrees; external rotation 90 degrees; internal rotation 70 degrees and horizontal adduction 40 degrees Baseline: See objective Goal status: On Going (86%) 05/19/2023  4.  Improve left shoulder strength for IR and ER to 60% or better as compared to the uninvolved right Baseline: Deferred at evaluation secondary to being less than 6 weeks  postsurgery Goal status: Met 04/28/2023  5.  Ziaire will be independent with her long-term HEP at DC Baseline: Started 03/07/2023 Goal status:  Met 05/19/2023  PLAN:  PT FREQUENCY: 1 time in 3 - 4 weeks  PT DURATION: 3 - 4 weeks  PLANNED INTERVENTIONS: 97110-Therapeutic exercises, 97530- Therapeutic activity, 97112- Neuromuscular re-education, 97535- Self Care, 02859- Manual therapy, 97016- Vasopneumatic device, Patient/Family education, and Cryotherapy  PLAN FOR NEXT SESSION: Check flexion and ER AROM, strength, function.  Hopefully DC with updated HEP for long-term management or make necessary adjustments to meet long-term goals.  NEXT MD VISIT: 05/23/23  Myer LELON Ivory PT, MPT 05/19/23 12:28 PM

## 2023-05-23 ENCOUNTER — Ambulatory Visit (INDEPENDENT_AMBULATORY_CARE_PROVIDER_SITE_OTHER): Payer: 59 | Admitting: Orthopedic Surgery

## 2023-05-23 ENCOUNTER — Encounter: Payer: Self-pay | Admitting: Orthopedic Surgery

## 2023-05-23 DIAGNOSIS — M75122 Complete rotator cuff tear or rupture of left shoulder, not specified as traumatic: Secondary | ICD-10-CM

## 2023-05-23 NOTE — Progress Notes (Addendum)
Post-Op Visit Note   Patient: [name]           Date of Birth: 06/03/1965           MRN: [id number] Visit Date: 05/23/2023 PCP: Doreene Nest, NP   Assessment & Plan:  Chief Complaint: No chief complaint on file.  Visit Diagnoses: No diagnosis found.  Plan: [name] is a 58 year old patient is now about 3 and half months out left shoulder rotator cuff tear repair of a myotendinous junction tear as well as biceps tenodesis.  She was doing some work this morning.  She is doing Runner, broadcasting/film/video at work.  Seeing physical therapist for final appointment on February 27.  Does have pain at times.  Still hard to sleep on that side because of some burning but overall she is making good progress with range of motion and strengthening.  On examination she has abduction to about 90 forward flexion to around 130 isolated glenohumeral and external rotation is about 40.  She has 5 out of 5 external rotation strength bilaterally.  Plan at this time is to continue with home exercises primarily for stretching with forward flexion and external rotation.  Taking ibuprofen as needed for symptoms.  She will follow-up with [name] as needed.  Cautioned her against doing lifting of heavy objects with straight arms out away from her body.  Follow-Up Instructions: No follow-ups on file.   Orders:  No orders of the defined types were placed in this encounter.  No orders of the defined types were placed in this encounter.   Imaging: No results found.  PMFS History: Patient Active Problem List   Diagnosis Date Noted   Vertigo 02/25/2023   Complete tear of left rotator cuff 02/08/2023   Biceps tendonitis, left 02/08/2023   Arthritis of left acromioclavicular joint 02/08/2023   Memory change 12/06/2022   Excessive daytime sleepiness 12/06/2022   Prediabetes 07/29/2022   Persistent cough for 3 weeks or longer 01/21/2022   Iron deficiency anemia 11/30/2021   Current severe episode of major depressive disorder with  psychotic features (HCC) 11/27/2021   Vitamin D deficiency 11/27/2021   Chronic fatigue 11/27/2021   Paranoid delusion (HCC) 11/27/2021   GERD (gastroesophageal reflux disease) 04/25/2019   Hyperlipidemia, mixed 03/02/2019   Benign essential HTN 02/18/2019   Recurrent cystitis 11/27/2012   Intractable migraine with status migrainosus 09/19/2011   Chronic constipation 06/25/2011   Past Medical History:  Diagnosis Date   Anemia    BV (bacterial vaginosis) 11/27/2012   Celiac disease    Cough due to ACE inhibitor 04/25/2019   Depression    Essential hypertension    Family history of adverse reaction to anesthesia    MOM-HARD TIME WAKING UP   GERD (gastroesophageal reflux disease)    Migraine with visual aura    MIGRAINES   UTI (lower urinary tract infection)     Family History  Problem Relation Age of Onset   Hypertension Mother    Stroke Mother    Cancer Father    Breast cancer Maternal Aunt    Hypertension Maternal Grandmother    Colon cancer Maternal Grandmother    Breast cancer Maternal Grandmother    Skin cancer Maternal Grandmother    Hypertension Maternal Grandfather    Hypertension Paternal Grandmother    Diabetes Paternal Grandmother    Hypertension Paternal Grandfather    Diabetes Son 12       type 1   Breast cancer Other     Past  Surgical History:  Procedure Laterality Date   ABDOMINAL HYSTERECTOMY     BICEPT TENODESIS Left 01/27/2023   Procedure: BICEPS TENODESIS;  Surgeon: Cammy Copa, MD;  Location: [facility] OR;  Service: Orthopedics;  Laterality: Left;   BLADDER SUSPENSION     COLONOSCOPY WITH PROPOFOL N/A 05/11/2022   Procedure: COLONOSCOPY WITH PROPOFOL;  Surgeon: Wyline Mood, MD;  Location: [facility] ENDOSCOPY;  Service: Gastroenterology;  Laterality: N/A;   ESOPHAGOGASTRODUODENOSCOPY (EGD) WITH PROPOFOL N/A 05/11/2022   Procedure: ESOPHAGOGASTRODUODENOSCOPY (EGD) WITH PROPOFOL;  Surgeon: Wyline Mood, MD;  Location: [facility] ENDOSCOPY;  Service:  Gastroenterology;  Laterality: N/A;   EXPLORATORY LAPAROTOMY     IUD REMOVAL  07/25/2017   Procedure: INTRAUTERINE DEVICE (IUD) REMOVAL;  Surgeon: Linzie Collin, MD;  Location: [facility] ORS;  Service: Gynecology;;   LAPAROSCOPIC ASSISTED VAGINAL HYSTERECTOMY Bilateral 07/25/2017   Procedure: LAPAROSCOPIC ASSISTED VAGINAL HYSTERECTOMY WITH BILATERAL SALPING OOPHERECTOMY;  Surgeon: Linzie Collin, MD;  Location: [facility] ORS;  Service: Gynecology;  Laterality: Bilateral;   SHOULDER ARTHROSCOPY WITH OPEN ROTATOR CUFF REPAIR AND DISTAL CLAVICLE ACROMINECTOMY Left 01/27/2023   Procedure: LEFT SHOULDER ARTHROSCOPY, DEBRIDEMENT, DISTAL CLAVICLE EXCISION, MINI OPEN ROTATOR CUFF TEAR REPAIR;  Surgeon: Cammy Copa, MD;  Location: [facility] OR;  Service: Orthopedics;  Laterality: Left;   TUBAL LIGATION     Social History   Occupational History   Occupation: Corporate treasurer  Tobacco Use   Smoking status: Never   Smokeless tobacco: Never  Vaping Use   Vaping status: Never Used  Substance and Sexual Activity   Alcohol use: No    Alcohol/week: 0.0 standard drinks of alcohol   Drug use: No   Sexual activity: Yes    Partners: Female    Birth control/protection: Surgical    Comment: INTERCOURSE AGE 64, SEXUAL PARTNERS LEES THAN 6

## 2023-05-31 ENCOUNTER — Encounter: Payer: 59 | Admitting: Primary Care

## 2023-06-09 ENCOUNTER — Encounter: Payer: 59 | Admitting: Rehabilitative and Restorative Service Providers"

## 2023-06-09 ENCOUNTER — Telehealth: Payer: Self-pay | Admitting: Rehabilitative and Restorative Service Providers"

## 2023-06-09 NOTE — Telephone Encounter (Signed)
 Called after cancel/no show within 24 hours about today's appointment.   Called and left message about not having any additional scheduled visits.  Left call back number.    Chyrel Masson, PT, DPT, OCS, ATC 06/09/23  9:50 AM

## 2023-06-09 NOTE — Therapy (Incomplete)
OUTPATIENT PHYSICAL THERAPY SHOULDER TREATMENT/PROGRESS NOTE NOTE  Progress Note Reporting Period 03/06/2024 to 05/19/2023  See note below for Objective Data and Assessment of Progress/Goals.    Patient Name: Anamarie Hunn MRN: [id number] DOB:10/27/1965, 58 y.o., female Today's Date: 06/09/2023  END OF SESSION:    Past Medical History:  Diagnosis Date   Anemia    BV (bacterial vaginosis) 11/27/2012   Celiac disease    Cough due to ACE inhibitor 04/25/2019   Depression    Essential hypertension    Family history of adverse reaction to anesthesia    MOM-HARD TIME WAKING UP   GERD (gastroesophageal reflux disease)    Migraine with visual aura    MIGRAINES   UTI (lower urinary tract infection)    Past Surgical History:  Procedure Laterality Date   ABDOMINAL HYSTERECTOMY     BICEPT TENODESIS Left 01/27/2023   Procedure: BICEPS TENODESIS;  Surgeon: Cammy Copa, MD;  Location: [facility] OR;  Service: Orthopedics;  Laterality: Left;   BLADDER SUSPENSION     COLONOSCOPY WITH PROPOFOL N/A 05/11/2022   Procedure: COLONOSCOPY WITH PROPOFOL;  Surgeon: Wyline Mood, MD;  Location: [facility] ENDOSCOPY;  Service: Gastroenterology;  Laterality: N/A;   ESOPHAGOGASTRODUODENOSCOPY (EGD) WITH PROPOFOL N/A 05/11/2022   Procedure: ESOPHAGOGASTRODUODENOSCOPY (EGD) WITH PROPOFOL;  Surgeon: Wyline Mood, MD;  Location: [facility] ENDOSCOPY;  Service: Gastroenterology;  Laterality: N/A;   EXPLORATORY LAPAROTOMY     IUD REMOVAL  07/25/2017   Procedure: INTRAUTERINE DEVICE (IUD) REMOVAL;  Surgeon: Linzie Collin, MD;  Location: [facility] ORS;  Service: Gynecology;;   LAPAROSCOPIC ASSISTED VAGINAL HYSTERECTOMY Bilateral 07/25/2017   Procedure: LAPAROSCOPIC ASSISTED VAGINAL HYSTERECTOMY WITH BILATERAL SALPING OOPHERECTOMY;  Surgeon: Linzie Collin, MD;  Location: [facility] ORS;  Service: Gynecology;  Laterality: Bilateral;   SHOULDER ARTHROSCOPY WITH OPEN ROTATOR CUFF REPAIR AND DISTAL CLAVICLE ACROMINECTOMY Left  01/27/2023   Procedure: LEFT SHOULDER ARTHROSCOPY, DEBRIDEMENT, DISTAL CLAVICLE EXCISION, MINI OPEN ROTATOR CUFF TEAR REPAIR;  Surgeon: Cammy Copa, MD;  Location: [facility] OR;  Service: Orthopedics;  Laterality: Left;   TUBAL LIGATION     Patient Active Problem List   Diagnosis Date Noted   Vertigo 02/25/2023   Complete tear of left rotator cuff 02/08/2023   Biceps tendonitis, left 02/08/2023   Arthritis of left acromioclavicular joint 02/08/2023   Memory change 12/06/2022   Excessive daytime sleepiness 12/06/2022   Prediabetes 07/29/2022   Persistent cough for 3 weeks or longer 01/21/2022   Iron deficiency anemia 11/30/2021   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  Rationale for Evaluation and Treatment: Rehabilitation  ONSET DATE: 01/27/2023 RTC repair  SUBJECTIVE:  SUBJECTIVE STATEMENT: Zyana notes overall progress with her left shoulder AROM, strength, pain and function since starting PT.  Overhead AROM and ER are most limited and are the focus of continued work.  Hand dominance: Right  PERTINENT HISTORY: HTN, migraines, left biceps tenodesis, 01/27/2023 Lt open RTC  repair, vertigo, pre-diabetes, HLD  PAIN:  Are you having pain? 0-4/10 this week (ibuprofen 1-2 x this week)  PRECAUTIONS: Other: RTC repair  RED FLAGS: None   WEIGHT BEARING RESTRICTIONS:  Stay off the left shoulder  FALLS:  Has patient fallen in last 6 months? No  LIVING ENVIRONMENT: Lives with: lives with their family, lives with their spouse, and lives with an adult companion Lives in: House/apartment Stairs:  No issues Has following equipment at home: None  OCCUPATION: Works in a Personal assistant, works with machines, sits and stands  PLOF:  Was struggling before surgery  PATIENT GOALS: Be able to get back to work and normal function without pain or restriction   OBJECTIVE:  Note: Objective measures were completed at Evaluation unless otherwise noted.  DIAGNOSTIC FINDINGS: Pre-surgery: IMPRESSION: 1. Full-thickness tear of the supraspinatus anteriorly in the region of the myotendinous junction. 2. Mild to moderate rotator cuff tendinosis. 3. Focal tendinosis with probable interstitial tear of the long head biceps tendon in the groove entry zone. 4. Type 2 AC joint injury.  PATIENT SURVEYS:  EVAL: FOTO 32 (Goal 60 in 17 visit) 04/26/23: FOTO 47 05/19/2023: FOTO 61  COGNITION: Overall cognitive status: Within functional limits for tasks assessed     SENSATION: No complaints of peripheral pain or paresthesias  POSTURE: Significant for mild forward head, internally rotated and protracted shoulders  UPPER EXTREMITY ROM:   Passive ROM Left/Right 03/07/2023 Left 03/25/2023 Left 03/30/23 Left 04/01/2023 Left 04/08/2023 Left  04/19/23 Left 04/28/2023 Left  05/19/2023 Left 06/09/2023  Shoulder flexion 95/165 130 A: 137 (supine) 135 Deferred due to pain A: 117  (sitting) 135 130   Shoulder extension           Shoulder abduction      80  (sitting)     Shoulder horizontal adduction    40 35  35 45   Shoulder internal rotation 30/75 50  65 65  60 65   Shoulder  external rotation 25/95 55  70 50 65  (sitting) 45 70   (Blank rows = not tested)  UPPER EXTREMITY STRENGTH: Deferred secondary to being less than 6 weeks post rotator cuff repair at evaluation  In pounds with hand-held dynamometer Left/Right 03/07/2023 Left/Right 04/08/2023 Left/Right MMT 04/12/23 Left/Right 04/15/23 Left 05/19/2023 Left 06/09/2023  Shoulder flexion        Shoulder extension        Shoulder abduction        Shoulder adduction        Shoulder internal rotation  14.7/29.1 5/5 21.3/30.4 28.4   Shoulder external rotation  8.7/18.1 4/4 10.0/19.0 12.0   (Blank rows = not tested)    TODAY'S TREATMENT:  DATE:  06/09/2023 Therex: Red tband IR x10 with slow eccentrics  Red tbannd ER 2x10 with slow eccentrics Red   Theract: Sleeper stretch 10x10sec Supine flexion 1lb 2x10 with 5-10sec hold  Sidelying abduction with 1lb 2x10 with 5-10sec hold    05/19/2023 Therapeutic Exercises: Thera-Band IR 10X with slow eccentrics Red Thera-Band ER 2 sets of 10 with slow eccentrics Red  Functional Activities (for reaching): Supine ER functional reach 2 sets of 10 for 10 seconds Supine shoulder flexion 2 sets of 10 for 10 seconds with 1# Supine arm raises 20 x 3 seconds with 5# Progress note with objective measures, update and review HEP   05/12/23 TherEx UBE L3 x 8 min (4 min each direction) Lt shoulder diagonals L2 band 2x10 BATCA rows 20# 3x10 Doorway ER stretch 3x30 sec on Lt Wall ladder with 10 sec hold at end range flexion x 3 reps - pt became lightheaded so sat; Coke provided.  BP 125/92 after sitting for 2 min; then tried to repeat exercise as pt was feeling better.  She because lightheaded again so exercise terminated.  Symptoms improved after sitting and having some Coke and Cheez-its but ended session due to repeat symptoms.  Pt reports she  had not eaten today so recommended eating before next session.   05/05/23 UBE L2 4 min forward, 4 min backward Row machine 20# 2X10 Chest press machine 5 #2X10 Shoulder IR red 2X15 Shoulder ER red 2X15 Shoulder extensions red 2X15 Shoulder flexion AAROM 2# bar  X10 Shoulder abuction AAROM 2# bar X 10 Shoulder IR stretch 5 sec X 10 Shoulder ER stretch 5 sec X 10 at doorway Shoulder flexion stretch 5 sec X 10 at doorway -Vasopnuematic device X 10 min, medium compression, 34 deg to Lt shoulder due to edema noted in left lateral shoulder   PATIENT EDUCATION: Education details: See above Person educated: Patient Education method: Explanation, Demonstration, Tactile cues, Verbal cues, and Handouts Education comprehension: verbalized understanding, returned demonstration, verbal cues required, tactile cues required, and needs further education  HOME EXERCISE PROGRAM: Access Code: [email or web address] URL: [email or web address] Date: 05/19/2023 Prepared by: Pauletta Browns  Exercises - Standing Scapular Retraction  - 5 x daily - 7 x weekly - 1 sets - 5 reps - 5 second hold - Supine Scapular Protraction in Flexion with Dumbbells  - 3 x daily - 7 x weekly - 1 sets - 20 reps - 3 seconds hold - Supine Shoulder Internal Rotation Stretch  - 2-3 x daily - 1 x weekly - 1 sets - 10-20 reps - 10 seconds hold - Supine Shoulder External Rotation Stretch  - 3 x daily - 7 x weekly - 1 sets - 20 reps - 10 seconds hold - Supine Shoulder Flexion Extension Full Range AROM  - 3 x daily - 7 x weekly - 1 sets - 20 reps - 10 seconds hold - Sidelying Shoulder External Rotation Dumbbell  - 2 x daily - 1 x weekly - 10 reps - Seated Shoulder Flexion AAROM with Pulley Behind  - 2-3 x daily - 1 x weekly - 10 reps - 10 seconds hold - Shoulder External Rotation with Anchored Resistance  - 2 x daily - 7 x weekly - 2 sets - 10 reps - 3 seconds hold - Shoulder Internal Rotation with Resistance  - 2 x daily - 7 x weekly  - 1 sets - 10 reps - 3 seconds hold   ASSESSMENT:  CLINICAL IMPRESSION: Left shoulder strength is 68% of the  uninvolved right (60-65% expected).  AROM is 86% of expected (170 flexion; 90 ER; 60 IR; 40 horizontal adduction expected).  Flexion AROM is furthest from what I would expect 3.5 months post-surgery with ER AROM mildly impaired.  Otherwise, Stacie is where I would expect her to be at this point.  Her HEP was updated today with emphasis on addressing remaining impairments, particularly flexion AROM.  With consistent work with her HEP, flexion should be approaching 170-180 degrees when she follows-up in a month.  I recommend Timika continue her current (updated today) HEP for a month independently before attending an AROM, strength and functional follow-up visit in a month.  I am recommending this given her (only) 130 degrees of left shoulder flexion as I expect this will be 150 degrees or greater in a month with consistent work at home.   EVAL: Patient is a 58 y.o. female who was seen today for physical therapy evaluation and treatment for  Diagnosis  M75.122 (ICD-10-CM) - Complete tear of left rotator cuff, unspecified whether traumatic  .  Milissa will be 6 weeks post surgery on Thanksgiving day.  She has capsular tightness, pain and functional impairments consistent with being less than 6 weeks status post left rotator cuff repair.  She was set up on a comprehensive rehabilitation program with early emphasis on scapular strength, shoulder active range of motion and capsular flexibility.  We will increase appropriate resistive strengthening by week 10 post-surgery with expectations of 90% active range of motion and 60% strength at her 12-week post-surgery anniversary.  OBJECTIVE IMPAIRMENTS: decreased activity tolerance, decreased endurance, decreased knowledge of condition, decreased ROM, decreased strength, decreased safety awareness, increased edema, impaired perceived functional ability, impaired  flexibility, impaired UE functional use, postural dysfunction, and pain.   ACTIVITY LIMITATIONS: carrying, lifting, and reach over head  PARTICIPATION LIMITATIONS: community activity and occupation  PERSONAL FACTORS: HTN, migraines, left biceps tenodesis, 01/27/2023 Lt open RTC repair, vertigo, pre-diabetes, HLD are also affecting patient's functional outcome.   REHAB POTENTIAL: Good  CLINICAL DECISION MAKING: Stable/uncomplicated  EVALUATION COMPLEXITY: Low   GOALS: Goals reviewed with patient? Yes  SHORT TERM GOALS: Target date: 04/04/2023  Shaylah will be independent with her day 1 HEP  Baseline: Started 03/07/2023 Goal status: Met 03/25/2023  2.  Improve left shoulder passive range of motion for flexion to 150 degrees; ER to 70 degrees; IR to 50 degrees. Baseline: 95; 25 and 30 respectively Goal status: Partially met 2/62025   LONG TERM GOALS: Target date: 06/30/2023  Improve FOTO to 60 in 17 visits Baseline: 32 Goal status: Met 05/19/2023  2.  Improve left shoulder pain to consistently 0-3/10 on the Numeric Pain Rating Scale Baseline: 2-7/10 Goal status: Met 04/28/2023  3.  Improve left shoulder active range of motion to 90% of flexion 170 degrees; external rotation 90 degrees; internal rotation 70 degrees and horizontal adduction 40 degrees Baseline: See objective Goal status: On Going (86%) 05/19/2023  4.  Improve left shoulder strength for IR and ER to 60% or better as compared to the uninvolved right Baseline: Deferred at evaluation secondary to being less than 6 weeks postsurgery Goal status: Met 04/28/2023  5.  Mariha will be independent with her long-term HEP at DC Baseline: Started 03/07/2023 Goal status:  Met 05/19/2023  PLAN:  PT FREQUENCY: 1 time in 3 - 4 weeks  PT DURATION: 3 - 4 weeks  PLANNED INTERVENTIONS: 97110-Therapeutic exercises, 97530- Therapeutic activity, [id number]- Neuromuscular re-education, 97535- Self Care, [id number]- Manual therapy, [id number]-  Vasopneumatic  device, Patient/Family education, and Cryotherapy  PLAN FOR NEXT SESSION: Check flexion and ER AROM, strength, function.  Hopefully DC with updated HEP for long-term management or make necessary adjustments to meet long-term goals.  NEXT MD VISIT: not scheduled   Jeanne Terrance, Brinly Maietta, Student-PT 06/09/2023, 8:27 AM

## 2023-06-30 ENCOUNTER — Encounter: Payer: Self-pay | Admitting: Internal Medicine

## 2023-06-30 ENCOUNTER — Inpatient Hospital Stay: Payer: 59

## 2023-06-30 ENCOUNTER — Inpatient Hospital Stay: Payer: 59 | Attending: Internal Medicine

## 2023-06-30 ENCOUNTER — Inpatient Hospital Stay: Payer: 59 | Admitting: Internal Medicine

## 2023-07-03 ENCOUNTER — Encounter: Payer: Self-pay | Admitting: Pulmonary Disease

## 2023-07-15 ENCOUNTER — Encounter: Payer: Self-pay | Admitting: Internal Medicine

## 2023-07-15 ENCOUNTER — Encounter: Payer: Self-pay | Admitting: Surgical

## 2023-07-15 ENCOUNTER — Other Ambulatory Visit (INDEPENDENT_AMBULATORY_CARE_PROVIDER_SITE_OTHER): Payer: Self-pay

## 2023-07-15 ENCOUNTER — Ambulatory Visit: Admitting: Surgical

## 2023-07-15 DIAGNOSIS — M25512 Pain in left shoulder: Secondary | ICD-10-CM

## 2023-07-15 DIAGNOSIS — G8929 Other chronic pain: Secondary | ICD-10-CM | POA: Diagnosis not present

## 2023-07-15 MED ORDER — ACETAMINOPHEN-CODEINE 300-30 MG PO TABS: 1.0000 | ORAL_TABLET | Freq: Every evening | ORAL | 0 refills | Status: DC | PRN

## 2023-07-15 NOTE — Progress Notes (Signed)
 Office Visit Note   Patient: [name]           Date of Birth: 1965-04-19           MRN: [id number] Visit Date: 07/15/2023 Requested by: Doreene Nest, NP [address] [place],  [place] [address] PCP: Doreene Nest, NP  Subjective: Chief Complaint  Patient presents with   Left Shoulder - Follow-up    01/27/23 left shoulder scope    HPI: [name] is a 58 y.o. female who presents to the office reporting left shoulder pain.  Has history of left shoulder arthroscopy with rotator cuff tear repair on 01/27/2023.  Had atypical rotator cuff tear of the supraspinatus at the musculotendinous junction with delaminated tear at the time of surgery.  She initially did well following procedure and returned to work on February 17 without any issue until the first week of March when she was getting dressed and felt a pop in her shoulder.  Ever since then she has had increased pain throughout the day and at night in particular.  It wakes her up from sleep at night.  It is progressively worsening over the last month without any improvement.  She has tried over-the-counter medications without relief.  She feels she has weakness in her arm and has new mechanical popping sensation in the last month as well since the incident.  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                ROS: All systems reviewed are negative as they relate to the chief complaint within the history of present illness.  Patient denies fevers or chills.  Assessment & Plan: Visit Diagnoses:  1. Chronic left shoulder pain     Plan: Impression is 58 year old female with recurrent left shoulder pain.  By her history with this popping incident and progressively worsening nighttime pain and activity based pain, concern for recurrent rotator cuff tear.  She had an  atypical tear with tear at the musculotendinous junction and initially was doing well after surgery.  Plan for MRI arthrogram of the left shoulder for further evaluation of recurrent rotator cuff tear.  Work note provided.  Prescribed Tylenol with codeine just to take at night to help with sleeping temporarily.  Follow-up after MRI to review results with Dr. August Saucer.  Follow-Up Instructions: No follow-ups on file.   Orders:  Orders Placed This Encounter  Procedures   XR Shoulder Left   No orders of the defined types were placed in this encounter.     Procedures: No procedures performed   Clinical Data: No additional findings.  Objective: Vital Signs: LMP  (LMP Unknown)   Physical Exam:  Constitutional: Patient appears well-developed HEENT:  Head: Normocephalic Eyes:EOM are normal Neck: Normal range of motion Cardiovascular: Normal rate Pulmonary/chest: Effort normal Neurologic: Patient is alert Skin: Skin is warm Psychiatric: Patient has normal mood and affect  Ortho Exam: Ortho exam demonstrates left shoulder with 40 degrees X rotation, 70 degrees abduction, 120 degrees forward elevation passively and actively.  This compared with the right shoulder with 75 degrees external rotation, 125 degrees abduction, 20 degrees forward elevation passively actively.  Excellent infraspinatus and subscapularis strength of the left shoulder with 4/5 supraspinatus weakness noted compared with contralateral side.  She has reproduced pain with any  passive motion of the shoulder and has fairly significant tenderness primarily in the anterior aspect of the shoulder diffusely.  There is no deformity noted.  There is some crepitus noted in the region of the rotator cuff with passive motion of the shoulder.  Negative external rotation lag sign.  No significant bicep flexion weakness but she does have 5 -/5 supination strength relative to 5/5 supination strength of the right arm.  Incisions are  well-healed.  Specialty Comments:  No specialty comments available.  Imaging: No results found.   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) 11/27/2021   Vitamin D deficiency 11/27/2021   Chronic fatigue 11/27/2021   Paranoid delusion (HCC) 11/27/2021   GERD (gastroesophageal reflux disease) 04/25/2019   Hyperlipidemia, mixed 03/02/2019   Benign essential HTN 02/18/2019   Recurrent cystitis 11/27/2012   Intractable migraine with status migrainosus 09/19/2011   Chronic constipation 06/25/2011   Past Medical History:  Diagnosis Date   Anemia    BV (bacterial vaginosis) 11/27/2012   Celiac disease    Cough due to ACE inhibitor 04/25/2019   Depression    Essential hypertension    Family history of adverse reaction to anesthesia    MOM-HARD TIME WAKING UP   GERD (gastroesophageal reflux disease)    Migraine with visual aura    MIGRAINES   UTI (lower urinary tract infection)     Family History  Problem Relation Age of Onset   Hypertension Mother    Stroke Mother    Cancer Father    Breast cancer Maternal Aunt    Hypertension Maternal Grandmother    Colon cancer Maternal Grandmother    Breast cancer Maternal Grandmother    Skin cancer Maternal Grandmother    Hypertension Maternal Grandfather    Hypertension Paternal Grandmother    Diabetes Paternal Grandmother    Hypertension Paternal Grandfather    Diabetes Son 12       type 1   Breast cancer Other     Past Surgical History:  Procedure Laterality Date   ABDOMINAL  HYSTERECTOMY     BICEPT TENODESIS Left 01/27/2023   Procedure: BICEPS TENODESIS;  Surgeon: Cammy Copa, MD;  Location: [facility] OR;  Service: Orthopedics;  Laterality: Left;   BLADDER SUSPENSION     COLONOSCOPY WITH PROPOFOL N/A 05/11/2022   Procedure: COLONOSCOPY WITH PROPOFOL;  Surgeon: Wyline Mood, MD;  [facility] ENDOSCOPY;  Service: Gastroenterology;  Laterality: N/A;   ESOPHAGOGASTRODUODENOSCOPY (EGD) WITH PROPOFOL N/A 05/11/2022   Procedure: ESOPHAGOGASTRODUODENOSCOPY (EGD) WITH PROPOFOL;  Surgeon: Wyline Mood, MD;  Location: [facility] ENDOSCOPY;  Service: Gastroenterology;  Laterality: N/A;   EXPLORATORY LAPAROTOMY     IUD REMOVAL  07/25/2017   Procedure: INTRAUTERINE DEVICE (IUD) REMOVAL;  Surgeon: Linzie Collin, MD;  Location: [facility] ORS;  Service: Gynecology;;   LAPAROSCOPIC ASSISTED VAGINAL HYSTERECTOMY Bilateral 07/25/2017   Procedure: LAPAROSCOPIC ASSISTED VAGINAL HYSTERECTOMY WITH BILATERAL SALPING OOPHERECTOMY;  Surgeon: Linzie Collin, MD;  Location: [facility] ORS;  Service: Gynecology;  Laterality: Bilateral;   SHOULDER ARTHROSCOPY WITH OPEN ROTATOR CUFF REPAIR AND DISTAL CLAVICLE  ACROMINECTOMY Left 01/27/2023   Procedure: LEFT SHOULDER ARTHROSCOPY, DEBRIDEMENT, DISTAL CLAVICLE EXCISION, MINI OPEN ROTATOR CUFF TEAR REPAIR;  Surgeon: Cammy Copa, MD;  Location: [facility] OR;  Service: Orthopedics;  Laterality: Left;   TUBAL LIGATION     Social History   Occupational History   Occupation: Corporate treasurer  Tobacco Use   Smoking status: Never   Smokeless tobacco: Never  Vaping Use   Vaping status: Never Used  Substance and Sexual Activity   Alcohol use: No    Alcohol/week: 0.0 standard drinks of alcohol   Drug use: No   Sexual activity: Yes    Partners: Female    Birth control/protection: Surgical    Comment: INTERCOURSE AGE 54, SEXUAL PARTNERS LEES THAN 6

## 2023-08-01 ENCOUNTER — Encounter (HOSPITAL_COMMUNITY): Payer: Self-pay | Admitting: *Deleted

## 2023-08-01 ENCOUNTER — Emergency Department (HOSPITAL_COMMUNITY)

## 2023-08-01 ENCOUNTER — Other Ambulatory Visit: Payer: Self-pay

## 2023-08-01 ENCOUNTER — Inpatient Hospital Stay (HOSPITAL_COMMUNITY)
Admission: EM | Admit: 2023-08-01 | Discharge: 2023-08-04 | DRG: 872 | Disposition: A | Discharge status: Home / Self Care | Attending: Student | Admitting: Student

## 2023-08-01 ENCOUNTER — Inpatient Hospital Stay (HOSPITAL_COMMUNITY)

## 2023-08-01 DIAGNOSIS — Z6832 Body mass index (BMI) 32.0-32.9, adult: Secondary | ICD-10-CM | POA: Diagnosis not present

## 2023-08-01 DIAGNOSIS — K900 Celiac disease: Secondary | ICD-10-CM | POA: Diagnosis present

## 2023-08-01 DIAGNOSIS — N390 Urinary tract infection, site not specified: Secondary | ICD-10-CM | POA: Diagnosis not present

## 2023-08-01 DIAGNOSIS — Z1152 Encounter for screening for COVID-19: Secondary | ICD-10-CM | POA: Diagnosis not present

## 2023-08-01 DIAGNOSIS — K5730 Diverticulosis of large intestine without perforation or abscess without bleeding: Secondary | ICD-10-CM | POA: Diagnosis present

## 2023-08-01 DIAGNOSIS — E66811 Obesity, class 1: Secondary | ICD-10-CM | POA: Diagnosis present

## 2023-08-01 DIAGNOSIS — D509 Iron deficiency anemia, unspecified: Secondary | ICD-10-CM | POA: Diagnosis present

## 2023-08-01 DIAGNOSIS — R9431 Abnormal electrocardiogram [ECG] [EKG]: Secondary | ICD-10-CM | POA: Diagnosis present

## 2023-08-01 DIAGNOSIS — F32A Depression, unspecified: Secondary | ICD-10-CM | POA: Diagnosis present

## 2023-08-01 DIAGNOSIS — K2100 Gastro-esophageal reflux disease with esophagitis, without bleeding: Secondary | ICD-10-CM | POA: Diagnosis present

## 2023-08-01 DIAGNOSIS — Z91018 Allergy to other foods: Secondary | ICD-10-CM

## 2023-08-01 DIAGNOSIS — R6520 Severe sepsis without septic shock: Secondary | ICD-10-CM | POA: Diagnosis present

## 2023-08-01 DIAGNOSIS — N3001 Acute cystitis with hematuria: Secondary | ICD-10-CM

## 2023-08-01 DIAGNOSIS — E872 Acidosis, unspecified: Secondary | ICD-10-CM | POA: Diagnosis present

## 2023-08-01 DIAGNOSIS — K449 Diaphragmatic hernia without obstruction or gangrene: Secondary | ICD-10-CM | POA: Diagnosis present

## 2023-08-01 DIAGNOSIS — A419 Sepsis, unspecified organism: Principal | ICD-10-CM | POA: Diagnosis present

## 2023-08-01 DIAGNOSIS — Z79899 Other long term (current) drug therapy: Secondary | ICD-10-CM

## 2023-08-01 DIAGNOSIS — E669 Obesity, unspecified: Secondary | ICD-10-CM | POA: Diagnosis not present

## 2023-08-01 DIAGNOSIS — N179 Acute kidney failure, unspecified: Secondary | ICD-10-CM | POA: Diagnosis present

## 2023-08-01 DIAGNOSIS — I10 Essential (primary) hypertension: Secondary | ICD-10-CM | POA: Diagnosis present

## 2023-08-01 DIAGNOSIS — Z791 Long term (current) use of non-steroidal anti-inflammatories (NSAID): Secondary | ICD-10-CM | POA: Diagnosis not present

## 2023-08-01 DIAGNOSIS — Z8249 Family history of ischemic heart disease and other diseases of the circulatory system: Secondary | ICD-10-CM

## 2023-08-01 DIAGNOSIS — Z888 Allergy status to other drugs, medicaments and biological substances status: Secondary | ICD-10-CM | POA: Diagnosis not present

## 2023-08-01 DIAGNOSIS — Z885 Allergy status to narcotic agent status: Secondary | ICD-10-CM

## 2023-08-01 DIAGNOSIS — E876 Hypokalemia: Secondary | ICD-10-CM | POA: Diagnosis present

## 2023-08-01 DIAGNOSIS — Z90710 Acquired absence of both cervix and uterus: Secondary | ICD-10-CM

## 2023-08-01 DIAGNOSIS — K219 Gastro-esophageal reflux disease without esophagitis: Secondary | ICD-10-CM

## 2023-08-01 DIAGNOSIS — G43909 Migraine, unspecified, not intractable, without status migrainosus: Secondary | ICD-10-CM | POA: Diagnosis present

## 2023-08-01 DIAGNOSIS — R531 Weakness: Secondary | ICD-10-CM | POA: Diagnosis present

## 2023-08-01 DIAGNOSIS — N12 Tubulo-interstitial nephritis, not specified as acute or chronic: Secondary | ICD-10-CM | POA: Diagnosis present

## 2023-08-01 LAB — HEMOGLOBIN AND HEMATOCRIT, BLOOD
HCT: 23.4 % — ABNORMAL LOW (ref 36.0–46.0)
Hemoglobin: 6.8 g/dL — CL (ref 12.0–15.0)

## 2023-08-01 LAB — COMPREHENSIVE METABOLIC PANEL WITH GFR
ALT: 25 U/L (ref 0–44)
AST: 37 U/L (ref 15–41)
Albumin: 2.8 g/dL — ABNORMAL LOW (ref 3.5–5.0)
Alkaline Phosphatase: 80 U/L (ref 38–126)
Anion gap: 12 (ref 5–15)
BUN: 17 mg/dL (ref 6–20)
CO2: 20 mmol/L — ABNORMAL LOW (ref 22–32)
Calcium: 8.4 mg/dL — ABNORMAL LOW (ref 8.9–10.3)
Chloride: 104 mmol/L (ref 98–111)
Creatinine, Ser: 1.03 mg/dL — ABNORMAL HIGH (ref 0.44–1.00)
GFR, Estimated: >60 mL/min (ref >60)
Glucose, Bld: 129 mg/dL — ABNORMAL HIGH (ref 70–99)
Potassium: 3.3 mmol/L — ABNORMAL LOW (ref 3.5–5.1)
Sodium: 136 mmol/L (ref 135–145)
Total Bilirubin: 0.6 mg/dL (ref 0.0–1.2)
Total Protein: 6.7 g/dL (ref 6.5–8.1)

## 2023-08-01 LAB — I-STAT CG4 LACTIC ACID, ED
Lactic Acid, Venous: 2.4 mmol/L (ref 0.5–1.9)
Lactic Acid, Venous: 0.9 mmol/L (ref 0.5–1.9)

## 2023-08-01 LAB — URINALYSIS, W/ REFLEX TO CULTURE (INFECTION SUSPECTED)
Bilirubin Urine: NEGATIVE
Glucose, UA: NEGATIVE mg/dL
Hgb urine dipstick: NEGATIVE
Ketones, ur: NEGATIVE mg/dL
Nitrite: NEGATIVE
Protein, ur: 100 mg/dL — AB
Specific Gravity, Urine: 1.017 (ref 1.005–1.030)
WBC, UA: >50 WBC/hpf (ref 0–5)
pH: 5 (ref 5.0–8.0)

## 2023-08-01 LAB — CBC WITH DIFFERENTIAL/PLATELET
Abs Immature Granulocytes: 0.04 10*3/uL (ref 0.00–0.07)
Basophils Absolute: 0 10*3/uL (ref 0.0–0.1)
Basophils Relative: 0 %
Eosinophils Absolute: 0 10*3/uL (ref 0.0–0.5)
Eosinophils Relative: 0 %
HCT: 27.8 % — ABNORMAL LOW (ref 36.0–46.0)
Hemoglobin: 7.8 g/dL — ABNORMAL LOW (ref 12.0–15.0)
Immature Granulocytes: 1 %
Lymphocytes Relative: 4 %
Lymphs Abs: 0.2 10*3/uL — ABNORMAL LOW (ref 0.7–4.0)
MCH: 22.7 pg — ABNORMAL LOW (ref 26.0–34.0)
MCHC: 28.1 g/dL — ABNORMAL LOW (ref 30.0–36.0)
MCV: 80.8 fL (ref 80.0–100.0)
Monocytes Absolute: 0.4 10*3/uL (ref 0.1–1.0)
Monocytes Relative: 6 %
Neutro Abs: 5.2 10*3/uL (ref 1.7–7.7)
Neutrophils Relative %: 89 %
Platelets: 186 10*3/uL (ref 150–400)
RBC: 3.44 MIL/uL — ABNORMAL LOW (ref 3.87–5.11)
RDW: 16.4 % — ABNORMAL HIGH (ref 11.5–15.5)
WBC: 5.9 10*3/uL (ref 4.0–10.5)
nRBC: 0 % (ref 0.0–0.2)

## 2023-08-01 LAB — IRON AND TIBC
Iron: 9 ug/dL — ABNORMAL LOW (ref 28–170)
Saturation Ratios: 3 % — ABNORMAL LOW (ref 10.4–31.8)
TIBC: 333 ug/dL (ref 250–450)
UIBC: 324 ug/dL

## 2023-08-01 LAB — RESP PANEL BY RT-PCR (RSV, FLU A&B, COVID)  RVPGX2
Influenza A by PCR: NEGATIVE
Influenza B by PCR: NEGATIVE
Resp Syncytial Virus by PCR: NEGATIVE
SARS Coronavirus 2 by RT PCR: NEGATIVE

## 2023-08-01 LAB — PROTIME-INR
INR: 1.2 (ref 0.8–1.2)
Prothrombin Time: 15.1 s (ref 11.4–15.2)

## 2023-08-01 LAB — FOLATE: Folate: 21.7 ng/mL (ref >5.9)

## 2023-08-01 LAB — RETICULOCYTES
Immature Retic Fract: 23.6 % — ABNORMAL HIGH (ref 2.3–15.9)
RBC.: 2.94 MIL/uL — ABNORMAL LOW (ref 3.87–5.11)
Retic Count, Absolute: 50.9 10*3/uL (ref 19.0–186.0)
Retic Ct Pct: 1.7 % (ref 0.4–3.1)

## 2023-08-01 LAB — FERRITIN: Ferritin: 31 ng/mL (ref 11–307)

## 2023-08-01 LAB — TSH: TSH: 1.609 u[IU]/mL (ref 0.350–4.500)

## 2023-08-01 LAB — VITAMIN B12: Vitamin B-12: 1031 pg/mL — ABNORMAL HIGH (ref 180–914)

## 2023-08-01 LAB — HIV ANTIBODY (ROUTINE TESTING W REFLEX): HIV Screen 4th Generation wRfx: NONREACTIVE

## 2023-08-01 LAB — OCCULT BLOOD X 1 CARD TO LAB, STOOL: Fecal Occult Bld: NEGATIVE

## 2023-08-01 MED ORDER — ACETAMINOPHEN 650 MG RE SUPP: 650.0000 mg | Freq: Four times a day (QID) | RECTAL | Status: DC | PRN

## 2023-08-01 MED ORDER — ALBUTEROL SULFATE (2.5 MG/3ML) 0.083% IN NEBU: 2.5000 mg | INHALATION_SOLUTION | Freq: Four times a day (QID) | RESPIRATORY_TRACT | Status: DC | PRN

## 2023-08-01 MED ORDER — SODIUM CHLORIDE 0.9 % IV SOLN
2.0000 g | Freq: Once | INTRAVENOUS | Status: AC
  Administered 2023-08-01: 2 g via INTRAVENOUS
  Filled 2023-08-01: qty 20

## 2023-08-01 MED ORDER — PROCHLORPERAZINE EDISYLATE 10 MG/2ML IJ SOLN
10.0000 mg | Freq: Four times a day (QID) | INTRAMUSCULAR | Status: DC | PRN
  Administered 2023-08-01 – 2023-08-04 (×4): 10 mg via INTRAVENOUS
  Filled 2023-08-01 (×4): qty 2

## 2023-08-01 MED ORDER — SODIUM CHLORIDE 0.9% FLUSH
3.0000 mL | Freq: Two times a day (BID) | INTRAVENOUS | Status: DC
  Administered 2023-08-01 – 2023-08-04 (×6): 3 mL via INTRAVENOUS

## 2023-08-01 MED ORDER — LACTATED RINGERS IV BOLUS
1000.0000 mL | Freq: Once | INTRAVENOUS | Status: AC
  Administered 2023-08-01: 1000 mL via INTRAVENOUS

## 2023-08-01 MED ORDER — SODIUM CHLORIDE 0.9 % IV SOLN
2.0000 g | INTRAVENOUS | Status: DC
  Administered 2023-08-02 – 2023-08-04 (×3): 2 g via INTRAVENOUS
  Filled 2023-08-01 (×3): qty 20

## 2023-08-01 MED ORDER — HYDROCODONE-ACETAMINOPHEN 5-325 MG PO TABS
1.0000 | ORAL_TABLET | ORAL | Status: DC | PRN
  Administered 2023-08-02: 1 via ORAL
  Filled 2023-08-01 (×2): qty 1

## 2023-08-01 MED ORDER — LACTATED RINGERS IV SOLN: INTRAVENOUS | Status: AC

## 2023-08-01 MED ORDER — ACETAMINOPHEN 325 MG PO TABS
650.0000 mg | ORAL_TABLET | Freq: Four times a day (QID) | ORAL | Status: DC | PRN
  Administered 2023-08-01 – 2023-08-04 (×7): 650 mg via ORAL
  Filled 2023-08-01 (×7): qty 2

## 2023-08-01 MED ORDER — LACTATED RINGERS IV BOLUS (SEPSIS)
250.0000 mL | Freq: Once | INTRAVENOUS | Status: AC
  Administered 2023-08-01: 250 mL via INTRAVENOUS

## 2023-08-01 MED ORDER — POTASSIUM CHLORIDE CRYS ER 20 MEQ PO TBCR
40.0000 meq | EXTENDED_RELEASE_TABLET | ORAL | Status: AC
  Administered 2023-08-01: 40 meq via ORAL
  Filled 2023-08-01: qty 2

## 2023-08-01 MED ORDER — POTASSIUM CHLORIDE CRYS ER 20 MEQ PO TBCR: 20.0000 meq | EXTENDED_RELEASE_TABLET | ORAL | Status: DC

## 2023-08-01 MED ORDER — LACTATED RINGERS IV BOLUS (SEPSIS)
1000.0000 mL | Freq: Once | INTRAVENOUS | Status: AC
  Administered 2023-08-01: 1000 mL via INTRAVENOUS

## 2023-08-01 MED ORDER — ACETAMINOPHEN 325 MG PO TABS
650.0000 mg | ORAL_TABLET | Freq: Once | ORAL | Status: AC
  Administered 2023-08-01: 650 mg via ORAL
  Filled 2023-08-01: qty 2

## 2023-08-01 MED ORDER — METOCLOPRAMIDE HCL 5 MG/ML IJ SOLN
5.0000 mg | Freq: Once | INTRAMUSCULAR | Status: AC
  Administered 2023-08-01: 5 mg via INTRAVENOUS
  Filled 2023-08-01: qty 2

## 2023-08-01 NOTE — ED Provider Notes (Signed)
 [place] EMERGENCY DEPARTMENT AT [facility] Provider Note   CSN: [id number] Arrival date & time: 08/01/23  0540     History  Chief Complaint  Patient presents with   Generalized Body Aches    [name] is a 58 y.o. female with a history of hypertension, GERD, and anemia presents the ED today for generalized bodyaches.  Patient reports feeling generalized bodyaches and fatigue for the past 5 days.  Endorses dark-colored urine and lower abdominal pain as well as low back pain.  No dysuria or increased urinary frequency. Developed nausea and vomiting yesterday.  Denies any changes to bowel habits.  No black stools or bright red blood in her stools.  No blood in the vomit.  Denies any recent sick contacts.  No additional complaints or concerns at this time.    Home Medications Prior to Admission medications   Medication Sig Start Date End Date Taking? Authorizing Provider  acetaminophen -codeine  (TYLENOL  #3) 300-30 MG tablet Take 1 tablet by mouth at bedtime as needed for moderate pain (pain score 4-6). 07/15/23   Magnant, Justice Olp, PA-C  atorvastatin  (LIPITOR) 20 MG tablet Take 20 mg by mouth every evening. For cholesterol. 12/06/22   Felicita Horns, FNP  celecoxib  (CELEBREX ) 100 MG capsule TAKE 1 CAPSULE BY MOUTH TWICE A DAY 02/23/23   Magnant, Charles L, PA-C  ferrous sulfate  325 (65 FE) MG EC tablet Take 325 mg by mouth 3 (three) times daily with meals.    [provider]  FLUoxetine  (PROZAC ) 20 MG capsule Take 20 mg by mouth daily. Takes with 40 mg to = 60 mg daily    [provider]  FLUoxetine  (PROZAC ) 40 MG capsule Take 1 capsule (40 mg total) by mouth daily. Take total of 60 mg daily. Take along with 20 mg cap 11/17/22 02/15/23  Todd Fossa, MD  losartan  (COZAAR ) 50 MG tablet TAKE 1 TABLET (50 MG TOTAL) BY MOUTH DAILY. FOR BLOOD PRESSURE. 06/21/22   Gabriel [name], NP  meclizine  (ANTIVERT ) 25 MG tablet Take 1 tablet (25 mg total) by mouth 3 (three)  times daily as needed for dizziness. 02/20/23   Fisher, Rufino Coulter, PA-C  methocarbamol  (ROBAXIN ) 500 MG tablet Take 1 tablet (500 mg total) by mouth every 8 (eight) hours as needed for muscle spasms. 03/18/23   Magnant, Charles L, PA-C  pantoprazole  (PROTONIX ) 40 MG tablet Take 1 tablet (40 mg total) by mouth 2 (two) times daily. For heartburn. 02/25/23   Clark, Katherine K, NP      Allergies    Topamax  [topiramate ], Tramadol , Gluten meal, and Ondansetron     Review of Systems   Review of Systems  Constitutional:  Positive for fatigue.  All other systems reviewed and are negative.   Physical Exam Updated Vital Signs BP (!) 105/58   Pulse 80   Temp 99.2 F (37.3 C) (Oral)   Resp (!) 27   Ht 5' (1.524 m)   Wt 69.4 kg   LMP  (LMP Unknown)   SpO2 99%   BMI 29.88 kg/m  Physical Exam Vitals and nursing note reviewed. Exam conducted with a chaperone present.  Constitutional:      Appearance: Normal appearance.  HENT:     Head: Normocephalic and atraumatic.     Mouth/Throat:     Mouth: Mucous membranes are moist.  Eyes:     Conjunctiva/sclera: Conjunctivae normal.     Pupils: Pupils are equal, round, and reactive to light.  Cardiovascular:  Rate and Rhythm: Regular rhythm. Tachycardia present.     Pulses: Normal pulses.     Heart sounds: Normal heart sounds.  Pulmonary:     Effort: Pulmonary effort is normal.     Breath sounds: Normal breath sounds.  Abdominal:     Palpations: Abdomen is soft.     Tenderness: There is abdominal tenderness.     Comments: Periumbilical tenderness to palpation without guarding or rebound.  Genitourinary:    Rectum: Normal. Guaiac result negative.  Musculoskeletal:        General: Tenderness present. Normal range of motion.     Cervical back: Normal range of motion. No tenderness.     Comments: Tenderness to the right lower flank to palpation.  No bony tenderness.  Range of motion, strength, and sensation of upper and lower extremities intact  bilaterally.  Skin:    General: Skin is warm and dry.     Findings: No rash.  Neurological:     General: No focal deficit present.     Mental Status: She is alert.     Sensory: No sensory deficit.     Motor: No weakness.  Psychiatric:        Mood and Affect: Mood normal.        Behavior: Behavior normal.    ED Results / Procedures / Treatments   Labs (all labs ordered are listed, but only abnormal results are displayed) Labs Reviewed  COMPREHENSIVE METABOLIC PANEL WITH GFR - Abnormal; Notable for the following components:      Result Value   Potassium 3.3 (*)    CO2 20 (*)    Glucose, Bld 129 (*)    Creatinine, Ser 1.03 (*)    Calcium  8.4 (*)    Albumin  2.8 (*)    All other components within normal limits  CBC WITH DIFFERENTIAL/PLATELET - Abnormal; Notable for the following components:   RBC 3.44 (*)    Hemoglobin 7.8 (*)    HCT 27.8 (*)    MCH 22.7 (*)    MCHC 28.1 (*)    RDW 16.4 (*)    Lymphs Abs 0.2 (*)    All other components within normal limits  URINALYSIS, W/ REFLEX TO CULTURE (INFECTION SUSPECTED) - Abnormal; Notable for the following components:   Color, Urine AMBER (*)    APPearance CLOUDY (*)    Protein, ur 100 (*)    Leukocytes,Ua LARGE (*)    Bacteria, UA MANY (*)    All other components within normal limits  I-STAT CG4 LACTIC ACID, ED - Abnormal; Notable for the following components:   Lactic Acid, Venous 2.4 (*)    All other components within normal limits  RESP PANEL BY RT-PCR (RSV, FLU A&B, COVID)  RVPGX2  CULTURE, BLOOD (ROUTINE X 2)  CULTURE, BLOOD (ROUTINE X 2)  PROTIME-INR  OCCULT BLOOD X 1 CARD TO LAB, STOOL  HIV ANTIBODY (ROUTINE TESTING W REFLEX)  TSH  I-STAT CG4 LACTIC ACID, ED    EKG EKG Interpretation Date/Time:  Monday August 01 2023 07:17:13 EDT Ventricular Rate:  92 PR Interval:  154 QRS Duration:  97 QT Interval:  400 QTC Calculation: 495 R Axis:   47  Text Interpretation: Sinus rhythm Borderline T wave abnormalities  Borderline prolonged QT interval Confirmed by Russella Courts ([id number]) on 08/01/2023 7:43:24 AM  Radiology DG Chest Port 1 View Result Date: 08/01/2023 CLINICAL DATA:  Fatigue, vomiting, questionable sepsis EXAM: PORTABLE CHEST 1 VIEW COMPARISON:  08/23/2022 FINDINGS: Single frontal view of  the chest demonstrates a stable cardiac silhouette. No acute airspace disease, effusion, or pneumothorax. Stable hiatal hernia. No acute bony abnormalities. IMPRESSION: 1. No acute intrathoracic process. Electronically Signed   By: Bobbye Burrow M.D.   On: 08/01/2023 10:44    Procedures .Critical Care  Performed by: Sonnie Dusky, PA-C Authorized by: Sonnie Dusky, PA-C   Critical care provider statement:    Critical care time (minutes):  32   Critical care was necessary to treat or prevent imminent or life-threatening deterioration of the following conditions:  Sepsis   Critical care was time spent personally by me on the following activities:  Blood draw for specimens, development of treatment plan with patient or surrogate, discussions with consultants, evaluation of patient's response to treatment, examination of patient, obtaining history from patient or surrogate, review of old charts, re-evaluation of patient's condition, pulse oximetry, ordering and review of radiographic studies, ordering and review of laboratory studies and ordering and performing treatments and interventions   Care discussed with: admitting provider       Medications Ordered in ED Medications  lactated ringers  infusion ( Intravenous New Bag/Given 08/01/23 1122)  sodium chloride  flush (NS) 0.9 % injection 3 mL (3 mLs Intravenous Given 08/01/23 1122)  acetaminophen  (TYLENOL ) tablet 650 mg (has no administration in time range)    Or  acetaminophen  (TYLENOL ) suppository 650 mg (has no administration in time range)  albuterol  (PROVENTIL ) (2.5 MG/3ML) 0.083% nebulizer solution 2.5 mg (has no administration in time range)   HYDROcodone -acetaminophen  (NORCO/VICODIN) 5-325 MG per tablet 1 tablet (has no administration in time range)  cefTRIAXone  (ROCEPHIN ) 2 g in sodium chloride  0.9 % 100 mL IVPB (has no administration in time range)  metoCLOPramide  (REGLAN ) injection 5 mg (5 mg Intravenous Given 08/01/23 0659)  lactated ringers  bolus 1,000 mL (1,000 mLs Intravenous New Bag/Given 08/01/23 0701)  acetaminophen  (TYLENOL ) tablet 650 mg (650 mg Oral Given 08/01/23 0754)  lactated ringers  bolus 1,000 mL (0 mLs Intravenous Stopped 08/01/23 1000)    And  lactated ringers  bolus 250 mL (0 mLs Intravenous Stopped 08/01/23 1118)  cefTRIAXone  (ROCEPHIN ) 2 g in sodium chloride  0.9 % 100 mL IVPB (0 g Intravenous Stopped 08/01/23 5573)    ED Course/ Medical Decision Making/ A&P                                 Medical Decision Making Amount and/or Complexity of Data Reviewed Labs: ordered.  Risk OTC drugs. Prescription drug management. Decision regarding hospitalization.   This patient presents to the ED for concern of fatigue with nausea, this involves an extensive number of treatment options, and is a complaint that carries with it a high risk of complications and morbidity.   Differential diagnosis includes: Flu, COVID, RSV, other viral illness, pneumonia, UTI, pyelonephritis, sepsis, etc.   Comorbidities  See HPI above   Additional History  Additional history obtained from prior records   Cardiac Monitoring / EKG  The patient was maintained on a cardiac monitor.  I personally viewed and interpreted the cardiac monitored which showed: sinus rhythm with borderline T wave abnormalities and borderline prolonged QT interval with a heart rate of 92 bpm.   Lab Tests  I ordered and personally interpreted labs.  The pertinent results include:   Initial lactic acid of 2.4, repeat of 0.9. Blood cultures pending. Negative respiratory panel Hemoglobin of 7.8, otherwise CBC is reassuring CMP is within normal  limits UA shows large  leukocytes and many bacteria.  Culture pending. Negative FBOT   Imaging Studies  I ordered imaging studies including CXR  I independently visualized and interpreted imaging which showed:  No acute cardiopulmonary process. I agree with the radiologist interpretation   Consultations  I requested consultation with Dr. Felipe Horton with [facility],  and discussed lab and imaging findings as well as pertinent plan - they recommend: admission for further work up and management.   Problem List / ED Course / Critical Interventions / Medication Management  Patient reports generalized weakness for the past 5 days.  States that she has been having some dark-colored urine periumbilical tenderness and diffuse low back pain.  She started vomiting yesterday but denies dysuria or diarrhea.  No dark or tarry stools or bright red blood per rectum.  No blood in the vomit. I ordered medications including: Reglan  for nausea LR for elevated HR Tylenol  for fever  Ceftriaxone  for UTI Reevaluation of the patient after these medicines showed that the patient improved.  Temperature came down to 99.2 F, tachypnea and tachycardia also improved. I have reviewed the patients home medicines and have made adjustments as needed   Social Determinants of Health  Depression    Test / Admission - Considered  Discussed findings with patient. She is agreeable with plan for admission.       Final Clinical Impression(s) / ED Diagnoses Final diagnoses:  Sepsis, due to unspecified organism, unspecified whether acute organ dysfunction present [facility])  Acute cystitis with hematuria    Rx / DC Orders ED Discharge Orders     None         Sonnie Dusky, PA-C 08/01/23 1204    Russella Courts A, DO 08/02/23 [phone]

## 2023-08-01 NOTE — H&P (Addendum)
 History and Physical    Patient: [name] OZH:[id number] DOB: 08/13/1965 DOA: 08/01/2023 DOS: the patient was seen and examined on 08/01/2023 PCP: Gabriel [name], NP  Patient coming from: Home  Chief Complaint:  Chief Complaint  Patient presents with   Generalized Body Aches   HPI: [name] is a 58 y.o. female with medical history significant of weakness and chills.  She has been experiencing weakness and chills since last Thursday(4 days ago), which have progressively worsened, prompting her to call 911. No known fever she has not formally checked. She had nausea and vomiting, which occurred this morning before calling EMS. No stomach pain, but still feels nauseated. No black or dark stools.  She has back pain, initially on both sides associated with chills, but later more localized to the left side. She attributed the pain to lying down for extended periods of time with her symptoms.  No issues with urination, such as discomfort or pain, and no changes in her urine.  She has been taking Excedrin Migraine once a day for migraine relief and over-the-counter Prilosec. Additionally, she started taking over-the-counter iron  pills once a day for the past two days due to a history of needing iron  infusions in the past due to her fatigue.  Upon admission into the emergency department patient was noted to be febrile up to 103.3 F with tachycardia and tachypnea meeting sepsis criteria.  All other vital signs were noted to be stable.  Labs significant for WBC 5.9, hemoglobin 7.8, potassium 3.3, BUN 17, creatinine 1.03, and lactic acid 2.4.  Urinalysis was significant for large leukocytes, many bacteria, 6-10 squamous epithelial cells/hpf, and greater than 50 WBCs.  Fecal occult negative.  Blood and urine cultures have been obtained.  Patient was given acetaminophen  650 mg p.o., 2.25 L of lactated Ringer 's, and Rocephin  IV.  Review of Systems: As mentioned in the history of present  illness. All other systems reviewed and are negative. Past Medical History:  Diagnosis Date   Anemia    BV (bacterial vaginosis) 11/27/2012   Celiac disease    Cough due to ACE inhibitor 04/25/2019   Depression    Essential hypertension    Family history of adverse reaction to anesthesia    MOM-HARD TIME WAKING UP   GERD (gastroesophageal reflux disease)    Migraine with visual aura    MIGRAINES   UTI (lower urinary tract infection)    Past Surgical History:  Procedure Laterality Date   ABDOMINAL HYSTERECTOMY     BICEPT TENODESIS Left 01/27/2023   Procedure: BICEPS TENODESIS;  Surgeon: Jasmine Mesi, MD;  Location: [facility] OR;  Service: Orthopedics;  Laterality: Left;   BLADDER SUSPENSION     COLONOSCOPY WITH PROPOFOL  N/A 05/11/2022   Procedure: COLONOSCOPY WITH PROPOFOL ;  Surgeon: [name] Salaam, MD;  Location[facility] END[facility]COPY;  Service: Gastroenterology;  Laterality: N/A;   ESOPHAGOGASTRODUODENOSCOPY (EGD) WITH PROPOFOL  N/A 05/11/2022   Procedure: ESOPHAGOGASTRODUODENOSCOPY (EGD) WITH PROPOFOL ;  Surgeon: [name] Salaam, MD;  Location: [facility] ENDOSCOPY;  Service: Gastroenterology;  Laterality: N/A;   EXPLORATORY LAPAROTOMY     IUD REMOVAL  07/25/2017   Procedure: INTRAUTERINE DEVICE (IUD) REMOVAL;  Surgeon: Zenobia Hila, MD;  Location: [facility] ORS;  Service: Gynecology;;   LAPAROSCOPIC ASSISTED VAGINAL HYSTERECTOMY Bilateral 07/25/2017   Procedure: LAPAROSCOPIC ASSISTED VAGINAL HYSTERECTOMY WITH BILATERAL SALPING OOPHERECTOMY;  Surgeon: Zenobia Hila, MD;  Location: [facility] ORS;  Service: Gynecology;  Laterality: Bilateral;   SHOULDER ARTHROSCOPY WITH OPEN ROTATOR CUFF REPAIR AND DISTAL CLAVICLE ACROMINECTOMY  Left 01/27/2023   Procedure: LEFT SHOULDER ARTHROSCOPY, DEBRIDEMENT, DISTAL CLAVICLE EXCISION, MINI OPEN ROTATOR CUFF TEAR REPAIR;  Surgeon: Jasmine Mesi, MD;  Location: [facility] OR;  Service: Orthopedics;  Laterality: Left;   TUBAL LIGATION     Social History:  reports that she  has never smoked. She has never used smokeless tobacco. She reports that she does not drink alcohol and does not use drugs.  Allergies  Allergen Reactions   Topamax  [Topiramate ]     "cant function"    Tramadol      Cant function with it    Gluten Meal Diarrhea and Nausea Only        Ondansetron  Other (See Comments)    Helps her nausea, but makes HA worse    Family History  Problem Relation Age of Onset   Hypertension Mother    Stroke Mother    Cancer Father    Breast cancer Maternal Aunt    Hypertension Maternal Grandmother    Colon cancer Maternal Grandmother    Breast cancer Maternal Grandmother    Skin cancer Maternal Grandmother    Hypertension Maternal Grandfather    Hypertension Paternal Grandmother    Diabetes Paternal Grandmother    Hypertension Paternal Grandfather    Diabetes Son 12       type 1   Breast cancer Other     Prior to Admission medications   Medication Sig Start Date End Date Taking? Authorizing Provider  acetaminophen -codeine  (TYLENOL  #3) 300-30 MG tablet Take 1 tablet by mouth at bedtime as needed for moderate pain (pain score 4-6). 07/15/23   Magnant, Charles L, PA-C  atorvastatin  (LIPITOR) 20 MG tablet Take 20 mg by mouth every evening. For cholesterol. 12/06/22   Felicita Horns, FNP  celecoxib  (CELEBREX ) 100 MG capsule TAKE 1 CAPSULE BY MOUTH TWICE A DAY 02/23/23   Magnant, Justice Olp, PA-C  ferrous sulfate  325 (65 FE) MG EC tablet Take 325 mg by mouth 3 (three) times daily with meals.    [provider]  FLUoxetine  (PROZAC ) 20 MG capsule Take 20 mg by mouth daily. Takes with 40 mg to = 60 mg daily    [provider]  FLUoxetine  (PROZAC ) 40 MG capsule Take 1 capsule (40 mg total) by mouth daily. Take total of 60 mg daily. Take along with 20 mg cap 11/17/22 02/15/23  Todd Fossa, MD  losartan  (COZAAR ) 50 MG tablet TAKE 1 TABLET (50 MG TOTAL) BY MOUTH DAILY. FOR BLOOD PRESSURE. 06/21/22   Clark, Katherine K, NP  meclizine  (ANTIVERT ) 25 MG  tablet Take 1 tablet (25 mg total) by mouth 3 (three) times daily as needed for dizziness. 02/20/23   Fisher, Rufino Coulter, PA-C  methocarbamol  (ROBAXIN ) 500 MG tablet Take 1 tablet (500 mg total) by mouth every 8 (eight) hours as needed for muscle spasms. 03/18/23   Magnant, Charles L, PA-C  pantoprazole  (PROTONIX ) 40 MG tablet Take 1 tablet (40 mg total) by mouth 2 (two) times daily. For heartburn. 02/25/23   Gabriel [name], NP    Physical Exam: Vitals:   08/01/23 0730 08/01/23 0845 08/01/23 0900 08/01/23 0945  BP: (!) 120/55 (!) 107/59 106/60 108/66  Pulse: 95 97 96 93  Resp: (!) 26 (!) 29 (!) 30 (!) 21  Temp:      TempSrc:      SpO2: 100% 98% 98% 100%  Weight:      Height:         Constitutional: NAD, calm, comfortable Eyes: PERRL, lids and  conjunctivae normal ENMT: Mucous membranes are moist. Posterior pharynx clear of any exudate or lesions.Normal dentition.  Neck: normal, supple, no masses, no thyromegaly Respiratory: clear to auscultation bilaterally, no wheezing, no crackles. Normal respiratory effort. No accessory muscle use.  Cardiovascular: Regular rate and rhythm, no murmurs / rubs / gallops. No extremity edema. 2+ pedal pulses. No carotid bruits.  Abdomen: no tenderness, no masses palpated. No hepatosplenomegaly. Bowel sounds positive.  Musculoskeletal: no clubbing / cyanosis. No joint deformity upper and lower extremities. Good ROM, no contractures. Normal muscle tone.  Skin: no rashes, lesions, ulcers. No induration Neurologic: CN 2-12 grossly intact. Sensation intact, DTR normal. Strength 5/5 in all 4.  Psychiatric: Normal judgment and insight. Alert and oriented x 3. Normal mood.   Data Reviewed:  reviewed labs, imaging, and pertinent records as documented.  Assessment and Plan:  Sepsis secondary to UTI/pyelonephritis Patient presented with fever up to 103.3 F with tachycardia and tachypnea meeting SIRS criteria.  On physical exam patient with lower abdominal   and left flank tenderness to palpation.  Urinalysis significant for large leukocytes, many bacteria, 6-10 squamous epithelial cells/hpf, and greater than 50 WBCs.  Initial lactic acid was elevated 2.4, but resolved after initial treatment.  Blood cultures were obtained.  Patient was bolused IV fluids and started on empiric antibiotics of Rocephin .  Suspecting sepsis secondary to urinary tract infection/pyelonephritis. - Admit to a telemetry bed - Follow-up blood and urine cultures - Check renal CT - Continue empiric antibiotics Rocephin  IV - Tylenol  as needed for fever  Acute kidney injury On admission creatinine noted to be 1.03 with BUN 17.  Baseline creatinine previously noted to be around 0.63-0.72.  Suspect acute kidney injury related sepsis in the setting of urinary tract infection.- Continue IV fluids - Recheck kidney function in a.m.  Hypokalemia Acute.  Initial potassium noted to be 3.3. - Give potassium chloride  40 meq p.o. - Continue to monitor and replace as needed  Iron  deficiency anemia Acute on chronic.  Hemoglobin noted to be 7.8 with MCV 80.8 and MCH 22.47.  Hemoglobin had been 8.7 when checked back in 02/2023.  Stool guaiacs were noted to be negative.  Patient reports that she had started taking iron  supplements yesterday due to an history of iron  deficiency requiring iron  infusions previously in the past.  Records note patient had endoscopy 04/2022 with Dr. Antony Baumgartner which revealed grade C esophagitis and large hiatal hernia.  Colonoscopy during that same time revealed diverticulosis throughout the entire colon. - Type and screen for possible need of blood products - Check anemia panel - Will place orders for iron  infusion/blood transfusion if deemed medically appropriate  Obesity BMI 32.89 kg/m   DVT prophylaxis: SCDs Advance Care Planning:   Code Status: Full Code    Consults: None  Family Communication: None  Severity of Illness: The appropriate patient status for  this patient is INPATIENT. Inpatient status is judged to be reasonable and necessary in order to provide the required intensity of service to ensure the patient's safety. The patient's presenting symptoms, physical exam findings, and initial radiographic and laboratory data in the context of their chronic comorbidities is felt to place them at high risk for further clinical deterioration. Furthermore, it is not anticipated that the patient will be medically stable for discharge from the hospital within 2 midnights of admission.   * I certify that at the point of admission it is my clinical judgment that the patient will require inpatient hospital care spanning beyond 2  midnights from the point of admission due to high intensity of service, high risk for further deterioration and high frequency of surveillance required.*  Author: Lena Qualia, MD 08/01/2023 10:53 AM  For on call review [email or web address].

## 2023-08-01 NOTE — Sepsis Progress Note (Signed)
 Elink will follow per sepsis protocol.

## 2023-08-01 NOTE — ED Triage Notes (Signed)
 Presents to ed via [facility] states she hasn't felt well since Thurs/co fatigue and states her urine is dark in color hasn't been drinking. C/o vomiting onset yest. C/o lower back pain , patient was given 500 cc ns per ems.

## 2023-08-01 NOTE — Plan of Care (Signed)

## 2023-08-02 DIAGNOSIS — N390 Urinary tract infection, site not specified: Secondary | ICD-10-CM | POA: Diagnosis not present

## 2023-08-02 DIAGNOSIS — A419 Sepsis, unspecified organism: Secondary | ICD-10-CM | POA: Diagnosis not present

## 2023-08-02 LAB — CBC
HCT: 22.7 % — ABNORMAL LOW (ref 36.0–46.0)
Hemoglobin: 6.5 g/dL — CL (ref 12.0–15.0)
MCH: 22.6 pg — ABNORMAL LOW (ref 26.0–34.0)
MCHC: 28.6 g/dL — ABNORMAL LOW (ref 30.0–36.0)
MCV: 78.8 fL — ABNORMAL LOW (ref 80.0–100.0)
Platelets: 183 10*3/uL (ref 150–400)
RBC: 2.88 MIL/uL — ABNORMAL LOW (ref 3.87–5.11)
RDW: 16.5 % — ABNORMAL HIGH (ref 11.5–15.5)
WBC: 4.8 10*3/uL (ref 4.0–10.5)
nRBC: 0 % (ref 0.0–0.2)

## 2023-08-02 LAB — BASIC METABOLIC PANEL WITH GFR
Anion gap: 6 (ref 5–15)
BUN: 11 mg/dL (ref 6–20)
CO2: 23 mmol/L (ref 22–32)
Calcium: 8.2 mg/dL — ABNORMAL LOW (ref 8.9–10.3)
Chloride: 107 mmol/L (ref 98–111)
Creatinine, Ser: 0.88 mg/dL (ref 0.44–1.00)
GFR, Estimated: >60 mL/min (ref >60)
Glucose, Bld: 104 mg/dL — ABNORMAL HIGH (ref 70–99)
Potassium: 3.6 mmol/L (ref 3.5–5.1)
Sodium: 136 mmol/L (ref 135–145)

## 2023-08-02 LAB — HEMOGLOBIN AND HEMATOCRIT, BLOOD
HCT: 25.6 % — ABNORMAL LOW (ref 36.0–46.0)
Hemoglobin: 7.8 g/dL — ABNORMAL LOW (ref 12.0–15.0)

## 2023-08-02 LAB — PREPARE RBC (CROSSMATCH)

## 2023-08-02 MED ORDER — SODIUM CHLORIDE 0.9% IV SOLUTION: Freq: Once | INTRAVENOUS | Status: AC

## 2023-08-02 MED ORDER — PANTOPRAZOLE SODIUM 40 MG PO TBEC
40.0000 mg | DELAYED_RELEASE_TABLET | Freq: Every day | ORAL | Status: DC
  Administered 2023-08-02 – 2023-08-04 (×3): 40 mg via ORAL
  Filled 2023-08-02 (×3): qty 1

## 2023-08-02 NOTE — Hospital Course (Signed)
 [name] is a 58 y.o. female with past medical history of anemia, depression, hypertension, GERD, migraine presented to hospital with worsening weakness and chills for 4 days with some nausea and vomiting and back pain/flank pain..  No difficulty with urinating.  Had been having migraine headache and was taking Excedrin at home.  In the ED patient was febrile with a temperature of 103.3 F with tachycardia and tachypnea.  WBC was 5.9.  Potassium low at 3.3.  Lactate elevated at 2.4.  Urinalysis with large white cells.  Blood culture and urine cultures were sent from the ED and patient was out of the hospital for further evaluation and treatment.    Sepsis secondary to UTI/pyelonephritis Patient presented with fever up to 103.3 F with tachycardia and tachypnea with UTI, elevated lactate meeting criteria for sepsis.    Urinalysis significant for large leukocytes, many bacteria, 6-10 squamous epithelial cells/hpf, and greater than 50 WBCs.  Follow-up blood cultures urine cultures.  Pending cultures.  Continue IV Rocephin .  Acute kidney injury On admission creatinine was 1.0.  Creatinine today at 0.8 baseline around 0.6-0.7.  Received IV fluids.  Will continue to monitor.   Hypokalemia Initial potassium of 3.3.  Has improved to 3.6 after replacement.  Will continue to monitor.   Iron  deficiency anemia Acute on chronic.  Hemoglobin noted to be 7.8 with MCV 80.8 and MCH 22.47 presentation.  Has down trended to 6.5..  Hemoglobin had been 8.7 when checked back in 02/2023.  Fecal occult blood was negative.  Had not been taking iron  until yesterday and history of iron  infusion in the past.  Patient with history of endoscopy 04/2022 with Dr. Antony Baumgartner which revealed grade C esophagitis and large hiatal hernia.  Colonoscopy during that same time revealed diverticulosis throughout the entire colon.  Will transfuse 1 unit of packed RBC today.  Class I obesity BMI 32.89 kg/m.  Would benefit from lifestyle  modifications and weight loss.

## 2023-08-02 NOTE — Progress Notes (Signed)
   08/02/23 0125  Complaints & Interventions  Complains of Other (Comment) (None)  Provider Notification  Provider Name/Title Dr. Farrel Hones  Date Provider Notified 08/02/23  Time Provider Notified 0120  Method of Notification Page  Notification Reason Other (Comment) (Hgb 6.8)  Provider response No new orders  Date of Provider Response 08/02/23  Time of Provider Response [phone]

## 2023-08-02 NOTE — TOC CM/SW Note (Signed)
 Transition of Care [facility]) - Inpatient Brief Assessment   Patient Details  Name: [name] MRN: [id number] Date of Birth: 12/06/1965  Transition of Care [facility]) CM/SW Contact:    Tom-Johnson, Angelique Ken, RN Phone Number: 08/02/2023, 3:17 PM   Clinical Narrative:  Patient presented to the ED with worsening Fatigue, dark color Urine, Flank Pain, Fever and Chills. Admitted with Sepsis 2/2 UTI. On IV abx. Patient's Hgb at 6.5, 1U PRBC given.   From home with husband and legally blind son, has three children. Currently employed, independent with care and drive self prior to admit. Does not have DME's at home.  PCP is Gabriel [name], NP and uses [facility] in [place].   No TOC needs or recommendations noted at this time.  Patient not Medically ready for discharge.  CM will continue to follow as patient progresses with care towards discharge.               Transition of Care Asessment: Insurance and Status: Insurance coverage has been reviewed Patient has primary care physician: Yes Home environment has been reviewed: Yes Prior level of function:: Independent Prior/Current Home Services: No current home services Social Drivers of Health Review: SDOH reviewed no interventions necessary Readmission risk has been reviewed: Yes Transition of care needs: no transition of care needs at this time

## 2023-08-02 NOTE — Progress Notes (Addendum)
 PROGRESS NOTE  [name] ZOX:[id number] DOB: Feb 08, 1966 DOA: 08/01/2023 PCP: Gabriel [name], NP   LOS: 1 day   Brief narrative:  [name] is a 58 y.o. female with past medical history of anemia, depression, hypertension, GERD, migraine presented to hospital with worsening weakness and chills for 4 days with some nausea and vomiting and back pain/flank pain..  No difficulty with urinating.  Had been having migraine headache and was taking Excedrin at home.  In the ED patient was febrile with a temperature of 103.3 F with tachycardia and tachypnea.  WBC was 5.9.  Potassium low at 3.3.  Lactate elevated at 2.4.  Urinalysis with large white cells.  Blood culture and urine cultures were sent from the ED and patient was out of the hospital for further evaluation and treatment.      Assessment/Plan: Principal Problem:   Sepsis secondary to UTI [facility]) Active Problems:   AKI (acute kidney injury) (HCC)   Hypokalemia   Iron  deficiency anemia   Obesity (BMI 30-39.9)  Sepsis secondary to UTI/pyelonephritis Patient presented with fever up to 103.3 F with tachycardia and tachypnea with UTI, elevated lactate meeting criteria for sepsis.    Urinalysis significant for large leukocytes, many bacteria, 6-10 squamous epithelial cells/hpf, and greater than 50 WBCs.  Follow-up blood cultures urine cultures.  Pending cultures.  Continue IV Rocephin .  Acute kidney injury On admission creatinine was 1.0.  Creatinine today at 0.8 baseline around 0.6-0.7.  Received IV fluids.  Will continue to monitor.   Hypokalemia Initial potassium of 3.3.  Has improved to 3.6 after replacement.  Will continue to monitor.   Iron  deficiency anemia Acute on chronic.  Hemoglobin noted to be 7.8 with MCV 80.8 and MCH 22.47 presentation.  Has down trended to 6.5..  Hemoglobin had been 8.7 when checked back in 02/2023.  Fecal occult blood was negative.  Had not been taking iron  until yesterday and history of iron  infusion  in the past.  Patient with history of endoscopy 04/2022 with Dr. Antony Baumgartner which revealed grade C esophagitis and large hiatal hernia.  Colonoscopy during that same time revealed diverticulosis throughout the entire colon.  Will transfuse 1 unit of packed RBC today.  Spoke with the patient about his and she is okay with that.  Folic acid  at 21 vitamin B-12 10131.  Iron  of 9 with ferritin of 31.  Class I obesity BMI 32.89 kg/m.  Would benefit from lifestyle modifications and weight loss.  DVT prophylaxis: SCDs Start: 08/01/23 1104   Disposition: Home likely in 1 to 2 days  Status is: Inpatient Remains inpatient appropriate because: Pending clinical improvement, IV antibiotics, pending cultures,    Code Status:     Code Status: Full Code  Family Communication: None at bedside  Consultants: None  Procedures: PRBC transfusion 1 unit  Anti-infectives:  Rocephin  IV  Anti-infectives (From admission, onward)    Start     Dose/Rate Route Frequency Ordered Stop   08/02/23 0930  cefTRIAXone  (ROCEPHIN ) 2 g in sodium chloride  0.9 % 100 mL IVPB        2 g 200 mL/hr over 30 Minutes Intravenous Every 24 hours 08/01/23 1107     08/01/23 0930  cefTRIAXone  (ROCEPHIN ) 2 g in sodium chloride  0.9 % 100 mL IVPB        2 g 200 mL/hr over 30 Minutes Intravenous Once 08/01/23 0917 08/01/23 0958        Subjective: Today, patient was seen and examined at bedside.  Patient  states that she feels a little better but feels very diaphoretic without any obvious nausea or vomiting or abdominal pain.  Denies any shortness of breath dyspnea.  Has been having frequent urination.  Objective: Vitals:   08/02/23 0503 08/02/23 0745  BP: 112/63 (!) 105/58  Pulse: 85   Resp: 20 18  Temp: (!) 100.9 F (38.3 C) (!) 101.7 F (38.7 C)  SpO2: 98% 94%    Intake/Output Summary (Last 24 hours) at 08/02/2023 1032 Last data filed at 08/02/2023 0515 Gross per 24 hour  Intake 2350.46 ml  Output 1400 ml  Net 950.46  ml   Filed Weights   08/01/23 0548 08/01/23 1307  Weight: 69.4 kg 76.4 kg   Body mass index is 32.89 kg/m.   Physical Exam: GENERAL: Patient is alert awake and oriented. Not in obvious distress.  Obese built HENT: Pallor noted pupils equally reactive to light. Oral mucosa is moist NECK: is supple, no gross swelling noted. CHEST: Clear to auscultation. No crackles or wheezes.  Diminished breath sounds bilaterally. CVS: S1 and S2 heard, no murmur. Regular rate and rhythm.  ABDOMEN: Soft, non-tender, bowel sounds are present.  No costovertebral angle tenderness on palpation EXTREMITIES: No edema. CNS: Cranial nerves are intact. No focal motor deficits. SKIN: warm and dry without rashes.  Diffuse diaphoresis  Data Review: I have personally reviewed the following laboratory data and studies,  CBC: Recent Labs  Lab 08/01/23 0658 08/01/23 2201 08/02/23 0451  WBC 5.9  --  4.8  NEUTROABS 5.2  --   --   HGB 7.8* 6.8* 6.5*  HCT 27.8* 23.4* 22.7*  MCV 80.8  --  78.8*  PLT 186  --  183   Basic Metabolic Panel: Recent Labs  Lab 08/01/23 0658 08/02/23 0451  NA 136 136  K 3.3* 3.6  CL 104 107  CO2 20* 23  GLUCOSE 129* 104*  BUN 17 11  CREATININE 1.03* 0.88  CALCIUM  8.4* 8.2*   Liver Function Tests: Recent Labs  Lab 08/01/23 0658  AST 37  ALT 25  ALKPHOS 80  BILITOT 0.6  PROT 6.7  ALBUMIN  2.8*   No results for input(s): "LIPASE", "AMYLASE" in the last 168 hours. No results for input(s): "AMMONIA" in the last 168 hours. Cardiac Enzymes: No results for input(s): "CKTOTAL", "CKMB", "CKMBINDEX", "TROPONINI" in the last 168 hours. BNP (last 3 results) No results for input(s): "BNP" in the last 8760 hours.  ProBNP (last 3 results) No results for input(s): "PROBNP" in the last 8760 hours.  CBG: No results for input(s): "GLUCAP" in the last 168 hours. Recent Results (from the past 240 hours)  Blood Culture (routine x 2)     Status: None (Preliminary result)    Collection Time: 08/01/23  6:36 AM   Specimen: BLOOD  Result Value Ref Range Status   Specimen Description BLOOD SITE NOT SPECIFIED  Final   Special Requests   Final    BOTTLES DRAWN AEROBIC AND ANAEROBIC Blood Culture results may not be optimal due to an inadequate volume of blood received in culture bottles   Culture   Final    NO GROWTH 1 DAY Performed at [facility], [address], [place], [place] [address]    Report Status PENDING  Incomplete  Blood Culture (routine x 2)     Status: None (Preliminary result)   Collection Time: 08/01/23  6:58 AM   Specimen: BLOOD  Result Value Ref Range Status   Specimen Description BLOOD BLOOD LEFT  HAND AEROBIC BOTTLE ONLY  Final   Special Requests   Final    BOTTLES DRAWN AEROBIC ONLY Blood Culture results may not be optimal due to an inadequate volume of blood received in culture bottles   Culture   Final    NO GROWTH 1 DAY Performed at [facility], [address] [place], [place] [address]    Report Status PENDING  Incomplete  Resp panel by RT-PCR (RSV, Flu A&B, Covid) Anterior Nasal Swab     Status: None   Collection Time: 08/01/23  7:48 AM   Specimen: Anterior Nasal Swab  Result Value Ref Range Status   SARS Coronavirus 2 by RT PCR NEGATIVE NEGATIVE Final   Influenza A by PCR NEGATIVE NEGATIVE Final   Influenza B by PCR NEGATIVE NEGATIVE Final    Comment: (NOTE) The Xpert Xpress SARS-CoV-2/FLU/RSV plus assay is intended as an aid in the diagnosis of influenza from Nasopharyngeal swab specimens and should not be used as a sole basis for treatment. Nasal washings and aspirates are unacceptable for Xpert Xpress SARS-CoV-2/FLU/RSV testing.  Fact Sheet for Patients: BloggerCourse.com  Fact Sheet for Healthcare Providers: SeriousBroker.it  This test is not yet approved or cleared by the [place]  FDA and has been authorized for detection and/or diagnosis of  SARS-CoV-2 by FDA under an Emergency Use Authorization (EUA). This EUA will remain in effect (meaning this test can be used) for the duration of the COVID-19 declaration under Section 564(b)(1) of the Act, 21 U.S.C. section 360bbb-3(b)(1), unless the authorization is terminated or revoked.     Resp Syncytial Virus by PCR NEGATIVE NEGATIVE Final    Comment: (NOTE) Fact Sheet for Patients: BloggerCourse.com  Fact Sheet for Healthcare Providers: SeriousBroker.it  This test is not yet approved or cleared by the [place]  FDA and has been authorized for detection and/or diagnosis of SARS-CoV-2 by FDA under an Emergency Use Authorization (EUA). This EUA will remain in effect (meaning this test can be used) for the duration of the COVID-19 declaration under Section 564(b)(1) of the Act, 21 U.S.C. section 360bbb-3(b)(1), unless the authorization is terminated or revoked.  Performed at [facility], [address] [place], [place] [address]      Studies: CT RENAL STONE STUDY Result Date: 08/01/2023 CLINICAL DATA:  Flank pain EXAM: CT ABDOMEN AND PELVIS WITHOUT CONTRAST TECHNIQUE: Multidetector CT imaging of the abdomen and pelvis was performed following the standard protocol without IV contrast. RADIATION DOSE REDUCTION: This exam was performed according to the departmental dose-optimization program which includes automated exposure control, adjustment of the mA and/or kV according to patient size and/or use of iterative reconstruction technique. COMPARISON:  None Available. FINDINGS: Lower chest: Lung bases demonstrate no acute airspace disease. Cardiomegaly. Moderate hiatal hernia Hepatobiliary: No focal liver abnormality is seen. No gallstones, gallbladder wall thickening, or biliary dilatation. Pancreas: Unremarkable. No pancreatic ductal dilatation or surrounding inflammatory changes. Spleen: Normal in size without focal  abnormality. Adrenals/Urinary Tract: Adrenal glands are normal. Nonspecific perinephric fat stranding. Multiple bilateral small kidney stones. Minimal bilateral hydronephrosis, no obstructing ureteral stone. Bladder is normal Stomach/Bowel: Stomach nonenlarged. No dilated small bowel. Diverticular disease of the colon without acute wall thickening. Vascular/Lymphatic: Aortic atherosclerosis. No enlarged abdominal or pelvic lymph nodes. Reproductive: Status post hysterectomy. No adnexal masses. Other: Negative for pelvic effusion or free air Musculoskeletal: No acute or suspicious osseous abnormality IMPRESSION: 1. Multiple bilateral small kidney stones. Minimal bilateral hydronephrosis, no obstructing ureteral stone. 2. Left greater than right perinephric  fat stranding, nonspecific but correlate for signs of upper urinary tract infection. 3. Diverticular disease of the colon without acute wall thickening. 4. Moderate hiatal hernia. 5. Aortic atherosclerosis. Aortic Atherosclerosis (ICD10-I70.0). Electronically Signed   By: Esmeralda Hedge M.D.   On: 08/01/2023 22:51   DG Chest Port 1 View Result Date: 08/01/2023 CLINICAL DATA:  Fatigue, vomiting, questionable sepsis EXAM: PORTABLE CHEST 1 VIEW COMPARISON:  08/23/2022 FINDINGS: Single frontal view of the chest demonstrates a stable cardiac silhouette. No acute airspace disease, effusion, or pneumothorax. Stable hiatal hernia. No acute bony abnormalities. IMPRESSION: 1. No acute intrathoracic process. Electronically Signed   By: Bobbye Burrow M.D.   On: 08/01/2023 10:44      Rosena Conradi, MD  [facility] 08/02/2023  If 7PM-7AM, please contact night-coverage

## 2023-08-03 ENCOUNTER — Other Ambulatory Visit

## 2023-08-03 ENCOUNTER — Inpatient Hospital Stay
Admission: RE | Admit: 2023-08-03 | Discharge: 2023-08-03 | Disposition: A | Discharge status: Home / Self Care | Source: Ambulatory Visit | Attending: Surgical | Admitting: Surgical

## 2023-08-03 DIAGNOSIS — N390 Urinary tract infection, site not specified: Secondary | ICD-10-CM | POA: Diagnosis not present

## 2023-08-03 DIAGNOSIS — A419 Sepsis, unspecified organism: Secondary | ICD-10-CM | POA: Diagnosis not present

## 2023-08-03 LAB — BPAM RBC
Blood Product Expiration Date: 202505162359
ISSUE DATE / TIME: 202504221110
Unit Type and Rh: 9500

## 2023-08-03 LAB — CBC
HCT: 24.8 % — ABNORMAL LOW (ref 36.0–46.0)
Hemoglobin: 7.4 g/dL — ABNORMAL LOW (ref 12.0–15.0)
MCH: 23.5 pg — ABNORMAL LOW (ref 26.0–34.0)
MCHC: 29.8 g/dL — ABNORMAL LOW (ref 30.0–36.0)
MCV: 78.7 fL — ABNORMAL LOW (ref 80.0–100.0)
Platelets: 165 10*3/uL (ref 150–400)
RBC: 3.15 MIL/uL — ABNORMAL LOW (ref 3.87–5.11)
RDW: 16 % — ABNORMAL HIGH (ref 11.5–15.5)
WBC: 3.5 10*3/uL — ABNORMAL LOW (ref 4.0–10.5)
nRBC: 0 % (ref 0.0–0.2)

## 2023-08-03 LAB — TYPE AND SCREEN
ABO/RH(D): O NEG
Antibody Screen: NEGATIVE
Unit division: 0

## 2023-08-03 LAB — BASIC METABOLIC PANEL WITH GFR
Anion gap: 11 (ref 5–15)
BUN: 9 mg/dL (ref 6–20)
CO2: 22 mmol/L (ref 22–32)
Calcium: 8.1 mg/dL — ABNORMAL LOW (ref 8.9–10.3)
Chloride: 104 mmol/L (ref 98–111)
Creatinine, Ser: 0.73 mg/dL (ref 0.44–1.00)
GFR, Estimated: >60 mL/min (ref >60)
Glucose, Bld: 110 mg/dL — ABNORMAL HIGH (ref 70–99)
Potassium: 3.6 mmol/L (ref 3.5–5.1)
Sodium: 137 mmol/L (ref 135–145)

## 2023-08-03 LAB — MAGNESIUM: Magnesium: 1.9 mg/dL (ref 1.7–2.4)

## 2023-08-03 NOTE — Plan of Care (Signed)
   Problem: Health Behavior/Discharge Planning: Goal: Ability to manage health-related needs will improve Outcome: Progressing   Problem: Clinical Measurements: Goal: Ability to maintain clinical measurements within normal limits will improve Outcome: Progressing Goal: Will remain free from infection Outcome: Progressing Goal: Diagnostic test results will improve Outcome: Progressing

## 2023-08-03 NOTE — Progress Notes (Addendum)
 PROGRESS NOTE  [name] ZOX:[id number] DOB: [birth year] DOA: 08/01/2023 PCP: Gabriel [name], NP   LOS: 2 days   Brief narrative:  [name] is a 58 y.o. female with past medical history of anemia, depression, hypertension, GERD, migraine presented to hospital with worsening weakness and chills for 4 days with some nausea and vomiting and back pain/flank pain..  No difficulty with urinating.  Had been having migraine headache and was taking Excedrin at home.  In the ED, patient was febrile with a temperature of 103.3 F with tachycardia and tachypnea.  WBC was 5.9.  Potassium low at 3.3.  Lactate elevated at 2.4.  Urinalysis with large white cells.  Blood culture and urine cultures were sent from the ED and patient was out of the hospital for further evaluation and treatment.      Assessment/Plan: Principal Problem:   Sepsis secondary to UTI [facility]) Active Problems:   AKI (acute kidney injury) (HCC)   Hypokalemia   Iron  deficiency anemia   Obesity (BMI 30-39.9)  Sepsis secondary to UTI/pyelonephritis Patient presented with fever up to 103.3 F with tachycardia and tachypnea with UTI, elevated lactate meeting criteria for sepsis.  Complains of right costovertebral angle tenderness today.  Urinalysis significant for large leukocytes, many bacteria, 6-10 squamous epithelial cells/hpf, and greater than 50 WBCs.  Blood cultures negative so far.  Pending urine cultures.   Continue IV Rocephin .  Temperature max of 100.7 F.  Acute kidney injury On admission creatinine was 1.0.  Creatinine today at 0.7, baseline around 0.6-0.7.  Received IV fluids.  Will continue to monitor.  Encourage oral hydration.   Hypokalemia Initial potassium of 3.3.  Improved to 3.6 after replacement.   Iron  deficiency anemia Acute on chronic.  Patient received 1 unit of packed RBC for hemoglobin of 6.5 on 08/02/2023.[name] Hemoglobin had been 8.7 when checked back in 02/2023.  Fecal occult blood was negative.  Had not  been taking iron  until yesterday and history of iron  infusion in the past.  Patient with history of endoscopy 04/2022 with Dr. Antony Baumgartner which revealed grade C esophagitis and large hiatal hernia.  Colonoscopy during that same time revealed diverticulosis throughout the entire colon.   Spoke with the patient about his and she is okay with that.  Folic acid  at 21 vitamin B-12 10131.  Iron  of 9 with ferritin of 31.  Class I obesity BMI 32.89 kg/m.  Would benefit from lifestyle modifications and weight loss.  DVT prophylaxis: SCDs Start: 08/01/23 1104   Disposition: Home likely in 1 to 2 days  Status is: Inpatient Remains inpatient appropriate because: Pending clinical improvement, IV antibiotics, pending cultures,    Code Status:     Code Status: Full Code  Family Communication: None at bedside  Consultants: None  Procedures: PRBC transfusion 1 unit on 08/02/2023  Anti-infectives:  Rocephin  IV  Anti-infectives (From admission, onward)    Start     Dose/Rate Route Frequency Ordered Stop   08/02/23 0930  cefTRIAXone  (ROCEPHIN ) 2 g in sodium chloride  0.9 % 100 mL IVPB        2 g 200 mL/hr over 30 Minutes Intravenous Every 24 hours 08/01/23 1107     08/01/23 0930  cefTRIAXone  (ROCEPHIN ) 2 g in sodium chloride  0.9 % 100 mL IVPB        2 g 200 mL/hr over 30 Minutes Intravenous Once 08/01/23 0917 08/01/23 0958        Subjective: Today, patient was seen and examined at bedside.  Patient  complains of migraine headache and had fever this morning.  Complains of  right back pain.  Denies any dysuria or urgency.  Had extreme diaphoresis yesterday which has improved.  Received 1 L of packed RBC yesterday.  Objective: Vitals:   08/03/23 0557 08/03/23 0851  BP: (!) 103/56 115/63  Pulse: 76 76  Resp: 20   Temp: (!) 100.7 F (38.2 C) 99.9 F (37.7 C)  SpO2: 94% 95%    Intake/Output Summary (Last 24 hours) at 08/03/2023 1120 Last data filed at 08/03/2023 1044 Gross per 24 hour  Intake  435 ml  Output --  Net 435 ml   Filed Weights   08/01/23 0548 08/01/23 1307  Weight: 69.4 kg 76.4 kg   Body mass index is 32.89 kg/m.   Physical Exam: GENERAL: Patient is alert awake and oriented. Not in obvious distress.  Obese built HENT: Pallor noted pupils equally reactive to light. Oral mucosa is moist NECK: is supple, no gross swelling noted. CHEST: Clear to auscultation. No crackles or wheezes.   CVS: S1 and S2 heard, no murmur. Regular rate and rhythm.  ABDOMEN: Soft, non-tender, bowel sounds are present.  Right costovertebral angle tenderness on palpation. EXTREMITIES: No edema. CNS: Cranial nerves are intact.  Data Review: I have personally reviewed the following laboratory data and studies,  CBC: Recent Labs  Lab 08/01/23 0658 08/01/23 2201 08/02/23 0451 08/02/23 1748 08/03/23 0525  WBC 5.9  --  4.8  --  3.5*  NEUTROABS 5.2  --   --   --   --   HGB 7.8* 6.8* 6.5* 7.8* 7.4*  HCT 27.8* 23.4* 22.7* 25.6* 24.8*  MCV 80.8  --  78.8*  --  78.7*  PLT 186  --  183  --  165   Basic Metabolic Panel: Recent Labs  Lab 08/01/23 0658 08/02/23 0451 08/03/23 0525  NA 136 136 137  K 3.3* 3.6 3.6  CL 104 107 104  CO2 20* 23 22  GLUCOSE 129* 104* 110*  BUN 17 11 9   CREATININE 1.03* 0.88 0.73  CALCIUM  8.4* 8.2* 8.1*  MG  --   --  1.9   Liver Function Tests: Recent Labs  Lab 08/01/23 0658  AST 37  ALT 25  ALKPHOS 80  BILITOT 0.6  PROT 6.7  ALBUMIN  2.8*   No results for input(s): "LIPASE", "AMYLASE" in the last 168 hours. No results for input(s): "AMMONIA" in the last 168 hours. Cardiac Enzymes: No results for input(s): "CKTOTAL", "CKMB", "CKMBINDEX", "TROPONINI" in the last 168 hours. BNP (last 3 results) No results for input(s): "BNP" in the last 8760 hours.  ProBNP (last 3 results) No results for input(s): "PROBNP" in the last 8760 hours.  CBG: No results for input(s): "GLUCAP" in the last 168 hours. Recent Results (from the past 240 hours)  Blood  Culture (routine x 2)     Status: None (Preliminary result)   Collection Time: 08/01/23  6:36 AM   Specimen: BLOOD  Result Value Ref Range Status   Specimen Description BLOOD SITE NOT SPECIFIED  Final   Special Requests   Final    BOTTLES DRAWN AEROBIC AND ANAEROBIC Blood Culture results may not be optimal due to an inadequate volume of blood received in culture bottles   Culture   Final    NO GROWTH 2 DAYS Performed at [facility], [address] [place], [place] [address]    Report Status PENDING  Incomplete  Blood Culture (routine x 2)  Status: None (Preliminary result)   Collection Time: 08/01/23  6:58 AM   Specimen: BLOOD  Result Value Ref Range Status   Specimen Description BLOOD BLOOD LEFT HAND AEROBIC BOTTLE ONLY  Final   Special Requests   Final    BOTTLES DRAWN AEROBIC ONLY Blood Culture results may not be optimal due to an inadequate volume of blood received in culture bottles   Culture   Final    NO GROWTH 2 DAYS Performed at [facility], [address] [place], [place] [address]    Report Status PENDING  Incomplete  Resp panel by RT-PCR (RSV, Flu A&B, Covid) Anterior Nasal Swab     Status: None   Collection Time: 08/01/23  7:48 AM   Specimen: Anterior Nasal Swab  Result Value Ref Range Status   SARS Coronavirus 2 by RT PCR NEGATIVE NEGATIVE Final   Influenza A by PCR NEGATIVE NEGATIVE Final   Influenza B by PCR NEGATIVE NEGATIVE Final    Comment: (NOTE) The Xpert Xpress SARS-CoV-2/FLU/RSV plus assay is intended as an aid in the diagnosis of influenza from Nasopharyngeal swab specimens and should not be used as a sole basis for treatment. Nasal washings and aspirates are unacceptable for Xpert Xpress SARS-CoV-2/FLU/RSV testing.  Fact Sheet for Patients: BloggerCourse.com  Fact Sheet for Healthcare Providers: SeriousBroker.it  This test is not yet approved or cleared by the [place]  FDA  and has been authorized for detection and/or diagnosis of SARS-CoV-2 by FDA under an Emergency Use Authorization (EUA). This EUA will remain in effect (meaning this test can be used) for the duration of the COVID-19 declaration under Section 564(b)(1) of the Act, 21 U.S.C. section 360bbb-3(b)(1), unless the authorization is terminated or revoked.     Resp Syncytial Virus by PCR NEGATIVE NEGATIVE Final    Comment: (NOTE) Fact Sheet for Patients: BloggerCourse.com  Fact Sheet for Healthcare Providers: SeriousBroker.it  This test is not yet approved or cleared by the [place]  FDA and has been authorized for detection and/or diagnosis of SARS-CoV-2 by FDA under an Emergency Use Authorization (EUA). This EUA will remain in effect (meaning this test can be used) for the duration of the COVID-19 declaration under Section 564(b)(1) of the Act, 21 U.S.C. section 360bbb-3(b)(1), unless the authorization is terminated or revoked.  Performed at [facility], [address], [place], [place] [address]      Studies: CT RENAL STONE STUDY Result Date: 08/01/2023 CLINICAL DATA:  Flank pain EXAM: CT ABDOMEN AND PELVIS WITHOUT CONTRAST TECHNIQUE: Multidetector CT imaging of the abdomen and pelvis was performed following the standard protocol without IV contrast. RADIATION DOSE REDUCTION: This exam was performed according to the departmental dose-optimization program which includes automated exposure control, adjustment of the mA and/or kV according to patient size and/or use of iterative reconstruction technique. COMPARISON:  None Available. FINDINGS: Lower chest: Lung bases demonstrate no acute airspace disease. Cardiomegaly. Moderate hiatal hernia Hepatobiliary: No focal liver abnormality is seen. No gallstones, gallbladder wall thickening, or biliary dilatation. Pancreas: Unremarkable. No pancreatic ductal dilatation or surrounding  inflammatory changes. Spleen: Normal in size without focal abnormality. Adrenals/Urinary Tract: Adrenal glands are normal. Nonspecific perinephric fat stranding. Multiple bilateral small kidney stones. Minimal bilateral hydronephrosis, no obstructing ureteral stone. Bladder is normal Stomach/Bowel: Stomach nonenlarged. No dilated small bowel. Diverticular disease of the colon without acute wall thickening. Vascular/Lymphatic: Aortic atherosclerosis. No enlarged abdominal or pelvic lymph nodes. Reproductive: Status post hysterectomy. No adnexal masses. Other: Negative for pelvic effusion or  free air Musculoskeletal: No acute or suspicious osseous abnormality IMPRESSION: 1. Multiple bilateral small kidney stones. Minimal bilateral hydronephrosis, no obstructing ureteral stone. 2. Left greater than right perinephric fat stranding, nonspecific but correlate for signs of upper urinary tract infection. 3. Diverticular disease of the colon without acute wall thickening. 4. Moderate hiatal hernia. 5. Aortic atherosclerosis. Aortic Atherosclerosis (ICD10-I70.0). Electronically Signed   By: Esmeralda Hedge M.D.   On: 08/01/2023 22:51      Rosena Conradi, MD  [facility] 08/03/2023  If 7PM-7AM, please contact night-coverage

## 2023-08-04 ENCOUNTER — Encounter: Payer: Self-pay | Admitting: Internal Medicine

## 2023-08-04 ENCOUNTER — Other Ambulatory Visit (HOSPITAL_COMMUNITY): Payer: Self-pay

## 2023-08-04 DIAGNOSIS — N3001 Acute cystitis with hematuria: Secondary | ICD-10-CM | POA: Diagnosis not present

## 2023-08-04 DIAGNOSIS — A419 Sepsis, unspecified organism: Secondary | ICD-10-CM | POA: Diagnosis not present

## 2023-08-04 DIAGNOSIS — D509 Iron deficiency anemia, unspecified: Secondary | ICD-10-CM | POA: Diagnosis not present

## 2023-08-04 LAB — CBC
HCT: 25.5 % — ABNORMAL LOW (ref 36.0–46.0)
Hemoglobin: 7.5 g/dL — ABNORMAL LOW (ref 12.0–15.0)
MCH: 23.1 pg — ABNORMAL LOW (ref 26.0–34.0)
MCHC: 29.4 g/dL — ABNORMAL LOW (ref 30.0–36.0)
MCV: 78.7 fL — ABNORMAL LOW (ref 80.0–100.0)
Platelets: 189 10*3/uL (ref 150–400)
RBC: 3.24 MIL/uL — ABNORMAL LOW (ref 3.87–5.11)
RDW: 16.1 % — ABNORMAL HIGH (ref 11.5–15.5)
WBC: 3.3 10*3/uL — ABNORMAL LOW (ref 4.0–10.5)
nRBC: 0 % (ref 0.0–0.2)

## 2023-08-04 LAB — BASIC METABOLIC PANEL WITH GFR
Anion gap: 9 (ref 5–15)
BUN: 7 mg/dL (ref 6–20)
CO2: 23 mmol/L (ref 22–32)
Calcium: 8.3 mg/dL — ABNORMAL LOW (ref 8.9–10.3)
Chloride: 107 mmol/L (ref 98–111)
Creatinine, Ser: 0.65 mg/dL (ref 0.44–1.00)
GFR, Estimated: >60 mL/min (ref >60)
Glucose, Bld: 101 mg/dL — ABNORMAL HIGH (ref 70–99)
Potassium: 3.7 mmol/L (ref 3.5–5.1)
Sodium: 139 mmol/L (ref 135–145)

## 2023-08-04 LAB — MAGNESIUM: Magnesium: 2 mg/dL (ref 1.7–2.4)

## 2023-08-04 MED ORDER — PANTOPRAZOLE SODIUM 40 MG PO TBEC
40.0000 mg | DELAYED_RELEASE_TABLET | Freq: Every day | ORAL | 5 refills | Status: DC
  Filled 2023-08-04: qty 30, 30d supply, fill #0

## 2023-08-04 MED ORDER — FERROUS SULFATE 325 (65 FE) MG PO TABS
325.0000 mg | ORAL_TABLET | Freq: Every day | ORAL | 2 refills | Status: AC
  Filled 2023-08-04: qty 30, 30d supply, fill #0

## 2023-08-04 MED ORDER — PROCHLORPERAZINE MALEATE 10 MG PO TABS
10.0000 mg | ORAL_TABLET | Freq: Four times a day (QID) | ORAL | 0 refills | Status: DC | PRN
  Filled 2023-08-04: qty 30, 8d supply, fill #0

## 2023-08-04 MED ORDER — CIPROFLOXACIN HCL 500 MG PO TABS
500.0000 mg | ORAL_TABLET | Freq: Two times a day (BID) | ORAL | 0 refills | Status: AC
  Filled 2023-08-04: qty 8, 4d supply, fill #0

## 2023-08-04 NOTE — Plan of Care (Signed)

## 2023-08-04 NOTE — TOC Transition Note (Signed)
 Transition of Care [facility]) - Discharge Note   Patient Details  Name: [name] MRN: [id number] Date of Birth: 07/02/1965  [facility]) CM/SW Contact:  Tom-Johnson, Angelique Ken, RN Phone Number: 08/04/2023, 3:46 PM   Clinical Narrative:     Patient is scheduled for discharge today.  Readmission Risk Assessment done. Outpatient f/u, hospital f/u and discharge instructions on AVS. Prescriptions sent to [facility] pharmacy and patient will receive meds prior discharge. No TOC needs or recommendations noted. Husband, [name] to transport at discharge.  No further TOC needs noted.      Final next level of care: Home/Self Care Barriers to Discharge: Barriers Resolved   Patient Goals and CMS Choice Patient states their goals for this hospitalization and ongoing recovery are:: To return home CMS Medicare.gov Compare Post Acute Care list provided to:: Patient Choice offered to / list presented to : NA      Discharge Placement                Patient to be transferred to facility by: Husband Name of family member notified: [name]    Discharge Plan and Services Additional resources added to the After Visit Summary for                  DME Arranged: N/A DME Agency: NA       HH Arranged: NA HH Agency: NA        Social Drivers of Health (SDOH) Interventions SDOH Screenings   Food Insecurity: No Food Insecurity (08/01/2023)  Housing: Low Risk  (08/01/2023)  Transportation Needs: No Transportation Needs (08/01/2023)  Utilities: Not At Risk (08/01/2023)  Depression (PHQ2-9): High Risk (12/06/2022)  Tobacco Use: Low Risk  (08/01/2023)     Readmission Risk Interventions    08/02/2023    3:16 PM  Readmission Risk Prevention Plan  Post Dischage Appt Complete  Medication Screening Complete  Transportation Screening Complete

## 2023-08-04 NOTE — Discharge Instructions (Signed)
 [name], It was a pleasure taking care of you at [facility] You were admitted for sepsis due to urinary tract infection and treated with IV antibiotics. We are discharging you home now that you are doing better. Please follow the following instructions.  1) take ciprofloxacin  500 mg twice daily for 4 more days 2) take Compazine  10 mg every 6 hours as needed for nausea or vomiting 3) continue taking your Protonix  and iron  supplement daily 4) make an appointment with your primary care doctor for hospital follow-up and blood work  Take care,  Dr. Redge Cancel, MD, MPH

## 2023-08-04 NOTE — Discharge Summary (Addendum)
 Physician Discharge Summary   Patient: [name] MRN: [id number] DOB: 1965-07-31  Admit date:     08/01/2023  Discharge date: 08/04/23  Discharge Physician: Vita Grip   PCP: Gabriel [name], NP   Recommendations at discharge:   Please follow-up with urine culture and adjust antibiotics as appropriate Repeat CBC to ensure hemoglobin is stable, ensure patient is taking iron  supplements  Discharge Diagnoses: Principal Problem:   Sepsis secondary to UTI [facility]) Active Problems:   Iron  deficiency anemia   Obesity (BMI 30-39.9)  Resolved Problems:   AKI (acute kidney injury) (HCC)   Hypokalemia  Hospital Course: [name] is a 58 y.o. female with past medical history of anemia, depression, hypertension, GERD, migraine presented to hospital with worsening weakness and chills for 4 days with some nausea and vomiting and back pain/flank pain. No difficulty with urinating.  Had been having migraine headache and was taking Excedrin at home.  In the ED, patient was febrile with a temperature of 103.3 F with tachycardia and tachypnea.  WBC was 5.9.  Potassium low at 3.3.  Lactate elevated at 2.4.  Urinalysis with large white cells.  Blood culture and urine cultures were sent from the ED and patient was admitted at the hospital for further evaluation and treatment.  Her potassium was repleted, acute kidney injury resolved with IV fluids and her symptoms improved with IV Rocephin .  Urine culture was pending discharged.  Patient discharged home on ciprofloxacin  based on results of previous urine culture.  Assessment and Plan: Sepsis secondary to UTI/pyelonephritis Patient presented with fever up to 103.3 F with tachycardia and tachypnea with UTI, elevated lactate meeting criteria for sepsis. Urinalysis significant for large leukocytes, many bacteria, 6-10 squamous epithelial cells/hpf, and greater than 50 WBCs. Blood cultures negative.  Urine culture not obtained on admission so repeat  was placed on the day of discharge. Patient was discharged home on ciprofloxacin  based on sensitivities of urine culture from 01/02/2022 that grew Citrobacter.   Acute kidney injury, resolved Creatinine of 0.65 on day of discharge   Hypokalemia Initial potassium of 3.3. Improved to 3.7 on day of discharge after repletion.   Iron  deficiency anemia Microcytic anemia Acute on chronic.  Patient received 1 unit of packed RBC for hemoglobin of 6.5 on 08/02/2023.[name] Hemoglobin had been 8.7 when checked back in 02/2023.  Fecal occult blood was negative.  Had not been taking iron  until yesterday and history of iron  infusion in the past.  Patient with history of endoscopy 04/2022 with Dr. Antony Baumgartner which revealed grade C esophagitis and large hiatal hernia.  Colonoscopy during that same time revealed diverticulosis throughout the entire colon.   Spoke with the patient about his and she is okay with that. Folic acid  at 21 vitamin B-12 10131.  Iron  of 9 with ferritin of 31.  Patient was discharged home to continue home iron  supplement.  Hemoglobin was stable at 7.5 at discharge.   Class I obesity BMI 32.89 kg/m. Would benefit from lifestyle modifications and weight loss.       Consultants: None Procedures performed: None Disposition: Home Diet recommendation:  Discharge Diet Orders (From admission, onward)     Start     Ordered   08/04/23 0000  Diet - low sodium heart healthy        08/04/23 1534           Regular diet DISCHARGE MEDICATION: Allergies as of 08/04/2023       Reactions   Topamax  [topiramate ]    "  cant function"    Tramadol     Cant function with it    Gluten Meal Diarrhea, Nausea Only      Ondansetron  Other (See Comments)   Helps her nausea, but makes HA worse        Medication List     TAKE these medications    acetaminophen -codeine  300-30 MG tablet Commonly known as: TYLENOL  #3 Take 1 tablet by mouth at bedtime as needed for moderate pain (pain score 4-6).    ciprofloxacin  500 MG tablet Commonly known as: Cipro  Take 1 tablet (500 mg total) by mouth 2 (two) times daily for 4 days.   ferrous sulfate  325 (65 FE) MG EC tablet Take 1 tablet (325 mg total) by mouth daily with breakfast. What changed: when to take this   pantoprazole  40 MG tablet Commonly known as: PROTONIX  Take 1 tablet (40 mg total) by mouth daily. Start taking on: August 05, 2023 What changed:  when to take this additional instructions   prochlorperazine  10 MG tablet Commonly known as: COMPAZINE  Take 1 tablet (10 mg total) by mouth every 6 (six) hours as needed for nausea or vomiting.        Follow-up Information     Gabriel [name], NP. Schedule an appointment as soon as possible for a visit in 1 week(s).   Specialty: Internal Medicine Contact information: [address] [place] [address] [phone]                Subjective: Patient evaluated laying comfortably in bed. She reports having some nausea yesterday that improved with Compazine . Nausea has resolved today.  Her flank pain has improved.  She denies any nausea or vomiting.  Discussed plan for discharge and instructions to follow-up with PCP next week. Patient agreeable to plan.   Discharge Exam: Filed Weights   08/01/23 0548 08/01/23 1307  Weight: 69.4 kg 76.4 kg   General: Pleasant, well-appearing middle-age woman laying in bed. No acute distress. HEENT: [place]/AT. Anicteric sclera. Pale conjunctiva CV: RRR. No murmurs, rubs, or gallops. No LE edema Pulmonary: Lungs CTAB. Normal effort. No wheezing or rales. Abdominal: Soft, nontender, nondistended. Normal bowel sounds. Extremities: Palpable radial and DP pulses. Normal ROM. Skin: Warm and dry. No obvious rash or lesions. Neuro: A&Ox3. Moves all extremities. Normal sensation to light touch. No focal deficit. Psych: Normal mood and affect   Condition at discharge: good  The results of significant diagnostics from this  hospitalization (including imaging, microbiology, ancillary and laboratory) are listed below for reference.   Imaging Studies: CT RENAL STONE STUDY Result Date: 08/01/2023 CLINICAL DATA:  Flank pain EXAM: CT ABDOMEN AND PELVIS WITHOUT CONTRAST TECHNIQUE: Multidetector CT imaging of the abdomen and pelvis was performed following the standard protocol without IV contrast. RADIATION DOSE REDUCTION: This exam was performed according to the departmental dose-optimization program which includes automated exposure control, adjustment of the mA and/or kV according to patient size and/or use of iterative reconstruction technique. COMPARISON:  None Available. FINDINGS: Lower chest: Lung bases demonstrate no acute airspace disease. Cardiomegaly. Moderate hiatal hernia Hepatobiliary: No focal liver abnormality is seen. No gallstones, gallbladder wall thickening, or biliary dilatation. Pancreas: Unremarkable. No pancreatic ductal dilatation or surrounding inflammatory changes. Spleen: Normal in size without focal abnormality. Adrenals/Urinary Tract: Adrenal glands are normal. Nonspecific perinephric fat stranding. Multiple bilateral small kidney stones. Minimal bilateral hydronephrosis, no obstructing ureteral stone. Bladder is normal Stomach/Bowel: Stomach nonenlarged. No dilated small bowel. Diverticular disease of the colon without acute wall thickening.  Vascular/Lymphatic: Aortic atherosclerosis. No enlarged abdominal or pelvic lymph nodes. Reproductive: Status post hysterectomy. No adnexal masses. Other: Negative for pelvic effusion or free air Musculoskeletal: No acute or suspicious osseous abnormality IMPRESSION: 1. Multiple bilateral small kidney stones. Minimal bilateral hydronephrosis, no obstructing ureteral stone. 2. Left greater than right perinephric fat stranding, nonspecific but correlate for signs of upper urinary tract infection. 3. Diverticular disease of the colon without acute wall thickening. 4.  Moderate hiatal hernia. 5. Aortic atherosclerosis. Aortic Atherosclerosis (ICD10-I70.0). Electronically Signed   By: Esmeralda Hedge M.D.   On: 08/01/2023 22:51   DG Chest Port 1 View Result Date: 08/01/2023 CLINICAL DATA:  Fatigue, vomiting, questionable sepsis EXAM: PORTABLE CHEST 1 VIEW COMPARISON:  08/23/2022 FINDINGS: Single frontal view of the chest demonstrates a stable cardiac silhouette. No acute airspace disease, effusion, or pneumothorax. Stable hiatal hernia. No acute bony abnormalities. IMPRESSION: 1. No acute intrathoracic process. Electronically Signed   By: Bobbye Burrow M.D.   On: 08/01/2023 10:44   XR Shoulder Left Result Date: 07/15/2023 AP, Scap Y, axillary views of left shoulder reviewed.  No fracture or dislocation.  Radiographic evidence of distal clavicle excision is noted with decompressed AC joint.  No significant degenerative changes of the glenohumeral joint.  No loss of acromiohumeral interval.   Microbiology: Results for orders placed or performed during the hospital encounter of 08/01/23  Blood Culture (routine x 2)     Status: None (Preliminary result)   Collection Time: 08/01/23  6:36 AM   Specimen: BLOOD  Result Value Ref Range Status   Specimen Description BLOOD SITE NOT SPECIFIED  Final   Special Requests   Final    BOTTLES DRAWN AEROBIC AND ANAEROBIC Blood Culture results may not be optimal due to an inadequate volume of blood received in culture bottles   Culture   Final    NO GROWTH 3 DAYS Performed at [facility], [address], [place], [place] [address]    Report Status PENDING  Incomplete  Blood Culture (routine x 2)     Status: None (Preliminary result)   Collection Time: 08/01/23  6:58 AM   Specimen: BLOOD  Result Value Ref Range Status   Specimen Description BLOOD BLOOD LEFT HAND AEROBIC BOTTLE ONLY  Final   Special Requests   Final    BOTTLES DRAWN AEROBIC ONLY Blood Culture results may not be optimal due to an inadequate volume of  blood received in culture bottles   Culture   Final    NO GROWTH 3 DAYS Performed at [facility], [address], [place], [place] [address]    Report Status PENDING  Incomplete  Resp panel by RT-PCR (RSV, Flu A&B, Covid) Anterior Nasal Swab     Status: None   Collection Time: 08/01/23  7:48 AM   Specimen: Anterior Nasal Swab  Result Value Ref Range Status   SARS Coronavirus 2 by RT PCR NEGATIVE NEGATIVE Final   Influenza A by PCR NEGATIVE NEGATIVE Final   Influenza B by PCR NEGATIVE NEGATIVE Final    Comment: (NOTE) The Xpert Xpress SARS-CoV-2/FLU/RSV plus assay is intended as an aid in the diagnosis of influenza from Nasopharyngeal swab specimens and should not be used as a sole basis for treatment. Nasal washings and aspirates are unacceptable for Xpert Xpress SARS-CoV-2/FLU/RSV testing.  Fact Sheet for Patients: BloggerCourse.com  Fact Sheet for Healthcare Providers: SeriousBroker.it  This test is not yet approved or cleared by the [place]  FDA and has been  authorized for detection and/or diagnosis of SARS-CoV-2 by FDA under an Emergency Use Authorization (EUA). This EUA will remain in effect (meaning this test can be used) for the duration of the COVID-19 declaration under Section 564(b)(1) of the Act, 21 U.S.C. section 360bbb-3(b)(1), unless the authorization is terminated or revoked.     Resp Syncytial Virus by PCR NEGATIVE NEGATIVE Final    Comment: (NOTE) Fact Sheet for Patients: BloggerCourse.com  Fact Sheet for Healthcare Providers: SeriousBroker.it  This test is not yet approved or cleared by the [place]  FDA and has been authorized for detection and/or diagnosis of SARS-CoV-2 by FDA under an Emergency Use Authorization (EUA). This EUA will remain in effect (meaning this test can be used) for the duration of the COVID-19 declaration under  Section 564(b)(1) of the Act, 21 U.S.C. section 360bbb-3(b)(1), unless the authorization is terminated or revoked.  Performed at [facility], [address] [place], [place] [address]     Labs: CBC: Recent Labs  Lab 08/01/23 [phone] 08/01/23 2201 08/02/23 0451 08/02/23 1748 08/03/23 0525 08/04/23 0539  WBC 5.9  --  4.8  --  3.5* 3.3*  NEUTROABS 5.2  --   --   --   --   --   HGB 7.8* 6.8* 6.5* 7.8* 7.4* 7.5*  HCT 27.8* 23.4* 22.7* 25.6* 24.8* 25.5*  MCV 80.8  --  78.8*  --  78.7* 78.7*  PLT 186  --  183  --  165 189   Basic Metabolic Panel: Recent Labs  Lab 08/01/23 0658 08/02/23 0451 08/03/23 0525 08/04/23 0539  NA 136 136 137 139  K 3.3* 3.6 3.6 3.7  CL 104 107 104 107  CO2 20* 23 22 23   GLUCOSE 129* 104* 110* 101*  BUN 17 11 9 7   CREATININE 1.03* 0.88 0.73 0.65  CALCIUM  8.4* 8.2* 8.1* 8.3*  MG  --   --  1.9 2.0   Liver Function Tests: Recent Labs  Lab 08/01/23 0658  AST 37  ALT 25  ALKPHOS 80  BILITOT 0.6  PROT 6.7  ALBUMIN  2.8*   CBG: No results for input(s): "GLUCAP" in the last 168 hours.  Discharge time spent: greater than 30 minutes.  Signed: Vita Grip, MD [facility] 08/04/2023

## 2023-08-05 ENCOUNTER — Telehealth: Payer: Self-pay

## 2023-08-05 LAB — URINE CULTURE: Culture: <10000 — AB

## 2023-08-05 NOTE — Transitions of Care (Post Inpatient/ED Visit) (Signed)
   08/05/2023  Name: [name] MRN: [id number] DOB: March 07, 1966  Today's TOC FU Call Status: Today's TOC FU Call Status:: Unsuccessful Call (1st Attempt) Unsuccessful Call (1st Attempt) Date: 08/05/23  Attempted to reach the patient regarding the most recent Inpatient/ED visit.  Follow Up Plan: Additional outreach attempts will be made to reach the patient to complete the Transitions of Care (Post Inpatient/ED visit) call.   Brown Cape, RN, BSN, CCM [facility], [facility] RN Care Manager Direct Dial: [phone]

## 2023-08-06 LAB — CULTURE, BLOOD (ROUTINE X 2)
Culture: NO GROWTH
Culture: NO GROWTH

## 2023-08-08 ENCOUNTER — Telehealth: Payer: Self-pay

## 2023-08-08 NOTE — Transitions of Care (Post Inpatient/ED Visit) (Signed)
   08/08/2023  Name: [name] MRN: [id number] DOB: [birth year]  Today's TOC FU Call Status: Today's TOC FU Call Status:: Unsuccessful Call (2nd Attempt) Unsuccessful Call (2nd Attempt) Date: 08/08/23  Attempted to reach the patient regarding the most recent Inpatient/ED visit. Left a HIPAA appropriate voicemail message requesting a return call.  Follow Up Plan: Additional outreach attempts will be made to reach the patient to complete the Transitions of Care (Post Inpatient/ED visit) call.   Brown Cape, RN, BSN, CCM [facility], [facility] RN Care Manager Direct Dial: [phone]

## 2023-08-09 ENCOUNTER — Telehealth: Payer: Self-pay

## 2023-08-09 NOTE — Transitions of Care (Post Inpatient/ED Visit) (Signed)
   08/09/2023  Name: Mehek Zody MRN: [id number] DOB: 08-12-1965  Today's TOC FU Call Status: Unsuccessful Call (3rd Attempt)    Attempted to reach the patient regarding the most recent Inpatient/ED visit.  Follow Up Plan: No further outreach attempts will be made at this time. We have been unable to contact the patient.  Brown Cape, RN, BSN, CCM [facility], [facility] RN Care Manager Direct Dial: [phone]

## 2023-08-10 ENCOUNTER — Encounter: Payer: Self-pay | Admitting: Primary Care

## 2023-08-10 ENCOUNTER — Ambulatory Visit: Admitting: Primary Care

## 2023-08-10 VITALS — BP 122/84 | HR 76 | Temp 98.2°F | Ht 60.0 in | Wt 158.1 lb

## 2023-08-10 DIAGNOSIS — N390 Urinary tract infection, site not specified: Secondary | ICD-10-CM | POA: Diagnosis not present

## 2023-08-10 DIAGNOSIS — A419 Sepsis, unspecified organism: Secondary | ICD-10-CM

## 2023-08-10 DIAGNOSIS — K219 Gastro-esophageal reflux disease without esophagitis: Secondary | ICD-10-CM | POA: Diagnosis not present

## 2023-08-10 DIAGNOSIS — D509 Iron deficiency anemia, unspecified: Secondary | ICD-10-CM

## 2023-08-10 MED ORDER — PANTOPRAZOLE SODIUM 40 MG PO TBEC: 40.0000 mg | DELAYED_RELEASE_TABLET | Freq: Two times a day (BID) | ORAL | 1 refills | Status: DC

## 2023-08-10 NOTE — Patient Instructions (Signed)
 Continue taking the ferrous sulfate  iron  pill once daily with food.  Reschedule your appointment with the hematologist (blood doctor).  Stop by the lab prior to leaving today. I will notify you of your results once received.   It was a pleasure to see you today!

## 2023-08-10 NOTE — Assessment & Plan Note (Addendum)
 With recent hospitalization. Hospital notes, labs, imaging reviewed.  Complete ciprofloxacin  antibiotic as prescribed.  Work note provided for missed time this week.

## 2023-08-10 NOTE — Assessment & Plan Note (Signed)
 Continue ferrous sulfate  325 mg daily. Repeat CBC pending today.  Recommended she reschedule with hematology as she missed her last appointment.

## 2023-08-10 NOTE — Assessment & Plan Note (Signed)
 Deteriorated.  Prescription for pantoprazole  corrected.  Resume pantoprazole  40 mg twice daily.

## 2023-08-10 NOTE — Progress Notes (Signed)
 Subjective:    Patient ID: [name], female    DOB: [birth year], 58 y.o.   MRN: [id number]  HPI  Eh Maners is a very pleasant 58 y.o. female with a history of recurrent UTI, hypertension, chronic constipation, hyperlipidemia, MDD, paranoid delusion, prediabetes who presents today for hospital follow-up.  She presented to [facility] ED on 08/01/2023 for a 5-day history of generalized bodyaches and fatigue with dark-colored urine, lower abdominal pain and low back pain.  She was noted to be febrile.  Workup in the ED revealed acute cystitis with sepsis.  She was admitted for further evaluation and treatment.  During her hospital stay she was treated with IV antibiotics, IV fluids.  She was noted to be anemic with hemoglobin 7.8 with negative stool studies.  Colonoscopy and upper endoscopy were up-to-date.  She required transfusion of 1 unit of packed red blood cells.  Symptoms improved along with lab results that she was discharged home on 08/04/2023 with prescription for ciprofloxacin , recommendations to resume iron  supplement.   Since her discharge home she continues to feel weak and tired, feels better than she did when she went to the hospital. Gets tired easily with tasks. She missed work on Monday, Tuesday, and today this week from work. She doesn't plan on returning to work this week. Urine culture returned on 08/05/23 with <10,000 colonies, insignificant growth. She has been taking ciprofloxacin , ferrous sulfate , and compazine . She follows with hematology for iron  deficiency anemia, had to cancel her last appointment, needs to reschedule. She's had to take the compazine  once daily with her antibiotics due to nausea.   She does not have intermittent FMLA. She needs her pantoprazole  prescription refilled as it's written incorrectly, she's supposed be taking pantoprazole  twice daily.   Review of Systems  Constitutional:  Positive for fatigue. Negative for fever.  Respiratory:  Negative for  shortness of breath.   Cardiovascular:  Negative for chest pain.  Gastrointestinal:  Negative for blood in stool.  Genitourinary:  Negative for vaginal bleeding.         Past Medical History:  Diagnosis Date   Anemia    BV (bacterial vaginosis) 11/27/2012   Celiac disease    Cough due to ACE inhibitor 04/25/2019   Depression    Essential hypertension    Family history of adverse reaction to anesthesia    MOM-HARD TIME WAKING UP   GERD (gastroesophageal reflux disease)    Migraine with visual aura    MIGRAINES   UTI (lower urinary tract infection)     Social History   Socioeconomic History   Marital status: Married    Spouse name: [name]   Number of children: 3   Years of education: 12   Highest education level: High school graduate  Occupational History   Occupation: Corporate treasurer  Tobacco Use   Smoking status: Never   Smokeless tobacco: Never  Vaping Use   Vaping status: Never Used  Substance and Sexual Activity   Alcohol use: No    Alcohol/week: 0.0 standard drinks of alcohol   Drug use: No   Sexual activity: Yes    Partners: Female    Birth control/protection: Surgical    Comment: INTERCOURSE AGE 60, SEXUAL PARTNERS LEES THAN 6  Other Topics Concern   Not on file  Social History Narrative   Lives with her husband and their three children, and her daughter's boyfriend.  Her older son's daughter lives there part-time as well.   Social Drivers of Health  Financial Resource Strain: Not on file  Food Insecurity: No Food Insecurity (08/01/2023)   Hunger Vital Sign    Worried About Running Out of Food in the Last Year: Never true    Ran Out of Food in the Last Year: Never true  Transportation Needs: No Transportation Needs (08/01/2023)   PRAPARE - Administrator, Civil Service (Medical): No    Lack of Transportation (Non-Medical): No  Physical Activity: Not on file  Stress: Not on file  Social Connections: Not on file  Intimate  Partner Violence: Not At Risk (08/01/2023)   Humiliation, Afraid, Rape, and Kick questionnaire    Fear of Current or Ex-Partner: No    Emotionally Abused: No    Physically Abused: No    Sexually Abused: No    Past Surgical History:  Procedure Laterality Date   ABDOMINAL HYSTERECTOMY     BICEPT TENODESIS Left 01/27/2023   Procedure: BICEPS TENODESIS;  Surgeon: Jasmine Mesi, MD;  Location: [facility] OR;  Service: Orthopedics;  Laterality: Left;   BLADDER SUSPENSION     COLONOSCOPY WITH PROPOFOL  N/A 05/11/2022   Procedure: COLONOSCOPY WITH PROPOFOL ;  Surgeon: [name] Salaam, MD;  Location: [facility] ENDOSCOPY;  Service: Gastroenterology;  Laterality: N/A;   ESOPHAGOGASTRODUODENOSCOPY (EGD) WITH PROPOFOL  N/A 05/11/2022   Procedure: ESOPHAGOGASTRODUODENOSCOPY (EGD) WITH PROPOFOL ;  Surgeon: [name] Salaam, MD;  Location: [facility] ENDOSCOPY;  Service: Gastroenterology;  Laterality: N/A;   EXPLORATORY LAPAROTOMY     IUD REMOVAL  07/25/2017   Procedure: INTRAUTERINE DEVICE (IUD) REMOVAL;  Surgeon: Zenobia Hila, MD;  Location: [facility] ORS;  Service: Gynecology;;   LAPAROSCOPIC ASSISTED VAGINAL HYSTERECTOMY Bilateral 07/25/2017   Procedure: LAPAROSCOPIC ASSISTED VAGINAL HYSTERECTOMY WITH BILATERAL SALPING OOPHERECTOMY;  Surgeon: Zenobia Hila, MD;  Location: [facility] ORS;  Service: Gynecology;  Laterality: Bilateral;   SHOULDER ARTHROSCOPY WITH OPEN ROTATOR CUFF REPAIR AND DISTAL CLAVICLE ACROMINECTOMY Left 01/27/2023   Procedure: LEFT SHOULDER ARTHROSCOPY, DEBRIDEMENT, DISTAL CLAVICLE EXCISION, MINI OPEN ROTATOR CUFF TEAR REPAIR;  Surgeon: Jasmine Mesi, MD;  Location: [facility] OR;  Service: Orthopedics;  Laterality: Left;   TUBAL LIGATION      Family History  Problem Relation Age of Onset   Hypertension Mother    Stroke Mother    Cancer Father    Breast cancer Maternal Aunt    Hypertension Maternal Grandmother    Colon cancer Maternal Grandmother    Breast cancer Maternal Grandmother    Skin cancer  Maternal Grandmother    Hypertension Maternal Grandfather    Hypertension Paternal Grandmother    Diabetes Paternal Grandmother    Hypertension Paternal Grandfather    Diabetes Son 12       type 1   Breast cancer Other     Allergies  Allergen Reactions   Topamax  [Topiramate ]     "cant function"    Tramadol      Cant function with it    Gluten Meal Diarrhea and Nausea Only        Ondansetron  Other (See Comments)    Helps her nausea, but makes HA worse    Current Outpatient Medications on File Prior to Visit  Medication Sig Dispense Refill   acetaminophen -codeine  (TYLENOL  #3) 300-30 MG tablet Take 1 tablet by mouth at bedtime as needed for moderate pain (pain score 4-6). 15 tablet 0   ferrous sulfate  325 (65 FE) MG tablet Take 1 tablet (325 mg total) by mouth daily with breakfast. 30 tablet 2   prochlorperazine  (COMPAZINE ) 10 MG tablet Take 1 tablet (  10 mg total) by mouth every 6 (six) hours as needed for nausea or vomiting. 30 tablet 0   No current facility-administered medications on file prior to visit.    BP 122/84   Pulse 76   Temp 98.2 F (36.8 C) (Oral)   Ht 5' (1.524 m)   Wt 158 lb 2 oz (71.7 kg)   LMP  (LMP Unknown)   SpO2 95%   BMI 30.88 kg/m  Objective:   Physical Exam Cardiovascular:     Rate and Rhythm: Normal rate and regular rhythm.  Pulmonary:     Effort: Pulmonary effort is normal.     Breath sounds: Normal breath sounds.  Musculoskeletal:     Cervical back: Neck supple.  Skin:    General: Skin is warm and dry.  Neurological:     Mental Status: She is alert and oriented to person, place, and time.  Psychiatric:        Mood and Affect: Mood normal.           Assessment & Plan:  Gastroesophageal reflux disease, unspecified whether esophagitis present Assessment & Plan: Deteriorated.  Prescription for pantoprazole  corrected.  Resume pantoprazole  40 mg twice daily.  Orders: -     Pantoprazole  Sodium; Take 1 tablet (40 mg total) by  mouth 2 (two) times daily before a meal. for heartburn.  Dispense: 180 tablet; Refill: 1  Iron  deficiency anemia, unspecified iron  deficiency anemia type Assessment & Plan: Continue ferrous sulfate  325 mg daily. Repeat CBC pending today.  Recommended she reschedule with hematology as she missed her last appointment.  Orders: -     CBC  Sepsis secondary to UTI [facility]) Assessment & Plan: With recent hospitalization. Hospital notes, labs, imaging reviewed.  Complete ciprofloxacin  antibiotic as prescribed.  Work note provided for missed time this week.         Sharra Cayabyab K Ponciano Shealy, NP

## 2023-08-11 LAB — CBC
HCT: 31.2 % — ABNORMAL LOW (ref 36.0–46.0)
Hemoglobin: 9.5 g/dL — ABNORMAL LOW (ref 12.0–15.0)
MCHC: 30.3 g/dL (ref 30.0–36.0)
MCV: 79 fl (ref 78.0–100.0)
Platelets: 557 10*3/uL — ABNORMAL HIGH (ref 150.0–400.0)
RBC: 3.95 Mil/uL (ref 3.87–5.11)
RDW: 18.9 % — ABNORMAL HIGH (ref 11.5–15.5)
WBC: 3.4 10*3/uL — ABNORMAL LOW (ref 4.0–10.5)

## 2023-08-31 ENCOUNTER — Ambulatory Visit
Admission: RE | Admit: 2023-08-31 | Discharge: 2023-08-31 | Disposition: A | Discharge status: Home / Self Care | Source: Ambulatory Visit | Attending: Surgical | Admitting: Surgical

## 2023-08-31 ENCOUNTER — Encounter: Payer: Self-pay | Admitting: Internal Medicine

## 2023-08-31 ENCOUNTER — Encounter: Payer: Self-pay | Admitting: Surgical

## 2023-08-31 DIAGNOSIS — G8929 Other chronic pain: Secondary | ICD-10-CM

## 2023-08-31 DIAGNOSIS — M25511 Pain in right shoulder: Secondary | ICD-10-CM | POA: Diagnosis not present

## 2023-08-31 MED ORDER — IOPAMIDOL (ISOVUE-M 200) INJECTION 41%
10.0000 mL | Freq: Once | INTRAMUSCULAR | Status: AC
  Administered 2023-08-31: 10 mL via INTRA_ARTICULAR

## 2023-09-16 ENCOUNTER — Ambulatory Visit: Admitting: Orthopedic Surgery

## 2023-09-16 ENCOUNTER — Encounter: Payer: Self-pay | Admitting: Orthopedic Surgery

## 2023-09-16 ENCOUNTER — Encounter: Payer: Self-pay | Admitting: Internal Medicine

## 2023-09-16 DIAGNOSIS — M75122 Complete rotator cuff tear or rupture of left shoulder, not specified as traumatic: Secondary | ICD-10-CM | POA: Diagnosis not present

## 2023-09-16 NOTE — Progress Notes (Signed)
 Office Visit Note   Patient: [name]           Date of Birth: January 12, 1966           MRN: [id number] Visit Date: 09/16/2023 Requested by: Gabriel [name], NP [address] [place],  [place] [address] PCP: Gabriel [name], NP  Subjective: Chief Complaint  Patient presents with   Left Shoulder - Follow-up    HPI: [name] is a 58 y.o. female who presents to the office reporting left shoulder pain.  Patient had rotator cuff surgery 1024.  Was doing well until she was taking her shirt off she felt a pop in that left shoulder.  Since that time she has had an MRI scan.  She did have an atypical tear of her rotator cuff involving primarily the muscle tendon junction of the supraspinatus.  She has a sewing job.  She is back at work but is difficult for her.  She reports diminished forward flexion ability since this recurrent injury.  Taking Tylenol  3 as needed along with ibuprofen  and Goody's powders.  Sore at night is sleeping.  She was doing very well until this event happened prior to her MRI scan..                ROS: All systems reviewed are negative as they relate to the chief complaint within the history of present illness.  Patient denies fevers or chills.  Assessment & Plan: Visit Diagnoses:  1. Complete tear of left rotator cuff, unspecified whether traumatic     Plan: Impression is possible recurrent cuff tear on the left shoulder.  Subscap looks intact.  The rotator cuff repair in general looks pretty reasonable but there is some leakage of the dye into the subacromial space indicating at least a full-thickness perforation.  She is having some weakness.  In general I think her best option would be revision surgery with cuff mend augmentation.  Short of that she may require reverse replacement if that is not successful.  That would be down the road however.  No evidence of infection.  Risk and benefits of surgery are discussed.  She would have to be out at least 3 more  months which may or may not be compatible with her keeping her current job.  All questions answered.  Follow-Up Instructions: No follow-ups on file.   Orders:  No orders of the defined types were placed in this encounter.  No orders of the defined types were placed in this encounter.     Procedures: No procedures performed   Clinical Data: No additional findings.  Objective: Vital Signs: LMP  (LMP Unknown)   Physical Exam:  Constitutional: Patient appears well-developed HEENT:  Head: Normocephalic Eyes:EOM are normal Neck: Normal range of motion Cardiovascular: Normal rate Pulmonary/chest: Effort normal Neurologic: Patient is alert Skin: Skin is warm Psychiatric: Patient has normal mood and affect  Ortho Exam: Ortho exam demonstrates forward flexion abduction both just above 90 on the left-hand side.  That is a diminishment of motion compared to prior visits.  He has pretty reasonable subscap strength bilaterally.  External rotation strength also 5- out of 5 on the left compared to 5+ out of 5 on the right.  Does have some coarseness with passive range of motion which is a new finding.  Specialty Comments:  No specialty comments available.  Imaging: No results found.   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) 11/27/2021   Vitamin D  deficiency 11/27/2021   Chronic fatigue 11/27/2021   Paranoid  delusion (HCC) 11/27/2021   GERD (gastroesophageal reflux disease) 04/25/2019   Hyperlipidemia, mixed 03/02/2019   Benign essential HTN 02/18/2019   Recurrent cystitis 11/27/2012   Intractable migraine with status migrainosus 09/19/2011   Chronic constipation 06/25/2011   Past Medical History:  Diagnosis Date   Anemia    BV (bacterial vaginosis) 11/27/2012   Celiac disease    Cough due to ACE inhibitor 04/25/2019   Depression    Essential hypertension    Family history of adverse reaction to anesthesia    MOM-HARD TIME WAKING UP   GERD (gastroesophageal reflux disease)    Migraine with visual aura    MIGRAINES   UTI (lower urinary tract infection)     Family History  Problem Relation Age of Onset   Hypertension Mother    Stroke Mother    Cancer Father    Breast cancer Maternal Aunt    Hypertension Maternal Grandmother    Colon cancer Maternal Grandmother    Breast cancer Maternal Grandmother    Skin cancer Maternal Grandmother    Hypertension Maternal Grandfather    Hypertension Paternal Grandmother    Diabetes Paternal Grandmother    Hypertension Paternal Grandfather    Diabetes Son 12       type 1   Breast cancer Other     Past Surgical History:  Procedure Laterality Date   ABDOMINAL HYSTERECTOMY     BICEPT TENODESIS Left 01/27/2023   Procedure: BICEPS TENODESIS;  Surgeon: Jasmine Mesi, MD;  Location: [facility] OR;  Service: Orthopedics;  Laterality: Left;   BLADDER SUSPENSION     COLONOSCOPY WITH PROPOFOL  N/A 05/11/2022   Procedure: COLONOSCOPY WITH PROPOFOL ;  Surgeon: [name] Salaam, MD;  Location: [facility] ENDOSCOPY;  Service: Gastroenterology;  Laterality: N/A;   ESOPHAGOGASTRODUODENOSCOPY (EGD) WITH PROPOFOL  N/A 05/11/2022   Procedure: ESOPHAGOGASTRODUODENOSCOPY (EGD) WITH PROPOFOL ;  Surgeon: [name] Salaam, MD;  Location: [name] ENDOSCOPY;  Service: Gastroenterology;  Laterality: N/A;   EXPLORATORY LAPAROTOMY     IUD REMOVAL  07/25/2017   Procedure: INTRAUTERINE DEVICE  (IUD) REMOVAL;  Surgeon: Zenobia Hila, MD;  Location: [facility] ORS;  Service: Gynecology;;   LAPAROSCOPIC ASSISTED VAGINAL HYSTERECTOMY Bilateral 07/25/2017   Procedure: LAPAROSCOPIC ASSISTED VAGINAL HYSTERECTOMY WITH BILATERAL SALPING OOPHERECTOMY;  Surgeon: Zenobia Hila, MD;  Location: [facility] ORS;  Service: Gynecology;  Laterality: Bilateral;   SHOULDER ARTHROSCOPY WITH OPEN ROTATOR CUFF REPAIR AND DISTAL CLAVICLE ACROMINECTOMY Left 01/27/2023   Procedure: LEFT SHOULDER ARTHROSCOPY, DEBRIDEMENT, DISTAL CLAVICLE EXCISION, MINI OPEN ROTATOR CUFF TEAR REPAIR;  Surgeon: Jasmine Mesi, MD;  Location: [facility] OR;  Service: Orthopedics;  Laterality: Left;   TUBAL LIGATION     Social History   Occupational History   Occupation: Corporate treasurer  Tobacco Use   Smoking status: Never   Smokeless tobacco: Never  Vaping Use   Vaping status: Never Used  Substance and Sexual Activity  Alcohol use: No    Alcohol/week: 0.0 standard drinks of alcohol   Drug use: No   Sexual activity: Yes    Partners: Female    Birth control/protection: Surgical    Comment: INTERCOURSE AGE 42, SEXUAL PARTNERS LEES THAN 5

## 2023-09-30 ENCOUNTER — Telehealth: Payer: Self-pay | Admitting: Orthopedic Surgery

## 2023-09-30 NOTE — Telephone Encounter (Signed)
 Received

## 2023-09-30 NOTE — Telephone Encounter (Signed)
 Pt submitted medical release from, FMLA forms, and 20.00 cash payment 09/30/23

## 2023-10-10 NOTE — Pre-Procedure Instructions (Signed)
 Surgical Instructions   Your procedure is scheduled on October 18, 2023. Report to [facility] Main Entrance A at 8:50 A.M., then check in with the Admitting office. Any questions or running late day of surgery: call [phone]  Questions prior to your surgery date: call [phone], Monday-Friday, 8am-4pm. If you experience any cold or flu symptoms such as cough, fever, chills, shortness of breath, etc. between now and your scheduled surgery, please notify us  at the above number.     Remember:  Do not eat after midnight the night before your surgery   You may drink clear liquids until 7:50 AM the morning of your surgery.   Clear liquids allowed are: Water , Non-Citrus Juices (without pulp), Carbonated Beverages, Clear Tea (no milk, honey, etc.), Black Coffee Only (NO MILK, CREAM OR POWDERED CREAMER of any kind), and Gatorade.    Take these medicines the morning of surgery with A SIP OF WATER : NONE   One week prior to surgery, STOP taking any Aspirin  (unless otherwise instructed by your surgeon) Aleve , Naproxen , Ibuprofen , Motrin , Advil , Goody's, BC's, all herbal medications, fish oil, and non-prescription vitamins.                     Do NOT Smoke (Tobacco/Vaping) for 24 hours prior to your procedure.  If you use a CPAP at night, you may bring your mask/headgear for your overnight stay.   You will be asked to remove any contacts, glasses, piercing's, hearing aid's, dentures/partials prior to surgery. Please bring cases for these items if needed.    Patients discharged the day of surgery will not be allowed to drive home, and someone needs to stay with them for 24 hours.  SURGICAL WAITING ROOM VISITATION Patients may have no more than 2 support people in the waiting area - these visitors may rotate.   Pre-op nurse will coordinate an appropriate time for 1 ADULT support person, who may not rotate, to accompany patient in pre-op.  Children under the age of 80 must have an adult with  them who is not the patient and must remain in the main waiting area with an adult.  If the patient needs to stay at the hospital during part of their recovery, the visitor guidelines for inpatient rooms apply.  Please refer to the [facility] website for the visitor guidelines for any additional information.   If you received a COVID test during your pre-op visit  it is requested that you wear a mask when out in public, stay away from anyone that may not be feeling well and notify your surgeon if you develop symptoms. If you have been in contact with anyone that has tested positive in the last 10 days please notify you surgeon.      Pre-operative CHG Bathing Instructions   You can play a key role in reducing the risk of infection after surgery. Your skin needs to be as free of germs as possible. You can reduce the number of germs on your skin by washing with CHG (chlorhexidine  gluconate) soap before surgery. CHG is an antiseptic soap that kills germs and continues to kill germs even after washing.   DO NOT use if you have an allergy to chlorhexidine /CHG or antibacterial soaps. If your skin becomes reddened or irritated, stop using the CHG and notify one of our RNs at [phone].              TAKE A SHOWER THE NIGHT BEFORE SURGERY AND THE DAY OF SURGERY  Please keep in mind the following:  DO NOT shave, including legs and underarms, 48 hours prior to surgery.   You may shave your face before/day of surgery.  Place clean sheets on your bed the night before surgery Use a clean washcloth (not used since being washed) for each shower. DO NOT sleep with pet's night before surgery.  CHG Shower Instructions:  Wash your face and private area with normal soap. If you choose to wash your hair, wash first with your normal shampoo.  After you use shampoo/soap, rinse your hair and body thoroughly to remove shampoo/soap residue.  Turn the water  OFF and apply half the bottle of CHG soap to a CLEAN  washcloth.  Apply CHG soap ONLY FROM YOUR NECK DOWN TO YOUR TOES (washing for 3-5 minutes)  DO NOT use CHG soap on face, private areas, open wounds, or sores.  Pay special attention to the area where your surgery is being performed.  If you are having back surgery, having someone wash your back for you may be helpful. Wait 2 minutes after CHG soap is applied, then you may rinse off the CHG soap.  Pat dry with a clean towel  Put on clean pajamas    Additional instructions for the day of surgery: DO NOT APPLY any lotions, deodorants, cologne, or perfumes.   Do not wear jewelry or makeup Do not wear nail polish, gel polish, artificial nails, or any other type of covering on natural nails (fingers and toes) Do not bring valuables to the hospital. [facility] is not responsible for valuables/personal belongings. Put on clean/comfortable clothes.  Please brush your teeth.  Ask your nurse before applying any prescription medications to the skin.

## 2023-10-11 ENCOUNTER — Encounter (HOSPITAL_COMMUNITY): Payer: Self-pay

## 2023-10-11 ENCOUNTER — Encounter (HOSPITAL_COMMUNITY)
Admission: RE | Admit: 2023-10-11 | Discharge: 2023-10-11 | Disposition: A | Discharge status: Home / Self Care | Source: Ambulatory Visit | Attending: Orthopedic Surgery | Admitting: Orthopedic Surgery

## 2023-10-11 ENCOUNTER — Other Ambulatory Visit: Payer: Self-pay

## 2023-10-11 DIAGNOSIS — K449 Diaphragmatic hernia without obstruction or gangrene: Secondary | ICD-10-CM | POA: Diagnosis not present

## 2023-10-11 DIAGNOSIS — Z01818 Encounter for other preprocedural examination: Secondary | ICD-10-CM

## 2023-10-11 DIAGNOSIS — X58XXXA Exposure to other specified factors, initial encounter: Secondary | ICD-10-CM | POA: Insufficient documentation

## 2023-10-11 DIAGNOSIS — K219 Gastro-esophageal reflux disease without esophagitis: Secondary | ICD-10-CM | POA: Insufficient documentation

## 2023-10-11 DIAGNOSIS — Z01812 Encounter for preprocedural laboratory examination: Secondary | ICD-10-CM | POA: Diagnosis present

## 2023-10-11 DIAGNOSIS — S46012A Strain of muscle(s) and tendon(s) of the rotator cuff of left shoulder, initial encounter: Secondary | ICD-10-CM | POA: Diagnosis not present

## 2023-10-11 DIAGNOSIS — D649 Anemia, unspecified: Secondary | ICD-10-CM | POA: Insufficient documentation

## 2023-10-11 DIAGNOSIS — K900 Celiac disease: Secondary | ICD-10-CM | POA: Diagnosis not present

## 2023-10-11 DIAGNOSIS — I10 Essential (primary) hypertension: Secondary | ICD-10-CM | POA: Diagnosis not present

## 2023-10-11 DIAGNOSIS — R9431 Abnormal electrocardiogram [ECG] [EKG]: Secondary | ICD-10-CM | POA: Insufficient documentation

## 2023-10-11 LAB — BASIC METABOLIC PANEL WITH GFR
Anion gap: 8 (ref 5–15)
BUN: 15 mg/dL (ref 6–20)
CO2: 26 mmol/L (ref 22–32)
Calcium: 9 mg/dL (ref 8.9–10.3)
Chloride: 103 mmol/L (ref 98–111)
Creatinine, Ser: 0.63 mg/dL (ref 0.44–1.00)
GFR, Estimated: >60 mL/min (ref >60)
Glucose, Bld: 100 mg/dL — ABNORMAL HIGH (ref 70–99)
Potassium: 3.6 mmol/L (ref 3.5–5.1)
Sodium: 137 mmol/L (ref 135–145)

## 2023-10-11 LAB — CBC
HCT: 32.7 % — ABNORMAL LOW (ref 36.0–46.0)
Hemoglobin: 9.5 g/dL — ABNORMAL LOW (ref 12.0–15.0)
MCH: 24.5 pg — ABNORMAL LOW (ref 26.0–34.0)
MCHC: 29.1 g/dL — ABNORMAL LOW (ref 30.0–36.0)
MCV: 84.3 fL (ref 80.0–100.0)
Platelets: 302 10*3/uL (ref 150–400)
RBC: 3.88 MIL/uL (ref 3.87–5.11)
RDW: 19.4 % — ABNORMAL HIGH (ref 11.5–15.5)
WBC: 4.1 10*3/uL (ref 4.0–10.5)
nRBC: 0 % (ref 0.0–0.2)

## 2023-10-11 NOTE — Progress Notes (Signed)
 Patients chart sent to anesthesia for review due to hgb 9.5

## 2023-10-11 NOTE — Progress Notes (Signed)
 PCP - Comer Gaskins, NP Cardiologist - denies  PPM/ICD - denies   Chest x-ray - 08/01/23 EKG - 08/01/23 Stress Test - denies ECHO - denies Cardiac Cath - denies  Sleep Study - denies   DM- denies  Last dose of GLP1 agonist-  n/a   ASA/Blood Thinner Instructions: n/a   ERAS Protcol - clears until 0750 PRE-SURGERY Ensure given   COVID TEST- n/a   Anesthesia review: no  Patient denies shortness of breath, fever, cough and chest pain at PAT appointment   All instructions explained to the patient, with a verbal understanding of the material. Patient agrees to go over the instructions while at home for a better understanding. The opportunity to ask questions was provided.

## 2023-10-11 NOTE — Pre-Procedure Instructions (Addendum)
 Surgical Instructions   Your procedure is scheduled on October 18, 2023. Report to [facility] Main Entrance A at 8:50 A.M., then check in with the Admitting office. Any questions or running late day of surgery: call [phone]  Questions prior to your surgery date: call [phone], Monday-Friday, 8am-4pm. If you experience any cold or flu symptoms such as cough, fever, chills, shortness of breath, etc. between now and your scheduled surgery, please notify us  at the above number.     Remember:  Do not eat after midnight the night before your surgery   You may drink clear liquids until 7:50 AM the morning of your surgery.   Clear liquids allowed are: Water , Non-Citrus Juices (without pulp), Carbonated Beverages, Clear Tea (no milk, honey, etc.), Black Coffee Only (NO MILK, CREAM OR POWDERED CREAMER of any kind), and Gatorade.  Patient Instructions  The night before surgery:  No food after midnight. ONLY clear liquids after midnight  The day of surgery (if you do NOT have diabetes):  Drink ONE (1) Pre-Surgery Clear Ensure by 07:50 AM the morning of surgery. Drink in one sitting. Do not sip.  This drink was given to you during your hospital  pre-op appointment visit.  Nothing else to drink after completing the  Pre-Surgery Clear Ensure.          If you have questions, please contact your surgeon's office.    Take these medicines the morning of surgery with A SIP OF WATER : NONE   One week prior to surgery, STOP taking any Aspirin  (unless otherwise instructed by your surgeon) Aleve , Naproxen , Ibuprofen , Motrin , Advil , Goody's, BC's, all herbal medications, fish oil, and non-prescription vitamins.                     Do NOT Smoke (Tobacco/Vaping) for 24 hours prior to your procedure.  If you use a CPAP at night, you may bring your mask/headgear for your overnight stay.   You will be asked to remove any contacts, glasses, piercing's, hearing aid's, dentures/partials prior to  surgery. Please bring cases for these items if needed.    Patients discharged the day of surgery will not be allowed to drive home, and someone needs to stay with them for 24 hours.  SURGICAL WAITING ROOM VISITATION Patients may have no more than 2 support people in the waiting area - these visitors may rotate.   Pre-op nurse will coordinate an appropriate time for 1 ADULT support person, who may not rotate, to accompany patient in pre-op.  Children under the age of 41 must have an adult with them who is not the patient and must remain in the main waiting area with an adult.  If the patient needs to stay at the hospital during part of their recovery, the visitor guidelines for inpatient rooms apply.  Please refer to the [facility] website for the visitor guidelines for any additional information.   If you received a COVID test during your pre-op visit  it is requested that you wear a mask when out in public, stay away from anyone that may not be feeling well and notify your surgeon if you develop symptoms. If you have been in contact with anyone that has tested positive in the last 10 days please notify you surgeon.      Pre-operative CHG Bathing Instructions   You can play a key role in reducing the risk of infection after surgery. Your skin needs to be as free of germs as possible.  You can reduce the number of germs on your skin by washing with CHG (chlorhexidine  gluconate) soap before surgery. CHG is an antiseptic soap that kills germs and continues to kill germs even after washing.   DO NOT use if you have an allergy to chlorhexidine /CHG or antibacterial soaps. If your skin becomes reddened or irritated, stop using the CHG and notify one of our RNs at [phone].              TAKE A SHOWER THE NIGHT BEFORE SURGERY AND THE DAY OF SURGERY    Please keep in mind the following:  DO NOT shave, including legs and underarms, 48 hours prior to surgery.   You may shave your face before/day  of surgery.  Place clean sheets on your bed the night before surgery Use a clean washcloth (not used since being washed) for each shower. DO NOT sleep with pet's night before surgery.  CHG Shower Instructions:  Wash your face and private area with normal soap. If you choose to wash your hair, wash first with your normal shampoo.  After you use shampoo/soap, rinse your hair and body thoroughly to remove shampoo/soap residue.  Turn the water  OFF and apply half the bottle of CHG soap to a CLEAN washcloth.  Apply CHG soap ONLY FROM YOUR NECK DOWN TO YOUR TOES (washing for 3-5 minutes)  DO NOT use CHG soap on face, private areas, open wounds, or sores.  Pay special attention to the area where your surgery is being performed.  If you are having back surgery, having someone wash your back for you may be helpful. Wait 2 minutes after CHG soap is applied, then you may rinse off the CHG soap.  Pat dry with a clean towel  Put on clean pajamas    Additional instructions for the day of surgery: DO NOT APPLY any lotions, deodorants, cologne, or perfumes.   Do not wear jewelry or makeup Do not wear nail polish, gel polish, artificial nails, or any other type of covering on natural nails (fingers and toes) Do not bring valuables to the hospital. [facility] is not responsible for valuables/personal belongings. Put on clean/comfortable clothes.  Please brush your teeth.  Ask your nurse before applying any prescription medications to the skin.   [place]- Preparing for Total Shoulder Arthroplasty  Before surgery, you can play an important role. Because skin is not sterile, your skin needs to be as free of germs as possible. You can reduce the number of germs on your skin by using the following products.   Benzoyl Peroxide Gel  o Reduces the number of germs present on the skin  o Applied twice a day to shoulder area starting two days before surgery   Please follow these instructions  carefully:  BENZOYL PEROXIDE 5% GEL  Please do not use if you have an allergy to benzoyl peroxide. If your skin becomes reddened/irritated stop using the benzoyl peroxide.  Starting two days before surgery, apply as follows:  1. Apply benzoyl peroxide in the morning and at night. Apply after taking a shower. If you are not taking a shower clean entire shoulder front, back, and side along with the armpit with a clean wet washcloth.  2. Place a quarter-sized dollop on your SHOULDER and rub in thoroughly, making sure to cover the front, back, and side of your shoulder, along with the armpit.   2 Days prior to Surgery First Dose on _____________ Morning Second Dose on ______________ Night  Day  Before Surgery First Dose on ______________ Morning Night before surgery wash (entire body except face and private areas) with CHG Soap THEN Second Dose on ____________ Night   Morning of Surgery  wash BODY AGAIN with CHG Soap   4. Do NOT apply benzoyl peroxide gel on the day of surgery

## 2023-10-12 NOTE — Progress Notes (Signed)
 Anesthesia Chart Review:  Case: [id number] Date/Time: 10/18/23 1039   Procedures:      ARTHROSCOPY, SHOULDER WITH DEBRIDEMENT (Left) - left shoulder arthroscopy, revision rotator cuff tear repair, possible cuff mend     REPAIR, ROTATOR CUFF, OPEN (Left: Shoulder)   Anesthesia type: General   Diagnosis: Traumatic tear of left rotator cuff, unspecified tear extent, subsequent encounter [S46.012D]   Pre-op diagnosis: left shoulder recurrent rotator cuff tear   [facility]: [facility] ROOM 04 / [facility]   Surgeons: Addie Cordella Hamilton, MD       DISCUSSION: Patient is a 58 year old female scheduled for the above procedure.  History includes never smoker, GERD, celiac disease, migraines, anemia (followed by GI & HEM, s/p PRBC 08/02/23), HTN, hysterectomy (07/25/17), urosepsis (admission 07/2023).  10/10/24 lab results showed stable H/H of 9.5/32.7--essentially unchanged since 08/10/23. By notes, previously EGD in 04/2022 showed grade C esophagitis and large hiatal hernia. Colonoscopy during that same time revealed diverticulosis throughout the entire colon. She has received oral iron  and iron  infusions.    Anesthesia team to evaluate on the day of surgery.  VS: BP 126/87   Pulse 85   Temp 36.9 C   Resp 16   Ht 5' (1.524 m)   Wt 74.3 kg   LMP  (LMP Unknown)   SpO2 97%   BMI 31.99 kg/m   PROVIDERS: Gretta Comer POUR, NP is PCP  Ruel Kung, MD is GI Clista Bimler, MD is HEM - She is followed routinely by cardiology but had evaluation in 03/2019 by Hester Pin for chest pain and had a normal stress echo.   LABS: Preoperative labs noted.  See DISCUSSION. (all labs ordered are listed, but only abnormal results are displayed)  Labs Reviewed  CBC - Abnormal; Notable for the following components:      Result Value   Hemoglobin 9.5 (*)    HCT 32.7 (*)    MCH 24.5 (*)    MCHC 29.1 (*)    RDW 19.4 (*)    All other components within normal limits  BASIC METABOLIC PANEL WITH GFR - Abnormal;  Notable for the following components:   Glucose, Bld 100 (*)    All other components within normal limits     IMAGES: MRI Left Shoulder 08/31/23: IMPRESSION: There is extravasation of intra-articular contrast suggesting a full-thickness perforation of the rotator cuff. This may be secondary to the anterior supraspinatus tendon. No large retracted tear is appreciated. There is a delaminated component seen in the anterior supraspinatus muscle on sagittal image 5 through 7 with contrast entering the supraspinatus muscle. - Biceps tenodesis. - Likely prior distal clavicle resection.   1V PCXR 08/01/23: FINDINGS: Single frontal view of the chest demonstrates a stable cardiac silhouette. No acute airspace disease, effusion, or pneumothorax. Stable hiatal hernia. No acute bony abnormalities. IMPRESSION: 1. No acute intrathoracic process.    EKG: 08/01/23: Sinus rhythm Borderline T wave abnormalities Borderline prolonged QT interval  [QT/QTcB 400/495 ms] Confirmed by Elnor Savant ([id number]) on 08/01/2023 7:43:24 AM   CV: Stress Echo 03/29/19 ([facility]): INTERPRETATION  Normal Stress Echocardiogram  NORMAL RIGHT VENTRICULAR SYSTOLIC FUNCTION  MILD VALVULAR REGURGITATION (mild MR, mild TR, trivial PR)  NO VALVULAR STENOSIS NOTED   Past Medical History:  Diagnosis Date   Anemia    BV (bacterial vaginosis) 11/27/2012   Celiac disease    Cough due to ACE inhibitor 04/25/2019   Depression    Essential hypertension    Family history of adverse reaction to  anesthesia    MOM-HARD TIME WAKING UP   GERD (gastroesophageal reflux disease)    Migraine with visual aura    MIGRAINES   UTI (lower urinary tract infection)     Past Surgical History:  Procedure Laterality Date   ABDOMINAL HYSTERECTOMY     BICEPT TENODESIS Left 01/27/2023   Procedure: BICEPS TENODESIS;  Surgeon: Addie Cordella Hamilton, MD;  Location: [facility] OR;  Service: Orthopedics;  Laterality: Left;   BLADDER SUSPENSION      COLONOSCOPY WITH PROPOFOL  N/A 05/11/2022   Procedure: COLONOSCOPY WITH PROPOFOL ;  Surgeon: Therisa Bi, MD;  Location: [facility] ENDOSCOPY;  Service: Gastroenterology;  Laterality: N/A;   ESOPHAGOGASTRODUODENOSCOPY (EGD) WITH PROPOFOL  N/A 05/11/2022   Procedure: ESOPHAGOGASTRODUODENOSCOPY (EGD) WITH PROPOFOL ;  Surgeon: Therisa Bi, MD;  Location: [facility] ENDOSCOPY;  Service: Gastroenterology;  Laterality: N/A;   EXPLORATORY LAPAROTOMY     IUD REMOVAL  07/25/2017   Procedure: INTRAUTERINE DEVICE (IUD) REMOVAL;  Surgeon: Janit Alm Agent, MD;  Location: [facility] ORS;  Service: Gynecology;;   LAPAROSCOPIC ASSISTED VAGINAL HYSTERECTOMY Bilateral 07/25/2017   Procedure: LAPAROSCOPIC ASSISTED VAGINAL HYSTERECTOMY WITH BILATERAL SALPING OOPHERECTOMY;  Surgeon: Janit Alm Agent, MD;  Location: [facility] ORS;  Service: Gynecology;  Laterality: Bilateral;   SHOULDER ARTHROSCOPY WITH OPEN ROTATOR CUFF REPAIR AND DISTAL CLAVICLE ACROMINECTOMY Left 01/27/2023   Procedure: LEFT SHOULDER ARTHROSCOPY, DEBRIDEMENT, DISTAL CLAVICLE EXCISION, MINI OPEN ROTATOR CUFF TEAR REPAIR;  Surgeon: Addie Cordella Hamilton, MD;  Location: [facility] OR;  Service: Orthopedics;  Laterality: Left;   TUBAL LIGATION      MEDICATIONS:  acetaminophen -codeine  (TYLENOL  #3) 300-30 MG tablet   ferrous sulfate  325 (65 FE) MG tablet   pantoprazole  (PROTONIX ) 40 MG tablet   prochlorperazine  (COMPAZINE ) 10 MG tablet   No current facility-administered medications for this encounter.   Isaiah Ruder, PA-C Surgical Short Stay/Anesthesiology [name] Phone [phone] [facility] Phone [phone] 10/12/2023 2:16 PM

## 2023-10-12 NOTE — Anesthesia Preprocedure Evaluation (Addendum)
 Anesthesia Evaluation  Patient identified by MRN, date of birth, ID band Patient awake    Reviewed: Allergy & Precautions, NPO status , Patient's Chart, lab work & pertinent test results  History of Anesthesia Complications Negative for: history of anesthetic complications  Airway Mallampati: II  TM Distance: >3 FB Neck ROM: Full    Dental  (+) Dental Advisory Given, Chipped,    Pulmonary neg pulmonary ROS   Pulmonary exam normal        Cardiovascular hypertension (no longer on meds), Normal cardiovascular exam     Neuro/Psych  Headaches PSYCHIATRIC DISORDERS  Depression     Hx Paranoid delusion Vertigo     GI/Hepatic Neg liver ROS, hiatal hernia,GERD  Medicated and Controlled,, Celiac    Endo/Other   Obesity Pre-DM   Renal/GU negative Renal ROS     Musculoskeletal  (+) Arthritis ,    Abdominal   Peds  Hematology  (+) Blood dyscrasia, anemia   Anesthesia Other Findings   Reproductive/Obstetrics  s/p tubal ligation                               Anesthesia Physical Anesthesia Plan  ASA: 2  Anesthesia Plan: General   Post-op Pain Management: Tylenol  PO (pre-op)* and Regional block*   Induction: Intravenous  PONV Risk Score and Plan: 3 and Treatment may vary due to age or medical condition, Dexamethasone , Midazolam  and Diphenhydramine   Airway Management Planned: Oral ETT  Additional Equipment: None  Intra-op Plan:   Post-operative Plan: Extubation in OR  Informed Consent: I have reviewed the patients History and Physical, chart, labs and discussed the procedure including the risks, benefits and alternatives for the proposed anesthesia with the patient or authorized representative who has indicated his/her understanding and acceptance.     Dental advisory given  Plan Discussed with: CRNA and Anesthesiologist  Anesthesia Plan Comments: (PAT note written 10/12/2023  by Allison Zelenak, PA-C.  )         Anesthesia Quick Evaluation

## 2023-10-18 ENCOUNTER — Other Ambulatory Visit: Payer: Self-pay

## 2023-10-18 ENCOUNTER — Ambulatory Visit (HOSPITAL_COMMUNITY)
Admission: RE | Admit: 2023-10-18 | Discharge: 2023-10-18 | Disposition: A | Discharge status: Home / Self Care | Source: Ambulatory Visit | Attending: Orthopedic Surgery | Admitting: Orthopedic Surgery

## 2023-10-18 ENCOUNTER — Encounter (HOSPITAL_COMMUNITY)
Admission: RE | Disposition: A | Discharge status: Home / Self Care | Payer: Self-pay | Source: Ambulatory Visit | Attending: Orthopedic Surgery

## 2023-10-18 ENCOUNTER — Encounter (HOSPITAL_COMMUNITY): Payer: Self-pay | Admitting: Orthopedic Surgery

## 2023-10-18 ENCOUNTER — Ambulatory Visit (HOSPITAL_COMMUNITY): Admitting: Anesthesiology

## 2023-10-18 ENCOUNTER — Ambulatory Visit (HOSPITAL_COMMUNITY): Payer: Self-pay | Admitting: Vascular Surgery

## 2023-10-18 DIAGNOSIS — M7502 Adhesive capsulitis of left shoulder: Secondary | ICD-10-CM | POA: Insufficient documentation

## 2023-10-18 DIAGNOSIS — Z01818 Encounter for other preprocedural examination: Secondary | ICD-10-CM

## 2023-10-18 DIAGNOSIS — I10 Essential (primary) hypertension: Secondary | ICD-10-CM | POA: Insufficient documentation

## 2023-10-18 DIAGNOSIS — Z79899 Other long term (current) drug therapy: Secondary | ICD-10-CM | POA: Insufficient documentation

## 2023-10-18 DIAGNOSIS — K219 Gastro-esophageal reflux disease without esophagitis: Secondary | ICD-10-CM | POA: Diagnosis not present

## 2023-10-18 DIAGNOSIS — K900 Celiac disease: Secondary | ICD-10-CM | POA: Diagnosis not present

## 2023-10-18 DIAGNOSIS — Z5333 Arthroscopic surgical procedure converted to open procedure: Secondary | ICD-10-CM | POA: Diagnosis not present

## 2023-10-18 DIAGNOSIS — Z9851 Tubal ligation status: Secondary | ICD-10-CM | POA: Insufficient documentation

## 2023-10-18 DIAGNOSIS — M65912 Unspecified synovitis and tenosynovitis, left shoulder: Secondary | ICD-10-CM

## 2023-10-18 DIAGNOSIS — D649 Anemia, unspecified: Secondary | ICD-10-CM | POA: Insufficient documentation

## 2023-10-18 DIAGNOSIS — X58XXXA Exposure to other specified factors, initial encounter: Secondary | ICD-10-CM | POA: Diagnosis not present

## 2023-10-18 DIAGNOSIS — M75102 Unspecified rotator cuff tear or rupture of left shoulder, not specified as traumatic: Secondary | ICD-10-CM

## 2023-10-18 DIAGNOSIS — F32A Depression, unspecified: Secondary | ICD-10-CM | POA: Diagnosis not present

## 2023-10-18 DIAGNOSIS — K449 Diaphragmatic hernia without obstruction or gangrene: Secondary | ICD-10-CM | POA: Diagnosis not present

## 2023-10-18 DIAGNOSIS — S46012A Strain of muscle(s) and tendon(s) of the rotator cuff of left shoulder, initial encounter: Secondary | ICD-10-CM | POA: Insufficient documentation

## 2023-10-18 DIAGNOSIS — S46012D Strain of muscle(s) and tendon(s) of the rotator cuff of left shoulder, subsequent encounter: Secondary | ICD-10-CM

## 2023-10-18 HISTORY — PX: SHOULDER OPEN ROTATOR CUFF REPAIR: SHX2407

## 2023-10-18 HISTORY — PX: POSTERIOR LUMBAR FUSION 2 WITH HARDWARE REMOVAL: SHX7297

## 2023-10-18 SURGERY — ARTHROSCOPY, SHOULDER WITH DEBRIDEMENT
Anesthesia: General | Site: Shoulder | Laterality: Left

## 2023-10-18 MED ORDER — BUPIVACAINE LIPOSOME 1.3 % IJ SUSP
INTRAMUSCULAR | Status: AC
  Filled 2023-10-18: qty 10

## 2023-10-18 MED ORDER — MIDAZOLAM HCL 2 MG/2ML IJ SOLN
INTRAMUSCULAR | Status: AC
  Filled 2023-10-18: qty 2

## 2023-10-18 MED ORDER — PROPOFOL 10 MG/ML IV BOLUS
INTRAVENOUS | Status: AC
  Filled 2023-10-18: qty 20

## 2023-10-18 MED ORDER — FENTANYL CITRATE (PF) 250 MCG/5ML IJ SOLN
INTRAMUSCULAR | Status: DC | PRN
  Administered 2023-10-18: 50 ug via INTRAVENOUS

## 2023-10-18 MED ORDER — BUPIVACAINE HCL (PF) 0.5 % IJ SOLN
INTRAMUSCULAR | Status: DC | PRN
Start: 2023-10-18 — End: 2023-10-18
  Administered 2023-10-18: 15 mL via PERINEURAL

## 2023-10-18 MED ORDER — ROCURONIUM BROMIDE 10 MG/ML (PF) SYRINGE
PREFILLED_SYRINGE | INTRAVENOUS | Status: AC
  Filled 2023-10-18: qty 30

## 2023-10-18 MED ORDER — SODIUM CHLORIDE 0.9 % IV SOLN: 12.5000 mg | INTRAVENOUS | Status: DC | PRN

## 2023-10-18 MED ORDER — PHENYLEPHRINE 80 MCG/ML (10ML) SYRINGE FOR IV PUSH (FOR BLOOD PRESSURE SUPPORT)
PREFILLED_SYRINGE | INTRAVENOUS | Status: AC
  Filled 2023-10-18: qty 10

## 2023-10-18 MED ORDER — PROPOFOL 10 MG/ML IV BOLUS
INTRAVENOUS | Status: AC
Start: 2023-10-18 — End: 2023-10-18
  Filled 2023-10-18: qty 40

## 2023-10-18 MED ORDER — FENTANYL CITRATE (PF) 100 MCG/2ML IJ SOLN: 50.0000 ug | Freq: Once | INTRAMUSCULAR | Status: AC

## 2023-10-18 MED ORDER — FENTANYL CITRATE (PF) 100 MCG/2ML IJ SOLN: 25.0000 ug | INTRAMUSCULAR | Status: DC | PRN

## 2023-10-18 MED ORDER — ACETAMINOPHEN 500 MG PO TABS
1000.0000 mg | ORAL_TABLET | Freq: Once | ORAL | Status: AC
  Administered 2023-10-18: 1000 mg via ORAL
  Filled 2023-10-18: qty 2

## 2023-10-18 MED ORDER — BUPIVACAINE LIPOSOME 1.3 % IJ SUSP
INTRAMUSCULAR | Status: DC | PRN
  Administered 2023-10-18: 10 mL via PERINEURAL

## 2023-10-18 MED ORDER — VANCOMYCIN HCL 500 MG IV SOLR
INTRAVENOUS | Status: DC | PRN
  Administered 2023-10-18: 500 mg

## 2023-10-18 MED ORDER — ACETAMINOPHEN 500 MG PO TABS
1000.0000 mg | ORAL_TABLET | Freq: Four times a day (QID) | ORAL | Status: DC | PRN
  Administered 2023-10-18: 1000 mg via ORAL

## 2023-10-18 MED ORDER — CELECOXIB 100 MG PO CAPS: 100.0000 mg | ORAL_CAPSULE | Freq: Every day | ORAL | 0 refills | Status: DC

## 2023-10-18 MED ORDER — FENTANYL CITRATE (PF) 100 MCG/2ML IJ SOLN
INTRAMUSCULAR | Status: AC
  Administered 2023-10-18: 50 ug via INTRAVENOUS
  Filled 2023-10-18: qty 2

## 2023-10-18 MED ORDER — 0.9 % SODIUM CHLORIDE (POUR BTL) OPTIME
TOPICAL | Status: DC | PRN
  Administered 2023-10-18: 1000 mL

## 2023-10-18 MED ORDER — SUGAMMADEX SODIUM 200 MG/2ML IV SOLN
INTRAVENOUS | Status: AC
  Filled 2023-10-18: qty 2

## 2023-10-18 MED ORDER — SODIUM CHLORIDE 0.9 % IR SOLN
Status: DC | PRN
  Administered 2023-10-18 (×2): 3000 mL

## 2023-10-18 MED ORDER — OXYCODONE HCL 5 MG PO TABS: 5.0000 mg | ORAL_TABLET | ORAL | 0 refills | Status: DC | PRN

## 2023-10-18 MED ORDER — METHOCARBAMOL 500 MG PO TABS: 500.0000 mg | ORAL_TABLET | Freq: Three times a day (TID) | ORAL | 0 refills | Status: DC | PRN

## 2023-10-18 MED ORDER — PHENYLEPHRINE 80 MCG/ML (10ML) SYRINGE FOR IV PUSH (FOR BLOOD PRESSURE SUPPORT)
PREFILLED_SYRINGE | INTRAVENOUS | Status: DC | PRN
  Administered 2023-10-18 (×3): 80 ug via INTRAVENOUS

## 2023-10-18 MED ORDER — LACTATED RINGERS IV SOLN: INTRAVENOUS | Status: DC

## 2023-10-18 MED ORDER — PROPOFOL 10 MG/ML IV BOLUS
INTRAVENOUS | Status: DC | PRN
  Administered 2023-10-18: 200 mg via INTRAVENOUS
  Administered 2023-10-18: 50 mg via INTRAVENOUS

## 2023-10-18 MED ORDER — CHLORHEXIDINE GLUCONATE 0.12 % MT SOLN
15.0000 mL | Freq: Once | OROMUCOSAL | Status: AC
  Administered 2023-10-18: 15 mL via OROMUCOSAL

## 2023-10-18 MED ORDER — FENTANYL CITRATE (PF) 250 MCG/5ML IJ SOLN
INTRAMUSCULAR | Status: AC
  Filled 2023-10-18: qty 5

## 2023-10-18 MED ORDER — CEFAZOLIN SODIUM-DEXTROSE 2-4 GM/100ML-% IV SOLN
2.0000 g | INTRAVENOUS | Status: AC
  Administered 2023-10-18: 2 g via INTRAVENOUS
  Filled 2023-10-18: qty 100

## 2023-10-18 MED ORDER — LIDOCAINE 2% (20 MG/ML) 5 ML SYRINGE
INTRAMUSCULAR | Status: DC | PRN
  Administered 2023-10-18: 40 mg via INTRAVENOUS

## 2023-10-18 MED ORDER — OXYCODONE HCL 5 MG/5ML PO SOLN: 5.0000 mg | Freq: Once | ORAL | Status: DC | PRN

## 2023-10-18 MED ORDER — PHENYLEPHRINE HCL-NACL 20-0.9 MG/250ML-% IV SOLN
INTRAVENOUS | Status: DC | PRN
  Administered 2023-10-18: 40 ug/min via INTRAVENOUS

## 2023-10-18 MED ORDER — MIDAZOLAM HCL 2 MG/2ML IJ SOLN
INTRAMUSCULAR | Status: AC
  Administered 2023-10-18: 1 mg via INTRAVENOUS
  Filled 2023-10-18: qty 2

## 2023-10-18 MED ORDER — AMISULPRIDE (ANTIEMETIC) 5 MG/2ML IV SOLN: 10.0000 mg | Freq: Once | INTRAVENOUS | Status: DC | PRN

## 2023-10-18 MED ORDER — DEXAMETHASONE SODIUM PHOSPHATE 10 MG/ML IJ SOLN
INTRAMUSCULAR | Status: DC | PRN
  Administered 2023-10-18: 5 mg via INTRAVENOUS

## 2023-10-18 MED ORDER — DEXAMETHASONE SODIUM PHOSPHATE 10 MG/ML IJ SOLN
INTRAMUSCULAR | Status: AC
Start: 2023-10-18 — End: 2023-10-18
  Filled 2023-10-18: qty 1

## 2023-10-18 MED ORDER — VANCOMYCIN HCL 500 MG IV SOLR
INTRAVENOUS | Status: AC
Start: 2023-10-18 — End: 2023-10-18
  Filled 2023-10-18: qty 10

## 2023-10-18 MED ORDER — ORAL CARE MOUTH RINSE
15.0000 mL | Freq: Once | OROMUCOSAL | Status: AC
Start: 2023-10-18 — End: 2023-10-18

## 2023-10-18 MED ORDER — TRANEXAMIC ACID-NACL 1000-0.7 MG/100ML-% IV SOLN
1000.0000 mg | INTRAVENOUS | Status: AC
  Administered 2023-10-18: 1000 mg via INTRAVENOUS
  Filled 2023-10-18: qty 100

## 2023-10-18 MED ORDER — MIDAZOLAM HCL 2 MG/2ML IJ SOLN: 1.0000 mg | Freq: Once | INTRAMUSCULAR | Status: AC

## 2023-10-18 MED ORDER — OXYCODONE HCL 5 MG PO TABS: 5.0000 mg | ORAL_TABLET | Freq: Once | ORAL | Status: DC | PRN

## 2023-10-18 MED ORDER — CHLORHEXIDINE GLUCONATE 0.12 % MT SOLN
OROMUCOSAL | Status: AC
  Filled 2023-10-18: qty 15

## 2023-10-18 MED ORDER — SUGAMMADEX SODIUM 200 MG/2ML IV SOLN
INTRAVENOUS | Status: DC | PRN
  Administered 2023-10-18 (×3): 50 mg via INTRAVENOUS

## 2023-10-18 MED ORDER — ACETAMINOPHEN 500 MG PO TABS
ORAL_TABLET | ORAL | Status: AC
  Filled 2023-10-18: qty 2

## 2023-10-18 MED ORDER — ROCURONIUM BROMIDE 10 MG/ML (PF) SYRINGE
PREFILLED_SYRINGE | INTRAVENOUS | Status: DC | PRN
  Administered 2023-10-18: 50 mg via INTRAVENOUS

## 2023-10-18 SURGICAL SUPPLY — 58 items
ANCHOR SUT 1.8 FIBERTAK SB KL (Anchor) IMPLANT
BAG COUNTER SPONGE SURGICOUNT (BAG) ×1 IMPLANT
BENZOIN TINCTURE PRP APPL 2/3 (GAUZE/BANDAGES/DRESSINGS) ×1 IMPLANT
BLADE SHAVER TORPEDO 4X13 (MISCELLANEOUS) IMPLANT
BLADE SURG 11 STRL SS (BLADE) IMPLANT
BLADE SURG 15 STRL LF DISP TIS (BLADE) IMPLANT
COVER SURGICAL LIGHT HANDLE (MISCELLANEOUS) ×1 IMPLANT
DRAPE IMP U-DRAPE 54X76 (DRAPES) ×1 IMPLANT
DRAPE INCISE IOBAN 66X45 STRL (DRAPES) ×1 IMPLANT
DRAPE STERI 35X30 U-POUCH (DRAPES) IMPLANT
DRAPE SURG ORHT 6 SPLT 77X108 (DRAPES) ×1 IMPLANT
DRAPE U-SHAPE 47X51 STRL (DRAPES) ×1 IMPLANT
DRSG TEGADERM 4X4.75 (GAUZE/BANDAGES/DRESSINGS) IMPLANT
DRSG XEROFORM 1X8 (GAUZE/BANDAGES/DRESSINGS) IMPLANT
DURAPREP 26ML APPLICATOR (WOUND CARE) ×1 IMPLANT
ELECT CAUTERY BLADE 6.4 (BLADE) ×1 IMPLANT
ELECTRODE REM PT RTRN 9FT ADLT (ELECTROSURGICAL) ×1 IMPLANT
GAUZE PAD ABD 8X10 STRL (GAUZE/BANDAGES/DRESSINGS) ×1 IMPLANT
GAUZE SPONGE 4X4 12PLY STRL (GAUZE/BANDAGES/DRESSINGS) ×1 IMPLANT
GAUZE XEROFORM 1X8 LF (GAUZE/BANDAGES/DRESSINGS) ×1 IMPLANT
GLOVE BIOGEL PI IND STRL 8 (GLOVE) ×1 IMPLANT
GLOVE ECLIPSE 8.0 STRL XLNG CF (GLOVE) ×1 IMPLANT
GOWN STRL REUS W/ TWL LRG LVL3 (GOWN DISPOSABLE) ×2 IMPLANT
GRAFT TISS 20X25 1 THK DERM (Tissue) IMPLANT
KIT BASIN OR (CUSTOM PROCEDURE TRAY) ×1 IMPLANT
KIT STR SPEAR 1.8 FBRTK DISP (KITS) IMPLANT
KIT TURNOVER KIT B (KITS) ×1 IMPLANT
MANIFOLD NEPTUNE II (INSTRUMENTS) ×1 IMPLANT
NDL 1/2 CIR CATGUT .05X1.09 (NEEDLE) IMPLANT
NDL HYPO 25GX1X1/2 BEV (NEEDLE) ×1 IMPLANT
NEEDLE 1/2 CIR CATGUT .05X1.09 (NEEDLE) IMPLANT
NEEDLE HYPO 25GX1X1/2 BEV (NEEDLE) ×1 IMPLANT
NS IRRIG 1000ML POUR BTL (IV SOLUTION) ×1 IMPLANT
PACK SHOULDER (CUSTOM PROCEDURE TRAY) ×1 IMPLANT
PACK UNIVERSAL I (CUSTOM PROCEDURE TRAY) ×1 IMPLANT
PAD ARMBOARD POSITIONER FOAM (MISCELLANEOUS) ×2 IMPLANT
PASSER SUT SWANSON 36MM LOOP (INSTRUMENTS) IMPLANT
SLING ARM FOAM STRAP LRG (SOFTGOODS) IMPLANT
SPIKE FLUID TRANSFER (MISCELLANEOUS) IMPLANT
SPONGE T-LAP 4X18 ~~LOC~~+RFID (SPONGE) ×2 IMPLANT
STAPLER SKIN PROX 35W (STAPLE) ×1 IMPLANT
SUCTION TUBE FRAZIER 10FR DISP (SUCTIONS) ×1 IMPLANT
SUT ETHILON 3 0 PS 1 (SUTURE) IMPLANT
SUT MNCRL AB 3-0 PS2 27 (SUTURE) IMPLANT
SUT VIC AB 0 CT1 27XBRD ANBCTR (SUTURE) ×1 IMPLANT
SUT VIC AB 0 CT1 36 (SUTURE) IMPLANT
SUT VIC AB 1 CT1 27XBRD ANBCTR (SUTURE) IMPLANT
SUT VIC AB 1 CTX 27 (SUTURE) ×1 IMPLANT
SUT VIC AB 2-0 CT2 27 (SUTURE) ×1 IMPLANT
SUTURE FIBERWR #2 38 T-5 BLUE (SUTURE) ×3 IMPLANT
SUTURE FIBERWR 2-0 18 17.9 3/8 (SUTURE) IMPLANT
SYR CONTROL 10ML LL (SYRINGE) ×1 IMPLANT
TISSUE ARTHROFLEX THICK 4 (Tissue) ×1 IMPLANT
TOWEL GREEN STERILE (TOWEL DISPOSABLE) ×1 IMPLANT
TOWEL GREEN STERILE FF (TOWEL DISPOSABLE) ×1 IMPLANT
TRAY FOLEY MTR SLVR 16FR STAT (SET/KITS/TRAYS/PACK) IMPLANT
WAND ABLATOR APOLLO I90 (BUR) IMPLANT
WATER STERILE IRR 1000ML POUR (IV SOLUTION) ×1 IMPLANT

## 2023-10-18 NOTE — H&P (Signed)
 [name] is an 58 y.o. female.   Chief Complaint: Left shoulder pain. HPI:  [name] is a 58 y.o. female who presents to the office reporting left shoulder pain.  Patient had rotator cuff surgery 10/24.  Was doing well until she was taking her shirt off she felt a pop in that left shoulder.  Since that time she has had an MRI scan.  She initially did have an atypical tear of her rotator cuff involving primarily the muscle tendon junction of the supraspinatus.  She has a sewing job.  She is back at work but is difficult for her.  She reports diminished forward flexion ability since this recurrent injury.  Taking Tylenol  3 as needed along with ibuprofen  and Goody's powders.  Sore at night is sleeping.  She was doing very well until this event happened prior to her MRI scan..   Past Medical History:  Diagnosis Date   Anemia    BV (bacterial vaginosis) 11/27/2012   Celiac disease    Cough due to ACE inhibitor 04/25/2019   Depression    Essential hypertension    Family history of adverse reaction to anesthesia    MOM-HARD TIME WAKING UP   GERD (gastroesophageal reflux disease)    Migraine with visual aura    MIGRAINES   UTI (lower urinary tract infection)     Past Surgical History:  Procedure Laterality Date   ABDOMINAL HYSTERECTOMY     BICEPT TENODESIS Left 01/27/2023   Procedure: BICEPS TENODESIS;  Surgeon: Addie Cordella Hamilton, MD;  Location: [facility] OR;  Service: Orthopedics;  Laterality: Left;   BLADDER SUSPENSION     COLONOSCOPY WITH PROPOFOL  N/A 05/11/2022   Procedure: COLONOSCOPY WITH PROPOFOL ;  Surgeon: Therisa Bi, MD;  Location: [facility] ENDOSCOPY;  Service: Gastroenterology;  Laterality: N/A;   ESOPHAGOGASTRODUODENOSCOPY (EGD) WITH PROPOFOL  N/A 05/11/2022   Procedure: ESOPHAGOGASTRODUODENOSCOPY (EGD) WITH PROPOFOL ;  Surgeon: Therisa Bi, MD;  Location: [facility] ENDOSCOPY;  Service: Gastroenterology;  Laterality: N/A;   EXPLORATORY LAPAROTOMY     IUD REMOVAL  07/25/2017   Procedure:  INTRAUTERINE DEVICE (IUD) REMOVAL;  Surgeon: Janit Alm Agent, MD;  Location: [facility] ORS;  Service: Gynecology;;   LAPAROSCOPIC ASSISTED VAGINAL HYSTERECTOMY Bilateral 07/25/2017   Procedure: LAPAROSCOPIC ASSISTED VAGINAL HYSTERECTOMY WITH BILATERAL SALPING OOPHERECTOMY;  Surgeon: Janit Alm Agent, MD;  Location: [facility] ORS;  Service: Gynecology;  Laterality: Bilateral;   SHOULDER ARTHROSCOPY WITH OPEN ROTATOR CUFF REPAIR AND DISTAL CLAVICLE ACROMINECTOMY Left 01/27/2023   Procedure: LEFT SHOULDER ARTHROSCOPY, DEBRIDEMENT, DISTAL CLAVICLE EXCISION, MINI OPEN ROTATOR CUFF TEAR REPAIR;  Surgeon: Addie Cordella Hamilton, MD;  Location: [facility] OR;  Service: Orthopedics;  Laterality: Left;   TUBAL LIGATION      Family History  Problem Relation Age of Onset   Hypertension Mother    Stroke Mother    Cancer Father    Breast cancer Maternal Aunt    Hypertension Maternal Grandmother    Colon cancer Maternal Grandmother    Breast cancer Maternal Grandmother    Skin cancer Maternal Grandmother    Hypertension Maternal Grandfather    Hypertension Paternal Grandmother    Diabetes Paternal Grandmother    Hypertension Paternal Grandfather    Diabetes Son 12       type 1   Breast cancer Other    Social History:  reports that she has never smoked. She has never used smokeless tobacco. She reports that she does not drink alcohol and does not use drugs.  Allergies:  Allergies  Allergen Reactions  Topamax  [Topiramate ]     cant function    Tramadol      Cant function with it    Gluten Meal Diarrhea and Nausea Only        Ondansetron  Other (See Comments)    Helps her nausea, but makes HA worse    Medications Prior to Admission  Medication Sig Dispense Refill   ferrous sulfate  325 (65 FE) MG tablet Take 1 tablet (325 mg total) by mouth daily with breakfast. 30 tablet 2   omeprazole  (PRILOSEC) 20 MG capsule Take 20 mg by mouth daily.     acetaminophen -codeine  (TYLENOL  #3) 300-30 MG tablet Take 1  tablet by mouth at bedtime as needed for moderate pain (pain score 4-6). (Patient not taking: Reported on 10/06/2023) 15 tablet 0   pantoprazole  (PROTONIX ) 40 MG tablet Take 1 tablet (40 mg total) by mouth 2 (two) times daily before a meal. for heartburn. (Patient not taking: Reported on 10/06/2023) 180 tablet 1   prochlorperazine  (COMPAZINE ) 10 MG tablet Take 1 tablet (10 mg total) by mouth every 6 (six) hours as needed for nausea or vomiting. (Patient not taking: Reported on 10/06/2023) 30 tablet 0    No results found for this or any previous visit (from the past 48 hours). No results found.  Review of Systems  Musculoskeletal:  Positive for arthralgias.  All other systems reviewed and are negative.   Blood pressure 130/75, pulse 86, temperature 98.3 F (36.8 C), temperature source Oral, resp. rate 18, height 5' (1.524 m), weight 73.9 kg, SpO2 96%. Physical Exam Vitals reviewed.  HENT:     Head: Normocephalic.     Nose: Nose normal.     Mouth/Throat:     Mouth: Mucous membranes are moist.  Eyes:     Pupils: Pupils are equal, round, and reactive to light.  Cardiovascular:     Rate and Rhythm: Normal rate.     Pulses: Normal pulses.  Pulmonary:     Effort: Pulmonary effort is normal.  Abdominal:     General: Abdomen is flat.  Musculoskeletal:     Cervical back: Normal range of motion.  Skin:    General: Skin is warm.     Capillary Refill: Capillary refill takes less than 2 seconds.  Neurological:     General: No focal deficit present.     Mental Status: She is alert.  Psychiatric:        Mood and Affect: Mood normal.     Ortho exam left shoulder demonstrates forward flexion abduction both just above 90 on the left-hand side.  That is a diminishment of motion compared to prior visits.  He has pretty reasonable subscap strength bilaterally.  External rotation strength also 5- out of 5 on the left compared to 5+ out of 5 on the right.  Does have some coarseness with passive range  of motion which is a new finding.   Assessment/Plan Impression is possible recurrent cuff tear on the left shoulder. On mri Subscap looks intact. The rotator cuff repair in general looks pretty reasonable but there is some leakage of the dye into the subacromial space indicating at least a full-thickness perforation. She is having some weakness. In general I think her best option would be revision surgery with cuff mend augmentation. Short of that she may require reverse replacement if that is not successful. That would be down the road however. No evidence of infection. Risk and benefits of surgery are discussed. She would have to be out at least  3 more months which may or may not be compatible with her keeping her current job. All questions answered.   KANDICE Glendia Hutchinson, MD 10/18/2023, 10:52 AM

## 2023-10-18 NOTE — Op Note (Signed)
 NAME: [name], [name] MEDICAL RECORD NO: [id number] ACCOUNT NO: [id number] DATE OF BIRTH: May 21, 1965 FACILITY: [facility] LOCATION: [facility] PHYSICIAN: Cordella RAMAN. Addie, MD  Operative Report   DATE OF PROCEDURE: 10/18/2023  PREOPERATIVE DIAGNOSIS:  Left shoulder rotator cuff tear.  POSTOPERATIVE DIAGNOSIS:  Left shoulder punctate rotator cuff tear with early adhesive capsulitis and synovitis.  PROCEDURE:  Left shoulder arthroscopy with debridement of synovitis and rotator cuff fraying with open revision rotator cuff tear repair using CuffMend for punctate hole in the rotator cuff.  SURGEON:  Cordella RAMAN. Addie, MD.  ASSISTANT:  April Landy, RNFA.  INDICATIONS:  The patient is a 58 year old patient who underwent rotator cuff tear repair of an atypical tear in 01/2023.  She was doing well until she felt a pop and developed crepitus and weakness in that left shoulder.  MRI scanning at that time  demonstrated punctate fluid progressing from the glenohumeral joint into the subacromial space.  The patient presents now for operative management after explanation of risks and benefits.  DESCRIPTION OF PROCEDURE:  The patient was brought to the operating room where general endotracheal anesthesia was induced.  Preoperative antibiotics were administered.  Timeout was called.  Left shoulder was prescrubbed with alcohol and Betadine,  allowed air to dry and prepped with DuraPrep solution and draped in a sterile manner.  Ioban was used to seal the operative field.  The head was in neutral position.  After calling timeout, the shoulder was examined under anesthesia.  Had range of motion  of about 40/85/130.  Was able to manipulate her into about 170 of forward flexion.  gh abduction also increased with manipulation to about 105.  At this time, posterior portal was created.  Anterior portal was created under direct visualization.  There  was some synovitis throughout the shoulder joint.  This was debrided using a  shaver and ArthroCare wand.  The glenohumeral articular surface is intact.  Anterior, inferior, and posterior inferior glenohumeral ligaments are intact.  The rotator cuff  repair actually looked reasonable at its attachment site along with in its mid portion.  This was where the patient had a tear at the muscle-tendon junction.  No overt full-thickness tear was visualized from the articular side.  After debriding the  rotator cuff fraying along with synovitis above the glenoid, the scope was placed in the subacromial space.  Meticulous evaluation of the rotator cuff was performed.  There was some fraying visible.  Bursectomy was performed.  One of the sutures was  visible from the side-to-side closure performed.  At this time, the arm was manipulated and there was still crepitus present and the decision was made to go ahead and open up the shoulder in order to more fully evaluate the rotator cuff integrity.  The  Ioban was used to cover the operative field.  An incision was made through the prior incision.  The deltoid was split between the anterior middle raphe, marked with a #1 Vicryl suture 4 cm from the anterolateral margin of the acromion.  Bursectomy was  completed.  The subacromial space was well decompressed.  There was a small punctate hole in the rotator cuff, but in general, the rotator cuff tissue was degenerative.  Had a fair amount of degeneration and tendinosis.  Decision was made at this time to  use the CuffMend patch sutured medially with 2-0 FiberWire suture x 4 and then brought down anterior and posterior under tension with further sutures covering this entire area of the prior rotator cuff repair,  then anchored.  This was anchored to the  metaphysis of the humerus using three Arthrex 1.8 knotless SutureTaks.  This gave a very nice repair after suturing.  We did use a suture to close up that small hole prior to putting on the patch.  The patch covered the area nicely and had no  impingement  and very smooth range of motion.  At this time, thorough irrigation was performed.  Vancomycin  powder was placed.  No evidence of infection within the joint.  Deltoid split was closed using #1 Vicryl suture followed by interrupted running 0-Vicryl  suture, 2-0 Vicryl suture and 3-0 Monocryl with Steri-Strips applied.  The patient tolerated the procedure well without immediate complications, transferred to the recovery room in stable condition.   MUK D: 10/18/2023 3:30:10 pm T: 10/18/2023 10:58:00 pm  JOB: [id number]/ [id number]

## 2023-10-18 NOTE — Anesthesia Procedure Notes (Signed)
 Anesthesia Regional Block: Interscalene brachial plexus block   Pre-Anesthetic Checklist: , timeout performed,  Correct Patient, Correct Site, Correct Laterality,  Correct Procedure, Correct Position, site marked,  Risks and benefits discussed,  Surgical consent,  Pre-op evaluation,  At surgeon's request and post-op pain management  Laterality: Left  Prep: chloraprep       Needles:  Injection technique: Single-shot  Needle Type: Echogenic Needle     Needle Length: 5cm  Needle Gauge: 21     Additional Needles:   Narrative:  Start time: 10/18/2023 10:57 AM End time: 10/18/2023 11:00 AM Injection made incrementally with aspirations every 5 mL.  Performed by: Personally  Anesthesiologist: Lucious Debby BRAVO, MD  Additional Notes: No pain on injection. No increased resistance to injection. Injection made in 5cc increments. Good needle visualization. Patient tolerated the procedure well.

## 2023-10-18 NOTE — Anesthesia Procedure Notes (Addendum)
 Procedure Name: Intubation Date/Time: 10/18/2023 11:34 AM  Performed by: Jolynn Mage, CRNAPre-anesthesia Checklist: Patient identified, Patient being monitored, Timeout performed, Emergency Drugs available and Suction available Patient Re-evaluated:Patient Re-evaluated prior to induction Oxygen Delivery Method: Circle system utilized Preoxygenation: Pre-oxygenation with 100% oxygen Induction Type: IV induction Ventilation: Mask ventilation without difficulty Laryngoscope Size: Mac and 3 Grade View: Grade I Tube type: Oral Tube size: 7.0 mm Number of attempts: 1 Airway Equipment and Method: Stylet Placement Confirmation: ETT inserted through vocal cords under direct vision, positive ETCO2 and breath sounds checked- equal and bilateral Secured at: 21 cm Tube secured with: Tape Dental Injury: Teeth and Oropharynx as per pre-operative assessment

## 2023-10-18 NOTE — Transfer of Care (Signed)
 Immediate Anesthesia Transfer of Care Note  Patient: [name]  Procedure(s) Performed: ARTHROSCOPY, SHOULDER WITH DEBRIDEMENT, LEFT (Left: Shoulder) REPAIR, ROTATOR CUFF, OPEN (Left: Shoulder)  Patient Location: PACU  Anesthesia Type:GA combined with regional for post-op pain  Level of Consciousness: drowsy and patient cooperative  Airway & Oxygen Therapy: Patient Spontanous Breathing and Patient connected to face mask oxygen  Post-op Assessment: Report given to RN and Post -op Vital signs reviewed and stable  Post vital signs: Reviewed and stable  Last Vitals:  Vitals Value Taken Time  BP 140/63 10/18/23 14:40  Temp    Pulse 80 10/18/23 14:43  Resp 20 10/18/23 14:43  SpO2 100 % 10/18/23 14:43  Vitals shown include unfiled device data.  Last Pain:  Vitals:   10/18/23 0917  TempSrc:   PainSc: 3       Patients Stated Pain Goal: 0 (10/18/23 0909)  Complications: There were no known notable events for this encounter.

## 2023-10-18 NOTE — Brief Op Note (Signed)
   10/18/2023  3:22 PM  PATIENT:  [name]  58 y.o. female  PRE-OPERATIVE DIAGNOSIS:  left shoulder recurrent rotator cuff tear  POST-OPERATIVE DIAGNOSIS:  left shoulder recurrent  punctate rotator cuff tear and early adhesive capsulitis  PROCEDURE:  Procedure(s): ARTHROSCOPY, SHOULDER WITH DEBRIDEMENT, LEFT revision REPAIR with  cuff mend , ROTATOR CUFF, OPEN  SURGEON:  Surgeon(s): Addie, Cordella Hamilton, MD  ASSISTANT: green rnfa  ANESTHESIA:   general  EBL: 15 ml    Total I/O In: 1100 [I.V.:900; IV Piggyback:200] Out: 35 [Blood:35]  BLOOD ADMINISTERED: none  DRAINS: none   LOCAL MEDICATIONS USED:  none  SPECIMEN:  No Specimen  COUNTS:  YES  TOURNIQUET:  * No tourniquets in log *  DICTATION: .Other Dictation: Dictation Number [id number]  PLAN OF CARE: Discharge to home after PACU  PATIENT DISPOSITION:  PACU - hemodynamically stable

## 2023-10-19 ENCOUNTER — Encounter (HOSPITAL_COMMUNITY): Payer: Self-pay | Admitting: Orthopedic Surgery

## 2023-10-20 NOTE — Anesthesia Postprocedure Evaluation (Signed)
 Anesthesia Post Note  Patient: [name]  Procedure(s) Performed: ARTHROSCOPY, SHOULDER WITH DEBRIDEMENT, LEFT (Left: Shoulder) REPAIR, ROTATOR CUFF, OPEN (Left: Shoulder)     Patient location during evaluation: PACU Anesthesia Type: General Level of consciousness: awake and alert Pain management: pain level controlled Vital Signs Assessment: post-procedure vital signs reviewed and stable Respiratory status: spontaneous breathing, nonlabored ventilation and respiratory function stable Cardiovascular status: stable and blood pressure returned to baseline Anesthetic complications: no   There were no known notable events for this encounter.  Last Vitals:  Vitals:   10/18/23 1500 10/18/23 1515  BP: 129/84 130/77  Pulse: 87 88  Resp: 17 20  Temp:  (!) 36.4 C  SpO2: 95% 93%    Last Pain:  Vitals:   10/18/23 1515  TempSrc:   PainSc: Asleep                 Debby FORBES Like

## 2023-10-26 ENCOUNTER — Ambulatory Visit (INDEPENDENT_AMBULATORY_CARE_PROVIDER_SITE_OTHER): Admitting: Orthopedic Surgery

## 2023-10-26 ENCOUNTER — Encounter: Payer: Self-pay | Admitting: Orthopedic Surgery

## 2023-10-26 DIAGNOSIS — M75122 Complete rotator cuff tear or rupture of left shoulder, not specified as traumatic: Secondary | ICD-10-CM

## 2023-10-26 MED ORDER — METHOCARBAMOL 500 MG PO TABS: 500.0000 mg | ORAL_TABLET | Freq: Three times a day (TID) | ORAL | 0 refills | Status: DC | PRN

## 2023-10-26 MED ORDER — OXYCODONE HCL 5 MG PO CAPS: 5.0000 mg | ORAL_CAPSULE | Freq: Three times a day (TID) | ORAL | 0 refills | Status: DC | PRN

## 2023-10-26 NOTE — Progress Notes (Signed)
 Post-Op Visit Note   Patient: [name]           Date of Birth: 1965-04-13           MRN: [id number] Visit Date: 10/26/2023 PCP: Gretta Comer POUR, NP   Assessment & Plan:  Chief Complaint:  Chief Complaint  Patient presents with   Left Shoulder - Routine Post Op    10/18/2023 left shoulder arthroscopy, debridement, open revision rotator cuff repair   Visit Diagnoses:  1. Complete tear of left rotator cuff, unspecified whether traumatic     Plan: The patient is now 1 week out left shoulder revision rotator cuff repair.  She had a small punctate hole in her previously repaired rotator cuff.  Atypical tear of the muscle tendon junction.  Patch applied.  She is doing pendulums.  Overall on exam she is a little limited in external rotation but no popping or grinding.  At this time would like her to start physical therapy here in 2 weeks for passive range of motion only for 3 weeks.  1 time a week for 3 weeks.  Okay to discontinue the sling in 2 weeks but no lifting after that.  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  3 return with Herlene for clinical recheck.  Really want to mostly check on range of motion at that time.  Anticipate early as 2 months more likely 30-month return following revision rotator cuff repair on that left shoulder.  Robaxin  and oxycodone  refilled.  Follow-Up Instructions: No follow-ups on file.   Orders:  Orders Placed This Encounter  Procedures   Ambulatory referral to Physical Therapy   Meds ordered this encounter  Medications   methocarbamol  (ROBAXIN ) 500 MG tablet    Sig: Take 1 tablet (500 mg total) by mouth every 8 (eight) hours as needed for muscle spasms.    Dispense:  30 tablet    Refill:  0    Imaging: No results found.  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) 11/27/2021   Vitamin D  deficiency 11/27/2021   Chronic fatigue 11/27/2021   Paranoid delusion (HCC) 11/27/2021   GERD (gastroesophageal reflux disease) 04/25/2019   Hyperlipidemia, mixed 03/02/2019   Benign essential HTN 02/18/2019   Recurrent cystitis 11/27/2012   Intractable migraine with status migrainosus 09/19/2011   Chronic constipation 06/25/2011   Past Medical History:  Diagnosis Date   Anemia    BV (bacterial vaginosis) 11/27/2012   Celiac disease    Cough due to ACE inhibitor 04/25/2019   Depression    Essential hypertension    Family history of adverse reaction to anesthesia    MOM-HARD TIME WAKING UP   GERD (gastroesophageal reflux disease)    Migraine with visual aura    MIGRAINES   UTI (lower urinary tract infection)     Family History  Problem Relation Age of Onset  Hypertension Mother    Stroke Mother    Cancer Father    Breast cancer Maternal Aunt    Hypertension Maternal Grandmother    Colon cancer Maternal Grandmother    Breast cancer Maternal Grandmother    Skin cancer Maternal Grandmother    Hypertension Maternal Grandfather    Hypertension Paternal Grandmother    Diabetes Paternal Grandmother    Hypertension Paternal Grandfather    Diabetes Son 70       type 1   Breast cancer Other     Past Surgical History:  Procedure Laterality Date   ABDOMINAL HYSTERECTOMY     BICEPT TENODESIS Left 01/27/2023   Procedure: BICEPS TENODESIS;  Surgeon: Addie Cordella Hamilton, MD;  Location: [facility] OR;  Service: Orthopedics;   Laterality: Left;   BLADDER SUSPENSION     COLONOSCOPY WITH PROPOFOL  N/A 05/11/2022   Procedure: COLONOSCOPY WITH PROPOFOL ;  Surgeon: Therisa Bi, MD;  Location: [facility] ENDOSCOPY;  Service: Gastroenterology;  Laterality: N/A;   ESOPHAGOGASTRODUODENOSCOPY (EGD) WITH PROPOFOL  N/A 05/11/2022   Procedure: ESOPHAGOGASTRODUODENOSCOPY (EGD) WITH PROPOFOL ;  Surgeon: Therisa Bi, MD;  Location: [facility] ENDOSCOPY;  Service: Gastroenterology;  Laterality: N/A;   EXPLORATORY LAPAROTOMY     IUD REMOVAL  07/25/2017   Procedure: INTRAUTERINE DEVICE (IUD) REMOVAL;  Surgeon: Janit Alm Agent, MD;  Location: [facility] ORS;  Service: Gynecology;;   LAPAROSCOPIC ASSISTED VAGINAL HYSTERECTOMY Bilateral 07/25/2017   Procedure: LAPAROSCOPIC ASSISTED VAGINAL HYSTERECTOMY WITH BILATERAL SALPING OOPHERECTOMY;  Surgeon: Janit Alm Agent, MD;  Location: [facility] ORS;  Service: Gynecology;  Laterality: Bilateral;   POSTERIOR LUMBAR FUSION 2 WITH HARDWARE REMOVAL Left 10/18/2023   Procedure: ARTHROSCOPY, SHOULDER WITH DEBRIDEMENT, LEFT;  Surgeon: Addie Cordella Hamilton, MD;  Location: [facility] OR;  Service: Orthopedics;  Laterality: Left;  left shoulder arthroscopy, revision rotator cuff tear repair, possible cuff mend   SHOULDER ARTHROSCOPY WITH OPEN ROTATOR CUFF REPAIR AND DISTAL CLAVICLE ACROMINECTOMY Left 01/27/2023   Procedure: LEFT SHOULDER ARTHROSCOPY, DEBRIDEMENT, DISTAL CLAVICLE EXCISION, MINI OPEN ROTATOR CUFF TEAR REPAIR;  Surgeon: Addie Cordella Hamilton, MD;  Location: [facility] OR;  Service: Orthopedics;  Laterality: Left;   SHOULDER OPEN ROTATOR CUFF REPAIR Left 10/18/2023   Procedure: REPAIR, ROTATOR CUFF, OPEN;  Surgeon: Addie Cordella Hamilton, MD;  Location: [facility] OR;  Service: Orthopedics;  Laterality: Left;   TUBAL LIGATION     Social History   Occupational History   Occupation: Corporate treasurer  Tobacco Use   Smoking status: Never   Smokeless tobacco: Never  Vaping Use   Vaping status: Never Used  Substance and Sexual  Activity   Alcohol use: No    Alcohol/week: 0.0 standard drinks of alcohol   Drug use: No   Sexual activity: Yes    Partners: Female    Birth control/protection: Surgical    Comment: INTERCOURSE AGE 40, SEXUAL PARTNERS LEES THAN 5

## 2023-10-31 ENCOUNTER — Telehealth: Payer: Self-pay | Admitting: Orthopedic Surgery

## 2023-10-31 NOTE — Telephone Encounter (Signed)
 Patient called and said that he sent the medication ( Oxycodone  ) without the diagnosis code. The pharmacy needs it. CB#[phone]

## 2023-11-02 ENCOUNTER — Telehealth: Payer: Self-pay | Admitting: Orthopedic Surgery

## 2023-11-02 NOTE — Telephone Encounter (Signed)
 Tried calling patient to let her know I spoke with pharmacist Marolyn to provide diagnosis code and he stated would get rx ready for patient. No answer but LM for patient.

## 2023-11-02 NOTE — Telephone Encounter (Signed)
 Duplicate see other note. LMVM on pharmacy VM

## 2023-11-02 NOTE — Telephone Encounter (Signed)
 Done see other note

## 2023-11-02 NOTE — Telephone Encounter (Signed)
 Pt states [name] with [facility] is unable to refill her Oxycodone  due to something missing off the RX. Pt states she has been without pain meds since Friday 10/28/23.   Pt's call back number [phone]   [facility] in [place]

## 2023-11-06 DIAGNOSIS — M65912 Unspecified synovitis and tenosynovitis, left shoulder: Secondary | ICD-10-CM

## 2023-11-09 ENCOUNTER — Ambulatory Visit (INDEPENDENT_AMBULATORY_CARE_PROVIDER_SITE_OTHER): Admitting: Physical Therapy

## 2023-11-09 ENCOUNTER — Encounter: Payer: Self-pay | Admitting: Physical Therapy

## 2023-11-09 DIAGNOSIS — G8929 Other chronic pain: Secondary | ICD-10-CM

## 2023-11-09 DIAGNOSIS — R600 Localized edema: Secondary | ICD-10-CM

## 2023-11-09 DIAGNOSIS — M25512 Pain in left shoulder: Secondary | ICD-10-CM

## 2023-11-09 DIAGNOSIS — M6281 Muscle weakness (generalized): Secondary | ICD-10-CM | POA: Diagnosis not present

## 2023-11-09 DIAGNOSIS — M25612 Stiffness of left shoulder, not elsewhere classified: Secondary | ICD-10-CM

## 2023-11-09 DIAGNOSIS — R293 Abnormal posture: Secondary | ICD-10-CM

## 2023-11-09 NOTE — Therapy (Signed)
 OUTPATIENT PHYSICAL THERAPY EVALUATION   Patient Name: [name] MRN: [id number] DOB:1965/06/09, 58 y.o., female Today's Date: 11/09/2023  END OF SESSION:  PT End of Session - 11/09/23 0931     Visit Number 1    Number of Visits 21    Date for PT Re-Evaluation 02/01/24    Authorization Type [employer]    PT Start Time 0931    PT Stop Time 0955    PT Time Calculation (min) 24 min    Activity Tolerance Patient tolerated treatment well    Behavior During Therapy [facility] for tasks assessed/performed          Past Medical History:  Diagnosis Date   Anemia    BV (bacterial vaginosis) 11/27/2012   Celiac disease    Cough due to ACE inhibitor 04/25/2019   Depression    Essential hypertension    Family history of adverse reaction to anesthesia    MOM-HARD TIME WAKING UP   GERD (gastroesophageal reflux disease)    Migraine with visual aura    MIGRAINES   UTI (lower urinary tract infection)    Past Surgical History:  Procedure Laterality Date   ABDOMINAL HYSTERECTOMY     BICEPT TENODESIS Left 01/27/2023   Procedure: BICEPS TENODESIS;  Surgeon: Addie Cordella Hamilton, MD;  Location: [facility] OR;  Service: Orthopedics;  Laterality: Left;   BLADDER SUSPENSION     COLONOSCOPY WITH PROPOFOL  N/A 05/11/2022   Procedure: COLONOSCOPY WITH PROPOFOL ;  Surgeon: Therisa Bi, MD;  Location: [facility] ENDOSCOPY;  Service: Gastroenterology;  Laterality: N/A;   ESOPHAGOGASTRODUODENOSCOPY (EGD) WITH PROPOFOL  N/A 05/11/2022   Procedure: ESOPHAGOGASTRODUODENOSCOPY (EGD) WITH PROPOFOL ;  Surgeon: Therisa Bi, MD;  Location: [facility] ENDOSCOPY;  Service: Gastroenterology;  Laterality: N/A;   EXPLORATORY LAPAROTOMY     IUD REMOVAL  07/25/2017   Procedure: INTRAUTERINE DEVICE (IUD) REMOVAL;  Surgeon: Janit Alm Agent, MD;  Location: [facility] ORS;  Service: Gynecology;;   LAPAROSCOPIC ASSISTED VAGINAL HYSTERECTOMY Bilateral 07/25/2017   Procedure: LAPAROSCOPIC ASSISTED VAGINAL HYSTERECTOMY WITH BILATERAL SALPING OOPHERECTOMY;   Surgeon: Janit Alm Agent, MD;  Location: [facility] ORS;  Service: Gynecology;  Laterality: Bilateral;   POSTERIOR LUMBAR FUSION 2 WITH HARDWARE REMOVAL Left 10/18/2023   Procedure: ARTHROSCOPY, SHOULDER WITH DEBRIDEMENT, LEFT;  Surgeon: Addie Cordella Hamilton, MD;  Location: [facility] OR;  Service: Orthopedics;  Laterality: Left;  left shoulder arthroscopy, revision rotator cuff tear repair, possible cuff mend   SHOULDER ARTHROSCOPY WITH OPEN ROTATOR CUFF REPAIR AND DISTAL CLAVICLE ACROMINECTOMY Left 01/27/2023   Procedure: LEFT SHOULDER ARTHROSCOPY, DEBRIDEMENT, DISTAL CLAVICLE EXCISION, MINI OPEN ROTATOR CUFF TEAR REPAIR;  Surgeon: Addie Cordella Hamilton, MD;  Location: [facility] OR;  Service: Orthopedics;  Laterality: Left;   SHOULDER OPEN ROTATOR CUFF REPAIR Left 10/18/2023   Procedure: REPAIR, ROTATOR CUFF, OPEN;  Surgeon: Addie Cordella Hamilton, MD;  Location: [facility] OR;  Service: Orthopedics;  Laterality: Left;   TUBAL LIGATION     Patient Active Problem List   Diagnosis Date Noted   Synovitis of left shoulder 11/06/2023   Acute cystitis with hematuria 08/04/2023   Sepsis secondary to UTI (HCC) 08/01/2023   Obesity (BMI 30-39.9) 08/01/2023   Vertigo 02/25/2023   Complete tear of left rotator cuff 02/08/2023   Biceps tendonitis, left 02/08/2023   Arthritis of left acromioclavicular joint 02/08/2023   Memory change 12/06/2022   Excessive daytime sleepiness 12/06/2022   Prediabetes 07/29/2022   Persistent cough for 3 weeks or longer 01/21/2022   Iron  deficiency anemia 11/30/2021   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) 11/27/2021   Vitamin D  deficiency 11/27/2021   Chronic fatigue 11/27/2021   Paranoid delusion (HCC) 11/27/2021   GERD (gastroesophageal reflux disease) 04/25/2019   Hyperlipidemia, mixed 03/02/2019   Benign essential HTN 02/18/2019   Recurrent cystitis 11/27/2012   Intractable migraine with status migrainosus 09/19/2011   Chronic constipation 06/25/2011     PCP: Gretta Comer POUR, NP  REFERRING PROVIDER: Addie Cordella Hamilton, MD  REFERRING DIAG: [phone] (ICD-10-CM) - Complete tear of left rotator cuff, unspecified whether traumatic  Rationale for Evaluation and Treatment: Rehabilitation  THERAPY DIAG:  Muscle weakness (generalized) - Plan: PT plan of care cert/re-cert  Stiffness of left shoulder, not elsewhere classified - Plan: PT plan of care cert/re-cert  Chronic left shoulder pain - Plan: PT plan of care cert/re-cert  Localized edema - Plan: PT plan of care cert/re-cert  Abnormal posture - Plan: PT plan of care cert/re-cert  ONSET DATE: dos: 10/18/23   SUBJECTIVE:                                                                                                                                                                                           SUBJECTIVE STATEMENT: Pt is s/p Lt RTC revision on 10/18/23. She is no longer using her sling.  PERTINENT HISTORY:  Anemia, migraines, depression, HTN, recent sepsis hospitalization, Lt RTC repair 10/24  PAIN:  Are you having pain? Yes: NPRS scale: 3-4 currently, up to 10/10 Pain location: Lt shoulder Pain description: aching, throbbing Aggravating factors: accidentally moving arm Relieving factors: tylenol , oxy at night  PRECAUTIONS:  Shoulder and Other: PROM only until 11/30/23  RED FLAGS: None   WEIGHT BEARING RESTRICTIONS:  No  FALLS:  Has patient fallen in last 6 months? No  LIVING ENVIRONMENT: Lives with: lives with their spouse and lives with an adult companion Lives in: House/apartment  OCCUPATION:  Mudlogger  PLOF:  Independent and Leisure: spend time with grandchild  PATIENT GOALS:  Use arm without pain, regain function in arm   OBJECTIVE:  Note: Objective measures were completed at Evaluation unless otherwise noted.   PATIENT SURVEYS:  Patient-Specific Activity Scoring Scheme  0 represents "unable to perform." 10 represents  "able to perform at prior level. 0 1 2 3 4 5 6 7 8 9  10 (Date and Score)   Activity Eval     1. Brushing hair 3     2. Working 0    3. UB dressing 4   4.Wash hair 3   Score 2.5    Total score = sum of the activity scores/number of activities Minimum detectable change (90%CI) for  average score = 2 points Minimum detectable change (90%CI) for single activity score = 3 points    COGNITIVE STATUS: History of cognitive impairments - at baseline   SENSATION: WFL  POSTURE:  rounded shoulders and forward head  HAND DOMINANCE:  Right  GAIT: 11/09/23 Comments: independent   EDEMA: 11/09/23 Not formally measured but does appear to have some swelling in Lt shoulder and UE   UPPER EXTREMITY ROM:  ROM Right eval Left eval  Shoulder flexion  P: 116  Shoulder abduction  P: 86  Shoulder internal rotation  P: 60 in 45 deg abd (limited by abdomen)  Shoulder external rotation  P: 45 in 45 deg abduction   (Blank rows = not tested)   UPPER EXTREMITY MMT:  11/09/23: Deferred due to post-op status and precautions  MMT Right eval Left eval  Shoulder flexion    Shoulder extension    Shoulder abduction    Shoulder adduction    Shoulder extension    Shoulder internal rotation    Shoulder external rotation    Middle trapezius    Lower trapezius    Elbow flexion    Elbow extension    Wrist flexion    Wrist extension    Wrist ulnar deviation    Wrist radial deviation    Wrist pronation    Wrist supination    Grip strength     (Blank rows = not tested)      TREATMENT:                                                                                                                              DATE:  11/09/23 TherEx See HEP - demonstrated with trial reps performed PRN, mod cues for comprehension    Self Care Educated on PT POC, clinical findings, and post op precautions     PATIENT EDUCATION:  Education details: HEP Person educated: Patient Education method:  Programmer, multimedia, Facilities manager, and Handouts Education comprehension: verbalized understanding, returned demonstration, and needs further education  HOME EXERCISE PROGRAM: Access Code: [email or web address] URL: [email or web address] Date: 11/09/2023 Prepared by: Corean Ku  Exercises - Circular Shoulder Pendulum with Table Support  - 2-3 x daily - 7 x weekly - 1-2 sets - 10 reps - Flexion-Extension Shoulder Pendulum with Table Support  - 2-3 x daily - 7 x weekly - 1-2 sets - 10 reps - Horizontal Shoulder Pendulum with Table Support  - 2-3 x daily - 7 x weekly - 1-2 sets - 10 reps - Seated Shoulder Flexion Towel Slide at Table Top  - 2-3 x daily - 7 x weekly - 1-2 sets - 10 reps - Seated Shoulder Abduction Towel Slide at Table Top  - 2-3 x daily - 7 x weekly - 1-2 sets - 10 reps - Seated Shoulder External Rotation PROM on Table  - 2-3 x daily - 7 x weekly - 1-2 sets - 10 reps   ASSESSMENT:  CLINICAL IMPRESSION: Patient is  a 58 y.o. female who was seen today for physical therapy evaluation and treatment for Lt shoulder RTC repair revision. She demonstrates decreased strength and ROM as well as expected post op pain and swelling affecting functional mobility.  She will benefit from PT to address deficits listed.     OBJECTIVE IMPAIRMENTS: decreased coordination, decreased ROM, decreased strength, increased edema, increased fascial restrictions, increased muscle spasms, impaired UE functional use, and pain.   ACTIVITY LIMITATIONS: carrying, lifting, sleeping, bed mobility, bathing, toileting, dressing, reach over head, and hygiene/grooming  PARTICIPATION LIMITATIONS: meal prep, cleaning, laundry, driving, shopping, community activity, and occupation  PERSONAL FACTORS: Age, Past/current experiences, Time since onset of injury/illness/exacerbation, and 3+ comorbidities: Anemia, migraines, depression, HTN, recent sepsis hospitalization, Lt RTC repair 10/24 are also affecting patient's  functional outcome.   REHAB POTENTIAL: Good  CLINICAL DECISION MAKING: Evolving/moderate complexity  EVALUATION COMPLEXITY: Moderate   GOALS: Goals reviewed with patient? Yes  SHORT TERM GOALS: Target date: 12/07/2023  Independent with initial HEP Goal status: INITIAL  2.  Lt shoulder PROM improved 15 deg all motions for improved mobility Goal status: INITIAL  3.  Report pain < 4/10 in Lt shoulder  Goal status: INITIAL   LONG TERM GOALS: Target date: 02/01/2024  Independent with final HEP Goal status: INITIAL  2.  PSFS score improved by 3 points Goal status: INITIAL  3.  Lt shoulder AROM improved to [facility] for improved function and strength Goal status: INIITAL  4.  Report pain < 2/10 with reaching and ADLs for improved function Goal status: INITIAL  5.  Demonstrate at least 4/5 Lt shoulder strength in order to safety return to work Goal status: INITIAL  6.  Perform work simulated activities without increase in pain to safely return to work Goal status: INITIAL     PLAN:  PT FREQUENCY: 1x/wk x 3 weeks, then 2x/wk x 9 weeks  PT DURATION: 12 weeks  PLANNED INTERVENTIONS: 97164- PT Re-evaluation, 97750- Physical Performance Testing, 97110-Therapeutic exercises, 97530- Therapeutic activity, [id number]- Neuromuscular re-education, 97535- Self Care, 02859- Manual therapy, [id number]- Aquatic Therapy, H9716- Electrical stimulation (unattended), 97016- Vasopneumatic device, [id number]- Ultrasound, [id number]- Ionotophoresis 4mg /ml Dexamethasone , 79439 (1-2 muscles), 20561 (3+ muscles)- Dry Needling, Patient/Family education, Taping, Joint mobilization, Cryotherapy, and Moist heat.  PLAN FOR NEXT SESSION: Review HEP, PROM only at this time, vaso PRN   PRECAUTIONS: passive range of motion only for 3 weeks (until 11/30/23)   NEXT MD VISIT: 11/17/23   Corean JULIANNA Ku, PT 11/09/2023, 10:11 AM

## 2023-11-14 ENCOUNTER — Other Ambulatory Visit: Payer: Self-pay | Admitting: Orthopedic Surgery

## 2023-11-15 ENCOUNTER — Other Ambulatory Visit: Payer: Self-pay | Admitting: Surgical

## 2023-11-15 ENCOUNTER — Ambulatory Visit (INDEPENDENT_AMBULATORY_CARE_PROVIDER_SITE_OTHER): Payer: Self-pay | Admitting: Physical Therapy

## 2023-11-15 ENCOUNTER — Encounter: Payer: Self-pay | Admitting: Physical Therapy

## 2023-11-15 ENCOUNTER — Telehealth: Payer: Self-pay | Admitting: Orthopedic Surgery

## 2023-11-15 DIAGNOSIS — M6281 Muscle weakness (generalized): Secondary | ICD-10-CM

## 2023-11-15 DIAGNOSIS — R293 Abnormal posture: Secondary | ICD-10-CM

## 2023-11-15 DIAGNOSIS — G8929 Other chronic pain: Secondary | ICD-10-CM

## 2023-11-15 DIAGNOSIS — M25512 Pain in left shoulder: Secondary | ICD-10-CM | POA: Diagnosis not present

## 2023-11-15 DIAGNOSIS — M25612 Stiffness of left shoulder, not elsewhere classified: Secondary | ICD-10-CM | POA: Diagnosis not present

## 2023-11-15 DIAGNOSIS — R600 Localized edema: Secondary | ICD-10-CM | POA: Diagnosis not present

## 2023-11-15 MED ORDER — OXYCODONE HCL 5 MG PO CAPS: 5.0000 mg | ORAL_CAPSULE | Freq: Two times a day (BID) | ORAL | 0 refills | Status: AC | PRN

## 2023-11-15 NOTE — Telephone Encounter (Signed)
 Sent in

## 2023-11-15 NOTE — Therapy (Signed)
 OUTPATIENT PHYSICAL THERAPY TREATMENT  Patient Name: [name] MRN: [id number] DOB:Nov 16, 1965, 58 y.o., female Today's Date: 11/15/2023  END OF SESSION:  PT End of Session - 11/15/23 1449     Visit Number 2    Number of Visits 21    Date for PT Re-Evaluation 02/01/24    Authorization Type UHC/Aetna    PT Start Time 1430    PT Stop Time 1510    PT Time Calculation (min) 40 min    Activity Tolerance Patient tolerated treatment well    Behavior During Therapy [facility] for tasks assessed/performed           Past Medical History:  Diagnosis Date   Anemia    BV (bacterial vaginosis) 11/27/2012   Celiac disease    Cough due to ACE inhibitor 04/25/2019   Depression    Essential hypertension    Family history of adverse reaction to anesthesia    MOM-HARD TIME WAKING UP   GERD (gastroesophageal reflux disease)    Migraine with visual aura    MIGRAINES   UTI (lower urinary tract infection)    Past Surgical History:  Procedure Laterality Date   ABDOMINAL HYSTERECTOMY     BICEPT TENODESIS Left 01/27/2023   Procedure: BICEPS TENODESIS;  Surgeon: Addie Cordella Hamilton, MD;  Location: [facility] OR;  Service: Orthopedics;  Laterality: Left;   BLADDER SUSPENSION     COLONOSCOPY WITH PROPOFOL  N/A 05/11/2022   Procedure: COLONOSCOPY WITH PROPOFOL ;  Surgeon: Therisa Bi, MD;  Location: [facility] ENDOSCOPY;  Service: Gastroenterology;  Laterality: N/A;   ESOPHAGOGASTRODUODENOSCOPY (EGD) WITH PROPOFOL  N/A 05/11/2022   Procedure: ESOPHAGOGASTRODUODENOSCOPY (EGD) WITH PROPOFOL ;  Surgeon: Therisa Bi, MD;  Location: [facility] ENDOSCOPY;  Service: Gastroenterology;  Laterality: N/A;   EXPLORATORY LAPAROTOMY     IUD REMOVAL  07/25/2017   Procedure: INTRAUTERINE DEVICE (IUD) REMOVAL;  Surgeon: Janit Alm Agent, MD;  Location: [facility] ORS;  Service: Gynecology;;   LAPAROSCOPIC ASSISTED VAGINAL HYSTERECTOMY Bilateral 07/25/2017   Procedure: LAPAROSCOPIC ASSISTED VAGINAL HYSTERECTOMY WITH BILATERAL SALPING OOPHERECTOMY;   Surgeon: Janit Alm Agent, MD;  Location: [facility] ORS;  Service: Gynecology;  Laterality: Bilateral;   POSTERIOR LUMBAR FUSION 2 WITH HARDWARE REMOVAL Left 10/18/2023   Procedure: ARTHROSCOPY, SHOULDER WITH DEBRIDEMENT, LEFT;  Surgeon: Addie Cordella Hamilton, MD;  Location: [facility] OR;  Service: Orthopedics;  Laterality: Left;  left shoulder arthroscopy, revision rotator cuff tear repair, possible cuff mend   SHOULDER ARTHROSCOPY WITH OPEN ROTATOR CUFF REPAIR AND DISTAL CLAVICLE ACROMINECTOMY Left 01/27/2023   Procedure: LEFT SHOULDER ARTHROSCOPY, DEBRIDEMENT, DISTAL CLAVICLE EXCISION, MINI OPEN ROTATOR CUFF TEAR REPAIR;  Surgeon: Addie Cordella Hamilton, MD;  Location: [facility] OR;  Service: Orthopedics;  Laterality: Left;   SHOULDER OPEN ROTATOR CUFF REPAIR Left 10/18/2023   Procedure: REPAIR, ROTATOR CUFF, OPEN;  Surgeon: Addie Cordella Hamilton, MD;  Location: [facility] OR;  Service: Orthopedics;  Laterality: Left;   TUBAL LIGATION     Patient Active Problem List   Diagnosis Date Noted   Synovitis of left shoulder 11/06/2023   Acute cystitis with hematuria 08/04/2023   Sepsis secondary to UTI (HCC) 08/01/2023   Obesity (BMI 30-39.9) 08/01/2023   Vertigo 02/25/2023   Complete tear of left rotator cuff 02/08/2023   Biceps tendonitis, left 02/08/2023   Arthritis of left acromioclavicular joint 02/08/2023   Memory change 12/06/2022   Excessive daytime sleepiness 12/06/2022   Prediabetes 07/29/2022   Persistent cough for 3 weeks or longer 01/21/2022   Iron  deficiency anemia 11/30/2021   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) 11/27/2021   Vitamin D  deficiency 11/27/2021   Chronic fatigue 11/27/2021   Paranoid delusion (HCC) 11/27/2021   GERD (gastroesophageal reflux disease) 04/25/2019   Hyperlipidemia, mixed 03/02/2019   Benign essential HTN 02/18/2019   Recurrent cystitis 11/27/2012   Intractable migraine with status migrainosus 09/19/2011   Chronic constipation 06/25/2011     PCP: Gretta Comer POUR, NP  REFERRING PROVIDER: Addie Cordella Hamilton, MD  REFERRING DIAG: [phone] (ICD-10-CM) - Complete tear of left rotator cuff, unspecified whether traumatic  Rationale for Evaluation and Treatment: Rehabilitation  THERAPY DIAG:  Muscle weakness (generalized)  Stiffness of left shoulder, not elsewhere classified  Chronic left shoulder pain  Localized edema  Abnormal posture  ONSET DATE: dos: 10/18/23   SUBJECTIVE:                                                                                                                                                                                           SUBJECTIVE STATEMENT: Pt arriving today reporting 5-6/10.   PERTINENT HISTORY:  Pt is s/p Lt RTC revision on 10/18/23. She is no longer using her sling. Anemia, migraines, depression, HTN, recent sepsis hospitalization, Lt RTC repair 10/24  PAIN:  Are you having pain? Yes: NPRS scale: 5-6/10 Pain location: Lt shoulder Pain description: aching, throbbing Aggravating factors: accidentally moving arm Relieving factors: tylenol , oxy at night  PRECAUTIONS:  Shoulder and Other: PROM only until 11/30/23  RED FLAGS: None   WEIGHT BEARING RESTRICTIONS:  No  FALLS:  Has patient fallen in last 6 months? No  LIVING ENVIRONMENT: Lives with: lives with their spouse and lives with an adult companion Lives in: House/apartment  OCCUPATION:  Mudlogger  PLOF:  Independent and Leisure: spend time with grandchild  PATIENT GOALS:  Use arm without pain, regain function in arm   OBJECTIVE:  Note: Objective measures were completed at Evaluation unless otherwise noted.   PATIENT SURVEYS:  Patient-Specific Activity Scoring Scheme  0 represents "unable to perform." 10 represents "able to perform at prior level. 0 1 2 3 4 5 6 7 8 9  10 (Date and Score)   Activity Eval     1. Brushing hair 3     2. Working 0    3. UB dressing 4   4.Wash  hair 3   Score 2.5    Total score = sum of the activity scores/number of activities Minimum detectable change (90%CI) for average score = 2 points Minimum detectable change (90%CI) for single activity score = 3 points    COGNITIVE STATUS: History of cognitive impairments - at baseline   SENSATION: [facility]  POSTURE:  rounded shoulders and forward head  HAND DOMINANCE:  Right  GAIT: 11/09/23 Comments: independent   EDEMA: 11/09/23 Not formally measured but does appear to have some swelling in Lt shoulder and UE   UPPER EXTREMITY ROM:  ROM Right eval Left eval Left 11/15/23  Shoulder flexion  P: 116 P: 115  Shoulder abduction  P: 86 P: 100  Shoulder internal rotation  P: 60 in 45 deg abd (limited by abdomen)   Shoulder external rotation  P: 45 in 45 deg abduction    (Blank rows = not tested)   UPPER EXTREMITY MMT:  11/09/23: Deferred due to post-op status and precautions  MMT Right eval Left eval  Shoulder flexion    Shoulder extension    Shoulder abduction    Shoulder adduction    Shoulder extension    Shoulder internal rotation    Shoulder external rotation    Middle trapezius    Lower trapezius    Elbow flexion    Elbow extension    Wrist flexion    Wrist extension    Wrist ulnar deviation    Wrist radial deviation    Wrist pronation    Wrist supination    Grip strength     (Blank rows = not tested)      TREATMENT DATE:    11/15/23:  TherEx Pendulums: flexion/extension, add/abd, circles both directions x 2 minutes each Table slides: flexion, scaption x 15 holding end range x 5 seconds Upper trap stretch x 3 bil holding 15-20 seconds Levator stretch x 3 bil holding 15-20 seconds Neuro Re-Ed Scapular squeezes x 10 holding 10 sec Manual:  PROM left shoulder to pt's tolerance Modalities Vasopneumatic 34 deg low compression to left shoulder sitting x 10 minutes                                                                                                                                11/09/23 TherEx See HEP - demonstrated with trial reps performed PRN, mod cues for comprehension    Self Care Educated on PT POC, clinical findings, and post op precautions     PATIENT EDUCATION:  Education details: HEP Person educated: Patient Education method: Programmer, multimedia, Facilities manager, and Handouts Education comprehension: verbalized understanding, returned demonstration, and needs further education  HOME EXERCISE PROGRAM: Access Code: [email or web address] URL: [email or web address] Date: 11/09/2023 Prepared by: Corean Ku  Exercises - Circular Shoulder Pendulum with Table Support  - 2-3 x daily - 7 x weekly - 1-2 sets - 10 reps - Flexion-Extension Shoulder Pendulum with Table Support  - 2-3 x daily - 7 x weekly - 1-2 sets - 10 reps - Horizontal Shoulder Pendulum with Table Support  - 2-3 x daily - 7 x weekly - 1-2 sets - 10 reps - Seated Shoulder Flexion Towel Slide at Table Top  - 2-3 x daily - 7 x weekly - 1-2 sets - 10 reps - Seated Shoulder Abduction Towel  Slide at Table Top  - 2-3 x daily - 7 x weekly - 1-2 sets - 10 reps - Seated Shoulder External Rotation PROM on Table  - 2-3 x daily - 7 x weekly - 1-2 sets - 10 reps   ASSESSMENT:  CLINICAL IMPRESSION: Pt's HEP reviewed. Pt able to demonstrate independence. PROM performed with improvements in ROM see charts above. Recommend continued skilled PT interventions .    OBJECTIVE IMPAIRMENTS: decreased coordination, decreased ROM, decreased strength, increased edema, increased fascial restrictions, increased muscle spasms, impaired UE functional use, and pain.   ACTIVITY LIMITATIONS: carrying, lifting, sleeping, bed mobility, bathing, toileting, dressing, reach over head, and hygiene/grooming  PARTICIPATION LIMITATIONS: meal prep, cleaning, laundry, driving, shopping, community activity, and occupation  PERSONAL FACTORS: Age, Past/current experiences, Time since  onset of injury/illness/exacerbation, and 3+ comorbidities: Anemia, migraines, depression, HTN, recent sepsis hospitalization, Lt RTC repair 10/24 are also affecting patient's functional outcome.   REHAB POTENTIAL: Good  CLINICAL DECISION MAKING: Evolving/moderate complexity  EVALUATION COMPLEXITY: Moderate   GOALS: Goals reviewed with patient? Yes  SHORT TERM GOALS: Target date: 12/07/2023  Independent with initial HEP Goal status: ongoing 11/15/23  2.  Lt shoulder PROM improved 15 deg all motions for improved mobility Goal status: INITIAL  3.  Report pain < 4/10 in Lt shoulder  Goal status: INITIAL   LONG TERM GOALS: Target date: 02/01/2024  Independent with final HEP Goal status: INITIAL  2.  PSFS score improved by 3 points Goal status: INITIAL  3.  Lt shoulder AROM improved to [facility] for improved function and strength Goal status: INIITAL  4.  Report pain < 2/10 with reaching and ADLs for improved function Goal status: INITIAL  5.  Demonstrate at least 4/5 Lt shoulder strength in order to safety return to work Goal status: INITIAL  6.  Perform work simulated activities without increase in pain to safely return to work Goal status: INITIAL     PLAN:  PT FREQUENCY: 1x/wk x 3 weeks, then 2x/wk x 9 weeks  PT DURATION: 12 weeks  PLANNED INTERVENTIONS: 97164- PT Re-evaluation, 97750- Physical Performance Testing, 97110-Therapeutic exercises, 97530- Therapeutic activity, [id number]- Neuromuscular re-education, 97535- Self Care, 02859- Manual therapy, [id number]- Aquatic Therapy, H9716- Electrical stimulation (unattended), 97016- Vasopneumatic device, [id number]- Ultrasound, [id number]- Ionotophoresis 4mg /ml Dexamethasone , 79439 (1-2 muscles), 20561 (3+ muscles)- Dry Needling, Patient/Family education, Taping, Joint mobilization, Cryotherapy, and Moist heat.  PLAN FOR NEXT SESSION:  continue PROM only at this time, vaso PRN   PRECAUTIONS: passive range of motion only for 3 weeks (until  11/30/23)   NEXT MD VISIT: 11/17/23   Delon JONELLE Lunger, PT, MPT 11/15/2023, 2:51 PM

## 2023-11-15 NOTE — Telephone Encounter (Signed)
 Patient is here for PT would like some pain medication called in for her.

## 2023-11-17 ENCOUNTER — Ambulatory Visit (INDEPENDENT_AMBULATORY_CARE_PROVIDER_SITE_OTHER): Admitting: Surgical

## 2023-11-17 DIAGNOSIS — M75122 Complete rotator cuff tear or rupture of left shoulder, not specified as traumatic: Secondary | ICD-10-CM

## 2023-11-17 NOTE — Telephone Encounter (Signed)
 Left voicemail advising.

## 2023-11-25 ENCOUNTER — Ambulatory Visit (INDEPENDENT_AMBULATORY_CARE_PROVIDER_SITE_OTHER)

## 2023-11-25 DIAGNOSIS — M6281 Muscle weakness (generalized): Secondary | ICD-10-CM

## 2023-11-25 DIAGNOSIS — M25512 Pain in left shoulder: Secondary | ICD-10-CM

## 2023-11-25 DIAGNOSIS — R600 Localized edema: Secondary | ICD-10-CM | POA: Diagnosis not present

## 2023-11-25 DIAGNOSIS — M25612 Stiffness of left shoulder, not elsewhere classified: Secondary | ICD-10-CM

## 2023-11-25 DIAGNOSIS — R293 Abnormal posture: Secondary | ICD-10-CM

## 2023-11-25 DIAGNOSIS — G8929 Other chronic pain: Secondary | ICD-10-CM

## 2023-11-25 NOTE — Therapy (Signed)
 OUTPATIENT PHYSICAL THERAPY TREATMENT  Patient Name: [name] MRN: [id number] DOB:Sep 04, 1965, 58 y.o., female Today's Date: 11/25/2023  END OF SESSION:  PT End of Session - 11/25/23 1003     Visit Number 3    Number of Visits 21    Date for PT Re-Evaluation 02/01/24    Authorization Type UHC/Aetna    PT Start Time 0923    PT Stop Time 1001    PT Time Calculation (min) 38 min    Activity Tolerance Patient tolerated treatment well    Behavior During Therapy [facility] for tasks assessed/performed             Past Medical History:  Diagnosis Date   Anemia    BV (bacterial vaginosis) 11/27/2012   Celiac disease    Cough due to ACE inhibitor 04/25/2019   Depression    Essential hypertension    Family history of adverse reaction to anesthesia    MOM-HARD TIME WAKING UP   GERD (gastroesophageal reflux disease)    Migraine with visual aura    MIGRAINES   UTI (lower urinary tract infection)    Past Surgical History:  Procedure Laterality Date   ABDOMINAL HYSTERECTOMY     BICEPT TENODESIS Left 01/27/2023   Procedure: BICEPS TENODESIS;  Surgeon: Addie Cordella Hamilton, MD;  Location: [facility] OR;  Service: Orthopedics;  Laterality: Left;   BLADDER SUSPENSION     COLONOSCOPY WITH PROPOFOL  N/A 05/11/2022   Procedure: COLONOSCOPY WITH PROPOFOL ;  Surgeon: Therisa Bi, MD;  Location: [facility] ENDOSCOPY;  Service: Gastroenterology;  Laterality: N/A;   ESOPHAGOGASTRODUODENOSCOPY (EGD) WITH PROPOFOL  N/A 05/11/2022   Procedure: ESOPHAGOGASTRODUODENOSCOPY (EGD) WITH PROPOFOL ;  Surgeon: Therisa Bi, MD;  Location: [facility] ENDOSCOPY;  Service: Gastroenterology;  Laterality: N/A;   EXPLORATORY LAPAROTOMY     IUD REMOVAL  07/25/2017   Procedure: INTRAUTERINE DEVICE (IUD) REMOVAL;  Surgeon: Janit Alm Agent, MD;  Location: [facility] ORS;  Service: Gynecology;;   LAPAROSCOPIC ASSISTED VAGINAL HYSTERECTOMY Bilateral 07/25/2017   Procedure: LAPAROSCOPIC ASSISTED VAGINAL HYSTERECTOMY WITH BILATERAL SALPING  OOPHERECTOMY;  Surgeon: Janit Alm Agent, MD;  Location: [facility] ORS;  Service: Gynecology;  Laterality: Bilateral;   POSTERIOR LUMBAR FUSION 2 WITH HARDWARE REMOVAL Left 10/18/2023   Procedure: ARTHROSCOPY, SHOULDER WITH DEBRIDEMENT, LEFT;  Surgeon: Addie Cordella Hamilton, MD;  Location: [facility] OR;  Service: Orthopedics;  Laterality: Left;  left shoulder arthroscopy, revision rotator cuff tear repair, possible cuff mend   SHOULDER ARTHROSCOPY WITH OPEN ROTATOR CUFF REPAIR AND DISTAL CLAVICLE ACROMINECTOMY Left 01/27/2023   Procedure: LEFT SHOULDER ARTHROSCOPY, DEBRIDEMENT, DISTAL CLAVICLE EXCISION, MINI OPEN ROTATOR CUFF TEAR REPAIR;  Surgeon: Addie Cordella Hamilton, MD;  Location: [facility] OR;  Service: Orthopedics;  Laterality: Left;   SHOULDER OPEN ROTATOR CUFF REPAIR Left 10/18/2023   Procedure: REPAIR, ROTATOR CUFF, OPEN;  Surgeon: Addie Cordella Hamilton, MD;  Location: [facility] OR;  Service: Orthopedics;  Laterality: Left;   TUBAL LIGATION     Patient Active Problem List   Diagnosis Date Noted   Synovitis of left shoulder 11/06/2023   Acute cystitis with hematuria 08/04/2023   Sepsis secondary to UTI (HCC) 08/01/2023   Obesity (BMI 30-39.9) 08/01/2023   Vertigo 02/25/2023   Complete tear of left rotator cuff 02/08/2023   Biceps tendonitis, left 02/08/2023   Arthritis of left acromioclavicular joint 02/08/2023   Memory change 12/06/2022   Excessive daytime sleepiness 12/06/2022   Prediabetes 07/29/2022   Persistent cough for 3 weeks or longer 01/21/2022   Iron  deficiency anemia 11/30/2021   Current severe episode of  major depressive disorder with psychotic features (HCC) 11/27/2021   Vitamin D  deficiency 11/27/2021   Chronic fatigue 11/27/2021   Paranoid delusion (HCC) 11/27/2021   GERD (gastroesophageal reflux disease) 04/25/2019   Hyperlipidemia, mixed 03/02/2019   Benign essential HTN 02/18/2019   Recurrent cystitis 11/27/2012   Intractable migraine with status migrainosus 09/19/2011   Chronic  constipation 06/25/2011    PCP: Gretta Comer POUR, NP  REFERRING PROVIDER: Addie Cordella Hamilton, MD  REFERRING DIAG: [phone] (ICD-10-CM) - Complete tear of left rotator cuff, unspecified whether traumatic  Rationale for Evaluation and Treatment: Rehabilitation  THERAPY DIAG:  Muscle weakness (generalized)  Localized edema  Stiffness of left shoulder, not elsewhere classified  Chronic left shoulder pain  Abnormal posture  ONSET DATE: dos: 10/18/23   SUBJECTIVE:                                                                                                                                                                                           SUBJECTIVE STATEMENT: Pt states she has soreness and and some burning in the arm.    PERTINENT HISTORY:  Pt is s/p Lt RTC revision on 10/18/23. She is no longer using her sling. Anemia, migraines, depression, HTN, recent sepsis hospitalization, Lt RTC repair 10/24  PAIN:  Are you having pain? Yes: NPRS scale: 4-5/10 Pain location: Lt shoulder Pain description: aching, throbbing Aggravating factors: accidentally moving arm Relieving factors: tylenol , oxy at night  PRECAUTIONS:  Shoulder and Other: PROM only until 11/30/23  RED FLAGS: None   WEIGHT BEARING RESTRICTIONS:  No  FALLS:  Has patient fallen in last 6 months? No  LIVING ENVIRONMENT: Lives with: lives with their spouse and lives with an adult companion Lives in: House/apartment  OCCUPATION:  Mudlogger  PLOF:  Independent and Leisure: spend time with grandchild  PATIENT GOALS:  Use arm without pain, regain function in arm   OBJECTIVE:  Note: Objective measures were completed at Evaluation unless otherwise noted.   PATIENT SURVEYS:  Patient-Specific Activity Scoring Scheme  0 represents "unable to perform." 10 represents "able to perform at prior level. 0 1 2 3 4 5 6 7 8 9  10 (Date and Score)   Activity Eval     1. Brushing hair 3      2. Working 0    3. UB dressing 4   4.Wash hair 3   Score 2.5    Total score = sum of the activity scores/number of activities Minimum detectable change (90%CI) for average score = 2 points Minimum detectable change (90%CI) for single activity score = 3 points    COGNITIVE STATUS: History of  cognitive impairments - at baseline   SENSATION: WFL  POSTURE:  rounded shoulders and forward head  HAND DOMINANCE:  Right  GAIT: 11/09/23 Comments: independent   EDEMA: 11/09/23 Not formally measured but does appear to have some swelling in Lt shoulder and UE   UPPER EXTREMITY ROM:  ROM Right eval Left eval Left 11/15/23  Shoulder flexion  P: 116 P: 115  Shoulder abduction  P: 86 P: 100  Shoulder internal rotation  P: 60 in 45 deg abd (limited by abdomen)   Shoulder external rotation  P: 45 in 45 deg abduction    (Blank rows = not tested)   UPPER EXTREMITY MMT:  11/09/23: Deferred due to post-op status and precautions  MMT Right eval Left eval  Shoulder flexion    Shoulder extension    Shoulder abduction    Shoulder adduction    Shoulder extension    Shoulder internal rotation    Shoulder external rotation    Middle trapezius    Lower trapezius    Elbow flexion    Elbow extension    Wrist flexion    Wrist extension    Wrist ulnar deviation    Wrist radial deviation    Wrist pronation    Wrist supination    Grip strength     (Blank rows = not tested)      TREATMENT DATE:    11/25/23   TherEx Pendulums: flexion/extension, add/abd, circles both directions 20x each way Table slides: flexion, scaption x 15 holding end range x 5 seconds Upper trap stretch x 3 bil holding 25 seconds Left wrist and elbow AROM  Neuro Re-Ed Scapular squeezes 2x 10 holding 10 sec Shoulder shrugs 2x10 Manual:  PROM left shoulder to pt's tolerance Modalities Vasopneumatic 34 deg low compression to left shoulder sitting x 10 minutes     11/15/23:  TherEx Pendulums:  flexion/extension, add/abd, circles both directions x 2 minutes each Table slides: flexion, scaption x 15 holding end range x 5 seconds Upper trap stretch x 3 bil holding 15-20 seconds Levator stretch x 3 bil holding 15-20 seconds Neuro Re-Ed Scapular squeezes x 10 holding 10 sec Manual:  PROM left shoulder to pt's tolerance Modalities Vasopneumatic 34 deg low compression to left shoulder sitting x 10 minutes                                                                                                                               11/09/23 TherEx See HEP - demonstrated with trial reps performed PRN, mod cues for comprehension    Self Care Educated on PT POC, clinical findings, and post op precautions     PATIENT EDUCATION:  Education details: HEP Person educated: Patient Education method: Programmer, multimedia, Facilities manager, and Handouts Education comprehension: verbalized understanding, returned demonstration, and needs further education  HOME EXERCISE PROGRAM: Access Code: [email or web address] URL: [email or web address] Date: 11/09/2023 Prepared by: Corean Ku  Exercises - Circular Shoulder Pendulum with Table Support  -  2-3 x daily - 7 x weekly - 1-2 sets - 10 reps - Flexion-Extension Shoulder Pendulum with Table Support  - 2-3 x daily - 7 x weekly - 1-2 sets - 10 reps - Horizontal Shoulder Pendulum with Table Support  - 2-3 x daily - 7 x weekly - 1-2 sets - 10 reps - Seated Shoulder Flexion Towel Slide at Table Top  - 2-3 x daily - 7 x weekly - 1-2 sets - 10 reps - Seated Shoulder Abduction Towel Slide at Table Top  - 2-3 x daily - 7 x weekly - 1-2 sets - 10 reps - Seated Shoulder External Rotation PROM on Table  - 2-3 x daily - 7 x weekly - 1-2 sets - 10 reps   ASSESSMENT:  CLINICAL IMPRESSION: Pt demonstrated good understanding of HEP after review.    OBJECTIVE IMPAIRMENTS: decreased coordination, decreased ROM, decreased strength, increased edema, increased  fascial restrictions, increased muscle spasms, impaired UE functional use, and pain.   ACTIVITY LIMITATIONS: carrying, lifting, sleeping, bed mobility, bathing, toileting, dressing, reach over head, and hygiene/grooming  PARTICIPATION LIMITATIONS: meal prep, cleaning, laundry, driving, shopping, community activity, and occupation  PERSONAL FACTORS: Age, Past/current experiences, Time since onset of injury/illness/exacerbation, and 3+ comorbidities: Anemia, migraines, depression, HTN, recent sepsis hospitalization, Lt RTC repair 10/24 are also affecting patient's functional outcome.   REHAB POTENTIAL: Good  CLINICAL DECISION MAKING: Evolving/moderate complexity  EVALUATION COMPLEXITY: Moderate   GOALS: Goals reviewed with patient? Yes  SHORT TERM GOALS: Target date: 12/07/2023  Independent with initial HEP Goal status: ongoing 11/15/23  2.  Lt shoulder PROM improved 15 deg all motions for improved mobility Goal status: INITIAL  3.  Report pain < 4/10 in Lt shoulder  Goal status: INITIAL   LONG TERM GOALS: Target date: 02/01/2024  Independent with final HEP Goal status: INITIAL  2.  PSFS score improved by 3 points Goal status: INITIAL  3.  Lt shoulder AROM improved to [facility] for improved function and strength Goal status: INIITAL  4.  Report pain < 2/10 with reaching and ADLs for improved function Goal status: INITIAL  5.  Demonstrate at least 4/5 Lt shoulder strength in order to safety return to work Goal status: INITIAL  6.  Perform work simulated activities without increase in pain to safely return to work Goal status: INITIAL     PLAN:  PT FREQUENCY: 1x/wk x 3 weeks, then 2x/wk x 9 weeks  PT DURATION: 12 weeks  PLANNED INTERVENTIONS: 97164- PT Re-evaluation, 97750- Physical Performance Testing, 97110-Therapeutic exercises, 97530- Therapeutic activity, [id number]- Neuromuscular re-education, 97535- Self Care, 02859- Manual therapy, [id number]- Aquatic Therapy, H9716-  Electrical stimulation (unattended), 97016- Vasopneumatic device, [id number]- Ultrasound, [id number]- Ionotophoresis 4mg /ml Dexamethasone , 79439 (1-2 muscles), 20561 (3+ muscles)- Dry Needling, Patient/Family education, Taping, Joint mobilization, Cryotherapy, and Moist heat.  PLAN FOR NEXT SESSION:  continue PROM only at this time, vaso PRN   PRECAUTIONS: passive range of motion only for 3 weeks (until 11/30/23)   NEXT MD VISIT: 11/17/23   Burnard CHRISTELLA Meth, PT, MPT 11/25/2023, 10:09 AM

## 2023-11-27 ENCOUNTER — Encounter: Payer: Self-pay | Admitting: Surgical

## 2023-11-27 NOTE — Progress Notes (Signed)
 Post-Op Visit Note   Patient: [name]           Date of Birth: 17-Apr-1965           MRN: [id number] Visit Date: 11/17/2023 PCP: Gretta Comer POUR, NP   Assessment & Plan:  Chief Complaint:  Chief Complaint  Patient presents with   Left Shoulder - Routine Post Op, Follow-up    10/18/2023 left shoulder arthroscopy, deb, open revision rotator cuff repair   Visit Diagnoses:  1. Complete tear of left rotator cuff, unspecified whether traumatic     Plan: Patient is a 59 year old female who presents s/p left shoulder arthroscopy with debridement and open revision rotator cuff tear repair on 10/18/2023.  Overall patient states that she is doing okay.  Last night and today she has had some burning sensation in her shoulder but overall she feels to be progressing well.  Physical therapy is going well and she is going about 1 time per week.  Notes some soreness after PT but nothing that she cannot handle.  Takes occasional oxycodone  as well as Tylenol  and ibuprofen .  On exam, patient has 30 degrees X rotation, 70 degrees abduction, 105 degrees forward elevation passively.  No crepitus noted with passive motion of the shoulder.  Incisions are healing well without evidence of infection or dehiscence.  Excellent rotator cuff strength of supra, infra, subscap rated 5/5.  Plan at this time is continue with physical therapy.  Follow-up in 4 weeks for clinical recheck.  Okay to progress to full active range of motion and some rotator cuff strengthening around 11/29/2023  Follow-Up Instructions: No follow-ups on file.   Orders:  No orders of the defined types were placed in this encounter.  No orders of the defined types were placed in this encounter.   Imaging: No results found.  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) 11/27/2021   Vitamin D  deficiency 11/27/2021   Chronic fatigue 11/27/2021   Paranoid delusion (HCC) 11/27/2021   GERD (gastroesophageal reflux disease) 04/25/2019   Hyperlipidemia, mixed 03/02/2019   Benign essential HTN 02/18/2019   Recurrent cystitis 11/27/2012   Intractable migraine with status migrainosus 09/19/2011   Chronic constipation 06/25/2011   Past Medical History:  Diagnosis Date   Anemia    BV (bacterial vaginosis) 11/27/2012   Celiac disease    Cough due to ACE inhibitor 04/25/2019   Depression    Essential hypertension    Family history of adverse reaction to anesthesia    MOM-HARD TIME WAKING UP   GERD (gastroesophageal reflux disease)    Migraine with visual aura    MIGRAINES   UTI (lower urinary tract infection)     Family History  Problem Relation Age of Onset   Hypertension Mother    Stroke Mother    Cancer Father    Breast cancer Maternal Aunt    Hypertension Maternal Grandmother  Colon cancer Maternal Grandmother    Breast cancer Maternal Grandmother    Skin cancer Maternal Grandmother    Hypertension Maternal Grandfather    Hypertension Paternal Grandmother    Diabetes Paternal Grandmother    Hypertension Paternal Grandfather    Diabetes Son 22       type 1   Breast cancer Other     Past Surgical History:  Procedure Laterality Date   ABDOMINAL HYSTERECTOMY     BICEPT TENODESIS Left 01/27/2023   Procedure: BICEPS TENODESIS;  Surgeon: Addie Cordella Hamilton, MD;  Location: [facility] OR;  Service: Orthopedics;   Laterality: Left;   BLADDER SUSPENSION     COLONOSCOPY WITH PROPOFOL  N/A 05/11/2022   Procedure: COLONOSCOPY WITH PROPOFOL ;  Surgeon: Therisa Bi, MD;  Location: [facility] ENDOSCOPY;  Service: Gastroenterology;  Laterality: N/A;   ESOPHAGOGASTRODUODENOSCOPY (EGD) WITH PROPOFOL  N/A 05/11/2022   Procedure: ESOPHAGOGASTRODUODENOSCOPY (EGD) WITH PROPOFOL ;  Surgeon: Therisa Bi, MD;  Location: [facility] ENDOSCOPY;  Service: Gastroenterology;  Laterality: N/A;   EXPLORATORY LAPAROTOMY     IUD REMOVAL  07/25/2017   Procedure: INTRAUTERINE DEVICE (IUD) REMOVAL;  Surgeon: Janit Alm Agent, MD;  Location: [facility] ORS;  Service: Gynecology;;   LAPAROSCOPIC ASSISTED VAGINAL HYSTERECTOMY Bilateral 07/25/2017   Procedure: LAPAROSCOPIC ASSISTED VAGINAL HYSTERECTOMY WITH BILATERAL SALPING OOPHERECTOMY;  Surgeon: Janit Alm Agent, MD;  Location: [facility] ORS;  Service: Gynecology;  Laterality: Bilateral;   POSTERIOR LUMBAR FUSION 2 WITH HARDWARE REMOVAL Left 10/18/2023   Procedure: ARTHROSCOPY, SHOULDER WITH DEBRIDEMENT, LEFT;  Surgeon: Addie Cordella Hamilton, MD;  Location: [facility] OR;  Service: Orthopedics;  Laterality: Left;  left shoulder arthroscopy, revision rotator cuff tear repair, possible cuff mend   SHOULDER ARTHROSCOPY WITH OPEN ROTATOR CUFF REPAIR AND DISTAL CLAVICLE ACROMINECTOMY Left 01/27/2023   Procedure: LEFT SHOULDER ARTHROSCOPY, DEBRIDEMENT, DISTAL CLAVICLE EXCISION, MINI OPEN ROTATOR CUFF TEAR REPAIR;  Surgeon: Addie Cordella Hamilton, MD;  Location: [facility] OR;  Service: Orthopedics;  Laterality: Left;   SHOULDER OPEN ROTATOR CUFF REPAIR Left 10/18/2023   Procedure: REPAIR, ROTATOR CUFF, OPEN;  Surgeon: Addie Cordella Hamilton, MD;  Location: [facility] OR;  Service: Orthopedics;  Laterality: Left;   TUBAL LIGATION     Social History   Occupational History   Occupation: Corporate treasurer  Tobacco Use   Smoking status: Never   Smokeless tobacco: Never  Vaping Use   Vaping status: Never Used  Substance and Sexual  Activity   Alcohol use: No    Alcohol/week: 0.0 standard drinks of alcohol   Drug use: No   Sexual activity: Yes    Partners: Female    Birth control/protection: Surgical    Comment: INTERCOURSE AGE 80, SEXUAL PARTNERS LEES THAN 5

## 2023-11-28 ENCOUNTER — Telehealth: Payer: Self-pay | Admitting: Oncology

## 2023-11-28 ENCOUNTER — Encounter: Payer: Self-pay | Admitting: Orthopedic Surgery

## 2023-11-28 NOTE — Telephone Encounter (Signed)
 Patient called to scheduled an appointment. She is a previous Dr. DELENA patient. Her last appointments 06/30/23 were no show. Lab/md/iron . Please advise how to schedule this patient

## 2023-11-29 NOTE — Telephone Encounter (Signed)
 Her next appointment is 12/19/2023.  Can we make the return to work 12/20/2023 pending the progress that we observed at her appointment on 9/8?  That way we can discuss how she is doing and evaluate her strength, etc. at that visit

## 2023-11-30 ENCOUNTER — Telehealth: Payer: Self-pay | Admitting: Physical Therapy

## 2023-11-30 ENCOUNTER — Encounter: Admitting: Physical Therapy

## 2023-11-30 NOTE — Telephone Encounter (Signed)
 Pt texted no to reminder text for appt, front office attempted to reach patient to confirm if pt needed to cancel.  Pt did not show for appt today.  Corean JULIANNA Ku, PT, DPT 11/30/23 10:12 AM

## 2023-11-30 NOTE — Therapy (Incomplete)
 OUTPATIENT PHYSICAL THERAPY TREATMENT  Patient Name: [name] MRN: [id number] DOB:July 02, 1965, 58 y.o., female Today's Date: 11/30/2023  END OF SESSION:       Past Medical History:  Diagnosis Date   Anemia    BV (bacterial vaginosis) 11/27/2012   Celiac disease    Cough due to ACE inhibitor 04/25/2019   Depression    Essential hypertension    Family history of adverse reaction to anesthesia    MOM-HARD TIME WAKING UP   GERD (gastroesophageal reflux disease)    Migraine with visual aura    MIGRAINES   UTI (lower urinary tract infection)    Past Surgical History:  Procedure Laterality Date   ABDOMINAL HYSTERECTOMY     BICEPT TENODESIS Left 01/27/2023   Procedure: BICEPS TENODESIS;  Surgeon: Addie Cordella Hamilton, MD;  Location: [facility] OR;  Service: Orthopedics;  Laterality: Left;   BLADDER SUSPENSION     COLONOSCOPY WITH PROPOFOL  N/A 05/11/2022   Procedure: COLONOSCOPY WITH PROPOFOL ;  Surgeon: Therisa Bi, MD;  Location: [facility] ENDOSCOPY;  Service: Gastroenterology;  Laterality: N/A;   ESOPHAGOGASTRODUODENOSCOPY (EGD) WITH PROPOFOL  N/A 05/11/2022   Procedure: ESOPHAGOGASTRODUODENOSCOPY (EGD) WITH PROPOFOL ;  Surgeon: Therisa Bi, MD;  Location: [facility] ENDOSCOPY;  Service: Gastroenterology;  Laterality: N/A;   EXPLORATORY LAPAROTOMY     IUD REMOVAL  07/25/2017   Procedure: INTRAUTERINE DEVICE (IUD) REMOVAL;  Surgeon: Janit Alm Agent, MD;  Location: [facility] ORS;  Service: Gynecology;;   LAPAROSCOPIC ASSISTED VAGINAL HYSTERECTOMY Bilateral 07/25/2017   Procedure: LAPAROSCOPIC ASSISTED VAGINAL HYSTERECTOMY WITH BILATERAL SALPING OOPHERECTOMY;  Surgeon: Janit Alm Agent, MD;  Location: [facility] ORS;  Service: Gynecology;  Laterality: Bilateral;   POSTERIOR LUMBAR FUSION 2 WITH HARDWARE REMOVAL Left 10/18/2023   Procedure: ARTHROSCOPY, SHOULDER WITH DEBRIDEMENT, LEFT;  Surgeon: Addie Cordella Hamilton, MD;  Location: [facility] OR;  Service: Orthopedics;  Laterality: Left;  left shoulder arthroscopy,  revision rotator cuff tear repair, possible cuff mend   SHOULDER ARTHROSCOPY WITH OPEN ROTATOR CUFF REPAIR AND DISTAL CLAVICLE ACROMINECTOMY Left 01/27/2023   Procedure: LEFT SHOULDER ARTHROSCOPY, DEBRIDEMENT, DISTAL CLAVICLE EXCISION, MINI OPEN ROTATOR CUFF TEAR REPAIR;  Surgeon: Addie Cordella Hamilton, MD;  Location: [facility] OR;  Service: Orthopedics;  Laterality: Left;   SHOULDER OPEN ROTATOR CUFF REPAIR Left 10/18/2023   Procedure: REPAIR, ROTATOR CUFF, OPEN;  Surgeon: Addie Cordella Hamilton, MD;  Location: [facility] OR;  Service: Orthopedics;  Laterality: Left;   TUBAL LIGATION     Patient Active Problem List   Diagnosis Date Noted   Synovitis of left shoulder 11/06/2023   Acute cystitis with hematuria 08/04/2023   Sepsis secondary to UTI (HCC) 08/01/2023   Obesity (BMI 30-39.9) 08/01/2023   Vertigo 02/25/2023   Complete tear of left rotator cuff 02/08/2023   Biceps tendonitis, left 02/08/2023   Arthritis of left acromioclavicular joint 02/08/2023   Memory change 12/06/2022   Excessive daytime sleepiness 12/06/2022   Prediabetes 07/29/2022   Persistent cough for 3 weeks or longer 01/21/2022   Iron  deficiency anemia 11/30/2021   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) 11/27/2021   Vitamin D  deficiency 11/27/2021   Chronic fatigue 11/27/2021   Paranoid delusion (HCC) 11/27/2021   GERD (gastroesophageal reflux disease) 04/25/2019   Hyperlipidemia, mixed 03/02/2019   Benign essential HTN 02/18/2019   Recurrent cystitis 11/27/2012   Intractable migraine with status migrainosus 09/19/2011   Chronic constipation 06/25/2011    PCP: Gretta Comer POUR, NP  REFERRING PROVIDER: Addie Cordella Hamilton, MD  REFERRING DIAG: [phone] (ICD-10-CM) - Complete tear  of left rotator cuff, unspecified whether traumatic  Rationale for Evaluation and Treatment: Rehabilitation  THERAPY DIAG:  No diagnosis found.  ONSET DATE: dos: 10/18/23   SUBJECTIVE:                                                                                                                                                                                            SUBJECTIVE STATEMENT: *** Pt states she has soreness and and some burning in the arm.    PERTINENT HISTORY:  Pt is s/p Lt RTC revision on 10/18/23. She is no longer using her sling. Anemia, migraines, depression, HTN, recent sepsis hospitalization, Lt RTC repair 10/24  PAIN:  Are you having pain? Yes: NPRS scale: 4-5/10 Pain location: Lt shoulder Pain description: aching, throbbing Aggravating factors: accidentally moving arm Relieving factors: tylenol , oxy at night  PRECAUTIONS:  Shoulder and Other: PROM only until 11/30/23  RED FLAGS: None   WEIGHT BEARING RESTRICTIONS:  No  FALLS:  Has patient fallen in last 6 months? No  LIVING ENVIRONMENT: Lives with: lives with their spouse and lives with an adult companion Lives in: House/apartment  OCCUPATION:  Mudlogger  PLOF:  Independent and Leisure: spend time with grandchild  PATIENT GOALS:  Use arm without pain, regain function in arm   OBJECTIVE:  Note: Objective measures were completed at Evaluation unless otherwise noted.   PATIENT SURVEYS:  Patient-Specific Activity Scoring Scheme  0 represents "unable to perform." 10 represents "able to perform at prior level. 0 1 2 3 4 5 6 7 8 9  10 (Date and Score)   Activity Eval     1. Brushing hair 3     2. Working 0    3. UB dressing 4   4.Wash hair 3   Score 2.5    Total score = sum of the activity scores/number of activities Minimum detectable change (90%CI) for average score = 2 points Minimum detectable change (90%CI) for single activity score = 3 points    COGNITIVE STATUS: History of cognitive impairments - at baseline   SENSATION: WFL  POSTURE:  rounded shoulders and forward head  HAND DOMINANCE:  Right  GAIT: 11/09/23 Comments: independent   EDEMA: 11/09/23  Not formally measured but does appear to have some swelling in Lt shoulder and UE   UPPER EXTREMITY ROM:  ROM Right eval Left eval Left 11/15/23  Shoulder flexion  P: 116 P: 115  Shoulder abduction  P: 86 P: 100  Shoulder internal rotation  P: 60 in 45 deg abd (limited by abdomen)   Shoulder external rotation  P: 45 in 45 deg abduction    (  Blank rows = not tested)   UPPER EXTREMITY MMT:  11/09/23: Deferred due to post-op status and precautions  MMT Right eval Left eval  Shoulder flexion    Shoulder extension    Shoulder abduction    Shoulder adduction    Shoulder extension    Shoulder internal rotation    Shoulder external rotation    Middle trapezius    Lower trapezius    Elbow flexion    Elbow extension    Wrist flexion    Wrist extension    Wrist ulnar deviation    Wrist radial deviation    Wrist pronation    Wrist supination    Grip strength     (Blank rows = not tested)      TREATMENT DATE:    11/30/23 ***   11/25/23   TherEx Pendulums: flexion/extension, add/abd, circles both directions 20x each way Table slides: flexion, scaption x 15 holding end range x 5 seconds Upper trap stretch x 3 bil holding 25 seconds Left wrist and elbow AROM  Neuro Re-Ed Scapular squeezes 2x 10 holding 10 sec Shoulder shrugs 2x10 Manual:  PROM left shoulder to pt's tolerance Modalities Vasopneumatic 34 deg low compression to left shoulder sitting x 10 minutes     11/15/23:  TherEx Pendulums: flexion/extension, add/abd, circles both directions x 2 minutes each Table slides: flexion, scaption x 15 holding end range x 5 seconds Upper trap stretch x 3 bil holding 15-20 seconds Levator stretch x 3 bil holding 15-20 seconds Neuro Re-Ed Scapular squeezes x 10 holding 10 sec Manual:  PROM left shoulder to pt's tolerance Modalities Vasopneumatic 34 deg low compression to left shoulder sitting x 10 minutes                                                                                                                                11/09/23 TherEx See HEP - demonstrated with trial reps performed PRN, mod cues for comprehension    Self Care Educated on PT POC, clinical findings, and post op precautions     PATIENT EDUCATION:  Education details: HEP Person educated: Patient Education method: Programmer, multimedia, Facilities manager, and Handouts Education comprehension: verbalized understanding, returned demonstration, and needs further education  HOME EXERCISE PROGRAM: Access Code: [email or web address] URL: [email or web address] Date: 11/09/2023 Prepared by: Corean Ku  Exercises - Circular Shoulder Pendulum with Table Support  - 2-3 x daily - 7 x weekly - 1-2 sets - 10 reps - Flexion-Extension Shoulder Pendulum with Table Support  - 2-3 x daily - 7 x weekly - 1-2 sets - 10 reps - Horizontal Shoulder Pendulum with Table Support  - 2-3 x daily - 7 x weekly - 1-2 sets - 10 reps - Seated Shoulder Flexion Towel Slide at Table Top  - 2-3 x daily - 7 x weekly - 1-2 sets - 10 reps - Seated Shoulder Abduction Towel Slide at Table Top  - 2-3 x daily - 7 x weekly -  1-2 sets - 10 reps - Seated Shoulder External Rotation PROM on Table  - 2-3 x daily - 7 x weekly - 1-2 sets - 10 reps   ASSESSMENT:  CLINICAL IMPRESSION: *** Pt demonstrated good understanding of HEP after review.    OBJECTIVE IMPAIRMENTS: decreased coordination, decreased ROM, decreased strength, increased edema, increased fascial restrictions, increased muscle spasms, impaired UE functional use, and pain.   ACTIVITY LIMITATIONS: carrying, lifting, sleeping, bed mobility, bathing, toileting, dressing, reach over head, and hygiene/grooming  PARTICIPATION LIMITATIONS: meal prep, cleaning, laundry, driving, shopping, community activity, and occupation  PERSONAL FACTORS: Age, Past/current experiences, Time since onset of injury/illness/exacerbation, and 3+ comorbidities: Anemia, migraines,  depression, HTN, recent sepsis hospitalization, Lt RTC repair 10/24 are also affecting patient's functional outcome.   REHAB POTENTIAL: Good  CLINICAL DECISION MAKING: Evolving/moderate complexity  EVALUATION COMPLEXITY: Moderate   GOALS: Goals reviewed with patient? Yes  SHORT TERM GOALS: Target date: 12/07/2023  Independent with initial HEP Goal status: ongoing 11/15/23  2.  Lt shoulder PROM improved 15 deg all motions for improved mobility Goal status: INITIAL  3.  Report pain < 4/10 in Lt shoulder  Goal status: INITIAL   LONG TERM GOALS: Target date: 02/01/2024  Independent with final HEP Goal status: INITIAL  2.  PSFS score improved by 3 points Goal status: INITIAL  3.  Lt shoulder AROM improved to [facility] for improved function and strength Goal status: INIITAL  4.  Report pain < 2/10 with reaching and ADLs for improved function Goal status: INITIAL  5.  Demonstrate at least 4/5 Lt shoulder strength in order to safety return to work Goal status: INITIAL  6.  Perform work simulated activities without increase in pain to safely return to work Goal status: INITIAL     PLAN:  PT FREQUENCY: 1x/wk x 3 weeks, then 2x/wk x 9 weeks  PT DURATION: 12 weeks  PLANNED INTERVENTIONS: 97164- PT Re-evaluation, 97750- Physical Performance Testing, 97110-Therapeutic exercises, 97530- Therapeutic activity, [id number]- Neuromuscular re-education, 97535- Self Care, 02859- Manual therapy, [id number]- Aquatic Therapy, H9716- Electrical stimulation (unattended), 97016- Vasopneumatic device, [id number]- Ultrasound, [id number]- Ionotophoresis 4mg /ml Dexamethasone , 79439 (1-2 muscles), 20561 (3+ muscles)- Dry Needling, Patient/Family education, Taping, Joint mobilization, Cryotherapy, and Moist heat.  PLAN FOR NEXT SESSION:  *** continue PROM only at this time, vaso PRN   PRECAUTIONS: passive range of motion only for 3 weeks (until 11/30/23)   NEXT MD VISIT: 11/17/23   Corean JULIANNA Ku, PT,  MPT 11/30/2023, 7:26 AM

## 2023-12-02 ENCOUNTER — Other Ambulatory Visit: Payer: Self-pay | Admitting: *Deleted

## 2023-12-02 DIAGNOSIS — D509 Iron deficiency anemia, unspecified: Secondary | ICD-10-CM

## 2023-12-05 ENCOUNTER — Inpatient Hospital Stay (HOSPITAL_BASED_OUTPATIENT_CLINIC_OR_DEPARTMENT_OTHER): Admitting: Nurse Practitioner

## 2023-12-05 ENCOUNTER — Inpatient Hospital Stay

## 2023-12-05 ENCOUNTER — Inpatient Hospital Stay: Attending: Nurse Practitioner

## 2023-12-05 VITALS — BP 133/85 | HR 91 | Resp 18 | Ht 60.0 in | Wt 158.0 lb

## 2023-12-05 VITALS — BP 120/74 | HR 74 | Resp 16

## 2023-12-05 DIAGNOSIS — Z79899 Other long term (current) drug therapy: Secondary | ICD-10-CM | POA: Insufficient documentation

## 2023-12-05 DIAGNOSIS — D509 Iron deficiency anemia, unspecified: Secondary | ICD-10-CM

## 2023-12-05 LAB — CBC WITH DIFFERENTIAL/PLATELET
Abs Immature Granulocytes: 0.01 K/uL (ref 0.00–0.07)
Basophils Absolute: 0 K/uL (ref 0.0–0.1)
Basophils Relative: 1 %
Eosinophils Absolute: 0.4 K/uL (ref 0.0–0.5)
Eosinophils Relative: 8 %
HCT: 35.7 % — ABNORMAL LOW (ref 36.0–46.0)
Hemoglobin: 11.1 g/dL — ABNORMAL LOW (ref 12.0–15.0)
Immature Granulocytes: 0 %
Lymphocytes Relative: 24 %
Lymphs Abs: 1.1 K/uL (ref 0.7–4.0)
MCH: 25.3 pg — ABNORMAL LOW (ref 26.0–34.0)
MCHC: 31.1 g/dL (ref 30.0–36.0)
MCV: 81.3 fL (ref 80.0–100.0)
Monocytes Absolute: 0.3 K/uL (ref 0.1–1.0)
Monocytes Relative: 6 %
Neutro Abs: 2.9 K/uL (ref 1.7–7.7)
Neutrophils Relative %: 61 %
Platelets: 291 K/uL (ref 150–400)
RBC: 4.39 MIL/uL (ref 3.87–5.11)
RDW: 17.1 % — ABNORMAL HIGH (ref 11.5–15.5)
WBC: 4.7 K/uL (ref 4.0–10.5)
nRBC: 0 % (ref 0.0–0.2)

## 2023-12-05 LAB — FERRITIN: Ferritin: 6 ng/mL — ABNORMAL LOW (ref 11–307)

## 2023-12-05 LAB — IRON AND TIBC
Iron: 18 ug/dL — ABNORMAL LOW (ref 28–170)
Saturation Ratios: 4 % — ABNORMAL LOW (ref 10.4–31.8)
TIBC: 416 ug/dL (ref 250–450)
UIBC: 398 ug/dL

## 2023-12-05 MED ORDER — IRON SUCROSE 20 MG/ML IV SOLN
200.0000 mg | Freq: Once | INTRAVENOUS | Status: AC
  Administered 2023-12-05: 200 mg via INTRAVENOUS
  Filled 2023-12-05: qty 10

## 2023-12-05 NOTE — Addendum Note (Signed)
 Addended by: BETHENE ALVIN RAMAN on: 12/05/2023 02:37 PM   Modules accepted: Orders

## 2023-12-05 NOTE — Addendum Note (Signed)
 Addended by: Jaime Dome G on: 12/05/2023 03:10 PM   Modules accepted: Orders

## 2023-12-05 NOTE — Progress Notes (Signed)
 [facility]  [phone] Fax:([phone]  ID: [name] OB: 04/03/1966  MR#: [id number]  RDW#:[id number]  Patient Care Team: Gretta Comer POUR, NP as PCP - General (Internal Medicine) Clista Bimler, MD as Consulting Physician (Oncology) Neda Jennet LABOR, MD as Consulting Physician (Pulmonary Disease)  REFERRING PROVIDER: Comer Gretta, NP  REASON FOR REFERRAL: IDA  HPI: [name] is a 58 y.o. female with past medical history of celiac disease, depression, hypertension, GERD, headaches was referred to hematology for management of iron  deficiency anemia.  Patient reports that she was seen by PCP in August 2023 when she was having depression issues.  At that time iron  panel was checked which was consistent with iron  deficiency.  She was started on oral iron .  Labs were repeated in 3 weeks which showed normal iron  panel and patient discontinued oral iron .  Since she had a history of iron  deficiency, she had labs rechecked on 04/21/2022. Ferritin of 3.  Saturation 8%.  CBC showed hemoglobin 11.4.  WBC and platelets were normal.  CMP was largely unremarkable.  Vitamin B12 324.  TSH normal.  Patient denies any bleeding in stools or urine.  Denies any melanotic stools.  Uses NSAIDs occasionally.    Colonoscopy done by Dr. Therisa on 05/11/2022 showed multiple diverticulosis.  Repeat in 10 years was recommended.  Upper endoscopy showed large hiatal hernia, LA grade 3 esophagitis with no bleeding in the lower third of the esophagus.  Was advised omeprazole  40 mg twice daily for 3 months.  Repeat endoscopy in 2 months to assess response to treatment.  Completed IV Venofer  200 mg x 4 doses on 06/10/2022.  Tolerated well.  Reported history of celiac disease.  Celiac panel was checked but came back negative.  Duodenal biopsies also came back negative.  Interval history: [name] returns to clinic for follow up of iron  deficiency. Endorses generalized fatigue. Denies  any neurologic complaints. Denies recent fevers or illnesses. Denies any easy bleeding or bruising. No melena or hematochezia. No pica or restless leg. Reports good appetite and denies weight loss. Denies chest pain. Denies any nausea, vomiting, constipation, or diarrhea. Denies urinary complaints. Patient offers no further specific complaints today.   REVIEW OF SYSTEMS:   Review of Systems  Constitutional:  Positive for malaise/fatigue. Negative for chills, fever and weight loss.  HENT:  Negative for hearing loss, nosebleeds, sore throat and tinnitus.   Eyes:  Negative for blurred vision and double vision.  Respiratory:  Negative for cough, hemoptysis, shortness of breath and wheezing.   Cardiovascular:  Negative for chest pain, palpitations and leg swelling.  Gastrointestinal:  Negative for abdominal pain, blood in stool, constipation, diarrhea, melena, nausea and vomiting.  Genitourinary:  Negative for dysuria and urgency.  Musculoskeletal:  Negative for back pain, falls, joint pain and myalgias.  Skin:  Negative for itching and rash.  Neurological:  Negative for dizziness, tingling, sensory change, loss of consciousness, weakness and headaches.  Endo/Heme/Allergies:  Negative for environmental allergies. Does not bruise/bleed easily.  Psychiatric/Behavioral:  Negative for depression. The patient is not nervous/anxious and does not have insomnia.   As per HPI. Otherwise, a complete review of systems is negative.  PAST MEDICAL HISTORY: Past Medical History:  Diagnosis Date   Anemia    BV (bacterial vaginosis) 11/27/2012   Celiac disease    Cough due to ACE inhibitor 04/25/2019   Depression    Essential hypertension    Family history of adverse reaction to anesthesia    MOM-HARD  TIME WAKING UP   GERD (gastroesophageal reflux disease)    Migraine with visual aura    MIGRAINES   UTI (lower urinary tract infection)    PAST SURGICAL HISTORY: Past Surgical History:  Procedure  Laterality Date   ABDOMINAL HYSTERECTOMY     BICEPT TENODESIS Left 01/27/2023   Procedure: BICEPS TENODESIS;  Surgeon: Addie Cordella Hamilton, MD;  Location: [facility] OR;  Service: Orthopedics;  Laterality: Left;   BLADDER SUSPENSION     COLONOSCOPY WITH PROPOFOL  N/A 05/11/2022   Procedure: COLONOSCOPY WITH PROPOFOL ;  Surgeon: Therisa Bi, MD;  Location: [facility] ENDOSCOPY;  Service: Gastroenterology;  Laterality: N/A;   ESOPHAGOGASTRODUODENOSCOPY (EGD) WITH PROPOFOL  N/A 05/11/2022   Procedure: ESOPHAGOGASTRODUODENOSCOPY (EGD) WITH PROPOFOL ;  Surgeon: Therisa Bi, MD;  Location: [facility] ENDOSCOPY;  Service: Gastroenterology;  Laterality: N/A;   EXPLORATORY LAPAROTOMY     IUD REMOVAL  07/25/2017   Procedure: INTRAUTERINE DEVICE (IUD) REMOVAL;  Surgeon: Janit Alm Agent, MD;  Location: [facility] ORS;  Service: Gynecology;;   LAPAROSCOPIC ASSISTED VAGINAL HYSTERECTOMY Bilateral 07/25/2017   Procedure: LAPAROSCOPIC ASSISTED VAGINAL HYSTERECTOMY WITH BILATERAL SALPING OOPHERECTOMY;  Surgeon: Janit Alm Agent, MD;  Location: [facility] ORS;  Service: Gynecology;  Laterality: Bilateral;   POSTERIOR LUMBAR FUSION 2 WITH HARDWARE REMOVAL Left 10/18/2023   Procedure: ARTHROSCOPY, SHOULDER WITH DEBRIDEMENT, LEFT;  Surgeon: Addie Cordella Hamilton, MD;  Location: [facility] OR;  Service: Orthopedics;  Laterality: Left;  left shoulder arthroscopy, revision rotator cuff tear repair, possible cuff mend   SHOULDER ARTHROSCOPY WITH OPEN ROTATOR CUFF REPAIR AND DISTAL CLAVICLE ACROMINECTOMY Left 01/27/2023   Procedure: LEFT SHOULDER ARTHROSCOPY, DEBRIDEMENT, DISTAL CLAVICLE EXCISION, MINI OPEN ROTATOR CUFF TEAR REPAIR;  Surgeon: Addie Cordella Hamilton, MD;  Location: [facility] OR;  Service: Orthopedics;  Laterality: Left;   SHOULDER OPEN ROTATOR CUFF REPAIR Left 10/18/2023   Procedure: REPAIR, ROTATOR CUFF, OPEN;  Surgeon: Addie Cordella Hamilton, MD;  Location: [facility] OR;  Service: Orthopedics;  Laterality: Left;   TUBAL LIGATION     FAMILY HISTORY: Family History   Problem Relation Age of Onset   Hypertension Mother    Stroke Mother    Cancer Father    Breast cancer Maternal Aunt    Hypertension Maternal Grandmother    Colon cancer Maternal Grandmother    Breast cancer Maternal Grandmother    Skin cancer Maternal Grandmother    Hypertension Maternal Grandfather    Hypertension Paternal Grandmother    Diabetes Paternal Grandmother    Hypertension Paternal Grandfather    Diabetes Son 12       type 1   Breast cancer Other    HEALTH MAINTENANCE: Social History   Tobacco Use   Smoking status: Never   Smokeless tobacco: Never  Vaping Use   Vaping status: Never Used  Substance Use Topics   Alcohol use: No    Alcohol/week: 0.0 standard drinks of alcohol   Drug use: No   Allergies  Allergen Reactions   Topamax  [Topiramate ]     cant function    Tramadol      Cant function with it    Gluten Meal Diarrhea and Nausea Only        Ondansetron  Other (See Comments)    Helps her nausea, but makes HA worse    Current Outpatient Medications  Medication Sig Dispense Refill   celecoxib  (CELEBREX ) 100 MG capsule TAKE 1 CAPSULE BY MOUTH EVERY DAY 30 capsule 0   ferrous sulfate  325 (65 FE) MG tablet Take 1 tablet (325 mg total) by mouth daily with breakfast.  30 tablet 2   methocarbamol  (ROBAXIN ) 500 MG tablet Take 1 tablet (500 mg total) by mouth every 8 (eight) hours as needed for muscle spasms. 30 tablet 0   omeprazole  (PRILOSEC) 20 MG capsule Take 20 mg by mouth daily.     oxycodone  (OXY-IR) 5 MG capsule Take 1 capsule (5 mg total) by mouth every 12 (twelve) hours as needed. 30 capsule 0   No current facility-administered medications for this visit.    OBJECTIVE: Vitals:   12/05/23 1403  Pulse: 91  Resp: 18  SpO2: 98%     Body mass index is 30.86 kg/m.      General: Well-developed, well-nourished, no acute distress. Eyes: Pink conjunctiva, anicteric sclera. Lungs: Clear to auscultation bilaterally.  No audible wheezing or  coughing Heart: Regular rate and rhythm.  Abdomen: Soft, nontender, nondistended.  Musculoskeletal: No edema, cyanosis, or clubbing. Neuro: Alert, answering all questions appropriately. Cranial nerves grossly intact. Skin: No rashes or petechiae noted. Psych: Normal affect.    LAB RESULTS: Lab Results  Component Value Date   WBC 4.7 12/05/2023   NEUTROABS 2.9 12/05/2023   HGB 11.1 (L) 12/05/2023   HCT 35.7 (L) 12/05/2023   MCV 81.3 12/05/2023   PLT 291 12/05/2023   Lab Results  Component Value Date   TIBC 333 08/01/2023   TIBC 447 03/04/2023   TIBC 470.4 (H) 12/06/2022   FERRITIN 31 08/01/2023   FERRITIN 5 (L) 03/04/2023   FERRITIN 4.2 (L) 12/06/2022   IRONPCTSAT 3 (L) 08/01/2023   IRONPCTSAT 6 (L) 03/04/2023   IRONPCTSAT 4.5 (L) 12/06/2022   STUDIES: No results found.  ASSESSMENT AND PLAN:   [name] is a 58 y.o. female with pmh of celiac disease, depression, hypertension, GERD, headaches was referred to hematology for management of iron  deficiency anemia.  # Iron  Deficiency Anemia - Completed IV Venofer  200 mg x 4 doses in February 2024.    - Colonoscopy done by Dr. Therisa on 05/11/2022 showed multiple diverticulosis.  Repeat in 10 years was recommended.  Upper endoscopy showed large hiatal hernia, LA grade 3 esophagitis with no bleeding in the lower third of the esophagus.  Was advised omeprazole  40 mg twice daily for 3 months.  Repeat endoscopy in 2 months was advised to assess response to treatment.  Could not have repeat endoscopy due to family commitments.  - Celiac panel was negative.  - Today- hemoglobin improved to 11.1. Iron  studies pending. Given symptoms, recommend additional venofer  today. Additional doses based on iron  results.   - Etiology - she was previously scheduled for a capsule study with GI, Dr Therisa but suffered a rotator cuff tear and cancelled procedure. Today, I recommended that she follow up with Dr Therisa, now at [facility] for reevaluation.     Disposition:  Venofer  today 3 mo- lab (cbc, bmp, ferritin, iron  studies) Day to week later- MD to establish care, +/- venofer - la  No orders of the defined types were placed in this encounter.  Patient expressed understanding and was in agreement with this plan. She also understands that She can call clinic at any time with any questions, concerns, or complaints.   I spent a total of 20 minutes reviewing chart data, face-to-face evaluation with the patient, counseling and coordination of care as detailed above.  Tinnie KANDICE Dawn, NP 12/05/2023

## 2023-12-07 ENCOUNTER — Encounter: Payer: Self-pay | Admitting: Physical Therapy

## 2023-12-07 ENCOUNTER — Ambulatory Visit: Payer: Self-pay | Admitting: Nurse Practitioner

## 2023-12-07 ENCOUNTER — Ambulatory Visit (INDEPENDENT_AMBULATORY_CARE_PROVIDER_SITE_OTHER): Admitting: Physical Therapy

## 2023-12-07 ENCOUNTER — Telehealth: Payer: Self-pay | Admitting: Orthopedic Surgery

## 2023-12-07 DIAGNOSIS — M25512 Pain in left shoulder: Secondary | ICD-10-CM | POA: Diagnosis not present

## 2023-12-07 DIAGNOSIS — R600 Localized edema: Secondary | ICD-10-CM | POA: Diagnosis not present

## 2023-12-07 DIAGNOSIS — M6281 Muscle weakness (generalized): Secondary | ICD-10-CM

## 2023-12-07 DIAGNOSIS — M25612 Stiffness of left shoulder, not elsewhere classified: Secondary | ICD-10-CM

## 2023-12-07 DIAGNOSIS — G8929 Other chronic pain: Secondary | ICD-10-CM

## 2023-12-07 DIAGNOSIS — R293 Abnormal posture: Secondary | ICD-10-CM

## 2023-12-07 NOTE — Telephone Encounter (Signed)
 Btwn 3 - 4 mos post op

## 2023-12-07 NOTE — Telephone Encounter (Signed)
 Patient is here for PT. Would like a note for work stating how long she is out and what date she can return.

## 2023-12-07 NOTE — Therapy (Signed)
 OUTPATIENT PHYSICAL THERAPY TREATMENT  Patient Name: [name] MRN: [id number] DOB:[birth year], 58 y.o., female Today's Date: 12/07/2023  END OF SESSION:  PT End of Session - 12/07/23 0929     Visit Number 4    Number of Visits 21    Date for PT Re-Evaluation 02/01/24    Authorization Type [employer]    PT Start Time 0930    PT Stop Time 1009    PT Time Calculation (min) 39 min    Activity Tolerance Patient tolerated treatment well    Behavior During Therapy [facility] for tasks assessed/performed              Past Medical History:  Diagnosis Date   Anemia    BV (bacterial vaginosis) 11/27/2012   Celiac disease    Cough due to ACE inhibitor 04/25/2019   Depression    Essential hypertension    Family history of adverse reaction to anesthesia    MOM-HARD TIME WAKING UP   GERD (gastroesophageal reflux disease)    Migraine with visual aura    MIGRAINES   UTI (lower urinary tract infection)    Past Surgical History:  Procedure Laterality Date   ABDOMINAL HYSTERECTOMY     BICEPT TENODESIS Left 01/27/2023   Procedure: BICEPS TENODESIS;  Surgeon: Addie Cordella Hamilton, MD;  Location: [facility] OR;  Service: Orthopedics;  Laterality: Left;   BLADDER SUSPENSION     COLONOSCOPY WITH PROPOFOL  N/A 05/11/2022   Procedure: COLONOSCOPY WITH PROPOFOL ;  Surgeon: Therisa Bi, MD;  Location: [facility] ENDOSCOPY;  Service: Gastroenterology;  Laterality: N/A;   ESOPHAGOGASTRODUODENOSCOPY (EGD) WITH PROPOFOL  N/A 05/11/2022   Procedure: ESOPHAGOGASTRODUODENOSCOPY (EGD) WITH PROPOFOL ;  Surgeon: Therisa Bi, MD;  Location: [facility] ENDOSCOPY;  Service: Gastroenterology;  Laterality: N/A;   EXPLORATORY LAPAROTOMY     IUD REMOVAL  07/25/2017   Procedure: INTRAUTERINE DEVICE (IUD) REMOVAL;  Surgeon: Janit Alm Agent, MD;  Location: [facility] ORS;  Service: Gynecology;;   LAPAROSCOPIC ASSISTED VAGINAL HYSTERECTOMY Bilateral 07/25/2017   Procedure: LAPAROSCOPIC ASSISTED VAGINAL HYSTERECTOMY WITH BILATERAL SALPING  OOPHERECTOMY;  Surgeon: Janit Alm Agent, MD;  Location: [facility] ORS;  Service: Gynecology;  Laterality: Bilateral;   POSTERIOR LUMBAR FUSION 2 WITH HARDWARE REMOVAL Left 10/18/2023   Procedure: ARTHROSCOPY, SHOULDER WITH DEBRIDEMENT, LEFT;  Surgeon: Addie Cordella Hamilton, MD;  Location: [facility] OR;  Service: Orthopedics;  Laterality: Left;  left shoulder arthroscopy, revision rotator cuff tear repair, possible cuff mend   SHOULDER ARTHROSCOPY WITH OPEN ROTATOR CUFF REPAIR AND DISTAL CLAVICLE ACROMINECTOMY Left 01/27/2023   Procedure: LEFT SHOULDER ARTHROSCOPY, DEBRIDEMENT, DISTAL CLAVICLE EXCISION, MINI OPEN ROTATOR CUFF TEAR REPAIR;  Surgeon: Addie Cordella Hamilton, MD;  Location: [facility] OR;  Service: Orthopedics;  Laterality: Left;   SHOULDER OPEN ROTATOR CUFF REPAIR Left 10/18/2023   Procedure: REPAIR, ROTATOR CUFF, OPEN;  Surgeon: Addie Cordella Hamilton, MD;  Location: [facility] OR;  Service: Orthopedics;  Laterality: Left;   TUBAL LIGATION     Patient Active Problem List   Diagnosis Date Noted   Synovitis of left shoulder 11/06/2023   Acute cystitis with hematuria 08/04/2023   Sepsis secondary to UTI (HCC) 08/01/2023   Obesity (BMI 30-39.9) 08/01/2023   Vertigo 02/25/2023   Complete tear of left rotator cuff 02/08/2023   Biceps tendonitis, left 02/08/2023   Arthritis of left acromioclavicular joint 02/08/2023   Memory change 12/06/2022   Excessive daytime sleepiness 12/06/2022   Prediabetes 07/29/2022   Persistent cough for 3 weeks or longer 01/21/2022   Iron  deficiency anemia 11/30/2021   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) 11/27/2021   Vitamin D  deficiency 11/27/2021   Chronic fatigue 11/27/2021   Paranoid delusion (HCC) 11/27/2021   GERD (gastroesophageal reflux disease) 04/25/2019   Hyperlipidemia, mixed 03/02/2019   Benign essential HTN 02/18/2019   Recurrent cystitis 11/27/2012   Intractable migraine with status migrainosus 09/19/2011   Chronic  constipation 06/25/2011    PCP: Gretta Comer POUR, NP  REFERRING PROVIDER: Addie Cordella Hamilton, MD  REFERRING DIAG: [phone] (ICD-10-CM) - Complete tear of left rotator cuff, unspecified whether traumatic  Rationale for Evaluation and Treatment: Rehabilitation  THERAPY DIAG:  Muscle weakness (generalized)  Localized edema  Stiffness of left shoulder, not elsewhere classified  Chronic left shoulder pain  Abnormal posture  ONSET DATE: dos: 10/18/23   SUBJECTIVE:                                                                                                                                                                                           SUBJECTIVE STATEMENT: Doing well, trying to use her arm more   PERTINENT HISTORY:  Pt is s/p Lt RTC revision on 10/18/23. She is no longer using her sling. Anemia, migraines, depression, HTN, recent sepsis hospitalization, Lt RTC repair 10/24  PAIN:  Are you having pain? Yes: NPRS scale: 4-5/10 Pain location: Lt shoulder Pain description: aching, throbbing Aggravating factors: accidentally moving arm Relieving factors: tylenol , oxy at night  PRECAUTIONS:  Shoulder and Other: PROM only until 11/30/23  RED FLAGS: None   WEIGHT BEARING RESTRICTIONS:  No  FALLS:  Has patient fallen in last 6 months? No  LIVING ENVIRONMENT: Lives with: lives with their spouse and lives with an adult companion Lives in: House/apartment  OCCUPATION:  Mudlogger  PLOF:  Independent and Leisure: spend time with grandchild  PATIENT GOALS:  Use arm without pain, regain function in arm   OBJECTIVE:  Note: Objective measures were completed at Evaluation unless otherwise noted.   PATIENT SURVEYS:  Patient-Specific Activity Scoring Scheme  0 represents "unable to perform." 10 represents "able to perform at prior level. 0 1 2 3 4 5 6 7 8 9  10 (Date and Score)   Activity Eval     1. Brushing hair 3     2. Working 0    3.  UB dressing 4   4.Wash hair 3   Score 2.5    Total score = sum of the activity scores/number of activities Minimum detectable change (90%CI) for average score = 2 points Minimum detectable change (90%CI) for single activity score = 3 points    COGNITIVE STATUS: History of cognitive impairments - at  baseline   SENSATION: WFL  POSTURE:  rounded shoulders and forward head  HAND DOMINANCE:  Right  GAIT: 11/09/23 Comments: independent   EDEMA: 11/09/23 Not formally measured but does appear to have some swelling in Lt shoulder and UE   UPPER EXTREMITY ROM:  ROM Right eval Left eval Left 11/15/23 Left 12/07/23  Shoulder flexion  P: 116 P: 115 A: 132 (supine) A: 114 (standing)  Shoulder abduction  P: 86 P: 100 A: 90 (sidelying) A: 85 (standing)  Shoulder internal rotation  P: 60 in 45 deg abd (limited by abdomen)    Shoulder external rotation  P: 45 in 45 deg abduction  A: 60 (standing)   (Blank rows = not tested)   UPPER EXTREMITY MMT:  11/09/23: Deferred due to post-op status and precautions  MMT Right eval Left eval  Shoulder flexion    Shoulder extension    Shoulder abduction    Shoulder adduction    Shoulder extension    Shoulder internal rotation    Shoulder external rotation    Middle trapezius    Lower trapezius    Elbow flexion    Elbow extension    Wrist flexion    Wrist extension    Wrist ulnar deviation    Wrist radial deviation    Wrist pronation    Wrist supination    Grip strength     (Blank rows = not tested)      TREATMENT DATE:    12/07/23 TherEx ROM measurements - see above for details  TherAct AA supine shoulder flexion 2# bar 2x10 with 3 sec hold at end range Lt chest press then overhead flexion 1# 2x10 Sidelying Lt shoulder abduction to 90 deg 2x10 Sidelying Lt ER 1# 2x10 Rows L3 band 2x10; 5 sec hold IR/ER reactive isometrics L1 band 2x10   11/25/23   TherEx Pendulums: flexion/extension, add/abd, circles both  directions 20x each way Table slides: flexion, scaption x 15 holding end range x 5 seconds Upper trap stretch x 3 bil holding 25 seconds Left wrist and elbow AROM  Neuro Re-Ed Scapular squeezes 2x 10 holding 10 sec Shoulder shrugs 2x10 Manual:  PROM left shoulder to pt's tolerance Modalities Vasopneumatic 34 deg low compression to left shoulder sitting x 10 minutes     11/15/23:  TherEx Pendulums: flexion/extension, add/abd, circles both directions x 2 minutes each Table slides: flexion, scaption x 15 holding end range x 5 seconds Upper trap stretch x 3 bil holding 15-20 seconds Levator stretch x 3 bil holding 15-20 seconds Neuro Re-Ed Scapular squeezes x 10 holding 10 sec Manual:  PROM left shoulder to pt's tolerance Modalities Vasopneumatic 34 deg low compression to left shoulder sitting x 10 minutes                                                                                                                               11/09/23 TherEx See HEP - demonstrated with trial reps  performed PRN, mod cues for comprehension    Self Care Educated on PT POC, clinical findings, and post op precautions     PATIENT EDUCATION:  Education details: HEP Person educated: Patient Education method: Programmer, multimedia, Facilities manager, and Handouts Education comprehension: verbalized understanding, returned demonstration, and needs further education  HOME EXERCISE PROGRAM: Access Code: [email or web address] URL: [email or web address] Date: 11/09/2023 Prepared by: Corean Ku  Exercises - Circular Shoulder Pendulum with Table Support  - 2-3 x daily - 7 x weekly - 1-2 sets - 10 reps - Flexion-Extension Shoulder Pendulum with Table Support  - 2-3 x daily - 7 x weekly - 1-2 sets - 10 reps - Horizontal Shoulder Pendulum with Table Support  - 2-3 x daily - 7 x weekly - 1-2 sets - 10 reps - Seated Shoulder Flexion Towel Slide at Table Top  - 2-3 x daily - 7 x weekly - 1-2 sets - 10  reps - Seated Shoulder Abduction Towel Slide at Table Top  - 2-3 x daily - 7 x weekly - 1-2 sets - 10 reps - Seated Shoulder External Rotation PROM on Table  - 2-3 x daily - 7 x weekly - 1-2 sets - 10 reps   ASSESSMENT:  CLINICAL IMPRESSION: Pt tolerated progression to AROM and light strengthening exercises well today.  Overall still with ROM and strength limitations affecting functional mobility.  Continue skilled PT.   OBJECTIVE IMPAIRMENTS: decreased coordination, decreased ROM, decreased strength, increased edema, increased fascial restrictions, increased muscle spasms, impaired UE functional use, and pain.   ACTIVITY LIMITATIONS: carrying, lifting, sleeping, bed mobility, bathing, toileting, dressing, reach over head, and hygiene/grooming  PARTICIPATION LIMITATIONS: meal prep, cleaning, laundry, driving, shopping, community activity, and occupation  PERSONAL FACTORS: Age, Past/current experiences, Time since onset of injury/illness/exacerbation, and 3+ comorbidities: Anemia, migraines, depression, HTN, recent sepsis hospitalization, Lt RTC repair 10/24 are also affecting patient's functional outcome.   REHAB POTENTIAL: Good  CLINICAL DECISION MAKING: Evolving/moderate complexity  EVALUATION COMPLEXITY: Moderate   GOALS: Goals reviewed with patient? Yes  SHORT TERM GOALS: Target date: 12/07/2023  Independent with initial HEP Goal status: ongoing 11/15/23  2.  Lt shoulder PROM improved 15 deg all motions for improved mobility Goal status: INITIAL  3.  Report pain < 4/10 in Lt shoulder  Goal status: INITIAL   LONG TERM GOALS: Target date: 02/01/2024  Independent with final HEP Goal status: INITIAL  2.  PSFS score improved by 3 points Goal status: INITIAL  3.  Lt shoulder AROM improved to [facility] for improved function and strength Goal status: INIITAL  4.  Report pain < 2/10 with reaching and ADLs for improved function Goal status: INITIAL  5.  Demonstrate at least 4/5  Lt shoulder strength in order to safety return to work Goal status: INITIAL  6.  Perform work simulated activities without increase in pain to safely return to work Goal status: INITIAL     PLAN:  PT FREQUENCY: 1x/wk x 3 weeks, then 2x/wk x 9 weeks  PT DURATION: 12 weeks  PLANNED INTERVENTIONS: 97164- PT Re-evaluation, 97750- Physical Performance Testing, 97110-Therapeutic exercises, 97530- Therapeutic activity, [id number]- Neuromuscular re-education, 97535- Self Care, [id number]- Manual therapy, [id number]- Aquatic Therapy, H9716- Electrical stimulation (unattended), 97016- Vasopneumatic device, [id number]- Ultrasound, [id number]- Ionotophoresis 4mg /ml Dexamethasone , 79439 (1-2 muscles), 20561 (3+ muscles)- Dry Needling, Patient/Family education, Taping, Joint mobilization, Cryotherapy, and Moist heat.  PLAN FOR NEXT SESSION:  update HEP to include AROM/light strengthening, vaso PRN   PRECAUTIONS: full ROM and strengthening allowed starting 11/29/23   NEXT  MD VISIT: 12/19/23   Corean JULIANNA Ku, PT, MPT 12/07/2023, 10:12 AM

## 2023-12-08 ENCOUNTER — Inpatient Hospital Stay

## 2023-12-08 VITALS — BP 134/87 | HR 81 | Temp 98.1°F | Resp 19

## 2023-12-08 DIAGNOSIS — D509 Iron deficiency anemia, unspecified: Secondary | ICD-10-CM

## 2023-12-08 MED ORDER — IRON SUCROSE 20 MG/ML IV SOLN
200.0000 mg | Freq: Once | INTRAVENOUS | Status: AC
  Administered 2023-12-08: 200 mg via INTRAVENOUS
  Filled 2023-12-08: qty 10

## 2023-12-09 NOTE — Therapy (Signed)
 OUTPATIENT PHYSICAL THERAPY TREATMENT  Patient Name: [name] MRN: [id number] DOB:1966-01-28, 58 y.o., female Today's Date: 12/13/2023  END OF SESSION:  PT End of Session - 12/13/23 0957     Visit Number 5    Number of Visits 21    Date for PT Re-Evaluation 02/01/24    Authorization Type [employer]    PT Start Time 0915    PT Stop Time 0953    PT Time Calculation (min) 38 min    Activity Tolerance Patient tolerated treatment well    Behavior During Therapy [facility] for tasks assessed/performed               Past Medical History:  Diagnosis Date   Anemia    BV (bacterial vaginosis) 11/27/2012   Celiac disease    Cough due to ACE inhibitor 04/25/2019   Depression    Essential hypertension    Family history of adverse reaction to anesthesia    MOM-HARD TIME WAKING UP   GERD (gastroesophageal reflux disease)    Migraine with visual aura    MIGRAINES   UTI (lower urinary tract infection)    Past Surgical History:  Procedure Laterality Date   ABDOMINAL HYSTERECTOMY     BICEPT TENODESIS Left 01/27/2023   Procedure: BICEPS TENODESIS;  Surgeon: Addie Cordella Hamilton, MD;  Location: [facility] OR;  Service: Orthopedics;  Laterality: Left;   BLADDER SUSPENSION     COLONOSCOPY WITH PROPOFOL  N/A 05/11/2022   Procedure: COLONOSCOPY WITH PROPOFOL ;  Surgeon: Therisa Bi, MD;  Location: [facility] ENDOSCOPY;  Service: Gastroenterology;  Laterality: N/A;   ESOPHAGOGASTRODUODENOSCOPY (EGD) WITH PROPOFOL  N/A 05/11/2022   Procedure: ESOPHAGOGASTRODUODENOSCOPY (EGD) WITH PROPOFOL ;  Surgeon: Therisa Bi, MD;  Location: [facility] ENDOSCOPY;  Service: Gastroenterology;  Laterality: N/A;   EXPLORATORY LAPAROTOMY     IUD REMOVAL  07/25/2017   Procedure: INTRAUTERINE DEVICE (IUD) REMOVAL;  Surgeon: Janit Alm Agent, MD;  Location: [facility] ORS;  Service: Gynecology;;   LAPAROSCOPIC ASSISTED VAGINAL HYSTERECTOMY Bilateral 07/25/2017   Procedure: LAPAROSCOPIC ASSISTED VAGINAL HYSTERECTOMY WITH BILATERAL SALPING  OOPHERECTOMY;  Surgeon: Janit Alm Agent, MD;  Location: [facility] ORS;  Service: Gynecology;  Laterality: Bilateral;   POSTERIOR LUMBAR FUSION 2 WITH HARDWARE REMOVAL Left 10/18/2023   Procedure: ARTHROSCOPY, SHOULDER WITH DEBRIDEMENT, LEFT;  Surgeon: Addie Cordella Hamilton, MD;  Location: [facility] OR;  Service: Orthopedics;  Laterality: Left;  left shoulder arthroscopy, revision rotator cuff tear repair, possible cuff mend   SHOULDER ARTHROSCOPY WITH OPEN ROTATOR CUFF REPAIR AND DISTAL CLAVICLE ACROMINECTOMY Left 01/27/2023   Procedure: LEFT SHOULDER ARTHROSCOPY, DEBRIDEMENT, DISTAL CLAVICLE EXCISION, MINI OPEN ROTATOR CUFF TEAR REPAIR;  Surgeon: Addie Cordella Hamilton, MD;  Location: [facility] OR;  Service: Orthopedics;  Laterality: Left;   SHOULDER OPEN ROTATOR CUFF REPAIR Left 10/18/2023   Procedure: REPAIR, ROTATOR CUFF, OPEN;  Surgeon: Addie Cordella Hamilton, MD;  Location: [facility] OR;  Service: Orthopedics;  Laterality: Left;   TUBAL LIGATION     Patient Active Problem List   Diagnosis Date Noted   Synovitis of left shoulder 11/06/2023   Acute cystitis with hematuria 08/04/2023   Sepsis secondary to UTI (HCC) 08/01/2023   Obesity (BMI 30-39.9) 08/01/2023   Vertigo 02/25/2023   Complete tear of left rotator cuff 02/08/2023   Biceps tendonitis, left 02/08/2023   Arthritis of left acromioclavicular joint 02/08/2023   Memory change 12/06/2022   Excessive daytime sleepiness 12/06/2022   Prediabetes 07/29/2022   Persistent cough for 3 weeks or longer 01/21/2022   Iron  deficiency anemia 11/30/2021   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) 11/27/2021   Vitamin D  deficiency 11/27/2021   Chronic fatigue 11/27/2021   Paranoid delusion (HCC) 11/27/2021   GERD (gastroesophageal reflux disease) 04/25/2019   Hyperlipidemia, mixed 03/02/2019   Benign essential HTN 02/18/2019   Recurrent cystitis 11/27/2012   Intractable migraine with status migrainosus 09/19/2011   Chronic  constipation 06/25/2011    PCP: Gretta Comer POUR, NP  REFERRING PROVIDER: Addie Cordella Hamilton, MD  REFERRING DIAG: [phone] (ICD-10-CM) - Complete tear of left rotator cuff, unspecified whether traumatic  Rationale for Evaluation and Treatment: Rehabilitation  THERAPY DIAG:  Muscle weakness (generalized)  Localized edema  Stiffness of left shoulder, not elsewhere classified  Chronic left shoulder pain  Abnormal posture  ONSET DATE: dos: 10/18/23   SUBJECTIVE:                                                                                                                                                                                           SUBJECTIVE STATEMENT: Pt reports reaching higher into some shelves.  Also not as sore.    PERTINENT HISTORY:  Pt is s/p Lt RTC revision on 10/18/23. She is no longer using her sling. Anemia, migraines, depression, HTN, recent sepsis hospitalization, Lt RTC repair 10/24  PAIN:  Are you having pain? Yes: NPRS scale: 1/10 Pain location: Lt shoulder Pain description: aching, throbbing Aggravating factors: accidentally moving arm Relieving factors: tylenol , oxy at night  PRECAUTIONS:  Shoulder and Other: PROM only until 11/30/23  RED FLAGS: None   WEIGHT BEARING RESTRICTIONS:  No  FALLS:  Has patient fallen in last 6 months? No  LIVING ENVIRONMENT: Lives with: lives with their spouse and lives with an adult companion Lives in: House/apartment  OCCUPATION:  Mudlogger  PLOF:  Independent and Leisure: spend time with grandchild  PATIENT GOALS:  Use arm without pain, regain function in arm   OBJECTIVE:  Note: Objective measures were completed at Evaluation unless otherwise noted.   PATIENT SURVEYS:  Patient-Specific Activity Scoring Scheme  0 represents "unable to perform." 10 represents "able to perform at prior level. 0 1 2 3 4 5 6 7 8 9  10 (Date and Score)   Activity Eval     1. Brushing hair 3      2. Working 0    3. UB dressing 4   4.Wash hair 3   Score 2.5    Total score = sum of the activity scores/number of activities Minimum detectable change (90%CI) for average score = 2 points Minimum detectable change (90%CI) for single activity score = 3 points    COGNITIVE STATUS:  History of cognitive impairments - at baseline   SENSATION: WFL  POSTURE:  rounded shoulders and forward head  HAND DOMINANCE:  Right  GAIT: 11/09/23 Comments: independent   EDEMA: 11/09/23 Not formally measured but does appear to have some swelling in Lt shoulder and UE   UPPER EXTREMITY ROM:  ROM Right eval Left eval Left 11/15/23 Left 12/07/23  Shoulder flexion  P: 116 P: 115 A: 132 (supine) A: 114 (standing)  Shoulder abduction  P: 86 P: 100 A: 90 (sidelying) A: 85 (standing)  Shoulder internal rotation  P: 60 in 45 deg abd (limited by abdomen)    Shoulder external rotation  P: 45 in 45 deg abduction  A: 60 (standing)   (Blank rows = not tested)   UPPER EXTREMITY MMT:  11/09/23: Deferred due to post-op status and precautions  MMT Right eval Left eval  Shoulder flexion    Shoulder extension    Shoulder abduction    Shoulder adduction    Shoulder extension    Shoulder internal rotation    Shoulder external rotation    Middle trapezius    Lower trapezius    Elbow flexion    Elbow extension    Wrist flexion    Wrist extension    Wrist ulnar deviation    Wrist radial deviation    Wrist pronation    Wrist supination    Grip strength     (Blank rows = not tested)      TREATMENT DATE:    12/13/23 TherEx UBE 2.5 each way   UT stretches TherAct IR/ER reactive isometrics R band 2x10 Tband Rows G 3x10 Chest press 2# bar 2x10 Sidelying Lt ER 1# 2x10 Sidelying Abd 2x10 Manual: PROM and scap mobs  12/07/23 TherEx ROM measurements - see above for details  TherAct AA supine shoulder flexion 2# bar 2x10 with 3 sec hold at end range Lt chest press then overhead  flexion 1# 2x10 Sidelying Lt shoulder abduction to 90 deg 2x10 Rows L3 band 2x10; 5 sec hold IR/ER reactive isometrics L1 band 2x10   11/25/23   TherEx Pendulums: flexion/extension, add/abd, circles both directions 20x each way Table slides: flexion, scaption x 15 holding end range x 5 seconds Upper trap stretch x 3 bil holding 25 seconds Left wrist and elbow AROM  Neuro Re-Ed Scapular squeezes 2x 10 holding 10 sec Shoulder shrugs 2x10 Manual:  PROM left shoulder to pt's tolerance Modalities Vasopneumatic 34 deg low compression to left shoulder sitting x 10 minutes     11/15/23:  TherEx Pendulums: flexion/extension, add/abd, circles both directions x 2 minutes each Table slides: flexion, scaption x 15 holding end range x 5 seconds Upper trap stretch x 3 bil holding 15-20 seconds Levator stretch x 3 bil holding 15-20 seconds Neuro Re-Ed Scapular squeezes x 10 holding 10 sec Manual:  PROM left shoulder to pt's tolerance Modalities Vasopneumatic 34 deg low compression to left shoulder sitting x 10 minutes  11/09/23 TherEx See HEP - demonstrated with trial reps performed PRN, mod cues for comprehension    Self Care Educated on PT POC, clinical findings, and post op precautions     PATIENT EDUCATION:  Education details: HEP Person educated: Patient Education method: Programmer, multimedia, Facilities manager, and Handouts Education comprehension: verbalized understanding, returned demonstration, and needs further education  HOME EXERCISE PROGRAM: Access Code: [email or web address] URL: [email or web address] Date: 11/09/2023 Prepared by: Corean Ku  Exercises - Circular Shoulder Pendulum with Table Support  - 2-3 x daily - 7 x weekly - 1-2 sets - 10 reps - Flexion-Extension Shoulder Pendulum with Table Support  - 2-3 x daily - 7 x weekly - 1-2 sets  - 10 reps - Horizontal Shoulder Pendulum with Table Support  - 2-3 x daily - 7 x weekly - 1-2 sets - 10 reps - Seated Shoulder Flexion Towel Slide at Table Top  - 2-3 x daily - 7 x weekly - 1-2 sets - 10 reps - Seated Shoulder Abduction Towel Slide at Table Top  - 2-3 x daily - 7 x weekly - 1-2 sets - 10 reps - Seated Shoulder External Rotation PROM on Table  - 2-3 x daily - 7 x weekly - 1-2 sets - 10 reps   ASSESSMENT:  CLINICAL IMPRESSION: Pt needed Vc for shoulder hiking with exercises.  Able to correct with VC.  Challenged bt resistive work but no pain.  OBJECTIVE IMPAIRMENTS: decreased coordination, decreased ROM, decreased strength, increased edema, increased fascial restrictions, increased muscle spasms, impaired UE functional use, and pain.   ACTIVITY LIMITATIONS: carrying, lifting, sleeping, bed mobility, bathing, toileting, dressing, reach over head, and hygiene/grooming  PARTICIPATION LIMITATIONS: meal prep, cleaning, laundry, driving, shopping, community activity, and occupation  PERSONAL FACTORS: Age, Past/current experiences, Time since onset of injury/illness/exacerbation, and 3+ comorbidities: Anemia, migraines, depression, HTN, recent sepsis hospitalization, Lt RTC repair 10/24 are also affecting patient's functional outcome.   REHAB POTENTIAL: Good  CLINICAL DECISION MAKING: Evolving/moderate complexity  EVALUATION COMPLEXITY: Moderate   GOALS: Goals reviewed with patient? Yes  SHORT TERM GOALS: Target date: 12/07/2023  Independent with initial HEP Goal status: ongoing 11/15/23  2.  Lt shoulder PROM improved 15 deg all motions for improved mobility Goal status: INITIAL  3.  Report pain < 4/10 in Lt shoulder  Goal status: INITIAL   LONG TERM GOALS: Target date: 02/01/2024  Independent with final HEP Goal status: INITIAL  2.  PSFS score improved by 3 points Goal status: INITIAL  3.  Lt shoulder AROM improved to [facility] for improved function and  strength Goal status: INIITAL  4.  Report pain < 2/10 with reaching and ADLs for improved function Goal status: INITIAL  5.  Demonstrate at least 4/5 Lt shoulder strength in order to safety return to work Goal status: INITIAL  6.  Perform work simulated activities without increase in pain to safely return to work Goal status: INITIAL     PLAN:  PT FREQUENCY: 1x/wk x 3 weeks, then 2x/wk x 9 weeks  PT DURATION: 12 weeks  PLANNED INTERVENTIONS: 97164- PT Re-evaluation, 97750- Physical Performance Testing, 97110-Therapeutic exercises, 97530- Therapeutic activity, [id number]- Neuromuscular re-education, 97535- Self Care, [id number]- Manual therapy, [id number]- Aquatic Therapy, [id number]- Electrical stimulation (unattended), 97016- Vasopneumatic device, [id number]- Ultrasound, [id number]- Ionotophoresis 4mg /ml Dexamethasone , 79439 (1-2 muscles), 20561 (3+ muscles)- Dry Needling, Patient/Family education, Taping, Joint mobilization, Cryotherapy, and Moist heat.  PLAN FOR NEXT SESSION:  Update HEP to include AROM/light strengthening, vaso PRN   PRECAUTIONS: full ROM and strengthening allowed starting  11/29/23   NEXT MD VISIT: 12/19/23   Burnard CHRISTELLA Meth, PT 12/13/2023, 9:58 AM

## 2023-12-13 ENCOUNTER — Other Ambulatory Visit: Payer: Self-pay | Admitting: Orthopedic Surgery

## 2023-12-13 ENCOUNTER — Ambulatory Visit (INDEPENDENT_AMBULATORY_CARE_PROVIDER_SITE_OTHER)

## 2023-12-13 DIAGNOSIS — G8929 Other chronic pain: Secondary | ICD-10-CM

## 2023-12-13 DIAGNOSIS — M6281 Muscle weakness (generalized): Secondary | ICD-10-CM

## 2023-12-13 DIAGNOSIS — M25612 Stiffness of left shoulder, not elsewhere classified: Secondary | ICD-10-CM

## 2023-12-13 DIAGNOSIS — R600 Localized edema: Secondary | ICD-10-CM | POA: Diagnosis not present

## 2023-12-13 DIAGNOSIS — M25512 Pain in left shoulder: Secondary | ICD-10-CM | POA: Diagnosis not present

## 2023-12-13 DIAGNOSIS — R293 Abnormal posture: Secondary | ICD-10-CM

## 2023-12-14 ENCOUNTER — Encounter: Payer: Self-pay | Admitting: Rehabilitative and Restorative Service Providers"

## 2023-12-14 ENCOUNTER — Ambulatory Visit (INDEPENDENT_AMBULATORY_CARE_PROVIDER_SITE_OTHER): Admitting: Rehabilitative and Restorative Service Providers"

## 2023-12-14 DIAGNOSIS — M25612 Stiffness of left shoulder, not elsewhere classified: Secondary | ICD-10-CM | POA: Diagnosis not present

## 2023-12-14 DIAGNOSIS — R600 Localized edema: Secondary | ICD-10-CM

## 2023-12-14 DIAGNOSIS — M6281 Muscle weakness (generalized): Secondary | ICD-10-CM | POA: Diagnosis not present

## 2023-12-14 DIAGNOSIS — M25512 Pain in left shoulder: Secondary | ICD-10-CM | POA: Diagnosis not present

## 2023-12-14 DIAGNOSIS — G8929 Other chronic pain: Secondary | ICD-10-CM

## 2023-12-14 DIAGNOSIS — R293 Abnormal posture: Secondary | ICD-10-CM

## 2023-12-14 NOTE — Therapy (Signed)
 OUTPATIENT PHYSICAL THERAPY TREATMENT  Patient Name: [name] MRN: [id number] DOB:February 17, 1966, 58 y.o., female Today's Date: 12/14/2023  END OF SESSION:  PT End of Session - 12/14/23 0909     Visit Number 6    Number of Visits 21    Date for PT Re-Evaluation 02/01/24    Authorization Type [employer]    PT Start Time 0902    PT Stop Time 0943    PT Time Calculation (min) 41 min    Activity Tolerance Patient tolerated treatment well    Behavior During Therapy [facility] for tasks assessed/performed                Past Medical History:  Diagnosis Date   Anemia    BV (bacterial vaginosis) 11/27/2012   Celiac disease    Cough due to ACE inhibitor 04/25/2019   Depression    Essential hypertension    Family history of adverse reaction to anesthesia    MOM-HARD TIME WAKING UP   GERD (gastroesophageal reflux disease)    Migraine with visual aura    MIGRAINES   UTI (lower urinary tract infection)    Past Surgical History:  Procedure Laterality Date   ABDOMINAL HYSTERECTOMY     BICEPT TENODESIS Left 01/27/2023   Procedure: BICEPS TENODESIS;  Surgeon: Addie Cordella Hamilton, MD;  Location: [facility] OR;  Service: Orthopedics;  Laterality: Left;   BLADDER SUSPENSION     COLONOSCOPY WITH PROPOFOL  N/A 05/11/2022   Procedure: COLONOSCOPY WITH PROPOFOL ;  Surgeon: Therisa Bi, MD;  Location: [facility] ENDOSCOPY;  Service: Gastroenterology;  Laterality: N/A;   ESOPHAGOGASTRODUODENOSCOPY (EGD) WITH PROPOFOL  N/A 05/11/2022   Procedure: ESOPHAGOGASTRODUODENOSCOPY (EGD) WITH PROPOFOL ;  Surgeon: Therisa Bi, MD;  Location: [facility] ENDOSCOPY;  Service: Gastroenterology;  Laterality: N/A;   EXPLORATORY LAPAROTOMY     IUD REMOVAL  07/25/2017   Procedure: INTRAUTERINE DEVICE (IUD) REMOVAL;  Surgeon: Janit Alm Agent, MD;  Location: [facility] ORS;  Service: Gynecology;;   LAPAROSCOPIC ASSISTED VAGINAL HYSTERECTOMY Bilateral 07/25/2017   Procedure: LAPAROSCOPIC ASSISTED VAGINAL HYSTERECTOMY WITH BILATERAL SALPING  OOPHERECTOMY;  Surgeon: Janit Alm Agent, MD;  Location: [facility] ORS;  Service: Gynecology;  Laterality: Bilateral;   POSTERIOR LUMBAR FUSION 2 WITH HARDWARE REMOVAL Left 10/18/2023   Procedure: ARTHROSCOPY, SHOULDER WITH DEBRIDEMENT, LEFT;  Surgeon: Addie Cordella Hamilton, MD;  Location: [facility] OR;  Service: Orthopedics;  Laterality: Left;  left shoulder arthroscopy, revision rotator cuff tear repair, possible cuff mend   SHOULDER ARTHROSCOPY WITH OPEN ROTATOR CUFF REPAIR AND DISTAL CLAVICLE ACROMINECTOMY Left 01/27/2023   Procedure: LEFT SHOULDER ARTHROSCOPY, DEBRIDEMENT, DISTAL CLAVICLE EXCISION, MINI OPEN ROTATOR CUFF TEAR REPAIR;  Surgeon: Addie Cordella Hamilton, MD;  Location: [facility] OR;  Service: Orthopedics;  Laterality: Left;   SHOULDER OPEN ROTATOR CUFF REPAIR Left 10/18/2023   Procedure: REPAIR, ROTATOR CUFF, OPEN;  Surgeon: Addie Cordella Hamilton, MD;  Location: [facility] OR;  Service: Orthopedics;  Laterality: Left;   TUBAL LIGATION     Patient Active Problem List   Diagnosis Date Noted   Synovitis of left shoulder 11/06/2023   Acute cystitis with hematuria 08/04/2023   Sepsis secondary to UTI (HCC) 08/01/2023   Obesity (BMI 30-39.9) 08/01/2023   Vertigo 02/25/2023   Complete tear of left rotator cuff 02/08/2023   Biceps tendonitis, left 02/08/2023   Arthritis of left acromioclavicular joint 02/08/2023   Memory change 12/06/2022   Excessive daytime sleepiness 12/06/2022   Prediabetes 07/29/2022   Persistent cough for 3 weeks or longer 01/21/2022   Iron  deficiency anemia 11/30/2021   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) 11/27/2021   Vitamin D  deficiency 11/27/2021   Chronic fatigue 11/27/2021   Paranoid delusion (HCC) 11/27/2021   GERD (gastroesophageal reflux disease) 04/25/2019   Hyperlipidemia, mixed 03/02/2019   Benign essential HTN 02/18/2019   Recurrent cystitis 11/27/2012   Intractable migraine with status migrainosus 09/19/2011   Chronic  constipation 06/25/2011    PCP: Gretta Comer POUR, NP  REFERRING PROVIDER: Addie Cordella Hamilton, MD  REFERRING DIAG: [phone] (ICD-10-CM) - Complete tear of left rotator cuff, unspecified whether traumatic  Rationale for Evaluation and Treatment: Rehabilitation  THERAPY DIAG:  Muscle weakness (generalized)  Localized edema  Stiffness of left shoulder, not elsewhere classified  Chronic left shoulder pain  Abnormal posture  ONSET DATE: dos: 10/18/23   SUBJECTIVE:                                                                                                                                                                                           SUBJECTIVE STATEMENT: Pt indicated nothing bad pain wise.  Feeling ok with progressions at this time.  Pt indicated better reaching recently.    PERTINENT HISTORY:  Pt is s/p Lt RTC revision on 10/18/23. She is no longer using her sling. Anemia, migraines, depression, HTN, recent sepsis hospitalization, Lt RTC repair 10/24  PAIN:  NPRS scale: up to 3/10 or so in last 24 hours.  Pain location: Lt shoulder Pain description: aching, throbbing Aggravating factors: accidentally moving arm Relieving factors: tylenol , oxy at night  PRECAUTIONS:  Shoulder and Other: PROM only until 11/30/23  RED FLAGS: None   WEIGHT BEARING RESTRICTIONS:  No  FALLS:  Has patient fallen in last 6 months? No  LIVING ENVIRONMENT: Lives with: lives with their spouse and lives with an adult companion Lives in: House/apartment  OCCUPATION:  Mudlogger  PLOF:  Independent and Leisure: spend time with grandchild  PATIENT GOALS:  Use arm without pain, regain function in arm   OBJECTIVE:  Note: Objective measures were completed at Evaluation unless otherwise noted.   PATIENT SURVEYS:  Patient-Specific Activity Scoring Scheme  0 represents "unable to perform." 10 represents "able to perform at prior level. 0 1 2 3 4 5 6 7 8 9   10 (Date and Score)   Activity Eval     1. Brushing hair 3     2. Working 0    3. UB dressing 4   4.Wash hair 3   Score 2.5    Total score = sum of the activity scores/number of activities Minimum detectable change (90%CI) for average score = 2 points Minimum detectable change (  90%CI) for single activity score = 3 points    COGNITIVE STATUS: History of cognitive impairments - at baseline   SENSATION: WFL  POSTURE:  rounded shoulders and forward head  HAND DOMINANCE:  Right  GAIT: 11/09/23 Comments: independent   EDEMA: 11/09/23 Not formally measured but does appear to have some swelling in Lt shoulder and UE   UPPER EXTREMITY ROM:  ROM Right eval Left eval Left 11/15/23 Left 12/07/23 Left 12/14/2023  Shoulder flexion  P: 116 P: 115 A: 132 (supine) A: 114 (standing) 134 AROM in standing  Shoulder abduction  P: 86 P: 100 A: 90 (sidelying) A: 85 (standing)   Shoulder internal rotation  P: 60 in 45 deg abd (limited by abdomen)     Shoulder external rotation  P: 45 in 45 deg abduction  A: 60 (standing)    (Blank rows = not tested)   UPPER EXTREMITY MMT:  11/09/23: Deferred due to post-op status and precautions  MMT Right eval Left eval Left 12/14/2023  Shoulder flexion   4/5  Shoulder extension     Shoulder abduction     Shoulder adduction     Shoulder extension     Shoulder internal rotation     Shoulder external rotation     Middle trapezius     Lower trapezius     Elbow flexion     Elbow extension     Wrist flexion     Wrist extension     Wrist ulnar deviation     Wrist radial deviation     Wrist pronation     Wrist supination     Grip strength      (Blank rows = not tested)                 TREATMENT        DATE: 12/14/2023 Therex: Sidelying Lt shoulder ER c towel under arm 1 lb weight 2 x 20  Sidelying Lt shoulder abduction 2 x 20 1 lb  Sidelying Lt shoulder flexion 2 x 15   Additions to HEP with updated handout provided.   Neuro  Re-ed Supine Lt shoulder in 90 deg flexion with mild to moderate stabilization resistance from clinician 15 sec bouts Standing green band rows bilateral c scapular retraction focus 2 x 15 Standing green band GH ext bilateral 2 x 15    TherActivity UBE fwd/back 3 mins each way lvl 2.5 with 1 min rest between directions.  Performed to improve ROM, push/pull, reaching for daily activity.  Standing Lt shoudler flexion with UE ranger assist with Lt foot step in x 15 with focus on slow lowering    TREATMENT        DATE:  12/13/23 TherEx UBE 2.5 each way   UT stretches TherAct IR/ER reactive isometrics R band 2x10 Tband Rows G 3x10 Chest press 2# bar 2x10 Sidelying Lt ER 1# 2x10 Sidelying Abd 2x10 Manual: PROM and scap mobs  TREATMENT        DATE: 12/07/23 TherEx ROM measurements - see above for details  TherAct AA supine shoulder flexion 2# bar 2x10 with 3 sec hold at end range Lt chest press then overhead flexion 1# 2x10 Sidelying Lt shoulder abduction to 90 deg 2x10 Rows L3 band 2x10; 5 sec hold IR/ER reactive isometrics L1 band 2x10   TREATMENT        DATE: 11/25/23   TherEx Pendulums: flexion/extension, add/abd, circles both directions 20x each way Table slides: flexion, scaption x 15 holding  end range x 5 seconds Upper trap stretch x 3 bil holding 25 seconds Left wrist and elbow AROM  Neuro Re-Ed Scapular squeezes 2x 10 holding 10 sec Shoulder shrugs 2x10 Manual:  PROM left shoulder to pt's tolerance Modalities Vasopneumatic 34 deg low compression to left shoulder sitting x 10 minutes   PATIENT EDUCATION:  Education details: HEP Person educated: Patient Education method: Programmer, multimedia, Facilities manager, and Handouts Education comprehension: verbalized understanding, returned demonstration, and needs further education  HOME EXERCISE PROGRAM: Access Code: [email or web address] URL: [email or web address] Date: 12/14/2023 Prepared by: Ozell Silvan  Exercises -  Sidelying Shoulder External Rotation  - 1-2 x daily - 7 x weekly - 2-3 sets - 10-15 reps - Sidelying Shoulder Abduction Palm Forward (Mirrored)  - 1-2 x daily - 7 x weekly - 2-3 sets - 10-15 reps - Sidelying Shoulder Flexion 15 Degrees  - 1 x daily - 7 x weekly - 2-3 sets - 10-15 reps - Standing shoulder flexion wall slides  - 1-2 x daily - 7 x weekly - 1-2 sets - 10 reps - 5 hold - Standing Bilateral Low Shoulder Row with Anchored Resistance  - 1-2 x daily - 7 x weekly - 2-3 sets - 10-15 reps - Shoulder Extension with Resistance  - 1-2 x daily - 7 x weekly - 1-2 sets - 10-15 reps   ASSESSMENT:  CLINICAL IMPRESSION: Improvement in active range noted as well as updated flexion strength.  Adjusted HEP to match progression of in clinic activity with good tolerance noted within clinic time.  Continued skilled PT services indicated at this time.   OBJECTIVE IMPAIRMENTS: decreased coordination, decreased ROM, decreased strength, increased edema, increased fascial restrictions, increased muscle spasms, impaired UE functional use, and pain.   ACTIVITY LIMITATIONS: carrying, lifting, sleeping, bed mobility, bathing, toileting, dressing, reach over head, and hygiene/grooming  PARTICIPATION LIMITATIONS: meal prep, cleaning, laundry, driving, shopping, community activity, and occupation  PERSONAL FACTORS: Age, Past/current experiences, Time since onset of injury/illness/exacerbation, and 3+ comorbidities: Anemia, migraines, depression, HTN, recent sepsis hospitalization, Lt RTC repair 10/24 are also affecting patient's functional outcome.   REHAB POTENTIAL: Good  CLINICAL DECISION MAKING: Evolving/moderate complexity  EVALUATION COMPLEXITY: Moderate   GOALS: Goals reviewed with patient? Yes  SHORT TERM GOALS: Target date: 12/07/2023  Independent with initial HEP Goal status: ongoing 11/15/23  2.  Lt shoulder PROM improved 15 deg all motions for improved mobility Goal status: met  3.  Report  pain < 4/10 in Lt shoulder  Goal status: partially met   LONG TERM GOALS: Target date: 02/01/2024  Independent with final HEP Goal status: on going 12/14/2023  2.  PSFS score improved by 3 points Goal status: on going 12/14/2023  3.  Lt shoulder AROM improved to [facility] for improved function and strength Goal status: on going 12/14/2023  4.  Report pain < 2/10 with reaching and ADLs for improved function Goal status: on going 12/14/2023  5.  Demonstrate at least 4/5 Lt shoulder strength in order to safety return to work Goal status: on going 12/14/2023  6.  Perform work simulated activities without increase in pain to safely return to work Goal status: on going 12/14/2023     PLAN:  PT FREQUENCY: 1x/wk x 3 weeks, then 2x/wk x 9 weeks  PT DURATION: 12 weeks  PLANNED INTERVENTIONS: 97164- PT Re-evaluation, 97750- Physical Performance Testing, 97110-Therapeutic exercises, 97530- Therapeutic activity, [id number]- Neuromuscular re-education, 97535- Self Care, 02859- Manual therapy, [id number]- Aquatic Therapy, H9716- Electrical stimulation (unattended), 97016- Vasopneumatic device, [id number]- Ultrasound,  02966- Ionotophoresis 4mg /ml Dexamethasone , 20560 (1-2 muscles), 20561 (3+ muscles)- Dry Needling, Patient/Family education, Taping, Joint mobilization, Cryotherapy, and Moist heat.  PLAN FOR NEXT SESSION:  Continue functional reach improvements.    PRECAUTIONS: full ROM and strengthening allowed starting 11/29/23   NEXT MD VISIT: 12/19/23   Ozell Silvan, PT, DPT, OCS, ATC 12/14/23  9:42 AM

## 2023-12-15 ENCOUNTER — Inpatient Hospital Stay: Attending: Nurse Practitioner

## 2023-12-15 VITALS — BP 138/95 | HR 98 | Temp 99.3°F | Resp 18

## 2023-12-15 DIAGNOSIS — D509 Iron deficiency anemia, unspecified: Secondary | ICD-10-CM | POA: Diagnosis present

## 2023-12-15 MED ORDER — IRON SUCROSE 20 MG/ML IV SOLN
200.0000 mg | Freq: Once | INTRAVENOUS | Status: AC
  Administered 2023-12-15: 200 mg via INTRAVENOUS
  Filled 2023-12-15: qty 10

## 2023-12-19 ENCOUNTER — Ambulatory Visit (INDEPENDENT_AMBULATORY_CARE_PROVIDER_SITE_OTHER): Admitting: Surgical

## 2023-12-19 ENCOUNTER — Encounter: Payer: Self-pay | Admitting: Surgical

## 2023-12-19 DIAGNOSIS — M75122 Complete rotator cuff tear or rupture of left shoulder, not specified as traumatic: Secondary | ICD-10-CM

## 2023-12-19 NOTE — Progress Notes (Signed)
 Post-Op Visit Note   Patient: [name]           Date of Birth: [birth year]           MRN: [id number] Visit Date: 12/19/2023 PCP: Gretta Comer POUR, NP   Assessment & Plan:  Chief Complaint:  Chief Complaint  Patient presents with   Left Shoulder - Routine Post Op, Follow-up    10/18/2023 left shoulder arthroscopy, debridement, open revision rotator cuff repair   Visit Diagnoses:  1. Complete tear of left rotator cuff, unspecified whether traumatic     Plan: Patient is a 58 year old female who presents s/p left shoulder arthroscopy with debridement and mini open revision rotator cuff tear repair on 10/18/2023.  She feels quite a bit better.  Physical therapy is going well.  Pain is well-controlled.  She is taking Tylenol  and occasional Goody powder and muscle relaxer for pain control.  She feels she actually has more function and range of motion actively of her shoulder compared with after the initial recovery following her primary rotator cuff tear repair.  She is sleeping okay at night and she can lay on her left side for about 1 hour before she has to turn over.  She is still out of work.  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  There is some crepitus noted with passive motion of the shoulder primarily around the region of the incision.  Axillary nerve intact with deltoid firing.  Intact EPL, FPL, finger abduction, pronation/supination, bicep, tricep, deltoid.  2+ radial pulse of the operative extremity.  Intact rotator cuff strength of supraspinatus and subscapularis rated 5/5.  Intact infraspinatus strength which is about 95% of the strength of the contralateral extremity.  Plan at this time is continue out of work since she does a lot of repetitive active shoulder motion as her regular job.  Out  of work for about 4 months due to the revision nature of this procedure.  I would work until November 3 and follow-up in 5 weeks for clinical recheck with Dr. Addie prior to return to work.  Follow-Up Instructions: Return in about 5 weeks (around 01/23/2024), or With Dr. Addie.   Orders:  No orders of the defined types were placed in this encounter.  No orders of the defined types were placed in this encounter.   Imaging: No results found.  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) 11/27/2021   Vitamin D  deficiency 11/27/2021   Chronic fatigue 11/27/2021   Paranoid delusion (HCC) 11/27/2021   GERD (gastroesophageal reflux disease) 04/25/2019   Hyperlipidemia, mixed 03/02/2019   Benign essential HTN 02/18/2019   Recurrent cystitis 11/27/2012   Intractable migraine with status migrainosus 09/19/2011   Chronic constipation 06/25/2011  Past Medical History:  Diagnosis Date   Anemia    BV (bacterial vaginosis) 11/27/2012   Celiac disease    Cough due to ACE inhibitor 04/25/2019   Depression    Essential hypertension    Family history of adverse reaction to anesthesia    MOM-HARD TIME WAKING UP   GERD (gastroesophageal reflux disease)    Migraine with visual aura    MIGRAINES   UTI (lower urinary tract infection)     Family History  Problem Relation Age of Onset    Hypertension Mother    Stroke Mother    Cancer Father    Breast cancer Maternal Aunt    Hypertension Maternal Grandmother    Colon cancer Maternal Grandmother    Breast cancer Maternal Grandmother    Skin cancer Maternal Grandmother    Hypertension Maternal Grandfather    Hypertension Paternal Grandmother    Diabetes Paternal Grandmother    Hypertension Paternal Grandfather    Diabetes Son 12       type 1   Breast cancer Other     Past Surgical History:  Procedure Laterality Date   ABDOMINAL HYSTERECTOMY     BICEPT TENODESIS Left 01/27/2023   Procedure: BICEPS TENODESIS;  Surgeon: Addie Cordella Hamilton, MD;  Location: [facility] OR;  Service: Orthopedics;  Laterality: Left;   BLADDER SUSPENSION     COLONOSCOPY WITH PROPOFOL  N/A 05/11/2022   Procedure: COLONOSCOPY WITH PROPOFOL ;  Surgeon: Therisa Bi, MD;  Location: [facility] ENDOSCOPY;  Service: Gastroenterology;  Laterality: N/A;   ESOPHAGOGASTRODUODENOSCOPY (EGD) WITH PROPOFOL  N/A 05/11/2022   Procedure: ESOPHAGOGASTRODUODENOSCOPY (EGD) WITH PROPOFOL ;  Surgeon: Therisa Bi, MD;  Location: [facility] ENDOSCOPY;  Service: Gastroenterology;  Laterality: N/A;   EXPLORATORY LAPAROTOMY     IUD REMOVAL  07/25/2017   Procedure: INTRAUTERINE DEVICE (IUD) REMOVAL;  Surgeon: Janit Alm Agent, MD;  Location: [facility] ORS;  Service: Gynecology;;   LAPAROSCOPIC ASSISTED VAGINAL HYSTERECTOMY Bilateral 07/25/2017   Procedure: LAPAROSCOPIC ASSISTED VAGINAL HYSTERECTOMY WITH BILATERAL SALPING OOPHERECTOMY;  Surgeon: Janit Alm Agent, MD;  Location: [facility] ORS;  Service: Gynecology;  Laterality: Bilateral;   POSTERIOR LUMBAR FUSION 2 WITH HARDWARE REMOVAL Left 10/18/2023   Procedure: ARTHROSCOPY, SHOULDER WITH DEBRIDEMENT, LEFT;  Surgeon: Addie Cordella Hamilton, MD;  Location: [facility] OR;  Service: Orthopedics;  Laterality: Left;  left shoulder arthroscopy, revision rotator cuff tear repair, possible cuff mend   SHOULDER ARTHROSCOPY WITH OPEN ROTATOR CUFF REPAIR AND DISTAL CLAVICLE  ACROMINECTOMY Left 01/27/2023   Procedure: LEFT SHOULDER ARTHROSCOPY, DEBRIDEMENT, DISTAL CLAVICLE EXCISION, MINI OPEN ROTATOR CUFF TEAR REPAIR;  Surgeon: Addie Cordella Hamilton, MD;  Location: [facility] OR;  Service: Orthopedics;  Laterality: Left;   SHOULDER OPEN ROTATOR CUFF REPAIR Left 10/18/2023   Procedure: REPAIR, ROTATOR CUFF, OPEN;  Surgeon: Addie Cordella Hamilton, MD;  Location: [facility] OR;  Service: Orthopedics;  Laterality: Left;   TUBAL LIGATION     Social History   Occupational History   Occupation: Corporate treasurer  Tobacco Use   Smoking status: Never   Smokeless tobacco: Never  Vaping Use   Vaping status: Never Used  Substance and Sexual Activity   Alcohol use: No    Alcohol/week: 0.0 standard drinks of alcohol   Drug use: No   Sexual activity: Yes    Partners: Female    Birth control/protection: Surgical    Comment: INTERCOURSE AGE 78, SEXUAL PARTNERS LEES THAN 6

## 2023-12-20 ENCOUNTER — Inpatient Hospital Stay

## 2023-12-20 VITALS — BP 137/93 | HR 96 | Resp 18

## 2023-12-20 DIAGNOSIS — D509 Iron deficiency anemia, unspecified: Secondary | ICD-10-CM | POA: Diagnosis not present

## 2023-12-20 MED ORDER — IRON SUCROSE 20 MG/ML IV SOLN
200.0000 mg | Freq: Once | INTRAVENOUS | Status: AC
  Administered 2023-12-20: 200 mg via INTRAVENOUS
  Filled 2023-12-20: qty 10

## 2023-12-20 NOTE — Patient Instructions (Signed)

## 2023-12-26 ENCOUNTER — Telehealth: Payer: Self-pay | Admitting: Physical Therapy

## 2023-12-26 ENCOUNTER — Encounter: Admitting: Physical Therapy

## 2023-12-26 NOTE — Therapy (Incomplete)
 OUTPATIENT PHYSICAL THERAPY TREATMENT  Patient Name: Annayah Worthley MRN: [id number] DOB:[birth year], 58 y.o., female Today's Date: 12/26/2023  END OF SESSION:          Past Medical History:  Diagnosis Date   Anemia    BV (bacterial vaginosis) 11/27/2012   Celiac disease    Cough due to ACE inhibitor 04/25/2019   Depression    Essential hypertension    Family history of adverse reaction to anesthesia    MOM-HARD TIME WAKING UP   GERD (gastroesophageal reflux disease)    Migraine with visual aura    MIGRAINES   UTI (lower urinary tract infection)    Past Surgical History:  Procedure Laterality Date   ABDOMINAL HYSTERECTOMY     BICEPT TENODESIS Left 01/27/2023   Procedure: BICEPS TENODESIS;  Surgeon: Addie Cordella Hamilton, MD;  Location: [facility] OR;  Service: Orthopedics;  Laterality: Left;   BLADDER SUSPENSION     COLONOSCOPY WITH PROPOFOL  N/A 05/11/2022   Procedure: COLONOSCOPY WITH PROPOFOL ;  Surgeon: Therisa Bi, MD;  Location: [facility] ENDOSCOPY;  Service: Gastroenterology;  Laterality: N/A;   ESOPHAGOGASTRODUODENOSCOPY (EGD) WITH PROPOFOL  N/A 05/11/2022   Procedure: ESOPHAGOGASTRODUODENOSCOPY (EGD) WITH PROPOFOL ;  Surgeon: Therisa Bi, MD;  Location: [facility] ENDOSCOPY;  Service: Gastroenterology;  Laterality: N/A;   EXPLORATORY LAPAROTOMY     IUD REMOVAL  07/25/2017   Procedure: INTRAUTERINE DEVICE (IUD) REMOVAL;  Surgeon: Janit Alm Agent, MD;  Location: [facility] ORS;  Service: Gynecology;;   LAPAROSCOPIC ASSISTED VAGINAL HYSTERECTOMY Bilateral 07/25/2017   Procedure: LAPAROSCOPIC ASSISTED VAGINAL HYSTERECTOMY WITH BILATERAL SALPING OOPHERECTOMY;  Surgeon: Janit Alm Agent, MD;  Location: [facility] ORS;  Service: Gynecology;  Laterality: Bilateral;   POSTERIOR LUMBAR FUSION 2 WITH HARDWARE REMOVAL Left 10/18/2023   Procedure: ARTHROSCOPY, SHOULDER WITH DEBRIDEMENT, LEFT;  Surgeon: Addie Cordella Hamilton, MD;  Location: [facility] OR;  Service: Orthopedics;  Laterality: Left;  left shoulder arthroscopy,  revision rotator cuff tear repair, possible cuff mend   SHOULDER ARTHROSCOPY WITH OPEN ROTATOR CUFF REPAIR AND DISTAL CLAVICLE ACROMINECTOMY Left 01/27/2023   Procedure: LEFT SHOULDER ARTHROSCOPY, DEBRIDEMENT, DISTAL CLAVICLE EXCISION, MINI OPEN ROTATOR CUFF TEAR REPAIR;  Surgeon: Addie Cordella Hamilton, MD;  Location: [facility] OR;  Service: Orthopedics;  Laterality: Left;   SHOULDER OPEN ROTATOR CUFF REPAIR Left 10/18/2023   Procedure: REPAIR, ROTATOR CUFF, OPEN;  Surgeon: Addie Cordella Hamilton, MD;  Location: [facility] OR;  Service: Orthopedics;  Laterality: Left;   TUBAL LIGATION     Patient Active Problem List   Diagnosis Date Noted   Synovitis of left shoulder 11/06/2023   Acute cystitis with hematuria 08/04/2023   Sepsis secondary to UTI (HCC) 08/01/2023   Obesity (BMI 30-39.9) 08/01/2023   Vertigo 02/25/2023   Complete tear of left rotator cuff 02/08/2023   Biceps tendonitis, left 02/08/2023   Arthritis of left acromioclavicular joint 02/08/2023   Memory change 12/06/2022   Excessive daytime sleepiness 12/06/2022   Prediabetes 07/29/2022   Persistent cough for 3 weeks or longer 01/21/2022   Iron  deficiency anemia 11/30/2021   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) 11/27/2021   Vitamin D  deficiency 11/27/2021   Chronic fatigue 11/27/2021   Paranoid delusion (HCC) 11/27/2021   GERD (gastroesophageal reflux disease) 04/25/2019   Hyperlipidemia, mixed 03/02/2019   Benign essential HTN 02/18/2019   Recurrent cystitis 11/27/2012   Intractable migraine with status migrainosus 09/19/2011   Chronic constipation 06/25/2011    PCP: Gretta Comer POUR, NP  REFERRING PROVIDER: Addie Cordella Hamilton, MD  REFERRING DIAG: [phone] (ICD-10-CM) -  Complete tear of left rotator cuff, unspecified whether traumatic  Rationale for Evaluation and Treatment: Rehabilitation  THERAPY DIAG:  No diagnosis found.  ONSET DATE: dos: 10/18/23   SUBJECTIVE:                                                                                                                                                                                            SUBJECTIVE STATEMENT: *** Pt indicated nothing bad pain wise.  Feeling ok with progressions at this time.  Pt indicated better reaching recently.    PERTINENT HISTORY:  Pt is s/p Lt RTC revision on 10/18/23. She is no longer using her sling. Anemia, migraines, depression, HTN, recent sepsis hospitalization, Lt RTC repair 10/24  PAIN:  NPRS scale: up to 3/10 or so in last 24 hours.  Pain location: Lt shoulder Pain description: aching, throbbing Aggravating factors: accidentally moving arm Relieving factors: tylenol , oxy at night  PRECAUTIONS:  Shoulder and Other: PROM only until 11/30/23  RED FLAGS: None   WEIGHT BEARING RESTRICTIONS:  No  FALLS:  Has patient fallen in last 6 months? No  LIVING ENVIRONMENT: Lives with: lives with their spouse and lives with an adult companion Lives in: House/apartment  OCCUPATION:  Mudlogger  PLOF:  Independent and Leisure: spend time with grandchild  PATIENT GOALS:  Use arm without pain, regain function in arm   OBJECTIVE:  Note: Objective measures were completed at Evaluation unless otherwise noted.   PATIENT SURVEYS:  Patient-Specific Activity Scoring Scheme  0 represents "unable to perform." 10 represents "able to perform at prior level. 0 1 2 3 4 5 6 7 8 9  10 (Date and Score)   Activity Eval     1. Brushing hair 3     2. Working 0    3. UB dressing 4   4.Wash hair 3   Score 2.5    Total score = sum of the activity scores/number of activities Minimum detectable change (90%CI) for average score = 2 points Minimum detectable change (90%CI) for single activity score = 3 points    COGNITIVE STATUS: History of cognitive impairments - at baseline   SENSATION: WFL  POSTURE:  rounded shoulders and forward head  HAND DOMINANCE:   Right  GAIT: 11/09/23 Comments: independent   EDEMA: 11/09/23 Not formally measured but does appear to have some swelling in Lt shoulder and UE   UPPER EXTREMITY ROM:  ROM Right eval Left eval Left 11/15/23 Left 12/07/23 Left 12/14/2023  Shoulder flexion  P: 116 P: 115 A: 132 (supine) A: 114 (standing) 134 AROM in standing  Shoulder abduction  P: 86 P:  100 A: 90 (sidelying) A: 85 (standing)   Shoulder internal rotation  P: 60 in 45 deg abd (limited by abdomen)     Shoulder external rotation  P: 45 in 45 deg abduction  A: 60 (standing)    (Blank rows = not tested)   UPPER EXTREMITY MMT:  11/09/23: Deferred due to post-op status and precautions  MMT Right eval Left eval Left 12/14/2023  Shoulder flexion   4/5  Shoulder extension     Shoulder abduction     Shoulder adduction     Shoulder extension     Shoulder internal rotation     Shoulder external rotation     Middle trapezius     Lower trapezius     Elbow flexion     Elbow extension     Wrist flexion     Wrist extension     Wrist ulnar deviation     Wrist radial deviation     Wrist pronation     Wrist supination     Grip strength      (Blank rows = not tested)                 TREATMENT 12/26/23 ***    12/14/2023 Therex: Sidelying Lt shoulder ER c towel under arm 1 lb weight 2 x 20  Sidelying Lt shoulder abduction 2 x 20 1 lb  Sidelying Lt shoulder flexion 2 x 15   Additions to HEP with updated handout provided.   Neuro Re-ed Supine Lt shoulder in 90 deg flexion with mild to moderate stabilization resistance from clinician 15 sec bouts Standing green band rows bilateral c scapular retraction focus 2 x 15 Standing green band GH ext bilateral 2 x 15    TherActivity UBE fwd/back 3 mins each way lvl 2.5 with 1 min rest between directions.  Performed to improve ROM, push/pull, reaching for daily activity.  Standing Lt shoudler flexion with UE ranger assist with Lt foot step in x 15 with focus on slow  lowering    12/13/23 TherEx UBE 2.5 each way   UT stretches TherAct IR/ER reactive isometrics R band 2x10 Tband Rows G 3x10 Chest press 2# bar 2x10 Sidelying Lt ER 1# 2x10 Sidelying Abd 2x10 Manual: PROM and scap mobs  12/07/23 TherEx ROM measurements - see above for details  TherAct AA supine shoulder flexion 2# bar 2x10 with 3 sec hold at end range Lt chest press then overhead flexion 1# 2x10 Sidelying Lt shoulder abduction to 90 deg 2x10 Rows L3 band 2x10; 5 sec hold IR/ER reactive isometrics L1 band 2x10   11/25/23   TherEx Pendulums: flexion/extension, add/abd, circles both directions 20x each way Table slides: flexion, scaption x 15 holding end range x 5 seconds Upper trap stretch x 3 bil holding 25 seconds Left wrist and elbow AROM  Neuro Re-Ed Scapular squeezes 2x 10 holding 10 sec Shoulder shrugs 2x10 Manual:  PROM left shoulder to pt's tolerance Modalities Vasopneumatic 34 deg low compression to left shoulder sitting x 10 minutes   PATIENT EDUCATION:  Education details: HEP Person educated: Patient Education method: Programmer, multimedia, Facilities manager, and Handouts Education comprehension: verbalized understanding, returned demonstration, and needs further education  HOME EXERCISE PROGRAM: Access Code: [email or web address] URL: [email or web address] Date: 12/14/2023 Prepared by: Ozell Silvan  Exercises - Sidelying Shoulder External Rotation  - 1-2 x daily - 7 x weekly - 2-3 sets - 10-15 reps - Sidelying Shoulder Abduction Palm Forward (Mirrored)  - 1-2 x daily - 7 x weekly -  2-3 sets - 10-15 reps - Sidelying Shoulder Flexion 15 Degrees  - 1 x daily - 7 x weekly - 2-3 sets - 10-15 reps - Standing shoulder flexion wall slides  - 1-2 x daily - 7 x weekly - 1-2 sets - 10 reps - 5 hold - Standing Bilateral Low Shoulder Row with Anchored Resistance  - 1-2 x daily - 7 x weekly - 2-3 sets - 10-15 reps - Shoulder Extension with Resistance  - 1-2 x daily - 7 x  weekly - 1-2 sets - 10-15 reps   ASSESSMENT:  CLINICAL IMPRESSION: *** Improvement in active range noted as well as updated flexion strength.  Adjusted HEP to match progression of in clinic activity with good tolerance noted within clinic time.  Continued skilled PT services indicated at this time.   OBJECTIVE IMPAIRMENTS: decreased coordination, decreased ROM, decreased strength, increased edema, increased fascial restrictions, increased muscle spasms, impaired UE functional use, and pain.   ACTIVITY LIMITATIONS: carrying, lifting, sleeping, bed mobility, bathing, toileting, dressing, reach over head, and hygiene/grooming  PARTICIPATION LIMITATIONS: meal prep, cleaning, laundry, driving, shopping, community activity, and occupation  PERSONAL FACTORS: Age, Past/current experiences, Time since onset of injury/illness/exacerbation, and 3+ comorbidities: Anemia, migraines, depression, HTN, recent sepsis hospitalization, Lt RTC repair 10/24 are also affecting patient's functional outcome.   REHAB POTENTIAL: Good  CLINICAL DECISION MAKING: Evolving/moderate complexity  EVALUATION COMPLEXITY: Moderate   GOALS: Goals reviewed with patient? Yes  SHORT TERM GOALS: Target date: 12/07/2023  Independent with initial HEP Goal status: ongoing 11/15/23  2.  Lt shoulder PROM improved 15 deg all motions for improved mobility Goal status: met  3.  Report pain < 4/10 in Lt shoulder  Goal status: partially met   LONG TERM GOALS: Target date: 02/01/2024  Independent with final HEP Goal status: on going 12/14/2023  2.  PSFS score improved by 3 points Goal status: on going 12/14/2023  3.  Lt shoulder AROM improved to [facility] for improved function and strength Goal status: on going 12/14/2023  4.  Report pain < 2/10 with reaching and ADLs for improved function Goal status: on going 12/14/2023  5.  Demonstrate at least 4/5 Lt shoulder strength in order to safety return to work Goal status: on going  12/14/2023  6.  Perform work simulated activities without increase in pain to safely return to work Goal status: on going 12/14/2023     PLAN:  PT FREQUENCY: 1x/wk x 3 weeks, then 2x/wk x 9 weeks  PT DURATION: 12 weeks  PLANNED INTERVENTIONS: 97164- PT Re-evaluation, 97750- Physical Performance Testing, 97110-Therapeutic exercises, 97530- Therapeutic activity, 97112- Neuromuscular re-education, 97535- Self Care, 02859- Manual therapy, [phone]- Aquatic Therapy, H9716- Electrical stimulation (unattended), 97016- Vasopneumatic device, [id number]- Ultrasound, [id number]- Ionotophoresis 4mg /ml Dexamethasone , 79439 (1-2 muscles), 20561 (3+ muscles)- Dry Needling, Patient/Family education, Taping, Joint mobilization, Cryotherapy, and Moist heat.  PLAN FOR NEXT SESSION: *** Continue functional reach improvements.    PRECAUTIONS: full ROM and strengthening allowed starting 11/29/23   NEXT MD VISIT: 12/19/23 ***   Corean JULIANNA Ku, PT, DPT 12/26/23 7:23 AM

## 2023-12-26 NOTE — Telephone Encounter (Signed)
 LVM for pt as she did not show for her PT appt.  Last scheduled appt so requested she call back if she would like to schedule additional appts.  Reminded of no show policy as this is no show #2 for this episode of care.  Corean JULIANNA Ku, PT, DPT 12/26/23 9:56 AM

## 2023-12-27 ENCOUNTER — Inpatient Hospital Stay

## 2023-12-27 ENCOUNTER — Ambulatory Visit

## 2023-12-30 ENCOUNTER — Inpatient Hospital Stay

## 2023-12-30 VITALS — BP 129/90 | HR 73 | Temp 97.8°F | Resp 18

## 2023-12-30 DIAGNOSIS — D509 Iron deficiency anemia, unspecified: Secondary | ICD-10-CM | POA: Diagnosis not present

## 2023-12-30 MED ORDER — IRON SUCROSE 20 MG/ML IV SOLN
200.0000 mg | Freq: Once | INTRAVENOUS | Status: AC
  Administered 2023-12-30: 200 mg via INTRAVENOUS
  Filled 2023-12-30: qty 10

## 2023-12-30 NOTE — Patient Instructions (Signed)

## 2023-12-30 NOTE — Progress Notes (Signed)
 Patient tolerated Venofer  infusion well. Explained recommendation of 30 min post monitoring. Patient refused to wait post monitoring. Educated on what signs to watch for & to call with any concerns. No questions, discharged. Stable

## 2024-01-05 ENCOUNTER — Ambulatory Visit (INDEPENDENT_AMBULATORY_CARE_PROVIDER_SITE_OTHER): Admitting: Physical Therapy

## 2024-01-05 ENCOUNTER — Encounter: Payer: Self-pay | Admitting: Physical Therapy

## 2024-01-05 DIAGNOSIS — M25612 Stiffness of left shoulder, not elsewhere classified: Secondary | ICD-10-CM

## 2024-01-05 DIAGNOSIS — G8929 Other chronic pain: Secondary | ICD-10-CM

## 2024-01-05 DIAGNOSIS — R600 Localized edema: Secondary | ICD-10-CM

## 2024-01-05 DIAGNOSIS — M25512 Pain in left shoulder: Secondary | ICD-10-CM

## 2024-01-05 DIAGNOSIS — M6281 Muscle weakness (generalized): Secondary | ICD-10-CM | POA: Diagnosis not present

## 2024-01-05 DIAGNOSIS — R293 Abnormal posture: Secondary | ICD-10-CM

## 2024-01-05 NOTE — Therapy (Signed)
 OUTPATIENT PHYSICAL THERAPY TREATMENT  Patient Name: [name] MRN: [id number] DOB:1965-11-13, 58 y.o., female Today's Date: 01/05/2024  END OF SESSION:  PT End of Session - 01/05/24 1021     Visit Number 7    Number of Visits 21    Date for Recertification  02/01/24    Authorization Type [employer]    PT Start Time 1020    PT Stop Time 1100    PT Time Calculation (min) 40 min    Activity Tolerance Patient tolerated treatment well    Behavior During Therapy [facility] for tasks assessed/performed                 Past Medical History:  Diagnosis Date   Anemia    BV (bacterial vaginosis) 11/27/2012   Celiac disease    Cough due to ACE inhibitor 04/25/2019   Depression    Essential hypertension    Family history of adverse reaction to anesthesia    MOM-HARD TIME WAKING UP   GERD (gastroesophageal reflux disease)    Migraine with visual aura    MIGRAINES   UTI (lower urinary tract infection)    Past Surgical History:  Procedure Laterality Date   ABDOMINAL HYSTERECTOMY     BICEPT TENODESIS Left 01/27/2023   Procedure: BICEPS TENODESIS;  Surgeon: Addie Cordella Hamilton, MD;  Location: [facility] OR;  Service: Orthopedics;  Laterality: Left;   BLADDER SUSPENSION     COLONOSCOPY WITH PROPOFOL  N/A 05/11/2022   Procedure: COLONOSCOPY WITH PROPOFOL ;  Surgeon: Therisa Bi, MD;  Location: [facility] ENDOSCOPY;  Service: Gastroenterology;  Laterality: N/A;   ESOPHAGOGASTRODUODENOSCOPY (EGD) WITH PROPOFOL  N/A 05/11/2022   Procedure: ESOPHAGOGASTRODUODENOSCOPY (EGD) WITH PROPOFOL ;  Surgeon: Therisa Bi, MD;  Location: [facility] ENDOSCOPY;  Service: Gastroenterology;  Laterality: N/A;   EXPLORATORY LAPAROTOMY     IUD REMOVAL  07/25/2017   Procedure: INTRAUTERINE DEVICE (IUD) REMOVAL;  Surgeon: Janit Alm Agent, MD;  Location: [facility] ORS;  Service: Gynecology;;   LAPAROSCOPIC ASSISTED VAGINAL HYSTERECTOMY Bilateral 07/25/2017   Procedure: LAPAROSCOPIC ASSISTED VAGINAL HYSTERECTOMY WITH BILATERAL SALPING  OOPHERECTOMY;  Surgeon: Janit Alm Agent, MD;  Location: [facility] ORS;  Service: Gynecology;  Laterality: Bilateral;   POSTERIOR LUMBAR FUSION 2 WITH HARDWARE REMOVAL Left 10/18/2023   Procedure: ARTHROSCOPY, SHOULDER WITH DEBRIDEMENT, LEFT;  Surgeon: Addie Cordella Hamilton, MD;  Location: [facility] OR;  Service: Orthopedics;  Laterality: Left;  left shoulder arthroscopy, revision rotator cuff tear repair, possible cuff mend   SHOULDER ARTHROSCOPY WITH OPEN ROTATOR CUFF REPAIR AND DISTAL CLAVICLE ACROMINECTOMY Left 01/27/2023   Procedure: LEFT SHOULDER ARTHROSCOPY, DEBRIDEMENT, DISTAL CLAVICLE EXCISION, MINI OPEN ROTATOR CUFF TEAR REPAIR;  Surgeon: Addie Cordella Hamilton, MD;  Location: [facility] OR;  Service: Orthopedics;  Laterality: Left;   SHOULDER OPEN ROTATOR CUFF REPAIR Left 10/18/2023   Procedure: REPAIR, ROTATOR CUFF, OPEN;  Surgeon: Addie Cordella Hamilton, MD;  Location: [facility] OR;  Service: Orthopedics;  Laterality: Left;   TUBAL LIGATION     Patient Active Problem List   Diagnosis Date Noted   Synovitis of left shoulder 11/06/2023   Acute cystitis with hematuria 08/04/2023   Sepsis secondary to UTI (HCC) 08/01/2023   Obesity (BMI 30-39.9) 08/01/2023   Vertigo 02/25/2023   Complete tear of left rotator cuff 02/08/2023   Biceps tendonitis, left 02/08/2023   Arthritis of left acromioclavicular joint 02/08/2023   Memory change 12/06/2022   Excessive daytime sleepiness 12/06/2022   Prediabetes 07/29/2022   Persistent cough for 3 weeks or longer 01/21/2022   Iron  deficiency anemia 11/30/2021  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) 11/27/2021   Vitamin D  deficiency 11/27/2021   Chronic fatigue 11/27/2021   Paranoid delusion (HCC) 11/27/2021   GERD (gastroesophageal reflux disease) 04/25/2019   Hyperlipidemia, mixed 03/02/2019   Benign essential HTN 02/18/2019   Recurrent cystitis 11/27/2012   Intractable migraine with status migrainosus 09/19/2011   Chronic  constipation 06/25/2011    PCP: Gretta Comer POUR, NP  REFERRING PROVIDER: Addie Cordella Hamilton, MD  REFERRING DIAG: [phone] (ICD-10-CM) - Complete tear of left rotator cuff, unspecified whether traumatic  Rationale for Evaluation and Treatment: Rehabilitation  THERAPY DIAG:  Localized edema  Stiffness of left shoulder, not elsewhere classified  Muscle weakness (generalized)  Chronic left shoulder pain  Abnormal posture  ONSET DATE: dos: 10/18/23   SUBJECTIVE:                                                                                                                                                                                           SUBJECTIVE STATEMENT: Doing pretty well; shoulder is still sore but not pain isn't bad.   PERTINENT HISTORY:  Pt is s/p Lt RTC revision on 10/18/23. She is no longer using her sling. Anemia, migraines, depression, HTN, recent sepsis hospitalization, Lt RTC repair 10/24  PAIN:  NPRS scale: up to 3/10 or so in last 24 hours.  Pain location: Lt shoulder Pain description: aching, throbbing Aggravating factors: accidentally moving arm Relieving factors: tylenol , oxy at night  PRECAUTIONS:  Shoulder and Other: PROM only until 11/30/23  RED FLAGS: None   WEIGHT BEARING RESTRICTIONS:  No  FALLS:  Has patient fallen in last 6 months? No  LIVING ENVIRONMENT: Lives with: lives with their spouse and lives with an adult companion Lives in: House/apartment  OCCUPATION:  Mudlogger  PLOF:  Independent and Leisure: spend time with grandchild  PATIENT GOALS:  Use arm without pain, regain function in arm   OBJECTIVE:  Note: Objective measures were completed at Evaluation unless otherwise noted.   PATIENT SURVEYS:  Patient-Specific Activity Scoring Scheme  0 represents "unable to perform." 10 represents "able to perform at prior level. 0 1 2 3 4 5 6 7 8 9  10 (Date and Score)   Activity Eval     1. Brushing  hair 3     2. Working 0    3. UB dressing 4   4.Wash hair 3   Score 2.5    Total score = sum of the activity scores/number of activities Minimum detectable change (90%CI) for average score = 2 points Minimum detectable change (90%CI) for single activity score = 3 points  COGNITIVE STATUS: History of cognitive impairments - at baseline   SENSATION: WFL  POSTURE:  rounded shoulders and forward head  HAND DOMINANCE:  Right  GAIT: 11/09/23 Comments: independent   EDEMA: 11/09/23 Not formally measured but does appear to have some swelling in Lt shoulder and UE   UPPER EXTREMITY ROM:  ROM Right eval Left eval Left 11/15/23 Left 12/07/23 Left 12/14/2023 Left 01/05/24  Shoulder flexion  P: 116 P: 115 A: 132 (supine) A: 114 (standing) 134 AROM in standing A: 132  (Sitting)  Shoulder abduction  P: 86 P: 100 A: 90 (sidelying) A: 85 (standing)  A: 108  (sitting)  Shoulder internal rotation  P: 60 in 45 deg abd (limited by abdomen)      Shoulder external rotation  P: 45 in 45 deg abduction  A: 60 (standing)     (Blank rows = not tested)   UPPER EXTREMITY MMT:  11/09/23: Deferred due to post-op status and precautions  MMT Right eval Left eval Left 12/14/2023  Shoulder flexion   4/5  Shoulder extension     Shoulder abduction     Shoulder adduction     Shoulder extension     Shoulder internal rotation     Shoulder external rotation     Middle trapezius     Lower trapezius     Elbow flexion     Elbow extension     Wrist flexion     Wrist extension     Wrist ulnar deviation     Wrist radial deviation     Wrist pronation     Wrist supination     Grip strength      (Blank rows = not tested)                 TREATMENT 01/05/24 TherAct Pulleys flexion and abduction x3 min each Wall ladder flexion and abduction 1.5# wrist weight 2x10 each on Lt Rows L3 band 5 sec hold; 3x10 Shoulder ext L3 band 3x10 sec ER Lt L3 band 3x10 IR L3 band Lt 3x10 Lt shoulder  flexion 2# 3x10 to 90 deg Standing Lt shoulder abduction 1# 3x10 to 90 deg Wall plank with Lt shoulder taps 3x10 Lt arm ball on wall circles CW/CCW x 20 each    12/14/2023 Therex: Sidelying Lt shoulder ER c towel under arm 1 lb weight 2 x 20  Sidelying Lt shoulder abduction 2 x 20 1 lb  Sidelying Lt shoulder flexion 2 x 15   Additions to HEP with updated handout provided.   Neuro Re-ed Supine Lt shoulder in 90 deg flexion with mild to moderate stabilization resistance from clinician 15 sec bouts Standing green band rows bilateral c scapular retraction focus 2 x 15 Standing green band GH ext bilateral 2 x 15    TherActivity UBE fwd/back 3 mins each way lvl 2.5 with 1 min rest between directions.  Performed to improve ROM, push/pull, reaching for daily activity.  Standing Lt shoudler flexion with UE ranger assist with Lt foot step in x 15 with focus on slow lowering    12/13/23 TherEx UBE 2.5 each way   UT stretches TherAct IR/ER reactive isometrics R band 2x10 Tband Rows G 3x10 Chest press 2# bar 2x10 Sidelying Lt ER 1# 2x10 Sidelying Abd 2x10 Manual: PROM and scap mobs   PATIENT EDUCATION:  Education details: HEP Person educated: Patient Education method: Programmer, multimedia, Facilities manager, and Handouts Education comprehension: verbalized understanding, returned demonstration, and needs further education  HOME EXERCISE PROGRAM:  Access Code: [email or web address] URL: [email or web address] Date: 12/14/2023 Prepared by: Ozell Silvan  Exercises - Sidelying Shoulder External Rotation  - 1-2 x daily - 7 x weekly - 2-3 sets - 10-15 reps - Sidelying Shoulder Abduction Palm Forward (Mirrored)  - 1-2 x daily - 7 x weekly - 2-3 sets - 10-15 reps - Sidelying Shoulder Flexion 15 Degrees  - 1 x daily - 7 x weekly - 2-3 sets - 10-15 reps - Standing shoulder flexion wall slides  - 1-2 x daily - 7 x weekly - 1-2 sets - 10 reps - 5 hold - Standing Bilateral Low Shoulder Row with  Anchored Resistance  - 1-2 x daily - 7 x weekly - 2-3 sets - 10-15 reps - Shoulder Extension with Resistance  - 1-2 x daily - 7 x weekly - 1-2 sets - 10-15 reps   ASSESSMENT:  CLINICAL IMPRESSION: Pt tolerated session well today and able to tolerate strength progressions.  Will continue to benefit from PT to maximize function.   OBJECTIVE IMPAIRMENTS: decreased coordination, decreased ROM, decreased strength, increased edema, increased fascial restrictions, increased muscle spasms, impaired UE functional use, and pain.   ACTIVITY LIMITATIONS: carrying, lifting, sleeping, bed mobility, bathing, toileting, dressing, reach over head, and hygiene/grooming  PARTICIPATION LIMITATIONS: meal prep, cleaning, laundry, driving, shopping, community activity, and occupation  PERSONAL FACTORS: Age, Past/current experiences, Time since onset of injury/illness/exacerbation, and 3+ comorbidities: Anemia, migraines, depression, HTN, recent sepsis hospitalization, Lt RTC repair 10/24 are also affecting patient's functional outcome.   REHAB POTENTIAL: Good  CLINICAL DECISION MAKING: Evolving/moderate complexity  EVALUATION COMPLEXITY: Moderate   GOALS: Goals reviewed with patient? Yes  SHORT TERM GOALS: Target date: 12/07/2023  Independent with initial HEP Goal status: ongoing 11/15/23  2.  Lt shoulder PROM improved 15 deg all motions for improved mobility Goal status: met  3.  Report pain < 4/10 in Lt shoulder  Goal status: partially met   LONG TERM GOALS: Target date: 02/01/2024  Independent with final HEP Goal status: on going 12/14/2023  2.  PSFS score improved by 3 points Goal status: on going 12/14/2023  3.  Lt shoulder AROM improved to [facility] for improved function and strength Goal status: on going 12/14/2023  4.  Report pain < 2/10 with reaching and ADLs for improved function Goal status: on going 12/14/2023  5.  Demonstrate at least 4/5 Lt shoulder strength in order to safety return to  work Goal status: on going 12/14/2023  6.  Perform work simulated activities without increase in pain to safely return to work Goal status: on going 12/14/2023     PLAN:  PT FREQUENCY: 1x/wk x 3 weeks, then 2x/wk x 9 weeks  PT DURATION: 12 weeks  PLANNED INTERVENTIONS: 97164- PT Re-evaluation, 97750- Physical Performance Testing, 97110-Therapeutic exercises, 97530- Therapeutic activity, [id number]- Neuromuscular re-education, 97535- Self Care, [id number]- Manual therapy, [id number]- Aquatic Therapy, [id number]- Electrical stimulation (unattended), 97016- Vasopneumatic device, [id number]- Ultrasound, [id number]- Ionotophoresis 4mg /ml Dexamethasone , 79439 (1-2 muscles), 20561 (3+ muscles)- Dry Needling, Patient/Family education, Taping, Joint mobilization, Cryotherapy, and Moist heat.  PLAN FOR NEXT SESSION: update HEP,  Continue functional reach improvements.    PRECAUTIONS: full ROM and strengthening allowed starting 11/29/23   NEXT MD VISIT: 01/23/24    Corean JULIANNA Ku, PT, DPT 01/05/24 11:07 AM

## 2024-01-12 NOTE — Therapy (Signed)
 OUTPATIENT PHYSICAL THERAPY TREATMENT  Patient Name: [name] MRN: [id number] DOB:1966/03/02, 58 y.o., female Today's Date: 01/13/2024  END OF SESSION:  PT End of Session - 01/13/24 0929     Visit Number 8    Number of Visits 21    Date for Recertification  02/01/24    Authorization Type [employer]    PT Start Time 0930    PT Stop Time 1010    PT Time Calculation (min) 40 min    Activity Tolerance Patient tolerated treatment well    Behavior During Therapy [facility] for tasks assessed/performed                  Past Medical History:  Diagnosis Date   Anemia    BV (bacterial vaginosis) 11/27/2012   Celiac disease    Cough due to ACE inhibitor 04/25/2019   Depression    Essential hypertension    Family history of adverse reaction to anesthesia    MOM-HARD TIME WAKING UP   GERD (gastroesophageal reflux disease)    Migraine with visual aura    MIGRAINES   UTI (lower urinary tract infection)    Past Surgical History:  Procedure Laterality Date   ABDOMINAL HYSTERECTOMY     BICEPT TENODESIS Left 01/27/2023   Procedure: BICEPS TENODESIS;  Surgeon: Addie Cordella Hamilton, MD;  Location: [facility] OR;  Service: Orthopedics;  Laterality: Left;   BLADDER SUSPENSION     COLONOSCOPY WITH PROPOFOL  N/A 05/11/2022   Procedure: COLONOSCOPY WITH PROPOFOL ;  Surgeon: Therisa Bi, MD;  Location: [facility] ENDOSCOPY;  Service: Gastroenterology;  Laterality: N/A;   ESOPHAGOGASTRODUODENOSCOPY (EGD) WITH PROPOFOL  N/A 05/11/2022   Procedure: ESOPHAGOGASTRODUODENOSCOPY (EGD) WITH PROPOFOL ;  Surgeon: Therisa Bi, MD;  Location: [facility] ENDOSCOPY;  Service: Gastroenterology;  Laterality: N/A;   EXPLORATORY LAPAROTOMY     IUD REMOVAL  07/25/2017   Procedure: INTRAUTERINE DEVICE (IUD) REMOVAL;  Surgeon: Janit Alm Agent, MD;  Location: [facility] ORS;  Service: Gynecology;;   LAPAROSCOPIC ASSISTED VAGINAL HYSTERECTOMY Bilateral 07/25/2017   Procedure: LAPAROSCOPIC ASSISTED VAGINAL HYSTERECTOMY WITH BILATERAL SALPING  OOPHERECTOMY;  Surgeon: Janit Alm Agent, MD;  Location: [facility] ORS;  Service: Gynecology;  Laterality: Bilateral;   POSTERIOR LUMBAR FUSION 2 WITH HARDWARE REMOVAL Left 10/18/2023   Procedure: ARTHROSCOPY, SHOULDER WITH DEBRIDEMENT, LEFT;  Surgeon: Addie Cordella Hamilton, MD;  Location: [facility] OR;  Service: Orthopedics;  Laterality: Left;  left shoulder arthroscopy, revision rotator cuff tear repair, possible cuff mend   SHOULDER ARTHROSCOPY WITH OPEN ROTATOR CUFF REPAIR AND DISTAL CLAVICLE ACROMINECTOMY Left 01/27/2023   Procedure: LEFT SHOULDER ARTHROSCOPY, DEBRIDEMENT, DISTAL CLAVICLE EXCISION, MINI OPEN ROTATOR CUFF TEAR REPAIR;  Surgeon: Addie Cordella Hamilton, MD;  Location: [facility] OR;  Service: Orthopedics;  Laterality: Left;   SHOULDER OPEN ROTATOR CUFF REPAIR Left 10/18/2023   Procedure: REPAIR, ROTATOR CUFF, OPEN;  Surgeon: Addie Cordella Hamilton, MD;  Location: [facility] OR;  Service: Orthopedics;  Laterality: Left;   TUBAL LIGATION     Patient Active Problem List   Diagnosis Date Noted   Synovitis of left shoulder 11/06/2023   Acute cystitis with hematuria 08/04/2023   Sepsis secondary to UTI (HCC) 08/01/2023   Obesity (BMI 30-39.9) 08/01/2023   Vertigo 02/25/2023   Complete tear of left rotator cuff 02/08/2023   Biceps tendonitis, left 02/08/2023   Arthritis of left acromioclavicular joint 02/08/2023   Memory change 12/06/2022   Excessive daytime sleepiness 12/06/2022   Prediabetes 07/29/2022   Persistent cough for 3 weeks or longer 01/21/2022   Iron  deficiency anemia 11/30/2021  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) 11/27/2021   Vitamin D  deficiency 11/27/2021   Chronic fatigue 11/27/2021   Paranoid delusion (HCC) 11/27/2021   GERD (gastroesophageal reflux disease) 04/25/2019   Hyperlipidemia, mixed 03/02/2019   Benign essential HTN 02/18/2019   Recurrent cystitis 11/27/2012   Intractable migraine with status migrainosus 09/19/2011   Chronic  constipation 06/25/2011    PCP: Gretta Comer POUR, NP  REFERRING PROVIDER: Addie Cordella Hamilton, MD  REFERRING DIAG: [phone] (ICD-10-CM) - Complete tear of left rotator cuff, unspecified whether traumatic  Rationale for Evaluation and Treatment: Rehabilitation  THERAPY DIAG:  Localized edema  Stiffness of left shoulder, not elsewhere classified  Muscle weakness (generalized)  Chronic left shoulder pain  Abnormal posture  ONSET DATE: dos: 10/18/23   SUBJECTIVE:                                                                                                                                                                                           SUBJECTIVE STATEMENT: Shoulder is feeling better; not having a lot of pain   PERTINENT HISTORY:  Pt is s/p Lt RTC revision on 10/18/23. She is no longer using her sling. Anemia, migraines, depression, HTN, recent sepsis hospitalization, Lt RTC repair 10/24  PAIN:  NPRS scale: up to 5/10; most of the time 2-3/10 Pain location: Lt shoulder Pain description: aching, throbbing Aggravating factors: accidentally moving arm Relieving factors: tylenol , oxy at night  PRECAUTIONS:  Shoulder and Other: PROM only until 11/30/23  RED FLAGS: None   WEIGHT BEARING RESTRICTIONS:  No  FALLS:  Has patient fallen in last 6 months? No  LIVING ENVIRONMENT: Lives with: lives with their spouse and lives with an adult companion Lives in: House/apartment  OCCUPATION:  Mudlogger  PLOF:  Independent and Leisure: spend time with grandchild  PATIENT GOALS:  Use arm without pain, regain function in arm   OBJECTIVE:  Note: Objective measures were completed at Evaluation unless otherwise noted.   PATIENT SURVEYS:  Patient-Specific Activity Scoring Scheme  0 represents "unable to perform." 10 represents "able to perform at prior level. 0 1 2 3 4 5 6 7 8 9  10 (Date and Score)   Activity Eval  01/13/24   1. Brushing hair 3    6  2. Working 0  0  3. UB dressing 4 6  4.Wash hair 3 6  Score 2.5 4.5   Total score = sum of the activity scores/number of activities Minimum detectable change (90%CI) for average score = 2 points Minimum detectable change (90%CI) for single activity score = 3 points    COGNITIVE  STATUS: History of cognitive impairments - at baseline   SENSATION: WFL  POSTURE:  rounded shoulders and forward head  HAND DOMINANCE:  Right  GAIT: 11/09/23 Comments: independent   EDEMA: 11/09/23 Not formally measured but does appear to have some swelling in Lt shoulder and UE   UPPER EXTREMITY ROM:  ROM Right eval Left eval Left 11/15/23 Left 12/07/23 Left 12/14/2023 Left 01/05/24  Shoulder flexion  P: 116 P: 115 A: 132 (supine) A: 114 (standing) 134 AROM in standing A: 132  (Sitting)  Shoulder abduction  P: 86 P: 100 A: 90 (sidelying) A: 85 (standing)  A: 108  (sitting)  Shoulder internal rotation  P: 60 in 45 deg abd (limited by abdomen)      Shoulder external rotation  P: 45 in 45 deg abduction  A: 60 (standing)     (Blank rows = not tested)   UPPER EXTREMITY MMT:  11/09/23: Deferred due to post-op status and precautions  MMT Right eval Left eval Left 12/14/2023  Shoulder flexion   4/5  Shoulder extension     Shoulder abduction     Shoulder adduction     Shoulder extension     Shoulder internal rotation     Shoulder external rotation     Middle trapezius     Lower trapezius     Elbow flexion     Elbow extension     Wrist flexion     Wrist extension     Wrist ulnar deviation     Wrist radial deviation     Wrist pronation     Wrist supination     Grip strength      (Blank rows = not tested)                 TREATMENT 01/13/24 TherAct Pulleys flexion and abduction x3 min each Rows L4 band 3x10; 5 sec hold Shoulder ext L4 band 3x10; 5 sec hold ER Lt L3 band 2x15 IR L3 band Lt 2x15 Lt shoulder flexion 2# 3x10 to 90 deg Standing Lt shoulder abduction 2# 3x10 to  90 deg Supine shoulder flexion with 2# to end range with 10 sec hold for motion x10 reps Sidelying shoulder abduction with 1# to end range with 10 sec hold for motion x10 reps Sidelying ER on Lt 3x10; 2#   01/05/24 TherAct Pulleys flexion and abduction x3 min each Wall ladder flexion and abduction 1.5# wrist weight 2x10 each on Lt Rows L3 band 5 sec hold; 3x10 Shoulder ext L3 band 3x10 sec ER Lt L3 band 3x10 IR L3 band Lt 3x10 Lt shoulder flexion 2# 3x10 to 90 deg Standing Lt shoulder abduction 1# 3x10 to 90 deg Wall plank with Lt shoulder taps 3x10 Lt arm ball on wall circles CW/CCW x 20 each    12/14/2023 Therex: Sidelying Lt shoulder ER c towel under arm 1 lb weight 2 x 20  Sidelying Lt shoulder abduction 2 x 20 1 lb  Sidelying Lt shoulder flexion 2 x 15   Additions to HEP with updated handout provided.   Neuro Re-ed Supine Lt shoulder in 90 deg flexion with mild to moderate stabilization resistance from clinician 15 sec bouts Standing green band rows bilateral c scapular retraction focus 2 x 15 Standing green band GH ext bilateral 2 x 15    TherActivity UBE fwd/back 3 mins each way lvl 2.5 with 1 min rest between directions.  Performed to improve ROM, push/pull, reaching for daily activity.  Standing Lt shoudler  flexion with UE ranger assist with Lt foot step in x 15 with focus on slow lowering    12/13/23 TherEx UBE 2.5 each way   UT stretches TherAct IR/ER reactive isometrics R band 2x10 Tband Rows G 3x10 Chest press 2# bar 2x10 Sidelying Lt ER 1# 2x10 Sidelying Abd 2x10 Manual: PROM and scap mobs   PATIENT EDUCATION:  Education details: HEP Person educated: Patient Education method: Programmer, multimedia, Facilities manager, and Handouts Education comprehension: verbalized understanding, returned demonstration, and needs further education  HOME EXERCISE PROGRAM: Access Code: [email or web address] URL: [email or web address] Date: 12/14/2023 Prepared by: Ozell Silvan  Exercises - Sidelying Shoulder External Rotation  - 1-2 x daily - 7 x weekly - 2-3 sets - 10-15 reps - Sidelying Shoulder Abduction Palm Forward (Mirrored)  - 1-2 x daily - 7 x weekly - 2-3 sets - 10-15 reps - Sidelying Shoulder Flexion 15 Degrees  - 1 x daily - 7 x weekly - 2-3 sets - 10-15 reps - Standing shoulder flexion wall slides  - 1-2 x daily - 7 x weekly - 1-2 sets - 10 reps - 5 hold - Standing Bilateral Low Shoulder Row with Anchored Resistance  - 1-2 x daily - 7 x weekly - 2-3 sets - 10-15 reps - Shoulder Extension with Resistance  - 1-2 x daily - 7 x weekly - 1-2 sets - 10-15 reps   ASSESSMENT:  CLINICAL IMPRESSION: Pt tolerated session well today and will continue to benefit from PT to maximize strength.  Still having trouble with work simulated activities.  Continue skilled PT.    OBJECTIVE IMPAIRMENTS: decreased coordination, decreased ROM, decreased strength, increased edema, increased fascial restrictions, increased muscle spasms, impaired UE functional use, and pain.   ACTIVITY LIMITATIONS: carrying, lifting, sleeping, bed mobility, bathing, toileting, dressing, reach over head, and hygiene/grooming  PARTICIPATION LIMITATIONS: meal prep, cleaning, laundry, driving, shopping, community activity, and occupation  PERSONAL FACTORS: Age, Past/current experiences, Time since onset of injury/illness/exacerbation, and 3+ comorbidities: Anemia, migraines, depression, HTN, recent sepsis hospitalization, Lt RTC repair 10/24 are also affecting patient's functional outcome.   REHAB POTENTIAL: Good  CLINICAL DECISION MAKING: Evolving/moderate complexity  EVALUATION COMPLEXITY: Moderate   GOALS: Goals reviewed with patient? Yes  SHORT TERM GOALS: Target date: 12/07/2023  Independent with initial HEP Goal status: MET 01/13/24  2.  Lt shoulder PROM improved 15 deg all motions for improved mobility Goal status: met  3.  Report pain < 4/10 in Lt shoulder  Goal  status: MET 01/13/24   LONG TERM GOALS: Target date: 02/01/2024  Independent with final HEP Goal status: on going 12/14/2023  2.  PSFS score improved by 3 points Goal status: on going 01/13/24  3.  Lt shoulder AROM improved to [facility] for improved function and strength Goal status: on going 12/14/2023  4.  Report pain < 2/10 with reaching and ADLs for improved function Goal status: on going 12/14/2023  5.  Demonstrate at least 4/5 Lt shoulder strength in order to safety return to work Goal status: on going 12/14/2023  6.  Perform work simulated activities without increase in pain to safely return to work Goal status: on going 12/14/2023     PLAN:  PT FREQUENCY: 1x/wk x 3 weeks, then 2x/wk x 9 weeks  PT DURATION: 12 weeks  PLANNED INTERVENTIONS: 97164- PT Re-evaluation, 97750- Physical Performance Testing, 97110-Therapeutic exercises, 97530- Therapeutic activity, [id number]- Neuromuscular re-education, 97535- Self Care, [id number]- Manual therapy, [id number]- Aquatic Therapy, H9716- Electrical stimulation (unattended), 97016- Vasopneumatic device, [id number]- Ultrasound, [id number]- Ionotophoresis 4mg /ml  Dexamethasone , 20560 (1-2 muscles), 20561 (3+ muscles)- Dry Needling, Patient/Family education, Taping, Joint mobilization, Cryotherapy, and Moist heat.  PLAN FOR NEXT SESSION: work simulated exercises,  update HEP,  Continue functional reach improvements.    PRECAUTIONS: full ROM and strengthening allowed starting 11/29/23   NEXT MD VISIT: 01/23/24    Corean JULIANNA Ku, PT, DPT 01/13/24 10:13 AM

## 2024-01-13 ENCOUNTER — Encounter: Payer: Self-pay | Admitting: Physical Therapy

## 2024-01-13 ENCOUNTER — Ambulatory Visit (INDEPENDENT_AMBULATORY_CARE_PROVIDER_SITE_OTHER): Admitting: Physical Therapy

## 2024-01-13 DIAGNOSIS — M6281 Muscle weakness (generalized): Secondary | ICD-10-CM | POA: Diagnosis not present

## 2024-01-13 DIAGNOSIS — M25512 Pain in left shoulder: Secondary | ICD-10-CM

## 2024-01-13 DIAGNOSIS — M25612 Stiffness of left shoulder, not elsewhere classified: Secondary | ICD-10-CM

## 2024-01-13 DIAGNOSIS — R600 Localized edema: Secondary | ICD-10-CM | POA: Diagnosis not present

## 2024-01-13 DIAGNOSIS — G8929 Other chronic pain: Secondary | ICD-10-CM

## 2024-01-13 DIAGNOSIS — R293 Abnormal posture: Secondary | ICD-10-CM

## 2024-01-17 ENCOUNTER — Ambulatory Visit: Admitting: Physical Therapy

## 2024-01-17 ENCOUNTER — Encounter: Payer: Self-pay | Admitting: Physical Therapy

## 2024-01-17 DIAGNOSIS — M25512 Pain in left shoulder: Secondary | ICD-10-CM | POA: Diagnosis not present

## 2024-01-17 DIAGNOSIS — M6281 Muscle weakness (generalized): Secondary | ICD-10-CM

## 2024-01-17 DIAGNOSIS — M25612 Stiffness of left shoulder, not elsewhere classified: Secondary | ICD-10-CM | POA: Diagnosis not present

## 2024-01-17 DIAGNOSIS — R600 Localized edema: Secondary | ICD-10-CM

## 2024-01-17 DIAGNOSIS — R293 Abnormal posture: Secondary | ICD-10-CM

## 2024-01-17 DIAGNOSIS — G8929 Other chronic pain: Secondary | ICD-10-CM

## 2024-01-17 NOTE — Therapy (Signed)
 OUTPATIENT PHYSICAL THERAPY TREATMENT  Patient Name: Ronnett Pullin MRN: [id number] DOB:1965/06/29, 59 y.o., female Today's Date: 01/17/2024  END OF SESSION:  PT End of Session - 01/17/24 0844     Visit Number 9    Number of Visits 21    Date for Recertification  02/01/24    Authorization Type U[employer]    PT Start Time 0845    PT Stop Time 0925    PT Time Calculation (min) 40 min    Activity Tolerance Patient tolerated treatment well    Behavior During Therapy [facility] for tasks assessed/performed                   Past Medical History:  Diagnosis Date   Anemia    BV (bacterial vaginosis) 11/27/2012   Celiac disease    Cough due to ACE inhibitor 04/25/2019   Depression    Essential hypertension    Family history of adverse reaction to anesthesia    MOM-HARD TIME WAKING UP   GERD (gastroesophageal reflux disease)    Migraine with visual aura    MIGRAINES   UTI (lower urinary tract infection)    Past Surgical History:  Procedure Laterality Date   ABDOMINAL HYSTERECTOMY     BICEPT TENODESIS Left 01/27/2023   Procedure: BICEPS TENODESIS;  Surgeon: Addie Cordella Hamilton, MD;  Location: [facility] OR;  Service: Orthopedics;  Laterality: Left;   BLADDER SUSPENSION     COLONOSCOPY WITH PROPOFOL  N/A 05/11/2022   Procedure: COLONOSCOPY WITH PROPOFOL ;  Surgeon: Therisa Bi, MD;  Location: [facility] ENDOSCOPY;  Service: Gastroenterology;  Laterality: N/A;   ESOPHAGOGASTRODUODENOSCOPY (EGD) WITH PROPOFOL  N/A 05/11/2022   Procedure: ESOPHAGOGASTRODUODENOSCOPY (EGD) WITH PROPOFOL ;  Surgeon: Therisa Bi, MD;  Location: [facility] ENDOSCOPY;  Service: Gastroenterology;  Laterality: N/A;   EXPLORATORY LAPAROTOMY     IUD REMOVAL  07/25/2017   Procedure: INTRAUTERINE DEVICE (IUD) REMOVAL;  Surgeon: Janit Alm Agent, MD;  Location: [facility] ORS;  Service: Gynecology;;   LAPAROSCOPIC ASSISTED VAGINAL HYSTERECTOMY Bilateral 07/25/2017   Procedure: LAPAROSCOPIC ASSISTED VAGINAL HYSTERECTOMY WITH BILATERAL  SALPING OOPHERECTOMY;  Surgeon: Janit Alm Agent, MD;  Location: [facility] ORS;  Service: Gynecology;  Laterality: Bilateral;   POSTERIOR LUMBAR FUSION 2 WITH HARDWARE REMOVAL Left 10/18/2023   Procedure: ARTHROSCOPY, SHOULDER WITH DEBRIDEMENT, LEFT;  Surgeon: Addie Cordella Hamilton, MD;  Location: [facility] OR;  Service: Orthopedics;  Laterality: Left;  left shoulder arthroscopy, revision rotator cuff tear repair, possible cuff mend   SHOULDER ARTHROSCOPY WITH OPEN ROTATOR CUFF REPAIR AND DISTAL CLAVICLE ACROMINECTOMY Left 01/27/2023   Procedure: LEFT SHOULDER ARTHROSCOPY, DEBRIDEMENT, DISTAL CLAVICLE EXCISION, MINI OPEN ROTATOR CUFF TEAR REPAIR;  Surgeon: Addie Cordella Hamilton, MD;  Location: [facility] OR;  Service: Orthopedics;  Laterality: Left;   SHOULDER OPEN ROTATOR CUFF REPAIR Left 10/18/2023   Procedure: REPAIR, ROTATOR CUFF, OPEN;  Surgeon: Addie Cordella Hamilton, MD;  Location: [facility] OR;  Service: Orthopedics;  Laterality: Left;   TUBAL LIGATION     Patient Active Problem List   Diagnosis Date Noted   Synovitis of left shoulder 11/06/2023   Acute cystitis with hematuria 08/04/2023   Sepsis secondary to UTI (HCC) 08/01/2023   Obesity (BMI 30-39.9) 08/01/2023   Vertigo 02/25/2023   Complete tear of left rotator cuff 02/08/2023   Biceps tendonitis, left 02/08/2023   Arthritis of left acromioclavicular joint 02/08/2023   Memory change 12/06/2022   Excessive daytime sleepiness 12/06/2022   Prediabetes 07/29/2022   Persistent cough for 3 weeks or longer 01/21/2022   Iron  deficiency anemia 11/30/2021  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) 11/27/2021   Vitamin D  deficiency 11/27/2021   Chronic fatigue 11/27/2021   Paranoid delusion (HCC) 11/27/2021   GERD (gastroesophageal reflux disease) 04/25/2019   Hyperlipidemia, mixed 03/02/2019   Benign essential HTN 02/18/2019   Recurrent cystitis 11/27/2012   Intractable migraine with status migrainosus 09/19/2011   Chronic  constipation 06/25/2011    PCP: Gretta Comer POUR, NP  REFERRING PROVIDER: Addie Cordella Hamilton, MD  REFERRING DIAG: [id number] (ICD-10-CM) - Complete tear of left rotator cuff, unspecified whether traumatic  Rationale for Evaluation and Treatment: Rehabilitation  THERAPY DIAG:  Localized edema  Muscle weakness (generalized)  Stiffness of left shoulder, not elsewhere classified  Chronic left shoulder pain  Abnormal posture  ONSET DATE: dos: 10/18/23   SUBJECTIVE:                                                                                                                                                                                           SUBJECTIVE STATEMENT: Feeling less and less pain overall; no pain this morning  PERTINENT HISTORY:  Pt is s/p Lt RTC revision on 10/18/23. She is no longer using her sling. Anemia, migraines, depression, HTN, recent sepsis hospitalization, Lt RTC repair 10/24  PAIN:  NPRS scale: none currently Pain location: Lt shoulder Pain description: aching, throbbing Aggravating factors: accidentally moving arm Relieving factors: tylenol , oxy at night  PRECAUTIONS:  Shoulder and Other: PROM only until 11/30/23  RED FLAGS: None   WEIGHT BEARING RESTRICTIONS:  No  FALLS:  Has patient fallen in last 6 months? No  LIVING ENVIRONMENT: Lives with: lives with their spouse and lives with an adult companion Lives in: House/apartment  OCCUPATION:  Mudlogger  PLOF:  Independent and Leisure: spend time with grandchild  PATIENT GOALS:  Use arm without pain, regain function in arm   OBJECTIVE:  Note: Objective measures were completed at Evaluation unless otherwise noted.   PATIENT SURVEYS:  Patient-Specific Activity Scoring Scheme  0 represents "unable to perform." 10 represents "able to perform at prior level. 0 1 2 3 4 5 6 7 8 9  10 (Date and Score)   Activity Eval  01/13/24   1. Brushing hair 3   6  2. Working 0   0  3. UB dressing 4 6  4.Wash hair 3 6  Score 2.5 4.5   Total score = sum of the activity scores/number of activities Minimum detectable change (90%CI) for average score = 2 points Minimum detectable change (90%CI) for single activity score = 3 points    COGNITIVE STATUS: History of cognitive impairments - at  baseline   SENSATION: WFL  POSTURE:  rounded shoulders and forward head  HAND DOMINANCE:  Right  GAIT: 11/09/23 Comments: independent   EDEMA: 11/09/23 Not formally measured but does appear to have some swelling in Lt shoulder and UE   UPPER EXTREMITY ROM:  ROM Right eval Left eval Left 11/15/23 Left 12/07/23 Left 12/14/2023 Left 01/05/24  Shoulder flexion  P: 116 P: 115 A: 132 (supine) A: 114 (standing) 134 AROM in standing A: 132  (Sitting)  Shoulder abduction  P: 86 P: 100 A: 90 (sidelying) A: 85 (standing)  A: 108  (sitting)  Shoulder internal rotation  P: 60 in 45 deg abd (limited by abdomen)      Shoulder external rotation  P: 45 in 45 deg abduction  A: 60 (standing)     (Blank rows = not tested)   UPPER EXTREMITY MMT:  11/09/23: Deferred due to post-op status and precautions  MMT Right eval Left eval Left 12/14/2023  Shoulder flexion   4/5  Shoulder extension     Shoulder abduction     Shoulder adduction     Shoulder extension     Shoulder internal rotation     Shoulder external rotation     Middle trapezius     Lower trapezius     Elbow flexion     Elbow extension     Wrist flexion     Wrist extension     Wrist ulnar deviation     Wrist radial deviation     Wrist pronation     Wrist supination     Grip strength      (Blank rows = not tested)                 TREATMENT 01/17/24 TherAct UBE L3 x 8 min (alt fwd/bwd every 2 min) Standing shoulder flexion on Lt 2# 3x10 Standing shoulder abduction on Lt 2# 3x10 Standing chest press 1# on Lt 3x10 Rows L4 band 3x10; 5 sec hold Shoulder ext L4 band 3x10; 5 sec hold Sidelying ER 2# 3x10  on Lt Prone row on Lt 2# 3x10    01/13/24 TherAct Pulleys flexion and abduction x3 min each Rows L4 band 3x10; 5 sec hold Shoulder ext L4 band 3x10; 5 sec hold ER Lt L3 band 2x15 IR L3 band Lt 2x15 Lt shoulder flexion 2# 3x10 to 90 deg Standing Lt shoulder abduction 2# 3x10 to 90 deg Supine shoulder flexion with 2# to end range with 10 sec hold for motion x10 reps Sidelying shoulder abduction with 1# to end range with 10 sec hold for motion x10 reps Sidelying ER on Lt 3x10; 2#   01/05/24 TherAct Pulleys flexion and abduction x3 min each Wall ladder flexion and abduction 1.5# wrist weight 2x10 each on Lt Rows L3 band 5 sec hold; 3x10 Shoulder ext L3 band 3x10 sec ER Lt L3 band 3x10 IR L3 band Lt 3x10 Lt shoulder flexion 2# 3x10 to 90 deg Standing Lt shoulder abduction 1# 3x10 to 90 deg Wall plank with Lt shoulder taps 3x10 Lt arm ball on wall circles CW/CCW x 20 each    12/14/2023 Therex: Sidelying Lt shoulder ER c towel under arm 1 lb weight 2 x 20  Sidelying Lt shoulder abduction 2 x 20 1 lb  Sidelying Lt shoulder flexion 2 x 15   Additions to HEP with updated handout provided.   Neuro Re-ed Supine Lt shoulder in 90 deg flexion with mild to moderate stabilization resistance from clinician  15 sec bouts Standing green band rows bilateral c scapular retraction focus 2 x 15 Standing green band GH ext bilateral 2 x 15    TherActivity UBE fwd/back 3 mins each way lvl 2.5 with 1 min rest between directions.  Performed to improve ROM, push/pull, reaching for daily activity.  Standing Lt shoudler flexion with UE ranger assist with Lt foot step in x 15 with focus on slow lowering    12/13/23 TherEx UBE 2.5 each way   UT stretches TherAct IR/ER reactive isometrics R band 2x10 Tband Rows G 3x10 Chest press 2# bar 2x10 Sidelying Lt ER 1# 2x10 Sidelying Abd 2x10 Manual: PROM and scap mobs   PATIENT EDUCATION:  Education details: HEP Person educated:  Patient Education method: Programmer, multimedia, Facilities manager, and Handouts Education comprehension: verbalized understanding, returned demonstration, and needs further education  HOME EXERCISE PROGRAM: Access Code: [email or web address] URL: [email or web address] Date: 01/17/2024 Prepared by: Corean Ku  Exercises - Standing shoulder flexion wall slides  - 1-2 x daily - 7 x weekly - 1-2 sets - 10 reps - 5 hold - Standing Bilateral Low Shoulder Row with Anchored Resistance  - 1-2 x daily - 7 x weekly - 2-3 sets - 10-15 reps - Shoulder Extension with Resistance  - 1-2 x daily - 7 x weekly - 1-2 sets - 10-15 reps - Single Arm Shoulder Flexion with Dumbbell  - 1 x daily - 7 x weekly - 3 sets - 10 reps - Standing Single Arm Shoulder Abduction with Dumbbell - Thumb Up  - 1 x daily - 7 x weekly - 3 sets - 10 reps - Standing Chest Press with Dumbbells  - 1 x daily - 7 x weekly - 3 sets - 10 reps - Sidelying Shoulder ER with Towel and Dumbbell (Mirrored)  - 1 x daily - 7 x weekly - 3 sets - 10 reps  ASSESSMENT:  CLINICAL IMPRESSION: Updated HEP today to reflect more strengthening exercises.  Difficulty with standing chest press which is similar to job tasks that she will need to do.  Will continue to benefit from PT to maximize function.   OBJECTIVE IMPAIRMENTS: decreased coordination, decreased ROM, decreased strength, increased edema, increased fascial restrictions, increased muscle spasms, impaired UE functional use, and pain.   ACTIVITY LIMITATIONS: carrying, lifting, sleeping, bed mobility, bathing, toileting, dressing, reach over head, and hygiene/grooming  PARTICIPATION LIMITATIONS: meal prep, cleaning, laundry, driving, shopping, community activity, and occupation  PERSONAL FACTORS: Age, Past/current experiences, Time since onset of injury/illness/exacerbation, and 3+ comorbidities: Anemia, migraines, depression, HTN, recent sepsis hospitalization, Lt RTC repair 10/24 are also  affecting patient's functional outcome.   REHAB POTENTIAL: Good  CLINICAL DECISION MAKING: Evolving/moderate complexity  EVALUATION COMPLEXITY: Moderate   GOALS: Goals reviewed with patient? Yes  SHORT TERM GOALS: Target date: 12/07/2023  Independent with initial HEP Goal status: MET 01/13/24  2.  Lt shoulder PROM improved 15 deg all motions for improved mobility Goal status: met  3.  Report pain < 4/10 in Lt shoulder  Goal status: MET 01/13/24   LONG TERM GOALS: Target date: 02/01/2024  Independent with final HEP Goal status: on going 12/14/2023  2.  PSFS score improved by 3 points Goal status: on going 01/13/24  3.  Lt shoulder AROM improved to [facility] for improved function and strength Goal status: on going 12/14/2023  4.  Report pain < 2/10 with reaching and ADLs for improved function Goal status: on going 12/14/2023  5.  Demonstrate at least 4/5 Lt shoulder strength in  order to safety return to work Goal status: on going 12/14/2023  6.  Perform work simulated activities without increase in pain to safely return to work Goal status: on going 12/14/2023     PLAN:  PT FREQUENCY: 1x/wk x 3 weeks, then 2x/wk x 9 weeks  PT DURATION: 12 weeks  PLANNED INTERVENTIONS: 97164- PT Re-evaluation, 97750- Physical Performance Testing, 97110-Therapeutic exercises, 97530- Therapeutic activity, [id number]- Neuromuscular re-education, 97535- Self Care, 02859- Manual therapy, [id number]- Aquatic Therapy, H9716- Electrical stimulation (unattended), 97016- Vasopneumatic device, [id number]- Ultrasound, [id number]- Ionotophoresis 4mg /ml Dexamethasone , 79439 (1-2 muscles), 20561 (3+ muscles)- Dry Needling, Patient/Family education, Taping, Joint mobilization, Cryotherapy, and Moist heat.  PLAN FOR NEXT SESSION: work simulated exercises,  review updatedHEP,  Continue functional reach improvements.    PRECAUTIONS: full ROM and strengthening allowed starting 11/29/23   NEXT MD VISIT: 01/23/24    Corean JULIANNA Ku, PT, DPT 01/17/24 9:29 AM

## 2024-01-23 ENCOUNTER — Ambulatory Visit (INDEPENDENT_AMBULATORY_CARE_PROVIDER_SITE_OTHER): Admitting: Surgical

## 2024-01-23 ENCOUNTER — Ambulatory Visit (INDEPENDENT_AMBULATORY_CARE_PROVIDER_SITE_OTHER): Admitting: Physical Therapy

## 2024-01-23 ENCOUNTER — Encounter: Payer: Self-pay | Admitting: Surgical

## 2024-01-23 ENCOUNTER — Encounter: Payer: Self-pay | Admitting: Physical Therapy

## 2024-01-23 DIAGNOSIS — M75122 Complete rotator cuff tear or rupture of left shoulder, not specified as traumatic: Secondary | ICD-10-CM

## 2024-01-23 DIAGNOSIS — M25512 Pain in left shoulder: Secondary | ICD-10-CM

## 2024-01-23 DIAGNOSIS — R600 Localized edema: Secondary | ICD-10-CM

## 2024-01-23 DIAGNOSIS — M25612 Stiffness of left shoulder, not elsewhere classified: Secondary | ICD-10-CM

## 2024-01-23 DIAGNOSIS — G8929 Other chronic pain: Secondary | ICD-10-CM

## 2024-01-23 DIAGNOSIS — M6281 Muscle weakness (generalized): Secondary | ICD-10-CM | POA: Diagnosis not present

## 2024-01-23 DIAGNOSIS — R293 Abnormal posture: Secondary | ICD-10-CM

## 2024-01-23 MED ORDER — METHOCARBAMOL 500 MG PO TABS: 500.0000 mg | ORAL_TABLET | Freq: Three times a day (TID) | ORAL | 0 refills | Status: AC | PRN

## 2024-01-23 NOTE — Progress Notes (Signed)
 Post-Op Visit Note   Patient: [name]           Date of Birth: [birth year]           MRN: [id number] Visit Date: 01/23/2024 PCP: Gretta Comer POUR, NP   Assessment & Plan:  Chief Complaint:  Chief Complaint  Patient presents with   Left Shoulder - Follow-up    Left shoulder scope 10/18/2023   Visit Diagnoses:  1. Complete tear of left rotator cuff, unspecified whether traumatic     Plan: Patient is a 58 year old female who presents s/p left shoulder revision rotator cuff repair on 10/18/2023.  Overall she is doing well.  Has a couple sessions of PT left but she feels she is progressively getting better.  She feels ready for getting back to work.  She has 35 degrees X rotation, 80 degrees abduction, 140 degrees forward elevation passively and actively on exam today.  She actually has a little bit more active motion with scapulothoracic motion involved as well.  Incisions look well-healed.  She has 2+ radial pulse of the operative extremity.  Rotator cuff strength intact of supra, infra, subscap with about 90% strength of infraspinatus compared with contralateral side.  There is no significant coarseness or grinding with passive motion of the shoulder.  Plan at this time is refill methocarbamol .  Finish her physical therapy appointments.  We will get her back to work on 02/13/2024 and initially plan on 6-hour shifts for 2 weeks before returning to her regular 8-hour shifts.  She does a lot of repetitive reaching out in front of her and pulling back so recommended that she keep up with her regular exercise program and avoid any quick pulling motions.  Also would be beneficial for her to work on her back and lower trapezius strength and try and use this when she performs her job duties.  Follow-up with the office as needed if she has any recurrence of symptoms or concerns.  Follow-Up Instructions: No follow-ups on file.   Orders:  No orders of the defined types were placed in this  encounter.  No orders of the defined types were placed in this encounter.   Imaging: No results found.  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) 11/27/2021   Vitamin D  deficiency 11/27/2021   Chronic fatigue 11/27/2021   Paranoid delusion (HCC) 11/27/2021   GERD (gastroesophageal reflux disease) 04/25/2019   Hyperlipidemia, mixed 03/02/2019   Benign essential HTN 02/18/2019   Recurrent cystitis 11/27/2012   Intractable migraine with status migrainosus 09/19/2011   Chronic constipation 06/25/2011   Past Medical History:  Diagnosis Date   Anemia    BV (bacterial vaginosis) 11/27/2012   Celiac disease    Cough due to ACE inhibitor 04/25/2019   Depression    Essential hypertension    Family history of adverse reaction to anesthesia    MOM-HARD TIME WAKING UP  GERD (gastroesophageal reflux disease)    Migraine with visual aura    MIGRAINES   UTI (lower urinary tract infection)     Family History  Problem Relation Age of Onset   Hypertension Mother    Stroke Mother    Cancer Father    Breast cancer Maternal Aunt    Hypertension Maternal Grandmother    Colon cancer Maternal Grandmother    Breast cancer Maternal Grandmother    Skin cancer Maternal Grandmother    Hypertension Maternal Grandfather    Hypertension Paternal Grandmother    Diabetes Paternal Grandmother     Hypertension Paternal Grandfather    Diabetes Son 12       type 1   Breast cancer Other     Past Surgical History:  Procedure Laterality Date   ABDOMINAL HYSTERECTOMY     BICEPT TENODESIS Left 01/27/2023   Procedure: BICEPS TENODESIS;  Surgeon: Addie Cordella Hamilton, MD;  Location: [facility] OR;  Service: Orthopedics;  Laterality: Left;   BLADDER SUSPENSION     COLONOSCOPY WITH PROPOFOL  N/A 05/11/2022   Procedure: COLONOSCOPY WITH PROPOFOL ;  Surgeon: Therisa Bi, MD;  Location: [facility] ENDOSCOPY;  Service: Gastroenterology;  Laterality: N/A;   ESOPHAGOGASTRODUODENOSCOPY (EGD) WITH PROPOFOL  N/A 05/11/2022   Procedure: ESOPHAGOGASTRODUODENOSCOPY (EGD) WITH PROPOFOL ;  Surgeon: Therisa Bi, MD;  Location: [facility] ENDOSCOPY;  Service: Gastroenterology;  Laterality: N/A;   EXPLORATORY LAPAROTOMY     IUD REMOVAL  07/25/2017   Procedure: INTRAUTERINE DEVICE (IUD) REMOVAL;  Surgeon: Janit Alm Agent, MD;  Location: [facility] ORS;  Service: Gynecology;;   LAPAROSCOPIC ASSISTED VAGINAL HYSTERECTOMY Bilateral 07/25/2017   Procedure: LAPAROSCOPIC ASSISTED VAGINAL HYSTERECTOMY WITH BILATERAL SALPING OOPHERECTOMY;  Surgeon: Janit Alm Agent, MD;  Location: [facility] ORS;  Service: Gynecology;  Laterality: Bilateral;   POSTERIOR LUMBAR FUSION 2 WITH HARDWARE REMOVAL Left 10/18/2023   Procedure: ARTHROSCOPY, SHOULDER WITH DEBRIDEMENT, LEFT;  Surgeon: Addie Cordella Hamilton, MD;  Location: [facility] OR;  Service: Orthopedics;  Laterality: Left;  left shoulder arthroscopy, revision rotator cuff tear repair, possible cuff mend   SHOULDER ARTHROSCOPY WITH OPEN ROTATOR CUFF REPAIR AND DISTAL CLAVICLE ACROMINECTOMY Left 01/27/2023   Procedure: LEFT SHOULDER ARTHROSCOPY, DEBRIDEMENT, DISTAL CLAVICLE EXCISION, MINI OPEN ROTATOR CUFF TEAR REPAIR;  Surgeon: Addie Cordella Hamilton, MD;  Location: [facility] OR;  Service: Orthopedics;  Laterality: Left;   SHOULDER OPEN ROTATOR CUFF REPAIR Left 10/18/2023   Procedure: REPAIR, ROTATOR CUFF, OPEN;  Surgeon: Addie Cordella Hamilton, MD;  Location: [facility] OR;  Service: Orthopedics;  Laterality: Left;   TUBAL LIGATION     Social History   Occupational History   Occupation: Corporate treasurer  Tobacco Use   Smoking status: Never   Smokeless tobacco: Never  Vaping Use   Vaping status: Never Used  Substance and Sexual Activity   Alcohol use: No    Alcohol/week: 0.0 standard drinks of alcohol   Drug use: No   Sexual activity: Yes    Partners: Female    Birth control/protection: Surgical    Comment: INTERCOURSE AGE 73, SEXUAL PARTNERS LEES THAN 6

## 2024-01-23 NOTE — Therapy (Signed)
 OUTPATIENT PHYSICAL THERAPY TREATMENT  Patient Name: [name] MRN: [id number] DOB:April 25, 1965, 58 y.o., female Today's Date: 01/23/2024  END OF SESSION:  PT End of Session - 01/23/24 1202     Visit Number 10    Number of Visits 21    Date for Recertification  02/01/24    Authorization Type [employer]    PT Start Time 1145    PT Stop Time 1225    PT Time Calculation (min) 40 min    Activity Tolerance Patient tolerated treatment well    Behavior During Therapy [facility] for tasks assessed/performed                    Past Medical History:  Diagnosis Date   Anemia    BV (bacterial vaginosis) 11/27/2012   Celiac disease    Cough due to ACE inhibitor 04/25/2019   Depression    Essential hypertension    Family history of adverse reaction to anesthesia    MOM-HARD TIME WAKING UP   GERD (gastroesophageal reflux disease)    Migraine with visual aura    MIGRAINES   UTI (lower urinary tract infection)    Past Surgical History:  Procedure Laterality Date   ABDOMINAL HYSTERECTOMY     BICEPT TENODESIS Left 01/27/2023   Procedure: BICEPS TENODESIS;  Surgeon: Addie Cordella Hamilton, MD;  Location: [facility] OR;  Service: Orthopedics;  Laterality: Left;   BLADDER SUSPENSION     COLONOSCOPY WITH PROPOFOL  N/A 05/11/2022   Procedure: COLONOSCOPY WITH PROPOFOL ;  Surgeon: Therisa Bi, MD;  Location: [facility] ENDOSCOPY;  Service: Gastroenterology;  Laterality: N/A;   ESOPHAGOGASTRODUODENOSCOPY (EGD) WITH PROPOFOL  N/A 05/11/2022   Procedure: ESOPHAGOGASTRODUODENOSCOPY (EGD) WITH PROPOFOL ;  Surgeon: Therisa Bi, MD;  Location: [facility] ENDOSCOPY;  Service: Gastroenterology;  Laterality: N/A;   EXPLORATORY LAPAROTOMY     IUD REMOVAL  07/25/2017   Procedure: INTRAUTERINE DEVICE (IUD) REMOVAL;  Surgeon: Janit Alm Agent, MD;  Location: [facility] ORS;  Service: Gynecology;;   LAPAROSCOPIC ASSISTED VAGINAL HYSTERECTOMY Bilateral 07/25/2017   Procedure: LAPAROSCOPIC ASSISTED VAGINAL HYSTERECTOMY WITH BILATERAL  SALPING OOPHERECTOMY;  Surgeon: Janit Alm Agent, MD;  Location: [facility] ORS;  Service: Gynecology;  Laterality: Bilateral;   POSTERIOR LUMBAR FUSION 2 WITH HARDWARE REMOVAL Left 10/18/2023   Procedure: ARTHROSCOPY, SHOULDER WITH DEBRIDEMENT, LEFT;  Surgeon: Addie Cordella Hamilton, MD;  Location: [facility] OR;  Service: Orthopedics;  Laterality: Left;  left shoulder arthroscopy, revision rotator cuff tear repair, possible cuff mend   SHOULDER ARTHROSCOPY WITH OPEN ROTATOR CUFF REPAIR AND DISTAL CLAVICLE ACROMINECTOMY Left 01/27/2023   Procedure: LEFT SHOULDER ARTHROSCOPY, DEBRIDEMENT, DISTAL CLAVICLE EXCISION, MINI OPEN ROTATOR CUFF TEAR REPAIR;  Surgeon: Addie Cordella Hamilton, MD;  Location: [facility] OR;  Service: Orthopedics;  Laterality: Left;   SHOULDER OPEN ROTATOR CUFF REPAIR Left 10/18/2023   Procedure: REPAIR, ROTATOR CUFF, OPEN;  Surgeon: Addie Cordella Hamilton, MD;  Location: [facility] OR;  Service: Orthopedics;  Laterality: Left;   TUBAL LIGATION     Patient Active Problem List   Diagnosis Date Noted   Synovitis of left shoulder 11/06/2023   Acute cystitis with hematuria 08/04/2023   Sepsis secondary to UTI (HCC) 08/01/2023   Obesity (BMI 30-39.9) 08/01/2023   Vertigo 02/25/2023   Complete tear of left rotator cuff 02/08/2023   Biceps tendonitis, left 02/08/2023   Arthritis of left acromioclavicular joint 02/08/2023   Memory change 12/06/2022   Excessive daytime sleepiness 12/06/2022   Prediabetes 07/29/2022   Persistent cough for 3 weeks or longer 01/21/2022   Iron  deficiency anemia  11/30/2021   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) 11/27/2021   Vitamin D  deficiency 11/27/2021   Chronic fatigue 11/27/2021   Paranoid delusion (HCC) 11/27/2021   GERD (gastroesophageal reflux disease) 04/25/2019   Hyperlipidemia, mixed 03/02/2019   Benign essential HTN 02/18/2019   Recurrent cystitis 11/27/2012   Intractable migraine with status migrainosus 09/19/2011   Chronic  constipation 06/25/2011    PCP: Gretta Comer POUR, NP  REFERRING PROVIDER: Addie Cordella Hamilton, MD  REFERRING DIAG: [id number] (ICD-10-CM) - Complete tear of left rotator cuff, unspecified whether traumatic  Rationale for Evaluation and Treatment: Rehabilitation  THERAPY DIAG:  Localized edema  Muscle weakness (generalized)  Stiffness of left shoulder, not elsewhere classified  Chronic left shoulder pain  Abnormal posture  ONSET DATE: dos: 10/18/23   SUBJECTIVE:                                                                                                                                                                                           SUBJECTIVE STATEMENT: Pt reporting no pain upon arrival with shoulder during basic ADL's. Pt did report mild pain with end ranges.   PERTINENT HISTORY:  Pt is s/p Lt RTC revision on 10/18/23. She is no longer using her sling. Anemia, migraines, depression, HTN, recent sepsis hospitalization, Lt RTC repair 10/24  PAIN:  NPRS scale: no pain upon arrival Pain location: Lt shoulder Pain description: aching, throbbing Aggravating factors: accidentally moving arm Relieving factors: tylenol , oxy at night  PRECAUTIONS:  Shoulder and Other: PROM only until 11/30/23  RED FLAGS: None   WEIGHT BEARING RESTRICTIONS:  No  FALLS:  Has patient fallen in last 6 months? No  LIVING ENVIRONMENT: Lives with: lives with their spouse and lives with an adult companion Lives in: House/apartment  OCCUPATION:  Mudlogger  PLOF:  Independent and Leisure: spend time with grandchild  PATIENT GOALS:  Use arm without pain, regain function in arm   OBJECTIVE:  Note: Objective measures were completed at Evaluation unless otherwise noted.   PATIENT SURVEYS:  Patient-Specific Activity Scoring Scheme  0 represents "unable to perform." 10 represents "able to perform at prior level. 0 1 2 3 4 5 6 7 8 9  10 (Date and  Score)   Activity Eval  01/13/24   1. Brushing hair 3   6  2. Working 0  0  3. UB dressing 4 6  4.Wash hair 3 6  Score 2.5 4.5   Total score = sum of the activity scores/number of activities Minimum detectable change (90%CI) for average score = 2 points Minimum detectable change (90%CI) for single activity  score = 3 points    COGNITIVE STATUS: History of cognitive impairments - at baseline   SENSATION: WFL  POSTURE:  rounded shoulders and forward head  HAND DOMINANCE:  Right  GAIT: 11/09/23 Comments: independent   EDEMA: 11/09/23 Not formally measured but does appear to have some swelling in Lt shoulder and UE   UPPER EXTREMITY ROM:  ROM Right eval Left eval Left 11/15/23 Left 12/07/23 Left 12/14/2023 Left 01/05/24 Left 01/23/24  Shoulder flexion  P: 116 P: 115 A: 132 (supine) A: 114 (standing) 134 AROM in standing A: 132  (Sitting) A:   Shoulder abduction  P: 86 P: 100 A: 90 (sidelying) A: 85 (standing)  A: 108  (sitting)   Shoulder internal rotation  P: 60 in 45 deg abd (limited by abdomen)       Shoulder external rotation  P: 45 in 45 deg abduction  A: 60 (standing)      (Blank rows = not tested)   UPPER EXTREMITY MMT:  11/09/23: Deferred due to post-op status and precautions  MMT Right eval Left eval Left 12/14/2023  Shoulder flexion   4/5  Shoulder extension     Shoulder abduction     Shoulder adduction     Shoulder extension     Shoulder internal rotation     Shoulder external rotation     Middle trapezius     Lower trapezius     Elbow flexion     Elbow extension     Wrist flexion     Wrist extension     Wrist ulnar deviation     Wrist radial deviation     Wrist pronation     Wrist supination     Grip strength      (Blank rows = not tested)                 TREATMENT 01/23/24 TherExercises:  Pulleys: x 2 minutes flexion and abduction TherAct UBE L3 x 8 min (alt fwd/bwd every 2 min) Standing shoulder flexion bil UE's 2# 2 x 15   Standing shoulder abduction bil 2# 2 x 15  Standing chest press 3 # bar x 15  Rows L4 band  2 x 15 holding 3 sec scapular squeeze Shoulder Ext L4 band 2 x 15 c 3  sec hold Shoulder extension bil UE's c 3 # bar  Standing shoulder flexion: L2 band 2 x 10 (flexion to 80 deg) Pushing cart with 20# weight for 100 feet  c bil UE's Standing with elbow against yellow physio ball performing left  shoulder abd 75 deg 2 x 10  NMR:  Scapular stabilization shoulder at 90 deg ball presses and circles x 20 both directions   01/17/24 TherAct UBE L3 x 8 min (alt fwd/bwd every 2 min) Standing shoulder flexion on Lt 2# 3x10 Standing shoulder abduction on Lt 2# 3x10 Standing chest press 1# on Lt 3x10 Rows L4 band 3x10; 5 sec hold Shoulder ext L4 band 3x10; 5 sec hold Sidelying ER 2# 3x10 on Lt Prone row on Lt 2# 3x10    01/13/24 TherAct Pulleys flexion and abduction x3 min each Rows L4 band 3x10; 5 sec hold Shoulder ext L4 band 3x10; 5 sec hold ER Lt L3 band 2x15 IR L3 band Lt 2x15 Lt shoulder flexion 2# 3x10 to 90 deg Standing Lt shoulder abduction 2# 3x10 to 90 deg Supine shoulder flexion with 2# to end range with 10 sec hold for motion x10 reps Sidelying shoulder abduction  with 1# to end range with 10 sec hold for motion x10 reps Sidelying ER on Lt 3x10; 2#    PATIENT EDUCATION:  Education details: HEP Person educated: Patient Education method: Programmer, multimedia, Facilities manager, and Handouts Education comprehension: verbalized understanding, returned demonstration, and needs further education  HOME EXERCISE PROGRAM: Access Code: [email or web address] URL: [email or web address] Date: 01/17/2024 Prepared by: Corean Ku  Exercises - Standing shoulder flexion wall slides  - 1-2 x daily - 7 x weekly - 1-2 sets - 10 reps - 5 hold - Standing Bilateral Low Shoulder Row with Anchored Resistance  - 1-2 x daily - 7 x weekly - 2-3 sets - 10-15 reps - Shoulder Extension with Resistance  -  1-2 x daily - 7 x weekly - 1-2 sets - 10-15 reps - Single Arm Shoulder Flexion with Dumbbell  - 1 x daily - 7 x weekly - 3 sets - 10 reps - Standing Single Arm Shoulder Abduction with Dumbbell - Thumb Up  - 1 x daily - 7 x weekly - 3 sets - 10 reps - Standing Chest Press with Dumbbells  - 1 x daily - 7 x weekly - 3 sets - 10 reps - Sidelying Shoulder ER with Towel and Dumbbell (Mirrored)  - 1 x daily - 7 x weekly - 3 sets - 10 reps  ASSESSMENT:  CLINICAL IMPRESSION: Pt reporting her HEP is going well with updated at her last visit. Treatment focusing on tasks that mimic work related skills of pushing and pulling. Recommending continuation of skilled PT interventions.    OBJECTIVE IMPAIRMENTS: decreased coordination, decreased ROM, decreased strength, increased edema, increased fascial restrictions, increased muscle spasms, impaired UE functional use, and pain.   ACTIVITY LIMITATIONS: carrying, lifting, sleeping, bed mobility, bathing, toileting, dressing, reach over head, and hygiene/grooming  PARTICIPATION LIMITATIONS: meal prep, cleaning, laundry, driving, shopping, community activity, and occupation  PERSONAL FACTORS: Age, Past/current experiences, Time since onset of injury/illness/exacerbation, and 3+ comorbidities: Anemia, migraines, depression, HTN, recent sepsis hospitalization, Lt RTC repair 10/24 are also affecting patient's functional outcome.   REHAB POTENTIAL: Good  CLINICAL DECISION MAKING: Evolving/moderate complexity  EVALUATION COMPLEXITY: Moderate   GOALS: Goals reviewed with patient? Yes  SHORT TERM GOALS: Target date: 12/07/2023  Independent with initial HEP Goal status: MET 01/13/24  2.  Lt shoulder PROM improved 15 deg all motions for improved mobility Goal status: met  3.  Report pain < 4/10 in Lt shoulder  Goal status: MET 01/13/24   LONG TERM GOALS: Target date: 02/01/2024  Independent with final HEP Goal status: on going 12/14/2023  2.  PSFS score  improved by 3 points Goal status: on going 01/13/24  3.  Lt shoulder AROM improved to [facility] for improved function and strength Goal status: on going 12/14/2023  4.  Report pain < 2/10 with reaching and ADLs for improved function Goal status: on going 12/14/2023  5.  Demonstrate at least 4/5 Lt shoulder strength in order to safety return to work Goal status: on going 12/14/2023  6.  Perform work simulated activities without increase in pain to safely return to work Goal status: on going 12/14/2023     PLAN:  PT FREQUENCY: 1x/wk x 3 weeks, then 2x/wk x 9 weeks  PT DURATION: 12 weeks  PLANNED INTERVENTIONS: 97164- PT Re-evaluation, 97750- Physical Performance Testing, 97110-Therapeutic exercises, 97530- Therapeutic activity, [id number]- Neuromuscular re-education, 97535- Self Care, [id number]- Manual therapy, [id number]- Aquatic Therapy, H9716- Electrical stimulation (unattended), 97016- Vasopneumatic device, [id number]- Ultrasound, [id number]- Ionotophoresis 4mg /ml Dexamethasone , 79439 (1-2 muscles), 79438 (  3+ muscles)- Dry Needling, Patient/Family education, Taping, Joint mobilization, Cryotherapy, and Moist heat.  PLAN FOR NEXT SESSION: work simulated exercises,  review updatedHEP,  Continue functional reach improvements.    PRECAUTIONS: full ROM and strengthening allowed starting 11/29/23   NEXT MD VISIT: 01/23/24    Delon Lunger, PT, MPT 01/23/24 12:28 PM   01/23/24 12:28 PM

## 2024-01-30 ENCOUNTER — Encounter: Payer: Self-pay | Admitting: Physical Therapy

## 2024-01-30 ENCOUNTER — Ambulatory Visit (INDEPENDENT_AMBULATORY_CARE_PROVIDER_SITE_OTHER): Admitting: Physical Therapy

## 2024-01-30 DIAGNOSIS — R600 Localized edema: Secondary | ICD-10-CM

## 2024-01-30 DIAGNOSIS — R293 Abnormal posture: Secondary | ICD-10-CM

## 2024-01-30 DIAGNOSIS — M25512 Pain in left shoulder: Secondary | ICD-10-CM | POA: Diagnosis not present

## 2024-01-30 DIAGNOSIS — M6281 Muscle weakness (generalized): Secondary | ICD-10-CM | POA: Diagnosis not present

## 2024-01-30 DIAGNOSIS — G8929 Other chronic pain: Secondary | ICD-10-CM

## 2024-01-30 DIAGNOSIS — M25612 Stiffness of left shoulder, not elsewhere classified: Secondary | ICD-10-CM | POA: Diagnosis not present

## 2024-01-30 NOTE — Therapy (Signed)
 OUTPATIENT PHYSICAL THERAPY TREATMENT  Patient Name: Kapri Nero MRN: [id number] DOB:January 18, 1966, 58 y.o., female Today's Date: 01/30/2024  END OF SESSION:  PT End of Session - 01/30/24 1147     Visit Number 11    Number of Visits 21    Date for Recertification  02/01/24    Authorization Type [employer]    PT Start Time 1147    PT Stop Time 1227    PT Time Calculation (min) 40 min    Activity Tolerance Patient tolerated treatment well    Behavior During Therapy [facility] for tasks assessed/performed                     Past Medical History:  Diagnosis Date   Anemia    BV (bacterial vaginosis) 11/27/2012   Celiac disease    Cough due to ACE inhibitor 04/25/2019   Depression    Essential hypertension    Family history of adverse reaction to anesthesia    MOM-HARD TIME WAKING UP   GERD (gastroesophageal reflux disease)    Migraine with visual aura    MIGRAINES   UTI (lower urinary tract infection)    Past Surgical History:  Procedure Laterality Date   ABDOMINAL HYSTERECTOMY     BICEPT TENODESIS Left 01/27/2023   Procedure: BICEPS TENODESIS;  Surgeon: Addie Cordella Hamilton, MD;  Location: [facility] OR;  Service: Orthopedics;  Laterality: Left;   BLADDER SUSPENSION     COLONOSCOPY WITH PROPOFOL  N/A 05/11/2022   Procedure: COLONOSCOPY WITH PROPOFOL ;  Surgeon: Therisa Bi, MD;  Location: [facility] ENDOSCOPY;  Service: Gastroenterology;  Laterality: N/A;   ESOPHAGOGASTRODUODENOSCOPY (EGD) WITH PROPOFOL  N/A 05/11/2022   Procedure: ESOPHAGOGASTRODUODENOSCOPY (EGD) WITH PROPOFOL ;  Surgeon: Therisa Bi, MD;  Location: [facility] ENDOSCOPY;  Service: Gastroenterology;  Laterality: N/A;   EXPLORATORY LAPAROTOMY     IUD REMOVAL  07/25/2017   Procedure: INTRAUTERINE DEVICE (IUD) REMOVAL;  Surgeon: Janit Alm Agent, MD;  Location: [facility] ORS;  Service: Gynecology;;   LAPAROSCOPIC ASSISTED VAGINAL HYSTERECTOMY Bilateral 07/25/2017   Procedure: LAPAROSCOPIC ASSISTED VAGINAL HYSTERECTOMY WITH BILATERAL  SALPING OOPHERECTOMY;  Surgeon: Janit Alm Agent, MD;  Location: [facility] ORS;  Service: Gynecology;  Laterality: Bilateral;   POSTERIOR LUMBAR FUSION 2 WITH HARDWARE REMOVAL Left 10/18/2023   Procedure: ARTHROSCOPY, SHOULDER WITH DEBRIDEMENT, LEFT;  Surgeon: Addie Cordella Hamilton, MD;  Location: [facility] OR;  Service: Orthopedics;  Laterality: Left;  left shoulder arthroscopy, revision rotator cuff tear repair, possible cuff mend   SHOULDER ARTHROSCOPY WITH OPEN ROTATOR CUFF REPAIR AND DISTAL CLAVICLE ACROMINECTOMY Left 01/27/2023   Procedure: LEFT SHOULDER ARTHROSCOPY, DEBRIDEMENT, DISTAL CLAVICLE EXCISION, MINI OPEN ROTATOR CUFF TEAR REPAIR;  Surgeon: Addie Cordella Hamilton, MD;  Location: [facility] OR;  Service: Orthopedics;  Laterality: Left;   SHOULDER OPEN ROTATOR CUFF REPAIR Left 10/18/2023   Procedure: REPAIR, ROTATOR CUFF, OPEN;  Surgeon: Addie Cordella Hamilton, MD;  Location: [facility] OR;  Service: Orthopedics;  Laterality: Left;   TUBAL LIGATION     Patient Active Problem List   Diagnosis Date Noted   Synovitis of left shoulder 11/06/2023   Acute cystitis with hematuria 08/04/2023   Sepsis secondary to UTI (HCC) 08/01/2023   Obesity (BMI 30-39.9) 08/01/2023   Vertigo 02/25/2023   Complete tear of left rotator cuff 02/08/2023   Biceps tendonitis, left 02/08/2023   Arthritis of left acromioclavicular joint 02/08/2023   Memory change 12/06/2022   Excessive daytime sleepiness 12/06/2022   Prediabetes 07/29/2022   Persistent cough for 3 weeks or longer 01/21/2022   Iron  deficiency  anemia 11/30/2021   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) 11/27/2021   Vitamin D  deficiency 11/27/2021   Chronic fatigue 11/27/2021   Paranoid delusion (HCC) 11/27/2021   GERD (gastroesophageal reflux disease) 04/25/2019   Hyperlipidemia, mixed 03/02/2019   Benign essential HTN 02/18/2019   Recurrent cystitis 11/27/2012   Intractable migraine with status migrainosus 09/19/2011   Chronic  constipation 06/25/2011    PCP: Gretta Comer POUR, NP  REFERRING PROVIDER: Addie Cordella Hamilton, MD  REFERRING DIAG: [phone] (ICD-10-CM) - Complete tear of left rotator cuff, unspecified whether traumatic  Rationale for Evaluation and Treatment: Rehabilitation  THERAPY DIAG:  Localized edema  Muscle weakness (generalized)  Stiffness of left shoulder, not elsewhere classified  Chronic left shoulder pain  Abnormal posture  ONSET DATE: dos: 10/18/23   SUBJECTIVE:                                                                                                                                                                                           SUBJECTIVE STATEMENT: Released from surgeon, returns to work 11/3  PERTINENT HISTORY:  Pt is s/p Lt RTC revision on 10/18/23. She is no longer using her sling. Anemia, migraines, depression, HTN, recent sepsis hospitalization, Lt RTC repair 10/24  PAIN:  NPRS scale: no pain upon arrival Pain location: Lt shoulder Pain description: aching, throbbing Aggravating factors: accidentally moving arm Relieving factors: tylenol , oxy at night  PRECAUTIONS:  Shoulder and Other: PROM only until 11/30/23  RED FLAGS: None   WEIGHT BEARING RESTRICTIONS:  No  FALLS:  Has patient fallen in last 6 months? No  LIVING ENVIRONMENT: Lives with: lives with their spouse and lives with an adult companion Lives in: House/apartment  OCCUPATION:  Mudlogger  PLOF:  Independent and Leisure: spend time with grandchild  PATIENT GOALS:  Use arm without pain, regain function in arm   OBJECTIVE:  Note: Objective measures were completed at Evaluation unless otherwise noted.   PATIENT SURVEYS:  Patient-Specific Activity Scoring Scheme  0 represents "unable to perform." 10 represents "able to perform at prior level. 0 1 2 3 4 5 6 7 8 9  10 (Date and Score)   Activity Eval  01/13/24   1. Brushing hair 3   6  2. Working 0  0  3.  UB dressing 4 6  4.Wash hair 3 6  Score 2.5 4.5   Total score = sum of the activity scores/number of activities Minimum detectable change (90%CI) for average score = 2 points Minimum detectable change (90%CI) for single activity score = 3 points    COGNITIVE STATUS: History of cognitive  impairments - at baseline   SENSATION: WFL  POSTURE:  rounded shoulders and forward head  HAND DOMINANCE:  Right  GAIT: 11/09/23 Comments: independent   EDEMA: 11/09/23 Not formally measured but does appear to have some swelling in Lt shoulder and UE   UPPER EXTREMITY ROM:  ROM Right eval Left eval Left 11/15/23 Left 12/07/23 Left 12/14/2023 Left 01/05/24 Left 01/23/24  Shoulder flexion  P: 116 P: 115 A: 132 (supine) A: 114 (standing) 134 AROM in standing A: 132  (Sitting) A:   Shoulder abduction  P: 86 P: 100 A: 90 (sidelying) A: 85 (standing)  A: 108  (sitting)   Shoulder internal rotation  P: 60 in 45 deg abd (limited by abdomen)       Shoulder external rotation  P: 45 in 45 deg abduction  A: 60 (standing)      (Blank rows = not tested)   UPPER EXTREMITY MMT:  11/09/23: Deferred due to post-op status and precautions  MMT Right eval Left eval Left 12/14/2023  Shoulder flexion   4/5  Shoulder extension     Shoulder abduction     Shoulder adduction     Shoulder extension     Shoulder internal rotation     Shoulder external rotation     Middle trapezius     Lower trapezius     Elbow flexion     Elbow extension     Wrist flexion     Wrist extension     Wrist ulnar deviation     Wrist radial deviation     Wrist pronation     Wrist supination     Grip strength      (Blank rows = not tested)                 TREATMENT 01/30/24 TherAct Pulleys x 3 min flexion and scaption UBE L4 x 8 min (4 min each direction) Work simulated IR with slight horizontal adduction 3x10; L4 band on Lt Work simulated chest press with horizontal adduction cross body 3x10, L4 band on  Lt Reaching with horizontal abduction/adduction completing 360 deg with 1# in LUE; 1:30, 1:10, 1:50 Standing flexion 10x 20 sec and abduction 5x10 sec on Lt 1# DB   01/23/24 TherExercises:  Pulleys: x 2 minutes flexion and abduction TherAct UBE L3 x 8 min (alt fwd/bwd every 2 min) Standing shoulder flexion bil UE's 2# 2 x 15  Standing shoulder abduction bil 2# 2 x 15  Standing chest press 3 # bar x 15  Rows L4 band  2 x 15 holding 3 sec scapular squeeze Shoulder Ext L4 band 2 x 15 c 3  sec hold Shoulder extension bil UE's c 3 # bar  Standing shoulder flexion: L2 band 2 x 10 (flexion to 80 deg) Pushing cart with 20# weight for 100 feet  c bil UE's Standing with elbow against yellow physio ball performing left  shoulder abd 75 deg 2 x 10  NMR:  Scapular stabilization shoulder at 90 deg ball presses and circles x 20 both directions   01/17/24 TherAct UBE L3 x 8 min (alt fwd/bwd every 2 min) Standing shoulder flexion on Lt 2# 3x10 Standing shoulder abduction on Lt 2# 3x10 Standing chest press 1# on Lt 3x10 Rows L4 band 3x10; 5 sec hold Shoulder ext L4 band 3x10; 5 sec hold Sidelying ER 2# 3x10 on Lt Prone row on Lt 2# 3x10    01/13/24 TherAct Pulleys flexion and abduction x3 min each  Rows L4 band 3x10; 5 sec hold Shoulder ext L4 band 3x10; 5 sec hold ER Lt L3 band 2x15 IR L3 band Lt 2x15 Lt shoulder flexion 2# 3x10 to 90 deg Standing Lt shoulder abduction 2# 3x10 to 90 deg Supine shoulder flexion with 2# to end range with 10 sec hold for motion x10 reps Sidelying shoulder abduction with 1# to end range with 10 sec hold for motion x10 reps Sidelying ER on Lt 3x10; 2#    PATIENT EDUCATION:  Education details: HEP Person educated: Patient Education method: Programmer, multimedia, Facilities manager, and Handouts Education comprehension: verbalized understanding, returned demonstration, and needs further education  HOME EXERCISE PROGRAM: Access Code: [email or web address] URL:  [email or web address] Date: 01/17/2024 Prepared by: Corean Ku  Exercises - Standing shoulder flexion wall slides  - 1-2 x daily - 7 x weekly - 1-2 sets - 10 reps - 5 hold - Standing Bilateral Low Shoulder Row with Anchored Resistance  - 1-2 x daily - 7 x weekly - 2-3 sets - 10-15 reps - Shoulder Extension with Resistance  - 1-2 x daily - 7 x weekly - 1-2 sets - 10-15 reps - Single Arm Shoulder Flexion with Dumbbell  - 1 x daily - 7 x weekly - 3 sets - 10 reps - Standing Single Arm Shoulder Abduction with Dumbbell - Thumb Up  - 1 x daily - 7 x weekly - 3 sets - 10 reps - Standing Chest Press with Dumbbells  - 1 x daily - 7 x weekly - 3 sets - 10 reps - Sidelying Shoulder ER with Towel and Dumbbell (Mirrored)  - 1 x daily - 7 x weekly - 3 sets - 10 reps  ASSESSMENT:  CLINICAL IMPRESSION: Pt tolerated session well with continued activities simulating work tasks.  She's planning to d/c next visit and return to work 11/3.    OBJECTIVE IMPAIRMENTS: decreased coordination, decreased ROM, decreased strength, increased edema, increased fascial restrictions, increased muscle spasms, impaired UE functional use, and pain.   ACTIVITY LIMITATIONS: carrying, lifting, sleeping, bed mobility, bathing, toileting, dressing, reach over head, and hygiene/grooming  PARTICIPATION LIMITATIONS: meal prep, cleaning, laundry, driving, shopping, community activity, and occupation  PERSONAL FACTORS: Age, Past/current experiences, Time since onset of injury/illness/exacerbation, and 3+ comorbidities: Anemia, migraines, depression, HTN, recent sepsis hospitalization, Lt RTC repair 10/24 are also affecting patient's functional outcome.   REHAB POTENTIAL: Good  CLINICAL DECISION MAKING: Evolving/moderate complexity  EVALUATION COMPLEXITY: Moderate   GOALS: Goals reviewed with patient? Yes  SHORT TERM GOALS: Target date: 12/07/2023  Independent with initial HEP Goal status: MET  01/13/24  2.  Lt shoulder PROM improved 15 deg all motions for improved mobility Goal status: met  3.  Report pain < 4/10 in Lt shoulder  Goal status: MET 01/13/24   LONG TERM GOALS: Target date: 02/01/2024  Independent with final HEP Goal status: on going 12/14/2023  2.  PSFS score improved by 3 points Goal status: on going 01/13/24  3.  Lt shoulder AROM improved to [facility] for improved function and strength Goal status: on going 12/14/2023  4.  Report pain < 2/10 with reaching and ADLs for improved function Goal status: on going 12/14/2023  5.  Demonstrate at least 4/5 Lt shoulder strength in order to safety return to work Goal status: on going 12/14/2023  6.  Perform work simulated activities without increase in pain to safely return to work Goal status: on going 12/14/2023     PLAN:  PT FREQUENCY: 1x/wk x 3 weeks, then 2x/wk x  9 weeks  PT DURATION: 12 weeks  PLANNED INTERVENTIONS: 97164- PT Re-evaluation, 97750- Physical Performance Testing, 97110-Therapeutic exercises, 97530- Therapeutic activity, [id number]- Neuromuscular re-education, 97535- Self Care, [id number]- Manual therapy, [phone]- Aquatic Therapy, [id number]- Electrical stimulation (unattended), 97016- Vasopneumatic device, [id number]- Ultrasound, [id number]- Ionotophoresis 4mg /ml Dexamethasone , 79439 (1-2 muscles), 20561 (3+ muscles)- Dry Needling, Patient/Family education, Taping, Joint mobilization, Cryotherapy, and Moist heat.  PLAN FOR NEXT SESSION: check goals and plan for d/c from PT   PRECAUTIONS: full ROM and strengthening allowed starting 11/29/23   NEXT MD VISIT: PRN   Corean JULIANNA Ku, PT, DPT 01/30/24 12:40 PM

## 2024-02-06 ENCOUNTER — Ambulatory Visit: Admitting: Physical Therapy

## 2024-02-06 ENCOUNTER — Encounter: Payer: Self-pay | Admitting: Physical Therapy

## 2024-02-06 DIAGNOSIS — R600 Localized edema: Secondary | ICD-10-CM

## 2024-02-06 DIAGNOSIS — G8929 Other chronic pain: Secondary | ICD-10-CM

## 2024-02-06 DIAGNOSIS — M25512 Pain in left shoulder: Secondary | ICD-10-CM

## 2024-02-06 DIAGNOSIS — M25612 Stiffness of left shoulder, not elsewhere classified: Secondary | ICD-10-CM | POA: Diagnosis not present

## 2024-02-06 DIAGNOSIS — R293 Abnormal posture: Secondary | ICD-10-CM

## 2024-02-06 DIAGNOSIS — M6281 Muscle weakness (generalized): Secondary | ICD-10-CM | POA: Diagnosis not present

## 2024-02-06 NOTE — Therapy (Signed)
 OUTPATIENT PHYSICAL THERAPY TREATMENT RECERTIFICATION DISCHARGE SUMMARY  Patient Name: [name] MRN: [id number] DOB:04-29-1965, 58 y.o., female Today's Date: 02/06/2024  END OF SESSION:  PT End of Session - 02/06/24 1135     Visit Number 12    Number of Visits 21    Date for Recertification  02/06/24    Authorization Type U[employer]    PT Start Time 1136    PT Stop Time 1200    PT Time Calculation (min) 24 min    Activity Tolerance Patient tolerated treatment well    Behavior During Therapy [facility] for tasks assessed/performed                      Past Medical History:  Diagnosis Date   Anemia    BV (bacterial vaginosis) 11/27/2012   Celiac disease    Cough due to ACE inhibitor 04/25/2019   Depression    Essential hypertension    Family history of adverse reaction to anesthesia    MOM-HARD TIME WAKING UP   GERD (gastroesophageal reflux disease)    Migraine with visual aura    MIGRAINES   UTI (lower urinary tract infection)    Past Surgical History:  Procedure Laterality Date   ABDOMINAL HYSTERECTOMY     BICEPT TENODESIS Left 01/27/2023   Procedure: BICEPS TENODESIS;  Surgeon: Addie Cordella Hamilton, MD;  Location: [facility] OR;  Service: Orthopedics;  Laterality: Left;   BLADDER SUSPENSION     COLONOSCOPY WITH PROPOFOL  N/A 05/11/2022   Procedure: COLONOSCOPY WITH PROPOFOL ;  Surgeon: Therisa Bi, MD;  Location: [facility] ENDOSCOPY;  Service: Gastroenterology;  Laterality: N/A;   ESOPHAGOGASTRODUODENOSCOPY (EGD) WITH PROPOFOL  N/A 05/11/2022   Procedure: ESOPHAGOGASTRODUODENOSCOPY (EGD) WITH PROPOFOL ;  Surgeon: Therisa Bi, MD;  Location: [facility] ENDOSCOPY;  Service: Gastroenterology;  Laterality: N/A;   EXPLORATORY LAPAROTOMY     IUD REMOVAL  07/25/2017   Procedure: INTRAUTERINE DEVICE (IUD) REMOVAL;  Surgeon: Janit Alm Agent, MD;  Location: [facility] ORS;  Service: Gynecology;;   LAPAROSCOPIC ASSISTED VAGINAL HYSTERECTOMY Bilateral 07/25/2017   Procedure: LAPAROSCOPIC  ASSISTED VAGINAL HYSTERECTOMY WITH BILATERAL SALPING OOPHERECTOMY;  Surgeon: Janit Alm Agent, MD;  Location: [facility] ORS;  Service: Gynecology;  Laterality: Bilateral;   POSTERIOR LUMBAR FUSION 2 WITH HARDWARE REMOVAL Left 10/18/2023   Procedure: ARTHROSCOPY, SHOULDER WITH DEBRIDEMENT, LEFT;  Surgeon: Addie Cordella Hamilton, MD;  Location: [facility] OR;  Service: Orthopedics;  Laterality: Left;  left shoulder arthroscopy, revision rotator cuff tear repair, possible cuff mend   SHOULDER ARTHROSCOPY WITH OPEN ROTATOR CUFF REPAIR AND DISTAL CLAVICLE ACROMINECTOMY Left 01/27/2023   Procedure: LEFT SHOULDER ARTHROSCOPY, DEBRIDEMENT, DISTAL CLAVICLE EXCISION, MINI OPEN ROTATOR CUFF TEAR REPAIR;  Surgeon: Addie Cordella Hamilton, MD;  Location: [facility] OR;  Service: Orthopedics;  Laterality: Left;   SHOULDER OPEN ROTATOR CUFF REPAIR Left 10/18/2023   Procedure: REPAIR, ROTATOR CUFF, OPEN;  Surgeon: Addie Cordella Hamilton, MD;  Location: [facility] OR;  Service: Orthopedics;  Laterality: Left;   TUBAL LIGATION     Patient Active Problem List   Diagnosis Date Noted   Synovitis of left shoulder 11/06/2023   Acute cystitis with hematuria 08/04/2023   Sepsis secondary to UTI (HCC) 08/01/2023   Obesity (BMI 30-39.9) 08/01/2023   Vertigo 02/25/2023   Complete tear of left rotator cuff 02/08/2023   Biceps tendonitis, left 02/08/2023   Arthritis of left acromioclavicular joint 02/08/2023   Memory change 12/06/2022   Excessive daytime sleepiness 12/06/2022   Prediabetes 07/29/2022   Persistent cough for 3 weeks or longer 01/21/2022  Iron  deficiency anemia 11/30/2021   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) 11/27/2021   Vitamin D  deficiency 11/27/2021   Chronic fatigue 11/27/2021   Paranoid delusion (HCC) 11/27/2021   GERD (gastroesophageal reflux disease) 04/25/2019   Hyperlipidemia, mixed 03/02/2019   Benign essential HTN 02/18/2019   Recurrent cystitis 11/27/2012   Intractable migraine  with status migrainosus 09/19/2011   Chronic constipation 06/25/2011    PCP: Gretta Comer POUR, NP  REFERRING PROVIDER: Addie Cordella Hamilton, MD  REFERRING DIAG: [phone] (ICD-10-CM) - Complete tear of left rotator cuff, unspecified whether traumatic  Rationale for Evaluation and Treatment: Rehabilitation  THERAPY DIAG:  Localized edema - Plan: PT plan of care cert/re-cert  Muscle weakness (generalized) - Plan: PT plan of care cert/re-cert  Stiffness of left shoulder, not elsewhere classified - Plan: PT plan of care cert/re-cert  Chronic left shoulder pain - Plan: PT plan of care cert/re-cert  Abnormal posture - Plan: PT plan of care cert/re-cert  ONSET DATE: dos: 10/18/23   SUBJECTIVE:                                                                                                                                                                                           SUBJECTIVE STATEMENT: Can't go back to work for 6 hours so they aren't letting pt return until 11/13   PERTINENT HISTORY:  Pt is s/p Lt RTC revision on 10/18/23. She is no longer using her sling. Anemia, migraines, depression, HTN, recent sepsis hospitalization, Lt RTC repair 10/24  PAIN:  NPRS scale: no pain upon arrival Pain location: Lt shoulder Pain description: aching, throbbing Aggravating factors: accidentally moving arm Relieving factors: tylenol , oxy at night  PRECAUTIONS:  Shoulder and Other: PROM only until 11/30/23  RED FLAGS: None   WEIGHT BEARING RESTRICTIONS:  No  FALLS:  Has patient fallen in last 6 months? No  LIVING ENVIRONMENT: Lives with: lives with their spouse and lives with an adult companion Lives in: House/apartment  OCCUPATION:  Mudlogger  PLOF:  Independent and Leisure: spend time with grandchild  PATIENT GOALS:  Use arm without pain, regain function in arm   OBJECTIVE:  Note: Objective measures were completed at Evaluation unless otherwise  noted.   PATIENT SURVEYS:  Patient-Specific Activity Scoring Scheme  0 represents "unable to perform." 10 represents "able to perform at prior level. 0 1 2 3 4 5 6 7 8 9  10 (Date and Score)   Activity Eval  01/13/24  02/06/24  1. Brushing hair 3   6 10   2. Working 0  0 8  3. UB dressing 4 6  8  4.Wash hair 3 6 9   Score 2.5 4.5 8.75   Total score = sum of the activity scores/number of activities Minimum detectable change (90%CI) for average score = 2 points Minimum detectable change (90%CI) for single activity score = 3 points    COGNITIVE STATUS: History of cognitive impairments - at baseline   SENSATION: WFL  POSTURE:  rounded shoulders and forward head  HAND DOMINANCE:  Right  GAIT: 11/09/23 Comments: independent   EDEMA: 11/09/23 Not formally measured but does appear to have some swelling in Lt shoulder and UE   UPPER EXTREMITY ROM:  ROM Right eval Left eval Left 11/15/23 Left 12/07/23 Left 12/14/2023 Left 01/05/24 Left 02/06/24  Shoulder flexion  P: 116 P: 115 A: 132 (supine) A: 114 (standing) 134 AROM in standing A: 132  (Sitting) A: 147 (Sitting)  Shoulder abduction  P: 86 P: 100 A: 90 (sidelying) A: 85 (standing)  A: 108  (sitting) A: 140 (Sitting)  Shoulder internal rotation  P: 60 in 45 deg abd (limited by abdomen)     T8  Shoulder external rotation  P: 45 in 45 deg abduction  A: 60 (standing)   A: 80 (Sitting)   (Blank rows = not tested)   UPPER EXTREMITY MMT:  11/09/23: Deferred due to post-op status and precautions  MMT Left 12/14/2023 Left 02/06/24  Shoulder flexion 4/5 3+/5  Shoulder abduction  3/5  Shoulder internal rotation  5/5  Shoulder external rotation  4/5   (Blank rows = not tested)                 TREATMENT 02/06/24 TherAct UBE L4 x 8 min (4 min each direction) ROM and MMT - see above for details Reviewed HEP and discussed recommended exercises to focus on at this time; discussed progression to build strength in Lt  shoulder - pt verbalized understanding    01/30/24 TherAct Pulleys x 3 min flexion and scaption UBE L4 x 8 min (4 min each direction) Work simulated IR with slight horizontal adduction 3x10; L4 band on Lt Work simulated chest press with horizontal adduction cross body 3x10, L4 band on Lt Reaching with horizontal abduction/adduction completing 360 deg with 1# in LUE; 1:30, 1:10, 1:50 Standing flexion 10x 20 sec and abduction 5x10 sec on Lt 1# DB   01/23/24 TherExercises:  Pulleys: x 2 minutes flexion and abduction TherAct UBE L3 x 8 min (alt fwd/bwd every 2 min) Standing shoulder flexion bil UE's 2# 2 x 15  Standing shoulder abduction bil 2# 2 x 15  Standing chest press 3 # bar x 15  Rows L4 band  2 x 15 holding 3 sec scapular squeeze Shoulder Ext L4 band 2 x 15 c 3  sec hold Shoulder extension bil UE's c 3 # bar  Standing shoulder flexion: L2 band 2 x 10 (flexion to 80 deg) Pushing cart with 20# weight for 100 feet  c bil UE's Standing with elbow against yellow physio ball performing left  shoulder abd 75 deg 2 x 10  NMR:  Scapular stabilization shoulder at 90 deg ball presses and circles x 20 both directions   01/17/24 TherAct UBE L3 x 8 min (alt fwd/bwd every 2 min) Standing shoulder flexion on Lt 2# 3x10 Standing shoulder abduction on Lt 2# 3x10 Standing chest press 1# on Lt 3x10 Rows L4 band 3x10; 5 sec hold Shoulder ext L4 band 3x10; 5 sec hold Sidelying ER 2# 3x10 on Lt Prone row on Lt 2# 3x10  01/13/24 TherAct Pulleys flexion and abduction x3 min each Rows L4 band 3x10; 5 sec hold Shoulder ext L4 band 3x10; 5 sec hold ER Lt L3 band 2x15 IR L3 band Lt 2x15 Lt shoulder flexion 2# 3x10 to 90 deg Standing Lt shoulder abduction 2# 3x10 to 90 deg Supine shoulder flexion with 2# to end range with 10 sec hold for motion x10 reps Sidelying shoulder abduction with 1# to end range with 10 sec hold for motion x10 reps Sidelying ER on Lt 3x10; 2#    PATIENT  EDUCATION:  Education details: HEP Person educated: Patient Education method: Programmer, Multimedia, Facilities Manager, and Handouts Education comprehension: verbalized understanding, returned demonstration, and needs further education  HOME EXERCISE PROGRAM: Access Code: [email or web address] URL: [email or web address] Date: 01/17/2024 Prepared by: Corean Ku  Exercises - Standing shoulder flexion wall slides  - 1-2 x daily - 7 x weekly - 1-2 sets - 10 reps - 5 hold - Standing Bilateral Low Shoulder Row with Anchored Resistance  - 1-2 x daily - 7 x weekly - 2-3 sets - 10-15 reps - Shoulder Extension with Resistance  - 1-2 x daily - 7 x weekly - 1-2 sets - 10-15 reps - Single Arm Shoulder Flexion with Dumbbell  - 1 x daily - 7 x weekly - 3 sets - 10 reps - Standing Single Arm Shoulder Abduction with Dumbbell - Thumb Up  - 1 x daily - 7 x weekly - 3 sets - 10 reps - Standing Chest Press with Dumbbells  - 1 x daily - 7 x weekly - 3 sets - 10 reps - Sidelying Shoulder ER with Towel and Dumbbell (Mirrored)  - 1 x daily - 7 x weekly - 3 sets - 10 reps  ASSESSMENT:  CLINICAL IMPRESSION: Pt has met/partially met all LTGs at this time and is ready to d/c from PT.  She is pleased with her progress at this time and will be returning to work in a couple of weeks.  Will d/c PT.    OBJECTIVE IMPAIRMENTS: decreased coordination, decreased ROM, decreased strength, increased edema, increased fascial restrictions, increased muscle spasms, impaired UE functional use, and pain.   ACTIVITY LIMITATIONS: carrying, lifting, sleeping, bed mobility, bathing, toileting, dressing, reach over head, and hygiene/grooming  PARTICIPATION LIMITATIONS: meal prep, cleaning, laundry, driving, shopping, community activity, and occupation  PERSONAL FACTORS: Age, Past/current experiences, Time since onset of injury/illness/exacerbation, and 3+ comorbidities: Anemia, migraines, depression, HTN, recent sepsis hospitalization, Lt  RTC repair 10/24 are also affecting patient's functional outcome.   REHAB POTENTIAL: Good  CLINICAL DECISION MAKING: Evolving/moderate complexity  EVALUATION COMPLEXITY: Moderate   GOALS: Goals reviewed with patient? Yes  SHORT TERM GOALS: Target date: 12/07/2023  Independent with initial HEP Goal status: MET 01/13/24  2.  Lt shoulder PROM improved 15 deg all motions for improved mobility Goal status: met  3.  Report pain < 4/10 in Lt shoulder  Goal status: MET 01/13/24   LONG TERM GOALS: Target date: 02/01/2024  Independent with final HEP Goal status:  MET 02/06/24  2.  PSFS score improved by 3 points Goal status: MET 02/06/24  3.  Lt shoulder AROM improved to [facility] for improved function and strength Goal status: MET 02/06/24  4.  Report pain < 2/10 with reaching and ADLs for improved function Goal status: MET 02/06/24  5.  Demonstrate at least 4/5 Lt shoulder strength in order to safety return to work Goal status: Partially MET 02/06/24  6.  Perform work simulated activities without increase in pain  to safely return to work Goal status: MET (at prior session) 02/06/24     PLAN:  PT FREQUENCY: 1x/wk x 3 weeks, then 2x/wk x 9 weeks  PT DURATION: 12 weeks  PLANNED INTERVENTIONS: 97164- PT Re-evaluation, 97750- Physical Performance Testing, 97110-Therapeutic exercises, 97530- Therapeutic activity, 97112- Neuromuscular re-education, 97535- Self Care, 02859- Manual therapy, [id number]- Aquatic Therapy, H9716- Electrical stimulation (unattended), 97016- Vasopneumatic device, [id number]- Ultrasound, [id number]- Ionotophoresis 4mg /ml Dexamethasone , 79439 (1-2 muscles), 20561 (3+ muscles)- Dry Needling, Patient/Family education, Taping, Joint mobilization, Cryotherapy, and Moist heat.  PLAN FOR NEXT SESSION: D/c PT today   PRECAUTIONS: full ROM and strengthening allowed starting 11/29/23   NEXT MD VISIT: PRN   Corean JULIANNA Ku, PT, DPT 02/06/24 12:06 PM    PHYSICAL THERAPY  DISCHARGE SUMMARY  Visits from Start of Care: 12  Current functional level related to goals / functional outcomes: See above   Remaining deficits: See above   Education / Equipment: HEP   Patient agrees to discharge. Patient goals were partially met. Patient is being discharged due to being pleased with the current functional level.    Corean JULIANNA Ku, PT, DPT 02/06/24 12:06 PM  [facility] [address] [place], [place], [address] Phone: [phone]   Fax:  [phone]

## 2024-02-13 ENCOUNTER — Encounter: Payer: Self-pay | Admitting: Radiology

## 2024-02-27 ENCOUNTER — Inpatient Hospital Stay: Attending: Nurse Practitioner

## 2024-02-27 DIAGNOSIS — D509 Iron deficiency anemia, unspecified: Secondary | ICD-10-CM | POA: Insufficient documentation

## 2024-02-27 DIAGNOSIS — K209 Esophagitis, unspecified without bleeding: Secondary | ICD-10-CM | POA: Insufficient documentation

## 2024-02-27 LAB — CBC WITH DIFFERENTIAL (CANCER CENTER ONLY)
Abs Immature Granulocytes: 0 K/uL (ref 0.00–0.07)
Basophils Absolute: 0 K/uL (ref 0.0–0.1)
Basophils Relative: 1 %
Eosinophils Absolute: 0.3 K/uL (ref 0.0–0.5)
Eosinophils Relative: 8 %
HCT: 37.3 % (ref 36.0–46.0)
Hemoglobin: 12.3 g/dL (ref 12.0–15.0)
Immature Granulocytes: 0 %
Lymphocytes Relative: 22 %
Lymphs Abs: 0.9 K/uL (ref 0.7–4.0)
MCH: 29.7 pg (ref 26.0–34.0)
MCHC: 33 g/dL (ref 30.0–36.0)
MCV: 90.1 fL (ref 80.0–100.0)
Monocytes Absolute: 0.3 K/uL (ref 0.1–1.0)
Monocytes Relative: 8 %
Neutro Abs: 2.5 K/uL (ref 1.7–7.7)
Neutrophils Relative %: 61 %
Platelet Count: 256 K/uL (ref 150–400)
RBC: 4.14 MIL/uL (ref 3.87–5.11)
RDW: 14.3 % (ref 11.5–15.5)
WBC Count: 4.1 K/uL (ref 4.0–10.5)
nRBC: 0 % (ref 0.0–0.2)

## 2024-02-27 LAB — BASIC METABOLIC PANEL - CANCER CENTER ONLY
Anion gap: 9 (ref 5–15)
BUN: 17 mg/dL (ref 6–20)
CO2: 24 mmol/L (ref 22–32)
Calcium: 8.8 mg/dL — ABNORMAL LOW (ref 8.9–10.3)
Chloride: 106 mmol/L (ref 98–111)
Creatinine: 0.67 mg/dL (ref 0.44–1.00)
GFR, Estimated: >60 mL/min (ref >60)
Glucose, Bld: 110 mg/dL — ABNORMAL HIGH (ref 70–99)
Potassium: 3.8 mmol/L (ref 3.5–5.1)
Sodium: 139 mmol/L (ref 135–145)

## 2024-02-27 LAB — IRON AND TIBC
Iron: 31 ug/dL (ref 28–170)
Saturation Ratios: 8 % — ABNORMAL LOW (ref 10.4–31.8)
TIBC: 391 ug/dL (ref 250–450)
UIBC: 360 ug/dL

## 2024-02-27 LAB — FERRITIN: Ferritin: 18 ng/mL (ref 11–307)

## 2024-03-01 ENCOUNTER — Telehealth: Payer: Self-pay | Admitting: Oncology

## 2024-03-01 NOTE — Telephone Encounter (Signed)
 I called the pt to reschedule her appts with Tinnie Dawn, no answer so I left a vm with the new appt date and time.

## 2024-03-05 ENCOUNTER — Inpatient Hospital Stay: Admitting: Nurse Practitioner

## 2024-03-05 ENCOUNTER — Ambulatory Visit: Admitting: Oncology

## 2024-03-05 ENCOUNTER — Ambulatory Visit

## 2024-03-05 ENCOUNTER — Inpatient Hospital Stay

## 2024-03-12 ENCOUNTER — Encounter: Payer: Self-pay | Admitting: Oncology

## 2024-03-12 ENCOUNTER — Inpatient Hospital Stay

## 2024-03-12 ENCOUNTER — Inpatient Hospital Stay: Attending: Nurse Practitioner | Admitting: Oncology

## 2024-03-12 VITALS — BP 125/85

## 2024-03-12 VITALS — BP 123/86 | HR 87 | Temp 97.6°F | Resp 17 | Wt 164.4 lb

## 2024-03-12 DIAGNOSIS — D509 Iron deficiency anemia, unspecified: Secondary | ICD-10-CM

## 2024-03-12 MED ORDER — IRON SUCROSE 20 MG/ML IV SOLN
200.0000 mg | Freq: Once | INTRAVENOUS | Status: AC
  Administered 2024-03-12: 200 mg via INTRAVENOUS
  Filled 2024-03-12: qty 10

## 2024-03-12 NOTE — Progress Notes (Signed)
 [facility]  [phone] [phone]  ID: [name] OB: 06-30-1965  MR#: [id number]  RDW#:[id number]  Patient Care Team: Gretta Comer POUR, NP as PCP - General (Internal Medicine) Neda Jennet LABOR, MD as Consulting Physician (Pulmonary Disease) Jacobo Evalene PARAS, MD as Consulting Physician (Oncology)  CHIEF COMPLAINT: Iron  deficiency anemia.  INTERVAL HISTORY: Patient returns to clinic today for repeat laboratory, further evaluation, and consideration of additional IV Venofer .  She continues to have mild fatigue, but overall feels improved since receiving treatment several months ago.  She otherwise feels well.  She has no neurologic complaints.  She denies any recent fevers or illnesses.  She has good appetite and denies weight loss.  She has no chest pain, shortness of breath, cough, or hemoptysis.  She denies any nausea, vomiting, constipation, or diarrhea.  She has no melena or hematochezia.  She has no urinary complaints.  Patient offers no further specific complaints today.  REVIEW OF SYSTEMS:   Review of Systems  Constitutional:  Positive for malaise/fatigue. Negative for fever and weight loss.  Respiratory: Negative.  Negative for cough, hemoptysis and shortness of breath.   Cardiovascular: Negative.  Negative for chest pain and leg swelling.  Gastrointestinal: Negative.  Negative for abdominal pain, blood in stool and melena.  Genitourinary: Negative.  Negative for dysuria.  Musculoskeletal: Negative.  Negative for back pain.  Skin: Negative.  Negative for rash.  Neurological: Negative.  Negative for dizziness, focal weakness, weakness and headaches.  Psychiatric/Behavioral: Negative.  The patient is not nervous/anxious.     As per HPI. Otherwise, a complete review of systems is negative.  PAST MEDICAL HISTORY: Past Medical History:  Diagnosis Date   Anemia    BV (bacterial vaginosis) 11/27/2012   Celiac disease    Cough due to  ACE inhibitor 04/25/2019   Depression    Essential hypertension    Family history of adverse reaction to anesthesia    MOM-HARD TIME WAKING UP   GERD (gastroesophageal reflux disease)    Migraine with visual aura    MIGRAINES   UTI (lower urinary tract infection)     PAST SURGICAL HISTORY: Past Surgical History:  Procedure Laterality Date   ABDOMINAL HYSTERECTOMY     BICEPT TENODESIS Left 01/27/2023   Procedure: BICEPS TENODESIS;  Surgeon: Addie Cordella Hamilton, MD;  Location: [facility] OR;  Service: Orthopedics;  Laterality: Left;   BLADDER SUSPENSION     COLONOSCOPY WITH PROPOFOL  N/A 05/11/2022   Procedure: COLONOSCOPY WITH PROPOFOL ;  Surgeon: Therisa Bi, MD;  Location: [facility] ENDOSCOPY;  Service: Gastroenterology;  Laterality: N/A;   ESOPHAGOGASTRODUODENOSCOPY (EGD) WITH PROPOFOL  N/A 05/11/2022   Procedure: ESOPHAGOGASTRODUODENOSCOPY (EGD) WITH PROPOFOL ;  Surgeon: Therisa Bi, MD;  Location: [facility] ENDOSCOPY;  Service: Gastroenterology;  Laterality: N/A;   EXPLORATORY LAPAROTOMY     IUD REMOVAL  07/25/2017   Procedure: INTRAUTERINE DEVICE (IUD) REMOVAL;  Surgeon: Janit Alm Agent, MD;  Location: [facility] ORS;  Service: Gynecology;;   LAPAROSCOPIC ASSISTED VAGINAL HYSTERECTOMY Bilateral 07/25/2017   Procedure: LAPAROSCOPIC ASSISTED VAGINAL HYSTERECTOMY WITH BILATERAL SALPING OOPHERECTOMY;  Surgeon: Janit Alm Agent, MD;  Location: [facility] ORS;  Service: Gynecology;  Laterality: Bilateral;   POSTERIOR LUMBAR FUSION 2 WITH HARDWARE REMOVAL Left 10/18/2023   Procedure: ARTHROSCOPY, SHOULDER WITH DEBRIDEMENT, LEFT;  Surgeon: Addie Cordella Hamilton, MD;  Location: [facility] OR;  Service: Orthopedics;  Laterality: Left;  left shoulder arthroscopy, revision rotator cuff tear repair, possible cuff mend   SHOULDER ARTHROSCOPY WITH OPEN ROTATOR CUFF REPAIR AND DISTAL CLAVICLE ACROMINECTOMY  Left 01/27/2023   Procedure: LEFT SHOULDER ARTHROSCOPY, DEBRIDEMENT, DISTAL CLAVICLE EXCISION, MINI OPEN ROTATOR CUFF TEAR REPAIR;   Surgeon: Addie Cordella Hamilton, MD;  Location: [facility] OR;  Service: Orthopedics;  Laterality: Left;   SHOULDER OPEN ROTATOR CUFF REPAIR Left 10/18/2023   Procedure: REPAIR, ROTATOR CUFF, OPEN;  Surgeon: Addie Cordella Hamilton, MD;  Location: [facility] OR;  Service: Orthopedics;  Laterality: Left;   TUBAL LIGATION      FAMILY HISTORY: Family History  Problem Relation Age of Onset   Hypertension Mother    Stroke Mother    Cancer Father    Breast cancer Maternal Aunt    Hypertension Maternal Grandmother    Colon cancer Maternal Grandmother    Breast cancer Maternal Grandmother    Skin cancer Maternal Grandmother    Hypertension Maternal Grandfather    Hypertension Paternal Grandmother    Diabetes Paternal Grandmother    Hypertension Paternal Grandfather    Diabetes Son 12       type 1   Breast cancer Other     ADVANCED DIRECTIVES (Y/N):  N  HEALTH MAINTENANCE: Social History   Tobacco Use   Smoking status: Never   Smokeless tobacco: Never  Vaping Use   Vaping status: Never Used  Substance Use Topics   Alcohol use: No    Alcohol/week: 0.0 standard drinks of alcohol   Drug use: No     Colonoscopy:  PAP:  Bone density:  Lipid panel:  Allergies  Allergen Reactions   Topamax  [Topiramate ]     cant function    Tramadol      Cant function with it    Gluten Meal Diarrhea and Nausea Only        Ondansetron  Other (See Comments)    Helps her nausea, but makes HA worse    Current Outpatient Medications  Medication Sig Dispense Refill   celecoxib  (CELEBREX ) 100 MG capsule TAKE 1 CAPSULE BY MOUTH EVERY DAY 30 capsule 0   ferrous sulfate  325 (65 FE) MG tablet Take 1 tablet (325 mg total) by mouth daily with breakfast. 30 tablet 2   methocarbamol  (ROBAXIN ) 500 MG tablet Take 1 tablet (500 mg total) by mouth every 8 (eight) hours as needed for muscle spasms. 30 tablet 0   omeprazole  (PRILOSEC) 20 MG capsule Take 20 mg by mouth daily.     oxycodone  (OXY-IR) 5 MG capsule Take 1 capsule (5  mg total) by mouth every 12 (twelve) hours as needed. (Patient not taking: Reported on 03/12/2024) 30 capsule 0   No current facility-administered medications for this visit.    OBJECTIVE: Vitals:   03/12/24 1011  BP: 123/86  Pulse: 87  Resp: 17  Temp: 97.6 F (36.4 C)  SpO2: 97%     Body mass index is 32.11 kg/m.    ECOG FS:0 - Asymptomatic  General: Well-developed, well-nourished, no acute distress. Eyes: Pink conjunctiva, anicteric sclera. HEENT: Normocephalic, moist mucous membranes. Lungs: No audible wheezing or coughing. Heart: Regular rate and rhythm. Abdomen: Soft, nontender, no obvious distention. Musculoskeletal: No edema, cyanosis, or clubbing. Neuro: Alert, answering all questions appropriately. Cranial nerves grossly intact. Skin: No rashes or petechiae noted. Psych: Normal affect. Lymphatics: No cervical, calvicular, axillary or inguinal LAD.   LAB RESULTS:  Lab Results  Component Value Date   NA 139 02/27/2024   K 3.8 02/27/2024   CL 106 02/27/2024   CO2 24 02/27/2024   GLUCOSE 110 (H) 02/27/2024   BUN 17 02/27/2024   CREATININE 0.67 02/27/2024  CALCIUM  8.8 (L) 02/27/2024   PROT 6.7 08/01/2023   ALBUMIN  2.8 (L) 08/01/2023   AST 37 08/01/2023   ALT 25 08/01/2023   ALKPHOS 80 08/01/2023   BILITOT 0.6 08/01/2023   GFRNONAA >60 02/27/2024   GFRAA >60 04/09/2019    Lab Results  Component Value Date   WBC 4.1 02/27/2024   NEUTROABS 2.5 02/27/2024   HGB 12.3 02/27/2024   HCT 37.3 02/27/2024   MCV 90.1 02/27/2024   PLT 256 02/27/2024   Lab Results  Component Value Date   IRON  31 02/27/2024   TIBC 391 02/27/2024   IRONPCTSAT 8 (L) 02/27/2024   Lab Results  Component Value Date   FERRITIN 18 02/27/2024     STUDIES: No results found.  ASSESSMENT: Iron  deficiency anemia.  PLAN:    Iron  deficiency anemia: Colonoscopy completed on May 11, 2022 revealed multiple diverticulosis, but no other significant pathology.  Upper endoscopy on  the same date revealed esophagitis.  Patient's hemoglobin has improved and is now within normal limits at 12.3.  Iron  panel is now also within normal limits except for a decreased saturation ratio of 8%.  She was instructed to continue oral iron  supplementation.  Proceed with 200 mg IV Venofer  today.  Return to clinic later this week for second infusion.  No further treatments are needed.  Return to clinic in 4 months with repeat laboratory, evaluation by APP, and consideration of additional IV Venofer  if necessary. Esophagitis: Continue omeprazole .  Follow-up with GI as indicated.  I spent a total of 30 minutes reviewing chart data, face-to-face evaluation with the patient, counseling and coordination of care as detailed above.   Patient expressed understanding and was in agreement with this plan. She also understands that She can call clinic at any time with any questions, concerns, or complaints.     Evalene JINNY Reusing, MD   03/12/2024 10:31 AM

## 2024-03-15 ENCOUNTER — Inpatient Hospital Stay

## 2024-03-15 VITALS — BP 140/90 | HR 67 | Temp 97.6°F | Resp 16

## 2024-03-15 DIAGNOSIS — D509 Iron deficiency anemia, unspecified: Secondary | ICD-10-CM | POA: Diagnosis not present

## 2024-03-15 MED ORDER — IRON SUCROSE 20 MG/ML IV SOLN
200.0000 mg | Freq: Once | INTRAVENOUS | Status: AC
  Administered 2024-03-15: 200 mg via INTRAVENOUS
  Filled 2024-03-15: qty 10

## 2024-03-15 NOTE — Patient Instructions (Signed)

## 2024-04-17 ENCOUNTER — Encounter (HOSPITAL_COMMUNITY): Payer: Self-pay | Admitting: Orthopedic Surgery

## 2024-07-23 ENCOUNTER — Inpatient Hospital Stay

## 2024-07-24 ENCOUNTER — Inpatient Hospital Stay: Admitting: Nurse Practitioner

## 2024-07-24 ENCOUNTER — Inpatient Hospital Stay
